# Patient Record
Sex: Female | Born: 1940
Health system: Southern US, Community
[De-identification: ages and names within clinical notes are randomized; demographics above are authoritative.]

## PROBLEM LIST (undated history)

## (undated) ENCOUNTER — Emergency Department (HOSPITAL_COMMUNITY): Disposition: A | Discharge status: Home / Self Care | Payer: Medicare HMO

## (undated) ENCOUNTER — Ambulatory Visit (HOSPITAL_COMMUNITY): Admission: EM | Source: Home / Self Care

## (undated) DIAGNOSIS — N390 Urinary tract infection, site not specified: Secondary | ICD-10-CM

## (undated) DIAGNOSIS — F329 Major depressive disorder, single episode, unspecified: Secondary | ICD-10-CM

## (undated) DIAGNOSIS — C50919 Malignant neoplasm of unspecified site of unspecified female breast: Secondary | ICD-10-CM

## (undated) DIAGNOSIS — E785 Hyperlipidemia, unspecified: Secondary | ICD-10-CM

## (undated) DIAGNOSIS — J189 Pneumonia, unspecified organism: Secondary | ICD-10-CM

## (undated) DIAGNOSIS — F32A Depression, unspecified: Secondary | ICD-10-CM

## (undated) DIAGNOSIS — E538 Deficiency of other specified B group vitamins: Secondary | ICD-10-CM

## (undated) DIAGNOSIS — E559 Vitamin D deficiency, unspecified: Secondary | ICD-10-CM

## (undated) DIAGNOSIS — C801 Malignant (primary) neoplasm, unspecified: Secondary | ICD-10-CM

## (undated) DIAGNOSIS — N343 Urethral syndrome, unspecified: Secondary | ICD-10-CM

## (undated) DIAGNOSIS — F4323 Adjustment disorder with mixed anxiety and depressed mood: Secondary | ICD-10-CM

## (undated) DIAGNOSIS — K8020 Calculus of gallbladder without cholecystitis without obstruction: Secondary | ICD-10-CM

## (undated) DIAGNOSIS — H269 Unspecified cataract: Secondary | ICD-10-CM

## (undated) DIAGNOSIS — E039 Hypothyroidism, unspecified: Secondary | ICD-10-CM

## (undated) DIAGNOSIS — R413 Other amnesia: Secondary | ICD-10-CM

## (undated) DIAGNOSIS — E739 Lactose intolerance, unspecified: Secondary | ICD-10-CM

## (undated) DIAGNOSIS — K219 Gastro-esophageal reflux disease without esophagitis: Secondary | ICD-10-CM

## (undated) DIAGNOSIS — B029 Zoster without complications: Secondary | ICD-10-CM

## (undated) DIAGNOSIS — IMO0001 Reserved for inherently not codable concepts without codable children: Secondary | ICD-10-CM

## (undated) DIAGNOSIS — M7500 Adhesive capsulitis of unspecified shoulder: Secondary | ICD-10-CM

## (undated) DIAGNOSIS — Z923 Personal history of irradiation: Secondary | ICD-10-CM

## (undated) DIAGNOSIS — Z973 Presence of spectacles and contact lenses: Secondary | ICD-10-CM

## (undated) DIAGNOSIS — M8580 Other specified disorders of bone density and structure, unspecified site: Secondary | ICD-10-CM

## (undated) HISTORY — DX: Hyperlipidemia, unspecified: E78.5

## (undated) HISTORY — PX: MULTIPLE TOOTH EXTRACTIONS: SHX2053

## (undated) HISTORY — DX: Major depressive disorder, single episode, unspecified: F32.9

## (undated) HISTORY — DX: Deficiency of other specified B group vitamins: E53.8

## (undated) HISTORY — PX: COLONOSCOPY: SHX174

## (undated) HISTORY — DX: Hypothyroidism, unspecified: E03.9

## (undated) HISTORY — DX: Depression, unspecified: F32.A

## (undated) HISTORY — DX: Lactose intolerance, unspecified: E73.9

## (undated) HISTORY — DX: Adhesive capsulitis of unspecified shoulder: M75.00

## (undated) HISTORY — DX: Adjustment disorder with mixed anxiety and depressed mood: F43.23

## (undated) HISTORY — DX: Vitamin D deficiency, unspecified: E55.9

## (undated) HISTORY — DX: Gastro-esophageal reflux disease without esophagitis: K21.9

## (undated) HISTORY — DX: Malignant (primary) neoplasm, unspecified: C80.1

## (undated) HISTORY — DX: Other specified disorders of bone density and structure, unspecified site: M85.80

## (undated) HISTORY — DX: Zoster without complications: B02.9

## (undated) HISTORY — DX: Urethral syndrome, unspecified: N34.3

## (undated) HISTORY — DX: Other amnesia: R41.3

---

## 1997-09-01 ENCOUNTER — Ambulatory Visit (HOSPITAL_COMMUNITY): Admission: RE | Admit: 1997-09-01 | Discharge: 1997-09-01 | Payer: Self-pay | Admitting: Gynecology

## 1998-03-05 DIAGNOSIS — C801 Malignant (primary) neoplasm, unspecified: Secondary | ICD-10-CM

## 1998-03-05 HISTORY — PX: BREAST LUMPECTOMY: SHX2

## 1998-03-05 HISTORY — DX: Malignant (primary) neoplasm, unspecified: C80.1

## 1998-07-13 ENCOUNTER — Other Ambulatory Visit: Admission: RE | Admit: 1998-07-13 | Discharge: 1998-07-13 | Payer: Self-pay | Admitting: Gynecology

## 1998-09-16 ENCOUNTER — Ambulatory Visit (HOSPITAL_COMMUNITY): Admission: RE | Admit: 1998-09-16 | Discharge: 1998-09-16 | Payer: Self-pay | Admitting: Gynecology

## 1998-09-16 ENCOUNTER — Encounter: Payer: Self-pay | Admitting: Gynecology

## 1998-09-23 ENCOUNTER — Ambulatory Visit (HOSPITAL_COMMUNITY): Admission: RE | Admit: 1998-09-23 | Discharge: 1998-09-23 | Payer: Self-pay | Admitting: Gynecology

## 1998-10-06 ENCOUNTER — Encounter: Payer: Self-pay | Admitting: Surgery

## 1998-10-06 ENCOUNTER — Ambulatory Visit (HOSPITAL_BASED_OUTPATIENT_CLINIC_OR_DEPARTMENT_OTHER): Admission: RE | Admit: 1998-10-06 | Discharge: 1998-10-06 | Payer: Self-pay | Admitting: Surgery

## 1998-10-12 ENCOUNTER — Encounter: Admission: RE | Admit: 1998-10-12 | Discharge: 1999-01-10 | Payer: Self-pay | Admitting: Radiation Oncology

## 1998-10-31 ENCOUNTER — Encounter: Payer: Self-pay | Admitting: Surgery

## 1998-10-31 ENCOUNTER — Ambulatory Visit (HOSPITAL_COMMUNITY): Admission: RE | Admit: 1998-10-31 | Discharge: 1998-10-31 | Payer: Self-pay | Admitting: Surgery

## 1998-12-29 ENCOUNTER — Encounter: Payer: Self-pay | Admitting: *Deleted

## 1998-12-29 ENCOUNTER — Ambulatory Visit (HOSPITAL_COMMUNITY): Admission: RE | Admit: 1998-12-29 | Discharge: 1998-12-29 | Payer: Self-pay | Admitting: *Deleted

## 1999-09-26 ENCOUNTER — Other Ambulatory Visit: Admission: RE | Admit: 1999-09-26 | Discharge: 1999-09-26 | Payer: Self-pay | Admitting: Gynecology

## 1999-09-28 ENCOUNTER — Encounter: Admission: RE | Admit: 1999-09-28 | Discharge: 1999-09-28 | Payer: Self-pay | Admitting: Gynecology

## 1999-09-28 ENCOUNTER — Encounter: Payer: Self-pay | Admitting: Gynecology

## 1999-11-29 ENCOUNTER — Encounter: Admission: RE | Admit: 1999-11-29 | Discharge: 1999-11-29 | Payer: Self-pay | Admitting: Family Medicine

## 1999-11-29 ENCOUNTER — Encounter: Payer: Self-pay | Admitting: Family Medicine

## 2000-10-01 ENCOUNTER — Encounter: Admission: RE | Admit: 2000-10-01 | Discharge: 2000-10-01 | Payer: Self-pay | Admitting: Gynecology

## 2000-10-01 ENCOUNTER — Encounter: Payer: Self-pay | Admitting: Gynecology

## 2000-10-21 ENCOUNTER — Ambulatory Visit (HOSPITAL_COMMUNITY): Admission: RE | Admit: 2000-10-21 | Discharge: 2000-10-21 | Payer: Self-pay | Admitting: Gastroenterology

## 2000-10-21 ENCOUNTER — Encounter (INDEPENDENT_AMBULATORY_CARE_PROVIDER_SITE_OTHER): Payer: Self-pay | Admitting: Specialist

## 2000-11-26 ENCOUNTER — Other Ambulatory Visit: Admission: RE | Admit: 2000-11-26 | Discharge: 2000-11-26 | Payer: Self-pay | Admitting: Gynecology

## 2001-10-09 ENCOUNTER — Encounter: Admission: RE | Admit: 2001-10-09 | Discharge: 2001-10-09 | Payer: Self-pay | Admitting: Gynecology

## 2001-10-09 ENCOUNTER — Encounter: Payer: Self-pay | Admitting: Gynecology

## 2001-12-03 ENCOUNTER — Other Ambulatory Visit: Admission: RE | Admit: 2001-12-03 | Discharge: 2001-12-03 | Payer: Self-pay | Admitting: Gynecology

## 2002-10-13 ENCOUNTER — Encounter: Admission: RE | Admit: 2002-10-13 | Discharge: 2002-10-13 | Payer: Self-pay | Admitting: Gynecology

## 2002-10-13 ENCOUNTER — Encounter: Payer: Self-pay | Admitting: Gynecology

## 2002-12-14 ENCOUNTER — Other Ambulatory Visit: Admission: RE | Admit: 2002-12-14 | Discharge: 2002-12-14 | Payer: Self-pay | Admitting: Gynecology

## 2003-04-28 ENCOUNTER — Encounter: Admission: RE | Admit: 2003-04-28 | Discharge: 2003-04-28 | Payer: Self-pay | Admitting: Family Medicine

## 2003-10-19 ENCOUNTER — Encounter: Admission: RE | Admit: 2003-10-19 | Discharge: 2003-10-19 | Payer: Self-pay | Admitting: Family Medicine

## 2003-10-20 ENCOUNTER — Encounter: Admission: RE | Admit: 2003-10-20 | Discharge: 2003-10-20 | Payer: Self-pay | Admitting: Surgery

## 2004-03-16 ENCOUNTER — Other Ambulatory Visit: Admission: RE | Admit: 2004-03-16 | Discharge: 2004-03-16 | Payer: Self-pay | Admitting: Gynecology

## 2004-07-07 ENCOUNTER — Encounter: Admission: RE | Admit: 2004-07-07 | Discharge: 2004-07-07 | Payer: Self-pay | Admitting: Family Medicine

## 2004-09-19 ENCOUNTER — Encounter: Admission: RE | Admit: 2004-09-19 | Discharge: 2004-09-19 | Payer: Self-pay | Admitting: Family Medicine

## 2004-10-25 ENCOUNTER — Encounter: Admission: RE | Admit: 2004-10-25 | Discharge: 2004-10-25 | Payer: Self-pay | Admitting: Surgery

## 2005-04-17 ENCOUNTER — Other Ambulatory Visit: Admission: RE | Admit: 2005-04-17 | Discharge: 2005-04-17 | Payer: Self-pay | Admitting: Gynecology

## 2005-05-31 ENCOUNTER — Encounter: Admission: RE | Admit: 2005-05-31 | Discharge: 2005-05-31 | Payer: Self-pay | Admitting: Family Medicine

## 2005-07-04 ENCOUNTER — Encounter: Payer: Self-pay | Admitting: Gynecology

## 2005-10-31 ENCOUNTER — Encounter: Admission: RE | Admit: 2005-10-31 | Discharge: 2005-10-31 | Payer: Self-pay | Admitting: Surgery

## 2006-11-27 ENCOUNTER — Encounter: Admission: RE | Admit: 2006-11-27 | Discharge: 2006-11-27 | Payer: Self-pay | Admitting: Surgery

## 2007-07-01 ENCOUNTER — Other Ambulatory Visit: Admission: RE | Admit: 2007-07-01 | Discharge: 2007-07-01 | Payer: Self-pay | Admitting: Internal Medicine

## 2007-07-02 ENCOUNTER — Encounter: Payer: Self-pay | Admitting: Family Medicine

## 2007-12-10 ENCOUNTER — Encounter: Admission: RE | Admit: 2007-12-10 | Discharge: 2007-12-10 | Payer: Self-pay | Admitting: Surgery

## 2007-12-12 ENCOUNTER — Ambulatory Visit: Payer: Self-pay | Admitting: Family Medicine

## 2007-12-12 DIAGNOSIS — K219 Gastro-esophageal reflux disease without esophagitis: Secondary | ICD-10-CM | POA: Insufficient documentation

## 2007-12-12 DIAGNOSIS — F4323 Adjustment disorder with mixed anxiety and depressed mood: Secondary | ICD-10-CM

## 2007-12-12 DIAGNOSIS — E785 Hyperlipidemia, unspecified: Secondary | ICD-10-CM | POA: Insufficient documentation

## 2007-12-12 DIAGNOSIS — E039 Hypothyroidism, unspecified: Secondary | ICD-10-CM | POA: Insufficient documentation

## 2007-12-12 DIAGNOSIS — M7500 Adhesive capsulitis of unspecified shoulder: Secondary | ICD-10-CM | POA: Insufficient documentation

## 2007-12-12 HISTORY — DX: Hypothyroidism, unspecified: E03.9

## 2007-12-12 HISTORY — DX: Adjustment disorder with mixed anxiety and depressed mood: F43.23

## 2007-12-12 HISTORY — DX: Gastro-esophageal reflux disease without esophagitis: K21.9

## 2008-01-12 ENCOUNTER — Ambulatory Visit: Payer: Self-pay | Admitting: Family Medicine

## 2008-01-15 ENCOUNTER — Ambulatory Visit: Payer: Self-pay | Admitting: Family Medicine

## 2008-01-15 DIAGNOSIS — R635 Abnormal weight gain: Secondary | ICD-10-CM | POA: Insufficient documentation

## 2008-01-15 DIAGNOSIS — N390 Urinary tract infection, site not specified: Secondary | ICD-10-CM | POA: Insufficient documentation

## 2008-01-15 LAB — CONVERTED CEMR LAB
AST: 30 units/L (ref 0–37)
Free T4: 0.8 ng/dL (ref 0.6–1.6)
T4, Total: 9.2 ug/dL (ref 5.0–12.5)
TSH: 0.08 microintl units/mL — ABNORMAL LOW (ref 0.35–5.50)

## 2008-03-11 ENCOUNTER — Encounter: Payer: Self-pay | Admitting: Family Medicine

## 2008-06-07 ENCOUNTER — Telehealth: Payer: Self-pay | Admitting: Family Medicine

## 2008-07-01 ENCOUNTER — Ambulatory Visit: Payer: Self-pay | Admitting: Family Medicine

## 2008-07-01 DIAGNOSIS — Z853 Personal history of malignant neoplasm of breast: Secondary | ICD-10-CM | POA: Insufficient documentation

## 2008-07-02 LAB — CONVERTED CEMR LAB
Cholesterol: 180 mg/dL (ref 0–200)
Free T4: 1.2 ng/dL (ref 0.6–1.6)
LDL Cholesterol: 112 mg/dL — ABNORMAL HIGH (ref 0–99)
TSH: 0.04 microintl units/mL — ABNORMAL LOW (ref 0.35–5.50)
Total CHOL/HDL Ratio: 5
VLDL: 29.4 mg/dL (ref 0.0–40.0)

## 2008-10-18 ENCOUNTER — Ambulatory Visit: Payer: Self-pay | Admitting: Family Medicine

## 2008-10-18 LAB — CONVERTED CEMR LAB
Blood in Urine, dipstick: NEGATIVE
Glucose, Urine, Semiquant: NEGATIVE
Ketones, urine, test strip: NEGATIVE
Specific Gravity, Urine: 1.02

## 2008-10-19 ENCOUNTER — Encounter: Payer: Self-pay | Admitting: Family Medicine

## 2008-11-26 ENCOUNTER — Ambulatory Visit: Payer: Self-pay | Admitting: Family Medicine

## 2008-11-26 DIAGNOSIS — M8580 Other specified disorders of bone density and structure, unspecified site: Secondary | ICD-10-CM | POA: Insufficient documentation

## 2008-12-13 ENCOUNTER — Ambulatory Visit (HOSPITAL_COMMUNITY): Admission: RE | Admit: 2008-12-13 | Discharge: 2008-12-13 | Payer: Self-pay | Admitting: Surgery

## 2009-02-11 ENCOUNTER — Ambulatory Visit: Payer: Self-pay | Admitting: Family Medicine

## 2009-02-13 LAB — CONVERTED CEMR LAB
Alkaline Phosphatase: 100 units/L (ref 39–117)
BUN: 16 mg/dL (ref 6–23)
CO2: 30 meq/L (ref 19–32)
Calcium: 10 mg/dL (ref 8.4–10.5)
Chloride: 104 meq/L (ref 96–112)
Free T4: 1.3 ng/dL (ref 0.6–1.6)
HCT: 43.4 % (ref 36.0–46.0)
HDL: 43.8 mg/dL (ref >39.00)
Hemoglobin: 14.7 g/dL (ref 12.0–15.0)
LDL Cholesterol: 106 mg/dL — ABNORMAL HIGH (ref 0–99)
Lymphocytes Relative: 24.3 % (ref 12.0–46.0)
MCV: 95.1 fL (ref 78.0–100.0)
Neutro Abs: 3.8 10*3/uL (ref 1.4–7.7)
Neutrophils Relative %: 64.2 % (ref 43.0–77.0)
Platelets: 207 10*3/uL (ref 150.0–400.0)
Potassium: 4.4 meq/L (ref 3.5–5.1)
RBC: 4.57 M/uL (ref 3.87–5.11)
Sodium: 140 meq/L (ref 135–145)
TSH: 0.04 microintl units/mL — ABNORMAL LOW (ref 0.35–5.50)
Total Bilirubin: 1 mg/dL (ref 0.3–1.2)
Triglycerides: 116 mg/dL (ref 0.0–149.0)
VLDL: 23.2 mg/dL (ref 0.0–40.0)
Vit D, 25-Hydroxy: 60 ng/mL (ref 30–89)

## 2009-02-22 ENCOUNTER — Other Ambulatory Visit: Admission: RE | Admit: 2009-02-22 | Discharge: 2009-02-22 | Payer: Self-pay | Admitting: Family Medicine

## 2009-02-22 ENCOUNTER — Ambulatory Visit: Payer: Self-pay | Admitting: Family Medicine

## 2009-02-24 ENCOUNTER — Encounter: Payer: Self-pay | Admitting: Family Medicine

## 2009-03-01 ENCOUNTER — Encounter: Admission: RE | Admit: 2009-03-01 | Discharge: 2009-03-01 | Payer: Self-pay | Admitting: Family Medicine

## 2009-03-02 ENCOUNTER — Encounter (INDEPENDENT_AMBULATORY_CARE_PROVIDER_SITE_OTHER): Payer: Self-pay | Admitting: *Deleted

## 2009-03-02 LAB — CONVERTED CEMR LAB: Pap Smear: NORMAL

## 2009-04-19 ENCOUNTER — Ambulatory Visit: Payer: Self-pay | Admitting: Family Medicine

## 2009-04-25 LAB — CONVERTED CEMR LAB: Free T4: 1.3 ng/dL (ref 0.6–1.6)

## 2009-04-26 ENCOUNTER — Telehealth: Payer: Self-pay | Admitting: Family Medicine

## 2009-04-26 ENCOUNTER — Encounter: Payer: Self-pay | Admitting: Family Medicine

## 2009-04-26 ENCOUNTER — Observation Stay (HOSPITAL_COMMUNITY): Admission: EM | Admit: 2009-04-26 | Discharge: 2009-04-27 | Payer: Self-pay | Admitting: Emergency Medicine

## 2009-04-27 ENCOUNTER — Encounter: Payer: Self-pay | Admitting: Family Medicine

## 2009-05-02 ENCOUNTER — Ambulatory Visit: Payer: Self-pay | Admitting: Family Medicine

## 2009-05-04 LAB — CONVERTED CEMR LAB: Potassium: 4.4 meq/L (ref 3.5–5.1)

## 2009-06-06 ENCOUNTER — Ambulatory Visit: Payer: Self-pay | Admitting: Family Medicine

## 2009-06-14 LAB — CONVERTED CEMR LAB
Folate: >20 ng/mL
TSH: 2.15 microintl units/mL (ref 0.35–5.50)

## 2009-08-15 ENCOUNTER — Ambulatory Visit: Payer: Self-pay | Admitting: Family Medicine

## 2009-08-15 DIAGNOSIS — R5383 Other fatigue: Secondary | ICD-10-CM | POA: Insufficient documentation

## 2009-08-17 ENCOUNTER — Encounter: Payer: Self-pay | Admitting: Family Medicine

## 2009-08-17 LAB — CONVERTED CEMR LAB
Albumin: 4.2 g/dL (ref 3.5–5.2)
Alkaline Phosphatase: 107 units/L (ref 39–117)
Bilirubin, Direct: 0.1 mg/dL (ref 0.0–0.3)
CO2: 31 meq/L (ref 19–32)
Chloride: 104 meq/L (ref 96–112)
HCT: 42.6 % (ref 36.0–46.0)
Lymphocytes Relative: 25.4 % (ref 12.0–46.0)
Lymphs Abs: 1.8 10*3/uL (ref 0.7–4.0)
MCHC: 35.2 g/dL (ref 30.0–36.0)
MCV: 93.3 fL (ref 78.0–100.0)
Monocytes Relative: 9.1 % (ref 3.0–12.0)
Neutrophils Relative %: 64.3 % (ref 43.0–77.0)
Platelets: 251 10*3/uL (ref 150.0–400.0)
Potassium: 4.2 meq/L (ref 3.5–5.1)
RDW: 13.5 % (ref 11.5–14.6)
Sodium: 145 meq/L (ref 135–145)
Total Protein: 7.1 g/dL (ref 6.0–8.3)
Vitamin B-12: 536 pg/mL (ref 211–911)
WBC: 7.2 10*3/uL (ref 4.5–10.5)

## 2009-08-29 ENCOUNTER — Ambulatory Visit: Payer: Self-pay | Admitting: Family Medicine

## 2009-08-30 ENCOUNTER — Encounter (INDEPENDENT_AMBULATORY_CARE_PROVIDER_SITE_OTHER): Payer: Self-pay | Admitting: *Deleted

## 2009-09-15 ENCOUNTER — Ambulatory Visit: Payer: Self-pay | Admitting: Family Medicine

## 2009-09-16 LAB — CONVERTED CEMR LAB: TSH: 0.44 microintl units/mL (ref 0.35–5.50)

## 2009-11-16 ENCOUNTER — Encounter: Payer: Self-pay | Admitting: Family Medicine

## 2009-12-13 ENCOUNTER — Ambulatory Visit: Payer: Self-pay | Admitting: Family Medicine

## 2009-12-13 DIAGNOSIS — R109 Unspecified abdominal pain: Secondary | ICD-10-CM | POA: Insufficient documentation

## 2009-12-23 ENCOUNTER — Telehealth: Payer: Self-pay | Admitting: Family Medicine

## 2010-01-02 ENCOUNTER — Encounter: Payer: Self-pay | Admitting: Family Medicine

## 2010-01-06 ENCOUNTER — Encounter (INDEPENDENT_AMBULATORY_CARE_PROVIDER_SITE_OTHER): Payer: Self-pay | Admitting: *Deleted

## 2010-03-07 ENCOUNTER — Ambulatory Visit
Admission: RE | Admit: 2010-03-07 | Discharge: 2010-03-07 | Payer: Self-pay | Source: Home / Self Care | Attending: Family Medicine | Admitting: Family Medicine

## 2010-03-07 ENCOUNTER — Encounter: Payer: Self-pay | Admitting: Family Medicine

## 2010-03-07 ENCOUNTER — Other Ambulatory Visit: Payer: Self-pay | Admitting: Family Medicine

## 2010-03-07 LAB — LDL CHOLESTEROL, DIRECT: Direct LDL: 139.9 mg/dL

## 2010-03-07 LAB — RENAL FUNCTION PANEL
Albumin: 3.9 g/dL (ref 3.5–5.2)
BUN: 17 mg/dL (ref 6–23)
CO2: 31 mEq/L (ref 19–32)
Calcium: 9.9 mg/dL (ref 8.4–10.5)
Chloride: 102 mEq/L (ref 96–112)
Creatinine, Ser: 0.8 mg/dL (ref 0.4–1.2)
GFR: 78.89 mL/min (ref >60.00)
Glucose, Bld: 78 mg/dL (ref 70–99)
Phosphorus: 3.7 mg/dL (ref 2.3–4.6)
Potassium: 4.5 mEq/L (ref 3.5–5.1)
Sodium: 140 mEq/L (ref 135–145)

## 2010-03-07 LAB — CBC WITH DIFFERENTIAL/PLATELET
Basophils Absolute: 0 10*3/uL (ref 0.0–0.1)
Basophils Relative: 0.5 % (ref 0.0–3.0)
Eosinophils Absolute: 0.1 10*3/uL (ref 0.0–0.7)
Eosinophils Relative: 1.2 % (ref 0.0–5.0)
HCT: 43.1 % (ref 36.0–46.0)
Hemoglobin: 14.6 g/dL (ref 12.0–15.0)
Lymphocytes Relative: 26.2 % (ref 12.0–46.0)
Lymphs Abs: 2 10*3/uL (ref 0.7–4.0)
MCHC: 34 g/dL (ref 30.0–36.0)
MCV: 93.1 fl (ref 78.0–100.0)
Monocytes Absolute: 0.8 10*3/uL (ref 0.1–1.0)
Monocytes Relative: 10.1 % (ref 3.0–12.0)
Neutro Abs: 4.8 10*3/uL (ref 1.4–7.7)
Neutrophils Relative %: 62 % (ref 43.0–77.0)
Platelets: 264 10*3/uL (ref 150.0–400.0)
RBC: 4.64 Mil/uL (ref 3.87–5.11)
RDW: 13.5 % (ref 11.5–14.6)
WBC: 7.8 10*3/uL (ref 4.5–10.5)

## 2010-03-07 LAB — HEPATIC FUNCTION PANEL
ALT: 22 U/L (ref 0–35)
AST: 31 U/L (ref 0–37)
Albumin: 3.9 g/dL (ref 3.5–5.2)
Alkaline Phosphatase: 101 U/L (ref 39–117)
Bilirubin, Direct: 0.1 mg/dL (ref 0.0–0.3)
Total Bilirubin: 0.8 mg/dL (ref 0.3–1.2)
Total Protein: 7.1 g/dL (ref 6.0–8.3)

## 2010-03-07 LAB — TSH: TSH: 0.21 u[IU]/mL — ABNORMAL LOW (ref 0.35–5.50)

## 2010-03-07 LAB — LIPID PANEL
Cholesterol: 201 mg/dL — ABNORMAL HIGH (ref 0–200)
HDL: 41 mg/dL (ref >39.00)
Total CHOL/HDL Ratio: 5
Triglycerides: 158 mg/dL — ABNORMAL HIGH (ref 0.0–149.0)
VLDL: 31.6 mg/dL (ref 0.0–40.0)

## 2010-03-09 ENCOUNTER — Telehealth: Payer: Self-pay | Admitting: Family Medicine

## 2010-03-13 ENCOUNTER — Encounter
Admission: RE | Admit: 2010-03-13 | Discharge: 2010-03-13 | Payer: Self-pay | Source: Home / Self Care | Attending: Family Medicine | Admitting: Family Medicine

## 2010-03-25 ENCOUNTER — Encounter: Payer: Self-pay | Admitting: Family Medicine

## 2010-04-04 NOTE — Assessment & Plan Note (Signed)
Summary: HOSPITAL FOLLOW UP   Vital Signs:  Patient profile:   70 year old female Height:      63 inches Weight:      153.25 pounds BMI:     27.25 Temp:     98 degrees F oral Pulse rate:   80 / minute Pulse rhythm:   regular BP sitting:   104 / 70  (left arm) Cuff size:   regular  Vitals Entered By: Lewanda Rife LPN (May 02, 2009 3:57 PM)  History of Present Illness: here for hosp f/u was in hosp with acute gastroenteritis and got hypotensive - then confused and memory loss  was hypokalemic and hydrated   CT and MRI of head showed chronic microvasc isch changes nl cbc nl cxr  never had a fever  rev hosp records with pt today  is much better today -- and has been really rough  is still really tired   stomach is still sore in general and with some reflux -- and  is already taking her prilosec  now is starting to eat some bland foods - for example 1/2 sandwhich  had a gravy biscuit  no longer vomiting no more diarrhea since wednesday     Allergies (verified): No Known Drug Allergies  Past History:  Past Medical History: Last updated: 02/22/2009 Cancer, Breast 2000, had lumpectomy.  Radiation x 36, Mammo, no chemo Depression GERD Hyperlipidemia Hypothyroidism Frozen Shoulder Facial frowth at [age] year Osteopenia, BMD 2004, WNL 2008 had shingles - chronic pain - L side of body  frequent dysuria- takes AZO past hx of zoster   Dr. Bernette Redbird, Gastroentrology, s/p Colonoscopy 2006 Dr. Jerrye Noble, Oncologist Dr. Teressa Senter, Ortho  Past Surgical History: Last updated: 07/01/2008 lumpectomy, 2000  Colonoscopy, 2006 ? 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 Eyes:  Denies blurring and eye irritation. ENT:  Denies sore throat. CV:  Denies chest pain or discomfort, palpitations, and shortness of breath with exertion. Resp:  Denies cough, shortness of breath, and wheezing. GI:  Complains of indigestion and loss of appetite; denies abdominal pain, bloody stools, dark tarry stools, nausea, and vomiting. GU:  Denies dysuria and urinary frequency. Derm:  Denies itching, lesion(s), poor wound healing, and rash. Neuro:  Denies headaches, numbness, and tingling. Heme:  Denies abnormal bruising and bleeding.  Physical Exam  General:  Well-developed,well-nourished,in no acute distress; alert,appropriate and cooperative throughout examination Head:  normocephalic, atraumatic, and no abnormalities observed.   Eyes:  vision grossly intact, pupils equal, pupils round, and pupils reactive to light.  no conjunctival pallor, injection or icterus  Mouth:  pharynx pink and moist.   Neck:  supple with full rom and no masses or thyromegally, no JVD or carotid bruit  Lungs:  Normal respiratory effort, chest expands symmetrically. Lungs are clear to auscultation, no crackles or wheezes. Heart:  Normal rate and regular rhythm. S1 and S2 normal without gallop, murmur, click, rub or other extra sounds. Abdomen:  mild epigastric tenderness without rebound or gaurding  nl bs 4 Q  soft, no distention, no masses, no hepatomegaly, and no splenomegaly.  Msk:  no CVA tenderness  Extremities:  No clubbing, cyanosis, edema, or deformity noted with normal full range of motion of all joints.   Neurologic:  gait normal and DTRs symmetrical and normal.  no tremor  Skin:  Intact without suspicious lesions or rashes nl color and turgor  brisk cap refil  no pallor or jaundice  Cervical Nodes:  No lymphadenopathy noted Inguinal Nodes:   No significant adenopathy Psych:  normal affect, talkative and pleasant    Impression & Recommendations:  Problem # 1:  GASTROENTERITIS (ICD-558.9) Assessment New with hosp for dehydration and delerium rev hosp records in detail incl CT and MRI reports  disc plan for slow and gentle return to nl diet - I recommend BRAT currently  also can double up on prilosec for 3 days to calm gastritis  enc to call asap if symptoms worsen or return  K today to make sure hypokalemia is resolved  Orders: Venipuncture (21308) TLB-Potassium (K+) (84132-K)  Complete Medication List: 1)  Simvastatin 40 Mg Tabs (Simvastatin) .... Take one tablet daily 2)  Lexapro 10 Mg Tabs (Escitalopram oxalate) .... Take 1/2  tablet daily 3)  Vitamin C Cr 500 Mg Cr-caps (Ascorbic acid) .... Take one capsule daily 4)  Calcium 1500 Mg Tabs (Calcium carbonate) .... Take one tablet daily 5)  Multivitamins Tabs (Multiple vitamin) .... Take one tablet daily 6)  Cvs Omeprazole 20 Mg Tbec (Omeprazole) .... Take one tablet daily 7)  Fish Oil  .... Take one tablet daily 8)  Levoxyl 50 Mcg Tabs (Levothyroxine sodium) .... Take one tablet by mouth once daily 9)  Vitamin D 2000 Unit Tabs (Cholecalciferol) .... Take one tablet by mouth twice a day  Patient Instructions: 1)  really take it slow with eating and drinking 2)  small sips of clear fluids and bland diet  3)  BRAT diet -- bananas/ rice/ applesauce/ toast -- in small amounts until your stomach feels better  4)  increase your prilosec / omeprazole to two times a day for 3 days  5)  update me if worse or symptoms return 6)  I think that you developed confusion from dehydration and low blood pressure as a result of the GI illness 7)  glad you are feeling better   Current Allergies (reviewed today): No known allergies

## 2010-04-04 NOTE — Letter (Signed)
Summary: Results Follow up Letter  [place] [facility]  [address] [place], [place] [address]   Phone: [phone]  Fax: [phone]    08/30/2009 MRN: [id number]     [facility] [address] [place], [place]  [address]    Dear Ms. Agrusa,  The following are the results of your recent test(s):  Test         Result    Pap Smear:        Normal _____  Not Normal _____ Comments: ______________________________________________________ Cholesterol: LDL(Bad cholesterol):         Your goal is less than:         HDL (Good cholesterol):       Your goal is more than: Comments:  ______________________________________________________ Mammogram:        Normal _____  Not Normal _____ Comments:  ___________________________________________________________________ Hemoccult:        Normal __x___  Not normal _______ Comments:  Repeat in 1 year  _____________________________________________________________________ Other Tests:    We routinely do not discuss normal results over the telephone.  If you desire a copy of the results, or you have any questions about this information we can discuss them at your next office visit.   Sincerely,   Roxy Manns MD

## 2010-04-04 NOTE — Letter (Signed)
Summary: [facility]   Imported By: Lanelle Bal 03/07/2009 13:16:45  _____________________________________________________________________  External Attachment:    Type:   Image     Comment:   External Document

## 2010-04-04 NOTE — Assessment & Plan Note (Signed)
Summary: 3 MONTH FOLLOW UP   Vital Signs:  Patient profile:   70 year old female Height:      63 inches Weight:      156 pounds BMI:     27.73 Temp:     97.9 degrees F oral Pulse rate:   80 / minute Pulse rhythm:   regular BP sitting:   104 / 74  (left arm) Cuff size:   regular  Vitals Entered By: Lewanda Rife LPN (December 13, 2009 10:15 AM) CC: three month f/u   History of Present Illness: here for f/u of depression and hypothyroid   wt is stable  last visit changed lexapro to prozac due to cost  cannot tell a difference  thinks it is handling her depression pretty well   went to Dr Matthias Hughs to talk to him about abd pain-- chronic side pain  has LN in stomach that are swollen --  ? what is the cause -- they are stable  last week there was spot of blood in pants from rectum - with mucous  nl colonoscopy a year ago  he is not watching LN on regular basis     tsh nl in 7/11 after dose change feels just fine with that now   Allergies (verified): No Known Drug Allergies  Past History:  Past Medical History: Last updated: 02/22/2009 Cancer, Breast 2000, had lumpectomy.  Radiation x 36, Mammo, no chemo Depression GERD Hyperlipidemia Hypothyroidism Frozen Shoulder Facial frowth at [age] year Osteopenia, BMD 2004, WNL 2008 had shingles - chronic pain - L side of body  frequent dysuria- takes AZO past hx of zoster   Dr. Bernette Redbird, Gastroentrology, s/p Colonoscopy 2006 Dr. Jerrye Noble, Oncologist Dr. Teressa Senter, Ortho  Past Surgical History: Last updated: 07/01/2008 lumpectomy, 2000  Colonoscopy, 2006 ? 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 Eyes:  Denies blurring and eye irritation. CV:  Denies chest pain or discomfort. Resp:  Denies cough and shortness of breath. GI:  Complains of abdominal pain, bloody stools, and change in bowel habits; denies indigestion, nausea, and vomiting. GU:  Denies hematuria. MS:  Denies joint redness. Derm:  Denies itching, lesion(s), poor wound healing, and rash. Neuro:  Denies headaches, numbness, and tingling. Psych:  mood is ok . Endo:  Denies cold intolerance, excessive thirst, excessive urination, and heat intolerance. Heme:  Denies abnormal bruising and bleeding.  Physical Exam  General:  Well-developed,well-nourished,in no acute distress; alert,appropriate and cooperative throughout examination Head:  normocephalic, atraumatic, and no abnormalities observed.   Eyes:  vision grossly intact, pupils equal, pupils round, and pupils reactive to light.  no conjunctival pallor, injection or icterus  Mouth:  pharynx pink and moist.   Neck:  supple with full rom and no masses or thyromegally, no JVD or carotid bruit  Chest Wall:  No deformities, masses, or tenderness noted. Lungs:  Normal respiratory effort, chest expands symmetrically. Lungs are clear to auscultation, no crackles or wheezes. Heart:  Normal rate and regular rhythm. S1 and S2 normal without gallop, murmur, click, rub or other extra sounds. Abdomen:  Bowel sounds positive,abdomen soft and  non-tender without masses, organomegaly or hernias noted. no rebound or gaurding Msk:  No deformity or scoliosis noted of thoracic or lumbar spine.  no acute joint changes  Pulses:  R and L carotid,radial,femoral,dorsalis pedis and posterior tibial pulses are full and equal bilaterally Extremities:  No clubbing, cyanosis, edema, or deformity noted with normal full range of motion of all joints.   Neurologic:  sensation intact to  light touch, gait normal, and DTRs symmetrical and normal.   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 1 by mouth once daily  Problem # 2:  PELVIC PAIN, LEFT (ICD-789.09) Assessment: New chronic / years of intermittent L pelvic and abd pain  I recommend she f/ u with her GI or new consult for 2nd op- she will let me know what she wants to do  (she has had scant rectal bleed as well times one) per pt hx of enl LN in abd or pelvis? , nl colon, nl [place] in past ?- will send for these also sched f/u for pelvic exam (she needs to check with ins first)  no pain on exam today  Problem # 3:  HYPOTHYROIDISM (ICD-244.9) Assessment: Improved  improved clinically-and with theraputic tsh at last check  no change in dose Her updated medication list for this problem includes:    Levoxyl 75 Mcg Tabs (Levothyroxine sodium) .[name]... Take 1 tablet by mouth once a day  Labs Reviewed: TSH: 0.44 (09/15/2009)   Free T4: 0.7 (06/06/2009)    Chol: 173 (02/11/2009)   HDL: 43.80 (02/11/2009)   LDL: 106 (02/11/2009)   TG: 116.0 (02/11/2009)  Complete Medication List: 1)  Simvastatin 40 Mg Tabs (Simvastatin) .... Take one tablet daily 2)  Vitamin C Cr 500 Mg Cr-caps (Ascorbic acid) .... Take one capsule daily 3)  Calcium 1500 Mg Tabs (Calcium carbonate) .... Take one tablet daily 4)  Multivitamins Tabs (Multiple vitamin)  .... Take one tablet daily 5)  Fish Oil  .... Take one tablet daily 6)  Vitamin D 2000 Unit Tabs (Cholecalciferol) .... Take one tablet by mouth twice a day 7)  Prilosec 20 Mg Cpdr (Omeprazole) .... Take one by mouth daily 8)  Levoxyl 75 Mcg Tabs (Levothyroxine sodium) .... Take 1 tablet by mouth once a day 9)  Prozac 20 Mg Caps (Fluoxetine hcl) .[name].. 1 by mouth once daily  Patient Instructions: 1)  think about whether you want to see a new GI doctor or see Dr Matthias Hughs again and ask new questions  2)  please send for Dr Buccini's last note and Ct scan  3)  continue current medicines  4)  call your insurance company to see how often they cover pap and gyn exam (your last one was 12/29 /2010)  5)  decide whether you want a pap or a gynecologic exam without pap and then call to make an appt (tell the staff member you are having pelvic pain)  Current Allergies (reviewed today): No known allergies

## 2010-04-04 NOTE — Assessment & Plan Note (Signed)
Summary: 1 MTH FU/CLE   Vital Signs:  Patient profile:   70 year old female Height:      63 inches Weight:      156.75 pounds BMI:     27.87 Temp:     97.9 degrees F oral Pulse rate:   80 / minute Pulse rhythm:   regular BP sitting:   100 / 70  (left arm) Cuff size:   regular  Vitals Entered By: Lewanda Rife LPN (September 15, 2009 9:32 AM) CC: one month f/u   History of Present Illness: here for f/u of depression/ fatigue/ hypothyroid / diarrhea   last visit started on lexapro (she declined counseling) thinks that is some better  she knows prozac worked  needs something cheaper - is interested in that  stress level is fair   is getting some exercise -- not as much as she used to 5000 steps now - used to be 10,000 has been really hot    c diff test /other labs ok  that is better now - no more diarrhea or mucous   occ gets a little pain in the side   thyroid needed adj- went up on dose does feel better- a little more energy  is still not succeeding in wt loss, however    wt is stable with good bp  Allergies (verified): No Known Drug Allergies  Past History:  Past Medical History: Last updated: 02/22/2009 Cancer, Breast 2000, had lumpectomy.  Radiation x 36, Mammo, no chemo Depression GERD Hyperlipidemia Hypothyroidism Frozen Shoulder Facial frowth at [age] year Osteopenia, BMD 2004, WNL 2008 had shingles - chronic pain - L side of body  frequent dysuria- takes AZO past hx of zoster   Dr. Bernette Redbird, Gastroentrology, s/p Colonoscopy 2006 Dr. Jerrye Noble, Oncologist Dr. Teressa Senter, Ortho  Past Surgical History: Last updated: 07/01/2008 lumpectomy, 2000  Colonoscopy, 2006 ? 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 Eyes:  Denies blurring and eye irritation. CV:  Denies chest pain or discomfort, palpitations, and swelling of feet. Resp:  Denies cough, shortness of breath, and wheezing. GI:  Denies abdominal pain, change in bowel habits, indigestion, and nausea. MS:  Denies joint pain, joint redness, and joint swelling. Derm:  Denies lesion(s), poor wound healing, and rash. Neuro:  Denies numbness and tingling. Psych:  Complains of anxiety and depression; denies panic attacks, sense of great danger, and suicidal thoughts/plans. Endo:  Denies cold intolerance, excessive thirst, excessive urination, and heat intolerance. Heme:  Denies abnormal bruising and bleeding.  Physical Exam  General:  Well-developed,well-nourished,in no acute distress; alert,appropriate and cooperative throughout examination Head:  normocephalic, atraumatic, and no abnormalities observed.  no sinus or temporal tenderness Mouth:  pharynx pink and moist.   Neck:  supple with full rom and no masses or thyromegally, no JVD or carotid bruit  Chest Wall:  No deformities, masses, or tenderness noted. Lungs:  Normal respiratory effort, chest expands symmetrically. Lungs are clear to auscultation, no crackles or wheezes. Heart:  Normal rate and regular rhythm. S1 and S2 normal without gallop, murmur, click, rub or other extra sounds. Extremities:  No clubbing, cyanosis, edema, or deformity noted with normal full range of motion of all joints.   Neurologic:  sensation intact to light touch, gait normal, and DTRs symmetrical and normal.   Skin:  Intact without suspicious lesions or rashes Cervical Nodes:  No lymphadenopathy noted Psych:  improved affect - much calmer still seems angry about situation with family   Impression &  Recommendations:  Problem # 1:  DEPRESSION (ICD-311) Assessment Improved  this is much imp with lexapro but pt cannot afford it  will change to prozac - which worked for her in past rev poss side eff in detail - red flags to watch for  f/u 3 mo rev stressors- still not interested in counseling The following medications were removed from the medication list:    Lexapro 10 Mg Tabs (Escitalopram oxalate) .[name]... Take 1/2  tablet daily Her updated medication list for this problem includes:    Prozac 20 Mg Caps (Fluoxetine hcl) .[name]... 1 by mouth once daily  Discussed treatment options, including trial of antidpressant medication. Will refer to behavioral health. Follow-up call in in 24-48 hours and recheck in 2 weeks, sooner as needed. Patient agrees to call if any worsening of symptoms or thoughts of doing harm arise. Verified that the patient has no suicidal ideation at this time.   Orders: Prescription Created Electronically [phone])  Problem # 2:  DIARRHEA (ICD-787.91) Assessment: Improved  this is resolved adv to update if symptoms return   Orders: Prescription Created Electronically [phone])  Problem # 3:  HYPOTHYROIDISM (ICD-244.9) Assessment: Improved  on higher dose and feeling better lab and adv  f/u in 3 months  Her updated medication list for this problem includes:    Levoxyl 75 Mcg Tabs (Levothyroxine sodium) .[name]... Take 1 tablet by mouth once a day  Orders: Venipuncture ([id number]) TLB-TSH (Thyroid Stimulating Hormone) [phone]) Prescription Created Electronically [phone])  Labs Reviewed: TSH: 7.41 (08/15/2009)   Free T4: 0.7 (06/06/2009)    Chol: 173 (02/11/2009)   HDL: 43.80 (02/11/2009)   LDL: 106 (02/11/2009)   TG: 116.0 (02/11/2009)  Problem # 4:  WEIGHT GAIN (ICD-783.1) pt still concerned -I think she needs to get back to more exercise and cut portions  will keep me updated   Complete Medication List: 1)  Simvastatin 40 Mg Tabs (Simvastatin) .... Take one  tablet daily 2)  Vitamin C Cr 500 Mg Cr-caps (Ascorbic acid) .... Take one capsule daily 3)  Calcium 1500 Mg Tabs (Calcium carbonate) .... Take one tablet daily 4)  Multivitamins Tabs (Multiple vitamin) .... Take one tablet daily 5)  Fish Oil  .... Take one tablet daily 6)  Vitamin D 2000 Unit Tabs (Cholecalciferol) .... Take one tablet by mouth twice a day 7)  Prilosec 20 Mg Cpdr (Omeprazole) .... Take one by mouth daily 8)  Levoxyl 75 Mcg Tabs (Levothyroxine sodium) .... Take 1 tablet by mouth once a day 9)  Prozac 20 Mg Caps (Fluoxetine hcl) .[name].. 1 by mouth once daily  Patient Instructions: 1)  checking thyroid today 2)  stop lexapro  3)  start prozac 20 mg daily  4)  if any side effects or if you feel more depressed or anxious- let me know  5)  work on 20 minutes of exercise daily at minimum  6)  follow up with me in about 3 months  Prescriptions: PROZAC 20 MG CAPS (FLUOXETINE HCL) 1 by mouth once daily  #30 x 11   Entered and Authorized by:   Colon Flattery  Tower MD   Signed by:   Judith Part MD on 09/15/2009   Method used:   Electronically to        [facility] (retail)       [address]       [place], [place]  [address]       Ph: [phone]       Fax: [phone]   RxID:   [id number]   Current Allergies (reviewed today): No known allergies

## 2010-04-04 NOTE — Progress Notes (Signed)
Summary: does not want 2nd opinion  Phone Note Call from Patient   [name]: Patient Call For: Judith Part MD Summary of Call: Pt called to let you know that she is going to hold off on a second GI opinion. Initial call taken by: Lowella Petties CMA,  December 23, 2009 11:42 AM  Follow-up for Phone Call        thanks for the update if she needs ref to Dr Matthias Hughs for a follow up to ask questions just let me know  Follow-up by: Judith Part MD,  December 23, 2009 12:03 PM  Additional Follow-up for Phone Call Additional follow up Details #1::        Patient notified as instructed by telephone. Lewanda Rife LPN  December 23, 2009 4:43 PM

## 2010-04-04 NOTE — Assessment & Plan Note (Signed)
Summary: levoxyl 75 mcg    Current Allergies: No known allergies Prescriptions: LEVOXYL 75 MCG TABS (LEVOTHYROXINE SODIUM) Take 1 tablet by mouth once a day  #30 x 5   Entered by:   Lewanda Rife LPN   Authorized by:   Judith Part MD   Signed by:   Lewanda Rife LPN on 38/75/6433   Method used:   Electronically to        [facility] (retail)       [address]       [place], [place]  [address]       Ph: [phone]       Fax: [phone]   RxID:   [id number]  Patient notified as instructed by telephone. Pt has f/u apt with Dr Milinda Antis on 09/15/09 at 9:15.Lewanda Rife LPN  August 17, 2009 8:47 AM   Dr Milinda Antis the computer would not let me end update by signing off. Please sign the note. Thank you.Lewanda Rife LPN  August 17, 2009 8:49 AM

## 2010-04-04 NOTE — Letter (Signed)
Summary: Generic Letter  [place] [facility]  [address] [place], [place] [address]   Phone: [phone]  Fax: [phone]    01/06/2010    Bronwen Klimaszewski [address] [place], [place]  [address]     Dear Ms. Krupp,    Your mammogram was normal, please repeat this screening in one year.      Sincerely,   Liane Comber CMA (AAMA)

## 2010-04-04 NOTE — Letter (Signed)
Summary: [facility]: Immunoassay Fecal Occult Blood (iFOB) Order Form  [facility]  [address] [place], [place] [address]   Phone: [phone]  Fax: [phone]      [facility]: Immunoassay Fecal Occult Blood (iFOB) Order Form   August 15, 2009 MRN: [id number]   [facility] [place] 1941/01/17   Physicican Name:____Tower _____________________  Diagnosis Code:______V76.51____________________      Judith Part MD

## 2010-04-04 NOTE — Progress Notes (Signed)
Summary: call an nurse  Phone Note Call from Patient   [name]: Spouse Summary of Call: Triage Record Num: [id number] Operator: Meribeth Mattes Patient Name: Frances Hoffman Call Date & Time: 04/26/2009 6:43:56AM Patient Phone: [phone] PCP: Hannah Beat Patient Gender: Female PCP Fax : [phone] Patient DOB: 1940-10-01 Practice Name: [facility] Reason for Call: Husband calling, wife's V/D onset 2/22, disoriented and memory loss, can not remember what she did Sun or Mon, does not remember her appt for today Tues, walking fine, no weakness, no diff breathing Husband to call EMS 911. Needs to be seen at [facility]. Protocol(s) Used: Neurological Deficits Recommended Outcome per Protocol: Activate EMS 911 Reason for Outcome: New or worsening signs and symptoms that may indicate shock Care Advice:  ~ Do not give the patient anything to eat or drink. Write down provider's name. List or place the following in a bag for transport with the patient: current prescription and/or OTC medications; alternative treatments, therapies and medications; and street drugs.  ~  ~ An adult should stay with the patient, preferably one trained in CPR. Lay the person down and elevate legs at least 12 inches (30 cm) above level of heart. Cover to help maintain body temperature.  ~ 04/26/2009 6:52:04AM Page 1 of 1 CAN_Triage Initial call taken by: Melody Comas,  April 26, 2009 8:54 AM

## 2010-04-04 NOTE — Letter (Signed)
Summary: [facility]   Imported By: Sherian Rein 11/22/2009 14:40:23  _____________________________________________________________________  External Attachment:    Type:   Image     Comment:   External Document

## 2010-04-04 NOTE — Assessment & Plan Note (Signed)
Summary: DEPRESSION/DLO   Vital Signs:  Patient profile:   70 year old female Height:      63 inches Weight:      155.50 pounds BMI:     27.65 Temp:     97.5 degrees F oral Pulse rate:   80 / minute Pulse rhythm:   regular BP sitting:   116 / 72  (left arm) Cuff size:   regular  Vitals Entered By: Lewanda Rife LPN (August 15, 2009 3:05 PM) CC: depression and feels tired   History of Present Illness: feels bad- no energy-- since she was in hospital this winter  bloated throat feels funny - irritated  hot all the time -- this is unusual for her   is worried about cancer in general    L side / abd hurts from time to time  this has gone on for years  [place] was negative , colonosc was neg - but she is still very worried about it   in addition thinks she has been more depressed  gets upset about things that are irrational  this has been going on since she quit taking lexapro  had to stop due to the cost  cannot take the generic - ? celexa - because she had wt gain and side eff  keeps gaining wt and does not eat enough  is exercising every day [id number] steps   took prozac ? one time -- 10-15 years - ? how it worked   had depression for years and years  she does get preoccupied with her health when depressed - very worried   stress - grandson -- worries about him -- he gets mad   is up to date on mam/ pap and also colonosc  does have a lot of mucous in her stool ever since hosp with GI virus          Allergies (verified): No Known Drug Allergies  Past History:  Past Medical History: Last updated: 02/22/2009 Cancer, Breast 2000, had lumpectomy.  Radiation x 36, Mammo, no chemo Depression GERD Hyperlipidemia Hypothyroidism Frozen Shoulder Facial frowth at [age] year Osteopenia, BMD 2004, WNL 2008 had shingles - chronic pain - L side of body  frequent dysuria- takes AZO past hx of zoster   Dr. Bernette Redbird, Gastroentrology, s/p Colonoscopy 2006 Dr.  Jerrye Noble, Oncologist Dr. Teressa Senter, Ortho  Past Surgical History: Last updated: 07/01/2008 lumpectomy, 2000  Colonoscopy, 2006 ? 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 Eyes:  Denies blurring, discharge, and eye irritation. ENT:  Complains of sore throat; denies hoarseness, nasal congestion, and postnasal drainage. CV:  Denies chest pain or discomfort, palpitations, and swelling of feet. Resp:  Denies cough, shortness of breath, and wheezing. GI:  Complains of change in bowel habits; denies abdominal pain, dark tarry stools, nausea, and vomiting. GU:  Denies dysuria and urinary frequency. MS:  Denies joint redness, joint swelling, and cramps. Derm:  Denies itching, lesion(s), poor wound healing, and rash. Neuro:  Denies numbness, tingling, and tremors. Psych:  Complains of anxiety and depression; denies panic attacks, sense of great danger, and suicidal  thoughts/plans. Endo:  Complains of heat intolerance; denies cold intolerance, excessive thirst, and excessive urination. Heme:  Denies abnormal bruising and bleeding.  Physical Exam  General:  Well-developed,well-nourished,in no acute distress; alert,appropriate and cooperative throughout examination Head:  normocephalic, atraumatic, and no abnormalities observed.  no sinus or temporal tenderness Eyes:  vision grossly intact, pupils equal, pupils round, and pupils reactive to light.  no conjunctival pallor, injection or icterus  Ears:  R ear normal and L ear normal.   Nose:  no nasal discharge.     Mouth:  pharynx pink and moist.   Neck:  supple with full rom and no masses or thyromegally, no JVD or carotid bruit  Chest Wall:  No deformities, masses, or tenderness noted. Lungs:  Normal respiratory effort, chest expands symmetrically. Lungs are clear to auscultation, no crackles or wheezes. Heart:  Normal rate and regular rhythm. S1 and S2 normal without gallop, murmur, click, rub or other extra sounds. Abdomen:  Bowel sounds positive,abdomen soft and non-tender without masses, organomegaly or hernias noted. no renal bruits no suprapubic tenderness or fullness felt  Msk:  No deformity or scoliosis noted of thoracic or lumbar spine.  no acute joint changes  Pulses:  R and L carotid,radial,femoral,dorsalis pedis and posterior tibial pulses are full and equal bilaterally Extremities:  no cce  Neurologic:  sensation intact to light touch, gait normal, and DTRs symmetrical and normal.  no tremor  Skin:  Intact without suspicious lesions or rashes Cervical Nodes:  No lymphadenopathy noted Inguinal Nodes:  No significant adenopathy Psych:  pt is quite anxious with rapid speech and questions  frequently interjects questions in the middle of explanations- with some flight of ideas  does not seem to be listening to advice  occ tearful  eye contact is fair  insight seems fair at vest    Impression & Recommendations:  Problem # 1:  FATIGUE (ICD-780.79) Assessment New suspect rel to depression pt is also incredibly worried about cancer or other health problems  check labs  f/u 1 mo  eat more protien  Orders: Venipuncture (30865) TLB-BMP (Basic Metabolic Panel-BMET) (80048-METABOL) TLB-CBC Platelet - w/Differential (85025-CBCD) TLB-Hepatic/Liver Function Pnl (80076-HEPATIC) TLB-TSH (Thyroid Stimulating Hormone) (84443-TSH) TLB-B12 + Folate Pnl (78469_62952-W41/LKG)  Problem # 2:  DIARRHEA (ICD-787.91) Assessment: New mucous in stool  was hosp this winter- will check c diff  test also start yogurt for probiotic effect  also stool card for screening  Orders: Venipuncture (40102) T-Culture, C-Diff Toxin A/B ([id number])  Problem # 3:  WEIGHT GAIN (ICD-783.1) Assessment: Unchanged pt worried about this -- will check labs  suspect not actually eating enough or any protien rev diet today enc exercise   Problem # 4:  HYPOTHYROIDISM (ICD-244.9) Assessment: Unchanged  lab today in light of fatigue no hair loss or skin changes  Her updated medication list for this problem includes:    Levoxyl 50 Mcg Tabs (Levothyroxine sodium) .[name]... Take one tablet by mouth once daily  Labs Reviewed: TSH: 2.15 (06/06/2009)   Free T4: 0.7 (06/06/2009)    Chol: 173 (02/11/2009)   HDL: 43.80 (02/11/2009)   LDL: 106 (02/11/2009)   TG: 116.0 (02/11/2009)  Problem # 5:  DEPRESSION (ICD-311) Assessment: Deteriorated  this is much worse off med and creating excessive worry about health/ cancer and causing physical symptoms I suspect  spent 25 minutes face to face time with pt , over 50% of which was spent on counseling and coordination of care   start back on lexapro declines counseling  f/u 1 mo Her updated medication list for this problem includes:    Lexapro 10 Mg Tabs (Escitalopram oxalate) .[name]... Take 1/2  tablet daily  Orders: Prescription Created Electronically [phone])  Problem # 6:  DRY MOUTH (ICD-527.7) Assessment: New  with irritated throat but well controlled reflux disc poss of candida will tx with nystatin mouthwash and update  Orders: Prescription Created Electronically [phone])  Complete Medication List: 1)  Simvastatin 40 Mg Tabs (Simvastatin) .... Take one tablet daily 2)  Lexapro 10 Mg Tabs (Escitalopram oxalate) .... Take 1/2  tablet daily 3)  Vitamin C Cr 500 Mg Cr-caps (Ascorbic acid) .... Take one capsule daily 4)  Calcium 1500 Mg Tabs (Calcium carbonate) .... Take one tablet daily 5)  Multivitamins Tabs (Multiple vitamin) .... Take one tablet  daily 6)  Fish Oil  .... Take one tablet daily 7)  Levoxyl 50 Mcg Tabs (Levothyroxine sodium) .... Take one tablet by mouth once daily 8)  Vitamin D 2000 Unit Tabs (Cholecalciferol) .... Take one tablet by mouth twice a day 9)  Prilosec 20 Mg Cpdr (Omeprazole) .... Take one by mouth daily 10)  Nystatin 100000 Unit/ml Susp (Nystatin) .[name].. 1 teaspoon swish and swallow three times a day  Patient Instructions: 1)  get protein with every single meal - lean meats/ nuts/ low fat dairy/ soy / tofu  2)  keep up the exercise  3)  try the nystatin mouthwash for your mouth symptoms  4)  labs today for fatigue  5)  stool test for c diff today  6)  eat one serving of yogurt with natural cultures daily -- this helps restore the good bacterial balance in your GI system  7)  I will update you with results 8)  start back on lexapro 1/2 pill daily  9)  let me know if you think you need counseling  10)  follow up with me in 1 month  Prescriptions: NYSTATIN 100000 UNIT/ML SUSP (NYSTATIN) 1 teaspoon swish and swallow three times a day  #120cc x 0   Entered and Authorized by:   Judith Part MD   Signed by:   Judith Part MD on 08/15/2009   Method used:   Electronically to        [facility] (retail)       [address]       [place], [place]  [address]       Ph: [phone]       Fax: [phone]   RxID:   [id number] LEXAPRO 10 MG TABS (ESCITALOPRAM OXALATE) take 1/2  tablet daily  #15 x 11   Entered and Authorized by:   Judith Part MD   Signed by:   Judith Part MD on 08/15/2009   Method used:   Electronically to        [facility] [address] (retail)       [address]       [place], [place]  [address]       Ph: [phone]       Fax: [phone]   RxID:   [id number]   Current Allergies (reviewed today): No known allergies

## 2010-04-04 NOTE — Assessment & Plan Note (Signed)
Summary: f/u per Dr. Fabio Pierce   Vital Signs:  Patient profile:   70 year old female Height:      63 inches Weight:      154.25 pounds BMI:     27.42 Temp:     98 degrees F oral Pulse rate:   80 / minute Pulse rhythm:   regular BP sitting:   108 / 68  (left arm) Cuff size:   regular  Vitals Entered By: Lewanda Rife LPN (June 06, 1608 11:07 AM) CC: follow-up visit   History of Present Illness: here for f/u ofthyroid   in feb - her tsh was low at .04 we decreased her supplement to 50 micrograms from 75 needs labs today has felt more hungry  no other clinical changes no energy changes  is sleeping ok   has cut her lexapro to 1/2 - and doing well  plans to keep weaning   doing some walking -working up to 10,000 steps   use to take synthroid - and was changed to levoxyl -- ? if this is different since she has had to change dose does not feel any different    getting over gastroenteritis  mouth tastes bad and her tougue burns a bit   has recurrent problem with bladder pain -- without any infection  usually azo one day stops it  eating pinapple and acidic juices  no symptoms at all today  no artificial sweetener   is wondering if her prilosec could cause B12 def -- does have a bit of neuropathy symptoms    Allergies (verified): No Known Drug Allergies  Past History:  Past Medical History: Last updated: 02/22/2009 Cancer, Breast 2000, had lumpectomy.  Radiation x 36, Mammo, no chemo Depression GERD Hyperlipidemia Hypothyroidism Frozen Shoulder Facial frowth at [age] year Osteopenia, BMD 2004, WNL 2008 had shingles - chronic pain - L side of body  frequent dysuria- takes AZO past hx of zoster   Dr. Bernette Redbird, Gastroentrology, s/p Colonoscopy 2006 Dr. Jerrye Noble, Oncologist Dr. Teressa Senter, Ortho  Past Surgical History: Last updated: 07/01/2008 lumpectomy, 2000  Colonoscopy, 2006 ? 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 Eyes:  Denies blurring. ENT:  Denies postnasal drainage, sinus pressure, and sore throat. CV:  Denies chest pain or discomfort and palpitations. Resp:  Denies cough and wheezing. GI:  Denies abdominal pain, bloody stools, change in bowel habits, diarrhea, indigestion, loss of appetite, nausea, and vomiting. GU:  Complains of dysuria; denies hematuria and urinary frequency; dysuria is resolved today. Derm:  Denies itching, lesion(s), poor wound healing, and rash. Neuro:  Denies numbness and tingling. Psych:  mood is ok . Endo:  Denies cold intolerance and heat intolerance. Heme:  Denies abnormal bruising and bleeding. Allergy:  Complains of seasonal allergies.  Physical Exam  General:  Well-developed,well-nourished,in no acute distress; alert,appropriate and cooperative throughout examination Head:  normocephalic, atraumatic, and no abnormalities observed.   Eyes:  vision grossly intact, pupils equal, pupils round, and pupils reactive to light.  no conjunctival pallor, injection or icterus  Nose:  no nasal discharge.   Mouth:  pharynx pink and moist.   no mouth lesions or thrush noted  Neck:  supple with full rom and no masses or thyromegally, no JVD or carotid bruit  thyroid is symmetric and nt  Chest Wall:  No deformities, masses, or tenderness noted. Lungs:  Normal respiratory effort, chest expands symmetrically. Lungs are clear to auscultation, no crackles or wheezes. Heart:  Normal rate and regular rhythm. S1 and S2 normal without gallop, murmur,  click, rub or other extra sounds. Abdomen:  no suprapubic tenderness or fullness felt  Msk:  no CVA tenderness  Extremities:  no cce  Neurologic:  strength normal in all extremities, sensation intact to light touch, gait normal, and DTRs symmetrical and normal.  no tremor  Skin:  Intact without suspicious lesions or rashes Cervical Nodes:  No lymphadenopathy noted Psych:  normal affect, talkative and pleasant    Impression & Recommendations:  Problem # 1:  HYPOTHYROIDISM (ICD-244.9) Assessment Deteriorated with steadily inc tsh anddec dose no clinical changes except hunger  lab today and update  if pt pref DAW will let me know  Her updated medication list for this problem includes:    Levoxyl 50 Mcg Tabs (Levothyroxine sodium) .[name]... Take one tablet by mouth once daily  Orders: Venipuncture (16109) TLB-TSH (Thyroid Stimulating Hormone) (84443-TSH) TLB-T4 (Thyrox), Free [id number])  Problem # 2:  ADVERSE DRUG REACTION (ICD-995.20) Assessment: New in light of GERd and ongoing use of PPI- interested in checking B12 level -- this can be aff by lower acidity in stomach some neuropathy sympt intermittently  wlill check B12 and folate today Orders: Venipuncture (19147) TLB-B12 + Folate Pnl ([id number]/QIO)  Problem # 3:  OTHER SYMPTOMS INVOLVING URINARY SYSTEM (ICD-788.99) Assessment: New intermittent (gone today )-may have to do with acidic juices and fruits  adv to avoid these / caff and art sweetner f/u is not imp  Complete Medication List: 1)  Simvastatin 40 Mg Tabs (Simvastatin) .... Take one tablet daily 2)  Lexapro 10 Mg Tabs (Escitalopram oxalate) .... Take 1/2  tablet daily 3)  Vitamin C Cr 500 Mg Cr-caps (Ascorbic acid) .... Take one capsule daily 4)  Calcium 1500 Mg Tabs (Calcium carbonate) .... Take one tablet daily 5)  Multivitamins Tabs (Multiple vitamin) .... Take one tablet daily 6)  Fish Oil  .... Take one tablet daily 7)  Levoxyl 50 Mcg Tabs  (Levothyroxine sodium) .... Take one tablet by mouth once daily 8)  Vitamin D 2000 Unit Tabs (Cholecalciferol) .... Take one tablet by mouth twice a day 9)  Prilosec 20 Mg Cpdr (Omeprazole) .... Take one by mouth daily  Patient Instructions: 1)  if at any point you want to go back to brand name synthroid -- just call and let me know  2)  labs today and will update you 3)  also checking B12 level  4)  avoid acidic juice/ beverages/ caffiene/ art sweetener and update me if bladder prin does not resolve   Current Allergies (reviewed today): No known allergies

## 2010-04-04 NOTE — Letter (Signed)
Summary: [facility]   Imported By: Beau Fanny 05/03/2009 14:27:30  _____________________________________________________________________  External Attachment:    Type:   Image     Comment:   External Document

## 2010-04-06 NOTE — Assessment & Plan Note (Signed)
Summary: CPX   Vital Signs:  Patient profile:   70 year old female Height:      63 inches Weight:      157.50 pounds BMI:     28.00 Temp:     98 degrees F oral Pulse rate:   76 / minute Pulse rhythm:   regular BP sitting:   100 / 68  (left arm) Cuff size:   regular  Vitals Entered By: Lewanda Rife LPN (March 07, 2010 10:46 AM) CC: CPX LMP 20 yrs ago   History of Present Illness: here for f/u of chronic medical problems and also to review health mt list  has been feeling ok in general -- but thinks her depression has become worse  has had depression on lexapro too - but not as bad  does not want to see a psychiatrist or counselor  tremendous stress -- some sick family    wt is up 1 lb with bmi of 28  bp 100/68  pap 12/10 normal - no abn pap  L pelvic pain - not happening just as much  wants pelvic exam today -- without pap   mam 10/11 nl past hx of b ca years ago- no changes on self exams  L breast is sometimes sore  surgeon signed off -- has been 11 years since her cancer   colonosc 1/10 saw Dr Matthias Hughs who is not concerned about stable mesenteric adenop  flu shot -- oct  ptx 2010 had shingles many years ago- not interested yet   lipids due on statin  tsh due - was ok in summer   Allergies (verified): No Known Drug Allergies  Past History:  Past Medical History: Last updated: 02/22/2009 Cancer, Breast 2000, had lumpectomy.  Radiation x 36, Mammo, no chemo Depression GERD Hyperlipidemia Hypothyroidism Frozen Shoulder Facial frowth at ten year Osteopenia, BMD 2004, WNL 2008 had shingles - chronic pain - L side of body  frequent dysuria- takes AZO past hx of zoster   Dr. Bernette Redbird, Gastroentrology, s/p Colonoscopy 2006 Dr. Jerrye Noble, Oncologist Dr. Teressa Senter, Ortho  Past Surgical History: Last updated: 07/01/2008 lumpectomy, 2000  Colonoscopy, 2006 ? 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 Eyes:  Denies blurring and eye irritation. CV:  Denies chest pain or discomfort, lightheadness, palpitations, and shortness of breath with exertion. Resp:  Denies cough and wheezing. GI:  Denies abdominal pain, bloody stools, change in bowel habits, and indigestion. GU:  Denies hematuria. MS:  Denies stiffness. Neuro:  Complains of headaches, numbness, and tingling; sometimes bottom of feet hurt. Psych:  Complains of anxiety, depression, and easily tearful; denies panic attacks, sense of great danger, and suicidal thoughts/plans. Endo:  Denies cold intolerance, excessive thirst, excessive urination, and heat intolerance. Heme:  Denies abnormal bruising and bleeding.  Physical Exam  General:  mildly overwt and well appearing  Head:  normocephalic, atraumatic, and no abnormalities observed.   Eyes:  vision grossly intact, pupils equal, pupils round, and pupils reactive to light.  no conjunctival pallor, injection or icterus  Ears:  R ear normal and L ear normal.  Nose:  no nasal discharge.   Mouth:  pharynx pink and moist.   Neck:  supple with full rom and no masses or thyromegally, no JVD or carotid bruit  Chest Wall:  No deformities, masses, or tenderness noted. Breasts:  No mass, nodules, thickening, tenderness, bulging, retraction, inflamation, nipple discharge or skin changes noted.   surgical changes noted on L breast  Lungs:  Normal respiratory effort, chest expands symmetrically. Lungs are clear to auscultation, no crackles or wheezes. Heart:  Normal rate and regular rhythm. S1  and S2 normal without gallop, murmur, click, rub or other extra sounds. Abdomen:  Bowel sounds positive,abdomen soft and non-tender without masses, organomegaly or hernias noted. no renal bruits  Genitalia:  normal introitus, no external lesions, no vaginal discharge, mucosa pink and moist, no vaginal or cervical lesions, and no friaility or hemorrhage.   some tenderness on L sided bimanual exam without mass felt Msk:  No deformity or scoliosis noted of thoracic or lumbar spine.  no acute joint changes  Pulses:  R and L carotid,radial,femoral,dorsalis pedis and posterior tibial pulses are full and equal bilaterally Extremities:  No clubbing, cyanosis, edema, or deformity noted with normal full range of motion of all joints.   Neurologic:  sensation intact to light touch, gait normal, and DTRs symmetrical and normal.   Skin:  Intact without suspicious lesions or rashes some lentigos Cervical Nodes:  No lymphadenopathy noted Axillary Nodes:  No palpable lymphadenopathy Inguinal Nodes:  No significant adenopathy Psych:  seems down today nl eye contact and comm skills   Impression & Recommendations:  Problem # 1:  ROUTINE GYNECOLOGICAL EXAMINATION (ICD-V72.31) Assessment Comment Only gyn exam done in light of hx of pelvic pain no pap - was nl in 2010 will order pelvic US for tenderness on L sided bimanual exam (no mass felt)  Problem # 2:  OSTEOPENIA (ICD-733.90) Assessment: Unchanged is up to date on dexa on ca and D disc imp of wt bearing exercise check D level today  Her updated medication list for this problem includes:    Calcium 1500 Mg Tabs (Calcium carbonate) .[name]... Take one tablet daily    Vitamin D 2000 Unit Tabs (Cholecalciferol) .[name]... Take one tablet by mouth twice a day  Orders: Venipuncture (16109) TLB-Lipid Panel (80061-LIPID) TLB-Hepatic/Liver Function Pnl (80076-HEPATIC) TLB-TSH (Thyroid Stimulating Hormone) (84443-TSH) TLB-Renal Function Panel  (80069-RENAL) T-Vitamin D (25-Hydroxy) (60454-09811) TLB-CBC Platelet - w/Differential (85025-CBCD) Specimen Handling (91478)  Problem # 3:  NEOPLASM, MALIGNANT, BREAST, HX OF (ICD-V10.3) Assessment: Comment Only nl mam  exam today normal   Problem # 4:  DEPRESSION (ICD-311) Assessment: Deteriorated  will go ahead and change back to lexapro as she did better with that  if not imp- I suggested counseling  ? some old issues to resolve (? resentment or anger) The following medications were removed from the medication list:    Prozac 20 Mg Caps (Fluoxetine hcl) .[name]... 1 by mouth once daily Her updated medication list for this problem includes:    Lexapro 10 Mg Tabs (Escitalopram oxalate) .[name]... 1 by mouth once daily  Orders: Prescription Created Electronically [phone]  Problem # 5:  GERD (ICD-530.81) Assessment: Unchanged well controlled with prilosec- no change  The following medications were removed from the medication list:    Prilosec 20 Mg Cpdr (Omeprazole) .[name]... Take one by mouth daily  Orders: Venipuncture (13086) TLB-Lipid Panel (80061-LIPID) TLB-Hepatic/Liver Function Pnl (80076-HEPATIC) TLB-TSH (Thyroid Stimulating Hormone) (84443-TSH) TLB-Renal Function Panel (80069-RENAL) T-Vitamin D (25-Hydroxy) (57846-96295) TLB-CBC Platelet -  w/Differential (85025-CBCD)  Complete Medication List: 1)  Simvastatin 40 Mg Tabs (Simvastatin) .... Take one tablet daily 2)  Vitamin C Cr 500 Mg Cr-caps (Ascorbic acid) .... Take one capsule daily 3)  Calcium 1500 Mg Tabs (Calcium carbonate) .... Take one tablet daily 4)  Multivitamins Tabs (Multiple vitamin) .... Take one tablet daily 5)  Fish Oil  .... Take one tablet daily 6)  Vitamin D 2000 Unit Tabs (Cholecalciferol) .... Take one tablet by mouth twice a day 7)  Levoxyl 75 Mcg Tabs (Levothyroxine sodium) .... Take 1 tablet by mouth once a day 8)  Lexapro 10 Mg Tabs (Escitalopram oxalate) .[name].. 1 by mouth once daily  Other  Orders: Radiology Referral (Radiology)  Patient Instructions: 1)  change back to lexapro from the prozac  2)  lets consider counseling if depression does not improve  3)  labs today  4)  try to eat a healthy diet and exercise  5)  we will schedule pelvic ultrasound at check out  6)  follow with me in about 6 months  Prescriptions: LEVOXYL 75 MCG TABS (LEVOTHYROXINE SODIUM) Take 1 tablet by mouth once a day  #30 x 11   Entered and Authorized by:   Judith Part MD   Signed by:   Judith Part MD on 03/07/2010   Method used:   Electronically to        [facility] (retail)       [address]       [place], [place]  [address]       Ph: [phone]       Fax: [phone]   RxID:   [id number] SIMVASTATIN 40 MG TABS (SIMVASTATIN) take one tablet daily  #90 x 3   Entered and Authorized by:   Judith Part MD   Signed by:   Judith Part MD on 03/07/2010   Method used:   Electronically to        [facility] (retail)       [address]       [place], [place]  [address]       Ph: [phone]       Fax: [phone]   RxID:   [id number] LEXAPRO 10 MG TABS (ESCITALOPRAM OXALATE) 1 by mouth once daily  #30 x 11   Entered and Authorized by:   Judith Part MD   Signed by:   Judith Part MD on 03/07/2010   Method used:   Electronically to        [facility] (retail)       [address]       [place], [place]  [address]       Ph: [phone]       Fax: [phone]   RxID:   [id number]    Orders Added: 1)  Venipuncture [03500] 2)  TLB-Lipid Panel [80061-LIPID] 3)  TLB-Hepatic/Liver Function Pnl [80076-HEPATIC] 4)  TLB-TSH (Thyroid Stimulating Hormone) [84443-TSH] 5)  TLB-Renal Function Panel [80069-RENAL] 6)  T-Vitamin D (25-Hydroxy) [93818-29937] 7)  TLB-CBC Platelet - w/Differential [85025-CBCD] 8)  Specimen Handling [99000] 9)  Radiology Referral  [Radiology] 10)  Est. Patient Level IV [16967] 11)  Prescription Created Electronically [G8553]   Immunization History:  Influenza Immunization History:    Influenza:  fluvax 3+ (12/06/2009)  Immunization History:  Influenza Immunization History:    Influenza:  Fluvax 3+ (12/06/2009)  Current Allergies (reviewed today): No known allergies

## 2010-04-06 NOTE — Progress Notes (Signed)
Summary: Pt wants to change to Synthroid  Phone Note Call from Patient Call back at [phone]   [name]: Patient Call For: Judith Part MD Summary of Call: Since Levoxyl needed to be decreased again to , pt wonders if Dr Milinda Antis thinks it would be a good idea to go back to Synthroid instead of Levoxyl. Pt would prefer to take Synthroid. Pt uses [facility] as pharmacy if needed.Please advise.  Initial call taken by: Lewanda Rife LPN,  March 09, 2010 10:17 AM  Follow-up for Phone Call        that is fine - not much difference between the 2 I will change on emr  px written on EMR for call in  Follow-up by: Judith Part MD,  March 09, 2010 12:53 PM  Additional Follow-up for Phone Call Additional follow up Details #1::        Patient notified as instructed by telephone. Medication phoned to [facility] as instructed. Lewanda Rife LPN  March 09, 2010 3:03 PM     New/Updated Medications: SYNTHROID 50 MCG TABS (LEVOTHYROXINE SODIUM) 1 by mouth once daily Prescriptions: SYNTHROID 50 MCG TABS (LEVOTHYROXINE SODIUM) 1 by mouth once daily  #30 x 11   Entered and Authorized by:   Judith Part MD   Signed by:   Lewanda Rife LPN on 11/91/4782   Method used:   Telephoned to ...       [facility]       [address]       [place], [place]  [address]       Ph: [phone]       Fax: [phone]   RxID:   [id number]

## 2010-05-05 ENCOUNTER — Ambulatory Visit: Payer: Self-pay

## 2010-05-05 ENCOUNTER — Other Ambulatory Visit: Payer: Self-pay | Admitting: Family Medicine

## 2010-05-05 ENCOUNTER — Other Ambulatory Visit (INDEPENDENT_AMBULATORY_CARE_PROVIDER_SITE_OTHER): Payer: Medicare HMO

## 2010-05-05 ENCOUNTER — Encounter (INDEPENDENT_AMBULATORY_CARE_PROVIDER_SITE_OTHER): Payer: Self-pay | Admitting: *Deleted

## 2010-05-05 DIAGNOSIS — E039 Hypothyroidism, unspecified: Secondary | ICD-10-CM

## 2010-05-05 DIAGNOSIS — E785 Hyperlipidemia, unspecified: Secondary | ICD-10-CM

## 2010-05-05 LAB — LIPID PANEL
Total CHOL/HDL Ratio: 5
Triglycerides: 185 mg/dL — ABNORMAL HIGH (ref 0.0–149.0)

## 2010-05-05 LAB — LDL CHOLESTEROL, DIRECT: Direct LDL: 138.5 mg/dL

## 2010-05-05 LAB — AST: AST: 29 U/L (ref 0–37)

## 2010-05-05 LAB — ALT: ALT: 19 U/L (ref 0–35)

## 2010-05-09 ENCOUNTER — Other Ambulatory Visit: Payer: Self-pay

## 2010-05-25 LAB — COMPREHENSIVE METABOLIC PANEL
AST: 33 U/L (ref 0–37)
Albumin: 4.1 g/dL (ref 3.5–5.2)
BUN: 12 mg/dL (ref 6–23)
Creatinine, Ser: 0.84 mg/dL (ref 0.4–1.2)
GFR calc Af Amer: >60 mL/min (ref >60)
Potassium: 4 mEq/L (ref 3.5–5.1)
Total Protein: 7.4 g/dL (ref 6.0–8.3)

## 2010-05-25 LAB — CBC
HCT: 36.9 % (ref 36.0–46.0)
HCT: 45.6 % (ref 36.0–46.0)
MCV: 91.9 fL (ref 78.0–100.0)
MCV: 92.4 fL (ref 78.0–100.0)
Platelets: 157 10*3/uL (ref 150–400)
Platelets: 187 10*3/uL (ref 150–400)
RDW: 13 % (ref 11.5–15.5)
RDW: 13.1 % (ref 11.5–15.5)
WBC: 4.8 10*3/uL (ref 4.0–10.5)

## 2010-05-25 LAB — URINALYSIS, ROUTINE W REFLEX MICROSCOPIC
Bilirubin Urine: NEGATIVE
Glucose, UA: NEGATIVE mg/dL
Ketones, ur: NEGATIVE mg/dL
pH: 7.5 (ref 5.0–8.0)

## 2010-05-25 LAB — DIFFERENTIAL
Eosinophils Absolute: 0 10*3/uL (ref 0.0–0.7)
Eosinophils Relative: 0 % (ref 0–5)
Eosinophils Relative: 0 % (ref 0–5)
Lymphocytes Relative: 6 % — ABNORMAL LOW (ref 12–46)
Lymphs Abs: 0.6 10*3/uL — ABNORMAL LOW (ref 0.7–4.0)
Lymphs Abs: 0.7 10*3/uL (ref 0.7–4.0)
Monocytes Absolute: 0.5 10*3/uL (ref 0.1–1.0)

## 2010-05-25 LAB — BASIC METABOLIC PANEL
BUN: 14 mg/dL (ref 6–23)
Chloride: 104 mEq/L (ref 96–112)
Glucose, Bld: 103 mg/dL — ABNORMAL HIGH (ref 70–99)
Potassium: 3.4 mEq/L — ABNORMAL LOW (ref 3.5–5.1)

## 2010-07-21 ENCOUNTER — Encounter (HOSPITAL_BASED_OUTPATIENT_CLINIC_OR_DEPARTMENT_OTHER)
Admission: RE | Admit: 2010-07-21 | Discharge: 2010-07-21 | Disposition: A | Discharge status: Home / Self Care | Payer: Medicare HMO | Source: Ambulatory Visit | Attending: Specialist | Admitting: Specialist

## 2010-07-21 LAB — DIFFERENTIAL
Basophils Absolute: 0 10*3/uL (ref 0.0–0.1)
Basophils Relative: 0 % (ref 0–1)
Eosinophils Absolute: 0.1 10*3/uL (ref 0.0–0.7)
Eosinophils Relative: 1 % (ref 0–5)
Lymphocytes Relative: 25 % (ref 12–46)
Lymphs Abs: 1.9 10*3/uL (ref 0.7–4.0)
Monocytes Absolute: 0.7 10*3/uL (ref 0.1–1.0)
Monocytes Relative: 9 % (ref 3–12)
Neutro Abs: 4.7 10*3/uL (ref 1.7–7.7)
Neutrophils Relative %: 65 % (ref 43–77)

## 2010-07-21 LAB — CBC
MCH: 32 pg (ref 26.0–34.0)
MCHC: 35.5 g/dL (ref 30.0–36.0)
MCV: 90.1 fL (ref 78.0–100.0)
Platelets: 252 10*3/uL (ref 150–400)
RDW: 13 % (ref 11.5–15.5)
WBC: 7.4 10*3/uL (ref 4.0–10.5)

## 2010-07-21 LAB — BASIC METABOLIC PANEL
BUN: 15 mg/dL (ref 6–23)
Calcium: 9.9 mg/dL (ref 8.4–10.5)
Creatinine, Ser: 0.75 mg/dL (ref 0.4–1.2)
GFR calc non Af Amer: >60 mL/min (ref >60)

## 2010-07-21 NOTE — Procedures (Signed)
[facility]  Patient:    Frances Hoffman, Frances Hoffman Visit Number: [id number] MRN: [id number]          Service Type: END Location: ENDO Attending Physician:  Rich Brave Proc. Date: 10/21/00 Adm. Date:  [id number]   CC:         Sheppard Plumber. Earlene Plater, M.D.  Carolyne Fiscal, M.D.   Procedure Report  PROCEDURE:  Colonoscopy with biopsy.  INDICATIONS FOR PROCEDURE:  A 70 year old female with intermittent rectal bleeding.  FINDINGS:  Diminutive sessile polyp above the cecum. Probable sigmoid diverticulosis.  DESCRIPTION OF PROCEDURE:  The nature, purpose and risk of the procedure had been reviewed with the patient who provided written consent. Sedation was fentanyl 100 mcg and Versed 10 mg IV without arrhythmias or desaturation. The Olympus adjustable tension pediatric video colonoscope was advanced to the cecum without difficulty and pullback was then performed.  Just above the cecum was a 3 mm bump with seemingly normal overlying mucosa. Several cold biopsies were obtained of this in the event that it is an early sessile polyp. No other polyps were identified anywhere else in the colon and there was no evidence of cancer, colitis or vascular malformations. During insertion of the scope, there appeared to be some diverticular change in the sigmoid region although this really was not appreciated during extraction of the scope.  Retroflexion of the rectum showed hypertrophied anal papillae.  No definite source of intermittent rectal bleeding was identified during this exam but it is presumed to be of hemorrhoidal origin based on the absence of alternative causes.  IMPRESSION: 1. Possible early diminutive sessile polyp in the proximal ascending colon. 2. Probable sigmoid diverticular disease. 3. Intermittent rectal bleeding most likely of hemorrhoidal origin.  PLAN:  Await pathology. Consider flexible sigmoidoscopy or colonoscopy for screening  purposes in five years. DD:  10/21/00 TD:  10/22/00 Job: [id number]

## 2010-07-24 ENCOUNTER — Ambulatory Visit (HOSPITAL_BASED_OUTPATIENT_CLINIC_OR_DEPARTMENT_OTHER)
Admission: RE | Admit: 2010-07-24 | Discharge: 2010-07-24 | Disposition: A | Discharge status: Home / Self Care | Payer: Medicare HMO | Source: Ambulatory Visit | Attending: Specialist | Admitting: Specialist

## 2010-07-24 ENCOUNTER — Other Ambulatory Visit: Payer: Self-pay | Admitting: Specialist

## 2010-07-24 DIAGNOSIS — Z01810 Encounter for preprocedural cardiovascular examination: Secondary | ICD-10-CM | POA: Insufficient documentation

## 2010-07-24 DIAGNOSIS — Z01812 Encounter for preprocedural laboratory examination: Secondary | ICD-10-CM | POA: Insufficient documentation

## 2010-07-24 DIAGNOSIS — H0230 Blepharochalasis unspecified eye, unspecified eyelid: Secondary | ICD-10-CM | POA: Insufficient documentation

## 2010-07-24 DIAGNOSIS — K219 Gastro-esophageal reflux disease without esophagitis: Secondary | ICD-10-CM | POA: Insufficient documentation

## 2010-07-24 DIAGNOSIS — E039 Hypothyroidism, unspecified: Secondary | ICD-10-CM | POA: Insufficient documentation

## 2010-07-24 DIAGNOSIS — H543 Unqualified visual loss, both eyes: Secondary | ICD-10-CM | POA: Insufficient documentation

## 2010-07-25 LAB — POCT HEMOGLOBIN-HEMACUE: Hemoglobin: 14 g/dL (ref 12.0–15.0)

## 2010-07-27 ENCOUNTER — Telehealth: Payer: Self-pay | Admitting: *Deleted

## 2010-07-27 NOTE — Telephone Encounter (Signed)
That would be ok - but I really do prefer weight watchers program or [place] diet plan for long term weight loss -- they tend to be more balanced

## 2010-07-27 NOTE — Telephone Encounter (Signed)
Advised pt

## 2010-07-27 NOTE — Telephone Encounter (Signed)
Pt is trying to lose weight and wants to try the [employer] diet, which requires that she drink one and a half grapefruit at every meal, she is asking if ok for her to do this.

## 2010-08-04 ENCOUNTER — Telehealth: Payer: Self-pay | Admitting: *Deleted

## 2010-08-04 NOTE — Telephone Encounter (Signed)
Pt has appt to see you in July.  She has been feeling very tired and thinks her thyroid medicine might need to be adjusted.  She is asking if she can come in for labs prior to her appt with you to have that checked.

## 2010-08-06 NOTE — Telephone Encounter (Signed)
Go ahead and set up non fasting lab for tsh and free T4 244.9 thanks

## 2010-08-07 NOTE — Telephone Encounter (Signed)
Patient notified as instructed by telephone. Lab appt scheduled as instructed 08/08/10 at 10am. Also pt was not scheduled in July. Pt has appt card so i scheduled for 09/11/10 Mon at 11:30 for 6 month f/u. Pt thought she had appt same day for 6 month f/u at 11:15.

## 2010-08-08 ENCOUNTER — Other Ambulatory Visit (INDEPENDENT_AMBULATORY_CARE_PROVIDER_SITE_OTHER): Payer: Medicare HMO | Admitting: Family Medicine

## 2010-08-08 DIAGNOSIS — E039 Hypothyroidism, unspecified: Secondary | ICD-10-CM

## 2010-08-10 ENCOUNTER — Telehealth: Payer: Self-pay | Admitting: Family Medicine

## 2010-08-10 NOTE — Telephone Encounter (Signed)
Patient notified as instructed by telephone. 

## 2010-08-10 NOTE — Telephone Encounter (Signed)
Labs are ok  No change in thyroid dose  Will disc further at f/u in july

## 2010-09-02 ENCOUNTER — Encounter: Payer: Self-pay | Admitting: Family Medicine

## 2010-09-11 ENCOUNTER — Ambulatory Visit (INDEPENDENT_AMBULATORY_CARE_PROVIDER_SITE_OTHER): Payer: Medicare HMO | Admitting: Family Medicine

## 2010-09-11 ENCOUNTER — Encounter: Payer: Self-pay | Admitting: Family Medicine

## 2010-09-11 ENCOUNTER — Ambulatory Visit: Payer: Self-pay | Admitting: Family Medicine

## 2010-09-11 DIAGNOSIS — E785 Hyperlipidemia, unspecified: Secondary | ICD-10-CM

## 2010-09-11 DIAGNOSIS — F329 Major depressive disorder, single episode, unspecified: Secondary | ICD-10-CM

## 2010-09-11 DIAGNOSIS — E039 Hypothyroidism, unspecified: Secondary | ICD-10-CM

## 2010-09-11 DIAGNOSIS — F3289 Other specified depressive episodes: Secondary | ICD-10-CM

## 2010-09-11 NOTE — Progress Notes (Signed)
Subjective:    Patient ID: Frances Hoffman, female    DOB: [birth year], 70 y.o.   MRN: [id number]  HPI Here for f/u of hypothyroid and hyperlipidemia and mood  Is overall feeling ok   Had terrible bout of diarrhea last night  Could have been slim fast on Saturday  Is sore this am - but diarrhea is better  Last bm was 6 am  No  Nausea or fever   Doing better with diet - has cut pepsi down to 3 per week   Is too hot to exercise very much - knows she needs to    tsh is stable/ theraputic Clinically- feels about the same - feels better overall No hair or skin changes No fatigue     Lipids were controlled fairly in march  Lab Results  Component Value Date   CHOL 211* 05/05/2010   CHOL 201* 03/07/2010   CHOL 173 02/11/2009   Lab Results  Component Value Date   HDL 43.90 05/05/2010   HDL 41.00 03/07/2010   HDL 43.80 02/11/2009   Lab Results  Component Value Date   LDLCALC 106* 02/11/2009   LDLCALC 112* 07/01/2008   Lab Results  Component Value Date   TRIG 185.0* 05/05/2010   TRIG 158.0* 03/07/2010   TRIG 116.0 02/11/2009   Lab Results  Component Value Date   CHOLHDL 5 05/05/2010   CHOLHDL 5 03/07/2010   CHOLHDL 4 02/11/2009   Lab Results  Component Value Date   LDLDIRECT 138.5 05/05/2010   LDLDIRECT 139.9 03/07/2010     Diet- is pretty good about greasy foods and no red meat  Does not eat meat as a rule - ? Enough protein   Mood Is on lexapro -- is taking the generic - it does not work as well - but cannot afford the name brand  No depression spells lately but is prone to them Stress is average  Took prozac in the past - did not work as well   Patient Active Problem List  Diagnoses  . HYPOTHYROIDISM  . HYPERLIPIDEMIA  . DEPRESSION  . DRY MOUTH  . GERD  . UTI'S, RECURRENT  . ADHESIVE CAPSULITIS  . OSTEOPENIA  . FATIGUE  . WEIGHT GAIN  . PELVIC PAIN, LEFT  . NEOPLASM, MALIGNANT, BREAST, HX OF   Past Medical History  Diagnosis Date  . Cancer 2000    breast had  lumpectomy/radiation x36,mammo ,no chemo  . Depression   . GERD (gastroesophageal reflux disease)   . Hyperlipidemia   . Hypothyroid   . Frozen shoulder   . Osteopenia     BMD 2004, WNL 2008  . Shingles     chronic body pain, left side of body  . Dysuria-frequency syndrome     takes AZO  . Zoster     Hx of   Past Surgical History  Procedure Date  . Breast surgery 2000    lumpectomy   History  Substance Use Topics  . Smoking status: Never Smoker   . Smokeless tobacco: Not on file  . Alcohol Use: No   Family History  Problem Relation Age of Onset  . Osteoporosis Mother   . Parkinsonism Father   . Cancer Sister     breast CA  . Cancer Other     breast CA   No Known Allergies Current Outpatient Prescriptions on File Prior to Visit  Medication Sig Dispense Refill  . Calcium 1500 MG tablet Take 1,500 mg by mouth.        [name]  Cholecalciferol (VITAMIN D3) 2000 UNITS capsule Take 2,000 Units by mouth daily.        [name] escitalopram (LEXAPRO) 10 MG tablet Take 10 mg by mouth daily.        [name] levothyroxine (SYNTHROID) 50 MCG tablet Take 50 mcg by mouth daily.        . Multiple Vitamin (MULTIVITAMIN) tablet Take 1 tablet by mouth daily.        . Omega-3 Fatty Acids (FISH OIL PO) Take 1 capsule by mouth daily.        . simvastatin (ZOCOR) 40 MG tablet Take 40 mg by mouth daily.        . vitamin C (ASCORBIC ACID) 500 MG tablet Take 500 mg by mouth daily.            Review of Systems Review of Systems  Constitutional: Negative for fever, appetite change, fatigue and unexpected weight change.  Eyes: Negative for pain and visual disturbance.  Respiratory: Negative for cough and shortness of breath.   Cardiovascular: Negative. For cp or sob or palp   Gastrointestinal: Negative for nausea, diarrhea and constipation.  Genitourinary: Negative for urgency and frequency.  Skin: Negative for pallor.  Neurological: Negative for weakness, light-headedness, numbness and headaches.    Hematological: Negative for adenopathy. Does not bruise/bleed easily.  Psychiatric/Behavioral: mild depression fairly controlled - no anxiety          Objective:   Physical Exam  Constitutional: She appears well-developed and well-nourished. No distress.  HENT:  Head: Normocephalic and atraumatic.  Eyes: Conjunctivae and EOM are normal. Pupils are equal, round, and reactive to light.  Neck: Normal range of motion. Neck supple. No JVD present. Carotid bruit is not present. No thyromegaly present.  Cardiovascular: Normal rate, regular rhythm, normal heart sounds and intact distal pulses.   Pulmonary/Chest: Effort normal and breath sounds normal. No respiratory distress. She has no wheezes.  Abdominal: Soft. Bowel sounds are normal. She exhibits no distension, no abdominal bruit and no mass. There is no tenderness.  Musculoskeletal: She exhibits no edema.  Lymphadenopathy:    She has no cervical adenopathy.  Neurological: She is alert. She has normal reflexes. Coordination normal.       No tremor   Skin: Skin is warm and dry. No rash noted. No erythema. No pallor.  Psychiatric: She has a normal mood and affect.       Good eye contact and comm skills           Assessment & Plan:

## 2010-09-11 NOTE — Assessment & Plan Note (Signed)
Overall generic does not work as well but pt wants to stick with it  No depressive episodes lately Enc regular exercise

## 2010-09-11 NOTE — Patient Instructions (Signed)
Thyroid labs are good  Cholesterol could be a bit better -Avoid red meat/ fried foods/ egg yolks/ fatty breakfast meats/ butter, cheese and high fat dairy/ and shellfish   Make sure you are getting some low fat protien with each meal like egg whites/ fish / low fat meats/ nuts and peanut butter in small portions  No change in medicines  Schedule fasting labs and then follow up in 6 months

## 2010-09-11 NOTE — Assessment & Plan Note (Signed)
Lipids are fair- needs further imp in LDL  Is on zocor Disc low sat fat diet (also needs more lean protien for wt loss) Lab and f/u 6 mo

## 2010-09-11 NOTE — Assessment & Plan Note (Signed)
tsh is stable and no clinical changes Adv to continue current dose Rev labs with pt

## 2010-11-28 ENCOUNTER — Encounter: Payer: Self-pay | Admitting: Family Medicine

## 2010-11-28 ENCOUNTER — Ambulatory Visit (INDEPENDENT_AMBULATORY_CARE_PROVIDER_SITE_OTHER): Payer: Medicare HMO | Admitting: Family Medicine

## 2010-11-28 VITALS — BP 116/76 | HR 72 | Temp 97.9°F | Wt 159.2 lb

## 2010-11-28 DIAGNOSIS — R300 Dysuria: Secondary | ICD-10-CM

## 2010-11-28 DIAGNOSIS — R309 Painful micturition, unspecified: Secondary | ICD-10-CM

## 2010-11-28 DIAGNOSIS — N390 Urinary tract infection, site not specified: Secondary | ICD-10-CM

## 2010-11-28 LAB — POCT URINALYSIS DIPSTICK
Glucose, UA: NEGATIVE
Ketones, UA: NEGATIVE
Spec Grav, UA: 1.005

## 2010-11-28 MED ORDER — CIPROFLOXACIN HCL 500 MG PO TABS: 500.0000 mg | ORAL_TABLET | Freq: Two times a day (BID) | ORAL | Status: DC

## 2010-11-28 NOTE — Progress Notes (Signed)
Subjective:    Patient ID: Frances Hoffman, female    DOB: Mar 20, 1940, 70 y.o.   MRN: [id number]  HPI CC: ?UTI  3d h/o suprapubic pain in afternoons, stays with her, no radiation.  Taking azo.  Shivers with urinating.  Mild dysuria.  No urgency, polyuria.  No back pain, fevers/chills, n/v.  No hematuria.  Since change from lexapro to generic escitalopram, not working as well - not sleeping as well.  Previously on prozac  Review of Systems Per HPI    Objective:   Physical Exam  Nursing note and vitals reviewed. Constitutional: She appears well-developed and well-nourished. No distress.  HENT:  Mouth/Throat: Oropharynx is clear and moist. No oropharyngeal exudate.  Eyes: Conjunctivae and EOM are normal. Pupils are equal, round, and reactive to light.  Cardiovascular: Normal rate, regular rhythm, normal heart sounds and intact distal pulses.   No murmur heard. Pulmonary/Chest: Effort normal and breath sounds normal. No respiratory distress. She has no wheezes. She has no rales.  Abdominal: Soft. Bowel sounds are normal. She exhibits no distension. There is no hepatosplenomegaly. There is no tenderness. There is no rebound, no guarding and no CVA tenderness.  Musculoskeletal: Normal range of motion. She exhibits no edema.  Skin: Skin is warm and dry. No rash noted.  Psychiatric: She has a normal mood and affect.          Assessment & Plan:

## 2010-11-28 NOTE — Patient Instructions (Signed)
Looks like urinary tract infection - treat with cipro twice daily for next 5 days. Update [place] if not improving as expected. Push fluids and plenty of rest.

## 2010-11-28 NOTE — Assessment & Plan Note (Addendum)
UA/micro consistent with this. Treat with cipro twice daily for 3 days. Update if not improving as expected.

## 2010-12-04 ENCOUNTER — Telehealth: Payer: Self-pay | Admitting: *Deleted

## 2010-12-04 MED ORDER — CIPROFLOXACIN HCL 500 MG PO TABS: 500.0000 mg | ORAL_TABLET | Freq: Two times a day (BID) | ORAL | Status: AC

## 2010-12-04 NOTE — Telephone Encounter (Signed)
Spoke with pt.  dsicussed below.

## 2010-12-04 NOTE — Telephone Encounter (Signed)
Pt was seen last week for UTI and given cipro.  She says she is somewhat better but not totally.  Still having some lower abd pain with urination.  No frequency, no urgency, no fever.  Finished cipro on Sunday. Please advise on what to do next.  Uses [facility] in case you want to continue abx for awhile longer.

## 2010-12-04 NOTE — Telephone Encounter (Signed)
Please notify i'd like her to take longer course of antibiotic.  Sent in 7 more days of abx.  If not improved after this, please have her return for re evaluation.

## 2010-12-08 ENCOUNTER — Telehealth: Payer: Self-pay | Admitting: *Deleted

## 2010-12-08 NOTE — Telephone Encounter (Signed)
Since she is on lexapro just 10 mg - can just stop it any time - is already at the lowest dose May feel jittery/ light headed or irritable for a few days- (hopefully not )- but should then be fine - so keep me updated Benadryl 25-50 mg is ok for sleep 1 hour before bedtime  Please remove lexapro from med list

## 2010-12-08 NOTE — Telephone Encounter (Signed)
Pt has been taking generic lexapro for about 2 months but she says this doesn't help her.  She wants to stop it.  She doesn't want to go back to name brand, which she took for years, because it's too expensive. Ok to just stop or should she wean off? States she takes this for depression and to help her sleep.  She said she would take tylenol pm to help her sleep, but, I told her that,  unless she has pain, better to avoid the tylenol and just take benedryl.

## 2010-12-08 NOTE — Telephone Encounter (Signed)
Patient notified as instructed by telephone. Med list updated by removing Lexapro.

## 2011-01-08 NOTE — Op Note (Signed)
  NAMEKATALEAH, BEJAR               ACCOUNT NO.:  [id number]  MEDICAL RECORD NO.:  [id number]          PATIENT TYPE:  LOCATION:                                 FACILITY:  PHYSICIAN:  Trentyn Boisclair L. Shon Hough, M.D.   DATE OF BIRTH:  DATE OF PROCEDURE:  07/24/2010 DATE OF DISCHARGE:                              OPERATIVE REPORT   This is a 69-year lady who has increased blepharochalasis, heaviness in the upper lids bilaterally proven on visual inspection with the skin of pupils as well as vision of field test documenting a deficit of vision in superior fields.  PROCEDURES DONE:  Upper lid blepharoplasties, right and left brow lift for excess skin laterally.  ANESTHESIA:  General.  DESCRIPTION OF PROCEDURE:  The patient was sat up and drawn for the skin excisions of the upper lid skin area using skin calipers.  She then underwent general anesthesia and intubated orally.  Prep was done to the face, neck areas, and scalp with Betadine solution and walled off with sterile towels and drapes so as to make a sterile field.  A 0.5% Xylocaine With Epinephrine was injected locally after the patient was intubated with no difficulty.  We used 10 mL per side and another extra 5 mL in the right and left temporal areas.  Excess skin was excised with #15 blade down the underlying musculature.  Strip of musculature was taken across and revealing the underlying fat pads which were removed surgically.  Medial fat pad was also removed after proper hemostasis. The skin was then reapproximated with a running subcuticular stitches of 5-0 nylon out laterally and then the lateral part was closed with 6-0 silk. Right and left sides were done in the same manner.  Next incision was made in the right temporal hairline area and dissection carried down to underlying fascia.  The dissection was carried down all the way down to the right and left superior orbital areas laterally.  Hemostasis was maintained again  with the Bovie coagulation.  Excess skin was delivered and trimmed posteriorly so as not to lose definition of the hairline areas.  After proper hemostasis, the wounds were closed in layers with 3- 0 Monocryl x2 layers and then multiple skin staples.  The wounds were cleansed.  Ophthalmic solution was applied to all the areas and augment and head wrap for the temporal areas.  She withstood the procedure very well, was taken to recovery in good condition.  Estimated blood loss less than 50 mL.  Complications none.     Yaakov Guthrie. Shon Hough, M.D.     Cathie Hoops  D:  07/24/2010  T:  07/24/2010  Job:  [id number]  Electronically Signed by Louisa Second M.D. on 01/08/2011 07:06:09 PM

## 2011-01-09 ENCOUNTER — Encounter: Payer: Self-pay | Admitting: Family Medicine

## 2011-01-09 ENCOUNTER — Ambulatory Visit (INDEPENDENT_AMBULATORY_CARE_PROVIDER_SITE_OTHER): Payer: Medicare HMO | Admitting: Family Medicine

## 2011-01-09 VITALS — BP 124/76 | HR 80 | Temp 98.0°F | Wt 161.1 lb

## 2011-01-09 DIAGNOSIS — N390 Urinary tract infection, site not specified: Secondary | ICD-10-CM

## 2011-01-09 DIAGNOSIS — R300 Dysuria: Secondary | ICD-10-CM

## 2011-01-09 LAB — POCT URINALYSIS DIPSTICK
Bilirubin, UA: NEGATIVE
Ketones, UA: NEGATIVE
Nitrite, UA: POSITIVE
Protein, UA: NEGATIVE

## 2011-01-09 MED ORDER — CIPROFLOXACIN HCL 500 MG PO TABS: 500.0000 mg | ORAL_TABLET | Freq: Two times a day (BID) | ORAL | Status: DC

## 2011-01-09 NOTE — Assessment & Plan Note (Signed)
UA consistent with this with large LE, + nitrate, mod blood, micro also consistent. Given recent cipro 11/28/2010, sent culture today. Treat with cipro 500mg  bid x 5 days. Update if not improved.

## 2011-01-09 NOTE — Patient Instructions (Signed)
Looks like urinary infection Treat with cipro 500mg  twice daily for 5 days.  Update [place] if not improved after this. Call us with questions. Push fluids and rest.  May use tylenol for discomfort.  Urinary Tract Infection Infections of the urinary tract can start in several places. A bladder infection (cystitis), a kidney infection (pyelonephritis), and a prostate infection (prostatitis) are different types of urinary tract infections (UTIs). They usually get better if treated with medicines (antibiotics) that kill germs. Take all the medicine until it is gone. You or your child may feel better in a few days, but TAKE ALL MEDICINE or the infection may not respond and may become more difficult to treat. HOME CARE INSTRUCTIONS   Drink enough water and fluids to keep the urine clear or pale yellow. Cranberry juice is especially recommended, in addition to large amounts of water.   Avoid caffeine, tea, and carbonated beverages. They tend to irritate the bladder.   Alcohol may irritate the prostate.   Only take over-the-counter or prescription medicines for pain, discomfort, or fever as directed by your caregiver.  To prevent further infections:  Empty the bladder often. Avoid holding urine for long periods of time.   After a bowel movement, women should cleanse from front to back. Use each tissue only once.   Empty the bladder before and after sexual intercourse.  FINDING OUT THE RESULTS OF YOUR TEST Not all test results are available during your visit. If your or your child's test results are not back during the visit, make an appointment with your caregiver to find out the results. Do not assume everything is normal if you have not heard from your caregiver or the medical facility. It is important for you to follow up on all test results. SEEK MEDICAL CARE IF:   There is back pain.   Your baby is older than 3 months with a rectal temperature of 100.5 F (38.1 C) or higher for more than 1  day.   Your or your child's problems (symptoms) are no better in 3 days. Return sooner if you or your child is getting worse.  SEEK IMMEDIATE MEDICAL CARE IF:   There is severe back pain or lower abdominal pain.   You or your child develops chills.   You have a fever.   Your baby is older than 3 months with a rectal temperature of 102 F (38.9 C) or higher.   Your baby is 28 months old or younger with a rectal temperature of 100.4 F (38 C) or higher.   There is nausea or vomiting.   There is continued burning or discomfort with urination.  MAKE SURE YOU:   Understand these instructions.   Will watch your condition.   Will get help right away if you are not doing well or get worse.  Document Released: 11/29/2004 Document Revised: 11/01/2010 Document Reviewed: 07/04/2006 [facility] Patient Information 2012 [place], [place].

## 2011-01-09 NOTE — Progress Notes (Signed)
Subjective:    Patient ID: Frances Hoffman, female    DOB: May 02, 1940, 70 y.o.   MRN: [id number]  HPI CC: abd pain  Seen here 11/28/2010 with dx UTI, treated with 7 d course cipro and improved.    Last week felt ill.  Started 11-12 d ago with suprapubic pain, dysuria, frequency, shivering with voiding.  HA started same day, some diarrhea started on Saturday, some mucous and loose stool.  Diarrhea has since resolved.  No cough, congestion, RN, sinus drainage.  No fevers/chills, back pain, nausea/vomiting.  Took azo, tylenol which eased pain off.  Review of Systems Per HPI    Objective:   Physical Exam  Nursing note and vitals reviewed. Constitutional: She appears well-developed and well-nourished. No distress.  HENT:  Head: Normocephalic and atraumatic.  Mouth/Throat: Oropharynx is clear and moist. No oropharyngeal exudate.  Eyes: Conjunctivae and EOM are normal. Pupils are equal, round, and reactive to light. No scleral icterus.  Abdominal: Soft. Bowel sounds are normal. She exhibits no distension and no mass. There is no hepatosplenomegaly. There is no tenderness. There is no rebound, no guarding and no CVA tenderness.      Assessment & Plan:

## 2011-01-11 ENCOUNTER — Telehealth: Payer: Self-pay | Admitting: *Deleted

## 2011-01-11 LAB — URINE CULTURE

## 2011-01-11 MED ORDER — SULFAMETHOXAZOLE-TRIMETHOPRIM 800-160 MG PO TABS: 1.0000 | ORAL_TABLET | Freq: Two times a day (BID) | ORAL | Status: AC

## 2011-01-11 NOTE — Telephone Encounter (Signed)
Patient notified as instructed by telephone. Pt will stop Cipro and start Sept[facility] DS.[facility]

## 2011-01-11 NOTE — Telephone Encounter (Signed)
I checked her urine culture and her allergy list  We could try sulfa/ septra Px written for call in   Update if no further improvement

## 2011-01-11 NOTE — Telephone Encounter (Signed)
Pt started cipro on Tuesday for a UTI and she thinks this is making her sick.  She has had headache, nausea, feels swimmy headed.  Wonders if she should change to something else. Uses [facility] [address].

## 2011-01-15 ENCOUNTER — Telehealth: Payer: Self-pay | Admitting: *Deleted

## 2011-01-15 MED ORDER — SULFAMETHOXAZOLE-TMP DS 800-160 MG PO TABS: 1.0000 | ORAL_TABLET | Freq: Two times a day (BID) | ORAL | Status: AC

## 2011-01-15 NOTE — Telephone Encounter (Signed)
Pt called to report that she still doesn't feel well with the 2nd antibiotic that she was given for UTI. Still has headache, some nausea and her stomach hurts under her breast bone.  She is still having UTI symptoms.  What to you advise?  [facility]ses [facility] [address].

## 2011-01-15 NOTE — Telephone Encounter (Signed)
Any fevers or vomiting or flank pain? Will prolong course for another week.   If not better, would want her seen again.

## 2011-01-16 ENCOUNTER — Encounter: Payer: Self-pay | Admitting: Family Medicine

## 2011-01-16 ENCOUNTER — Ambulatory Visit (INDEPENDENT_AMBULATORY_CARE_PROVIDER_SITE_OTHER): Payer: Medicare HMO | Admitting: Family Medicine

## 2011-01-16 VITALS — BP 110/70 | HR 88 | Temp 97.2°F | Ht 63.0 in | Wt 160.5 lb

## 2011-01-16 DIAGNOSIS — F329 Major depressive disorder, single episode, unspecified: Secondary | ICD-10-CM

## 2011-01-16 DIAGNOSIS — K219 Gastro-esophageal reflux disease without esophagitis: Secondary | ICD-10-CM

## 2011-01-16 DIAGNOSIS — F3289 Other specified depressive episodes: Secondary | ICD-10-CM

## 2011-01-16 DIAGNOSIS — G4700 Insomnia, unspecified: Secondary | ICD-10-CM

## 2011-01-16 DIAGNOSIS — N390 Urinary tract infection, site not specified: Secondary | ICD-10-CM

## 2011-01-16 LAB — POCT URINALYSIS DIPSTICK
Bilirubin, UA: NEGATIVE
Ketones, UA: NEGATIVE
Spec Grav, UA: 1.01

## 2011-01-16 LAB — POCT UA - MICROSCOPIC ONLY
Crystals, Ur, HPF, POC: 0
RBC, urine, microscopic: 0
WBC, Ur, HPF, POC: 0

## 2011-01-16 NOTE — Assessment & Plan Note (Signed)
Suspect stress related Disc sleep hygiene in detail Is exercising and no caffeine  Will try benadryl 25-50 at bedtime and report back  May consider counseling inst to f/u if further problems

## 2011-01-16 NOTE — Assessment & Plan Note (Signed)
Worse lately with stress- son is getting a divorce  Is not currrently on med- was on lexapro  Problems with sleep- will try benadryl She will call back if she wants to disc further or wants to see Dr Laymond Purser for counseling Explained also that epigastric pain could be related to stress

## 2011-01-16 NOTE — Assessment & Plan Note (Signed)
ecoli currently tx with septra  Will bring urine for analysis back as she could not leave a sample today  Suspect her epigastric pain is actually from abx or stress

## 2011-01-16 NOTE — Assessment & Plan Note (Signed)
This is worse since starting both abx -- and also epigastric pain  Suspect abx or stress rel inst to inc prilosec to bid for 1 week and watch diet  If not imp- inst to report back

## 2011-01-16 NOTE — Telephone Encounter (Signed)
Patient notified. She said she has felt horrible since being on the abx. She said her stomach stills hurts and she has vomited a couple of times. I offered her a recheck today and she scheduled with Dr. Milinda Antis

## 2011-01-16 NOTE — Progress Notes (Signed)
Patient notified as instructed by telephone that pt's urinalysis was borderline and will be sent for culture. Pt will be updated when get urine culture report.

## 2011-01-16 NOTE — Patient Instructions (Signed)
Your stomach pain may be due to either stress/ the recent antibiotic or a virus Drink lots of fluids We will call you about urine test  Increase your prilosec to twice daily for 7 days and then go back to daily  If at that point not improved let me know  If worse at any time let me know  Try benadryl 25-50mg  at bedtime  If sleep problems does not improve or you want to talk about stress - follow up with me and we will discuss counseling/ medication for sleep  Our counselor is Evalina Field

## 2011-01-16 NOTE — Progress Notes (Signed)
Subjective:    Patient ID: Frances Hoffman, female    DOB: January 24, 1941, 70 y.o.   MRN: [id number]  HPI Here for f/u of e coli uti that is pan sensitive -- took cipro with side eff and then took septra  Also upper abd pain  Also some sleep issues with stress  Still having some frequency- and could be from big water intake   Also not feeling well  Upper abd pain -- soreness Did vomit on Wednesday  Also headache and general malaise  Had some diarrhea but better now   Took last septra this am   In upper abdomen- is a dull nagging pain -- ? From the pill  Takes generic of prilosec every day  Does get some acid feeling in chest -just since she started abx  No fever or other symptoms   Going to the gym - is proud of that   Patient Active Problem List  Diagnoses  . HYPOTHYROIDISM  . HYPERLIPIDEMIA  . DEPRESSION  . DRY MOUTH  . GERD  . UTI'S, RECURRENT  . ADHESIVE CAPSULITIS  . OSTEOPENIA  . FATIGUE  . WEIGHT GAIN  . PELVIC PAIN, LEFT  . NEOPLASM, MALIGNANT, BREAST, HX OF  . UTI (lower urinary tract infection)  . Insomnia   Past Medical History  Diagnosis Date  . Cancer 2000    breast had lumpectomy/radiation x36,mammo ,no chemo  . Depression   . GERD (gastroesophageal reflux disease)   . Hyperlipidemia   . Hypothyroid   . Frozen shoulder   . Osteopenia     BMD 2004, WNL 2008  . Shingles     chronic body pain, left side of body  . Dysuria-frequency syndrome     takes AZO  . Zoster     Hx of   Past Surgical History  Procedure Date  . Breast surgery 2000    lumpectomy   History  Substance Use Topics  . Smoking status: Never Smoker   . Smokeless tobacco: Not on file  . Alcohol Use: No   Family History  Problem Relation Age of Onset  . Osteoporosis Mother   . Parkinsonism Father   . Cancer Sister     breast CA  . Cancer Other     breast CA   Allergies  Allergen Reactions  . Ciprofloxacin Hcl     Dizzy/ nauseated feels bad    Current Outpatient  Prescriptions on File Prior to Visit  Medication Sig Dispense Refill  . Calcium 1500 MG tablet Take 1,500 mg by mouth.        . Cholecalciferol (VITAMIN D3) 2000 UNITS capsule Take 2,000 Units by mouth daily.        . Diphenhydramine-APAP, sleep, (TYLENOL PM EXTRA STRENGTH PO) Take by mouth as needed.        [name] levothyroxine (SYNTHROID) 50 MCG tablet Take 50 mcg by mouth daily.        . Multiple Vitamin (MULTIVITAMIN) tablet Take 1 tablet by mouth daily.        . Omega-3 Fatty Acids (FISH OIL PO) Take 1 capsule by mouth daily.        . simvastatin (ZOCOR) 40 MG tablet Take 40 mg by mouth daily.        . vitamin C (ASCORBIC ACID) 500 MG tablet Take 500 mg by mouth daily.        [name] sulfamethoxazole-trimethoprim (BACTRIM DS) 800-160 MG per tablet Take 1 tablet by mouth 2 (two) times daily.  14 tablet  0       Review of Systems Review of Systems  Constitutional: Negative for fever, appetite change, fatigue and unexpected weight change.  Eyes: Negative for pain and visual disturbance.  Respiratory: Negative for cough and shortness of breath.   Cardiovascular: Negative for cp or palpitations    Gastrointestinal: Negative for, diarrhea and constipation. pos for nausea  Genitourinary: pos for urgency and frequency, no dysuria  Skin: Negative for pallor or rash   Neurological: Negative for weakness, light-headedness, numbness and headaches.  Hematological: Negative for adenopathy. Does not bruise/bleed easily.  Psychiatric/Behavioral: Negative for dysphoric mood. The patient is not nervous/anxious.         Objective:   Physical Exam  Constitutional: She appears well-developed and well-nourished. No distress.       overwt and well appearing   HENT:  Head: Normocephalic and atraumatic.  Mouth/Throat: Oropharynx is clear and moist.  Eyes: Conjunctivae and EOM are normal. Pupils are equal, round, and reactive to light. No scleral icterus.  Neck: Normal range of motion. Neck supple. No JVD  present. Carotid bruit is not present. No thyromegaly present.  Cardiovascular: Normal rate, regular rhythm, normal heart sounds and intact distal pulses.  Exam reveals no gallop.   Pulmonary/Chest: Effort normal and breath sounds normal. No respiratory distress. She has no wheezes.  Abdominal: Soft. Bowel sounds are normal. She exhibits no distension, no abdominal bruit and no mass. There is no tenderness. There is no rebound and no guarding.       No suprapubic tenderness    Musculoskeletal: She exhibits no edema and no tenderness.       No cva tenderness   Lymphadenopathy:    She has no cervical adenopathy.  Neurological: She is alert. She has normal reflexes. No cranial nerve deficit. Coordination normal.  Skin: Skin is warm and dry. No rash noted. No erythema. No pallor.  Psychiatric: She has a normal mood and affect.          Assessment & Plan:

## 2011-01-17 ENCOUNTER — Ambulatory Visit: Payer: Medicare HMO | Admitting: Family Medicine

## 2011-01-18 LAB — URINE CULTURE: Colony Count: NO GROWTH

## 2011-01-30 ENCOUNTER — Telehealth: Payer: Self-pay

## 2011-01-30 NOTE — Telephone Encounter (Signed)
Patient notified as instructed by telephone. 

## 2011-01-30 NOTE — Telephone Encounter (Signed)
I still want her to f/u with me --- the dizziness could be from stopping the lexapro but I would expect it to be getting better by now  Keep f/u appt

## 2011-01-30 NOTE — Telephone Encounter (Signed)
Pt said before she saw Dr Milinda Antis 01/16/11 she had stopped Lexapro and each morning she feels dizzy. Later in the morning dizziness subsides but when first stands up on and off has dizziness. Pt wonders if that could be from stopping Lexapro or something else. Also Pt said still feels like coating on tongue is gone, even water irritates her tongue. Pt already has appt to see Dr Milinda Antis on 02/05/11 but wonders if she needs to see specialist instead. Please advise. [facility] uses [facility] [address] if pharmacy is needed and pt said can leave message on answering machine at [phone]. Pt said she does not require call back today.

## 2011-02-05 ENCOUNTER — Ambulatory Visit (INDEPENDENT_AMBULATORY_CARE_PROVIDER_SITE_OTHER): Payer: Medicare HMO | Admitting: Family Medicine

## 2011-02-05 ENCOUNTER — Encounter: Payer: Self-pay | Admitting: Family Medicine

## 2011-02-05 VITALS — BP 116/74 | HR 84 | Temp 97.9°F | Ht 63.0 in | Wt 161.2 lb

## 2011-02-05 DIAGNOSIS — K117 Disturbances of salivary secretion: Secondary | ICD-10-CM

## 2011-02-05 DIAGNOSIS — F329 Major depressive disorder, single episode, unspecified: Secondary | ICD-10-CM

## 2011-02-05 DIAGNOSIS — R42 Dizziness and giddiness: Secondary | ICD-10-CM | POA: Insufficient documentation

## 2011-02-05 MED ORDER — MECLIZINE HCL 25 MG PO TABS: ORAL_TABLET | ORAL | Status: DC

## 2011-02-05 MED ORDER — CITALOPRAM HYDROBROMIDE 20 MG PO TABS: 20.0000 mg | ORAL_TABLET | Freq: Every day | ORAL | Status: DC

## 2011-02-05 NOTE — Assessment & Plan Note (Signed)
Many months of dry mouth and tongue irritation interfering with her ability to eat  failed magic mouthwash  In past  No signs of thrush  Ref to ENT for further eval

## 2011-02-05 NOTE — Assessment & Plan Note (Signed)
Worse after stopping lexapro but cannot afford it  Will try celexa 20 and update  F/u jan as planned  Rev poss side eff/ red flags  No SI Consider counseling in future

## 2011-02-05 NOTE — Assessment & Plan Note (Signed)
Suspect mild vertigo -- also depression could be a factor  Trial of meclizine when needed at bedtime Is going to ENT for mouth problems as well

## 2011-02-05 NOTE — Patient Instructions (Addendum)
Start celexa for depression -and update me if any side effects (like worse depression)  Follow up for check up as planned  Try meclizine at bedtime for dizziness if needed (do not take benadryl with  this )  We will do ENT referral at check out for mouth symptoms and dizziness

## 2011-02-05 NOTE — Progress Notes (Signed)
Subjective:    Patient ID: Frances Hoffman, female    DOB: [birth year], 70 y.o.   MRN: [id number]  HPI Feeling sluggish and dizzy lately  When she first gets up from bed or a chair - gets dizzy and that is brief once she gets going  Also when she lies back down  Did stop her lexapro -- - about a month ago  No ha or nausea   Has not tried celexa yet   Wonders if it is inner ear problem  Is depressed on and off -- cannot sleep/ worry and anxiety (excessive ) , does not cry- just mopes  Some anhedonia  Stress- son is going through a divorce  Has not had any counselor  Does not sleep well  Is still an active volunteer  Also tongue is really bothering her  Feels funny/ irritated - and food does not taste good  Wants a specialist referral  Tried magic mouthwash (? And biotene) otc - no help Did not imp when she stopped the antidepressant  Patient Active Problem List  Diagnoses  . HYPOTHYROIDISM  . HYPERLIPIDEMIA  . DEPRESSION  . DRY MOUTH  . GERD  . UTI'S, RECURRENT  . ADHESIVE CAPSULITIS  . OSTEOPENIA  . FATIGUE  . WEIGHT GAIN  . PELVIC PAIN, LEFT  . NEOPLASM, MALIGNANT, BREAST, HX OF  . UTI (lower urinary tract infection)  . Insomnia  . Dizzy   Past Medical History  Diagnosis Date  . Cancer 2000    breast had lumpectomy/radiation x36,mammo ,no chemo  . Depression   . GERD (gastroesophageal reflux disease)   . Hyperlipidemia   . Hypothyroid   . Frozen shoulder   . Osteopenia     BMD 2004, WNL 2008  . Shingles     chronic body pain, left side of body  . Dysuria-frequency syndrome     takes AZO  . Zoster     Hx of   Past Surgical History  Procedure Date  . Breast surgery 2000    lumpectomy   History  Substance Use Topics  . Smoking status: Never Smoker   . Smokeless tobacco: Not on file  . Alcohol Use: No   Family History  Problem Relation Age of Onset  . Osteoporosis Mother   . Parkinsonism Father   . Cancer Sister     breast CA  . Cancer Other      breast CA   Allergies  Allergen Reactions  . Ciprofloxacin Hcl     Dizzy/ nauseated feels bad    Current Outpatient Prescriptions on File Prior to Visit  Medication Sig Dispense Refill  . Calcium 1500 MG tablet Take 1,500 mg by mouth.        . Cholecalciferol (VITAMIN D3) 2000 UNITS capsule Take 2,000 Units by mouth daily.        [name] levothyroxine (SYNTHROID) 50 MCG tablet Take 50 mcg by mouth daily.        . Multiple Vitamin (MULTIVITAMIN) tablet Take 1 tablet by mouth daily.        . Omega-3 Fatty Acids (FISH OIL PO) Take 1 capsule by mouth daily.        [name] OMEPRAZOLE PO Take 1 tablet by mouth daily.        . simvastatin (ZOCOR) 40 MG tablet Take 40 mg by mouth daily.        . vitamin C (ASCORBIC ACID) 500 MG tablet Take 500 mg by mouth daily.        [name]  Diphenhydramine-APAP, sleep, (TYLENOL PM EXTRA STRENGTH PO) Take by mouth as needed.               Review of Systems Review of Systems  Constitutional: Negative for fever, appetite change,  and unexpected weight change. pos for fatigue ENT neg for st or ear pain or sinus pain  Eyes: Negative for pain and visual disturbance.  Respiratory: Negative for cough and shortness of breath.   Cardiovascular: Negative for cp or palpitations    Gastrointestinal: Negative for nausea, diarrhea and constipation.  Genitourinary: Negative for urgency and frequency.  Skin: Negative for pallor or rash   Neurological: Negative for weakness, light-headedness, numbness and headaches.  Hematological: Negative for adenopathy. Does not bruise/bleed easily.  Psychiatric/Behavioral: pos  for dysphoric mood. The patient is not nervous/anxious.  no SI        Objective:   Physical Exam  Constitutional: She appears well-developed and well-nourished. No distress.       overwt and well appearing   HENT:  Head: Normocephalic and atraumatic.  Right Ear: External ear normal.  Nose: Nose normal.  Mouth/Throat: Oropharynx is clear and moist.       Nl  appearing tongue with some fissuring   Eyes: Conjunctivae and EOM are normal. Pupils are equal, round, and reactive to light. Right eye exhibits no discharge. Left eye exhibits no discharge. No scleral icterus.       No nystagmus   Neck: Normal range of motion. Neck supple. No JVD present. No tracheal deviation present. No thyromegaly present.  Cardiovascular: Normal rate, regular rhythm, normal heart sounds and intact distal pulses.  Exam reveals no gallop.   Pulmonary/Chest: Effort normal and breath sounds normal. No respiratory distress. She has no wheezes.  Abdominal: Soft. Bowel sounds are normal.  Musculoskeletal: She exhibits no edema and no tenderness.  Lymphadenopathy:    She has no cervical adenopathy.  Neurological: She is alert. She has normal reflexes. No cranial nerve deficit or sensory deficit. She exhibits normal muscle tone. She displays a negative Romberg sign. Coordination and gait normal.  Skin: Skin is warm and dry. No rash noted. No erythema. No pallor.  Psychiatric:       Seems generally down- but with good eye contact and communication skills  Pleasant  Not tearful          Assessment & Plan:

## 2011-03-02 ENCOUNTER — Telehealth: Payer: Self-pay | Admitting: Internal Medicine

## 2011-03-02 MED ORDER — FLUOXETINE HCL 20 MG PO TABS: 20.0000 mg | ORAL_TABLET | Freq: Every day | ORAL | Status: DC

## 2011-03-02 NOTE — Telephone Encounter (Signed)
Patient notified as instructed by telephone. Pt will stop Celexa and try Prozac. Medication phoned to [facility] pharmacy as instructed.

## 2011-03-02 NOTE — Telephone Encounter (Signed)
Ok that is fine Px written for call in  Since I do not know what pharmacy  F/u as already planned and let me know if any problem with the prozac

## 2011-03-02 NOTE — Telephone Encounter (Signed)
Since patient has started Celexa she is having weird thoughts like about trouble things that could happen to someone.  She has tried Prozac and it worked well for her and wants to switch and try Prozac again.

## 2011-03-15 ENCOUNTER — Other Ambulatory Visit (INDEPENDENT_AMBULATORY_CARE_PROVIDER_SITE_OTHER): Payer: Medicare HMO

## 2011-03-15 DIAGNOSIS — E039 Hypothyroidism, unspecified: Secondary | ICD-10-CM

## 2011-03-15 DIAGNOSIS — E785 Hyperlipidemia, unspecified: Secondary | ICD-10-CM

## 2011-03-15 LAB — LIPID PANEL
Cholesterol: 231 mg/dL — ABNORMAL HIGH (ref 0–200)
Total CHOL/HDL Ratio: 5
Triglycerides: 127 mg/dL (ref 0.0–149.0)

## 2011-03-15 LAB — TSH: TSH: 12.29 u[IU]/mL — ABNORMAL HIGH (ref 0.35–5.50)

## 2011-03-15 LAB — LDL CHOLESTEROL, DIRECT: Direct LDL: 151.8 mg/dL

## 2011-03-20 ENCOUNTER — Ambulatory Visit (INDEPENDENT_AMBULATORY_CARE_PROVIDER_SITE_OTHER): Payer: Medicare Other | Admitting: Family Medicine

## 2011-03-20 ENCOUNTER — Encounter: Payer: Self-pay | Admitting: Family Medicine

## 2011-03-20 VITALS — BP 108/74 | HR 80 | Temp 97.8°F | Ht 63.0 in | Wt 161.8 lb

## 2011-03-20 DIAGNOSIS — E785 Hyperlipidemia, unspecified: Secondary | ICD-10-CM

## 2011-03-20 DIAGNOSIS — R197 Diarrhea, unspecified: Secondary | ICD-10-CM | POA: Insufficient documentation

## 2011-03-20 DIAGNOSIS — E039 Hypothyroidism, unspecified: Secondary | ICD-10-CM

## 2011-03-20 MED ORDER — LEVOTHYROXINE SODIUM 100 MCG PO TABS: 100.0000 ug | ORAL_TABLET | Freq: Every day | ORAL | Status: DC

## 2011-03-20 MED ORDER — ATORVASTATIN CALCIUM 20 MG PO TABS: 20.0000 mg | ORAL_TABLET | Freq: Every day | ORAL | Status: DC

## 2011-03-20 NOTE — Assessment & Plan Note (Signed)
Not adequate control despite good diet and zocor Change to lipitor 20  Update if side eff Rev low sat fat diet Lab 6 wk and f/u

## 2011-03-20 NOTE — Assessment & Plan Note (Signed)
Ongoing and episodic ? If poss inflam bd vs food intol (? Celiac) vs allergy vs IBS Will try fiber  Food and symptom journal  F/u GI in feb

## 2011-03-20 NOTE — Progress Notes (Signed)
Subjective:    Patient ID: Frances Hoffman, female    DOB: 1940-04-11, 71 y.o.   MRN: [id number]  HPI Here for f/u for hyperlipidemia and also hypothyroidism and diarrhea that is newly mentioned    bp good 108/74 Wt is stable with bmi of 28  LDL is up in 150s from 130s Lab Results  Component Value Date   CHOL 231* 03/15/2011   CHOL 211* 05/05/2010   CHOL 201* 03/07/2010   Lab Results  Component Value Date   HDL 47.90 03/15/2011   HDL 01.60 05/05/2010   HDL 41.00 03/07/2010   Lab Results  Component Value Date   LDLCALC 106* 02/11/2009   LDLCALC 112* 07/01/2008   Lab Results  Component Value Date   TRIG 127.0 03/15/2011   TRIG 185.0* 05/05/2010   TRIG 158.0* 03/07/2010   Lab Results  Component Value Date   CHOLHDL 5 03/15/2011   CHOLHDL 5 05/05/2010   CHOLHDL 5 03/07/2010   Lab Results  Component Value Date   LDLDIRECT 151.8 03/15/2011   LDLDIRECT 138.5 05/05/2010   LDLDIRECT 139.9 03/07/2010   is on zocor 40 and diet  Eats right most of time -- avoids fried foods/ red meat /shellfish / bkfast meats / does eat a bit of ham   Hypothyroid tsh is high Lab Results  Component Value Date   TSH 12.29* 03/15/2011   dosing- synthroid 50 mcg No missed doses  Is more tired  No skin or hair changes  Takes med at 5 am - before she eats  No neck swelling or goiter    Diarrhea on and off for years Possibly 2 years Sees Dr Matthias Hughs Wakes up in middle of night - abd pain -- then starts episode of diarrhea for 2-3 hours -- rather severely  Sometimes pepto helps  No blood in stool in general  Does not take fiber but eats raisin bran occasionally  colonosc 2010-negative  tsh is high  Has GI appt in feb  Stays away from milk- is allergic to it   Patient Active Problem List  Diagnoses  . HYPOTHYROIDISM  . HYPERLIPIDEMIA  . DEPRESSION  . DRY MOUTH  . GERD  . UTI'S, RECURRENT  . ADHESIVE CAPSULITIS  . OSTEOPENIA  . FATIGUE  . WEIGHT GAIN  . PELVIC PAIN, LEFT  . NEOPLASM, MALIGNANT,  BREAST, HX OF  . UTI (lower urinary tract infection)  . Insomnia  . Dizzy  . Diarrhea   Past Medical History  Diagnosis Date  . Cancer 2000    breast had lumpectomy/radiation x36,mammo ,no chemo  . Depression   . GERD (gastroesophageal reflux disease)   . Hyperlipidemia   . Hypothyroid   . Frozen shoulder   . Osteopenia     BMD 2004, WNL 2008  . Shingles     chronic body pain, left side of body  . Dysuria-frequency syndrome     takes AZO  . Zoster     Hx of   Past Surgical History  Procedure Date  . Breast surgery 2000    lumpectomy   History  Substance Use Topics  . Smoking status: Never Smoker   . Smokeless tobacco: Not on file  . Alcohol Use: No   Family History  Problem Relation Age of Onset  . Osteoporosis Mother   . Parkinsonism Father   . Cancer Sister     breast CA  . Cancer Other     breast CA   Allergies  Allergen Reactions  .  Celexa     Intrusive/ weird thoughts   . Ciprofloxacin Hcl     Dizzy/ nauseated feels bad    Current Outpatient Prescriptions on File Prior to Visit  Medication Sig Dispense Refill  . Calcium 1500 MG tablet Take 1,500 mg by mouth.        . Cholecalciferol (VITAMIN D3) 2000 UNITS capsule Take 2,000 Units by mouth daily.        [name] FLUoxetine (PROZAC) 20 MG tablet Take 1 tablet (20 mg total) by mouth daily.  30 tablet  11  . meclizine (ANTIVERT) 25 MG tablet One pill by mouth at bedtime as needed for dizziness  30 tablet  0  . Multiple Vitamin (MULTIVITAMIN) tablet Take 1 tablet by mouth daily.        . Omega-3 Fatty Acids (FISH OIL PO) Take 1 capsule by mouth daily.        [name] OMEPRAZOLE PO Take 1 tablet by mouth daily.        . vitamin C (ASCORBIC ACID) 500 MG tablet Take 500 mg by mouth daily.        . Diphenhydramine-APAP, sleep, (TYLENOL PM EXTRA STRENGTH PO) Take by mouth as needed.            Review of Systems Review of Systems  Constitutional: Negative for fever, appetite change, fatigue and unexpected weight  change.  Eyes: Negative for pain and visual disturbance.  Respiratory: Negative for cough and shortness of breath.   Cardiovascular: Negative for cp or palpitations    Gastrointestinal: Negative for nausea, and constipation. And abd pain or blood in stool Genitourinary: Negative for urgency and frequency.  Skin: Negative for pallor or rash   Neurological: Negative for weakness, light-headedness, numbness and headaches.  Hematological: Negative for adenopathy. Does not bruise/bleed easily.  Psychiatric/Behavioral: Negative for dysphoric mood. The patient is not nervous/anxious.          Objective:   Physical Exam  Constitutional: She appears well-developed and well-nourished. No distress.       overwt and well appearing   HENT:  Head: Normocephalic and atraumatic.  Mouth/Throat: Oropharynx is clear and moist.  Eyes: Conjunctivae and EOM are normal. Pupils are equal, round, and reactive to light. No scleral icterus.  Neck: Normal range of motion. Neck supple. No JVD present. Carotid bruit is not present. No thyromegaly present.  Abdominal: Soft. Normal appearance. She exhibits no distension, no abdominal bruit and no mass. Bowel sounds are increased. There is no hepatosplenomegaly. There is no tenderness. There is no rebound, no guarding and negative Murphy's sign.  Musculoskeletal: Normal range of motion. She exhibits no edema and no tenderness.  Lymphadenopathy:    She has no cervical adenopathy.  Neurological: She is alert. She has normal strength and normal reflexes. She displays no atrophy and no tremor. No cranial nerve deficit or sensory deficit. She exhibits normal muscle tone. Coordination and gait normal.  Skin: Skin is warm and dry. No rash noted. No erythema. No pallor.  Psychiatric: She has a normal mood and affect.          Assessment & Plan:

## 2011-03-20 NOTE — Patient Instructions (Addendum)
Start citrucel fiber supplement every day as directed  Keep a diet and symptom journal for your episodes of diarrhea  See Gi in feb as planned  Increase synthroid up to 100 mcg daily  Also change from simvastatin to generic lipitor Fasting labs in 6 weeks and then follow up in around 8 weeks  If you have side effects or feel hyper/ jittery let me know

## 2011-03-20 NOTE — Assessment & Plan Note (Signed)
tsh up with fatigue Inc dose to 100 mcg Lab 6 wk and f/u  udate if side eff

## 2011-05-01 ENCOUNTER — Other Ambulatory Visit (INDEPENDENT_AMBULATORY_CARE_PROVIDER_SITE_OTHER): Payer: Medicare Other

## 2011-05-01 DIAGNOSIS — E785 Hyperlipidemia, unspecified: Secondary | ICD-10-CM

## 2011-05-01 DIAGNOSIS — E039 Hypothyroidism, unspecified: Secondary | ICD-10-CM

## 2011-05-01 LAB — LIPID PANEL
HDL: 45.6 mg/dL (ref >39.00)
Triglycerides: 80 mg/dL (ref 0.0–149.0)
VLDL: 16 mg/dL (ref 0.0–40.0)

## 2011-05-15 ENCOUNTER — Encounter: Payer: Self-pay | Admitting: Family Medicine

## 2011-05-15 ENCOUNTER — Ambulatory Visit (INDEPENDENT_AMBULATORY_CARE_PROVIDER_SITE_OTHER): Payer: Medicare Other | Admitting: Family Medicine

## 2011-05-15 VITALS — BP 100/64 | HR 64 | Temp 97.9°F | Ht 63.0 in | Wt 161.8 lb

## 2011-05-15 DIAGNOSIS — E785 Hyperlipidemia, unspecified: Secondary | ICD-10-CM

## 2011-05-15 DIAGNOSIS — F329 Major depressive disorder, single episode, unspecified: Secondary | ICD-10-CM

## 2011-05-15 DIAGNOSIS — E039 Hypothyroidism, unspecified: Secondary | ICD-10-CM

## 2011-05-15 DIAGNOSIS — R197 Diarrhea, unspecified: Secondary | ICD-10-CM

## 2011-05-15 MED ORDER — LEVOTHYROXINE SODIUM 88 MCG PO TABS: 88.0000 ug | ORAL_TABLET | Freq: Every day | ORAL | Status: DC

## 2011-05-15 NOTE — Assessment & Plan Note (Signed)
From IBS - much better with daily fiber and align Will fu GI - Dr Matthias Hughs soon and also disc chronic LLQ pain  colonosc 3 y ago

## 2011-05-15 NOTE — Patient Instructions (Signed)
Continue fiber and align  Decrease synthroid to 88 mcg daily  Non fasting lab in 6 weeks Follow up in 6 months for annual exam with labs prior

## 2011-05-15 NOTE — Assessment & Plan Note (Signed)
Improved with change to lipitor 20 Disc goals for lipids and reasons to control them Rev labs with pt Rev low sat fat diet in detail

## 2011-05-15 NOTE — Assessment & Plan Note (Signed)
tsh low after inc in dose Will take down to 88 mcg Lab 6 wk

## 2011-05-15 NOTE — Progress Notes (Signed)
Subjective:    Patient ID: Frances Hoffman, female    DOB: December 04, 1940, 71 y.o.   MRN: [id number]  HPI Here for f/u of multiple issues   depressoin - on celexa Is feeling much better on that and is more affordible Is active with more motivation   IBS- last visit diarrhea and rec fiber Has not had any diarrhea since Also Dr Matthias Hughs gave her align - that helped too  Her L side always bothers her  ? IBS -- is keeping up with colonosc 3 y ago    Lipids better on lipitor 20 Lab Results  Component Value Date   CHOL 173 05/01/2011   CHOL 231* 03/15/2011   CHOL 211* 05/05/2010   Lab Results  Component Value Date   HDL 45.60 05/01/2011   HDL 40.98 03/15/2011   HDL 11.91 05/05/2010   Lab Results  Component Value Date   LDLCALC 111* 05/01/2011   LDLCALC 106* 02/11/2009   LDLCALC 112* 07/01/2008   Lab Results  Component Value Date   TRIG 80.0 05/01/2011   TRIG 127.0 03/15/2011   TRIG 185.0* 05/05/2010   Lab Results  Component Value Date   CHOLHDL 4 05/01/2011   CHOLHDL 5 03/15/2011   CHOLHDL 5 05/05/2010   Lab Results  Component Value Date   LDLDIRECT 151.8 03/15/2011   LDLDIRECT 138.5 05/05/2010   LDLDIRECT 139.9 03/07/2010   improved across the board -- is doing fine with lipitor  No side eff Diet generally good  Does square dancing for exercise   Thyroid - did inc dose for inc tsh Lab Results  Component Value Date   TSH 0.21* 05/01/2011   this is a bit low Clinically-- feels a little edgy Was on 50 and now 100   Patient Active Problem List  Diagnoses  . HYPOTHYROIDISM  . HYPERLIPIDEMIA  . DEPRESSION  . DRY MOUTH  . GERD  . UTI'S, RECURRENT  . ADHESIVE CAPSULITIS  . OSTEOPENIA  . FATIGUE  . WEIGHT GAIN  . PELVIC PAIN, LEFT  . NEOPLASM, MALIGNANT, BREAST, HX OF  . Insomnia  . Dizzy  . Diarrhea   Past Medical History  Diagnosis Date  . Cancer 2000    breast had lumpectomy/radiation x36,mammo ,no chemo  . Depression   . GERD (gastroesophageal reflux disease)   .  Hyperlipidemia   . Hypothyroid   . Frozen shoulder   . Osteopenia     BMD 2004, WNL 2008  . Shingles     chronic body pain, left side of body  . Dysuria-frequency syndrome     takes AZO  . Zoster     Hx of   Past Surgical History  Procedure Date  . Breast surgery 2000    lumpectomy   History  Substance Use Topics  . Smoking status: Never Smoker   . Smokeless tobacco: Not on file  . Alcohol Use: No   Family History  Problem Relation Age of Onset  . Osteoporosis Mother   . Parkinsonism Father   . Cancer Sister     breast CA  . Cancer Other     breast CA   Allergies  Allergen Reactions  . Celexa     Intrusive/ weird thoughts   . Ciprofloxacin Hcl     Dizzy/ nauseated feels bad    Current Outpatient Prescriptions on File Prior to Visit  Medication Sig Dispense Refill  . atorvastatin (LIPITOR) 20 MG tablet Take 1 tablet (20 mg total) by mouth daily.  30 tablet  11  . Calcium 1500 MG tablet Take 1,500 mg by mouth.        . Cholecalciferol (VITAMIN D3) 2000 UNITS capsule Take 2,000 Units by mouth daily.        . Diphenhydramine-APAP, sleep, (TYLENOL PM EXTRA STRENGTH PO) Take by mouth as needed.        [name] FLUoxetine (PROZAC) 20 MG tablet Take 1 tablet (20 mg total) by mouth daily.  30 tablet  11  . meclizine (ANTIVERT) 25 MG tablet One pill by mouth at bedtime as needed for dizziness  30 tablet  0  . Multiple Vitamin (MULTIVITAMIN) tablet Take 1 tablet by mouth daily.        . Omega-3 Fatty Acids (FISH OIL PO) Take 1 capsule by mouth daily.        [name] OMEPRAZOLE PO Take 1 tablet by mouth daily.        . vitamin C (ASCORBIC ACID) 500 MG tablet Take 500 mg by mouth daily.           Review of Systems Review of Systems  Constitutional: Negative for fever, appetite change, fatigue and unexpected weight change.  Eyes: Negative for pain and visual disturbance.  Respiratory: Negative for cough and shortness of breath.   Cardiovascular: Negative for cp or palpitations      Gastrointestinal: Negative for nausea, diarrhea and constipation.  Genitourinary: Negative for urgency and frequency.  Skin: Negative for pallor or rash   Neurological: Negative for weakness, light-headedness, numbness and headaches.  Hematological: Negative for adenopathy. Does not bruise/bleed easily.  Psychiatric/Behavioral: Negative for dysphoric mood. The patient is not nervous/anxious.          Objective:   Physical Exam  Constitutional: She appears well-developed and well-nourished. No distress.       overwt and well appearing   HENT:  Head: Normocephalic and atraumatic.  Mouth/Throat: Oropharynx is clear and moist.  Eyes: Conjunctivae and EOM are normal. Pupils are equal, round, and reactive to light. No scleral icterus.  Neck: Normal range of motion. Neck supple. No JVD present. No thyromegaly present.  Cardiovascular: Normal rate, regular rhythm, normal heart sounds and intact distal pulses.  Exam reveals no gallop.   Pulmonary/Chest: Effort normal and breath sounds normal. No respiratory distress. She has no wheezes. She exhibits no tenderness.  Abdominal: Soft. Bowel sounds are normal. She exhibits no distension and no mass. There is no tenderness.  Musculoskeletal: Normal range of motion. She exhibits no edema.  Lymphadenopathy:    She has no cervical adenopathy.  Neurological: She is alert. She has normal reflexes. No cranial nerve deficit. She exhibits normal muscle tone. Coordination normal.  Skin: Skin is warm and dry. No rash noted. No erythema. No pallor.  Psychiatric: She has a normal mood and affect.          Assessment & Plan:

## 2011-05-15 NOTE — Assessment & Plan Note (Signed)
Doing well on celexa with no problems - and more affordible

## 2011-05-28 ENCOUNTER — Telehealth: Payer: Self-pay | Admitting: Family Medicine

## 2011-05-28 NOTE — Telephone Encounter (Signed)
[facility] Triage Call Report Triage Record Num: [id number] Operator: Tomasita Crumble Patient Name: Frances Hoffman Call Date & Time: 05/28/2011 1:59:30PM Patient Phone: [phone] PCP: Audrie Gallus. Tower Patient Gender: Female PCP Fax : Patient DOB: [birth year] Practice Name: [facility] Reason for Call: [name]: Quorra/Patient; PCP: Roxy Manns A.; CB#: [phone]; Call regarding Left Side Pain in abdomen and flank area. Onset unclear. Pain rated at 4-5 of 10; "not real bad"; intermittent. Emergent sx ruled out. See in 2 weeks per Abdominal Pain protocol. Home care for the interim. [name] states she would like to have Pap Smear done. Transferred to [facility] at office for appointment assistance. Protocol(s) Used: Abdominal Pain Recommended Outcome per Protocol: See Provider within 2 Weeks Reason for Outcome: Repeated episodes of abdominal discomfort AND no previous evaluation by provider Care Advice: Avoid foods that may be responsible for increased gas production (such as dairy products, raw vegetables, nuts, spicy or fried foods, etc.). ~ ~ Eliminate or decrease the intake of alcoholic or caffeinated beverages. 05/28/2011 2:15:09PM Page 1 of 1

## 2011-05-28 NOTE — Telephone Encounter (Signed)
[name]: Markel/Patient; PCP: Roxy Manns A.; CB#: [phone]; Call regarding Left  Side Pain in abdomen and flank area.  Onset unclear.  Pain rated at 4-5 of 10; "not real bad"; intermittent. Emergent sx ruled out.  [name] states she would like to have Pap Smear done.  Transferred to [facility] at office for appointment assistance.

## 2011-05-28 NOTE — Telephone Encounter (Signed)
Patient has an appt to see Dr. Milinda Antis tomorrow morning.

## 2011-05-28 NOTE — Telephone Encounter (Signed)
Will see in am

## 2011-05-29 ENCOUNTER — Ambulatory Visit (INDEPENDENT_AMBULATORY_CARE_PROVIDER_SITE_OTHER): Payer: Medicare Other | Admitting: Family Medicine

## 2011-05-29 ENCOUNTER — Encounter: Payer: Self-pay | Admitting: Family Medicine

## 2011-05-29 VITALS — BP 106/62 | HR 62 | Temp 98.1°F | Ht 63.0 in | Wt 160.2 lb

## 2011-05-29 DIAGNOSIS — R1032 Left lower quadrant pain: Secondary | ICD-10-CM

## 2011-05-29 DIAGNOSIS — R109 Unspecified abdominal pain: Secondary | ICD-10-CM

## 2011-05-29 LAB — POCT URINALYSIS DIPSTICK
Ketones, UA: NEGATIVE
Leukocytes, UA: NEGATIVE
Nitrite, UA: NEGATIVE
Protein, UA: NEGATIVE
pH, UA: 6.5

## 2011-05-29 NOTE — Assessment & Plan Note (Addendum)
Ongoing for years - but worsening Nl pelvic exam today ua today negative  CT in 07 showed reactive mesenteric LN  colonosc 2010 nl  Last cbc ok  Will f/u with GI - Dr Matthias Hughs ? Consider another colonoscopy or study Did enc pt to drink fluids and eat high fiber diet

## 2011-05-29 NOTE — Assessment & Plan Note (Signed)
Reviewed nl pelvic US from 1/12 and also did pelvic with speculum exam today- normal without tenderness or pain  ua today  Suspect GI cause- will f/u with GI Of note- her sister did have ovarian cancer

## 2011-05-29 NOTE — Progress Notes (Signed)
Subjective:    Patient ID: Frances Hoffman, female    DOB: [birth year], 71 y.o.   MRN: [id number]  HPI Is here for side pain -- L side - low abdomen - radiation to the back  Going on 2-3 years  Is really worried about it - and wants peace of mind   Has seen Dr Matthias Hughs-- and has appt for f/u  He does not know what is going on  Normal colonoscopy was 3 years ago  Has had pelvic ultrasound - was ok   Yesterday had a bm -normal  Had a little spot in panties after -  ? If leakage or blood   Nl pelvic US Nl CT in 2007 with one LN in L mesenteric area - thought to be reactive Nl colonosc in 2010  (no diverticulosis noted )   Is eating fiber and drinking fluids  Takes a fiber pill  Has bm every day  No blood that she can see at all   No other symptoms   Pain is on and off/ not constant - not severe , is dull   No urinary symptoms -- does not think she has a uti  Some overactive bladder symptoms  Never had a kidney stone in the past   She is very worried about gyn cancer and wants to have a pelvic exam today  ua is negative today  Patient Active Problem List  Diagnoses  . HYPOTHYROIDISM  . HYPERLIPIDEMIA  . DEPRESSION  . DRY MOUTH  . GERD  . UTI'S, RECURRENT  . ADHESIVE CAPSULITIS  . OSTEOPENIA  . FATIGUE  . WEIGHT GAIN  . PELVIC PAIN, LEFT  . NEOPLASM, MALIGNANT, BREAST, HX OF  . Insomnia  . Dizzy  . Diarrhea  . Left lower quadrant pain   Past Medical History  Diagnosis Date  . Cancer 2000    breast had lumpectomy/radiation x36,mammo ,no chemo  . Depression   . GERD (gastroesophageal reflux disease)   . Hyperlipidemia   . Hypothyroid   . Frozen shoulder   . Osteopenia     BMD 2004, WNL 2008  . Shingles     chronic body pain, left side of body  . Dysuria-frequency syndrome     takes AZO  . Zoster     Hx of   Past Surgical History  Procedure Date  . Breast surgery 2000    lumpectomy   History  Substance Use Topics  . Smoking status: Never Smoker    . Smokeless tobacco: Not on file  . Alcohol Use: No   Family History  Problem Relation Age of Onset  . Osteoporosis Mother   . Parkinsonism Father   . Cancer Sister     breast CA  . Cancer Other     breast CA   Allergies  Allergen Reactions  . Celexa     Intrusive/ weird thoughts   . Ciprofloxacin Hcl     Dizzy/ nauseated feels bad    Current Outpatient Prescriptions on File Prior to Visit  Medication Sig Dispense Refill  . atorvastatin (LIPITOR) 20 MG tablet Take 1 tablet (20 mg total) by mouth daily.  30 tablet  11  . Calcium 1500 MG tablet Take 1,500 mg by mouth.        . Cholecalciferol (VITAMIN D3) 2000 UNITS capsule Take 2,000 Units by mouth daily.        [name] FLUoxetine (PROZAC) 20 MG tablet Take 1 tablet (20 mg total) by mouth daily.  30 tablet  11  . levothyroxine (SYNTHROID, LEVOTHROID) 88 MCG tablet Take 1 tablet (88 mcg total) by mouth daily.  30 tablet  11  . meclizine (ANTIVERT) 25 MG tablet One pill by mouth at bedtime as needed for dizziness  30 tablet  0  . Multiple Vitamin (MULTIVITAMIN) tablet Take 1 tablet by mouth daily.        . Omega-3 Fatty Acids (FISH OIL PO) Take 1 capsule by mouth daily.        [name] OMEPRAZOLE PO Take 1 tablet by mouth daily.            Review of Systems Review of Systems  Constitutional: Negative for fever, appetite change, fatigue and unexpected weight change.  Eyes: Negative for pain and visual disturbance.  Respiratory: Negative for cough and shortness of breath.   Cardiovascular: Negative for cp or palpitations    Gastrointestinal: Negative for nausea, or vomiting, occ pos for constipation or loose stool, neg for blood in stool  Genitourinary: Negative for urgency and frequency.  Skin: Negative for pallor or rash   Neurological: Negative for weakness, light-headedness, numbness and headaches.  Hematological: Negative for adenopathy. Does not bruise/bleed easily.  Psychiatric/Behavioral: Negative for dysphoric mood. The patient  is not nervous/anxious.          Objective:   Physical Exam  Constitutional: She appears well-developed and well-nourished. No distress.  HENT:  Head: Normocephalic and atraumatic.  Mouth/Throat: Oropharynx is clear and moist.  Eyes: Conjunctivae and EOM are normal. Pupils are equal, round, and reactive to light. Right eye exhibits no discharge. Left eye exhibits no discharge. No scleral icterus.  Neck: Normal range of motion. Neck supple. No JVD present. Carotid bruit is not present. No thyromegaly present.  Cardiovascular: Normal rate, regular rhythm, normal heart sounds and intact distal pulses.  Exam reveals no gallop.   Pulmonary/Chest: Effort normal and breath sounds normal. No respiratory distress. She has no wheezes.  Abdominal: Soft. Bowel sounds are normal. She exhibits no distension, no abdominal bruit and no mass. There is tenderness. There is no rebound and no guarding.       Mild LLQ tenderness -into the pelvic area without rebound or gaurding  No CVA tenderness No pain on internal exam  Genitourinary: Rectum normal, vagina normal and uterus normal. There is no rash, tenderness or lesion on the right labia. There is no rash, tenderness or lesion on the left labia. Uterus is not enlarged, not fixed and not tender. Cervix exhibits no motion tenderness, no discharge and no friability. Right adnexum displays no mass, no tenderness and no fullness. Left adnexum displays no mass, no tenderness and no fullness. No bleeding around the vagina. No vaginal discharge found.  Musculoskeletal: Normal range of motion. She exhibits no edema and no tenderness.  Lymphadenopathy:    She has no cervical adenopathy.  Neurological: She is alert. She has normal reflexes. No cranial nerve deficit. She exhibits normal muscle tone. Coordination normal.  Skin: Skin is warm and dry. No rash noted. No erythema. No pallor.  Psychiatric: She has a normal mood and affect.          Assessment & Plan:

## 2011-05-29 NOTE — Patient Instructions (Signed)
Exam is re assuring today Pelvic ultrasound was normal  See Dr Matthias Hughs as planned to discuss symptoms - he may want to consider another colonoscopy or CT scan Stress to him your symptoms are worse  Make sure to review the last CT and colonoscopy with him  If symptoms worsen further let me know  Continue high fiber diet and lots of fluids

## 2011-06-26 ENCOUNTER — Other Ambulatory Visit: Payer: Medicare Other

## 2011-06-28 ENCOUNTER — Telehealth: Payer: Self-pay | Admitting: Family Medicine

## 2011-06-28 ENCOUNTER — Other Ambulatory Visit (INDEPENDENT_AMBULATORY_CARE_PROVIDER_SITE_OTHER): Payer: Medicare Other

## 2011-06-28 DIAGNOSIS — E039 Hypothyroidism, unspecified: Secondary | ICD-10-CM

## 2011-06-28 LAB — TSH: TSH: 0.07 u[IU]/mL — ABNORMAL LOW (ref 0.35–5.50)

## 2011-06-28 MED ORDER — LEVOTHYROXINE SODIUM 75 MCG PO TABS: 75.0000 ug | ORAL_TABLET | Freq: Every day | ORAL | Status: DC

## 2011-06-28 NOTE — Telephone Encounter (Signed)
tsh is still low -- need to decrease dose again from 88 to 75 mcg daily Will send elect to her pharmacy Check tsh/ fT4 in 6 weeks please

## 2011-06-29 NOTE — Telephone Encounter (Signed)
Patient advised as instructed via telephone, she will call back to schedule lab appt.

## 2011-08-09 ENCOUNTER — Other Ambulatory Visit (INDEPENDENT_AMBULATORY_CARE_PROVIDER_SITE_OTHER): Payer: Medicare Other

## 2011-08-09 DIAGNOSIS — E039 Hypothyroidism, unspecified: Secondary | ICD-10-CM

## 2011-08-13 ENCOUNTER — Other Ambulatory Visit: Payer: Self-pay | Admitting: Family Medicine

## 2011-08-13 NOTE — Telephone Encounter (Signed)
Patient stated was in office and had thyroid checked and has questions about medicine refill for Levothyroxin and asked for a return call to [phone]

## 2011-08-14 NOTE — Telephone Encounter (Signed)
Spoke w/patient regarding lab results for Thyroid/SLS

## 2011-08-30 ENCOUNTER — Encounter: Payer: Self-pay | Admitting: Family Medicine

## 2011-08-30 ENCOUNTER — Ambulatory Visit (INDEPENDENT_AMBULATORY_CARE_PROVIDER_SITE_OTHER): Payer: Medicare Other | Admitting: Family Medicine

## 2011-08-30 ENCOUNTER — Telehealth: Payer: Self-pay | Admitting: Family Medicine

## 2011-08-30 VITALS — BP 100/64 | HR 76 | Temp 97.9°F | Wt 158.0 lb

## 2011-08-30 DIAGNOSIS — N390 Urinary tract infection, site not specified: Secondary | ICD-10-CM

## 2011-08-30 LAB — POCT URINALYSIS DIPSTICK
Bilirubin, UA: NEGATIVE
Ketones, UA: NEGATIVE
Spec Grav, UA: <=1.005
pH, UA: 7.5

## 2011-08-30 MED ORDER — CIPROFLOXACIN HCL 500 MG PO TABS: 500.0000 mg | ORAL_TABLET | Freq: Two times a day (BID) | ORAL | Status: DC

## 2011-08-30 MED ORDER — CIPROFLOXACIN HCL 500 MG PO TABS: 500.0000 mg | ORAL_TABLET | Freq: Two times a day (BID) | ORAL | Status: AC

## 2011-08-30 NOTE — Patient Instructions (Addendum)
Nice to meet you. Please take cipro as directed- 1 tablet twice daily for 5 days. We will call you once we receive the results of your urine culture.

## 2011-08-30 NOTE — Telephone Encounter (Signed)
[name]: Frances Hoffman/Patient; PCP: Roxy Manns A.; CB#: [phone]; ; ; Call regarding Urinary Burning, Frequency W/ Chills; onset 6-26.  Afebrile.  Pt took left over Cipro prior to call from previous UTI.  All emergent sxs r/o per urinary symptoms protocol. Appt scheduled on 6-27.  Pt verbalized understanding.

## 2011-08-30 NOTE — Progress Notes (Signed)
SUBJECTIVE: Frances Hoffman is a 71 y.o. female who complains of urinary frequency, urgency and dysuria x 4 days, without flank pain, fever, chills, or abnormal vaginal discharge or bleeding.   Patient Active Problem List  Diagnosis  . HYPOTHYROIDISM  . HYPERLIPIDEMIA  . DEPRESSION  . DRY MOUTH  . GERD  . UTI'S, RECURRENT  . ADHESIVE CAPSULITIS  . OSTEOPENIA  . FATIGUE  . WEIGHT GAIN  . PELVIC PAIN, LEFT  . NEOPLASM, MALIGNANT, BREAST, HX OF  . Insomnia  . Dizzy  . Diarrhea  . Left lower quadrant pain   Past Medical History  Diagnosis Date  . Cancer 2000    breast had lumpectomy/radiation x36,mammo ,no chemo  . Depression   . GERD (gastroesophageal reflux disease)   . Hyperlipidemia   . Hypothyroid   . Frozen shoulder   . Osteopenia     BMD 2004, WNL 2008  . Shingles     chronic body pain, left side of body  . Dysuria-frequency syndrome     takes AZO  . Zoster     Hx of   Past Surgical History  Procedure Date  . Breast surgery 2000    lumpectomy   History  Substance Use Topics  . Smoking status: Never Smoker   . Smokeless tobacco: Not on file  . Alcohol Use: No   Family History  Problem Relation Age of Onset  . Osteoporosis Mother   . Parkinsonism Father   . Cancer Sister     breast CA  . Cancer Other     breast CA   Allergies  Allergen Reactions  . Citalopram Hydrobromide     Intrusive/ weird thoughts    Current Outpatient Prescriptions on File Prior to Visit  Medication Sig Dispense Refill  . atorvastatin (LIPITOR) 20 MG tablet Take 1 tablet (20 mg total) by mouth daily.  30 tablet  11  . Calcium 1500 MG tablet Take 1,500 mg by mouth.       . Cholecalciferol (VITAMIN D3) 2000 UNITS capsule Take 2,000 Units by mouth daily.        [name] levothyroxine (SYNTHROID, LEVOTHROID) 75 MCG tablet Take 1 tablet (75 mcg total) by mouth daily.  30 tablet  3  . meclizine (ANTIVERT) 25 MG tablet One pill by mouth at bedtime as needed for dizziness  30 tablet  0  .  Multiple Vitamin (MULTIVITAMIN) tablet Take 1 tablet by mouth daily.        . Omega-3 Fatty Acids (FISH OIL PO) Take 1 capsule by mouth daily.        [name] OMEPRAZOLE PO Take 1 tablet by mouth daily.        [name] DISCONTD: FLUoxetine (PROZAC) 20 MG tablet Take 1 tablet (20 mg total) by mouth daily.  30 tablet  11  . DISCONTD: Diphenhydramine-APAP, sleep, (TYLENOL PM EXTRA STRENGTH PO) Take by mouth as needed.         The PMH, PSH, Social History, Family History, Medications, and allergies have been reviewed in [facility], and have been updated if relevant.  OBJECTIVE: BP 100/64  Pulse 76  Temp 97.9 F (36.6 C)  Wt 158 lb (71.668 kg)   Appears well, in no apparent distress.  Vital signs are normal. The abdomen is soft without tenderness, guarding, mass, rebound or organomegaly. No CVA tenderness or inguinal adenopathy noted. Urine dipstick shows positive for RBC's and positive for leukocytes.    ASSESSMENT: UTI uncomplicated without evidence of pyelonephritis  PLAN: Treatment  per orders - also push fluids, may use Pyridium OTC prn. Call or return to clinic prn if these symptoms worsen or fail to improve as anticipated.

## 2011-09-01 LAB — URINE CULTURE: Organism ID, Bacteria: NO GROWTH

## 2011-10-30 ENCOUNTER — Telehealth: Payer: Self-pay

## 2011-10-30 NOTE — Telephone Encounter (Signed)
Pt having frequency of urine with pain when urinates and lower abdominal pain; no fever. Pt cannot come today for appt; in [place] now. Pt scheduled 10/31/11 at 8:45 am. If pt condition changes or worsens will got to UC. Pt understands.

## 2011-10-30 NOTE — Telephone Encounter (Signed)
I will see her then  

## 2011-10-31 ENCOUNTER — Encounter: Payer: Self-pay | Admitting: Family Medicine

## 2011-10-31 ENCOUNTER — Ambulatory Visit (INDEPENDENT_AMBULATORY_CARE_PROVIDER_SITE_OTHER): Payer: Medicare Other | Admitting: Family Medicine

## 2011-10-31 VITALS — BP 120/69 | HR 83 | Temp 97.6°F | Ht 64.0 in | Wt 161.2 lb

## 2011-10-31 DIAGNOSIS — N390 Urinary tract infection, site not specified: Secondary | ICD-10-CM

## 2011-10-31 DIAGNOSIS — R350 Frequency of micturition: Secondary | ICD-10-CM

## 2011-10-31 LAB — POCT URINALYSIS DIPSTICK
Bilirubin, UA: NEGATIVE
Glucose, UA: NEGATIVE
Nitrite, UA: POSITIVE
pH, UA: 7.5

## 2011-10-31 LAB — POCT UA - MICROSCOPIC ONLY: Casts, Ur, LPF, POC: 0

## 2011-10-31 MED ORDER — CIPROFLOXACIN HCL 250 MG PO TABS: 250.0000 mg | ORAL_TABLET | Freq: Two times a day (BID) | ORAL | Status: DC

## 2011-10-31 NOTE — Progress Notes (Signed)
Subjective:    Patient ID: Frances Hoffman, female    DOB: 01-Sep-1940, 71 y.o.   MRN: [id number]  HPI  Here for uti Pos ua today   Last one was June 27 But cx was neg cipro still took her symptoms away   Symptoms -- tend to be worse later in the day  Soda occ- mostly drinks water - lots of it  Now having low abd/ bladder pain  Side was bothering her L -- given align by Dr Matthias Hughs and it helped (? GI) Sat - frequency of urination Some mucous in her urine  No blood  No burning to urinate -- just uncomfortable , and tingly feeling  Frequency and urgency  Patient Active Problem List  Diagnosis  . HYPOTHYROIDISM  . HYPERLIPIDEMIA  . DEPRESSION  . DRY MOUTH  . GERD  . UTI'S, RECURRENT  . ADHESIVE CAPSULITIS  . OSTEOPENIA  . FATIGUE  . WEIGHT GAIN  . PELVIC PAIN, LEFT  . NEOPLASM, MALIGNANT, BREAST, HX OF  . Insomnia  . Dizzy  . Diarrhea  . Left lower quadrant pain   Past Medical History  Diagnosis Date  . Cancer 2000    breast had lumpectomy/radiation x36,mammo ,no chemo  . Depression   . GERD (gastroesophageal reflux disease)   . Hyperlipidemia   . Hypothyroid   . Frozen shoulder   . Osteopenia     BMD 2004, WNL 2008  . Shingles     chronic body pain, left side of body  . Dysuria-frequency syndrome     takes AZO  . Zoster     Hx of   Past Surgical History  Procedure Date  . Breast surgery 2000    lumpectomy   History  Substance Use Topics  . Smoking status: Never Smoker   . Smokeless tobacco: Not on file  . Alcohol Use: No   Family History  Problem Relation Age of Onset  . Osteoporosis Mother   . Parkinsonism Father   . Cancer Sister     breast CA  . Cancer Other     breast CA   Allergies  Allergen Reactions  . Citalopram Hydrobromide     Intrusive/ weird thoughts    Current Outpatient Prescriptions on File Prior to Visit  Medication Sig Dispense Refill  . atorvastatin (LIPITOR) 20 MG tablet Take 1 tablet (20 mg total) by mouth daily.   30 tablet  11  . Calcium 1500 MG tablet Take 1,500 mg by mouth.       . Cholecalciferol (VITAMIN D3) 2000 UNITS capsule Take 2,000 Units by mouth daily.        [name] FLUoxetine (PROZAC) 20 MG tablet Take 20 mg by mouth daily.      [name] levothyroxine (SYNTHROID, LEVOTHROID) 75 MCG tablet Take 1 tablet (75 mcg total) by mouth daily.  30 tablet  3  . meclizine (ANTIVERT) 25 MG tablet One pill by mouth at bedtime as needed for dizziness  30 tablet  0  . Multiple Vitamin (MULTIVITAMIN) tablet Take 1 tablet by mouth daily.        . Omega-3 Fatty Acids (FISH OIL PO) Take 1 capsule by mouth daily.        [name] OMEPRAZOLE PO Take 1 tablet by mouth daily.        [name] DISCONTD: Diphenhydramine-APAP, sleep, (TYLENOL PM EXTRA STRENGTH PO) Take by mouth as needed.           Review of Systems Review of Systems  Constitutional: Negative for fever, appetite change, fatigue and unexpected weight change.  Eyes: Negative for pain and visual disturbance.  Respiratory: Negative for cough and shortness of breath.   Cardiovascular: Negative for cp or palpitations    Gastrointestinal: Negative for nausea, diarrhea and constipation.  Genitourinary: pos  for urgency and frequency. neg for blood in urine  Skin: Negative for pallor or rash   Neurological: Negative for weakness, light-headedness, numbness and headaches.  Hematological: Negative for adenopathy. Does not bruise/bleed easily.  Psychiatric/Behavioral: Negative for dysphoric mood. The patient is not nervous/anxious.         Objective:   Physical Exam  Constitutional: She appears well-developed and well-nourished. No distress.  HENT:  Head: Normocephalic and atraumatic.  Mouth/Throat: Oropharynx is clear and moist.  Eyes: Conjunctivae and EOM are normal. Pupils are equal, round, and reactive to light. No scleral icterus.  Neck: Normal range of motion. Neck supple. No JVD present. No thyromegaly present.  Cardiovascular: Normal rate, regular rhythm and normal  heart sounds.   Pulmonary/Chest: Effort normal and breath sounds normal.  Abdominal: Soft. Bowel sounds are normal. She exhibits no distension and no mass. There is no tenderness. There is no rebound and no guarding.  Musculoskeletal: She exhibits no edema.       No cva tenderness   Lymphadenopathy:    She has no cervical adenopathy.  Skin: Skin is warm and dry. No rash noted.  Psychiatric: She has a normal mood and affect.          Assessment & Plan:

## 2011-10-31 NOTE — Assessment & Plan Note (Signed)
Long disc about frequent utis and urinary symptoms  Last cx neg- suprisingly  This ua pos- tx with cipro and cx  Disc ways to prev utis in detail Will drink water only  If continued problems- I do want to go ahead and ref to urology- pt understands

## 2011-10-31 NOTE — Patient Instructions (Signed)
Keep drinking water  Avoid other beverages- esp diet drinks or crystal light  Take cipro as directed  Update if not starting to improve in several  or if worsening   We will send your urine for culture and update you  I am leaning towards a urology consult if things do not improve

## 2011-11-03 LAB — URINE CULTURE: Colony Count: >=100000

## 2011-11-06 ENCOUNTER — Telehealth: Payer: Self-pay

## 2011-11-06 ENCOUNTER — Telehealth: Payer: Self-pay | Admitting: Family Medicine

## 2011-11-06 MED ORDER — CIPROFLOXACIN HCL 250 MG PO TABS: 250.0000 mg | ORAL_TABLET | Freq: Two times a day (BID) | ORAL | Status: DC

## 2011-11-06 MED ORDER — CIPROFLOXACIN HCL 250 MG PO TABS: 250.0000 mg | ORAL_TABLET | Freq: Two times a day (BID) | ORAL | Status: AC

## 2011-11-06 NOTE — Telephone Encounter (Signed)
Let's go ahead and extend the cipro for 5 more days Px written for call in  (or elect if you know what pharmacy she wants) Then f/u with me in 7-10 days for visit and we will re check urine Keep drinking water

## 2011-11-06 NOTE — Telephone Encounter (Signed)
Message copied by [name] on Tue Nov 06, 2011  4:58 PM ------      Message from: Frances Hoffman      Created: Tue Nov 06, 2011 12:37 PM       Patient notified.  She stated she is not hurting as bad but stated "something is still not right".  She has one day left on the antibiotic.  Please advise.

## 2011-11-06 NOTE — Telephone Encounter (Signed)
Patient notified, Rx eprescribed to [facility].  Patient made an appt for next week.

## 2011-11-06 NOTE — Telephone Encounter (Signed)
Pt left v/m requesting call back re: urine culture.

## 2011-11-07 NOTE — Telephone Encounter (Signed)
I spoke with patient last night and she made an appt for a recheck next week.  This voicemail must have been before I spoke with her.

## 2011-11-12 ENCOUNTER — Telehealth: Payer: Self-pay | Admitting: Family Medicine

## 2011-11-12 DIAGNOSIS — E785 Hyperlipidemia, unspecified: Secondary | ICD-10-CM

## 2011-11-12 DIAGNOSIS — K219 Gastro-esophageal reflux disease without esophagitis: Secondary | ICD-10-CM

## 2011-11-12 DIAGNOSIS — E039 Hypothyroidism, unspecified: Secondary | ICD-10-CM

## 2011-11-12 NOTE — Telephone Encounter (Signed)
Message copied by [name] on Mon Nov 12, 2011  5:56 PM ------      Message from: Alvina Chou      Created: Mon Nov 12, 2011  3:34 PM      Regarding: Lab orders for, 9.10.13       Fasting labs, Thanks, T

## 2011-11-14 ENCOUNTER — Encounter: Payer: Self-pay | Admitting: Family Medicine

## 2011-11-14 ENCOUNTER — Ambulatory Visit (INDEPENDENT_AMBULATORY_CARE_PROVIDER_SITE_OTHER): Payer: Medicare Other | Admitting: Family Medicine

## 2011-11-14 ENCOUNTER — Other Ambulatory Visit (INDEPENDENT_AMBULATORY_CARE_PROVIDER_SITE_OTHER): Payer: Medicare Other

## 2011-11-14 VITALS — BP 126/70 | HR 72 | Temp 97.7°F | Ht 64.0 in | Wt 158.0 lb

## 2011-11-14 DIAGNOSIS — E785 Hyperlipidemia, unspecified: Secondary | ICD-10-CM

## 2011-11-14 DIAGNOSIS — N390 Urinary tract infection, site not specified: Secondary | ICD-10-CM

## 2011-11-14 DIAGNOSIS — E039 Hypothyroidism, unspecified: Secondary | ICD-10-CM

## 2011-11-14 DIAGNOSIS — K219 Gastro-esophageal reflux disease without esophagitis: Secondary | ICD-10-CM

## 2011-11-14 LAB — POCT URINALYSIS DIPSTICK
Glucose, UA: NEGATIVE
Nitrite, UA: NEGATIVE
Urobilinogen, UA: 0.2

## 2011-11-14 LAB — CBC WITH DIFFERENTIAL/PLATELET
Basophils Relative: 0.4 % (ref 0.0–3.0)
Eosinophils Absolute: 0.1 10*3/uL (ref 0.0–0.7)
HCT: 41.4 % (ref 36.0–46.0)
Lymphs Abs: 1.7 10*3/uL (ref 0.7–4.0)
MCHC: 33.7 g/dL (ref 30.0–36.0)
MCV: 91.9 fl (ref 78.0–100.0)
Monocytes Absolute: 0.6 10*3/uL (ref 0.1–1.0)
Neutrophils Relative %: 64.2 % (ref 43.0–77.0)
Platelets: 245 10*3/uL (ref 150.0–400.0)
RBC: 4.5 Mil/uL (ref 3.87–5.11)

## 2011-11-14 NOTE — Assessment & Plan Note (Signed)
Improved - no longer has any symptoms from last e coli uti tx with cipro ua clear today Will f/u if symptoms return Disc ways to prevent utis If she has another uti soon- will ref to [facility] for further eval

## 2011-11-14 NOTE — Patient Instructions (Addendum)
Keep up the good work with water intake Urine is clear Update me if symptoms return  Don't forget to get your flu shot

## 2011-11-14 NOTE — Progress Notes (Signed)
Subjective:    Patient ID: Frances Hoffman, female    DOB: 05-16-1940, 71 y.o.   MRN: [id number]  HPI Here for f/u ua  Pt had a uti (e coli) in late aug- tx with cipro (cx showed sensitivity) Had one more day of abx left- still did not feel quite right  ua is totally clear No symptoms at all Drinking lots of water   Is glad she is better  These are recurrent  Patient Active Problem List  Diagnosis  . HYPOTHYROIDISM  . HYPERLIPIDEMIA  . DEPRESSION  . DRY MOUTH  . GERD  . UTI'S, RECURRENT  . ADHESIVE CAPSULITIS  . OSTEOPENIA  . FATIGUE  . WEIGHT GAIN  . PELVIC PAIN, LEFT  . NEOPLASM, MALIGNANT, BREAST, HX OF  . Insomnia  . Dizzy  . Diarrhea  . Left lower quadrant pain   Past Medical History  Diagnosis Date  . Cancer 2000    breast had lumpectomy/radiation x36,mammo ,no chemo  . Depression   . GERD (gastroesophageal reflux disease)   . Hyperlipidemia   . Hypothyroid   . Frozen shoulder   . Osteopenia     BMD 2004, WNL 2008  . Shingles     chronic body pain, left side of body  . Dysuria-frequency syndrome     takes AZO  . Zoster     Hx of   Past Surgical History  Procedure Date  . Breast surgery 2000    lumpectomy   History  Substance Use Topics  . Smoking status: Never Smoker   . Smokeless tobacco: Not on file  . Alcohol Use: No   Family History  Problem Relation Age of Onset  . Osteoporosis Mother   . Parkinsonism Father   . Cancer Sister     breast CA  . Cancer Other     breast CA   Allergies  Allergen Reactions  . Citalopram Hydrobromide     Intrusive/ weird thoughts    Current Outpatient Prescriptions on File Prior to Visit  Medication Sig Dispense Refill  . atorvastatin (LIPITOR) 20 MG tablet Take 1 tablet (20 mg total) by mouth daily.  30 tablet  11  . Calcium 1500 MG tablet Take 1,500 mg by mouth.       . Cholecalciferol (VITAMIN D3) 2000 UNITS capsule Take 2,000 Units by mouth daily.        . ciprofloxacin (CIPRO) 250 MG tablet  Take 1 tablet (250 mg total) by mouth 2 (two) times daily.  10 tablet  0  . FLUoxetine (PROZAC) 20 MG tablet Take 20 mg by mouth daily.      [name] levothyroxine (SYNTHROID, LEVOTHROID) 75 MCG tablet Take 1 tablet (75 mcg total) by mouth daily.  30 tablet  3  . meclizine (ANTIVERT) 25 MG tablet One pill by mouth at bedtime as needed for dizziness  30 tablet  0  . Multiple Vitamin (MULTIVITAMIN) tablet Take 1 tablet by mouth daily.        . Omega-3 Fatty Acids (FISH OIL PO) Take 1 capsule by mouth daily.        [name] OMEPRAZOLE PO Take 1 tablet by mouth daily.        [name] DISCONTD: Diphenhydramine-APAP, sleep, (TYLENOL PM EXTRA STRENGTH PO) Take by mouth as needed.             Review of Systems Review of Systems  Constitutional: Negative for fever, appetite change, fatigue and unexpected weight change.  Eyes: Negative for pain  and visual disturbance.  Respiratory: Negative for cough and shortness of breath.   Cardiovascular: Negative for cp or palpitations    Gastrointestinal: Negative for nausea, diarrhea and constipation.  Genitourinary: Negative for urgency and frequency. neg for bladder pain or blood in urine Skin: Negative for pallor or rash   Neurological: Negative for weakness, light-headedness, numbness and headaches.  Hematological: Negative for adenopathy. Does not bruise/bleed easily.  Psychiatric/Behavioral: Negative for dysphoric mood. The patient is not nervous/anxious.         Objective:   Physical Exam  Constitutional: She appears well-developed and well-nourished. No distress.  HENT:  Head: Normocephalic and atraumatic.  Cardiovascular: Regular rhythm.   Abdominal: Soft. Bowel sounds are normal. She exhibits no distension and no mass. There is no tenderness. There is no rebound and no guarding.       No cva tenderness  No suprapubic tenderness or fullness    Neurological: She is alert.  Skin: Skin is warm and dry. No rash noted.  Psychiatric: She has a normal mood and  affect.          Assessment & Plan:

## 2011-11-15 ENCOUNTER — Other Ambulatory Visit: Payer: Medicare Other

## 2011-11-15 LAB — COMPREHENSIVE METABOLIC PANEL
AST: 28 U/L (ref 0–37)
Albumin: 4 g/dL (ref 3.5–5.2)
Alkaline Phosphatase: 95 U/L (ref 39–117)
Potassium: 4.2 mEq/L (ref 3.5–5.1)
Sodium: 138 mEq/L (ref 135–145)
Total Protein: 7.3 g/dL (ref 6.0–8.3)

## 2011-11-15 LAB — LIPID PANEL
Total CHOL/HDL Ratio: 4
Triglycerides: 144 mg/dL (ref 0.0–149.0)

## 2011-11-23 ENCOUNTER — Ambulatory Visit (INDEPENDENT_AMBULATORY_CARE_PROVIDER_SITE_OTHER): Payer: Medicare Other | Admitting: Family Medicine

## 2011-11-23 ENCOUNTER — Encounter: Payer: Self-pay | Admitting: Family Medicine

## 2011-11-23 VITALS — BP 112/72 | HR 78 | Temp 98.1°F | Ht 63.5 in | Wt 159.8 lb

## 2011-11-23 DIAGNOSIS — E785 Hyperlipidemia, unspecified: Secondary | ICD-10-CM

## 2011-11-23 DIAGNOSIS — M899 Disorder of bone, unspecified: Secondary | ICD-10-CM

## 2011-11-23 DIAGNOSIS — Z1231 Encounter for screening mammogram for malignant neoplasm of breast: Secondary | ICD-10-CM | POA: Insufficient documentation

## 2011-11-23 DIAGNOSIS — E039 Hypothyroidism, unspecified: Secondary | ICD-10-CM

## 2011-11-23 DIAGNOSIS — M949 Disorder of cartilage, unspecified: Secondary | ICD-10-CM

## 2011-11-23 DIAGNOSIS — Z853 Personal history of malignant neoplasm of breast: Secondary | ICD-10-CM

## 2011-11-23 DIAGNOSIS — Z0000 Encounter for general adult medical examination without abnormal findings: Secondary | ICD-10-CM | POA: Insufficient documentation

## 2011-11-23 NOTE — Patient Instructions (Signed)
Avoid red meat/ fried foods/ egg yolks/ fatty breakfast meats/ butter, cheese and high fat dairy/ and shellfish  Schedule fasting lab to re check cholesterol in 2 months please We will schedule mammogram and dexa at check out Try to get 1200-1500 mg of calcium per day with at least 1000 iu of vitamin D - for bone health  If you are interested in a shingles/zoster vaccine - call your insurance to check on coverage,( you should not get it within 1 month of other vaccines) , then call us for a prescription  for it to take to a pharmacy that gives the shot   Make sure to get your flu shot Monday

## 2011-11-23 NOTE — Assessment & Plan Note (Signed)
Disc goals for lipids and reasons to control them Rev labs with pt Rev low sat fat diet in detail Chol up - will work harder on diet-continue lipitor and then re check in 2 months

## 2011-11-23 NOTE — Assessment & Plan Note (Signed)
dexa scheduled Mild Openia in past  On ca and D and does exercise

## 2011-11-23 NOTE — Assessment & Plan Note (Signed)
Over 10 y ago Will continue with screening mammo

## 2011-11-23 NOTE — Progress Notes (Signed)
Subjective:    Patient ID: Frances Hoffman, female    DOB: Jul 10, 1940, 71 y.o.   MRN: [id number]  HPIHere for check up of chronic medical conditions and to review health mt list   Is doing ok - feeling feel Nothing new going on and no questions  Hyperlipidemia Lab Results  Component Value Date   CHOL 200 11/14/2011   CHOL 173 05/01/2011   CHOL 231* 03/15/2011   Lab Results  Component Value Date   HDL 45.80 11/14/2011   HDL 78.29 05/01/2011   HDL 56.21 03/15/2011   Lab Results  Component Value Date   LDLCALC 125* 11/14/2011   LDLCALC 111* 05/01/2011   LDLCALC 106* 02/11/2009   Lab Results  Component Value Date   TRIG 144.0 11/14/2011   TRIG 80.0 05/01/2011   TRIG 127.0 03/15/2011   Lab Results  Component Value Date   CHOLHDL 4 11/14/2011   CHOLHDL 4 05/01/2011   CHOLHDL 5 03/15/2011   Lab Results  Component Value Date   LDLDIRECT 151.8 03/15/2011   LDLDIRECT 138.5 05/05/2010   LDLDIRECT 139.9 03/07/2010   On lipitor- taking it every day Is eating high fat foods/ chips/ ice cream- is hard to control    Wt is up 1 lb with bmi of 27  Hx of breast ca  mammo 10/11- had one last October , [facility] on [address]  Self exam- no lumps or changes  Had radiation- and lumpectomy No chemo   Osteopenia- dexa 2010- due for one  Mother had OP Takes ca and D  Exercises- goes to the gym  Zoster status--has not checked with ins about the vaccine Had shingles  Flu shot- will get one free Monday at hosp  colonosc 1/10 10 year f/u  Gyn - no issues or problems No abn paps   Lab Results  Component Value Date   TSH 1.30 11/14/2011   no clinical changes  No change in wt or energy level or wt change or hair/ skin changes  Patient Active Problem List  Diagnosis  . HYPOTHYROIDISM  . HYPERLIPIDEMIA  . DEPRESSION  . DRY MOUTH  . GERD  . UTI'S, RECURRENT  . ADHESIVE CAPSULITIS  . OSTEOPENIA  . FATIGUE  . WEIGHT GAIN  . NEOPLASM, MALIGNANT, BREAST, HX OF  .  Insomnia  . Routine general medical examination at a health care facility  . Other screening mammogram   Past Medical History  Diagnosis Date  . Cancer 2000    breast had lumpectomy/radiation x36,mammo ,no chemo  . Depression   . GERD (gastroesophageal reflux disease)   . Hyperlipidemia   . Hypothyroid   . Frozen shoulder   . Osteopenia     BMD 2004, WNL 2008  . Shingles     chronic body pain, left side of body  . Dysuria-frequency syndrome     takes AZO  . Zoster     Hx of   Past Surgical History  Procedure Date  . Breast surgery 2000    lumpectomy   History  Substance Use Topics  . Smoking status: Never Smoker   . Smokeless tobacco: Not on file  . Alcohol Use: No   Family History  Problem Relation Age of Onset  . Osteoporosis Mother   . Parkinsonism Father   . Cancer Sister     breast CA  . Cancer Other     breast CA   Allergies  Allergen Reactions  . Citalopram Hydrobromide  Intrusive/ weird thoughts    Current Outpatient Prescriptions on File Prior to Visit  Medication Sig Dispense Refill  . atorvastatin (LIPITOR) 20 MG tablet Take 1 tablet (20 mg total) by mouth daily.  30 tablet  11  . Calcium 1500 MG tablet Take 1,500 mg by mouth.       . Cholecalciferol (VITAMIN D3) 2000 UNITS capsule Take 2,000 Units by mouth daily.        [name] FLUoxetine (PROZAC) 20 MG tablet Take 20 mg by mouth daily.      [name] levothyroxine (SYNTHROID, LEVOTHROID) 75 MCG tablet Take 1 tablet (75 mcg total) by mouth daily.  30 tablet  3  . meclizine (ANTIVERT) 25 MG tablet One pill by mouth at bedtime as needed for dizziness  30 tablet  0  . Multiple Vitamin (MULTIVITAMIN) tablet Take 1 tablet by mouth daily.        [name] OMEPRAZOLE PO Take 1 tablet by mouth daily.        . Omega-3 Fatty Acids (FISH OIL PO) Take 1 capsule by mouth daily.        [name] DISCONTD: Diphenhydramine-APAP, sleep, (TYLENOL PM EXTRA STRENGTH PO) Take by mouth as needed.            Review of Systems Review of  Systems  Constitutional: Negative for fever, appetite change, fatigue and unexpected weight change.  Eyes: Negative for pain and visual disturbance.  Respiratory: Negative for cough and shortness of breath.   Cardiovascular: Negative for cp or palpitations    Gastrointestinal: Negative for nausea, diarrhea and constipation.  Genitourinary: Negative for urgency and frequency.  Skin: Negative for pallor or rash   Neurological: Negative for weakness, light-headedness, numbness and headaches.  Hematological: Negative for adenopathy. Does not bruise/bleed easily.  Psychiatric/Behavioral: Negative for dysphoric mood. The patient is not nervous/anxious.         Objective:   Physical Exam  Constitutional: She appears well-developed and well-nourished. No distress.  HENT:  Head: Normocephalic and atraumatic.  Right Ear: External ear normal.  Left Ear: External ear normal.  Nose: Nose normal.  Mouth/Throat: Oropharynx is clear and moist. No oropharyngeal exudate.  Eyes: Conjunctivae normal and EOM are normal. Pupils are equal, round, and reactive to light. No scleral icterus.  Neck: Normal range of motion. Neck supple. No JVD present. Carotid bruit is not present. No thyromegaly present.  Cardiovascular: Normal rate, regular rhythm, normal heart sounds and intact distal pulses.  Exam reveals no gallop.   Pulmonary/Chest: Effort normal and breath sounds normal. No respiratory distress. She has no wheezes.  Abdominal: Soft. Bowel sounds are normal. She exhibits no distension, no abdominal bruit and no mass. There is no tenderness.  Genitourinary: No breast swelling, tenderness, discharge or bleeding.       Breast exam: No mass, nodules, thickening, tenderness, bulging, retraction, inflamation, nipple discharge or skin changes noted. Baseline lumpectomy scar noted  No axillary or clavicular LA.  Chaperoned exam.    Musculoskeletal: She exhibits no edema and no tenderness.  Lymphadenopathy:    She  has no cervical adenopathy.  Neurological: She is alert. She has normal reflexes. No cranial nerve deficit. She exhibits normal muscle tone. Coordination normal.  Skin: Skin is warm and dry. No rash noted. No erythema. No pallor.  Psychiatric: She has a normal mood and affect.          Assessment & Plan:

## 2011-11-23 NOTE — Assessment & Plan Note (Signed)
Scheduled annual screening mammogram Nl breast exam today  Encouraged monthly self exams  Breast cancer was over 10 y ago

## 2011-11-23 NOTE — Assessment & Plan Note (Signed)
Hypothyroidism  Pt has no clinical changes No change in energy level/ hair or skin/ edema and no tremor Lab Results  Component Value Date   TSH 1.30 11/14/2011

## 2011-11-26 ENCOUNTER — Other Ambulatory Visit: Payer: Self-pay

## 2011-11-26 MED ORDER — LEVOTHYROXINE SODIUM 75 MCG PO TABS: 75.0000 ug | ORAL_TABLET | Freq: Every day | ORAL | Status: DC

## 2011-11-26 NOTE — Telephone Encounter (Signed)
That is the dose she should be on - thyroid test (tsh ) is theraputic Will refill electronically

## 2011-11-26 NOTE — Telephone Encounter (Signed)
Pt seen 11/23/11; pt said thyroid was not mentioned at office visit and pt is out of med today and before requesting refills on levothyroxine 75 mcg wanted to make sure that was the strength pt should be taking. [facility] [place].

## 2011-11-26 NOTE — Telephone Encounter (Signed)
Pt left v/m needed refill levothyroxine 75 mcg and had question about thyroid. Left v/m for pt to call back; spoke with pts husband and he thought question related to strength of thyroid med pt should be taking; pts husband said not to refill med until pt contacts our office.

## 2011-11-27 NOTE — Telephone Encounter (Signed)
Notified pt that she should be on the and Rx was called in to pharm.

## 2011-12-24 ENCOUNTER — Ambulatory Visit
Admission: RE | Admit: 2011-12-24 | Discharge: 2011-12-24 | Disposition: A | Discharge status: Home / Self Care | Payer: Medicare Other | Source: Ambulatory Visit | Attending: Family Medicine | Admitting: Family Medicine

## 2011-12-24 DIAGNOSIS — M899 Disorder of bone, unspecified: Secondary | ICD-10-CM

## 2011-12-24 DIAGNOSIS — M949 Disorder of cartilage, unspecified: Secondary | ICD-10-CM

## 2011-12-24 DIAGNOSIS — Z1231 Encounter for screening mammogram for malignant neoplasm of breast: Secondary | ICD-10-CM

## 2011-12-27 ENCOUNTER — Encounter: Payer: Self-pay | Admitting: *Deleted

## 2011-12-31 ENCOUNTER — Encounter: Payer: Self-pay | Admitting: Family Medicine

## 2012-01-01 ENCOUNTER — Telehealth: Payer: Self-pay | Admitting: Family Medicine

## 2012-01-01 DIAGNOSIS — R9389 Abnormal findings on diagnostic imaging of other specified body structures: Secondary | ICD-10-CM

## 2012-01-01 NOTE — Telephone Encounter (Signed)
I do not see anything in the chart , so please send for records/ studies or whatever I'm supposed to have

## 2012-01-01 NOTE — Telephone Encounter (Signed)
[name]: Dnyla/Patient; Patient Name: Frances Hoffman; PCP: Roxy Manns [facility]); Best Callback Phone Number: [phone].  Patient calling about chipped tooth; when she went to the dentist, they found something on her oral xrays; she then had [place] [facility] consult with Dr. Wendall Mola [phone], and was told had "corrosiion" or "clogged" in veins or arteries in the neck, and probably was not an oral surgery issue.  Was told Dr. Milinda Antis would receive his notes and might make a referral after that.  Has not heard from office regarding this; info to office for staff followup.

## 2012-01-02 NOTE — Telephone Encounter (Signed)
I will review those when they get here

## 2012-01-02 NOTE — Telephone Encounter (Signed)
Spoke to [employer] and they said that the letter he is sending [place] is being dictated and once its done they will fax it to [place]

## 2012-01-04 NOTE — Telephone Encounter (Signed)
[name]: Shaletta/Patient; Patient Name: Frances Hoffman; PCP: [name].; Best Callback Phone Number: [phone]; Was calling to follow up on phone call from 01/01/12 regarding oral surgeon Dr Wendall Mola concern that she had "clogged arteries" in her neck.  Patient's chart reflected that Dr. Milinda Antis is waiting  for his note to be sent for her to review.  Shared information with patient that they were aware and working on concerns as reflected in notes.  Patient demonstrated understanding.

## 2012-01-07 NOTE — Telephone Encounter (Signed)
I looked through my IN box again, and do not see the note/ info-- please call for it, thanks

## 2012-01-07 NOTE — Telephone Encounter (Signed)
Ok- please let pt know I will review them when they arrive

## 2012-01-07 NOTE — Telephone Encounter (Signed)
Called Dr. Melvenia Needles office and letter with images was mailed to you today, they mailed it because they couldn't faxed over the images

## 2012-01-11 DIAGNOSIS — R9389 Abnormal findings on diagnostic imaging of other specified body structures: Secondary | ICD-10-CM | POA: Insufficient documentation

## 2012-01-11 NOTE — Telephone Encounter (Signed)
It looks like they saw a bit of calcification in her carotid artery area on the left - this could represent some plaque in her artery - not particularly worrisome but I would like to get a carotid ultrasound (doppler) to make sure all is ok I am doing the referral and would like her to f/u about 2 weeks after the test to discuss results

## 2012-01-11 NOTE — Telephone Encounter (Addendum)
Pt left v/m wanting Dr Royden Purl opinion after reviewing oral surgeon's office notes.Please advise.pt request call back.

## 2012-01-11 NOTE — Addendum Note (Signed)
Addended by: Roxy Manns A on: 01/11/2012 04:27 PM   Modules accepted: Orders

## 2012-01-11 NOTE — Telephone Encounter (Signed)
Notified pt of results and that Frances Hoffman will call to set up [place], pt notified me that they are going out of town next week but will try to call office, but if they don't call they will be back home on 01/18/12 and we can call then to set up appt then

## 2012-01-16 ENCOUNTER — Other Ambulatory Visit: Payer: Medicare Other

## 2012-01-17 NOTE — Telephone Encounter (Signed)
Carotid US set up at [facility] patient has been notified.

## 2012-01-21 ENCOUNTER — Other Ambulatory Visit (INDEPENDENT_AMBULATORY_CARE_PROVIDER_SITE_OTHER): Payer: Medicare Other

## 2012-01-21 DIAGNOSIS — E785 Hyperlipidemia, unspecified: Secondary | ICD-10-CM

## 2012-01-21 LAB — LIPID PANEL: Total CHOL/HDL Ratio: 4

## 2012-01-24 ENCOUNTER — Encounter (INDEPENDENT_AMBULATORY_CARE_PROVIDER_SITE_OTHER): Payer: Medicare Other

## 2012-01-24 ENCOUNTER — Encounter: Payer: Self-pay | Admitting: *Deleted

## 2012-01-24 DIAGNOSIS — R9389 Abnormal findings on diagnostic imaging of other specified body structures: Secondary | ICD-10-CM

## 2012-01-24 DIAGNOSIS — I6529 Occlusion and stenosis of unspecified carotid artery: Secondary | ICD-10-CM

## 2012-02-11 ENCOUNTER — Telehealth: Payer: Self-pay | Admitting: Family Medicine

## 2012-02-11 NOTE — Telephone Encounter (Signed)
Patient Information:  [name] Name: Catheleen  Phone: [phone]  Patient: Frances Hoffman, Frances Hoffman  Gender: Female  DOB: 1940/05/14  Age: 71 Years  PCP: Roxy Manns [facility])   Symptoms  Reason For Call & Symptoms: Patient is concerned about a scaley white area on top of her left foot. Onset 4-5 months. Size of an eraser. slightly raised.  No itching. no pain. Patient is unsure if she needs to see Dermatology or PCP. Per [employer],  advised that the office does like to evaluate dermatololgy needs in the office prior to referring to Dermatology.  Reviewed Health History In EMR: Yes  Reviewed Medications In EMR: Yes  Reviewed Allergies In EMR: Yes  Reviewed Surgeries / Procedures: No  Date of Onset of Symptoms: 11/04/2011  Treatments Tried: She has tried to scrap it off and won't come off  Treatments Tried Worked: No  Guideline(s) Used:  Skin Lesion - Moles or Growths  Disposition Per Guideline:   See Within 2 Weeks in Office  Reason For Disposition Reached:   Sticks up out of the skin (elevated), and feels rough to the touch  Advice Given:  Call Back If:  Fever or pain occurs  Any change in the mole or growth  You become worse.  Office Follow Up:  Does the office need to follow up with this patient?: Yes  Instructions For The Office: Patient needs appt within the next 2 weeks for evaluation  RN Note:  Attempted to transfer to appt.line to schedule. [name] hung up . please contact to schedule appt.

## 2012-02-11 NOTE — Telephone Encounter (Signed)
Called pt an she has already set up appt with dermatologist so declined to set up appt with Dr. Milinda Antis

## 2012-02-24 ENCOUNTER — Other Ambulatory Visit: Payer: Self-pay | Admitting: Family Medicine

## 2012-02-25 NOTE — Telephone Encounter (Signed)
Ok to refill 

## 2012-02-25 NOTE — Telephone Encounter (Signed)
Can refil for a year-thanks

## 2012-03-10 ENCOUNTER — Other Ambulatory Visit: Payer: Self-pay | Admitting: Family Medicine

## 2012-06-16 ENCOUNTER — Ambulatory Visit (INDEPENDENT_AMBULATORY_CARE_PROVIDER_SITE_OTHER): Payer: Medicare Other | Admitting: Family Medicine

## 2012-06-16 ENCOUNTER — Encounter: Payer: Self-pay | Admitting: Family Medicine

## 2012-06-16 VITALS — BP 132/76 | HR 76 | Temp 97.8°F | Wt 160.5 lb

## 2012-06-16 DIAGNOSIS — R300 Dysuria: Secondary | ICD-10-CM

## 2012-06-16 DIAGNOSIS — N390 Urinary tract infection, site not specified: Secondary | ICD-10-CM

## 2012-06-16 LAB — POCT URINALYSIS DIPSTICK
Bilirubin, UA: NEGATIVE
Glucose, UA: NEGATIVE
Nitrite, UA: NEGATIVE
Urobilinogen, UA: 0.2
pH, UA: 6.5

## 2012-06-16 MED ORDER — CIPROFLOXACIN HCL 250 MG PO TABS: 250.0000 mg | ORAL_TABLET | Freq: Two times a day (BID) | ORAL | Status: DC

## 2012-06-16 NOTE — Assessment & Plan Note (Signed)
Given hx recurrence, sent urine culture Treat with cipro 250mg  bid x 5 days. Pt agrees with plan.

## 2012-06-16 NOTE — Progress Notes (Signed)
Subjective:    Patient ID: Frances Hoffman, female    DOB: 10-Apr-1940, 72 y.o.   MRN: [id number]  HPI CC: UTI?  3d h/o dysuria, lower abd pain, urgency and frequency. No fevers/chills, back pain, hematuria, nausea/vomiting.  H/o recurrent UTI's in past, but last one was 11/2011.  Tried some AZO as well as ibuprofen which helped some.  Past Medical History  Diagnosis Date  . Cancer 2000    breast had lumpectomy/radiation x36,mammo ,no chemo  . Depression   . GERD (gastroesophageal reflux disease)   . Hyperlipidemia   . Hypothyroid   . Frozen shoulder   . Osteopenia     BMD 2004, WNL 2008  . Shingles     chronic body pain, left side of body  . Dysuria-frequency syndrome     takes AZO  . Zoster     Hx of    Review of Systems Per HPI    Objective:   Physical Exam  Nursing note and vitals reviewed. Constitutional: She appears well-developed and well-nourished. No distress.  Abdominal: Soft. Normal appearance and bowel sounds are normal. She exhibits no distension and no mass. There is no tenderness. There is no rigidity, no rebound, no guarding, no CVA tenderness and negative Murphy's sign.  Skin: Skin is warm and dry. No rash noted.  Psychiatric: She has a normal mood and affect.       Assessment & Plan:

## 2012-06-16 NOTE — Patient Instructions (Addendum)
You have another urinary infection. Treat with cipro twice daily for 5 days. Push water and rest.  Also drink cranberry juice to help prevent recurrence. Good to see you today, call us with questions.

## 2012-06-18 LAB — URINE CULTURE

## 2012-08-12 ENCOUNTER — Encounter: Payer: Self-pay | Admitting: Family Medicine

## 2012-08-25 ENCOUNTER — Encounter: Payer: Self-pay | Admitting: Family Medicine

## 2012-08-25 ENCOUNTER — Ambulatory Visit (INDEPENDENT_AMBULATORY_CARE_PROVIDER_SITE_OTHER): Payer: Medicare Other | Admitting: Family Medicine

## 2012-08-25 VITALS — BP 102/76 | HR 86 | Temp 98.4°F | Wt 161.2 lb

## 2012-08-25 DIAGNOSIS — R509 Fever, unspecified: Secondary | ICD-10-CM

## 2012-08-25 NOTE — Patient Instructions (Addendum)
Drink plenty of fluids, take tylenol as needed, and gargle with warm salt water for your throat.  This should gradually improve.  Take care.  Let us know if you have other concerns.    

## 2012-08-25 NOTE — Progress Notes (Signed)
Fever this AM and yesterday AM.  Up to 102.  Took tylenol and fever improved.  She may have had a fever for an antecedent day or so.  She has has diffuse aches over the last few days- eyes, trunk, arms, and her L side.  The aches come and go.  No ear pain.  No tick bites known. No rhinorrhea.  No ST.  No cough.  No sputum.  No diarrhea, no vomiting.  No rash.  No vision changes. No med changes.  If she didn't have the fever and aches, she would be at baseline.    Meds, vitals, and allergies reviewed.   ROS: See HPI.  Otherwise, noncontributory.  GEN: nad, alert and oriented HEENT: mucous membranes moist, tm wnl nasal exam wnl, mild irritation on the soft palate noted, typical for a benign viral infection NECK: supple w/o LA CV: rrr PULM: ctab, no inc wob ABD: soft, +bs EXT: no edema SKIN: no acute rash

## 2012-08-26 DIAGNOSIS — R509 Fever, unspecified: Secondary | ICD-10-CM | POA: Insufficient documentation

## 2012-08-26 NOTE — Assessment & Plan Note (Signed)
With episodic diffuse aches, nontoxic, and exam typical for a viral process.  Ddx d/w pt.  No alarming signs, sx.  Supportive tx and f/u prn.

## 2012-09-08 ENCOUNTER — Other Ambulatory Visit: Payer: Self-pay | Admitting: *Deleted

## 2012-09-08 MED ORDER — ATORVASTATIN CALCIUM 20 MG PO TABS: ORAL_TABLET | ORAL | Status: DC

## 2012-09-09 ENCOUNTER — Encounter: Payer: Self-pay | Admitting: Family Medicine

## 2012-09-09 ENCOUNTER — Ambulatory Visit (INDEPENDENT_AMBULATORY_CARE_PROVIDER_SITE_OTHER): Payer: Medicare Other | Admitting: Family Medicine

## 2012-09-09 VITALS — BP 126/64 | HR 74 | Temp 98.2°F | Ht 63.5 in | Wt 159.8 lb

## 2012-09-09 DIAGNOSIS — E039 Hypothyroidism, unspecified: Secondary | ICD-10-CM

## 2012-09-09 DIAGNOSIS — R1032 Left lower quadrant pain: Secondary | ICD-10-CM

## 2012-09-09 DIAGNOSIS — R509 Fever, unspecified: Secondary | ICD-10-CM

## 2012-09-09 DIAGNOSIS — R635 Abnormal weight gain: Secondary | ICD-10-CM

## 2012-09-09 LAB — POCT URINALYSIS DIPSTICK
Glucose, UA: NEGATIVE
Ketones, UA: NEGATIVE
Spec Grav, UA: 1.015
pH, UA: 6.5

## 2012-09-09 LAB — CBC WITH DIFFERENTIAL/PLATELET
Basophils Absolute: 0 10*3/uL (ref 0.0–0.1)
Eosinophils Relative: 0.7 % (ref 0.0–5.0)
MCV: 92.5 fl (ref 78.0–100.0)
Monocytes Absolute: 1 10*3/uL (ref 0.1–1.0)
Neutrophils Relative %: 61.7 % (ref 43.0–77.0)
Platelets: 418 10*3/uL — ABNORMAL HIGH (ref 150.0–400.0)
RDW: 13.8 % (ref 11.5–14.6)
WBC: 8.2 10*3/uL (ref 4.5–10.5)

## 2012-09-09 LAB — COMPREHENSIVE METABOLIC PANEL
ALT: 29 U/L (ref 0–35)
AST: 25 U/L (ref 0–37)
Albumin: 3.8 g/dL (ref 3.5–5.2)
Alkaline Phosphatase: 149 U/L — ABNORMAL HIGH (ref 39–117)
Glucose, Bld: 86 mg/dL (ref 70–99)
Potassium: 4.7 mEq/L (ref 3.5–5.1)
Sodium: 140 mEq/L (ref 135–145)
Total Protein: 7.1 g/dL (ref 6.0–8.3)

## 2012-09-09 LAB — TSH: TSH: 1.06 u[IU]/mL (ref 0.35–5.50)

## 2012-09-09 NOTE — Assessment & Plan Note (Signed)
tsh today in light of fatigue and malaise

## 2012-09-09 NOTE — Assessment & Plan Note (Signed)
Left upper more than lower quadrant in terms of tenderness- no rebound or gaurding Some stool change and mucous as well (no hx of exp to c diff)  Lab today incl ua when she can give a sample  Diverticulitis/ colitis/ uti all in the differential

## 2012-09-09 NOTE — Progress Notes (Signed)
Subjective:    Patient ID: Frances Hoffman, female    DOB: Mar 11, 1940, 72 y.o.   MRN: [id number]  HPI Here for generalized symptoms  She does not feel well  Saw Dr Para March last week - then developed a fever   She has trouble sleeping  She "sweats like a mule" Tends to have discharge - from rectum (mucous) - and she has L sided abd pain rad to her back  Vomited once with her fever -then better - then diarrhea several days later (no blood in stool) Fevers have not continued (once was 102 then normal)  No resp symptoms No cough No urinary symptoms   Today - pain over abd and back  Sweating - generally easy to sweat -- for years  Does not think she had heat stroke No fever  No energy  Unmotivated and feels depressed at times  ? If thyroid could be out of wack Wt is stable   Last colonoscopy 2010   Patient Active Problem List   Diagnosis Date Noted  . Fever, unspecified 08/26/2012  . Abnormal x-ray of neck 01/11/2012  . Routine general medical examination at a health care facility 11/23/2011  . Other screening mammogram 11/23/2011  . Insomnia 01/16/2011  . DRY MOUTH 08/15/2009  . FATIGUE 08/15/2009  . OSTEOPENIA 11/26/2008  . NEOPLASM, MALIGNANT, BREAST, HX OF 07/01/2008  . UTI'S, RECURRENT 01/15/2008  . WEIGHT GAIN 01/15/2008  . HYPOTHYROIDISM 12/12/2007  . HYPERLIPIDEMIA 12/12/2007  . DEPRESSION 12/12/2007  . GERD 12/12/2007  . ADHESIVE CAPSULITIS 12/12/2007   Past Medical History  Diagnosis Date  . Cancer 2000    breast had lumpectomy/radiation x36,mammo ,no chemo  . Depression   . GERD (gastroesophageal reflux disease)   . Hyperlipidemia   . Hypothyroid   . Frozen shoulder   . Osteopenia     BMD 2004, WNL 2008  . Shingles     chronic body pain, left side of body  . Dysuria-frequency syndrome     takes AZO  . Zoster     Hx of   Past Surgical History  Procedure Laterality Date  . Breast surgery  2000    lumpectomy   History  Substance Use Topics  .  Smoking status: Never Smoker   . Smokeless tobacco: Not on file  . Alcohol Use: No   Family History  Problem Relation Age of Onset  . Osteoporosis Mother   . Parkinsonism Father   . Cancer Sister     breast CA  . Cancer Other     breast CA   Allergies  Allergen Reactions  . Citalopram Hydrobromide     Intrusive/ weird thoughts    Current Outpatient Prescriptions on File Prior to Visit  Medication Sig Dispense Refill  . atorvastatin (LIPITOR) 20 MG tablet TAKE ONE TABLET BY MOUTH EVERY DAY  30 tablet  0  . Calcium 1500 MG tablet Take 1,500 mg by mouth.       . Cholecalciferol (VITAMIN D3) 2000 UNITS capsule Take 2,000 Units by mouth daily.        [name] FLUoxetine (PROZAC) 20 MG capsule TAKE ONE CAPSULE BY MOUTH EVERY DAY  30 capsule  11  . levothyroxine (SYNTHROID, LEVOTHROID) 75 MCG tablet Take 1 tablet (75 mcg total) by mouth daily.  30 tablet  11  . meclizine (ANTIVERT) 25 MG tablet One pill by mouth at bedtime as needed for dizziness  30 tablet  0  . Multiple Vitamin (MULTIVITAMIN) tablet Take 1 tablet  by mouth daily.        [name] OMEPRAZOLE PO Take 1 tablet by mouth daily.        . [DISCONTINUED] Diphenhydramine-APAP, sleep, (TYLENOL PM EXTRA STRENGTH PO) Take by mouth as needed.         No current facility-administered medications on file prior to visit.    Review of Systems Review of Systems  Constitutional: Negative for fever, appetite change, and unexpected weight change. pos for fatigue and malaise Eyes: Negative for pain and visual disturbance.  Respiratory: Negative for cough and shortness of breath.   Cardiovascular: Negative for cp or palpitations    Gastrointestinal: Negative for nausea or vomiting, pos for intermittent loose stool and mucous pos for LLQ discomfort Genitourinary: Negative for urgency and frequency. (baseline 2-3 urinations at night), neg for dysuria or hematuria pos for L flank pain  Skin: Negative for pallor or rash  neg for tick bites  Neurological:  Negative for weakness, light-headedness, numbness and headaches.  Hematological: Negative for adenopathy. Does not bruise/bleed easily.  Psychiatric/Behavioral: pos for dysphoric mood. The patient is not nervous/anxious.         Objective:   Physical Exam  Constitutional: She appears well-developed and well-nourished. No distress.  HENT:  Head: Normocephalic and atraumatic.  Right Ear: External ear normal.  Left Ear: External ear normal.  Nose: Nose normal.  Mouth/Throat: Oropharynx is clear and moist.  Eyes: Conjunctivae and EOM are normal. Pupils are equal, round, and reactive to light.  Neck: Normal range of motion. Neck supple. No JVD present. No thyromegaly present.  Cardiovascular: Normal rate, regular rhythm, normal heart sounds and intact distal pulses.  Exam reveals no gallop.   No murmur heard. Pulmonary/Chest: Effort normal and breath sounds normal. No respiratory distress. She has no wheezes. She has no rales.  Abdominal: Soft. Bowel sounds are normal. She exhibits no distension, no abdominal bruit, no ascites and no mass. There is no hepatosplenomegaly. There is tenderness in the left upper quadrant. There is no rigidity, no rebound, no guarding, no CVA tenderness and negative Murphy's sign.  Musculoskeletal: Normal range of motion. She exhibits no edema and no tenderness.  Lymphadenopathy:    She has no cervical adenopathy.  Neurological: She is alert. She has normal reflexes. No cranial nerve deficit. She exhibits normal muscle tone. Coordination normal.  Skin: Skin is warm and dry. No rash noted. No erythema. No pallor.  Psychiatric: She has a normal mood and affect.          Assessment & Plan:

## 2012-09-09 NOTE — Patient Instructions (Addendum)
Labs today  Bring back urine specimen when you can  We will make a plan based on results  If new symptoms develop let me know

## 2012-09-09 NOTE — Assessment & Plan Note (Signed)
This has fesolved but pt continues to feel poorly  Unable to give urine sample today so whe will bring one back  Labs incl cbc Disc poss of diverticulitis or other GI problem

## 2012-09-23 ENCOUNTER — Ambulatory Visit (INDEPENDENT_AMBULATORY_CARE_PROVIDER_SITE_OTHER): Payer: Medicare Other | Admitting: Family Medicine

## 2012-09-23 ENCOUNTER — Encounter: Payer: Self-pay | Admitting: Family Medicine

## 2012-09-23 VITALS — BP 114/70 | HR 89 | Temp 98.2°F | Ht 63.5 in | Wt 161.5 lb

## 2012-09-23 DIAGNOSIS — N390 Urinary tract infection, site not specified: Secondary | ICD-10-CM

## 2012-09-23 DIAGNOSIS — R300 Dysuria: Secondary | ICD-10-CM

## 2012-09-23 LAB — POCT URINALYSIS DIPSTICK
Bilirubin, UA: NEGATIVE
Glucose, UA: NEGATIVE
Ketones, UA: NEGATIVE
Nitrite, UA: POSITIVE
pH, UA: 6

## 2012-09-23 LAB — POCT UA - MICROSCOPIC ONLY
Casts, Ur, LPF, POC: 0
Crystals, Ur, HPF, POC: 0

## 2012-09-23 MED ORDER — CIPROFLOXACIN HCL 250 MG PO TABS: 250.0000 mg | ORAL_TABLET | Freq: Two times a day (BID) | ORAL | Status: DC

## 2012-09-23 NOTE — Assessment & Plan Note (Signed)
Pt has had more than 4 utis in a year -will proceed with urology referral  No vaginal symptoms - but she does complain of occ rectal d/c-disc imp of wiping front to back for this  Good water intake tx this uti with 5 d of cipro and cx pending  Will plan gyn exam at next visit

## 2012-09-23 NOTE — Patient Instructions (Addendum)
Take the cipro as directed Drink lots of water  We will call you with culture result when it returns Stop up front for a urology appt referral  If you want to do your pelvic exam before your physical in November - go ahead and schedule it after your urology visit

## 2012-09-23 NOTE — Progress Notes (Signed)
Subjective:    Patient ID: Frances Hoffman, female    DOB: [birth year], 72 y.o.   MRN: [id number]  HPI Here for urinary symptoms   Was here early this mo for abd pain - and urine cx neg then She got better fora while  Has hx of recurrent uti  Today- dip is pos   Now has pain in her bladder/ suprapubic area - since Saturday  Also a little pain in her L flank today  Some burning to urinate - took azo and tylenol and aleve and that helped  Also drinking plenty of water  No fever with this - but her baseline temp is usually 97- max is 98.6   No vomiting /but a little nauseated   Has not had a pelvic exam in a while No vag d/c or pain  Does occ have mucous from rectum Not sexually active at all   Patient Active Problem List   Diagnosis Date Noted  . Abdominal pain, left lower quadrant 09/09/2012  . Fever, unspecified 08/26/2012  . Abnormal x-ray of neck 01/11/2012  . Routine general medical examination at a health care facility 11/23/2011  . Other screening mammogram 11/23/2011  . Insomnia 01/16/2011  . DRY MOUTH 08/15/2009  . FATIGUE 08/15/2009  . OSTEOPENIA 11/26/2008  . NEOPLASM, MALIGNANT, BREAST, HX OF 07/01/2008  . UTI'S, RECURRENT 01/15/2008  . WEIGHT GAIN 01/15/2008  . HYPOTHYROIDISM 12/12/2007  . HYPERLIPIDEMIA 12/12/2007  . DEPRESSION 12/12/2007  . GERD 12/12/2007  . ADHESIVE CAPSULITIS 12/12/2007   Past Medical History  Diagnosis Date  . Cancer 2000    breast had lumpectomy/radiation x36,mammo ,no chemo  . Depression   . GERD (gastroesophageal reflux disease)   . Hyperlipidemia   . Hypothyroid   . Frozen shoulder   . Osteopenia     BMD 2004, WNL 2008  . Shingles     chronic body pain, left side of body  . Dysuria-frequency syndrome     takes AZO  . Zoster     Hx of   Past Surgical History  Procedure Laterality Date  . Breast surgery  2000    lumpectomy   History  Substance Use Topics  . Smoking status: Never Smoker   . Smokeless tobacco:  Not on file  . Alcohol Use: No   Family History  Problem Relation Age of Onset  . Osteoporosis Mother   . Parkinsonism Father   . Cancer Sister     breast CA  . Cancer Other     breast CA   Allergies  Allergen Reactions  . Citalopram Hydrobromide     Intrusive/ weird thoughts    Current Outpatient Prescriptions on File Prior to Visit  Medication Sig Dispense Refill  . atorvastatin (LIPITOR) 20 MG tablet TAKE ONE TABLET BY MOUTH EVERY DAY  30 tablet  0  . Calcium 1500 MG tablet Take 1,500 mg by mouth.       . Cholecalciferol (VITAMIN D3) 2000 UNITS capsule Take 2,000 Units by mouth daily.        [name] FLUoxetine (PROZAC) 20 MG capsule TAKE ONE CAPSULE BY MOUTH EVERY DAY  30 capsule  11  . levothyroxine (SYNTHROID, LEVOTHROID) 75 MCG tablet Take 1 tablet (75 mcg total) by mouth daily.  30 tablet  11  . meclizine (ANTIVERT) 25 MG tablet One pill by mouth at bedtime as needed for dizziness  30 tablet  0  . Multiple Vitamin (MULTIVITAMIN) tablet Take 1 tablet by mouth daily.        [name]  OMEPRAZOLE PO Take 1 tablet by mouth daily.        . [DISCONTINUED] Diphenhydramine-APAP, sleep, (TYLENOL PM EXTRA STRENGTH PO) Take by mouth as needed.         No current facility-administered medications on file prior to visit.     Review of Systems Review of Systems  Constitutional: Negative for fever, appetite change, fatigue and unexpected weight change.  Eyes: Negative for pain and visual disturbance.  Respiratory: Negative for cough and shortness of breath.   Cardiovascular: Negative for cp or palpitations    Gastrointestinal: Negative for nausea, diarrhea and constipation.  Genitourinary: pos for urgency and frequency. neg for fever or blood in urine , neg for vaginal discharge  Skin: Negative for pallor or rash   Neurological: Negative for weakness, light-headedness, numbness and headaches.  Hematological: Negative for adenopathy. Does not bruise/bleed easily.  Psychiatric/Behavioral: Negative  for dysphoric mood. The patient is not nervous/anxious.         Objective:   Physical Exam  Constitutional: She appears well-developed and well-nourished. No distress.  HENT:  Head: Normocephalic and atraumatic.  Eyes: Conjunctivae and EOM are normal. Pupils are equal, round, and reactive to light.  Cardiovascular: Normal rate and regular rhythm.   Pulmonary/Chest: Effort normal and breath sounds normal.  Abdominal: Soft. Bowel sounds are normal. She exhibits no distension and no mass. There is no hepatosplenomegaly. There is tenderness in the suprapubic area. There is no rebound, no guarding and no CVA tenderness.  Neurological: She is alert.  Skin: Skin is warm and dry. No rash noted. No erythema. No pallor.  Psychiatric: She has a normal mood and affect.          Assessment & Plan:

## 2012-09-26 ENCOUNTER — Telehealth: Payer: Self-pay | Admitting: Family Medicine

## 2012-09-26 LAB — URINE CULTURE: Colony Count: 60000

## 2012-09-26 MED ORDER — SULFAMETHOXAZOLE-TMP DS 800-160 MG PO TABS: 1.0000 | ORAL_TABLET | Freq: Two times a day (BID) | ORAL | Status: DC

## 2012-09-26 NOTE — Telephone Encounter (Signed)
Pt notified of urine cx results and to stop cipro and start bactrim ds, and update [place] if no improvement, pt verbalized understanding

## 2012-09-26 NOTE — Telephone Encounter (Signed)
Let pt know that urine cx is positive but we need to change the abx because it is resistant to cipro Please call in bactrim ds Let me know if this does not help

## 2012-10-09 ENCOUNTER — Ambulatory Visit: Payer: Self-pay

## 2012-10-09 ENCOUNTER — Other Ambulatory Visit: Payer: Self-pay | Admitting: *Deleted

## 2012-10-09 MED ORDER — ATORVASTATIN CALCIUM 20 MG PO TABS: ORAL_TABLET | ORAL | Status: DC

## 2012-10-14 ENCOUNTER — Other Ambulatory Visit: Payer: Self-pay | Admitting: Urology

## 2012-10-14 DIAGNOSIS — K769 Liver disease, unspecified: Secondary | ICD-10-CM

## 2012-10-29 ENCOUNTER — Ambulatory Visit
Admission: RE | Admit: 2012-10-29 | Discharge: 2012-10-29 | Disposition: A | Discharge status: Home / Self Care | Payer: Medicare Other | Source: Ambulatory Visit | Attending: Urology | Admitting: Urology

## 2012-10-29 DIAGNOSIS — K769 Liver disease, unspecified: Secondary | ICD-10-CM

## 2012-10-29 MED ORDER — IOHEXOL 300 MG/ML  SOLN
100.0000 mL | Freq: Once | INTRAMUSCULAR | Status: AC | PRN
  Administered 2012-10-29: 100 mL via INTRAVENOUS

## 2012-12-02 ENCOUNTER — Ambulatory Visit (INDEPENDENT_AMBULATORY_CARE_PROVIDER_SITE_OTHER): Payer: Medicare Other

## 2012-12-02 ENCOUNTER — Other Ambulatory Visit: Payer: Self-pay | Admitting: *Deleted

## 2012-12-02 ENCOUNTER — Other Ambulatory Visit: Payer: Self-pay | Admitting: Family Medicine

## 2012-12-02 DIAGNOSIS — Z23 Encounter for immunization: Secondary | ICD-10-CM

## 2012-12-02 MED ORDER — LEVOTHYROXINE SODIUM 75 MCG PO TABS: 75.0000 ug | ORAL_TABLET | Freq: Every day | ORAL | Status: DC

## 2012-12-29 ENCOUNTER — Other Ambulatory Visit: Payer: Self-pay

## 2012-12-29 DIAGNOSIS — Z1231 Encounter for screening mammogram for malignant neoplasm of breast: Secondary | ICD-10-CM

## 2013-01-07 ENCOUNTER — Telehealth: Payer: Self-pay | Admitting: Family Medicine

## 2013-01-07 DIAGNOSIS — Z0000 Encounter for general adult medical examination without abnormal findings: Secondary | ICD-10-CM

## 2013-01-07 DIAGNOSIS — E039 Hypothyroidism, unspecified: Secondary | ICD-10-CM

## 2013-01-07 DIAGNOSIS — M899 Disorder of bone, unspecified: Secondary | ICD-10-CM

## 2013-01-07 DIAGNOSIS — E785 Hyperlipidemia, unspecified: Secondary | ICD-10-CM

## 2013-01-07 NOTE — Telephone Encounter (Signed)
Message copied by [name] on Wed Jan 07, 2013  8:53 PM ------      Message from: Alvina Chou      Created: Tue Jan 06, 2013 10:09 AM      Regarding: Lab orders for Thursday, 11.6.14       Patient is scheduled for CPX labs, please order future labs, Thanks , Terri       ------

## 2013-01-09 ENCOUNTER — Other Ambulatory Visit: Payer: Medicare Other

## 2013-01-16 ENCOUNTER — Encounter: Payer: Medicare Other | Admitting: Family Medicine

## 2013-01-23 ENCOUNTER — Encounter: Payer: Self-pay | Admitting: Family Medicine

## 2013-01-28 ENCOUNTER — Ambulatory Visit: Payer: Medicare Other

## 2013-02-02 ENCOUNTER — Other Ambulatory Visit: Payer: Self-pay | Admitting: Family Medicine

## 2013-02-10 ENCOUNTER — Encounter: Payer: Self-pay | Admitting: Family Medicine

## 2013-02-11 ENCOUNTER — Ambulatory Visit
Admission: RE | Admit: 2013-02-11 | Discharge: 2013-02-11 | Disposition: A | Discharge status: Home / Self Care | Payer: Medicare Other | Source: Ambulatory Visit

## 2013-02-11 DIAGNOSIS — Z1231 Encounter for screening mammogram for malignant neoplasm of breast: Secondary | ICD-10-CM

## 2013-02-12 ENCOUNTER — Other Ambulatory Visit (INDEPENDENT_AMBULATORY_CARE_PROVIDER_SITE_OTHER): Payer: Medicare Other

## 2013-02-12 ENCOUNTER — Encounter: Payer: Self-pay | Admitting: *Deleted

## 2013-02-12 DIAGNOSIS — E039 Hypothyroidism, unspecified: Secondary | ICD-10-CM

## 2013-02-12 DIAGNOSIS — E785 Hyperlipidemia, unspecified: Secondary | ICD-10-CM

## 2013-02-12 DIAGNOSIS — Z0000 Encounter for general adult medical examination without abnormal findings: Secondary | ICD-10-CM

## 2013-02-12 DIAGNOSIS — M899 Disorder of bone, unspecified: Secondary | ICD-10-CM

## 2013-02-12 LAB — COMPREHENSIVE METABOLIC PANEL
ALT: 19 U/L (ref 0–35)
Albumin: 3.9 g/dL (ref 3.5–5.2)
CO2: 28 mEq/L (ref 19–32)
Calcium: 9.1 mg/dL (ref 8.4–10.5)
Chloride: 103 mEq/L (ref 96–112)
GFR: 62.92 mL/min (ref >60.00)
Glucose, Bld: 82 mg/dL (ref 70–99)
Sodium: 138 mEq/L (ref 135–145)
Total Protein: 7.2 g/dL (ref 6.0–8.3)

## 2013-02-12 LAB — TSH: TSH: 0.49 u[IU]/mL (ref 0.35–5.50)

## 2013-02-12 LAB — CBC WITH DIFFERENTIAL/PLATELET
Basophils Absolute: 0 10*3/uL (ref 0.0–0.1)
Eosinophils Relative: 1.2 % (ref 0.0–5.0)
HCT: 41.1 % (ref 36.0–46.0)
Hemoglobin: 13.9 g/dL (ref 12.0–15.0)
Lymphocytes Relative: 24.3 % (ref 12.0–46.0)
Lymphs Abs: 1.9 10*3/uL (ref 0.7–4.0)
Monocytes Relative: 9.5 % (ref 3.0–12.0)
Platelets: 275 10*3/uL (ref 150.0–400.0)
RDW: 13.7 % (ref 11.5–14.6)
WBC: 7.7 10*3/uL (ref 4.5–10.5)

## 2013-02-12 LAB — LIPID PANEL: Cholesterol: 187 mg/dL (ref 0–200)

## 2013-02-13 LAB — VITAMIN D 25 HYDROXY (VIT D DEFICIENCY, FRACTURES): Vit D, 25-Hydroxy: 65 ng/mL (ref 30–89)

## 2013-02-18 ENCOUNTER — Encounter: Payer: Self-pay | Admitting: Family Medicine

## 2013-02-18 ENCOUNTER — Ambulatory Visit (INDEPENDENT_AMBULATORY_CARE_PROVIDER_SITE_OTHER): Payer: Medicare Other | Admitting: Family Medicine

## 2013-02-18 VITALS — BP 124/68 | HR 96 | Temp 98.4°F | Ht 63.25 in | Wt 160.8 lb

## 2013-02-18 DIAGNOSIS — Z0000 Encounter for general adult medical examination without abnormal findings: Secondary | ICD-10-CM

## 2013-02-18 DIAGNOSIS — F329 Major depressive disorder, single episode, unspecified: Secondary | ICD-10-CM

## 2013-02-18 DIAGNOSIS — E039 Hypothyroidism, unspecified: Secondary | ICD-10-CM

## 2013-02-18 DIAGNOSIS — E785 Hyperlipidemia, unspecified: Secondary | ICD-10-CM

## 2013-02-18 DIAGNOSIS — F3289 Other specified depressive episodes: Secondary | ICD-10-CM

## 2013-02-18 DIAGNOSIS — M899 Disorder of bone, unspecified: Secondary | ICD-10-CM

## 2013-02-18 MED ORDER — ZOSTER VACCINE LIVE 19400 UNT/0.65ML ~~LOC~~ SOLR: 0.6500 mL | Freq: Once | SUBCUTANEOUS | Status: DC

## 2013-02-18 MED ORDER — LEVOTHYROXINE SODIUM 75 MCG PO TABS: 75.0000 ug | ORAL_TABLET | Freq: Every day | ORAL | Status: DC

## 2013-02-18 MED ORDER — ESCITALOPRAM OXALATE 20 MG PO TABS: 20.0000 mg | ORAL_TABLET | Freq: Every day | ORAL | Status: DC
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
## 2013-02-18 NOTE — Assessment & Plan Note (Signed)
Reviewed health habits including diet and exercise and skin cancer prevention Reviewed appropriate screening tests for age  Also reviewed health mt list, fam hx and immunization status , as well as social and family history    See HPI Labs reviewed  Zoster vaccine px given today -she will get it at a pharmacy

## 2013-02-18 NOTE — Assessment & Plan Note (Signed)
Disc goals for lipids and reasons to control them Rev labs with pt Rev low sat fat diet in detail On lipitor- but if covered she would like to change to zocor - she will check on coverage and let us know

## 2013-02-18 NOTE — Assessment & Plan Note (Signed)
Hypothyroidism  Pt has no clinical changes No change in energy level/ hair or skin/ edema and no tremor Lab Results  Component Value Date   TSH 0.49 02/12/2013

## 2013-02-18 NOTE — Assessment & Plan Note (Signed)
Worse lately with stressors Reviewed stressors/ coping techniques/symptoms/ support sources/ tx options and side effects in detail today  Will change to lexapro from prozac (she has tolerated this well in the past) If worse or SI or no imp will call and f/u  Disc need for exercise Declines counseling at this time

## 2013-02-18 NOTE — Progress Notes (Signed)
Pre-visit discussion using our clinic review tool. No additional management support is needed unless otherwise documented below in the visit note.  

## 2013-02-18 NOTE — Patient Instructions (Signed)
Here is a shingles vaccine px to take to the pharmacy  Call insurance co about coverage of crestor- call us if you want to switch  For depression change to lexapro  If any problems or side effects let me know , stay as active as you can and let me know if you want to see a counselor

## 2013-02-18 NOTE — Assessment & Plan Note (Signed)
dexa was 10/13 and stable  No fx Disc need for  vitamin D/ wt bearing exercise and bone density test every 2 y to monitor Disc safety/ fracture risk in detail   She cannot take ca due to GI upset at this time   She is on PPI long term

## 2013-03-02 ENCOUNTER — Other Ambulatory Visit: Payer: Self-pay | Admitting: Family Medicine

## 2013-08-17 ENCOUNTER — Telehealth: Payer: Self-pay | Admitting: Family Medicine

## 2013-08-17 NOTE — Telephone Encounter (Signed)
Please put her in a same day appt with me tomorrow - if able  If symptoms worsen in the meantime after hours seek care in UC or ER

## 2013-08-17 NOTE — Telephone Encounter (Signed)
Patient Information:  [name] Name: [place]  Phone: [phone]  Patient: Frances Hoffman, Frances Hoffman  Gender: Female  DOB: [birth year]  Age: 73 Years  PCP: Loura Pardon [facility])  Office Follow Up:  Does the office need to follow up with this patient?: Yes  Instructions For The Office: Pt would prefer to see Dr. Glori Bickers tomorrow vs another provider today. Is that ok? Also Dr. Marliss Coots appts tomorrow are "same day" so Triage RN unable to schedule in them today. Please call pt back and if ok for appt tomorrow, please schedule appt for her. Thanks!  RN Note:  Will make appt.  Symptoms  Reason For Call & Symptoms: Dizziness at times when stands quickly or changes position quickly. Doesn't happen every time. Onset 2-3 wks ago. States these episodes are mild and brief but does state that she decided to check BP at pharmacy few days ago and it read: 92/66 followed by re-check of 103/66. Also states she has had some mild SOB recently, only when walking outside. States when she walks indoors on treadmill at same pace, there is no SOB (does confirm that it was very hot when walking outdoors). Upon further questioning, she states she has had a mild intermittent pain in back of L arm, mostly in evenings and she cannot remember how long this pain has been occuring.  No symptoms at this moment, all of these symptoms are brief and intermittent.  Reviewed Health History In EMR: Yes  Reviewed Medications In EMR: Yes  Reviewed Allergies In EMR: Yes  Reviewed Surgeries / Procedures: Yes  Date of Onset of Symptoms: 07/27/2013  Guideline(s) Used:  Dizziness  Disposition Per Guideline:   See Within 2 Weeks in Office  Reason For Disposition Reached:   Dizziness not present now, but is a chronic symptom (recurrent or ongoing AND lasting > 4 weeks)  Advice Given:  Call Back If:  You become worse.  Drink Fluids:  Drink several glasses of fruit juice, other clear fluids, or water. This will improve hydration and  blood glucose. If you have a fever or have had heat exposure, make sure the fluids are cold.  Cool Off:  If the weather is hot, apply a cold compress to the forehead or take a cool shower or bath.  Rest for 1-2 Hours:  Lie down with feet elevated for 1 hour. This will improve blood flow and increase blood flow to the brain.  Stand Up Slowly:  In the mornings, sit up for a few minutes before you stand up. That will help your blood flow make the adjustment.  Patient Will Follow Care Advice:  YES

## 2013-08-17 NOTE — Telephone Encounter (Signed)
appt scheduled for tomorrow and advise to go to [facility] if sxs worsen in the meantime. Pt verbalized understanding

## 2013-08-18 ENCOUNTER — Encounter: Payer: Self-pay | Admitting: Family Medicine

## 2013-08-18 ENCOUNTER — Ambulatory Visit (INDEPENDENT_AMBULATORY_CARE_PROVIDER_SITE_OTHER): Payer: Medicare HMO | Admitting: Family Medicine

## 2013-08-18 VITALS — BP 104/68 | HR 86 | Temp 97.9°F | Ht 63.25 in | Wt 162.5 lb

## 2013-08-18 DIAGNOSIS — M79609 Pain in unspecified limb: Secondary | ICD-10-CM

## 2013-08-18 DIAGNOSIS — R0989 Other specified symptoms and signs involving the circulatory and respiratory systems: Secondary | ICD-10-CM

## 2013-08-18 DIAGNOSIS — H8110 Benign paroxysmal vertigo, unspecified ear: Secondary | ICD-10-CM

## 2013-08-18 DIAGNOSIS — IMO0002 Reserved for concepts with insufficient information to code with codable children: Secondary | ICD-10-CM | POA: Insufficient documentation

## 2013-08-18 DIAGNOSIS — M79602 Pain in left arm: Secondary | ICD-10-CM | POA: Insufficient documentation

## 2013-08-18 DIAGNOSIS — R0609 Other forms of dyspnea: Secondary | ICD-10-CM | POA: Insufficient documentation

## 2013-08-18 DIAGNOSIS — R0600 Dyspnea, unspecified: Secondary | ICD-10-CM | POA: Insufficient documentation

## 2013-08-18 NOTE — Assessment & Plan Note (Signed)
New over the past several mo in pt who ordinarily exercises Has hyperlipidemia on lipitor  No pedal edema and resistance training does not cause it  EKG shows some stable T wave changes from 2012 and low voltage in precordial leads  Reassuring exam - but still no cause found Cardiology ref placed

## 2013-08-18 NOTE — Patient Instructions (Signed)
When you change positions (head or body) - move slowly- I suspect you have some vertigo (has to do with inner ear)- if this worsens please let me know Exam is reassuing - but given symptoms of shortness of breath and arm pain - I want you to see cardiology  Stop at check out for a referral   If symptoms worsen- call or go to the ER

## 2013-08-18 NOTE — Assessment & Plan Note (Signed)
Intermittent -not positional or rel to exertion  Also having DOE  Ref to cardiology Reassuring exam

## 2013-08-18 NOTE — Progress Notes (Signed)
Pre visit review using our clinic review tool, if applicable. No additional management support is needed unless otherwise documented below in the visit note. 

## 2013-08-18 NOTE — Progress Notes (Signed)
Subjective:    Patient ID: Frances Hoffman, female    DOB: Jan 04, 1941, 73 y.o.   MRN: [id number]  HPI Here for dizziness and sob and arm pain   L arm hurts on and off - at least a month , always in the afternoon , and not during exertion Not dependent on position or certain movement  Feels like an ache  It lasts an hour  Nothing makes it better  Does not interfere with activities   She goes to the gym 3 times per week and works out whole body-it does not hurt   SOB- started about a month ago - does notice when walking but not at the gym (not with yoga or strengthening)  Sob starts 10-15 minutes after starting to walk  Lasts through walk- but does not stop her  No cp / no arm pain or jaw pain / no clamminess nausea or sweating  No swelling in her legs No wheezing  Husband has noticed   Dizziness - seems to be positional- happens when she rolls over in bed or in yoga No ear symptom Last 2-3 min at most  No nausea Not severe No falls No ha or congestion   Patient Active Problem List   Diagnosis Date Noted  . Dyspnea on exertion 08/18/2013  . Left arm pain 08/18/2013  . Positional vertigo 08/18/2013  . Abdominal pain, left lower quadrant 09/09/2012  . Abnormal x-ray of neck 01/11/2012  . Encounter for Medicare annual wellness exam 11/23/2011  . Other screening mammogram 11/23/2011  . Insomnia 01/16/2011  . DRY MOUTH 08/15/2009  . OSTEOPENIA 11/26/2008  . NEOPLASM, MALIGNANT, BREAST, HX OF 07/01/2008  . UTI'S, RECURRENT 01/15/2008  . WEIGHT GAIN 01/15/2008  . HYPOTHYROIDISM 12/12/2007  . HYPERLIPIDEMIA 12/12/2007  . DEPRESSION 12/12/2007  . GERD 12/12/2007  . ADHESIVE CAPSULITIS 12/12/2007   Past Medical History  Diagnosis Date  . Cancer 2000    breast had lumpectomy/radiation x36,mammo ,no chemo  . Depression   . GERD (gastroesophageal reflux disease)   . Hyperlipidemia   . Hypothyroid   . Frozen shoulder   . Osteopenia     BMD 2004, WNL 2008  . Shingles      chronic body pain, left side of body  . Dysuria-frequency syndrome     takes AZO  . Zoster     Hx of   Past Surgical History  Procedure Laterality Date  . Breast surgery  2000    lumpectomy   History  Substance Use Topics  . Smoking status: Never Smoker   . Smokeless tobacco: Not on file  . Alcohol Use: No   Family History  Problem Relation Age of Onset  . Osteoporosis Mother   . Parkinsonism Father   . Cancer Sister     breast CA  . Cancer Other     breast CA   Allergies  Allergen Reactions  . Citalopram Hydrobromide     Intrusive/ weird thoughts    Current Outpatient Prescriptions on File Prior to Visit  Medication Sig Dispense Refill  . atorvastatin (LIPITOR) 20 MG tablet TAKE ONE TABLET BY MOUTH ONCE DAILY  30 tablet  5  . Cholecalciferol (VITAMIN D3) 2000 UNITS capsule Take 2,000 Units by mouth daily.        [name] escitalopram (LEXAPRO) 20 MG tablet Take 1 tablet (20 mg total) by mouth daily.  30 tablet  11  . levothyroxine (SYNTHROID, LEVOTHROID) 75 MCG tablet Take 1 tablet (75 mcg total)  by mouth daily before breakfast.  30 tablet  11  . meclizine (ANTIVERT) 25 MG tablet One pill by mouth at bedtime as needed for dizziness  30 tablet  0  . Multiple Vitamin (MULTIVITAMIN) tablet Take 1 tablet by mouth daily.        [name] OMEPRAZOLE PO Take 1 tablet by mouth daily.        [name] zoster vaccine live, PF, (ZOSTAVAX) 08657 UNT/0.65ML injection Inject 19,400 Units into the skin once.  1 vial  0  . [DISCONTINUED] Diphenhydramine-APAP, sleep, (TYLENOL PM EXTRA STRENGTH PO) Take by mouth as needed.         No current facility-administered medications on file prior to visit.    Review of Systems Review of Systems  Constitutional: Negative for fever, appetite change, fatigue and unexpected weight change.  Eyes: Negative for pain and visual disturbance.  Respiratory: Negative for cough and pos for exertional  shortness of breath.  neg for wheezing  Cardiovascular: Negative for cp  or palpitations   pos for L arm pain , neg for PND/ orthopnea/ pedal edema  Gastrointestinal: Negative for nausea, diarrhea and constipation.  Genitourinary: Negative for urgency and frequency.  Skin: Negative for pallor or rash   Neurological: Negative for weakness, numbness and headaches. pos for positional dizziness  Hematological: Negative for adenopathy. Does not bruise/bleed easily.  Psychiatric/Behavioral: Negative for dysphoric mood. The patient is not nervous/anxious.         Objective:   Physical Exam  Constitutional: She appears well-developed and well-nourished. No distress.  obese and well appearing   HENT:  Head: Normocephalic and atraumatic.  Mouth/Throat: Oropharynx is clear and moist.  Eyes: Conjunctivae and EOM are normal. Pupils are equal, round, and reactive to light. Right eye exhibits no discharge. Left eye exhibits no discharge. No scleral icterus.  No nystagmus today  Neck: Normal range of motion. Neck supple. No JVD present. Carotid bruit is not present. No thyromegaly present.  Cardiovascular: Normal rate, regular rhythm, normal heart sounds and intact distal pulses.  Exam reveals no gallop.   No murmur heard. Pulmonary/Chest: Effort normal and breath sounds normal. No respiratory distress. She has no wheezes. She has no rales. She exhibits no tenderness.  No crackles   Abdominal: Soft. Bowel sounds are normal. She exhibits no distension and no mass. There is no tenderness.  Musculoskeletal: She exhibits no edema and no tenderness.  Nl rom L arm  No tenderness  No crepitus    Lymphadenopathy:    She has no cervical adenopathy.  Neurological: She is alert. She has normal reflexes. She displays no atrophy and no tremor. No cranial nerve deficit. She exhibits normal muscle tone. She displays a negative Romberg sign. Coordination and gait normal.  No cerebellar signs   Skin: Skin is warm and dry. No rash noted. No pallor.  Psychiatric: She has a normal mood  and affect.          Assessment & Plan:   Problem List Items Addressed This Visit     Other   Dyspnea on exertion - Primary     New over the past several mo in pt who ordinarily exercises Has hyperlipidemia on lipitor  No pedal edema and resistance training does not cause it  EKG shows some stable T wave changes from 2012 and low voltage in precordial leads  Reassuring exam - but still no cause found Cardiology ref placed      Relevant Orders      EKG  12-Lead (Completed)      Ambulatory referral to Cardiology   Left arm pain     Intermittent -not positional or rel to exertion  Also having DOE  Ref to cardiology Reassuring exam    Relevant Orders      Ambulatory referral to Cardiology   Positional vertigo     This is mild and sporatic  Nl exam  Will update if worse/no imp in 2 weeks or if new symptoms  Hopefully will be self limiting

## 2013-08-18 NOTE — Assessment & Plan Note (Signed)
This is mild and sporatic  Nl exam  Will update if worse/no imp in 2 weeks or if new symptoms  Hopefully will be self limiting

## 2013-08-21 ENCOUNTER — Telehealth: Payer: Self-pay

## 2013-08-21 NOTE — Telephone Encounter (Signed)
Pt left v/m was reviewing med list and one med is not correct. Left v/m for pt to cb.

## 2013-08-27 ENCOUNTER — Encounter: Payer: Self-pay | Admitting: Cardiovascular Disease

## 2013-08-27 ENCOUNTER — Ambulatory Visit (INDEPENDENT_AMBULATORY_CARE_PROVIDER_SITE_OTHER): Payer: Medicare HMO | Admitting: Cardiovascular Disease

## 2013-08-27 ENCOUNTER — Encounter (HOSPITAL_COMMUNITY): Payer: Self-pay | Admitting: *Deleted

## 2013-08-27 VITALS — BP 95/59 | HR 75 | Ht 66.0 in | Wt 162.0 lb

## 2013-08-27 DIAGNOSIS — K219 Gastro-esophageal reflux disease without esophagitis: Secondary | ICD-10-CM

## 2013-08-27 DIAGNOSIS — Z8249 Family history of ischemic heart disease and other diseases of the circulatory system: Secondary | ICD-10-CM | POA: Insufficient documentation

## 2013-08-27 DIAGNOSIS — M79602 Pain in left arm: Secondary | ICD-10-CM

## 2013-08-27 DIAGNOSIS — E785 Hyperlipidemia, unspecified: Secondary | ICD-10-CM

## 2013-08-27 DIAGNOSIS — E039 Hypothyroidism, unspecified: Secondary | ICD-10-CM

## 2013-08-27 DIAGNOSIS — R0600 Dyspnea, unspecified: Secondary | ICD-10-CM

## 2013-08-27 DIAGNOSIS — M79609 Pain in unspecified limb: Secondary | ICD-10-CM

## 2013-08-27 DIAGNOSIS — R0602 Shortness of breath: Secondary | ICD-10-CM

## 2013-08-27 DIAGNOSIS — R0989 Other specified symptoms and signs involving the circulatory and respiratory systems: Secondary | ICD-10-CM

## 2013-08-27 DIAGNOSIS — R0609 Other forms of dyspnea: Secondary | ICD-10-CM

## 2013-08-27 HISTORY — DX: Family history of ischemic heart disease and other diseases of the circulatory system: Z82.49

## 2013-08-27 NOTE — Progress Notes (Signed)
Patient ID: Frances Hoffman, female   DOB: 1940/06/27, 73 y.o.   MRN: [id number]     PATIENT PROFILE: Frances Hoffman is a 73 y.o. female who presents for cardiology evaluation for the courtesy of Dr. Glori Bickers for evaluation of exertional shortness of breath.   HPI:  Frances Hoffman is a 73 y.o. female who has a 15 year history of hyperlipidemia, as well as a history of hypothyroidism.  She has remained fairly active.  However, over the past 6 months, she has noticed a clear deterioration in her ability to walk with development of exertional shortness of breath.  Her exercise tolerance has reduced.  She denies associated chest pressure.  She does have family history for heart disease in that a sister underwent CABG revascularization surgery.  She also has several family members on her mother's side who have heart disease.  In addition, her son suffered a myocardial infarction in his 12's.  She has a history of GERD.  She denies PND or orthopnea.  She is unaware of palpitations.  She does have remote history of breast CA and underwent lumpectomy and radiation therapy approximately 15 years ago.  Past Medical History  Diagnosis Date  . Cancer 2000    breast had lumpectomy/radiation x36,mammo ,no chemo  . Depression   . GERD (gastroesophageal reflux disease)   . Hyperlipidemia   . Hypothyroid   . Frozen shoulder   . Osteopenia     BMD 2004, WNL 2008  . Shingles     chronic body pain, left side of body  . Dysuria-frequency syndrome     takes AZO  . Zoster     Hx of    Past Surgical History  Procedure Laterality Date  . Breast surgery  2000    lumpectomy    Allergies  Allergen Reactions  . Citalopram Hydrobromide     Intrusive/ weird thoughts     Current Outpatient Prescriptions  Medication Sig Dispense Refill  . atorvastatin (LIPITOR) 20 MG tablet TAKE ONE TABLET BY MOUTH ONCE DAILY  30 tablet  5  . Cholecalciferol (VITAMIN D3) 2000 UNITS capsule Take 2,000 Units by mouth daily.         [name] escitalopram (LEXAPRO) 20 MG tablet Take 1 tablet (20 mg total) by mouth daily.  30 tablet  11  . levothyroxine (SYNTHROID, LEVOTHROID) 75 MCG tablet Take 1 tablet (75 mcg total) by mouth daily before breakfast.  30 tablet  11  . meclizine (ANTIVERT) 25 MG tablet One pill by mouth at bedtime as needed for dizziness  30 tablet  0  . Multiple Vitamin (MULTIVITAMIN) tablet Take 1 tablet by mouth daily.        [name] OMEPRAZOLE PO Take 1 tablet by mouth daily.        . [DISCONTINUED] Diphenhydramine-APAP, sleep, (TYLENOL PM EXTRA STRENGTH PO) Take by mouth as needed.         No current facility-administered medications for this visit.    Socially, she is married for 52 years.  She has 2 children, 2 grandchildren.  She is retired from working in a Hotel manager for [employer].  She completed 12 years of education.  She does not drink alcohol.  There is no history of tobacco use.  Family History  Problem Relation Age of Onset  . Osteoporosis Mother   . Parkinsonism Father   . Cancer Sister     breast CA  . Cancer Other     breast CA  .  Patient ID: Frances Hoffman, female   DOB: 1940/06/27, 73 y.o.   MRN: [id number]     PATIENT PROFILE: Frances Hoffman is a 73 y.o. female who presents for cardiology evaluation for the courtesy of Dr. Glori Bickers for evaluation of exertional shortness of breath.   HPI:  Frances Hoffman is a 73 y.o. female who has a 15 year history of hyperlipidemia, as well as a history of hypothyroidism.  She has remained fairly active.  However, over the past 6 months, she has noticed a clear deterioration in her ability to walk with development of exertional shortness of breath.  Her exercise tolerance has reduced.  She denies associated chest pressure.  She does have family history for heart disease in that a sister underwent CABG revascularization surgery.  She also has several family members on her mother's side who have heart disease.  In addition, her son suffered a myocardial infarction in his 12's.  She has a history of GERD.  She denies PND or orthopnea.  She is unaware of palpitations.  She does have remote history of breast CA and underwent lumpectomy and radiation therapy approximately 15 years ago.  Past Medical History  Diagnosis Date  . Cancer 2000    breast had lumpectomy/radiation x36,mammo ,no chemo  . Depression   . GERD (gastroesophageal reflux disease)   . Hyperlipidemia   . Hypothyroid   . Frozen shoulder   . Osteopenia     BMD 2004, WNL 2008  . Shingles     chronic body pain, left side of body  . Dysuria-frequency syndrome     takes AZO  . Zoster     Hx of    Past Surgical History  Procedure Laterality Date  . Breast surgery  2000    lumpectomy    Allergies  Allergen Reactions  . Citalopram Hydrobromide     Intrusive/ weird thoughts     Current Outpatient Prescriptions  Medication Sig Dispense Refill  . atorvastatin (LIPITOR) 20 MG tablet TAKE ONE TABLET BY MOUTH ONCE DAILY  30 tablet  5  . Cholecalciferol (VITAMIN D3) 2000 UNITS capsule Take 2,000 Units by mouth daily.         [name] escitalopram (LEXAPRO) 20 MG tablet Take 1 tablet (20 mg total) by mouth daily.  30 tablet  11  . levothyroxine (SYNTHROID, LEVOTHROID) 75 MCG tablet Take 1 tablet (75 mcg total) by mouth daily before breakfast.  30 tablet  11  . meclizine (ANTIVERT) 25 MG tablet One pill by mouth at bedtime as needed for dizziness  30 tablet  0  . Multiple Vitamin (MULTIVITAMIN) tablet Take 1 tablet by mouth daily.        [name] OMEPRAZOLE PO Take 1 tablet by mouth daily.        . [DISCONTINUED] Diphenhydramine-APAP, sleep, (TYLENOL PM EXTRA STRENGTH PO) Take by mouth as needed.         No current facility-administered medications for this visit.    Socially, she is married for 52 years.  She has 2 children, 2 grandchildren.  She is retired from working in a Hotel manager for [employer].  She completed 12 years of education.  She does not drink alcohol.  There is no history of tobacco use.  Family History  Problem Relation Age of Onset  . Osteoporosis Mother   . Parkinsonism Father   . Cancer Sister     breast CA  . Cancer Other     breast CA  .  Patient ID: Frances Hoffman, female   DOB: 1940/06/27, 73 y.o.   MRN: [id number]     PATIENT PROFILE: Frances Hoffman is a 73 y.o. female who presents for cardiology evaluation for the courtesy of Dr. Glori Bickers for evaluation of exertional shortness of breath.   HPI:  Frances Hoffman is a 73 y.o. female who has a 15 year history of hyperlipidemia, as well as a history of hypothyroidism.  She has remained fairly active.  However, over the past 6 months, she has noticed a clear deterioration in her ability to walk with development of exertional shortness of breath.  Her exercise tolerance has reduced.  She denies associated chest pressure.  She does have family history for heart disease in that a sister underwent CABG revascularization surgery.  She also has several family members on her mother's side who have heart disease.  In addition, her son suffered a myocardial infarction in his 12's.  She has a history of GERD.  She denies PND or orthopnea.  She is unaware of palpitations.  She does have remote history of breast CA and underwent lumpectomy and radiation therapy approximately 15 years ago.  Past Medical History  Diagnosis Date  . Cancer 2000    breast had lumpectomy/radiation x36,mammo ,no chemo  . Depression   . GERD (gastroesophageal reflux disease)   . Hyperlipidemia   . Hypothyroid   . Frozen shoulder   . Osteopenia     BMD 2004, WNL 2008  . Shingles     chronic body pain, left side of body  . Dysuria-frequency syndrome     takes AZO  . Zoster     Hx of    Past Surgical History  Procedure Laterality Date  . Breast surgery  2000    lumpectomy    Allergies  Allergen Reactions  . Citalopram Hydrobromide     Intrusive/ weird thoughts     Current Outpatient Prescriptions  Medication Sig Dispense Refill  . atorvastatin (LIPITOR) 20 MG tablet TAKE ONE TABLET BY MOUTH ONCE DAILY  30 tablet  5  . Cholecalciferol (VITAMIN D3) 2000 UNITS capsule Take 2,000 Units by mouth daily.         [name] escitalopram (LEXAPRO) 20 MG tablet Take 1 tablet (20 mg total) by mouth daily.  30 tablet  11  . levothyroxine (SYNTHROID, LEVOTHROID) 75 MCG tablet Take 1 tablet (75 mcg total) by mouth daily before breakfast.  30 tablet  11  . meclizine (ANTIVERT) 25 MG tablet One pill by mouth at bedtime as needed for dizziness  30 tablet  0  . Multiple Vitamin (MULTIVITAMIN) tablet Take 1 tablet by mouth daily.        [name] OMEPRAZOLE PO Take 1 tablet by mouth daily.        . [DISCONTINUED] Diphenhydramine-APAP, sleep, (TYLENOL PM EXTRA STRENGTH PO) Take by mouth as needed.         No current facility-administered medications for this visit.    Socially, she is married for 52 years.  She has 2 children, 2 grandchildren.  She is retired from working in a Hotel manager for [employer].  She completed 12 years of education.  She does not drink alcohol.  There is no history of tobacco use.  Family History  Problem Relation Age of Onset  . Osteoporosis Mother   . Parkinsonism Father   . Cancer Sister     breast CA  . Cancer Other     breast CA  .

## 2013-08-27 NOTE — Patient Instructions (Signed)
Your physician has requested that you have a lexiscan myoview. For further information please visit [email or web address]. Please follow instruction sheet, as given.  Your physician recommends that you return for lab work fasting.  Your physician has requested that you have an echocardiogram. Echocardiography is a painless test that uses sound waves to create images of your heart. It provides your doctor with information about the size and shape of your heart and how well your heart's chambers and valves are working. This procedure takes approximately one hour. There are no restrictions for this procedure.   Your physician recommends that you schedule a follow-up appointment in: 8 weeks

## 2013-08-31 NOTE — Telephone Encounter (Signed)
Since pt reviewed her med list; pt said there was not an error and nothing further needed.

## 2013-09-01 ENCOUNTER — Telehealth (HOSPITAL_COMMUNITY): Payer: Self-pay

## 2013-09-01 ENCOUNTER — Other Ambulatory Visit: Payer: Self-pay | Admitting: Family Medicine

## 2013-09-02 ENCOUNTER — Telehealth (HOSPITAL_COMMUNITY): Payer: Self-pay

## 2013-09-02 NOTE — Telephone Encounter (Signed)
Attempted to reach pt for second time. Pt voicemail states it is not set up. I will attempt at a later time.

## 2013-09-02 NOTE — Telephone Encounter (Signed)
Encounter complete. 

## 2013-09-03 ENCOUNTER — Ambulatory Visit (HOSPITAL_COMMUNITY)
Admission: RE | Admit: 2013-09-03 | Discharge: 2013-09-03 | Disposition: A | Discharge status: Home / Self Care | Payer: Medicare HMO | Source: Ambulatory Visit | Attending: Internal Medicine | Admitting: Internal Medicine

## 2013-09-03 DIAGNOSIS — R0602 Shortness of breath: Secondary | ICD-10-CM | POA: Insufficient documentation

## 2013-09-03 DIAGNOSIS — R0989 Other specified symptoms and signs involving the circulatory and respiratory systems: Principal | ICD-10-CM | POA: Insufficient documentation

## 2013-09-03 DIAGNOSIS — M79609 Pain in unspecified limb: Secondary | ICD-10-CM | POA: Insufficient documentation

## 2013-09-03 DIAGNOSIS — R0609 Other forms of dyspnea: Secondary | ICD-10-CM | POA: Insufficient documentation

## 2013-09-03 DIAGNOSIS — I519 Heart disease, unspecified: Secondary | ICD-10-CM

## 2013-09-03 DIAGNOSIS — R5383 Other fatigue: Secondary | ICD-10-CM

## 2013-09-03 DIAGNOSIS — R5381 Other malaise: Secondary | ICD-10-CM | POA: Insufficient documentation

## 2013-09-03 DIAGNOSIS — M79602 Pain in left arm: Secondary | ICD-10-CM

## 2013-09-03 DIAGNOSIS — R0600 Dyspnea, unspecified: Secondary | ICD-10-CM

## 2013-09-03 DIAGNOSIS — Z8249 Family history of ischemic heart disease and other diseases of the circulatory system: Secondary | ICD-10-CM | POA: Insufficient documentation

## 2013-09-03 MED ORDER — AMINOPHYLLINE 25 MG/ML IV SOLN
75.0000 mg | Freq: Once | INTRAVENOUS | Status: AC
  Administered 2013-09-03: 75 mg via INTRAVENOUS

## 2013-09-03 MED ORDER — REGADENOSON 0.4 MG/5ML IV SOLN
0.4000 mg | Freq: Once | INTRAVENOUS | Status: AC
  Administered 2013-09-03: 0.4 mg via INTRAVENOUS

## 2013-09-03 MED ORDER — TECHNETIUM TC 99M SESTAMIBI GENERIC - CARDIOLITE
10.4000 | Freq: Once | INTRAVENOUS | Status: AC | PRN
  Administered 2013-09-03: 10 via INTRAVENOUS

## 2013-09-03 MED ORDER — TECHNETIUM TC 99M SESTAMIBI GENERIC - CARDIOLITE
29.9000 | Freq: Once | INTRAVENOUS | Status: AC | PRN
  Administered 2013-09-03: 29.9 via INTRAVENOUS

## 2013-09-03 NOTE — Procedures (Addendum)
[facility] [address] Frances Hoffman [id number] [phone]  Cardiology Nuclear Med Study  Frances Hoffman is a 73 y.o. female     MRN : [id number]     DOB: 1940-08-27  Procedure Date: 09/03/2013  Nuclear Med Background Indication for Stress Test:  Evaluation for Ischemia and Abnormal EKG History:  No prior cardiac or respiratory history reported. No prior NUC MPI for comparison. Cardiac Risk Factors: Family History - CAD and Lipids  Symptoms:  DOE, Fatigue and Reduced exercise tolerance;Left arm pain.   Nuclear Pre-Procedure Caffeine/Decaff Intake:  9:00pm NPO After: 9:00pm   IV Site: R Hand  IV 0.9% NS with Angio Cath:  22g  Chest Size (in):  n/a IV Started by: Rolene Course, RN  Height: 5\' 6"  (1.676 m)  Cup Size: C;Pt is s/p left lumpectomy with lymphectomy same side.  BMI:  Body mass index is 26.16 kg/(m^2). Weight:  162 lb (73.483 kg)   Tech Comments:  No sticks left arm.    Nuclear Med Study 1 or 2 day study: 1 day  Stress Test Type:  [place]  Order Authorizing Provider:  Shelva Majestic, MD   Resting Radionuclide: Technetium 36m Sestamibi  Resting Radionuclide Dose: 10.4 mCi   Stress Radionuclide:  Technetium 106m Sestamibi  Stress Radionuclide Dose: 29.9 mCi           Stress Protocol Rest HR: 66 Stress HR: 103  Rest BP: 124/48 Stress BP:127/80  Exercise Time (min): n/a METS: n/a          Dose of Adenosine (mg):  n/a Dose of Lexiscan: 0.4 mg  Dose of Atropine (mg): n/a Dose of Dobutamine: n/a mcg/kg/min (at max HR)  Stress Test Technologist: Mellody Memos, CCT Nuclear Technologist: Imagene Riches, CNMT   Rest Procedure:  Myocardial perfusion imaging was performed at rest 45 minutes following the intravenous administration of Technetium 66m Sestamibi. Stress Procedure:  The patient received IV Lexiscan 0.4 mg over 15-seconds.  Technetium 35m Sestamibi injected Iv at 30-seconds.  Patient experienced chest  tightness, throat tightness, head pain and was administered 75 mg Aminophylline IV at 5 minutes. There were no significant changes with Lexiscan.  Quantitative spect images were obtained after a 45 minute delay.  Transient Ischemic Dilatation (Normal <1.22):  1.12  QGS EDV:  54 ml QGS ESV:  13 ml LV Ejection Fraction: 76%  Rest ECG: NSR - Normal EKG  Stress ECG: No significant change from baseline ECG  QPS Raw Data Images:  Normal; no motion artifact; normal heart/lung ratio. Stress Images:  Normal homogeneous uptake in all areas of the myocardium. Rest Images:  Normal homogeneous uptake in all areas of the myocardium. Subtraction (SDS):  No evidence of ischemia.  Impression Exercise Capacity:  Lexiscan with no exercise. BP Response:  Normal blood pressure response. Clinical Symptoms:  No significant symptoms noted. ECG Impression:  No significant ST segment change suggestive of ischemia. Comparison with Prior Nuclear Study: No previous nuclear study performed  Overall Impression:  Normal stress nuclear study.  LV Wall Motion:  NL LV Function; NL Wall Motion; EF 76%.  Pixie Casino, MD, [facility] Board Certified in Nuclear Cardiology Attending Cardiologist Coy, MD  09/03/2013 4:37 PM

## 2013-09-03 NOTE — Progress Notes (Signed)
2D Echo Performed 09/03/2013    Marygrace Drought, RCS

## 2013-09-04 LAB — CBC
HEMATOCRIT: 42.3 % (ref 36.0–46.0)
Hemoglobin: 14.3 g/dL (ref 12.0–15.0)
MCH: 30.1 pg (ref 26.0–34.0)
MCHC: 33.8 g/dL (ref 30.0–36.0)
MCV: 89.1 fL (ref 78.0–100.0)
Platelets: 252 10*3/uL (ref 150–400)
RBC: 4.75 MIL/uL (ref 3.87–5.11)
RDW: 13.7 % (ref 11.5–15.5)
WBC: 8 10*3/uL (ref 4.0–10.5)

## 2013-09-04 LAB — COMPREHENSIVE METABOLIC PANEL
ALT: 19 U/L (ref 0–35)
AST: 23 U/L (ref 0–37)
Albumin: 4.1 g/dL (ref 3.5–5.2)
Alkaline Phosphatase: 108 U/L (ref 39–117)
BUN: 12 mg/dL (ref 6–23)
CALCIUM: 9.7 mg/dL (ref 8.4–10.5)
CO2: 27 meq/L (ref 19–32)
CREATININE: 0.82 mg/dL (ref 0.50–1.10)
Chloride: 105 mEq/L (ref 96–112)
GLUCOSE: 86 mg/dL (ref 70–99)
Potassium: 4.5 mEq/L (ref 3.5–5.3)
Sodium: 141 mEq/L (ref 135–145)
Total Bilirubin: 0.6 mg/dL (ref 0.2–1.2)
Total Protein: 6.5 g/dL (ref 6.0–8.3)

## 2013-09-04 LAB — LIPID PANEL
CHOL/HDL RATIO: 4.4 ratio
CHOLESTEROL: 184 mg/dL (ref 0–200)
HDL: 42 mg/dL (ref >39)
LDL Cholesterol: 116 mg/dL — ABNORMAL HIGH (ref 0–99)
TRIGLYCERIDES: 128 mg/dL (ref <150)
VLDL: 26 mg/dL (ref 0–40)

## 2013-09-04 LAB — TSH: TSH: 0.248 u[IU]/mL — AB (ref 0.350–4.500)

## 2013-09-08 ENCOUNTER — Encounter: Payer: Self-pay | Admitting: *Deleted

## 2013-09-11 ENCOUNTER — Telehealth: Payer: Self-pay | Admitting: Cardiovascular Disease

## 2013-09-11 NOTE — Telephone Encounter (Signed)
Returned a call to patient informing her that Dr. Claiborne Billings is on vacation this week and has not reviewed her labs. Informed her that I looked over it and everything looked okay except her LDL and TSH. Once he reviews it and give his recommendations someone will call her. She voiced understanding and will  Wail for Dr. Claiborne Billings.

## 2013-09-11 NOTE — Telephone Encounter (Signed)
Message deferred to [place], [place] and Dr. Claiborne Billings to advise.

## 2013-09-11 NOTE — Telephone Encounter (Signed)
Pt wants her lab results from 09-03-13 please.

## 2013-09-15 NOTE — Telephone Encounter (Signed)
Labs were reviewed in results in-box. TSH is over-suppressed; decrease synthroid from 75 to 50 micrograms daily. LDL is slightly elevated but better; continue with improved diet

## 2013-09-16 ENCOUNTER — Telehealth: Payer: Self-pay | Admitting: *Deleted

## 2013-09-16 MED ORDER — LEVOTHYROXINE SODIUM 50 MCG PO CAPS: 50.0000 ug | ORAL_CAPSULE | Freq: Every day | ORAL | Status: DC

## 2013-09-16 MED ORDER — LEVOTHYROXINE SODIUM 50 MCG PO TABS: 50.0000 ug | ORAL_TABLET | Freq: Every day | ORAL | Status: DC

## 2013-09-16 NOTE — Telephone Encounter (Signed)
New Rx sent to pharmacy for Levothyroxin.

## 2013-09-16 NOTE — Addendum Note (Signed)
Addended by: Fidel Levy on: 09/16/2013 12:15 PM   Modules accepted: Orders

## 2013-09-16 NOTE — Telephone Encounter (Signed)
Message copied by Tressa Busman on Wed Sep 16, 2013 12:41 PM ------      Message from: Shelva Majestic A      Created: Tue Sep 15, 2013  8:45 AM       Labs good x LDL slightly elevated and slightly improved. TSH is over-suppressed; decrease synthroid from 75 to 50 micrograms daily. ------

## 2013-09-16 NOTE — Telephone Encounter (Signed)
Levothyroxine dose reduced and med ordered per Dr. Claiborne Billings. Patient aware.

## 2013-09-16 NOTE — Telephone Encounter (Signed)
Lab results called to patient.  Instructions to decrease Levothyroxin to 73mcg daily. Patient voiced understanding and new Rx sent to pharmacy.

## 2013-09-22 ENCOUNTER — Telehealth: Payer: Self-pay | Admitting: *Deleted

## 2013-09-22 NOTE — Telephone Encounter (Signed)
Prior Auth received for "Tirosint" yet patient has been taking synthroid with no problems. Spoke with patient to make sure no reaction had come up and patient was unaware of the change. Called the pharmacy and when the 50 mg was sent in rather than the 75 mg it was sent in as capsule not tablet. Directed pharmacist to continue filling Synthroid tablets but 50 mg not 75 mg as her dose was lowered per [facility]

## 2013-12-02 ENCOUNTER — Ambulatory Visit: Payer: Medicare HMO | Admitting: Cardiovascular Disease

## 2013-12-11 ENCOUNTER — Ambulatory Visit: Payer: Medicare HMO

## 2013-12-31 ENCOUNTER — Other Ambulatory Visit: Payer: Self-pay | Admitting: Family Medicine

## 2014-01-25 ENCOUNTER — Other Ambulatory Visit: Payer: Self-pay

## 2014-01-25 ENCOUNTER — Other Ambulatory Visit: Payer: Self-pay | Admitting: *Deleted

## 2014-01-25 DIAGNOSIS — Z1231 Encounter for screening mammogram for malignant neoplasm of breast: Secondary | ICD-10-CM

## 2014-01-25 MED ORDER — ESCITALOPRAM OXALATE 20 MG PO TABS: 20.0000 mg | ORAL_TABLET | Freq: Every day | ORAL | Status: DC

## 2014-02-19 ENCOUNTER — Ambulatory Visit: Payer: Medicare HMO

## 2014-02-23 ENCOUNTER — Ambulatory Visit
Admission: RE | Admit: 2014-02-23 | Discharge: 2014-02-23 | Disposition: A | Discharge status: Home / Self Care | Payer: Medicare HMO | Source: Ambulatory Visit

## 2014-02-23 DIAGNOSIS — Z1231 Encounter for screening mammogram for malignant neoplasm of breast: Secondary | ICD-10-CM

## 2014-02-24 ENCOUNTER — Encounter: Payer: Self-pay | Admitting: *Deleted

## 2014-02-25 ENCOUNTER — Encounter (HOSPITAL_COMMUNITY): Payer: Self-pay | Admitting: Emergency Medicine

## 2014-02-25 ENCOUNTER — Telehealth: Payer: Self-pay | Admitting: *Deleted

## 2014-02-25 ENCOUNTER — Emergency Department (HOSPITAL_COMMUNITY): Payer: Medicare HMO

## 2014-02-25 ENCOUNTER — Emergency Department (HOSPITAL_COMMUNITY)
Admission: EM | Admit: 2014-02-25 | Discharge: 2014-02-25 | Discharge status: Against Medical Advice | Payer: Medicare HMO | Attending: Emergency Medicine | Admitting: Emergency Medicine

## 2014-02-25 DIAGNOSIS — F329 Major depressive disorder, single episode, unspecified: Secondary | ICD-10-CM | POA: Diagnosis not present

## 2014-02-25 DIAGNOSIS — E785 Hyperlipidemia, unspecified: Secondary | ICD-10-CM | POA: Insufficient documentation

## 2014-02-25 DIAGNOSIS — Z8619 Personal history of other infectious and parasitic diseases: Secondary | ICD-10-CM | POA: Insufficient documentation

## 2014-02-25 DIAGNOSIS — Z79899 Other long term (current) drug therapy: Secondary | ICD-10-CM | POA: Insufficient documentation

## 2014-02-25 DIAGNOSIS — R110 Nausea: Secondary | ICD-10-CM | POA: Insufficient documentation

## 2014-02-25 DIAGNOSIS — Z853 Personal history of malignant neoplasm of breast: Secondary | ICD-10-CM | POA: Diagnosis not present

## 2014-02-25 DIAGNOSIS — Z923 Personal history of irradiation: Secondary | ICD-10-CM | POA: Insufficient documentation

## 2014-02-25 DIAGNOSIS — E039 Hypothyroidism, unspecified: Secondary | ICD-10-CM | POA: Insufficient documentation

## 2014-02-25 DIAGNOSIS — K219 Gastro-esophageal reflux disease without esophagitis: Secondary | ICD-10-CM | POA: Diagnosis not present

## 2014-02-25 DIAGNOSIS — Z8739 Personal history of other diseases of the musculoskeletal system and connective tissue: Secondary | ICD-10-CM | POA: Diagnosis not present

## 2014-02-25 DIAGNOSIS — R079 Chest pain, unspecified: Secondary | ICD-10-CM | POA: Diagnosis present

## 2014-02-25 DIAGNOSIS — Z87448 Personal history of other diseases of urinary system: Secondary | ICD-10-CM | POA: Insufficient documentation

## 2014-02-25 LAB — BASIC METABOLIC PANEL
ANION GAP: 6 (ref 5–15)
BUN: 16 mg/dL (ref 6–23)
CO2: 28 mmol/L (ref 19–32)
Calcium: 9.6 mg/dL (ref 8.4–10.5)
Chloride: 103 mEq/L (ref 96–112)
Creatinine, Ser: 1.14 mg/dL — ABNORMAL HIGH (ref 0.50–1.10)
GFR, EST AFRICAN AMERICAN: 54 mL/min — AB (ref >90)
GFR, EST NON AFRICAN AMERICAN: 47 mL/min — AB (ref >90)
Glucose, Bld: 105 mg/dL — ABNORMAL HIGH (ref 70–99)
POTASSIUM: 4.6 mmol/L (ref 3.5–5.1)
SODIUM: 137 mmol/L (ref 135–145)

## 2014-02-25 LAB — CBC WITH DIFFERENTIAL/PLATELET
Basophils Absolute: 0 10*3/uL (ref 0.0–0.1)
Basophils Relative: 0 % (ref 0–1)
EOS ABS: 0.2 10*3/uL (ref 0.0–0.7)
Eosinophils Relative: 2 % (ref 0–5)
HCT: 40.2 % (ref 36.0–46.0)
Hemoglobin: 13.6 g/dL (ref 12.0–15.0)
LYMPHS ABS: 2 10*3/uL (ref 0.7–4.0)
Lymphocytes Relative: 28 % (ref 12–46)
MCH: 31.1 pg (ref 26.0–34.0)
MCHC: 33.8 g/dL (ref 30.0–36.0)
MCV: 92 fL (ref 78.0–100.0)
Monocytes Absolute: 1 10*3/uL (ref 0.1–1.0)
Monocytes Relative: 14 % — ABNORMAL HIGH (ref 3–12)
NEUTROS PCT: 56 % (ref 43–77)
Neutro Abs: 4 10*3/uL (ref 1.7–7.7)
PLATELETS: 225 10*3/uL (ref 150–400)
RBC: 4.37 MIL/uL (ref 3.87–5.11)
RDW: 13.2 % (ref 11.5–15.5)
WBC: 7.1 10*3/uL (ref 4.0–10.5)

## 2014-02-25 LAB — I-STAT TROPONIN, ED: TROPONIN I, POC: 0.03 ng/mL (ref 0.00–0.08)

## 2014-02-25 NOTE — ED Notes (Signed)
Pt signed discharge form by mistake, got patient to sign AMA as well.

## 2014-02-25 NOTE — ED Provider Notes (Signed)
CSN: [id number]     Arrival date & time 02/25/14  0000 History  This chart was scribed for Frances Balls, MD by Randa Evens, ED Scribe. This patient was seen in room D34C/D34C and the patient's care was started at 12:18 AM.      Chief Complaint  Patient presents with  . Chest Pain   The history is provided by the patient. No language interpreter was used.   HPI Comments: Frances Hoffman is a 73 y.o. female with PMHx of hyperlipidemia brought in by ambulance, who presents to the Emergency Department complaining of new improving intermittent aching chest pain onset 1 day prior. Pt states that the pain radiates into her back. It lasts a few minutes then resolves. There is no exertional component. Pt states the pain began while riding in the car. It is described as a dull ache. Pt states she felt some slight nausea and SOB. Pt states she took aspirin PTA that provided some relief. Pt denies abdominal pain, diarrhea, vomiting or diaphoresis. Denies Hx of HTN diabetes. Denies Hx of DVT's or CVA. Patient states she had a normal stress test less than one year ago.  Past Medical History  Diagnosis Date  . Cancer 2000    breast had lumpectomy/radiation x36,mammo ,no chemo  . Depression   . GERD (gastroesophageal reflux disease)   . Hyperlipidemia   . Hypothyroid   . Frozen shoulder   . Osteopenia     BMD 2004, WNL 2008  . Shingles     chronic body pain, left side of body  . Dysuria-frequency syndrome     takes AZO  . Zoster     Hx of  . Hypothyroid    Past Surgical History  Procedure Laterality Date  . Breast surgery  2000    lumpectomy   Family History  Problem Relation Age of Onset  . Osteoporosis Mother   . Parkinsonism Father   . Cancer Sister     breast CA  . Cancer Other     breast CA  . Heart disease Maternal Grandmother   . Heart disease Maternal Grandfather   . Coronary artery disease Sister    History  Substance Use Topics  . Smoking status: Never Smoker   .  Smokeless tobacco: Never Used  . Alcohol Use: No   OB History    No data available     Review of Systems  Respiratory: Positive for shortness of breath.   Cardiovascular: Positive for chest pain.  Gastrointestinal: Positive for nausea. Negative for vomiting, abdominal pain and diarrhea.    Allergies  Citalopram hydrobromide  Home Medications   Prior to Admission medications   Medication Sig Start Date End Date Taking? Authorizing Provider  atorvastatin (LIPITOR) 20 MG tablet TAKE ONE TABLET BY MOUTH ONCE DAILY 12/31/13   Abner Greenspan, MD  Cholecalciferol (VITAMIN D3) 2000 UNITS capsule Take 2,000 Units by mouth daily.      Historical Provider, MD  escitalopram (LEXAPRO) 20 MG tablet Take 1 tablet (20 mg total) by mouth daily. 01/25/14   Abner Greenspan, MD  Levothyroxine Sodium 50 MCG CAPS Take 1 capsule (50 mcg total) by mouth daily before breakfast. 09/16/13   Troy Sine, MD  meclizine (ANTIVERT) 25 MG tablet One pill by mouth at bedtime as needed for dizziness 02/05/11   Abner Greenspan, MD  Multiple Vitamin (MULTIVITAMIN) tablet Take 1 tablet by mouth daily.      Historical Provider, MD  OMEPRAZOLE PO  Take 1 tablet by mouth daily.      Historical Provider, MD   There were no vitals taken for this visit.   Physical Exam  Constitutional: She is oriented to person, place, and time. She appears well-developed and well-nourished. No distress.  HENT:  Head: Normocephalic and atraumatic.  Eyes: Conjunctivae and EOM are normal. Pupils are equal, round, and reactive to light. No scleral icterus.  Neck: Normal range of motion. Neck supple. No JVD present. No tracheal deviation present. No thyromegaly present.  Cardiovascular: Normal rate, regular rhythm and normal heart sounds.  Exam reveals no gallop and no friction rub.   No murmur heard. Pulmonary/Chest: Effort normal and breath sounds normal. No respiratory distress. She has no wheezes. She exhibits no tenderness.  Abdominal:  Soft. Bowel sounds are normal. She exhibits no distension and no mass. There is no tenderness. There is no rebound and no guarding.  Musculoskeletal: Normal range of motion. She exhibits no edema or tenderness.  Lymphadenopathy:    She has no cervical adenopathy.  Neurological: She is alert and oriented to person, place, and time.  Skin: Skin is warm and dry. No rash noted. No erythema. No pallor.  Psychiatric: She has a normal mood and affect. Her behavior is normal.  Nursing note and vitals reviewed.   ED Course  Procedures (including critical care time) DIAGNOSTIC STUDIES: Oxygen Saturation is 96% on RA, normal by my interpretation.    COORDINATION OF CARE: 12:42 AM-Discussed treatment plan with pt at bedside and pt agreed to plan.    Labs Review Labs Reviewed  CBC WITH DIFFERENTIAL - Abnormal; Notable for the following:    Monocytes Relative 14 (*)    All other components within normal limits  BASIC METABOLIC PANEL - Abnormal; Notable for the following:    Glucose, Bld 105 (*)    Creatinine, Ser 1.14 (*)    GFR calc non Af Amer 47 (*)    GFR calc Af Amer 54 (*)    All other components within normal limits  I-STAT TROPOININ, ED    Imaging Review Dg Chest 2 View  02/25/2014   CLINICAL DATA:  Left-sided chest pain  EXAM: CHEST  2 VIEW  COMPARISON:  04/25/2009  FINDINGS: The heart size and mediastinal contours are within normal limits. Both lungs are clear. The visualized skeletal structures are unremarkable.  IMPRESSION: No active cardiopulmonary disease.   Electronically Signed   By: Conchita Paris M.D.   On: 02/25/2014 02:03   Mm Screening Breast Tomo Bilateral  02/23/2014   CLINICAL DATA:  Screening.  EXAM: DIGITAL SCREENING BILATERAL MAMMOGRAM WITH 3D TOMO WITH CAD  COMPARISON:  Previous exam(s).  ACR Breast Density Category b: There are scattered areas of fibroglandular density.  FINDINGS: There are no findings suspicious for malignancy. Images were processed with CAD.   IMPRESSION: No mammographic evidence of malignancy. A result letter of this screening mammogram will be mailed directly to the patient.  RECOMMENDATION: Screening mammogram in one year. (Code:SM-B-01Y)  BI-RADS CATEGORY  1: Negative.   Electronically Signed   By: Enrique Sack M.D.   On: 02/23/2014 16:55     EKG Interpretation   Date/Time:  Thursday February 25 2014 00:04:35 EST Ventricular Rate:  79 PR Interval:  179 QRS Duration: 82 QT Interval:  397 QTC Calculation: 455 R Axis:   44 Text Interpretation:  Sinus rhythm Nonspecific T abnormalities, anterior  leads No significant change since last tracing Confirmed by Glynn Octave [id number]) on  02/25/2014 1:08:22 AM      MDM   Final diagnoses:  None      Patient presents emergency department for concern for chest pain. She does have a concerning story for ACS, and her heart score is at least 5. Her initial workup in the emergency department is negative, advised the patient needs to be admitted for continued evaluation. She states she does not one to stay in the hospital due to the holiday season, she is electing to leave [place]. Patient does have decision-making capacity at this time. Return precautions were strictly given and patient is amenable to them. Her vital signs currently remain within her normal limits.   I personally performed the services described in this documentation, which was scribed in my presence. The recorded information has been reviewed and is accurate.     Frances Balls, MD 02/25/14 [phone]

## 2014-02-25 NOTE — ED Notes (Addendum)
Pt arrives via ems. C/o chest pain that began about 2 hours ago while patient was sitting in her car, pain in lower rt chest, described as throbbing, some nausea associated with pain when it started, but none at this time. 324 ASA given via ems, pt denies sob, alert, oriented, NAD.

## 2014-02-25 NOTE — Telephone Encounter (Signed)
Dr. Glori Bickers sent me a message saying "Please check in with her and see how she is feeling - she left hospital against medical advice Schedule f/u" with me, thanks"  Called pt and she said she feels okay and she isn't having anymore chest pain. Hospital fu appt. scheduled for Tuesday 03/02/14

## 2014-02-25 NOTE — Discharge Instructions (Signed)
Chest Pain (Nonspecific) Frances Hoffman, you were seen today for chest pain. Your initial workup in the emergency department was negative however we recommend for you to stay for admission. You are leaving [name], please return immediately to finish her evaluation or follow-up with her primary care physician as soon as possible. If any symptoms worsen come back to the emergency department immediately. Thank you. It is often hard to give a diagnosis for the cause of chest pain. There is always a chance that your pain could be related to something serious, such as a heart attack or a blood clot in the lungs. You need to follow up with your doctor. HOME CARE  If antibiotic medicine was given, take it as directed by your doctor. Finish the medicine even if you start to feel better.  For the next few days, avoid activities that bring on chest pain. Continue physical activities as told by your doctor.  Do not use any tobacco products. This includes cigarettes, chewing tobacco, and e-cigarettes.  Avoid drinking alcohol.  Only take medicine as told by your doctor.  Follow your doctor's suggestions for more testing if your chest pain does not go away.  Keep all doctor visits you made. GET HELP IF:  Your chest pain does not go away, even after treatment.  You have a rash with blisters on your chest.  You have a fever. GET HELP RIGHT AWAY IF:   You have more pain or pain that spreads to your arm, neck, jaw, back, or belly (abdomen).  You have shortness of breath.  You cough more than usual or cough up blood.  You have very bad back or belly pain.  You feel sick to your stomach (nauseous) or throw up (vomit).  You have very bad weakness.  You pass out (faint).  You have chills. This is an emergency. Do not wait to see if the problems will go away. Call your local emergency services (911 in [place] Do not drive yourself to the hospital. MAKE SURE YOU:   Understand these  instructions.  Will watch your condition.  Will get help right away if you are not doing well or get worse. Document Released: 08/08/2007 Document Revised: 02/24/2013 Document Reviewed: 08/08/2007 [facility] Patient Information 2015 [place], [place]. This information is not intended to replace advice given to you by your health care provider. Make sure you discuss any questions you have with your health care provider.

## 2014-03-02 ENCOUNTER — Encounter: Payer: Self-pay | Admitting: Family Medicine

## 2014-03-02 ENCOUNTER — Ambulatory Visit (INDEPENDENT_AMBULATORY_CARE_PROVIDER_SITE_OTHER): Payer: Medicare HMO | Admitting: Family Medicine

## 2014-03-02 VITALS — BP 110/68 | HR 86 | Temp 98.0°F | Ht 63.25 in | Wt 167.5 lb

## 2014-03-02 DIAGNOSIS — F4323 Adjustment disorder with mixed anxiety and depressed mood: Secondary | ICD-10-CM

## 2014-03-02 DIAGNOSIS — K219 Gastro-esophageal reflux disease without esophagitis: Secondary | ICD-10-CM

## 2014-03-02 DIAGNOSIS — R0789 Other chest pain: Secondary | ICD-10-CM | POA: Insufficient documentation

## 2014-03-02 NOTE — Assessment & Plan Note (Signed)
?   If some chest pressure after last meal is gerd related Will continue PPI and eat smaller portions

## 2014-03-02 NOTE — Progress Notes (Signed)
Subjective:    Patient ID: Frances Hoffman, female    DOB: 10-29-1940, 73 y.o.   MRN: [id number]  HPI Here for f/u of ER visit on 12/24 She was brought in by EMS for CP with sob and nausea  No change EKG cxr normal  Nl labs Troponin times 1 -normal at .03 Wanted to keep her   She left AMA - because "she had a lot to do"  She felt better the next day  ? What caused it   Of note - she did have a bout of depression after thanksgiving - family issues/ old memories came back   She saw cardiology in the past - for sob and "told I was fine" - but that was a while ago  Had a stress test   Now - CP is gone , no exertional symptoms   (she does get some heaviness in her chest after last meal of the day) Not in "good shape" Some sob on exertion (mostly gone)  No nausea  No edema or PND or orthopnea   Having some discomfort after she eats  Hot flashes at night - 9 pm  Always hungry (gained wt)   Lab Results  Component Value Date   CHOL 184 09/03/2013   HDL 42 09/03/2013   LDLCALC 116* 09/03/2013   LDLDIRECT 151.8 03/15/2011   TRIG 128 09/03/2013   CHOLHDL 4.4 09/03/2013      Review of Systems Review of Systems  Constitutional: Negative for fever, appetite change, fatigue and unexpected weight change.  Eyes: Negative for pain and visual disturbance.  Respiratory: Negative for cough and wheeze  Cardiovascular: Negative for palpitations   neg for pnd or orthopnea or pedal edema  Gastrointestinal: Negative for nausea, diarrhea and constipation.  Genitourinary: Negative for urgency and frequency.  Skin: Negative for pallor or rash   Neurological: Negative for weakness, light-headedness, numbness and headaches.  Hematological: Negative for adenopathy. Does not bruise/bleed easily.  Psychiatric/Behavioral: pos for depressed and anxious mood         Objective:   Physical Exam  Constitutional: She appears well-developed and well-nourished. No distress.  overwt and well  appearing   HENT:  Head: Normocephalic and atraumatic.  Mouth/Throat: Oropharynx is clear and moist.  Eyes: Conjunctivae and EOM are normal. Pupils are equal, round, and reactive to light. No scleral icterus.  Neck: Normal range of motion. Neck supple. No JVD present. Carotid bruit is not present. No thyromegaly present.  Cardiovascular: Normal rate, regular rhythm, normal heart sounds and intact distal pulses.  Exam reveals no gallop.   No murmur heard. Pulmonary/Chest: Effort normal and breath sounds normal. No respiratory distress. She has no wheezes. She has no rales. She exhibits no tenderness.  Abdominal: Soft. Bowel sounds are normal. She exhibits no distension and no mass. There is no tenderness. There is no rebound and no guarding.  Musculoskeletal: She exhibits no edema or tenderness.  Lymphadenopathy:    She has no cervical adenopathy.  Neurological: She is alert. She has normal reflexes. No cranial nerve deficit. She exhibits normal muscle tone. Coordination normal.  Skin: Skin is warm and dry. No rash noted. No erythema. No pallor.  Psychiatric: Her speech is normal and behavior is normal. Thought content normal. Her mood appears not anxious. Her affect is not blunt, not labile and not inappropriate. Cognition and memory are normal. She exhibits a depressed mood.  Seems somewhat fatigued and depressed  Assessment & Plan:   Problem List Items Addressed This Visit      Digestive   GERD - Primary    ? If some chest pressure after last meal is gerd related Will continue PPI and eat smaller portions      Other   Adjustment disorder with mixed anxiety and depressed mood    Reviewed stressors/ coping techniques/symptoms/ support sources/ tx options and side effects in detail today I feel she would benefit from counseling- declines at this time  Rev healthy habits-would benefit from exercise once she is cleared by cardiol Is on ssri    Chest discomfort    Rev ER  records and tests in detail  She left AMA - but results were reassuring Atypical now -if any  Ref to cardiol Will go to ER if symptoms return Gerd/ depression in diff for etiol also    Relevant Orders      Ambulatory referral to Cardiology

## 2014-03-02 NOTE — Assessment & Plan Note (Signed)
Rev ER records and tests in detail  She left AMA - but results were reassuring Atypical now -if any  Ref to cardiol Will go to ER if symptoms return Gerd/ depression in diff for etiol also

## 2014-03-02 NOTE — Patient Instructions (Signed)
Stop at check out for cardiology referral  If you want to see a counselor for stress/ depression please let me know  If chest pain returns-go to the ER  Eat low fat and low sugar diet  Look weight watchers program   Exercise may help once you are cleared by cardiology

## 2014-03-02 NOTE — Assessment & Plan Note (Signed)
Reviewed stressors/ coping techniques/symptoms/ support sources/ tx options and side effects in detail today I feel she would benefit from counseling- declines at this time  Rev healthy habits-would benefit from exercise once she is cleared by cardiol Is on ssri

## 2014-03-02 NOTE — Progress Notes (Signed)
Pre visit review using our clinic review tool, if applicable. No additional management support is needed unless otherwise documented below in the visit note. 

## 2014-03-15 ENCOUNTER — Telehealth: Payer: Self-pay | Admitting: Family Medicine

## 2014-03-15 NOTE — Telephone Encounter (Signed)
That is up to her - go ahead and cancel it if she wants to

## 2014-03-15 NOTE — Telephone Encounter (Signed)
Cardiology from [facility] called to let you know that the patient declined to schedule this Cardiology consult telling them that she didn't feel like she needed it and wants to cancel this referral. Pls advise if you want [place] to cancel this referral.

## 2014-03-17 NOTE — Telephone Encounter (Signed)
error 

## 2014-03-18 NOTE — Telephone Encounter (Signed)
Referral cancelled. 

## 2014-03-31 ENCOUNTER — Telehealth: Payer: Self-pay

## 2014-03-31 NOTE — Telephone Encounter (Signed)
Pt's ins has changed and will no longer cover escitalopram; pt called ins co and citalopram is covered. Pt request substitute citalopram sent to [place].Please advise.

## 2014-03-31 NOTE — Telephone Encounter (Signed)
Citalopram is listed in her drug allergy section as causing weird/intrusive thoughts  I am hesitant to px it

## 2014-04-01 NOTE — Telephone Encounter (Signed)
Left a voicemail asking patient to callback.

## 2014-04-02 NOTE — Telephone Encounter (Signed)
Spoken to patient. She stated that since she have drug allergy to citalopram. She wants to wait to discuss with Dr. Glori Bickers on her appt on 04/21/14 on medication options.

## 2014-04-03 ENCOUNTER — Encounter (HOSPITAL_COMMUNITY): Payer: Self-pay | Admitting: Cardiology

## 2014-04-03 ENCOUNTER — Inpatient Hospital Stay (HOSPITAL_COMMUNITY)
Admission: EM | Admit: 2014-04-03 | Discharge: 2014-04-06 | DRG: 872 | Disposition: A | Discharge status: Home / Self Care | Payer: Medicare HMO | Attending: Internal Medicine | Admitting: Internal Medicine

## 2014-04-03 ENCOUNTER — Inpatient Hospital Stay (HOSPITAL_COMMUNITY): Payer: Medicare HMO

## 2014-04-03 ENCOUNTER — Emergency Department (HOSPITAL_COMMUNITY): Payer: Medicare HMO

## 2014-04-03 DIAGNOSIS — N390 Urinary tract infection, site not specified: Secondary | ICD-10-CM | POA: Diagnosis present

## 2014-04-03 DIAGNOSIS — R739 Hyperglycemia, unspecified: Secondary | ICD-10-CM | POA: Diagnosis present

## 2014-04-03 DIAGNOSIS — T8351XA Infection and inflammatory reaction due to indwelling urinary catheter, initial encounter: Secondary | ICD-10-CM

## 2014-04-03 DIAGNOSIS — A4151 Sepsis due to Escherichia coli [E. coli]: Secondary | ICD-10-CM | POA: Diagnosis present

## 2014-04-03 DIAGNOSIS — E785 Hyperlipidemia, unspecified: Secondary | ICD-10-CM | POA: Diagnosis present

## 2014-04-03 DIAGNOSIS — R05 Cough: Secondary | ICD-10-CM

## 2014-04-03 DIAGNOSIS — F329 Major depressive disorder, single episode, unspecified: Secondary | ICD-10-CM | POA: Diagnosis present

## 2014-04-03 DIAGNOSIS — M8580 Other specified disorders of bone density and structure, unspecified site: Secondary | ICD-10-CM | POA: Diagnosis present

## 2014-04-03 DIAGNOSIS — Z853 Personal history of malignant neoplasm of breast: Secondary | ICD-10-CM

## 2014-04-03 DIAGNOSIS — N179 Acute kidney failure, unspecified: Secondary | ICD-10-CM | POA: Diagnosis present

## 2014-04-03 DIAGNOSIS — R109 Unspecified abdominal pain: Secondary | ICD-10-CM

## 2014-04-03 DIAGNOSIS — R1012 Left upper quadrant pain: Secondary | ICD-10-CM

## 2014-04-03 DIAGNOSIS — Z888 Allergy status to other drugs, medicaments and biological substances status: Secondary | ICD-10-CM | POA: Diagnosis not present

## 2014-04-03 DIAGNOSIS — R740 Nonspecific elevation of levels of transaminase and lactic acid dehydrogenase [LDH]: Secondary | ICD-10-CM | POA: Diagnosis present

## 2014-04-03 DIAGNOSIS — R300 Dysuria: Secondary | ICD-10-CM | POA: Diagnosis present

## 2014-04-03 DIAGNOSIS — Z79899 Other long term (current) drug therapy: Secondary | ICD-10-CM | POA: Diagnosis not present

## 2014-04-03 DIAGNOSIS — R059 Cough, unspecified: Secondary | ICD-10-CM

## 2014-04-03 DIAGNOSIS — B029 Zoster without complications: Secondary | ICD-10-CM | POA: Diagnosis present

## 2014-04-03 DIAGNOSIS — Z881 Allergy status to other antibiotic agents status: Secondary | ICD-10-CM

## 2014-04-03 DIAGNOSIS — K219 Gastro-esophageal reflux disease without esophagitis: Secondary | ICD-10-CM

## 2014-04-03 DIAGNOSIS — R000 Tachycardia, unspecified: Secondary | ICD-10-CM | POA: Diagnosis present

## 2014-04-03 DIAGNOSIS — R197 Diarrhea, unspecified: Secondary | ICD-10-CM | POA: Diagnosis not present

## 2014-04-03 DIAGNOSIS — Z923 Personal history of irradiation: Secondary | ICD-10-CM

## 2014-04-03 DIAGNOSIS — E039 Hypothyroidism, unspecified: Secondary | ICD-10-CM | POA: Diagnosis present

## 2014-04-03 DIAGNOSIS — E872 Acidosis: Secondary | ICD-10-CM | POA: Diagnosis present

## 2014-04-03 DIAGNOSIS — Z87440 Personal history of urinary (tract) infections: Secondary | ICD-10-CM

## 2014-04-03 DIAGNOSIS — A419 Sepsis, unspecified organism: Secondary | ICD-10-CM | POA: Diagnosis present

## 2014-04-03 LAB — I-STAT CG4 LACTIC ACID, ED
Lactic Acid, Venous: 2.13 mmol/L (ref 0.5–2.0)
Lactic Acid, Venous: 0.92 mmol/L (ref 0.5–2.0)

## 2014-04-03 LAB — URINALYSIS, ROUTINE W REFLEX MICROSCOPIC
BILIRUBIN URINE: NEGATIVE
Glucose, UA: NEGATIVE mg/dL
Ketones, ur: NEGATIVE mg/dL
NITRITE: POSITIVE — AB
PROTEIN: 100 mg/dL — AB
Specific Gravity, Urine: 1.017 (ref 1.005–1.030)
UROBILINOGEN UA: 0.2 mg/dL (ref 0.0–1.0)
pH: 7 (ref 5.0–8.0)

## 2014-04-03 LAB — CBC
HCT: 38.6 % (ref 36.0–46.0)
Hemoglobin: 13.2 g/dL (ref 12.0–15.0)
MCH: 31.1 pg (ref 26.0–34.0)
MCHC: 34.2 g/dL (ref 30.0–36.0)
MCV: 91 fL (ref 78.0–100.0)
PLATELETS: 189 10*3/uL (ref 150–400)
RBC: 4.24 MIL/uL (ref 3.87–5.11)
RDW: 13.4 % (ref 11.5–15.5)
WBC: 6.2 10*3/uL (ref 4.0–10.5)

## 2014-04-03 LAB — COMPREHENSIVE METABOLIC PANEL
ALBUMIN: 3.4 g/dL — AB (ref 3.5–5.2)
ALK PHOS: 128 U/L — AB (ref 39–117)
ALT: 104 U/L — AB (ref 0–35)
AST: 136 U/L — ABNORMAL HIGH (ref 0–37)
Anion gap: 6 (ref 5–15)
BILIRUBIN TOTAL: 1.6 mg/dL — AB (ref 0.3–1.2)
BUN: 14 mg/dL (ref 6–23)
CO2: 27 mmol/L (ref 19–32)
CREATININE: 1.22 mg/dL — AB (ref 0.50–1.10)
Calcium: 9.1 mg/dL (ref 8.4–10.5)
Chloride: 104 mmol/L (ref 96–112)
GFR calc Af Amer: 50 mL/min — ABNORMAL LOW (ref >90)
GFR calc non Af Amer: 43 mL/min — ABNORMAL LOW (ref >90)
Glucose, Bld: 131 mg/dL — ABNORMAL HIGH (ref 70–99)
Potassium: 4 mmol/L (ref 3.5–5.1)
Sodium: 137 mmol/L (ref 135–145)
Total Protein: 6.5 g/dL (ref 6.0–8.3)

## 2014-04-03 LAB — URINE MICROSCOPIC-ADD ON

## 2014-04-03 LAB — MRSA PCR SCREENING: MRSA by PCR: POSITIVE — AB

## 2014-04-03 LAB — PROCALCITONIN: Procalcitonin: 14.23 ng/mL

## 2014-04-03 LAB — BRAIN NATRIURETIC PEPTIDE: B Natriuretic Peptide: 121.2 pg/mL — ABNORMAL HIGH (ref 0.0–100.0)

## 2014-04-03 MED ORDER — ENOXAPARIN SODIUM 40 MG/0.4ML ~~LOC~~ SOLN
40.0000 mg | SUBCUTANEOUS | Status: DC
  Administered 2014-04-03 – 2014-04-05 (×3): 40 mg via SUBCUTANEOUS
  Filled 2014-04-03 (×4): qty 0.4

## 2014-04-03 MED ORDER — ACETAMINOPHEN 325 MG PO TABS
650.0000 mg | ORAL_TABLET | Freq: Four times a day (QID) | ORAL | Status: DC | PRN
  Administered 2014-04-03 – 2014-04-06 (×7): 650 mg via ORAL
  Filled 2014-04-03 (×6): qty 2

## 2014-04-03 MED ORDER — VITAMIN C 500 MG PO TABS
500.0000 mg | ORAL_TABLET | Freq: Every day | ORAL | Status: DC
  Administered 2014-04-04 – 2014-04-06 (×3): 500 mg via ORAL
  Filled 2014-04-03 (×3): qty 1

## 2014-04-03 MED ORDER — ACETAMINOPHEN 650 MG RE SUPP: 650.0000 mg | Freq: Four times a day (QID) | RECTAL | Status: DC | PRN

## 2014-04-03 MED ORDER — ONDANSETRON HCL 4 MG PO TABS: 4.0000 mg | ORAL_TABLET | Freq: Four times a day (QID) | ORAL | Status: DC | PRN

## 2014-04-03 MED ORDER — ONDANSETRON HCL 4 MG/2ML IJ SOLN
4.0000 mg | Freq: Four times a day (QID) | INTRAMUSCULAR | Status: DC | PRN
  Administered 2014-04-03 – 2014-04-05 (×3): 4 mg via INTRAVENOUS
  Filled 2014-04-03 (×3): qty 2

## 2014-04-03 MED ORDER — POLYVINYL ALCOHOL 1.4 % OP SOLN
1.0000 [drp] | OPHTHALMIC | Status: DC | PRN
  Filled 2014-04-03: qty 15

## 2014-04-03 MED ORDER — CARBOXYMETHYLCELLULOSE SODIUM 1 % OP SOLN: 1.0000 [drp] | Freq: Three times a day (TID) | OPHTHALMIC | Status: DC | PRN

## 2014-04-03 MED ORDER — IOHEXOL 300 MG/ML  SOLN
25.0000 mL | INTRAMUSCULAR | Status: DC
Start: 2014-04-03 — End: 2014-04-03

## 2014-04-03 MED ORDER — DEXTROSE 5 % IV SOLN
2.0000 g | INTRAVENOUS | Status: DC
  Administered 2014-04-03 – 2014-04-05 (×3): 2 g via INTRAVENOUS
  Filled 2014-04-03 (×5): qty 2

## 2014-04-03 MED ORDER — PANTOPRAZOLE SODIUM 40 MG PO TBEC
40.0000 mg | DELAYED_RELEASE_TABLET | Freq: Every day | ORAL | Status: DC
  Administered 2014-04-04 – 2014-04-06 (×3): 40 mg via ORAL
  Filled 2014-04-03: qty 1

## 2014-04-03 MED ORDER — DEXTROSE 5 % IV SOLN
500.0000 mg | Freq: Once | INTRAVENOUS | Status: AC
  Administered 2014-04-03: 500 mg via INTRAVENOUS
  Filled 2014-04-03: qty 500

## 2014-04-03 MED ORDER — HYDROMORPHONE HCL 1 MG/ML IJ SOLN: 1.0000 mg | INTRAMUSCULAR | Status: DC | PRN

## 2014-04-03 MED ORDER — TRAMADOL HCL 50 MG PO TABS
50.0000 mg | ORAL_TABLET | Freq: Once | ORAL | Status: AC
  Administered 2014-04-03: 50 mg via ORAL
  Filled 2014-04-03: qty 1

## 2014-04-03 MED ORDER — CEFTRIAXONE SODIUM 1 G IJ SOLR: 1.0000 g | INTRAMUSCULAR | Status: DC

## 2014-04-03 MED ORDER — SODIUM CHLORIDE 0.9 % IV BOLUS (SEPSIS)
2250.0000 mL | Freq: Once | INTRAVENOUS | Status: AC
  Administered 2014-04-03: 2250 mL via INTRAVENOUS

## 2014-04-03 MED ORDER — DEXTROSE 5 % IV SOLN
1.0000 g | Freq: Once | INTRAVENOUS | Status: AC
  Administered 2014-04-03: 1 g via INTRAVENOUS
  Filled 2014-04-03: qty 10

## 2014-04-03 MED ORDER — CHLORHEXIDINE GLUCONATE CLOTH 2 % EX PADS
6.0000 | MEDICATED_PAD | Freq: Every day | CUTANEOUS | Status: DC
  Administered 2014-04-04 – 2014-04-05 (×2): 6 via TOPICAL

## 2014-04-03 MED ORDER — DEXTROSE 5 % IV SOLN
500.0000 mg | INTRAVENOUS | Status: DC
  Filled 2014-04-03 (×2): qty 500

## 2014-04-03 MED ORDER — IOHEXOL 300 MG/ML  SOLN
25.0000 mL | INTRAMUSCULAR | Status: AC
  Administered 2014-04-03 (×2): 25 mL via ORAL

## 2014-04-03 MED ORDER — ADULT MULTIVITAMIN W/MINERALS CH
1.0000 | ORAL_TABLET | Freq: Every day | ORAL | Status: DC
  Administered 2014-04-04 – 2014-04-06 (×3): 1 via ORAL
  Filled 2014-04-03 (×3): qty 1

## 2014-04-03 MED ORDER — LEVOTHYROXINE SODIUM 50 MCG PO TABS
50.0000 ug | ORAL_TABLET | Freq: Every day | ORAL | Status: DC
  Administered 2014-04-04 – 2014-04-05 (×2): 50 ug via ORAL
  Filled 2014-04-03 (×4): qty 1

## 2014-04-03 MED ORDER — ACETAMINOPHEN 500 MG PO TABS: 1000.0000 mg | ORAL_TABLET | Freq: Three times a day (TID) | ORAL | Status: DC | PRN

## 2014-04-03 MED ORDER — IOHEXOL 300 MG/ML  SOLN
80.0000 mL | Freq: Once | INTRAMUSCULAR | Status: AC | PRN
  Administered 2014-04-03: 80 mL via INTRAVENOUS

## 2014-04-03 MED ORDER — HYDROCODONE-ACETAMINOPHEN 5-325 MG PO TABS
1.0000 | ORAL_TABLET | ORAL | Status: DC | PRN
  Administered 2014-04-04: 1 via ORAL
  Filled 2014-04-03: qty 1

## 2014-04-03 MED ORDER — ALUM & MAG HYDROXIDE-SIMETH 200-200-20 MG/5ML PO SUSP: 30.0000 mL | Freq: Four times a day (QID) | ORAL | Status: DC | PRN

## 2014-04-03 MED ORDER — MUPIROCIN 2 % EX OINT
1.0000 "application " | TOPICAL_OINTMENT | Freq: Two times a day (BID) | CUTANEOUS | Status: DC
  Administered 2014-04-04 – 2014-04-06 (×4): 1 via NASAL
  Filled 2014-04-03 (×2): qty 22

## 2014-04-03 MED ORDER — SODIUM CHLORIDE 0.9 % IV SOLN
INTRAVENOUS | Status: DC
Start: 2014-04-03 — End: 2014-04-05
  Administered 2014-04-03 – 2014-04-04 (×3): via INTRAVENOUS

## 2014-04-03 MED ORDER — VITAMIN D3 25 MCG (1000 UNIT) PO TABS
2000.0000 [IU] | ORAL_TABLET | Freq: Every day | ORAL | Status: DC
  Administered 2014-04-04 – 2014-04-06 (×3): 2000 [IU] via ORAL
  Filled 2014-04-03 (×3): qty 2

## 2014-04-03 MED ORDER — OMEPRAZOLE MAGNESIUM 20 MG PO TBEC: 20.0000 mg | DELAYED_RELEASE_TABLET | Freq: Every day | ORAL | Status: DC

## 2014-04-03 NOTE — ED Notes (Signed)
I Stat Lactic Acid results shown to Dr. Rudean Haskell

## 2014-04-03 NOTE — ED Notes (Signed)
Pt reports that she has been feeling bad since last night. Reports a cough, chills and generalized weakness. Pt reports one episode of vomiting prior to arrival.

## 2014-04-03 NOTE — Progress Notes (Addendum)
ANTIBIOTIC CONSULT NOTE - INITIAL  Pharmacy Consult for Azithromycin + Rocephin Indication: rule out pneumonia/sepsis  Allergies  Allergen Reactions  . Citalopram Hydrobromide     Intrusive/ weird thoughts     Patient Measurements:     Vital Signs: Temp: 103.4 F (39.7 C) (01/30 1416) Temp Source: Rectal (01/30 1416) BP: 122/63 mmHg (01/30 1347) Pulse Rate: 128 (01/30 1347) Intake/Output from previous day:   Intake/Output from this shift:    Labs:  Recent Labs  04/03/14 1359  WBC 6.2  HGB 13.2  PLT 189   CrCl cannot be calculated (Unknown ideal weight.). No results for input(s): VANCOTROUGH, VANCOPEAK, VANCORANDOM, GENTTROUGH, GENTPEAK, GENTRANDOM, TOBRATROUGH, TOBRAPEAK, TOBRARND, AMIKACINPEAK, AMIKACINTROU, AMIKACIN in the last 72 hours.   Microbiology: No results found for this or any previous visit (from the past 720 hour(s)).  Medical History: Past Medical History  Diagnosis Date  . Cancer 2000    breast had lumpectomy/radiation x36,mammo ,no chemo  . Depression   . GERD (gastroesophageal reflux disease)   . Hyperlipidemia   . Hypothyroid   . Frozen shoulder   . Osteopenia     BMD 2004, WNL 2008  . Shingles     chronic body pain, left side of body  . Dysuria-frequency syndrome     takes AZO  . Zoster     Hx of  . Hypothyroid     Assessment: 76 YOF who presented on 1/30 with fever/chills/cough/weakness and was noted to have a high fever (103.4), tachycardia - pharmacy was consulted to start Rocephin + Azithromycin for r/o CAP and sepsis.  Both Rocephin and Azithromycin were pulled from the pyxis and handed to the RN to administer with the directions to give Rocephin first followed by Azithromycin.   Goal of Therapy:  Proper antibiotics for infection/cultures adjusted for renal/hepatic function   Plan:  1. Rocephin 1g IV every 24 hours 2. Azithromycin 500 mg IV every 24 hours 3. Pharmacy will sign off as no further dose adjustments are  expected  Alycia Rossetti, PharmD, BCPS Clinical Pharmacist Pager: [phone] 04/03/2014 3:06 PM    ADDN: Pharmacy is consulted to dose cefepime for sepsis. Pt s febrile to 101, WBC 6.2, sCr 1.22 with CrCl ~ 45 mL/min.  Plan: Cefepime 2g IV q24h  Andrey Cota. Diona Foley, PharmD Clinical Pharmacist Pager [phone]

## 2014-04-03 NOTE — ED Notes (Signed)
MD at bedside. 

## 2014-04-03 NOTE — H&P (Signed)
History and Physical       Hospital Admission Note Date: 04/03/2014  Patient name: Frances Hoffman Medical record number: [id number] Date of birth: 31-Jul-1940 Age: 74 y.o. Gender: female  PCP: Roxy Manns, MD    Chief Complaint:  Fevers with chills, generalized weakness since yesterday, mild cough  HPI: Patient is a 74 year old female with hypothyroidism, hyperlipidemia, recurrent UTIs presented to ED with fevers, chills, generalized weakness worsening since yesterday. History was obtained from the patient and her family (husband and son) in the room. Patient reported that she was in her normal baseline state of health up until yesterday evening when she started having shaking chills. She reported a mild nonproductive cough. Her husband gave her warm blankets, after dinner patient continued to complain of chills. She noted her temperature to be 71F at home. She also noticed left flank pain. This morning patient woke up with similar shaking chills and one episode of emesis. In ED, patient was noted to have lactic acidosis 2.1, creatinine 1.2, mild transaminitis. She has noticed left-sided pain in the last 2 weeks, intermittent.  Review of Systems:  Constitutional: +  fever, chills, diaphoresis, poor appetite and fatigue.  HEENT: Denies photophobia, eye pain, redness, hearing loss, ear pain, congestion, sore throat, rhinorrhea, sneezing, mouth sores, trouble swallowing, neck pain, neck stiffness and tinnitus.  + mild cough Respiratory: Denies SOB, DOE,chest tightness,  and wheezing.   Cardiovascular: Denies chest pain, palpitations and leg swelling.  Gastrointestinal:  + nausea, vomiting, abdominal pain, denies diarrhea, constipation, blood in stool and abdominal distention.  Genitourinary: Denies dysuria, urgency,hematuria, flank pain and difficulty urinating. + increased frequency Musculoskeletal: Denies myalgias, back pain, joint  swelling, arthralgias and gait problem.  Skin: Denies pallor, rash and wound.  Neurological: Denies dizziness, seizures, syncope, weakness, light-headedness, numbness and headaches.  Hematological: Denies adenopathy. Easy bruising, personal or family bleeding history  Psychiatric/Behavioral: Denies suicidal ideation, mood changes, confusion, nervousness, sleep disturbance and agitation  Past Medical History: Past Medical History  Diagnosis Date  . Cancer 2000    breast had lumpectomy/radiation x36,mammo ,no chemo  . Depression   . GERD (gastroesophageal reflux disease)   . Hyperlipidemia   . Hypothyroid   . Frozen shoulder   . Osteopenia     BMD 2004, WNL 2008  . Shingles     chronic body pain, left side of body  . Dysuria-frequency syndrome     takes AZO  . Zoster     Hx of  . Hypothyroid    Past Surgical History  Procedure Laterality Date  . Breast surgery  2000    lumpectomy    Medications: Prior to Admission medications   Medication Sig Start Date End Date Taking? Authorizing Provider  acetaminophen (TYLENOL) 500 MG tablet Take 1,000 mg by mouth every 8 (eight) hours as needed for fever (pain).   Yes Historical Provider, MD  atorvastatin (LIPITOR) 20 MG tablet TAKE ONE TABLET BY MOUTH ONCE DAILY 12/31/13  Yes [name], MD  carboxymethylcellulose 1 % ophthalmic solution Apply 1 drop to eye 3 (three) times daily as needed (for dry eyes).   Yes Historical Provider, MD  Cholecalciferol (VITAMIN D3) 2000 UNITS capsule Take 2,000 Units by mouth daily.     Yes Historical Provider, MD  Levothyroxine Sodium 50 MCG CAPS Take 1 capsule (50 mcg total) by mouth daily before breakfast. 09/16/13  Yes Lennette Bihari, MD  Multiple Vitamin (MULTIVITAMIN) tablet Take 1 tablet by mouth daily.     Yes  Historical Provider, MD  Omega-3 Fatty Acids (FISH OIL PO) Take 1 capsule by mouth daily.   Yes Historical Provider, MD  omeprazole (PRILOSEC OTC) 20 MG tablet Take 20 mg by mouth daily.    Yes Historical Provider, MD  vitamin C (ASCORBIC ACID) 500 MG tablet Take 500 mg by mouth daily.   Yes Historical Provider, MD  meclizine (ANTIVERT) 25 MG tablet One pill by mouth at bedtime as needed for dizziness 02/05/11   [name], MD    Allergies:   Allergies  Allergen Reactions  . Ciprofloxacin Other (See Comments)    Unk  . Citalopram Hydrobromide     Intrusive/ weird thoughts     Social History:  reports that she has never smoked. She has never used smokeless tobacco. She reports that she does not drink alcohol or use illicit drugs.  Family History: Family History  Problem Relation Age of Onset  . Osteoporosis Mother   . Parkinsonism Father   . Cancer Sister     breast CA  . Cancer Other     breast CA  . Heart disease Maternal Grandmother   . Heart disease Maternal Grandfather   . Coronary artery disease Sister     Physical Exam: Blood pressure 98/55, pulse 95, temperature 101 F (38.3 C), temperature source Rectal, resp. rate 22, SpO2 97 %. General: Alert, awake, oriented x3, in no acute distress. HEENT: normocephalic, atraumatic, anicteric sclera, pink conjunctiva, pupils equal and reactive to light and accomodation, oropharynx clear, dry mucosal membranes Neck: supple, no masses or lymphadenopathy, no goiter, no bruits  Heart: Tachycardia, Regular rate and rhythm, without murmurs, rubs or gallops. Lungs: Clear to auscultation bilaterally, no wheezing, rales or rhonchi. Abdomen: Soft, nontender, nondistended, positive bowel sounds, no masses. No significant CVA angle tenderness noted Extremities: No clubbing, cyanosis or edema with positive pedal pulses. Neuro: Grossly intact, no focal neurological deficits, strength 5/5 upper and lower extremities bilaterally Psych: alert and oriented x 3, normal mood and affect Skin: no rashes or lesions, warm and dry   LABS on Admission:  Basic Metabolic Panel:  Recent Labs Lab 04/03/14 1359  NA 137  K 4.0  CL  104  CO2 27  GLUCOSE 131*  BUN 14  CREATININE 1.22*  CALCIUM 9.1   Liver Function Tests:  Recent Labs Lab 04/03/14 1359  AST 136*  ALT 104*  ALKPHOS 128*  BILITOT 1.6*  PROT 6.5  ALBUMIN 3.4*   No results for input(s): LIPASE, AMYLASE in the last 168 hours. No results for input(s): AMMONIA in the last 168 hours. CBC:  Recent Labs Lab 04/03/14 1359  WBC 6.2  HGB 13.2  HCT 38.6  MCV 91.0  PLT 189   Cardiac Enzymes: No results for input(s): CKTOTAL, CKMB, CKMBINDEX, TROPONINI in the last 168 hours. BNP: Invalid input(s): POCBNP CBG: No results for input(s): GLUCAP in the last 168 hours.   Radiological Exams on Admission: Dg Chest Port 1 View  04/03/2014   CLINICAL DATA:  Cough and chills  EXAM: PORTABLE CHEST - 1 VIEW  COMPARISON:  February 25, 2014  FINDINGS: There is no edema or consolidation. Heart is upper normal in size with pulmonary vascularity within normal limits. There is atherosclerotic change in aorta. No adenopathy. No bone lesions. Slight elevation the right hemidiaphragm is stable.  IMPRESSION: No edema or consolidation.   Electronically Signed   By: Bretta Bang M.D.   On: 04/03/2014 16:07    Assessment/Plan Principal Problem:   Sepsis/  SIRS likely due to UTI, chest x-ray was negative for pneumonia with lactic acidosis, fevers, tachycardia - Obtain pro-calcitonin, urine cultures, blood cultures, influenza panel placed on IV fluid hydration - will place on cefepime, EDP started patient on a Zithromax, will continue for today. If respiratory status remains stable, will DC Zithromax and continue cephalosporins only. - Continue aggressive IV fluid hydration - Obtain CT abdomen and pelvis, rule out pyelonephritis, intra-abdominal pathology secondary to complaints of left-sided abdominal pain  Active Problems:   Hypothyroidism - check TSH, continue Synthroid     Hyperlipidemia - Hold statin secondary to transaminitis  Transaminitis - Elevated  liver enzymes possibly due to sepsis, hold statins - Obtain hepatitis panel    GERD - Continue PPI  Mild acute renal insufficiency: Likely due to #1, UTI - Continue IV fluid hydration  Hyperglycemia: check hemoglobin A1c  DVT prophylaxis: Lovenox  CODE STATUS: Full code discussed with the patient  Family Communication: Admission, patients condition and plan of care including tests being ordered have been discussed with the patient and family  who indicates understanding and agree with the plan and Code Status   Further plan will depend as patient's clinical course evolves and further radiologic and laboratory data become available.   Time Spent on Admission: 1 hour  RAI,RIPUDEEP M.D. [facility] 04/03/2014, 5:48 PM Pager: [phone]  If 7PM-7AM, please contact night-coverage [email or web address] Password [email or web address]

## 2014-04-03 NOTE — ED Provider Notes (Signed)
CSN: [id number]     Arrival date & time 04/03/14  1340 History   First MD Initiated Contact with Patient 04/03/14 1406     Chief Complaint  Patient presents with  . Cough  . Chills  . Weakness     Patient is a 74 y.o. female presenting with cough and weakness. The history is provided by the patient and the spouse. No language interpreter was used.  Cough Weakness   Ms. Ricchio presents for evaluation of chills and cough. She reports feeling poorly since last night. She's been having shaking chills since 12:30 in the morning. She reports a mild nonproductive cough. She had one episode of emesis prior to ED arrival. [facility] had left lower quadrant pain for the last 2-3 weeks that is burning in nature and feels like her prior shingles pain. She denies any chest pain, diarrhea, dysuria. She denies any sick contacts. Symptoms are moderate, constant, worsening.  Past Medical History  Diagnosis Date  . Cancer 2000    breast had lumpectomy/radiation x36,mammo ,no chemo  . Depression   . GERD (gastroesophageal reflux disease)   . Hyperlipidemia   . Hypothyroid   . Frozen shoulder   . Osteopenia     BMD 2004, WNL 2008  . Shingles     chronic body pain, left side of body  . Dysuria-frequency syndrome     takes AZO  . Zoster     Hx of  . Hypothyroid    Past Surgical History  Procedure Laterality Date  . Breast surgery  2000    lumpectomy   Family History  Problem Relation Age of Onset  . Osteoporosis Mother   . Parkinsonism Father   . Cancer Sister     breast CA  . Cancer Other     breast CA  . Heart disease Maternal Grandmother   . Heart disease Maternal Grandfather   . Coronary artery disease Sister    History  Substance Use Topics  . Smoking status: Never Smoker   . Smokeless tobacco: Never Used  . Alcohol Use: No   OB History    No data available     Review of Systems  Respiratory: Positive for cough.   Neurological: Positive for weakness.  All other systems  reviewed and are negative.     Allergies  Citalopram hydrobromide  Home Medications   Prior to Admission medications   Medication Sig Start Date End Date Taking? Authorizing Provider  atorvastatin (LIPITOR) 20 MG tablet TAKE ONE TABLET BY MOUTH ONCE DAILY 12/31/13   Abner Greenspan, MD  carboxymethylcellulose 1 % ophthalmic solution Apply 1 drop to eye 3 (three) times daily as needed (for dry eyes).    Historical Provider, MD  Cholecalciferol (VITAMIN D3) 2000 UNITS capsule Take 2,000 Units by mouth daily.      Historical Provider, MD  Levothyroxine Sodium 50 MCG CAPS Take 1 capsule (50 mcg total) by mouth daily before breakfast. 09/16/13   Troy Sine, MD  meclizine (ANTIVERT) 25 MG tablet One pill by mouth at bedtime as needed for dizziness 02/05/11   Abner Greenspan, MD  Multiple Vitamin (MULTIVITAMIN) tablet Take 1 tablet by mouth daily.      Historical Provider, MD  Omega-3 Fatty Acids (FISH OIL PO) Take 1 capsule by mouth daily.    Historical Provider, MD  omeprazole (PRILOSEC OTC) 20 MG tablet Take 20 mg by mouth daily.    Historical Provider, MD  vitamin C (ASCORBIC ACID) 500 MG  tablet Take 500 mg by mouth daily.    Historical Provider, MD   BP 122/63 mmHg  Pulse 128  Temp(Src) 103.4 F (39.7 C) (Rectal)  Resp 18  SpO2 95% Physical Exam  Constitutional: She is oriented to person, place, and time. She appears well-developed and well-nourished.  HENT:  Head: Normocephalic and atraumatic.  Cardiovascular: Normal rate and regular rhythm.   tachycardic  Pulmonary/Chest: Effort normal. No respiratory distress.  Rhonchi in right lung base  Abdominal: Soft. There is no tenderness. There is no rebound and no guarding.  Musculoskeletal: She exhibits no edema or tenderness.  Neurological: She is alert and oriented to person, place, and time.  Skin: Skin is warm and dry. No rash noted.  Psychiatric: She has a normal mood and affect. Her behavior is normal.  Nursing note and vitals  reviewed.   ED Course  Procedures (including critical care time) Labs Review Labs Reviewed  BRAIN NATRIURETIC PEPTIDE - Abnormal; Notable for the following:    B Natriuretic Peptide 121.2 (*)    All other components within normal limits  COMPREHENSIVE METABOLIC PANEL - Abnormal; Notable for the following:    Glucose, Bld 131 (*)    Creatinine, Ser 1.22 (*)    Albumin 3.4 (*)    AST 136 (*)    ALT 104 (*)    Alkaline Phosphatase 128 (*)    Total Bilirubin 1.6 (*)    GFR calc non Af Amer 43 (*)    GFR calc Af Amer 50 (*)    All other components within normal limits  URINALYSIS, ROUTINE W REFLEX MICROSCOPIC - Abnormal; Notable for the following:    Color, Urine AMBER (*)    APPearance CLOUDY (*)    Hgb urine dipstick LARGE (*)    Protein, ur 100 (*)    Nitrite POSITIVE (*)    Leukocytes, UA LARGE (*)    All other components within normal limits  URINE MICROSCOPIC-ADD ON - Abnormal; Notable for the following:    Bacteria, UA MANY (*)    All other components within normal limits  I-STAT CG4 LACTIC ACID, ED - Abnormal; Notable for the following:    Lactic Acid, Venous 2.13 (*)    All other components within normal limits  CULTURE, BLOOD (ROUTINE X 2)  CULTURE, BLOOD (ROUTINE X 2)  URINE CULTURE  CBC    Imaging Review Dg Chest Port 1 View  04/03/2014   CLINICAL DATA:  Cough and chills  EXAM: PORTABLE CHEST - 1 VIEW  COMPARISON:  February 25, 2014  FINDINGS: There is no edema or consolidation. Heart is upper normal in size with pulmonary vascularity within normal limits. There is atherosclerotic change in aorta. No adenopathy. No bone lesions. Slight elevation the right hemidiaphragm is stable.  IMPRESSION: No edema or consolidation.   Electronically Signed   By: Lowella Grip M.D.   On: 04/03/2014 16:07     EKG Interpretation None      MDM   Final diagnoses:  Acute UTI  Sepsis, due to unspecified organism    Pt here for chills, cough.  Initial concern for sepsis  with CAP given lung exam and sxs and pt started on abx for CAP.  CXR without evidence of pna, UA c/w UTI - treated with rocephin.  Plan to admit to medicine for further mgmt of UTI with sepsis.      Quintella Reichert, MD 04/03/14 [phone]

## 2014-04-04 ENCOUNTER — Inpatient Hospital Stay (HOSPITAL_COMMUNITY): Payer: Medicare HMO

## 2014-04-04 DIAGNOSIS — E039 Hypothyroidism, unspecified: Secondary | ICD-10-CM

## 2014-04-04 LAB — CBC
HEMATOCRIT: 33.7 % — AB (ref 36.0–46.0)
Hemoglobin: 11.1 g/dL — ABNORMAL LOW (ref 12.0–15.0)
MCH: 30.8 pg (ref 26.0–34.0)
MCHC: 32.9 g/dL (ref 30.0–36.0)
MCV: 93.6 fL (ref 78.0–100.0)
Platelets: 160 10*3/uL (ref 150–400)
RBC: 3.6 MIL/uL — ABNORMAL LOW (ref 3.87–5.11)
RDW: 13.8 % (ref 11.5–15.5)
WBC: 12.7 10*3/uL — ABNORMAL HIGH (ref 4.0–10.5)

## 2014-04-04 LAB — BASIC METABOLIC PANEL
ANION GAP: 4 — AB (ref 5–15)
BUN: 11 mg/dL (ref 6–23)
CO2: 25 mmol/L (ref 19–32)
Calcium: 7.8 mg/dL — ABNORMAL LOW (ref 8.4–10.5)
Chloride: 108 mmol/L (ref 96–112)
Creatinine, Ser: 1.17 mg/dL — ABNORMAL HIGH (ref 0.50–1.10)
GFR calc Af Amer: 52 mL/min — ABNORMAL LOW (ref >90)
GFR calc non Af Amer: 45 mL/min — ABNORMAL LOW (ref >90)
Glucose, Bld: 132 mg/dL — ABNORMAL HIGH (ref 70–99)
Potassium: 3.6 mmol/L (ref 3.5–5.1)
Sodium: 137 mmol/L (ref 135–145)

## 2014-04-04 LAB — INFLUENZA PANEL BY PCR (TYPE A & B)
H1N1FLUPCR: NOT DETECTED
INFLAPCR: NEGATIVE
Influenza B By PCR: NEGATIVE

## 2014-04-04 LAB — CLOSTRIDIUM DIFFICILE BY PCR: Toxigenic C. Difficile by PCR: NEGATIVE

## 2014-04-04 MED ORDER — PNEUMOCOCCAL VAC POLYVALENT 25 MCG/0.5ML IJ INJ
0.5000 mL | INJECTION | INTRAMUSCULAR | Status: DC
  Filled 2014-04-04: qty 0.5

## 2014-04-04 MED ORDER — DIPHENHYDRAMINE HCL 25 MG PO CAPS
25.0000 mg | ORAL_CAPSULE | Freq: Four times a day (QID) | ORAL | Status: DC | PRN
  Administered 2014-04-04: 25 mg via ORAL
  Filled 2014-04-04: qty 1

## 2014-04-04 MED ORDER — TRAMADOL HCL 50 MG PO TABS
50.0000 mg | ORAL_TABLET | Freq: Once | ORAL | Status: AC
  Administered 2014-04-04: 50 mg via ORAL
  Filled 2014-04-04: qty 1

## 2014-04-04 MED ORDER — GUAIFENESIN-DM 100-10 MG/5ML PO SYRP
5.0000 mL | ORAL_SOLUTION | ORAL | Status: DC | PRN
  Administered 2014-04-04: 5 mL via ORAL
  Filled 2014-04-04: qty 5

## 2014-04-04 MED ORDER — ZOLPIDEM TARTRATE 5 MG PO TABS
5.0000 mg | ORAL_TABLET | Freq: Once | ORAL | Status: AC
  Administered 2014-04-04: 5 mg via ORAL
  Filled 2014-04-04: qty 1

## 2014-04-04 MED ORDER — SODIUM CHLORIDE 0.9 % IV BOLUS (SEPSIS)
500.0000 mL | Freq: Once | INTRAVENOUS | Status: AC
  Administered 2014-04-04: 500 mL via INTRAVENOUS

## 2014-04-04 MED ORDER — ALBUTEROL SULFATE (2.5 MG/3ML) 0.083% IN NEBU
2.5000 mg | INHALATION_SOLUTION | Freq: Four times a day (QID) | RESPIRATORY_TRACT | Status: DC | PRN
  Administered 2014-04-04 – 2014-04-05 (×2): 2.5 mg via RESPIRATORY_TRACT
  Filled 2014-04-04 (×2): qty 3

## 2014-04-04 MED ORDER — MENTHOL 3 MG MT LOZG
1.0000 | LOZENGE | OROMUCOSAL | Status: DC | PRN
  Filled 2014-04-04: qty 9

## 2014-04-04 MED ORDER — BENZONATATE 100 MG PO CAPS
100.0000 mg | ORAL_CAPSULE | Freq: Three times a day (TID) | ORAL | Status: DC
  Administered 2014-04-04 – 2014-04-06 (×7): 100 mg via ORAL
  Filled 2014-04-04 (×10): qty 1

## 2014-04-04 NOTE — Progress Notes (Signed)
Utilization review completed.  

## 2014-04-04 NOTE — Progress Notes (Signed)
Lab reported gram neg rods; Dr aware.

## 2014-04-04 NOTE — Progress Notes (Signed)
Patient ID: Frances Hoffman  female  ZOX:[id number]    DOB: 07-Jan-1941    DOA: 04/03/2014  PCP: Roxy Manns, MD  Brief history of present illness Patient is a 74 year old female with hypothyroidism, hyperlipidemia, recurrent UTIs presented to ED with fevers, chills, generalized weakness worsening since yesterday. History was obtained from the patient and her family (husband and son) in the room. Patient reported that she was in her normal baseline state of health up until the day before the admission when she started having shaking chills. She reported a mild nonproductive cough. Her husband gave her warm blankets, after dinner patient continued to complain of chills. She noted her temperature to be 76F at home. She also noticed left flank pain. On the morning of admission, patient woke up with similar shaking chills and one episode of emesis. In ED, patient was noted to have lactic acidosis 2.1, creatinine 1.2, mild transaminitis. She has noticed left-sided pain in the last 2 weeks, intermittent.  Assessment/Plan: Principal Problem: Sepsis/ SIRS likely due to UTI, chest x-ray was negative for pneumonia with lactic acidosis, fevers, tachycardia - pro-calcitonin elevated at 14.2, follow urine cultures, blood cultures. influenza panel negative. C. difficile negative -  placed on IV fluid hydration - will place on cefepime, and Zithromax - Continue aggressive IV fluid hydration - Obtain CT abdomen and pelvis negative for any acute intra-abdominal pathology.  Active Problems:  Hypothyroidism - check TSH, continue Synthroid    Hyperlipidemia - Hold statin secondary to transaminitis  Transaminitis - Elevated liver enzymes possibly due to sepsis, hold statins - Obtain hepatitis panel, check CMET    GERD - Continue PPI  Mild acute renal insufficiency: Likely due to #1, UTI - Continue IV fluid hydration, improving Hyperglycemia: check hemoglobin A1c  DVT Prophylaxis: Lovenox  Code  Status: Full CODE STATUS  Family Communication:  Disposition:  Consultants:  None  Procedures:  CT abdomen and pelvis  Antibiotics:  IV Zithromax  IV cefepime  Subjective: Patient seen and examined, had diarrhea last night however no chest pain, shortness of breath, nausea vomiting this morning, feeling somewhat better  Objective: Weight change:   Intake/Output Summary (Last 24 hours) at 04/04/14 1133 Last data filed at 04/04/14 0600  Gross per 24 hour  Intake   1820 ml  Output    200 ml  Net   1620 ml   Blood pressure 110/54, pulse 99, temperature 97.7 F (36.5 C), temperature source Oral, resp. rate 24, height 5\' 4"  (1.626 m), weight 78.7 kg (173 lb 8 oz), SpO2 87 %.  Physical Exam: General: Alert and awake, oriented x3, not in any acute distress. CVS: S1-S2 clear, no murmur rubs or gallops Chest: clear to auscultation bilaterally, no wheezing, rales or rhonchi Abdomen: soft nontender, nondistended, normal bowel sounds  Extremities: no cyanosis, clubbing or edema noted bilaterally Neuro: Cranial nerves II-XII intact, no focal neurological deficits  Lab Results: Basic Metabolic Panel:  Recent Labs Lab 04/03/14 1359 04/04/14 0215  NA 137 137  K 4.0 3.6  CL 104 108  CO2 27 25  GLUCOSE 131* 132*  BUN 14 11  CREATININE 1.22* 1.17*  CALCIUM 9.1 7.8*   Liver Function Tests:  Recent Labs Lab 04/03/14 1359  AST 136*  ALT 104*  ALKPHOS 128*  BILITOT 1.6*  PROT 6.5  ALBUMIN 3.4*   No results for input(s): LIPASE, AMYLASE in the last 168 hours. No results for input(s): AMMONIA in the last 168 hours. CBC:  Recent Labs Lab 04/03/14  1359 04/04/14 0215  WBC 6.2 12.7*  HGB 13.2 11.1*  HCT 38.6 33.7*  MCV 91.0 93.6  PLT 189 160   Cardiac Enzymes: No results for input(s): CKTOTAL, CKMB, CKMBINDEX, TROPONINI in the last 168 hours. BNP: Invalid input(s): POCBNP CBG: No results for input(s): GLUCAP in the last 168 hours.   Micro  Results: Recent Results (from the past 240 hour(s))  MRSA PCR Screening     Status: Abnormal   Collection Time: 04/03/14  6:25 PM  Result Value Ref Range Status   MRSA by PCR POSITIVE (A) NEGATIVE Final    Comment:        The GeneXpert MRSA Assay (FDA approved for NASAL specimens only), is one component of a comprehensive MRSA colonization surveillance program. It is not intended to diagnose MRSA infection nor to guide or monitor treatment for MRSA infections. RESULT CALLED TO, READ BACK BY AND VERIFIED WITH: D DILLON RN 2227 04/03/14 A BROWNING   Clostridium Difficile by PCR     Status: None   Collection Time: 04/04/14 12:35 AM  Result Value Ref Range Status   C difficile by pcr NEGATIVE NEGATIVE Final    Studies/Results: Dg Chest 2 View  04/04/2014   CLINICAL DATA:  Cough since yesterday with admission for UTI yesterday. FA:OZHYQM Ca  EXAM: CHEST - 2 VIEW  COMPARISON:  the previous day's study  FINDINGS: New patchy airspace opacities in the basal segments of the left lower lobe. There is a smaller focal airspace density also noted in the right lower lung the tract slow. Small pleural effusions have developed, left greater than right. Heart size remains normal. Atheromatous aorta.  Visualized skeletal structures are unremarkable. Residual oral Contrast noted in the visualized upper abdomen.  IMPRESSION: 1. New bibasilar airspace disease left greater than right, with small pleural effusions.   Electronically Signed   By: Oley Balm M.D.   On: 04/04/2014 09:31   Ct Abdomen Pelvis W Contrast  04/03/2014   CLINICAL DATA:  Diarrhea and left-sided abdominal pain for 1 day.  EXAM: CT ABDOMEN AND PELVIS WITH CONTRAST  TECHNIQUE: Multidetector CT imaging of the abdomen and pelvis was performed using the standard protocol following bolus administration of intravenous contrast.  CONTRAST:  80mL OMNIPAQUE IOHEXOL 300 MG/ML  SOLN  COMPARISON:  10/29/2012  FINDINGS: Benign right hepatic lobe  lesions are again evident without significant change from 10/29/2012. The largest measures 19 mm in the right hepatic lobe dome. No new or suspicious liver lesions are evident. There is no bile duct dilatation. Multiple small calculi are present in the gallbladder lumen.  The pancreas, spleen, adrenals and kidneys appear normal.  There are several para-aortic retroperitoneal nodes which are mildly prominent in number although not pathologic by size. There also are a few nodes in the root of the small bowel mesentery. These nodes are not significantly changed from 10/29/2012.  The stomach, small bowel and colon are unremarkable in appearance. There is no bowel obstruction. There is no focal inflammatory change. The appendix is normal.  Uterus and adnexal structures appear unremarkable.  There is no ascites. There is no free intraperitoneal air. There is no hernia. No significant abnormalities are evident in the lower chest.  IMPRESSION: There are a few periaortic and mesenteric root nodes which are prominent in number although not pathologic by size. These are indeterminate but are unchanged from 10/29/2012.  No acute findings are evident in the abdomen or pelvis.  Cholelithiasis   Electronically Signed   By:  Ellery Plunk M.D.   On: 04/03/2014 23:15   Dg Chest Port 1 View  04/03/2014   CLINICAL DATA:  Cough and chills  EXAM: PORTABLE CHEST - 1 VIEW  COMPARISON:  February 25, 2014  FINDINGS: There is no edema or consolidation. Heart is upper normal in size with pulmonary vascularity within normal limits. There is atherosclerotic change in aorta. No adenopathy. No bone lesions. Slight elevation the right hemidiaphragm is stable.  IMPRESSION: No edema or consolidation.   Electronically Signed   By: Bretta Bang M.D.   On: 04/03/2014 16:07    Medications: Scheduled Meds: . azithromycin  500 mg Intravenous Q24H  . benzonatate  100 mg Oral TID  . ceFEPime (MAXIPIME) IV  2 g Intravenous Q24H  .  Chlorhexidine Gluconate Cloth  6 each Topical Q0600  . cholecalciferol  2,000 Units Oral Daily  . enoxaparin (LOVENOX) injection  40 mg Subcutaneous Q24H  . levothyroxine  50 mcg Oral QAC breakfast  . multivitamin with minerals  1 tablet Oral Daily  . mupirocin ointment  1 application Nasal BID  . pantoprazole  40 mg Oral Daily  . vitamin C  500 mg Oral Daily   Time spent 25 minutes   LOS: 1 day   RAI,RIPUDEEP M.D. [facility] 04/04/2014, 11:33 AM Pager: [phone]  If 7PM-7AM, please contact night-coverage [email or web address] Password [email or web address]

## 2014-04-04 NOTE — Evaluation (Signed)
Physical Therapy Evaluation Patient Details Name: Frances Hoffman MRN: [id number] DOB: Jan 26, 1941 Today's Date: 04/04/2014   History of Present Illness  Patient is a 74 year old female with hypothyroidism, hyperlipidemia, recurrent UTIs presented to ED with fevers, chills, generalized weakness worsening since yesterday. History was obtained from the patient and her family (husband and son) in the room. Patient reported that she was in her normal baseline state of health up until yesterday evening when she started having shaking chills. She reported a mild nonproductive cough.  Clinical Impression  Pt independent with mobility, though desat to 85% on RA with activity. Returned to 94% when placed back on 2L O2. Instructed pt to ambulate 3-5x/ day and change positions frequently. No further acute or f/u PT needs at this time.     Follow Up Recommendations No PT follow up    Equipment Recommendations  None recommended by PT    Recommendations for Other Services       Precautions / Restrictions Precautions Precautions: None Restrictions Weight Bearing Restrictions: No      Mobility  Bed Mobility Overal bed mobility: Independent                Transfers Overall transfer level: Independent Equipment used: None                Ambulation/Gait Ambulation/Gait assistance: Independent Ambulation Distance (Feet): 50 Feet Assistive device: None Gait Pattern/deviations: WFL(Within Functional Limits) Gait velocity: WFL Gait velocity interpretation: at or above normal speed for age/gender    Stairs            Wheelchair Mobility    Modified Rankin (Stroke Patients Only)       Balance Overall balance assessment: No apparent balance deficits (not formally assessed)                                           Pertinent Vitals/Pain Pain Assessment: No/denies pain    Home Living Family/patient expects to be discharged to:: Private  residence Living Arrangements: Spouse/significant other Available Help at Discharge: Family;Available 24 hours/day Type of Home: House Home Access: Stairs to enter Entrance Stairs-Rails: None Entrance Stairs-Number of Steps: 4 Home Layout: One level Home Equipment: None      Prior Function Level of Independence: Independent         Comments: pt retired, still drives     Higher education careers adviser        Extremity/Trunk Assessment   Upper Extremity Assessment: Overall WFL for tasks assessed           Lower Extremity Assessment: Overall WFL for tasks assessed      Cervical / Trunk Assessment: Normal  Communication   Communication: No difficulties  Cognition Arousal/Alertness: Awake/alert Behavior During Therapy: WFL for tasks assessed/performed Overall Cognitive Status: Within Functional Limits for tasks assessed                      General Comments General comments (skin integrity, edema, etc.): On RA, pt desat to 85%, asymptomatic, increased to 94% when placed back on 2L O2    Exercises        Assessment/Plan    PT Assessment Patent does not need any further PT services  PT Diagnosis     PT Problem List    PT Treatment Interventions     PT Goals (Current goals can be found in the  Care Plan section) Acute Rehab PT Goals Patient Stated Goal: return home PT Goal Formulation: All assessment and education complete, DC therapy    Frequency     Barriers to discharge        Co-evaluation               End of Session Equipment Utilized During Treatment: Oxygen Activity Tolerance: Patient tolerated treatment well Patient left: in chair;with call bell/phone within reach Nurse Communication: Mobility status         Time: 1152-1212 PT Time Calculation (min) (ACUTE ONLY): 20 min   Charges:   PT Evaluation $Initial PT Evaluation Tier I: 1 Procedure     PT G Codes:      Lyanne Co, PT  Acute Rehab Services  [phone]   Lyanne Co 04/04/2014, 12:19 PM

## 2014-04-05 LAB — COMPREHENSIVE METABOLIC PANEL
ALBUMIN: 2.6 g/dL — AB (ref 3.5–5.2)
ALK PHOS: 111 U/L (ref 39–117)
ALT: 52 U/L — ABNORMAL HIGH (ref 0–35)
AST: 39 U/L — AB (ref 0–37)
Anion gap: 4 — ABNORMAL LOW (ref 5–15)
BUN: 9 mg/dL (ref 6–23)
CO2: 23 mmol/L (ref 19–32)
CREATININE: 0.94 mg/dL (ref 0.50–1.10)
Calcium: 8.4 mg/dL (ref 8.4–10.5)
Chloride: 111 mmol/L (ref 96–112)
GFR calc non Af Amer: 59 mL/min — ABNORMAL LOW (ref >90)
GFR, EST AFRICAN AMERICAN: 68 mL/min — AB (ref >90)
Glucose, Bld: 108 mg/dL — ABNORMAL HIGH (ref 70–99)
Potassium: 3.9 mmol/L (ref 3.5–5.1)
Sodium: 138 mmol/L (ref 135–145)
Total Bilirubin: 0.8 mg/dL (ref 0.3–1.2)
Total Protein: 5.5 g/dL — ABNORMAL LOW (ref 6.0–8.3)

## 2014-04-05 LAB — HEMOGLOBIN A1C
HEMOGLOBIN A1C: 5.6 % (ref 4.8–5.6)
Mean Plasma Glucose: 114 mg/dL

## 2014-04-05 LAB — CBC
HEMATOCRIT: 34.9 % — AB (ref 36.0–46.0)
HEMOGLOBIN: 11.3 g/dL — AB (ref 12.0–15.0)
MCH: 30.8 pg (ref 26.0–34.0)
MCHC: 32.4 g/dL (ref 30.0–36.0)
MCV: 95.1 fL (ref 78.0–100.0)
Platelets: 139 10*3/uL — ABNORMAL LOW (ref 150–400)
RBC: 3.67 MIL/uL — AB (ref 3.87–5.11)
RDW: 14.2 % (ref 11.5–15.5)
WBC: 10.6 10*3/uL — AB (ref 4.0–10.5)

## 2014-04-05 LAB — URINE CULTURE
COLONY COUNT: NO GROWTH
Culture: NO GROWTH

## 2014-04-05 LAB — PROCALCITONIN: PROCALCITONIN: 9.88 ng/mL

## 2014-04-05 NOTE — Progress Notes (Signed)
Pt transfer to 3E11 per MD order. Report called to receiving nurse and all questions answered.

## 2014-04-05 NOTE — Progress Notes (Signed)
PT  Note  Patient Details Name: Frances Hoffman MRN: [id number] DOB: Apr 19, 1940   Cancelled Treatment:    Reason Eval/Treat Not Completed: PT screened, no needs identified, will sign off. Please see PT eval that was completed 04/04/2014. Pt independent with mobility and no skilled PT needs. Per eval pt was instructed to amb 3-5 times/day.   [place] 04/05/2014, 1:32 PM

## 2014-04-05 NOTE — Progress Notes (Signed)
Patient ID: Frances Hoffman  female  UEA:[id number]    DOB: [birth year]    DOA: 04/03/2014  PCP: Frances Manns, MD  Brief history of present illness Patient is a 74 year old female with hypothyroidism, hyperlipidemia, recurrent UTIs presented to ED with fevers, chills, generalized weakness worsening since yesterday. History was obtained from the patient and her family (husband and son) in the room. Patient reported that she was in her normal baseline state of health up until the day before the admission when she started having shaking chills. She reported a mild nonproductive cough. Her husband gave her warm blankets, after dinner patient continued to complain of chills. She noted her temperature to be 32F at home. She also noticed left flank pain. On the morning of admission, patient woke up with similar shaking chills and one episode of emesis. In ED, patient was noted to have lactic acidosis 2.1, creatinine 1.2, mild transaminitis. She has noticed left-sided pain in the last 2 weeks, intermittent.  Assessment/Plan: Principal Problem: Escherichia coli bacteremia / SIRS likely due to Escherichia coli UTI, chest x-ray was negative for pneumonia, lactic acidosis, fevers, tachycardia - pro-calcitonin elevated at 14.2, improving to 9.8 - influenza panel negative. C. difficile negative -  Continue cefepime, follow blood cultures and urine culture results/sensitivities - Obtain CT abdomen and pelvis negative for any acute intra-abdominal pathology.  Active Problems:  Hypothyroidism -  continue Synthroid    Hyperlipidemia - Hold statin secondary to transaminitis  Transaminitis - Elevated liver enzymes possibly due to sepsis, hold statins, improving   GERD - Continue PPI  Acute kidney injury: Likely prerenal secondary to sepsis and UTI  Patient eating well, creatinine function has normalized, DC IV fluids   Hyperglycemia: Likely due to acute illness - Hemoglobin A1c 5.6   DVT prophylaxis  : lovenox  Code Status: Full CODE STATUS  Family Communication: unable to reach patient's husband, discussed in detail with the patient's son, Frances Hoffman   Disposition: transfer to telemetry today, increase ambulation PTOT   Consultants:  None  Procedures:  CT abdomen and pelvis  Antibiotics:  IV Zithromax discontinued   IV cefepime  Subjective:  patient seen and examined, feeling a whole lot better today, denies any nausea, vomiting, abdominal pain, diarrhea   Objective: Weight change:   Intake/Output Summary (Last 24 hours) at 04/05/14 1116 Last data filed at 04/05/14 0700  Gross per 24 hour  Intake 2476.67 ml  Output      0 ml  Net 2476.67 ml   Blood pressure 114/60, pulse 92, temperature 98.1 F (36.7 C), temperature source Oral, resp. rate 18, height 5\' 4"  (1.626 m), weight 78.7 kg (173 lb 8 oz), SpO2 94 %.  Physical Exam: General:  Ax O x3, NAD CVS: S1-S2 clear, no murmur rubs or gallops Chest:  fairly CTA B bdomen: soft nontender, nondistended, normal bowel sounds  Extremities: no cyanosis, clubbing or edema noted bilaterally Neuro: Cranial nerves II-XII intact, no focal neurological deficits  Lab Results: Basic Metabolic Panel:  Recent Labs Lab 04/04/14 0215 04/05/14 0315  NA 137 138  K 3.6 3.9  CL 108 111  CO2 25 23  GLUCOSE 132* 108*  BUN 11 9  CREATININE 1.17* 0.94  CALCIUM 7.8* 8.4   Liver Function Tests:  Recent Labs Lab 04/03/14 1359 04/05/14 0315  AST 136* 39*  ALT 104* 52*  ALKPHOS 128* 111  BILITOT 1.6* 0.8  PROT 6.5 5.5*  ALBUMIN 3.4* 2.6*   No results for input(s): LIPASE, AMYLASE  in the last 168 hours. No results for input(s): AMMONIA in the last 168 hours. CBC:  Recent Labs Lab 04/04/14 0215 04/05/14 0315  WBC 12.7* 10.6*  HGB 11.1* 11.3*  HCT 33.7* 34.9*  MCV 93.6 95.1  PLT 160 139*   Cardiac Enzymes: No results for input(s): CKTOTAL, CKMB, CKMBINDEX, TROPONINI in the last 168 hours. BNP: Invalid  input(s): POCBNP CBG: No results for input(s): GLUCAP in the last 168 hours.   Micro Results: Recent Results (from the past 240 hour(s))  Blood Culture (routine x 2)     Status: None (Preliminary result)   Collection Time: 04/03/14  2:36 PM  Result Value Ref Range Status   Specimen Description BLOOD RIGHT HAND  Final   Special Requests BOTTLES DRAWN AEROBIC AND ANAEROBIC 5CC  Final   Culture   Final           BLOOD CULTURE RECEIVED NO GROWTH TO DATE CULTURE WILL BE HELD FOR 5 DAYS BEFORE ISSUING A FINAL NEGATIVE REPORT Performed at [facility]    Report Status PENDING  Incomplete  Urine culture     Status: None (Preliminary result)   Collection Time: 04/03/14  2:40 PM  Result Value Ref Range Status   Specimen Description URINE, CLEAN CATCH  Final   Special Requests NONE  Final   Colony Count   Final    >=100,000 COLONIES/ML Performed at [facility]    Culture   Final    ESCHERICHIA COLI Performed at [facility]    Report Status PENDING  Incomplete  Blood Culture (routine x 2)     Status: None (Preliminary result)   Collection Time: 04/03/14  5:53 PM  Result Value Ref Range Status   Specimen Description BLOOD LEFT WRIST  Final   Special Requests BOTTLES DRAWN AEROBIC AND ANAEROBIC 5CC  Final   Culture   Final    ESCHERICHIA COLI Note: Gram Stain Report Called to,Read Back By and Verified With: [facility] VAUGHN 04/04/14 @ 12:46PM BY RUSCOE A. Performed at [facility]    Report Status PENDING  Incomplete  MRSA PCR Screening     Status: Abnormal   Collection Time: 04/03/14  6:25 PM  Result Value Ref Range Status   MRSA by PCR POSITIVE (A) NEGATIVE Final    Comment:        The GeneXpert MRSA Assay (FDA approved for NASAL specimens only), is one component of a comprehensive MRSA colonization surveillance program. It is not intended to diagnose MRSA infection nor to guide or monitor treatment for MRSA infections. RESULT CALLED TO, READ  BACK BY AND VERIFIED WITH: D DILLON RN 2227 04/03/14 A BROWNING   Clostridium Difficile by PCR     Status: None   Collection Time: 04/04/14 12:35 AM  Result Value Ref Range Status   C difficile by pcr NEGATIVE NEGATIVE Final    Studies/Results: Dg Chest 2 View  04/04/2014   CLINICAL DATA:  Cough since yesterday with admission for UTI yesterday. GE:XBMWUX Ca  EXAM: CHEST - 2 VIEW  COMPARISON:  the previous day's study  FINDINGS: New patchy airspace opacities in the basal segments of the left lower lobe. There is a smaller focal airspace density also noted in the right lower lung the tract slow. Small pleural effusions have developed, left greater than right. Heart size remains normal. Atheromatous aorta.  Visualized skeletal structures are unremarkable. Residual oral Contrast noted in the visualized upper abdomen.  IMPRESSION: 1. New bibasilar airspace disease  left greater than right, with small pleural effusions.   Electronically Signed   By: Oley Balm M.D.   On: 04/04/2014 09:31   Ct Abdomen Pelvis W Contrast  04/03/2014   CLINICAL DATA:  Diarrhea and left-sided abdominal pain for 1 day.  EXAM: CT ABDOMEN AND PELVIS WITH CONTRAST  TECHNIQUE: Multidetector CT imaging of the abdomen and pelvis was performed using the standard protocol following bolus administration of intravenous contrast.  CONTRAST:  80mL OMNIPAQUE IOHEXOL 300 MG/ML  SOLN  COMPARISON:  10/29/2012  FINDINGS: Benign right hepatic lobe lesions are again evident without significant change from 10/29/2012. The largest measures 19 mm in the right hepatic lobe dome. No new or suspicious liver lesions are evident. There is no bile duct dilatation. Multiple small calculi are present in the gallbladder lumen.  The pancreas, spleen, adrenals and kidneys appear normal.  There are several para-aortic retroperitoneal nodes which are mildly prominent in number although not pathologic by size. There also are a few nodes in the root of the small  bowel mesentery. These nodes are not significantly changed from 10/29/2012.  The stomach, small bowel and colon are unremarkable in appearance. There is no bowel obstruction. There is no focal inflammatory change. The appendix is normal.  Uterus and adnexal structures appear unremarkable.  There is no ascites. There is no free intraperitoneal air. There is no hernia. No significant abnormalities are evident in the lower chest.  IMPRESSION: There are a few periaortic and mesenteric root nodes which are prominent in number although not pathologic by size. These are indeterminate but are unchanged from 10/29/2012.  No acute findings are evident in the abdomen or pelvis.  Cholelithiasis   Electronically Signed   By: Ellery Plunk M.D.   On: 04/03/2014 23:15   Dg Chest Port 1 View  04/03/2014   CLINICAL DATA:  Cough and chills  EXAM: PORTABLE CHEST - 1 VIEW  COMPARISON:  February 25, 2014  FINDINGS: There is no edema or consolidation. Heart is upper normal in size with pulmonary vascularity within normal limits. There is atherosclerotic change in aorta. No adenopathy. No bone lesions. Slight elevation the right hemidiaphragm is stable.  IMPRESSION: No edema or consolidation.   Electronically Signed   By: Bretta Bang M.D.   On: 04/03/2014 16:07    Medications: Scheduled Meds: . benzonatate  100 mg Oral TID  . ceFEPime (MAXIPIME) IV  2 g Intravenous Q24H  . Chlorhexidine Gluconate Cloth  6 each Topical Q0600  . cholecalciferol  2,000 Units Oral Daily  . enoxaparin (LOVENOX) injection  40 mg Subcutaneous Q24H  . levothyroxine  50 mcg Oral QAC breakfast  . multivitamin with minerals  1 tablet Oral Daily  . mupirocin ointment  1 application Nasal BID  . pantoprazole  40 mg Oral Daily  . pneumococcal 23 valent vaccine  0.5 mL Intramuscular Tomorrow-1000  . vitamin C  500 mg Oral Daily   Time spent 25 minutes   LOS: 2 days   RAI,RIPUDEEP M.D. [facility] 04/05/2014, 11:16 AM Pager:  [phone]  If 7PM-7AM, please contact night-coverage [email or web address] Password [email or web address]

## 2014-04-05 NOTE — Clinical Documentation Improvement (Signed)
Please specify diagnosis related to below supporting information, if appropriate.   Possible Clinical Conditions?   Acute Renal Failure/Acute Kidney Injury Acute Tubular Necrosis Acute on Chronic Renal Failure Chronic Renal Failure Other Condition Cannot Clinically Determine   Supporting Information:  PROGRESS 04/04/2014   Active Problems:  Mild acute renal insufficiency: Likely due to #1, UTI  Continue IV fluid hydration, improving Hyperglycemia: check hemoglobin A1c   H&P 04/03/2014   Active Problems:  Mild acute renal insufficiency: Likely due to #1, UTI  Continue IV fluid hydration       Thank You, Serena Colonel ,RN Clinical Documentation Specialist:  [facility]

## 2014-04-06 LAB — COMPREHENSIVE METABOLIC PANEL
ALBUMIN: 2.8 g/dL — AB (ref 3.5–5.2)
ALT: 42 U/L — ABNORMAL HIGH (ref 0–35)
AST: 33 U/L (ref 0–37)
Alkaline Phosphatase: 123 U/L — ABNORMAL HIGH (ref 39–117)
Anion gap: 4 — ABNORMAL LOW (ref 5–15)
BILIRUBIN TOTAL: 0.4 mg/dL (ref 0.3–1.2)
BUN: <5 mg/dL — ABNORMAL LOW (ref 6–23)
CALCIUM: 8.5 mg/dL (ref 8.4–10.5)
CHLORIDE: 106 mmol/L (ref 96–112)
CO2: 29 mmol/L (ref 19–32)
Creatinine, Ser: 0.9 mg/dL (ref 0.50–1.10)
GFR calc Af Amer: 72 mL/min — ABNORMAL LOW (ref >90)
GFR calc non Af Amer: 62 mL/min — ABNORMAL LOW (ref >90)
GLUCOSE: 99 mg/dL (ref 70–99)
Potassium: 4.2 mmol/L (ref 3.5–5.1)
SODIUM: 139 mmol/L (ref 135–145)
Total Protein: 5.6 g/dL — ABNORMAL LOW (ref 6.0–8.3)

## 2014-04-06 LAB — RESPIRATORY VIRUS PANEL
Adenovirus: NEGATIVE
Influenza A: NEGATIVE
Influenza B: NEGATIVE
Metapneumovirus: NEGATIVE
Parainfluenza 1: NEGATIVE
Parainfluenza 2: NEGATIVE
Parainfluenza 3: NEGATIVE
Respiratory Syncytial Virus A: POSITIVE — AB
Respiratory Syncytial Virus B: NEGATIVE
Rhinovirus: NEGATIVE

## 2014-04-06 LAB — CBC
HCT: 34.1 % — ABNORMAL LOW (ref 36.0–46.0)
Hemoglobin: 11.2 g/dL — ABNORMAL LOW (ref 12.0–15.0)
MCH: 31.1 pg (ref 26.0–34.0)
MCHC: 32.8 g/dL (ref 30.0–36.0)
MCV: 94.7 fL (ref 78.0–100.0)
PLATELETS: 173 10*3/uL (ref 150–400)
RBC: 3.6 MIL/uL — AB (ref 3.87–5.11)
RDW: 13.7 % (ref 11.5–15.5)
WBC: 7.8 10*3/uL (ref 4.0–10.5)

## 2014-04-06 LAB — CULTURE, BLOOD (ROUTINE X 2)

## 2014-04-06 LAB — TSH: TSH: 13.582 u[IU]/mL — AB (ref 0.350–4.500)

## 2014-04-06 MED ORDER — ATORVASTATIN CALCIUM 20 MG PO TABS: 20.0000 mg | ORAL_TABLET | Freq: Every day | ORAL | Status: DC

## 2014-04-06 MED ORDER — CEPHALEXIN 500 MG PO CAPS
1000.0000 mg | ORAL_CAPSULE | Freq: Three times a day (TID) | ORAL | Status: DC
  Administered 2014-04-06: 1000 mg via ORAL
  Filled 2014-04-06 (×3): qty 2

## 2014-04-06 MED ORDER — LEVOTHYROXINE SODIUM 88 MCG PO TABS
88.0000 ug | ORAL_TABLET | Freq: Every day | ORAL | Status: DC
  Administered 2014-04-06: 88 ug via ORAL
  Filled 2014-04-06 (×2): qty 1

## 2014-04-06 MED ORDER — CLONAZEPAM 0.5 MG PO TABS: 0.2500 mg | ORAL_TABLET | Freq: Every day | ORAL | Status: DC | PRN

## 2014-04-06 MED ORDER — LEVOTHYROXINE SODIUM 88 MCG PO TABS: 88.0000 ug | ORAL_TABLET | Freq: Every day | ORAL | Status: DC

## 2014-04-06 MED ORDER — CEPHALEXIN 500 MG PO CAPS: 1000.0000 mg | ORAL_CAPSULE | Freq: Three times a day (TID) | ORAL | Status: DC

## 2014-04-06 MED ORDER — CEPHALEXIN 500 MG PO CAPS
500.0000 mg | ORAL_CAPSULE | Freq: Three times a day (TID) | ORAL | Status: DC
  Filled 2014-04-06 (×4): qty 1

## 2014-04-06 MED ORDER — CLONAZEPAM 0.5 MG PO TABS
0.2500 mg | ORAL_TABLET | Freq: Every day | ORAL | Status: DC | PRN
  Administered 2014-04-06: 0.25 mg via ORAL
  Filled 2014-04-06: qty 1

## 2014-04-06 MED ORDER — TRAMADOL HCL 50 MG PO TABS: 50.0000 mg | ORAL_TABLET | Freq: Two times a day (BID) | ORAL | Status: DC | PRN

## 2014-04-06 MED ORDER — CEFUROXIME AXETIL 500 MG PO TABS
500.0000 mg | ORAL_TABLET | Freq: Two times a day (BID) | ORAL | Status: DC
  Filled 2014-04-06 (×3): qty 1

## 2014-04-06 NOTE — Discharge Summary (Signed)
Physician Discharge Summary  Patient ID: Frances Hoffman MRN: [id number] DOB/AGE: [birth year] 74 y.o.  Admit date: 04/03/2014 Discharge date: 04/06/2014  Primary Care Physician:  Roxy Manns, MD  Discharge Diagnoses:    . Escherichia coli Sepsis . Escherichia coli UTI (urinary tract infection) . Hypothyroidism . GERD . Tachycardia . Hyperlipidemia  Consults:  None   Recommendations for Outpatient Follow-up:  Patient was placed on Keflex per sensitivities for another 12 days to complete the full course for Escherichia coli sepsis  TSH is 13.58, hence Synthroid was increased to 88 MCG daily  TESTS THAT NEED FOLLOW-UP TSH in next 4 weeks, please check BMET and LFTs at the time of follow-up.   DIET: Heart healthy diet    Allergies:   Allergies  Allergen Reactions  . Ciprofloxacin Other (See Comments)    Unk  . Citalopram Hydrobromide     Intrusive/ weird thoughts      Discharge Medications:   Medication List    STOP taking these medications        Levothyroxine Sodium 50 MCG Caps  Replaced by:  levothyroxine 88 MCG tablet      TAKE these medications        acetaminophen 500 MG tablet  Commonly known as:  TYLENOL  Take 1,000 mg by mouth every 8 (eight) hours as needed for fever (pain).     atorvastatin 20 MG tablet  Commonly known as:  LIPITOR  Take 1 tablet (20 mg total) by mouth daily.  Start taking on:  04/09/2014     carboxymethylcellulose 1 % ophthalmic solution  Apply 1 drop to eye 3 (three) times daily as needed (for dry eyes).     cephALEXin 500 MG capsule  Commonly known as:  KEFLEX  Take 2 capsules (1,000 mg total) by mouth 3 (three) times daily. X 12 more days     clonazePAM 0.5 MG tablet  Commonly known as:  KLONOPIN  Take 0.5 tablets (0.25 mg total) by mouth daily as needed (anxiety, depression).     FISH OIL PO  Take 1 capsule by mouth daily.     levothyroxine 88 MCG tablet  Commonly known as:  SYNTHROID, LEVOTHROID  Take 1 tablet  (88 mcg total) by mouth daily before breakfast.     meclizine 25 MG tablet  Commonly known as:  ANTIVERT  One pill by mouth at bedtime as needed for dizziness     multivitamin tablet  Take 1 tablet by mouth daily.     omeprazole 20 MG tablet  Commonly known as:  PRILOSEC OTC  Take 20 mg by mouth daily.     traMADol 50 MG tablet  Commonly known as:  ULTRAM  Take 1 tablet (50 mg total) by mouth every 12 (twelve) hours as needed for severe pain.     vitamin C 500 MG tablet  Commonly known as:  ASCORBIC ACID  Take 500 mg by mouth daily.     Vitamin D3 2000 UNITS capsule  Take 2,000 Units by mouth daily.         Brief H and P: For complete details please refer to admission H and P, but in briefPatient is a 74 year old female with hypothyroidism, hyperlipidemia, recurrent UTIs presented to ED with fevers, chills, generalized weakness worsening since yesterday. History was obtained from the patient and her family (husband and son) in the room. Patient reported that she was in her normal baseline state of health up until the day before the admission when she  started having shaking chills. She reported a mild nonproductive cough. Her husband gave her warm blankets, after dinner patient continued to complain of chills. She noted her temperature to be 31F at home. She also noticed left flank pain. On the morning of admission , patient woke up with similar shaking chills and one episode of emesis. In ED, patient was noted to have lactic acidosis 2.1, creatinine 1.2, mild transaminitis. She has noticed left-sided pain in the last 2 weeks, intermittent.  Hospital Course:  Escherichia coli bacteremia / SIRS likely due to Escherichia coli UTI, chest x-ray was negative for pneumonia, patient presented with lactic acidosis, fevers, tachycardia Patient was admitted to stepdown unit. C. difficile was negative influenza panel was negative. Urine culture showed Escherichia coli, blood cultures positive  for Escherichia coli as well likely from the UTI. Patient was placed on IV cefepime during hospitalization, per sensitivities transitioned to oral Keflex for another 12 days to complete the full course. CT abdomen and pelvis was negative for any intra-abdominal pathology/pyelonephritis.  pro-calcitonin was elevated at 14.2, improving to 9.8 on 2/1.   Hypothyroidism - TSH 13.58, Synthroid increased to 88 MCG daily    Hyperlipidemia -Statins were held during the hospitalization due to transaminitis at the time of admission possibly due to shock liver and sepsis. Patient can restart statins in 3 days. AST has normalized ALT is slightly elevated at 42.  Transaminitis - Elevated liver enzymes possibly due to sepsis, improving, patient will continue to hold statins for another 3 days.   GERD - Continue PPI  Acute kidney injury: Likely prerenal secondary to sepsis and UTI , creatinine was 1.2 at the time of admission. Patient is now eating well, tolerating diet, creatinine has normalized to 0.9.   Hyperglycemia: Likely due to acute illness - Hemoglobin A1c 5.6   Patient was evaluated by physical therapy, she is now ambulating without any difficulty, does not need any PT follow-up.  Day of Discharge BP 114/61 mmHg  Pulse 90  Temp(Src) 97.8 F (36.6 C) (Oral)  Resp 17  Ht 5\' 4"  (1.626 m)  Wt 79.017 kg (174 lb 3.2 oz)  BMI 29.89 kg/m2  SpO2 96%  Physical Exam: General: Alert and awake oriented x3 not in any acute distress. CVS: S1-S2 clear no murmur rubs or gallops Chest: clear to auscultation bilaterally, no wheezing rales or rhonchi Abdomen: soft nontender, nondistended, normal bowel sounds Extremities: no cyanosis, clubbing or edema noted bilaterally Neuro: Cranial nerves II-XII intact, no focal neurological deficits   The results of significant diagnostics from this hospitalization (including imaging, microbiology, ancillary and laboratory) are listed below for reference.     LAB RESULTS: Basic Metabolic Panel:  Recent Labs Lab 04/05/14 0315 04/06/14 0339  NA 138 139  K 3.9 4.2  CL 111 106  CO2 23 29  GLUCOSE 108* 99  BUN 9 <5*  CREATININE 0.94 0.90  CALCIUM 8.4 8.5   Liver Function Tests:  Recent Labs Lab 04/05/14 0315 04/06/14 0339  AST 39* 33  ALT 52* 42*  ALKPHOS 111 123*  BILITOT 0.8 0.4  PROT 5.5* 5.6*  ALBUMIN 2.6* 2.8*   No results for input(s): LIPASE, AMYLASE in the last 168 hours. No results for input(s): AMMONIA in the last 168 hours. CBC:  Recent Labs Lab 04/05/14 0315 04/06/14 0339  WBC 10.6* 7.8  HGB 11.3* 11.2*  HCT 34.9* 34.1*  MCV 95.1 94.7  PLT 139* 173   Cardiac Enzymes: No results for input(s): CKTOTAL, CKMB, CKMBINDEX, TROPONINI in  the last 168 hours. BNP: Invalid input(s): POCBNP CBG: No results for input(s): GLUCAP in the last 168 hours.  Significant Diagnostic Studies:  Dg Chest 2 View  04/04/2014   CLINICAL DATA:  Cough since yesterday with admission for UTI yesterday. UY:QIHKVQ Ca  EXAM: CHEST - 2 VIEW  COMPARISON:  the previous day's study  FINDINGS: New patchy airspace opacities in the basal segments of the left lower lobe. There is a smaller focal airspace density also noted in the right lower lung the tract slow. Small pleural effusions have developed, left greater than right. Heart size remains normal. Atheromatous aorta.  Visualized skeletal structures are unremarkable. Residual oral Contrast noted in the visualized upper abdomen.  IMPRESSION: 1. New bibasilar airspace disease left greater than right, with small pleural effusions.   Electronically Signed   By: Oley Balm M.D.   On: 04/04/2014 09:31   Ct Abdomen Pelvis W Contrast  04/03/2014   CLINICAL DATA:  Diarrhea and left-sided abdominal pain for 1 day.  EXAM: CT ABDOMEN AND PELVIS WITH CONTRAST  TECHNIQUE: Multidetector CT imaging of the abdomen and pelvis was performed using the standard protocol following bolus administration of  intravenous contrast.  CONTRAST:  80mL OMNIPAQUE IOHEXOL 300 MG/ML  SOLN  COMPARISON:  10/29/2012  FINDINGS: Benign right hepatic lobe lesions are again evident without significant change from 10/29/2012. The largest measures 19 mm in the right hepatic lobe dome. No new or suspicious liver lesions are evident. There is no bile duct dilatation. Multiple small calculi are present in the gallbladder lumen.  The pancreas, spleen, adrenals and kidneys appear normal.  There are several para-aortic retroperitoneal nodes which are mildly prominent in number although not pathologic by size. There also are a few nodes in the root of the small bowel mesentery. These nodes are not significantly changed from 10/29/2012.  The stomach, small bowel and colon are unremarkable in appearance. There is no bowel obstruction. There is no focal inflammatory change. The appendix is normal.  Uterus and adnexal structures appear unremarkable.  There is no ascites. There is no free intraperitoneal air. There is no hernia. No significant abnormalities are evident in the lower chest.  IMPRESSION: There are a few periaortic and mesenteric root nodes which are prominent in number although not pathologic by size. These are indeterminate but are unchanged from 10/29/2012.  No acute findings are evident in the abdomen or pelvis.  Cholelithiasis   Electronically Signed   By: Ellery Plunk M.D.   On: 04/03/2014 23:15   Dg Chest Port 1 View  04/03/2014   CLINICAL DATA:  Cough and chills  EXAM: PORTABLE CHEST - 1 VIEW  COMPARISON:  February 25, 2014  FINDINGS: There is no edema or consolidation. Heart is upper normal in size with pulmonary vascularity within normal limits. There is atherosclerotic change in aorta. No adenopathy. No bone lesions. Slight elevation the right hemidiaphragm is stable.  IMPRESSION: No edema or consolidation.   Electronically Signed   By: Bretta Bang M.D.   On: 04/03/2014 16:07       Disposition and  Follow-up: Discharge Instructions    Diet - low sodium heart healthy    Complete by:  As directed      Increase activity slowly    Complete by:  As directed             DISPOSITION: Home   DISCHARGE FOLLOW-UP Follow-up Information    Follow up with Roxy Manns, MD. Schedule an appointment as soon  as possible for a visit in 10 days.   Specialties:  Family Medicine, Radiology   Why:  for hospital follow-up   Contact information:   [address] [place] [address] [place] [place] [address] [phone]        Time spent on Discharge: 35 minutes  Signed:   RAI,RIPUDEEP M.D. [facility] 04/06/2014, 9:33 AM Pager: [phone]

## 2014-04-06 NOTE — Care Management Note (Signed)
    Page 1 of 1   04/06/2014     4:54:12 PM CARE MANAGEMENT NOTE 04/06/2014  Patient:  Frances Hoffman, Frances Hoffman   Account Number:  [id number]  Date Initiated:  04/06/2014  Documentation initiated by:  Kelty Szafran  Subjective/Objective Assessment:   Pt adm on 04/03/14 with sepsis.  PTA, pt independent, lives with spouse.     Action/Plan:   Pt for dc home today; no home needs identified.   Anticipated DC Date:  04/06/2014   Anticipated DC Plan:  [place]  CM consult      Choice offered to / List presented to:             Status of service:  Completed, signed off Medicare Important Message given?  YES (If response is "NO", the following Medicare IM given date fields will be blank) Date Medicare IM given:  04/06/2014 Medicare IM given by:  Laurene Melendrez Date Additional Medicare IM given:   Additional Medicare IM given by:    Discharge Disposition:  HOME/SELF CARE  Per UR Regulation:  Reviewed for med. necessity/level of care/duration of stay  If discussed at [place] of Stay Meetings, dates discussed:    Comments:

## 2014-04-08 ENCOUNTER — Encounter: Payer: Self-pay | Admitting: Internal Medicine

## 2014-04-08 ENCOUNTER — Ambulatory Visit: Payer: Self-pay | Admitting: Family Medicine

## 2014-04-08 ENCOUNTER — Telehealth: Payer: Self-pay | Admitting: Family Medicine

## 2014-04-08 ENCOUNTER — Ambulatory Visit (INDEPENDENT_AMBULATORY_CARE_PROVIDER_SITE_OTHER): Payer: Medicare HMO | Admitting: Internal Medicine

## 2014-04-08 VITALS — BP 142/72 | HR 68 | Temp 98.4°F | Ht 64.0 in | Wt 167.1 lb

## 2014-04-08 DIAGNOSIS — F4323 Adjustment disorder with mixed anxiety and depressed mood: Secondary | ICD-10-CM

## 2014-04-08 DIAGNOSIS — R300 Dysuria: Secondary | ICD-10-CM

## 2014-04-08 DIAGNOSIS — A4151 Sepsis due to Escherichia coli [E. coli]: Secondary | ICD-10-CM

## 2014-04-08 LAB — POCT URINALYSIS DIPSTICK
Bilirubin, UA: NEGATIVE
Blood, UA: NEGATIVE
Glucose, UA: NEGATIVE
Ketones, UA: NEGATIVE
LEUKOCYTES UA: NEGATIVE
NITRITE UA: NEGATIVE
Protein, UA: NEGATIVE
Spec Grav, UA: 1.015
UROBILINOGEN UA: 2
pH, UA: 7

## 2014-04-08 MED ORDER — ESCITALOPRAM OXALATE 10 MG PO TABS: 10.0000 mg | ORAL_TABLET | Freq: Every day | ORAL | Status: DC

## 2014-04-08 MED ORDER — CEFTRIAXONE SODIUM 1 G IJ SOLR
1.0000 g | Freq: Once | INTRAMUSCULAR | Status: AC
  Administered 2014-04-08: 1 g via INTRAMUSCULAR

## 2014-04-08 NOTE — Telephone Encounter (Signed)
PLEASE NOTE: All timestamps contained within this report are represented as [place] Standard Time. CONFIDENTIALTY NOTICE: This fax transmission is intended only for the addressee. It contains information that is legally privileged, confidential or otherwise protected from use or disclosure. If you are not the intended recipient, you are strictly prohibited from reviewing, disclosing, copying using or disseminating any of this information or taking any action in reliance on or regarding this information. If you have received this fax in error, please notify us immediately by telephone so that we can arrange for its return to [place]. Phone: [phone], Toll-Free: [phone], Fax: [phone] Page: 1 of 1 Call Id: [id number] [place] Patient Name: Frances Hoffman DOB: 10/08/1940 Initial Comment [name] states wife was just discharged from the hospital Tuesday for a severe UTI, is running a fever Nurse Assessment Nurse: Erlene Quan, RN, Manuela Schwartz Date/Time Eilene Ghazi Time): 04/08/2014 12:58:24 PM Confirm and document reason for call. If symptomatic, describe symptoms. ---[name] states wife was just discharged from the hospital Tuesday for a severe UTI - is running a fever - its 97.7 at home but when it was 97.9 at home it was 103 when they arrived at ER Saturday - she is very hot to touch so husband is not trusting the oral temp - she is on an antibiotic at home and was on them in hospital , she is having a hard time with liquids and not feeling well at all - she is still urinating and she has seen no blood in her urine Has the patient traveled out of the country within the last 30 days? ---Not Applicable Does the patient require triage? ---Yes Related visit to physician within the last 2 weeks? ---Yes Does the PT have any chronic conditions? (i.e. diabetes, asthma, etc.) ---No Guidelines Guideline Title  Affirmed Question Affirmed Notes Urinary Tract Infection on Antibiotic Follow-up Call - Female [1] Taking antibiotic > 24 hours for UTI (urinary tract or bladder infection) AND [2] fever persists Final Disposition User See Physician within [address] Hours [place], Therapist, sports, Manuela Schwartz

## 2014-04-08 NOTE — Assessment & Plan Note (Signed)
E coli from UTI Now starting to have systemic symptoms again Not clear if parenteral therapy may have controlled symptoms and oral isn't? Will give rocephin today See tomorrow and decide about further Rx

## 2014-04-08 NOTE — Addendum Note (Signed)
Addended by: Jacqualin Combes on: 04/08/2014 05:09 PM   Modules accepted: Orders

## 2014-04-08 NOTE — Progress Notes (Signed)
Pre visit review using our clinic review tool, if applicable. No additional management support is needed unless otherwise documented below in the visit note. 

## 2014-04-08 NOTE — Telephone Encounter (Signed)
Pt already has appt with Dr Silvio Pate today at 4 pm.

## 2014-04-08 NOTE — Assessment & Plan Note (Addendum)
Off the escitalopram for about a week--not sure if a withdrawal reaction from this could be part of what is going on They will restart this tonight---in case that is part of what is causing the clinical picture--at least at half dose

## 2014-04-08 NOTE — Progress Notes (Signed)
Subjective:    Patient ID: Frances Hoffman, female    DOB: [birth year], 74 y.o.   MRN: [id number]   HPI Here with husband  "I feel so bad" Feels feverish--even if not showing on thermometer Doesn't feel good No appetite since hospital Nausea Head feels "terrible"  Felt better leaving hospital 2 days ago--and okay yesterday Today is when it really got bad No dysuria or hematuria No chills---had these bad when first going to the hospital  Current Outpatient Prescriptions on File Prior to Visit  Medication Sig Dispense Refill  . acetaminophen (TYLENOL) 500 MG tablet Take 1,000 mg by mouth every 8 (eight) hours as needed for fever (pain).    Melene Muller ON 04/09/2014] atorvastatin (LIPITOR) 20 MG tablet Take 1 tablet (20 mg total) by mouth daily. 30 tablet 3  . carboxymethylcellulose 1 % ophthalmic solution Apply 1 drop to eye 3 (three) times daily as needed (for dry eyes).    . cephALEXin (KEFLEX) 500 MG capsule Take 2 capsules (1,000 mg total) by mouth 3 (three) times daily. X 12 more days 36 capsule 0  . Cholecalciferol (VITAMIN D3) 2000 UNITS capsule Take 2,000 Units by mouth daily.      . clonazePAM (KLONOPIN) 0.5 MG tablet Take 0.5 tablets (0.25 mg total) by mouth daily as needed (anxiety, depression). 30 tablet 0  . levothyroxine (SYNTHROID, LEVOTHROID) 88 MCG tablet Take 1 tablet (88 mcg total) by mouth daily before breakfast. 30 tablet 3  . meclizine (ANTIVERT) 25 MG tablet One pill by mouth at bedtime as needed for dizziness 30 tablet 0  . Multiple Vitamin (MULTIVITAMIN) tablet Take 1 tablet by mouth daily.      . Omega-3 Fatty Acids (FISH OIL PO) Take 1 capsule by mouth daily.    [name] omeprazole (PRILOSEC OTC) 20 MG tablet Take 20 mg by mouth daily.    . traMADol (ULTRAM) 50 MG tablet Take 1 tablet (50 mg total) by mouth every 12 (twelve) hours as needed for severe pain. 30 tablet 0  . vitamin C (ASCORBIC ACID) 500 MG tablet Take 500 mg by mouth daily.    . [DISCONTINUED]  Diphenhydramine-APAP, sleep, (TYLENOL PM EXTRA STRENGTH PO) Take by mouth as needed.       No current facility-administered medications on file prior to visit.    Allergies  Allergen Reactions  . Ciprofloxacin Other (See Comments)    Unk  . Citalopram Hydrobromide     Intrusive/ weird thoughts     Past Medical History  Diagnosis Date  . Cancer 2000    breast had lumpectomy/radiation x36,mammo ,no chemo  . Depression   . GERD (gastroesophageal reflux disease)   . Hyperlipidemia   . Hypothyroid   . Frozen shoulder   . Osteopenia     BMD 2004, WNL 2008  . Shingles     chronic body pain, left side of body  . Dysuria-frequency syndrome     takes AZO  . Zoster     Hx of  . Hypothyroid     Past Surgical History  Procedure Laterality Date  . Breast surgery  2000    lumpectomy    Family History  Problem Relation Age of Onset  . Osteoporosis Mother   . Parkinsonism Father   . Cancer Sister     breast CA  . Cancer Other     breast CA  . Heart disease Maternal Grandmother   . Heart disease Maternal Grandfather   . Coronary artery disease Sister  History   Social History  . Marital Status: Married    Spouse Name: N/A    Number of Children: N/A  . Years of Education: N/A   Occupational History  . Not on file.   Social History Main Topics  . Smoking status: Never Smoker   . Smokeless tobacco: Never Used  . Alcohol Use: No  . Drug Use: No  . Sexual Activity: Not on file   Other Topics Concern  . Not on file   Social History Narrative   Review of Systems Some cough in hospital--slight yesterday at home No SOB No rash Ran out of escitalopram about a week ago--- denied by the insurance    Objective:   Physical Exam  Constitutional:  Looks mildly ill  Neck: Normal range of motion. No thyromegaly present.  Cardiovascular: Normal rate, regular rhythm and normal heart sounds.  Exam reveals no gallop.   No murmur heard. Pulmonary/Chest: No  respiratory distress. She has no wheezes. She has no rales.  Abdominal: Soft. She exhibits no distension. There is no tenderness. There is no rebound and no guarding.  Lymphadenopathy:    She has no cervical adenopathy.  Skin:  Cool and clammy  Psychiatric: She has a normal mood and affect. Her behavior is normal.          Assessment & Plan:

## 2014-04-08 NOTE — Patient Instructions (Signed)
Skip the cephalexin tonight but restart it tomorrow. Restart the escitalopram tonight with just the 10mg  pill.

## 2014-04-09 ENCOUNTER — Encounter: Payer: Self-pay | Admitting: Internal Medicine

## 2014-04-09 ENCOUNTER — Ambulatory Visit (INDEPENDENT_AMBULATORY_CARE_PROVIDER_SITE_OTHER): Payer: Medicare HMO | Admitting: Internal Medicine

## 2014-04-09 VITALS — BP 134/80 | HR 98 | Temp 98.0°F | Wt 168.2 lb

## 2014-04-09 DIAGNOSIS — A4151 Sepsis due to Escherichia coli [E. coli]: Secondary | ICD-10-CM

## 2014-04-09 MED ORDER — FLUCONAZOLE 100 MG PO TABS: 100.0000 mg | ORAL_TABLET | Freq: Every day | ORAL | Status: DC

## 2014-04-09 MED ORDER — CEFTRIAXONE SODIUM 1 G IJ SOLR
1.0000 g | Freq: Once | INTRAMUSCULAR | Status: AC
  Administered 2014-04-09: 1 g via INTRAMUSCULAR

## 2014-04-09 NOTE — Progress Notes (Signed)
Pre visit review using our clinic review tool, if applicable. No additional management support is needed unless otherwise documented below in the visit note. 

## 2014-04-09 NOTE — Progress Notes (Signed)
Subjective:    Patient ID: Frances Hoffman, female    DOB: [birth year], 74 y.o.   MRN: [id number]  HPI Follow up from yesterday  Has had AM headaches since she has been sick Using tylenol or aleve with some help Did restart the escitalopram last night  No fever or chills No N/V  Current Outpatient Prescriptions on File Prior to Visit  Medication Sig Dispense Refill  . acetaminophen (TYLENOL) 500 MG tablet Take 1,000 mg by mouth every 8 (eight) hours as needed for fever (pain).    [name] atorvastatin (LIPITOR) 20 MG tablet Take 1 tablet (20 mg total) by mouth daily. 30 tablet 3  . carboxymethylcellulose 1 % ophthalmic solution Apply 1 drop to eye 3 (three) times daily as needed (for dry eyes).    . cephALEXin (KEFLEX) 500 MG capsule Take 2 capsules (1,000 mg total) by mouth 3 (three) times daily. X 12 more days 36 capsule 0  . Cholecalciferol (VITAMIN D3) 2000 UNITS capsule Take 2,000 Units by mouth daily.      . clonazePAM (KLONOPIN) 0.5 MG tablet Take 0.5 tablets (0.25 mg total) by mouth daily as needed (anxiety, depression). 30 tablet 0  . escitalopram (LEXAPRO) 10 MG tablet Take 1 tablet (10 mg total) by mouth daily. 30 tablet 0  . levothyroxine (SYNTHROID, LEVOTHROID) 88 MCG tablet Take 1 tablet (88 mcg total) by mouth daily before breakfast. 30 tablet 3  . meclizine (ANTIVERT) 25 MG tablet One pill by mouth at bedtime as needed for dizziness 30 tablet 0  . Multiple Vitamin (MULTIVITAMIN) tablet Take 1 tablet by mouth daily.      . Omega-3 Fatty Acids (FISH OIL PO) Take 1 capsule by mouth daily.    [name] omeprazole (PRILOSEC OTC) 20 MG tablet Take 20 mg by mouth daily.    . traMADol (ULTRAM) 50 MG tablet Take 1 tablet (50 mg total) by mouth every 12 (twelve) hours as needed for severe pain. 30 tablet 0  . vitamin C (ASCORBIC ACID) 500 MG tablet Take 500 mg by mouth daily.    . [DISCONTINUED] Diphenhydramine-APAP, sleep, (TYLENOL PM EXTRA STRENGTH PO) Take by mouth as needed.       No  current facility-administered medications on file prior to visit.    Allergies  Allergen Reactions  . Ciprofloxacin Other (See Comments)    Unk  . Citalopram Hydrobromide     Intrusive/ weird thoughts     Past Medical History  Diagnosis Date  . Cancer 2000    breast had lumpectomy/radiation x36,mammo ,no chemo  . Depression   . GERD (gastroesophageal reflux disease)   . Hyperlipidemia   . Hypothyroid   . Frozen shoulder   . Osteopenia     BMD 2004, WNL 2008  . Shingles     chronic body pain, left side of body  . Dysuria-frequency syndrome     takes AZO  . Zoster     Hx of  . Hypothyroid     Past Surgical History  Procedure Laterality Date  . Breast surgery  2000    lumpectomy    Family History  Problem Relation Age of Onset  . Osteoporosis Mother   . Parkinsonism Father   . Cancer Sister     breast CA  . Cancer Other     breast CA  . Heart disease Maternal Grandmother   . Heart disease Maternal Grandfather   . Coronary artery disease Sister     History   Social  History  . Marital Status: Married    Spouse Name: N/A    Number of Children: N/A  . Years of Education: N/A   Occupational History  . Not on file.   Social History Main Topics  . Smoking status: Never Smoker   . Smokeless tobacco: Never Used  . Alcohol Use: No  . Drug Use: No  . Sexual Activity: Not on file   Other Topics Concern  . Not on file   Social History Narrative   Review of Systems No dysuria or hematuria Still no appetite---but some better since yesterday (gets sensation of [place] with eating) No white spots in mouth    Objective:   Physical Exam  Constitutional: She appears well-developed and well-nourished. No distress.  HENT:  Slight thrush on tongue  Neck: Normal range of motion. Neck supple. No thyromegaly present.  Cardiovascular: Normal rate, regular rhythm and normal heart sounds.  Exam reveals no gallop.   No murmur heard. Pulmonary/Chest: Effort  normal and breath sounds normal. No respiratory distress. She has no wheezes. She has no rales.  Abdominal: Soft. There is no tenderness.  Musculoskeletal: She exhibits no edema or tenderness.  Lymphadenopathy:    She has no cervical adenopathy.  Skin: Skin is warm and dry.  Psychiatric: She has a normal mood and affect. Her behavior is normal.          Assessment & Plan:

## 2014-04-09 NOTE — Assessment & Plan Note (Signed)
Looks better today Will give rocephin and she will continue the cephalexin at home Go to [facility] tomorrow if doesn't awaken better by AM Fluconazole for the thrush

## 2014-04-09 NOTE — Addendum Note (Signed)
Addended by: Modena Nunnery on: 04/09/2014 11:25 AM   Modules accepted: Orders

## 2014-04-10 LAB — CULTURE, BLOOD (ROUTINE X 2): CULTURE: NO GROWTH

## 2014-04-12 ENCOUNTER — Ambulatory Visit: Payer: Medicare HMO | Admitting: Family Medicine

## 2014-04-13 ENCOUNTER — Telehealth: Payer: Self-pay | Admitting: Family Medicine

## 2014-04-13 ENCOUNTER — Ambulatory Visit: Payer: Medicare HMO | Admitting: Family Medicine

## 2014-04-13 NOTE — Telephone Encounter (Signed)
For symptoms of a cold - I recommend mucinex (plain or the DM type if she is coughing) Fluids/ nasal saline spray  Rest  Acetaminophen if feverish  Keep me updated or follow up if worse  The diarrhea should subside when done with the antibiotic- but if not please let me know

## 2014-04-13 NOTE — Telephone Encounter (Signed)
Notified patient. Inform her of Dr Marliss Coots comments. Patient verbalized understanding. Will updated if no improvement

## 2014-04-13 NOTE — Telephone Encounter (Signed)
Pt called stating she saw dr Silvio Pate 2/5 and she stated she feels like she is getting a cold and wanted to know what she needed to do for this.  She said she is taking cephalexin 500mg .  She said she thinks this is giving  Her diarrhea.  She had thrush and they gave her fluconazole 100mg  7 tablets  The med is making her mouth sore.  She said she just don't feel good and wanted to know what to do.   Offered to make pt appointment she just wanted a phone call telling her what she can do for her cold

## 2014-04-17 ENCOUNTER — Ambulatory Visit (INDEPENDENT_AMBULATORY_CARE_PROVIDER_SITE_OTHER): Payer: Medicare HMO | Admitting: Family Medicine

## 2014-04-17 ENCOUNTER — Encounter: Payer: Self-pay | Admitting: Family Medicine

## 2014-04-17 ENCOUNTER — Telehealth: Payer: Self-pay | Admitting: Family Medicine

## 2014-04-17 VITALS — BP 118/72 | HR 74 | Temp 98.4°F | Wt 162.0 lb

## 2014-04-17 DIAGNOSIS — K529 Noninfective gastroenteritis and colitis, unspecified: Secondary | ICD-10-CM

## 2014-04-17 NOTE — Telephone Encounter (Signed)
[place] Patient Name: Frances Hoffman DOB: [birth year] Initial Comment [name] states had UTI that got into blood stream and fluid in lungs; took last pill fouconazole 100 mg 7 days on Tues and still has diarrhea; cephalexin 500 for 2tabs 3x per day for 6 days; had thrush; d/c Tues Feb 2, Thurs saw [place] who said sent home too early and gave a shot; Fri had to get another shot in same office; Nurse Assessment Nurse: Erlene Quan, RN, Manuela Schwartz Date/Time ([place] Time): 04/17/2014 9:31:06 AM Confirm and document reason for call. If symptomatic, describe symptoms. ---[name] states had UTI that got into blood stream and fluid in lungs; took last pill fouconazole 100 mg 7 days on Tues and still has diarrhea; cephalexin 500 for 2 tabs 3 x per day for 6 days; had thrush; d/c Tues Feb 2, Thurs saw [place] who said sent home too early and gave a shot; Fri had to get another shot in same office; she still has diarrhea and it is severe - 6-7 episodes already this morning feels very sick Has the patient traveled out of the country within the last 30 days? ---Not Applicable Does the patient require triage? ---Yes Related visit to physician within the last 2 weeks? ---Yes Does the PT have any chronic conditions? (i.e. diabetes, asthma, etc.) ---Yes Guidelines Guideline Title Affirmed Question Affirmed Notes Diarrhea [1] Age > 60 years AND [2] > 6 diarrhea stools in past 24 hours Final Disposition User See Physician within 4 Hours (or PCP triage) Erlene Quan, RN, Manuela Schwartz Comments Pt scheduled appointment [facility] for today 04/17/14 at 11:45 am

## 2014-04-17 NOTE — Progress Notes (Signed)
Subjective:    Patient ID: Frances Hoffman, female    DOB: Jul 12, 1940, 74 y.o.   MRN: [id number]  HPI   74 year old female pt with recent hospitalization for uro sepis of Dr. Milinda Antis presents with severe diarrhea  Admission 1/30 to 2/2: DX: Ecoli sepsis chest x-ray was negative for pneumonia, patient presented with lactic acidosis, fevers, tachycardia Patient was admitted to stepdown unit. C. difficile was negative influenza panel was negative. Urine culture showed Escherichia coli, blood cultures positive for Escherichia coli as well likely from the UTI. Patient was placed on IV cefepime during hospitalization, per sensitivities transitioned to oral Keflex for another 12 days to complete the full course. CT abdomen and pelvis was negative for any intra-abdominal pathology/pyelonephritis. pro-calcitonin was elevated at 14.2, improving to 9.8 on 2/1.  Saw Dr. Alphonsus Sias in follow up on 2/4 for  Possible recurrence of symptoms.Marland KitchenMarland KitchenGave IM rocephin  2/5  Given second rocephin  Given fluconazole for thrush.   Per pt following that she felt better some. No further  Dysuria, better energy. Feeling better overall.  She has had some diarrhea ever since taking  Keflex  (last dose 3 days ago) . Worse in last 2 days. More liquidy/watery.  Using pepto bismol which helps some.  no fever. No abdominal pain. Some blood in stool from hemorrhoids. Trying to eat and drink liquids. No N/V. Review of Systems  Constitutional: Negative for fever and fatigue.  HENT: Negative for ear pain.   Eyes: Negative for pain.  Respiratory: Negative for chest tightness and shortness of breath.   Cardiovascular: Negative for chest pain, palpitations and leg swelling.  Gastrointestinal: Negative for abdominal pain.  Genitourinary: Negative for dysuria.    [id number]     Objective:   Physical Exam  Constitutional: Vital signs are normal. She appears well-developed and well-nourished. She is cooperative.  Non-toxic  appearance. She does not appear ill. No distress.  HENT:  Head: Normocephalic.  Right Ear: Hearing, tympanic membrane, external ear and ear canal normal. Tympanic membrane is not erythematous, not retracted and not bulging.  Left Ear: Hearing, tympanic membrane, external ear and ear canal normal. Tympanic membrane is not erythematous, not retracted and not bulging.  Nose: No mucosal edema or rhinorrhea. Right sinus exhibits no maxillary sinus tenderness and no frontal sinus tenderness. Left sinus exhibits no maxillary sinus tenderness and no frontal sinus tenderness.  Mouth/Throat: Uvula is midline, oropharynx is clear and moist and mucous membranes are normal.  Eyes: Conjunctivae, EOM and lids are normal. Pupils are equal, round, and reactive to light. Lids are everted and swept, no foreign bodies found.  Neck: Trachea normal and normal range of motion. Neck supple. Carotid bruit is not present. No thyroid mass and no thyromegaly present.  Cardiovascular: Normal rate, regular rhythm, S1 normal, S2 normal, normal heart sounds, intact distal pulses and normal pulses.  Exam reveals no gallop and no friction rub.   No murmur heard. Pulmonary/Chest: Effort normal and breath sounds normal. No tachypnea. No respiratory distress. She has no decreased breath sounds. She has no wheezes. She has no rhonchi. She has no rales.  Abdominal: Soft. Normal appearance and bowel sounds are normal. There is no tenderness.  Neurological: She is alert.  Skin: Skin is warm, dry and intact. No rash noted.  Psychiatric: Her speech is normal and behavior is normal. Judgment and thought content normal. Her mood appears not anxious. Cognition and memory are normal. She does not exhibit a depressed mood.  Assessment & Plan:

## 2014-04-17 NOTE — Assessment & Plan Note (Signed)
Pt comfortable and nontoxic in appearance. May be improving some today. MAy be secondary to recent antibiotics and change in bacterial flora. Push fluids. Given recent hospitalization will check C diffilicle. Send for stool culture if able.  Follow up with PCP next week if not improving.

## 2014-04-17 NOTE — Progress Notes (Signed)
Pre visit review using our clinic review tool, if applicable. No additional management support is needed unless otherwise documented below in the visit note. 

## 2014-04-17 NOTE — Patient Instructions (Signed)
Push fluids.  Rest. Go to [facility] for c.difficle test and possibly stool culture.  If fever, abdominal pain go to ER.  If not improving .[name] Follow up next week with PCP.

## 2014-04-19 ENCOUNTER — Telehealth: Payer: Self-pay | Admitting: *Deleted

## 2014-04-19 DIAGNOSIS — E039 Hypothyroidism, unspecified: Secondary | ICD-10-CM

## 2014-04-19 DIAGNOSIS — Z0000 Encounter for general adult medical examination without abnormal findings: Secondary | ICD-10-CM

## 2014-04-19 DIAGNOSIS — M8580 Other specified disorders of bone density and structure, unspecified site: Secondary | ICD-10-CM

## 2014-04-19 DIAGNOSIS — K529 Noninfective gastroenteritis and colitis, unspecified: Secondary | ICD-10-CM

## 2014-04-19 NOTE — Telephone Encounter (Signed)
Patient called back and change the appt to Tues 04/20/14.  Terri added in the comments for the c diff.

## 2014-04-19 NOTE — Telephone Encounter (Signed)
Pt called stating she saw dr Frances Hoffman on Saturday 2/13  Dr Frances Hoffman sent her the er cone.  She was calling to see what her test were and wanted advise  on what to do about her diarrhea    [phone]   [facility]

## 2014-04-19 NOTE — Telephone Encounter (Signed)
It looks like she was sent to [facility] lab to get stool tests ? I do not see an ER note and I do not see any test results yet - please ask pt if this is correct and ask her how she is feeling  If she did go to [facility] lab-please check on the status of those stool tests- thanks

## 2014-04-19 NOTE — Telephone Encounter (Signed)
Patient has a lab appt on Wed 04/21/14 for cpe labs. Terri added in the comments for the c diff.  I have already notified patient of the comments.

## 2014-04-19 NOTE — Telephone Encounter (Signed)
Labs were ordered and she moved up her appt to Tues  Please let me know when you get her c diff sample going so I can send in flagyl-thanks

## 2014-04-19 NOTE — Telephone Encounter (Signed)
Spoken to patient and ask her regarding Dr Marliss Coots comments. Patient stated she went to [facility] but there is know stool tests in the system. Patient stated she just want relief from the diarrhea. She goes 4 to 5 times a day. She wants to know what can she take to help this. No black stool or anything.

## 2014-04-19 NOTE — Telephone Encounter (Signed)
I will go ahead and order future labs incl c diff

## 2014-04-19 NOTE — Telephone Encounter (Signed)
We need to find out what is causing it before advising treatment ((I rev Dr Rometta Emery note) I am suspicious of C difficile diarrhea (you can get this from antibiotics and also from being in the hospital)- she fits both criteria I need to get a stool sample from her to send for c diff- once we get this I will likely get her started on tx for it even before result returns  Please make a lab appt for this - send back to me and I will order the test , once she has given the sample I can send px to the pharmacy Please make sure to drink fluids to prevent dehydration

## 2014-04-20 ENCOUNTER — Other Ambulatory Visit (INDEPENDENT_AMBULATORY_CARE_PROVIDER_SITE_OTHER): Payer: Medicare HMO

## 2014-04-20 DIAGNOSIS — E039 Hypothyroidism, unspecified: Secondary | ICD-10-CM

## 2014-04-20 DIAGNOSIS — M8580 Other specified disorders of bone density and structure, unspecified site: Secondary | ICD-10-CM

## 2014-04-20 DIAGNOSIS — K529 Noninfective gastroenteritis and colitis, unspecified: Secondary | ICD-10-CM

## 2014-04-20 LAB — COMPREHENSIVE METABOLIC PANEL
ALBUMIN: 3.9 g/dL (ref 3.5–5.2)
ALT: 24 U/L (ref 0–35)
AST: 25 U/L (ref 0–37)
Alkaline Phosphatase: 122 U/L — ABNORMAL HIGH (ref 39–117)
BUN: 13 mg/dL (ref 6–23)
CALCIUM: 10.2 mg/dL (ref 8.4–10.5)
CHLORIDE: 101 meq/L (ref 96–112)
CO2: 29 mEq/L (ref 19–32)
Creatinine, Ser: 1.01 mg/dL (ref 0.40–1.20)
GFR: 57.01 mL/min — AB (ref >60.00)
Glucose, Bld: 85 mg/dL (ref 70–99)
POTASSIUM: 4.4 meq/L (ref 3.5–5.1)
SODIUM: 137 meq/L (ref 135–145)
TOTAL PROTEIN: 7.5 g/dL (ref 6.0–8.3)
Total Bilirubin: 0.6 mg/dL (ref 0.2–1.2)

## 2014-04-20 LAB — LDL CHOLESTEROL, DIRECT: LDL DIRECT: 92 mg/dL

## 2014-04-20 LAB — CBC WITH DIFFERENTIAL/PLATELET
BASOS PCT: 0.5 % (ref 0.0–3.0)
Basophils Absolute: 0 10*3/uL (ref 0.0–0.1)
EOS ABS: 0.1 10*3/uL (ref 0.0–0.7)
EOS PCT: 0.7 % (ref 0.0–5.0)
HEMATOCRIT: 41.7 % (ref 36.0–46.0)
Hemoglobin: 14.1 g/dL (ref 12.0–15.0)
Lymphocytes Relative: 25.4 % (ref 12.0–46.0)
Lymphs Abs: 2.1 10*3/uL (ref 0.7–4.0)
MCHC: 33.8 g/dL (ref 30.0–36.0)
MCV: 92.2 fl (ref 78.0–100.0)
Monocytes Absolute: 0.7 10*3/uL (ref 0.1–1.0)
Monocytes Relative: 8.8 % (ref 3.0–12.0)
Neutro Abs: 5.2 10*3/uL (ref 1.4–7.7)
Neutrophils Relative %: 64.6 % (ref 43.0–77.0)
PLATELETS: 401 10*3/uL — AB (ref 150.0–400.0)
RBC: 4.52 Mil/uL (ref 3.87–5.11)
RDW: 14.5 % (ref 11.5–15.5)
WBC: 8.1 10*3/uL (ref 4.0–10.5)

## 2014-04-20 LAB — LIPID PANEL
Cholesterol: 181 mg/dL (ref 0–200)
HDL: 36.5 mg/dL — ABNORMAL LOW (ref >39.00)
NONHDL: 144.5
Total CHOL/HDL Ratio: 5
Triglycerides: 250 mg/dL — ABNORMAL HIGH (ref 0.0–149.0)
VLDL: 50 mg/dL — ABNORMAL HIGH (ref 0.0–40.0)

## 2014-04-20 LAB — TSH: TSH: 2.43 u[IU]/mL (ref 0.35–4.50)

## 2014-04-20 LAB — VITAMIN D 25 HYDROXY (VIT D DEFICIENCY, FRACTURES): VITD: 79.61 ng/mL (ref 30.00–100.00)

## 2014-04-20 MED ORDER — METRONIDAZOLE 500 MG PO TABS: 500.0000 mg | ORAL_TABLET | Freq: Three times a day (TID) | ORAL | Status: DC

## 2014-04-20 NOTE — Addendum Note (Signed)
Addended by: Loura Pardon A on: 04/20/2014 04:19 PM   Modules accepted: Orders

## 2014-04-20 NOTE — Telephone Encounter (Signed)
Patient has dropped off her stool for c diff

## 2014-04-20 NOTE — Telephone Encounter (Signed)
Called in the rx to [place]. Called patient and notified her of Dr Marliss Coots comments.

## 2014-04-20 NOTE — Telephone Encounter (Signed)
Tell her it is 3 times daily for 10 days and avoid alcohol on this  Can take am/ midday and bedtime  Thanks  Will alert her when result returns

## 2014-04-20 NOTE — Telephone Encounter (Signed)
Thanks - please call flagyl into her pharmacy

## 2014-04-21 ENCOUNTER — Other Ambulatory Visit: Payer: Medicare HMO

## 2014-04-21 LAB — C. DIFFICILE GDH AND TOXIN A/B
C. difficile GDH: NOT DETECTED
C. difficile Toxin A/B: NOT DETECTED
Source: 0

## 2014-04-23 ENCOUNTER — Telehealth: Payer: Self-pay | Admitting: Family Medicine

## 2014-04-23 NOTE — Telephone Encounter (Signed)
Ok- I will see her on Wed as planned - please list flagyl on med intolerance list with her reaction /side effects Thanks for letting me know

## 2014-04-23 NOTE — Telephone Encounter (Signed)
Pt called and said dr tower called her in Metronidazole 500mg .  Pt stated she has taken these for the last 2 days.  She stated they make her feel bad and she is not going to take any more of them

## 2014-04-26 NOTE — Telephone Encounter (Signed)
Flagyl has been noted in allergies.

## 2014-04-28 ENCOUNTER — Encounter: Payer: Self-pay | Admitting: Family Medicine

## 2014-04-28 ENCOUNTER — Ambulatory Visit (INDEPENDENT_AMBULATORY_CARE_PROVIDER_SITE_OTHER): Payer: Medicare HMO | Admitting: Family Medicine

## 2014-04-28 ENCOUNTER — Telehealth: Payer: Self-pay | Admitting: Family Medicine

## 2014-04-28 VITALS — BP 120/76 | HR 64 | Temp 97.7°F | Ht 64.0 in | Wt 165.4 lb

## 2014-04-28 DIAGNOSIS — Z23 Encounter for immunization: Secondary | ICD-10-CM

## 2014-04-28 DIAGNOSIS — E039 Hypothyroidism, unspecified: Secondary | ICD-10-CM

## 2014-04-28 DIAGNOSIS — R195 Other fecal abnormalities: Secondary | ICD-10-CM

## 2014-04-28 DIAGNOSIS — E2839 Other primary ovarian failure: Secondary | ICD-10-CM | POA: Insufficient documentation

## 2014-04-28 DIAGNOSIS — E785 Hyperlipidemia, unspecified: Secondary | ICD-10-CM

## 2014-04-28 DIAGNOSIS — Z0000 Encounter for general adult medical examination without abnormal findings: Secondary | ICD-10-CM

## 2014-04-28 DIAGNOSIS — M8580 Other specified disorders of bone density and structure, unspecified site: Secondary | ICD-10-CM

## 2014-04-28 MED ORDER — ESCITALOPRAM OXALATE 20 MG PO TABS: 20.0000 mg | ORAL_TABLET | Freq: Every day | ORAL | Status: DC

## 2014-04-28 MED ORDER — LEVOTHYROXINE SODIUM 88 MCG PO TABS: 88.0000 ug | ORAL_TABLET | Freq: Every day | ORAL | Status: DC

## 2014-04-28 NOTE — Telephone Encounter (Signed)
I did referral -please tell her she will get a call Thanks

## 2014-04-28 NOTE — Patient Instructions (Signed)
For loose stools - get Align over the counter and take for at least 2 weeks - if symmptoms continue let me know  prevnar vaccine today  Go up on lexapro to 20 mg daily   Take care of yourself and make sure to get enough rest

## 2014-04-28 NOTE — Assessment & Plan Note (Signed)
Reviewed health habits including diet and exercise and skin cancer prevention Reviewed appropriate screening tests for age  Also reviewed health mt list, fam hx and immunization status , as well as social and family history   See HPI prevnar vaccine today Will rev adv directive materials in case she wants to modify her existing one  Recovering from recent sepsis

## 2014-04-28 NOTE — Assessment & Plan Note (Signed)
Pt pt -no longer severe diarrhea C diff neg Had prolonged abx ? Bact overgrowth  Will try Align otc for at least 2 wk If no imp - update -consider GI f/u

## 2014-04-28 NOTE — Assessment & Plan Note (Signed)
Hypothyroidism  Pt has no clinical changes No change in energy level/ hair or skin/ edema and no tremor Lab Results  Component Value Date   TSH 2.43 04/20/2014    Dose was inc to 88 mcg in the hospital-doing well

## 2014-04-28 NOTE — Progress Notes (Signed)
Subjective:    Patient ID: Frances Hoffman, Frances    DOB: 05/28/1940, 74 y.o.   MRN: [id number]  HPI Here for annual medicare wellness visit as well as chronic/acute medical problems   Wt is up 3 lb with bmi of28  Recent hosp with e coli sepsis and tx with cephalexin and rocephin   (from uti)- that she did not even know she had  Diflucan for thrush Then developed diarrhea= soft stingy stool (not runny) , not improved at all  No explosive diarrhea or blood in her stool  She is not taking any probiotics  c diff neg - tried flagyl and did not tolerate it (she felt bad, headache and fatigue)   She is still tired/ some headaches and her low abd and side hurts a bit  Overall much better than she was     I have personally reviewed the Medicare Annual Wellness questionnaire and have noted 1. The patient's medical and social history 2. Their use of alcohol, tobacco or illicit drugs 3. Their current medications and supplements 4. The patient's functional ability including ADL's, fall risks, home safety risks and hearing or visual             impairment. 5. Diet and physical activities 6. Evidence for depression or mood disorders  The patients weight, height, BMI have been recorded in the chart and visual acuity is per eye clinic.  I have made referrals, counseling and provided education to the patient based review of the above and I have provided the pt with a written personalized care plan for preventive services.  See scanned forms.  Routine anticipatory guidance given to patient.  See health maintenance. Colon cancer screening 8/10 -10 year recall  Breast cancer screening 12/15 nl  Self breast exam - does not check herself  Flu vaccine -[facility] in [facility] fall at [facility]  Tetanus vaccine 1/08 Pneumovax 9/10 - wants prevnar today  Zoster vaccine 2/15   Advance directive - she has POA and living will - she thinks so / took materials to look at just in case  Cognitive function addressed-  see scanned forms- and if abnormal then additional documentation follows.  occ forgets a word/small things/not concerning   PMH and SH reviewed  Meds, vitals, and allergies reviewed.   ROS: See HPI.  Otherwise negative.    Osteopenia D level 79 good dexa 10/13 stable - no falls  Wants to put off her dexa   Hyperlipidemia Statin and diet Lab Results  Component Value Date   CHOL 181 04/20/2014   CHOL 184 09/03/2013   CHOL 187 02/12/2013   Lab Results  Component Value Date   HDL 36.50* 04/20/2014   HDL 42 09/03/2013   HDL 44.70 02/12/2013   Lab Results  Component Value Date   LDLCALC 116* 09/03/2013   LDLCALC 119* 02/12/2013   LDLCALC 123* 01/21/2012   Lab Results  Component Value Date   TRIG 250.0* 04/20/2014   TRIG 128 09/03/2013   TRIG 115.0 02/12/2013   Lab Results  Component Value Date   CHOLHDL 5 04/20/2014   CHOLHDL 4.4 09/03/2013   CHOLHDL 4 02/12/2013   Lab Results  Component Value Date   LDLDIRECT 92.0 04/20/2014   LDLDIRECT 151.8 03/15/2011   LDLDIRECT 138.5 05/05/2010  was recently very sick/ missed meds and no exercise   On lexapro for mood (she had tried to come off of it)- insurance no longer pays for it  But she pays  for it out of pocket  Thinks she would like to go back to the 20 mg    Results for orders placed or performed in visit on 04/20/14  CBC with Differential/Platelet  Result Value Ref Range   WBC 8.1 4.0 - 10.5 K/uL   RBC 4.52 3.87 - 5.11 Mil/uL   Hemoglobin 14.1 12.0 - 15.0 g/dL   HCT 40.9 81.1 - 91.4 %   MCV 92.2 78.0 - 100.0 fl   MCHC 33.8 30.0 - 36.0 g/dL   RDW 78.2 95.6 - 21.3 %   Platelets 401.0 (H) 150.0 - 400.0 K/uL   Neutrophils Relative % 64.6 43.0 - 77.0 %   Lymphocytes Relative 25.4 12.0 - 46.0 %   Monocytes Relative 8.8 3.0 - 12.0 %   Eosinophils Relative 0.7 0.0 - 5.0 %   Basophils Relative 0.5 0.0 - 3.0 %   Neutro Abs 5.2 1.4 - 7.7 K/uL   Lymphs Abs 2.1 0.7 - 4.0 K/uL   Monocytes Absolute 0.7 0.1 - 1.0  K/uL   Eosinophils Absolute 0.1 0.0 - 0.7 K/uL   Basophils Absolute 0.0 0.0 - 0.1 K/uL  Comprehensive metabolic panel  Result Value Ref Range   Sodium 137 135 - 145 mEq/L   Potassium 4.4 3.5 - 5.1 mEq/L   Chloride 101 96 - 112 mEq/L   CO2 29 19 - 32 mEq/L   Glucose, Bld 85 70 - 99 mg/dL   BUN 13 6 - 23 mg/dL   Creatinine, Ser 0.86 0.40 - 1.20 mg/dL   Total Bilirubin 0.6 0.2 - 1.2 mg/dL   Alkaline Phosphatase 122 (H) 39 - 117 U/L   AST 25 0 - 37 U/L   ALT 24 0 - 35 U/L   Total Protein 7.5 6.0 - 8.3 g/dL   Albumin 3.9 3.5 - 5.2 g/dL   Calcium 57.8 8.4 - 46.9 mg/dL   GFR 62.95 (L) >28.41 mL/min  Lipid panel  Result Value Ref Range   Cholesterol 181 0 - 200 mg/dL   Triglycerides 324.4 (H) 0.0 - 149.0 mg/dL   HDL 01.02 (L) >72.53 mg/dL   VLDL 66.4 (H) 0.0 - 40.3 mg/dL   Total CHOL/HDL Ratio 5    NonHDL 144.50   TSH  Result Value Ref Range   TSH 2.43 0.35 - 4.50 uIU/mL  Vit D  25 hydroxy (rtn osteoporosis monitoring)  Result Value Ref Range   VITD 79.61 30.00 - 100.00 ng/mL  C. difficile GDH and Toxin A/B  Result Value Ref Range   C. difficile GDH Not Detected    C. difficile Toxin A/B Not Detected    Source .   LDL cholesterol, direct  Result Value Ref Range   Direct LDL 92.0 mg/dL    Went up on her thyroid med at hospital - to 71  Doing well with that     Patient Active Problem List   Diagnosis Date Noted  . Loose stools 04/28/2014  . Severe diarrhea 04/17/2014  . Sepsis 04/03/2014  . UTI (urinary tract infection) 04/03/2014  . Tachycardia 04/03/2014  . Chest discomfort 03/02/2014  . Exertional dyspnea 08/27/2013  . Family history of heart disease 08/27/2013  . Dyspnea on exertion 08/18/2013  . Left arm pain 08/18/2013  . Positional vertigo 08/18/2013  . Abdominal pain, left lower quadrant 09/09/2012  . Abnormal x-ray of neck 01/11/2012  . Encounter for Medicare annual wellness exam 11/23/2011  . Other screening mammogram 11/23/2011  . Insomnia 01/16/2011    . DRY  MOUTH 08/15/2009  . Osteopenia 11/26/2008  . NEOPLASM, MALIGNANT, BREAST, HX OF 07/01/2008  . UTI'S, RECURRENT 01/15/2008  . WEIGHT GAIN 01/15/2008  . Hypothyroidism 12/12/2007  . Hyperlipidemia 12/12/2007  . Adjustment disorder with mixed anxiety and depressed mood 12/12/2007  . GERD 12/12/2007  . ADHESIVE CAPSULITIS 12/12/2007   Past Medical History  Diagnosis Date  . Cancer 2000    breast had lumpectomy/radiation x36,mammo ,no chemo  . Depression   . GERD (gastroesophageal reflux disease)   . Hyperlipidemia   . Hypothyroid   . Frozen shoulder   . Osteopenia     BMD 2004, WNL 2008  . Shingles     chronic body pain, left side of body  . Dysuria-frequency syndrome     takes AZO  . Zoster     Hx of  . Hypothyroid    Past Surgical History  Procedure Laterality Date  . Breast surgery  2000    lumpectomy   History  Substance Use Topics  . Smoking status: Never Smoker   . Smokeless tobacco: Never Used  . Alcohol Use: No   Family History  Problem Relation Age of Onset  . Osteoporosis Mother   . Parkinsonism Father   . Cancer Sister     breast CA  . Cancer Other     breast CA  . Heart disease Maternal Grandmother   . Heart disease Maternal Grandfather   . Coronary artery disease Sister    Allergies  Allergen Reactions  . Ciprofloxacin Other (See Comments)    Unk  . Citalopram Hydrobromide     Intrusive/ weird thoughts   . Flagyl [Metronidazole]    Current Outpatient Prescriptions on File Prior to Visit  Medication Sig Dispense Refill  . acetaminophen (TYLENOL) 500 MG tablet Take 1,000 mg by mouth every 8 (eight) hours as needed for fever (pain).    [name] atorvastatin (LIPITOR) 20 MG tablet Take 1 tablet (20 mg total) by mouth daily. 30 tablet 3  . carboxymethylcellulose 1 % ophthalmic solution Apply 1 drop to eye 3 (three) times daily as needed (for dry eyes).    . Cholecalciferol (VITAMIN D3) 2000 UNITS capsule Take 2,000 Units by mouth daily.      .  meclizine (ANTIVERT) 25 MG tablet One pill by mouth at bedtime as needed for dizziness 30 tablet 0  . Multiple Vitamin (MULTIVITAMIN) tablet Take 1 tablet by mouth daily.      . Omega-3 Fatty Acids (FISH OIL PO) Take 1 capsule by mouth daily.    [name] omeprazole (PRILOSEC OTC) 20 MG tablet Take 20 mg by mouth daily.    . traMADol (ULTRAM) 50 MG tablet Take 1 tablet (50 mg total) by mouth every 12 (twelve) hours as needed for severe pain. 30 tablet 0  . vitamin C (ASCORBIC ACID) 500 MG tablet Take 500 mg by mouth daily.    . [DISCONTINUED] Diphenhydramine-APAP, sleep, (TYLENOL PM EXTRA STRENGTH PO) Take by mouth as needed.       No current facility-administered medications on file prior to visit.    Review of Systems Review of Systems  Constitutional: Negative for fever, appetite change,  and unexpected weight change. pos for pm fatigue that is improving  Eyes: Negative for pain and visual disturbance.  Respiratory: Negative for cough and shortness of breath.   Cardiovascular: Negative for cp or palpitations    Gastrointestinal: Negative for nausea,  and constipation. pos for loose stool, neg for blood in stool, pos for vague L  sided abd tingling and soreness in area of prev shingles  Genitourinary: Negative for urgency and frequency. neg for dysuria or blood in urine or flank pain  Skin: Negative for pallor or rash   Neurological: Negative for weakness, light-headedness, numbness and headaches.  Hematological: Negative for adenopathy. Does not bruise/bleed easily.  Psychiatric/Behavioral: Negative for dysphoric mood. The patient is not nervous/anxious.         Objective:   Physical Exam  Constitutional: She appears well-developed and well-nourished. No distress.  overwt and well appearing  HENT:  Head: Normocephalic and atraumatic.  Right Ear: External ear normal.  Left Ear: External ear normal.  Nose: Nose normal.  Mouth/Throat: Oropharynx is clear and moist.  Eyes: Conjunctivae and  EOM are normal. Pupils are equal, round, and reactive to light. Right eye exhibits no discharge. Left eye exhibits no discharge. No scleral icterus.  Neck: Normal range of motion. Neck supple. No JVD present. Carotid bruit is not present. No thyromegaly present.  Cardiovascular: Normal rate, regular rhythm, normal heart sounds and intact distal pulses.  Exam reveals no gallop.   Pulmonary/Chest: Effort normal and breath sounds normal. No respiratory distress. She has no wheezes. She has no rales.  Abdominal: Soft. Bowel sounds are normal. She exhibits no distension and no mass. There is no tenderness.  No abdominal tenderness   Genitourinary:  Breast exam: No mass, nodules, thickening, tenderness, bulging, retraction, inflamation, nipple discharge or skin changes noted.  No axillary or clavicular LA.    Baseline scar on L breast   Musculoskeletal: She exhibits no edema or tenderness.  Lymphadenopathy:    She has no cervical adenopathy.  Neurological: She is alert. She has normal reflexes. No cranial nerve deficit. She exhibits normal muscle tone. Coordination normal.  Skin: Skin is warm and dry. No rash noted. No erythema. No pallor.  Psychiatric: She has a normal mood and affect.          Assessment & Plan:   Problem List Items Addressed This Visit      Endocrine   Hypothyroidism    Hypothyroidism  Pt has no clinical changes No change in energy level/ hair or skin/ edema and no tremor Lab Results  Component Value Date   TSH 2.43 04/20/2014    Dose was inc to 88 mcg in the hospital-doing well       Relevant Medications   levothyroxine (SYNTHROID, LEVOTHROID) tablet     Musculoskeletal and Integument   Osteopenia    dexa 2013- she does not want to schedule another one yet Disc need for calcium/ vitamin D/ wt bearing exercise and bone density test every 2 y to monitor Disc safety/ fracture risk in detail  Vit D level good at 85        Other   Encounter for Medicare  annual wellness exam - Primary    Reviewed health habits including diet and exercise and skin cancer prevention Reviewed appropriate screening tests for age  Also reviewed health mt list, fam hx and immunization status , as well as social and family history   See HPI prevnar vaccine today Will rev adv directive materials in case she wants to modify her existing one  Recovering from recent sepsis       Hyperlipidemia    Disc goals for lipids and reasons to control them Rev labs with pt Rev low sat fat diet in detail  Up a bit s/p illness with lack of exercise-she is getting back on track now  Loose stools    Pt pt -no longer severe diarrhea C diff neg Had prolonged abx ? Bact overgrowth  Will try Align otc for at least 2 wk If no imp - update -consider GI f/u

## 2014-04-28 NOTE — Assessment & Plan Note (Signed)
dexa 2013- she does not want to schedule another one yet Disc need for calcium/ vitamin D/ wt bearing exercise and bone density test every 2 y to monitor Disc safety/ fracture risk in detail  Vit D level good at 79

## 2014-04-28 NOTE — Telephone Encounter (Signed)
Pt called stating she saw dr tower today and dr tower wanted her to have bone density.  Pt stated she didn't want to have it but changed her mind and would like to get this now.   Pt would like am appointment  She can to any day

## 2014-04-28 NOTE — Progress Notes (Signed)
Pre visit review using our clinic review tool, if applicable. No additional management support is needed unless otherwise documented below in the visit note. 

## 2014-04-28 NOTE — Assessment & Plan Note (Signed)
Disc goals for lipids and reasons to control them Rev labs with pt Rev low sat fat diet in detail  Up a bit s/p illness with lack of exercise-she is getting back on track now

## 2014-04-29 NOTE — Telephone Encounter (Signed)
Left detailed message on voicemail.  

## 2014-05-03 ENCOUNTER — Ambulatory Visit (INDEPENDENT_AMBULATORY_CARE_PROVIDER_SITE_OTHER): Payer: Medicare HMO | Admitting: Family Medicine

## 2014-05-03 ENCOUNTER — Encounter: Payer: Self-pay | Admitting: Family Medicine

## 2014-05-03 VITALS — BP 120/72 | HR 83 | Temp 97.8°F | Ht 64.0 in | Wt 163.8 lb

## 2014-05-03 DIAGNOSIS — R300 Dysuria: Secondary | ICD-10-CM

## 2014-05-03 DIAGNOSIS — N390 Urinary tract infection, site not specified: Secondary | ICD-10-CM

## 2014-05-03 LAB — POCT URINALYSIS DIPSTICK
BILIRUBIN UA: NEGATIVE
Blood, UA: NEGATIVE
GLUCOSE UA: NEGATIVE
Ketones, UA: NEGATIVE
NITRITE UA: NEGATIVE
Protein, UA: NEGATIVE
SPEC GRAV UA: 1.015
UROBILINOGEN UA: 0.2
pH, UA: 7

## 2014-05-03 MED ORDER — SULFAMETHOXAZOLE-TRIMETHOPRIM 800-160 MG PO TABS: 1.0000 | ORAL_TABLET | Freq: Two times a day (BID) | ORAL | Status: DC

## 2014-05-03 NOTE — Progress Notes (Signed)
Pre visit review using our clinic review tool, if applicable. No additional management support is needed unless otherwise documented below in the visit note. 

## 2014-05-03 NOTE — Progress Notes (Signed)
Subjective:    Patient ID: Frances Hoffman, female    DOB: 21-Jun-1940, 74 y.o.   MRN: [id number]  HPI Here with uti symptoms   2 days Taking azo  She called urology as well -cannot get in until mid march  Some pain over bladder area and some burning to urinate  Frequency and urgency  No blood in urine   Has been seen by urol in the past - and no cause found particularly per pt      Chemistry      Component Value Date/Time   NA 137 04/20/2014 0930   K 4.4 04/20/2014 0930   CL 101 04/20/2014 0930   CO2 29 04/20/2014 0930   BUN 13 04/20/2014 0930   CREATININE 1.01 04/20/2014 0930   CREATININE 0.82 09/03/2013 0805      Component Value Date/Time   CALCIUM 10.2 04/20/2014 0930   ALKPHOS 122* 04/20/2014 0930   AST 25 04/20/2014 0930   ALT 24 04/20/2014 0930   BILITOT 0.6 04/20/2014 0930      Results for orders placed or performed in visit on 05/03/14  POCT urinalysis dipstick  Result Value Ref Range   Color, UA yellow    Clarity, UA cloudy    Glucose, UA neg    Bilirubin, UA neg    Ketones, UA neg    Spec Grav, UA 1.015    Blood, UA neg    pH, UA 7.0    Protein, UA neg    Urobilinogen, UA 0.2    Nitrite, UA neg    Leukocytes, UA large (3+)     Patient Active Problem List   Diagnosis Date Noted  . Loose stools 04/28/2014  . Estrogen deficiency 04/28/2014  . Severe diarrhea 04/17/2014  . UTI (urinary tract infection) 04/03/2014  . Tachycardia 04/03/2014  . Exertional dyspnea 08/27/2013  . Family history of heart disease 08/27/2013  . Dyspnea on exertion 08/18/2013  . Positional vertigo 08/18/2013  . Abdominal pain, left lower quadrant 09/09/2012  . Abnormal x-ray of neck 01/11/2012  . Encounter for Medicare annual wellness exam 11/23/2011  . Other screening mammogram 11/23/2011  . Insomnia 01/16/2011  . DRY MOUTH 08/15/2009  . Osteopenia 11/26/2008  . NEOPLASM, MALIGNANT, BREAST, HX OF 07/01/2008  . UTI'S, RECURRENT 01/15/2008  . WEIGHT GAIN  01/15/2008  . Hypothyroidism 12/12/2007  . Hyperlipidemia 12/12/2007  . Adjustment disorder with mixed anxiety and depressed mood 12/12/2007  . GERD 12/12/2007  . ADHESIVE CAPSULITIS 12/12/2007   Past Medical History  Diagnosis Date  . Cancer 2000    breast had lumpectomy/radiation x36,mammo ,no chemo  . Depression   . GERD (gastroesophageal reflux disease)   . Hyperlipidemia   . Hypothyroid   . Frozen shoulder   . Osteopenia     BMD 2004, WNL 2008  . Shingles     chronic body pain, left side of body  . Dysuria-frequency syndrome     takes AZO  . Zoster     Hx of  . Hypothyroid    Past Surgical History  Procedure Laterality Date  . Breast surgery  2000    lumpectomy   History  Substance Use Topics  . Smoking status: Never Smoker   . Smokeless tobacco: Never Used  . Alcohol Use: No   Family History  Problem Relation Age of Onset  . Osteoporosis Mother   . Parkinsonism Father   . Cancer Sister     breast CA  . Cancer Other  breast CA  . Heart disease Maternal Grandmother   . Heart disease Maternal Grandfather   . Coronary artery disease Sister    Allergies  Allergen Reactions  . Ciprofloxacin Other (See Comments)    Unk  . Citalopram Hydrobromide     Intrusive/ weird thoughts   . Flagyl [Metronidazole]    Current Outpatient Prescriptions on File Prior to Visit  Medication Sig Dispense Refill  . acetaminophen (TYLENOL) 500 MG tablet Take 1,000 mg by mouth every 8 (eight) hours as needed for fever (pain).    [name] atorvastatin (LIPITOR) 20 MG tablet Take 1 tablet (20 mg total) by mouth daily. 30 tablet 3  . carboxymethylcellulose 1 % ophthalmic solution Apply 1 drop to eye 3 (three) times daily as needed (for dry eyes).    . Cholecalciferol (VITAMIN D3) 2000 UNITS capsule Take 2,000 Units by mouth daily.      [name] escitalopram (LEXAPRO) 20 MG tablet Take 1 tablet (20 mg total) by mouth daily. 30 tablet 11  . levothyroxine (SYNTHROID, LEVOTHROID) 88 MCG tablet  Take 1 tablet (88 mcg total) by mouth daily before breakfast. 30 tablet 11  . meclizine (ANTIVERT) 25 MG tablet One pill by mouth at bedtime as needed for dizziness 30 tablet 0  . Multiple Vitamin (MULTIVITAMIN) tablet Take 1 tablet by mouth daily.      . Omega-3 Fatty Acids (FISH OIL PO) Take 1 capsule by mouth daily.    [name] omeprazole (PRILOSEC OTC) 20 MG tablet Take 20 mg by mouth daily.    . traMADol (ULTRAM) 50 MG tablet Take 1 tablet (50 mg total) by mouth every 12 (twelve) hours as needed for severe pain. 30 tablet 0  . vitamin C (ASCORBIC ACID) 500 MG tablet Take 500 mg by mouth daily.    . [DISCONTINUED] Diphenhydramine-APAP, sleep, (TYLENOL PM EXTRA STRENGTH PO) Take by mouth as needed.       No current facility-administered medications on file prior to visit.    Review of Systems Review of Systems  Constitutional: Negative for fever, appetite change, fatigue and unexpected weight change.  Eyes: Negative for pain and visual disturbance.  Respiratory: Negative for cough and shortness of breath.   Cardiovascular: Negative for cp or palpitations    Gastrointestinal: Negative for nausea, diarrhea and constipation.  Genitourinary: posfor urgency and frequency. neg for hematuria or flank pain  Skin: Negative for pallor or rash   Neurological: Negative for weakness, light-headedness, numbness and headaches.  Hematological: Negative for adenopathy. Does not bruise/bleed easily.  Psychiatric/Behavioral: Negative for dysphoric mood. The patient is not nervous/anxious.         Objective:   Physical Exam  Constitutional: She appears well-developed and well-nourished. No distress.  HENT:  Head: Normocephalic and atraumatic.  Mouth/Throat: Oropharynx is clear and moist.  Eyes: Conjunctivae and EOM are normal. Pupils are equal, round, and reactive to light. No scleral icterus.  Neck: Normal range of motion. Neck supple.  Cardiovascular: Normal rate and regular rhythm.   Pulmonary/Chest:  Effort normal and breath sounds normal.  Abdominal: Soft. Bowel sounds are normal. She exhibits no distension and no mass. There is tenderness. There is no rebound and no guarding.  Mild suprapubic tenderness No cva tenderness   Lymphadenopathy:    She has no cervical adenopathy.  Neurological: She is alert.  Skin: Skin is warm and dry. No rash noted.  Psychiatric: She has a normal mood and affect.          Assessment & Plan:  Problem List Items Addressed This Visit      Genitourinary   Recurrent UTI - Primary    Pos ua in setting of elderly female with hx of recurrent uti- and recent urosepsis Did finish keflex over a week ago  No source found of frequ uti- will return to urology Cover with bactrim Pend urine cx Update if not starting to improve in a week or if worsening   Enc water intake       Relevant Medications   sulfamethoxazole-trimethoprim (BACTRIM DS) tablet 800-160 mg   Other Relevant Orders   Urine culture    Other Visit Diagnoses    Burning with urination        Relevant Orders    POCT urinalysis dipstick (Completed)

## 2014-05-03 NOTE — Patient Instructions (Signed)
Drink lots of water  Take the bactrim as directed If symptoms worsen- let me know Get a follow up appt with your urologist We will get back to you with urine culture result when it returns

## 2014-05-03 NOTE — Assessment & Plan Note (Signed)
Pos ua in setting of elderly female with hx of recurrent uti- and recent urosepsis Did finish keflex over a week ago  No source found of frequ uti- will return to urology Cover with bactrim Pend urine cx Update if not starting to improve in a week or if worsening   Enc water intake

## 2014-05-05 LAB — URINE CULTURE

## 2014-05-06 ENCOUNTER — Telehealth: Payer: Self-pay | Admitting: *Deleted

## 2014-05-06 MED ORDER — ATORVASTATIN CALCIUM 20 MG PO TABS: 20.0000 mg | ORAL_TABLET | Freq: Every day | ORAL | Status: DC

## 2014-05-06 NOTE — Telephone Encounter (Signed)
Received faxed request to transfer medication to [facility] on [address]. rx sent to [facility].

## 2014-05-13 ENCOUNTER — Ambulatory Visit
Admission: RE | Admit: 2014-05-13 | Discharge: 2014-05-13 | Disposition: A | Discharge status: Home / Self Care | Payer: Medicare HMO | Source: Ambulatory Visit | Attending: Family Medicine | Admitting: Family Medicine

## 2014-05-13 DIAGNOSIS — E2839 Other primary ovarian failure: Secondary | ICD-10-CM

## 2014-05-17 ENCOUNTER — Telehealth: Payer: Self-pay

## 2014-05-17 NOTE — Telephone Encounter (Signed)
Patient aware of dexa scan results and recommendations. 

## 2014-05-17 NOTE — Telephone Encounter (Signed)
-----   Message from Abner Greenspan, MD sent at 05/16/2014 12:13 PM EDT ----- Osteopenia of the hip No change from last dexa  Take D (and ca if tolerated) Exercise /stay active  We can re check this in 2 y Please note for health mt

## 2014-07-30 ENCOUNTER — Ambulatory Visit (INDEPENDENT_AMBULATORY_CARE_PROVIDER_SITE_OTHER): Payer: Medicare HMO | Admitting: Family Medicine

## 2014-07-30 ENCOUNTER — Encounter: Payer: Self-pay | Admitting: Family Medicine

## 2014-07-30 VITALS — BP 134/84 | HR 71 | Temp 98.0°F | Ht 64.0 in | Wt 165.5 lb

## 2014-07-30 DIAGNOSIS — R1032 Left lower quadrant pain: Secondary | ICD-10-CM

## 2014-07-30 DIAGNOSIS — R102 Pelvic and perineal pain: Secondary | ICD-10-CM | POA: Diagnosis not present

## 2014-07-30 DIAGNOSIS — R6889 Other general symptoms and signs: Secondary | ICD-10-CM | POA: Diagnosis not present

## 2014-07-30 DIAGNOSIS — N390 Urinary tract infection, site not specified: Secondary | ICD-10-CM | POA: Diagnosis not present

## 2014-07-30 LAB — POCT URINALYSIS DIPSTICK
BILIRUBIN UA: NEGATIVE
Blood, UA: NEGATIVE
GLUCOSE UA: NEGATIVE
Nitrite, UA: NEGATIVE
SPEC GRAV UA: 1.02
UROBILINOGEN UA: 0.2
pH, UA: 6

## 2014-07-30 NOTE — Progress Notes (Signed)
Subjective:    Patient ID: Frances Hoffman, female    DOB: Mar 04, 1941, 74 y.o.   MRN: [id number]  HPI Here for several issues   Has a "place" on her L side (low abd)  At times it hurts  It has been tender to the the touch for years Now it is a little swollen  This is where she had shingles 40 years ago   Colonoscopy 2010  She has regular BMs - does not tend to be constipated  No blood in stool   No gyn symptoms No bleeding or vaginal discharge or itching     Left urine specimen today Results for orders placed or performed in visit on 07/30/14  POCT urinalysis dipstick  Result Value Ref Range   Color, UA Yellow    Clarity, UA Clear    Glucose, UA Neg.    Bilirubin, UA Neg.    Ketones, UA Trace    Spec Grav, UA 1.020    Blood, UA Neg.    pH, UA 6.0    Protein, UA Trace    Urobilinogen, UA 0.2    Nitrite, UA Neg.    Leukocytes, UA small (1+)      Gets tired more easily when working (also sweaty)  ? If age rel  Not sob  ? If her L arm hurts    occ gets a sharp fleeting pain in legs - ? Why    Re assuring CT scan from Jan 2016  EXAM: CT ABDOMEN AND PELVIS WITH CONTRAST  TECHNIQUE: Multidetector CT imaging of the abdomen and pelvis was performed using the standard protocol following bolus administration of intravenous contrast.  CONTRAST: 80mL OMNIPAQUE IOHEXOL 300 MG/ML SOLN  COMPARISON: 10/29/2012  FINDINGS: Benign right hepatic lobe lesions are again evident without significant change from 10/29/2012. The largest measures 19 mm in the right hepatic lobe dome. No new or suspicious liver lesions are evident. There is no bile duct dilatation. Multiple small calculi are present in the gallbladder lumen.  The pancreas, spleen, adrenals and kidneys appear normal.  There are several para-aortic retroperitoneal nodes which are mildly prominent in number although not pathologic by size. There also are a few nodes in the root of the small bowel  mesentery. These nodes are not significantly changed from 10/29/2012.  The stomach, small bowel and colon are unremarkable in appearance. There is no bowel obstruction. There is no focal inflammatory change. The appendix is normal.  Uterus and adnexal structures appear unremarkable.  There is no ascites. There is no free intraperitoneal air. There is no hernia. No significant abnormalities are evident in the lower chest.  IMPRESSION: There are a few periaortic and mesenteric root nodes which are prominent in number although not pathologic by size. These are indeterminate but are unchanged from 10/29/2012.  No acute findings are evident in the abdomen or pelvis.  Cholelithiasis  (this is not the area bothering her)   Wt is up 2 lb - obese/bothersome to her  She does exercise    Patient Active Problem List   Diagnosis Date Noted  . Exercise intolerance 07/30/2014  . Loose stools 04/28/2014  . Estrogen deficiency 04/28/2014  . Severe diarrhea 04/17/2014  . UTI (urinary tract infection) 04/03/2014  . Tachycardia 04/03/2014  . Exertional dyspnea 08/27/2013  . Family history of heart disease 08/27/2013  . Dyspnea on exertion 08/18/2013  . Positional vertigo 08/18/2013  . Abdominal pain, left lower quadrant 09/09/2012  . Abnormal x-ray of neck 01/11/2012  .  Encounter for Medicare annual wellness exam 11/23/2011  . Other screening mammogram 11/23/2011  . Insomnia 01/16/2011  . DRY MOUTH 08/15/2009  . Osteopenia 11/26/2008  . NEOPLASM, MALIGNANT, BREAST, HX OF 07/01/2008  . Recurrent UTI 01/15/2008  . WEIGHT GAIN 01/15/2008  . Hypothyroidism 12/12/2007  . Hyperlipidemia 12/12/2007  . Adjustment disorder with mixed anxiety and depressed mood 12/12/2007  . GERD 12/12/2007  . ADHESIVE CAPSULITIS 12/12/2007   Past Medical History  Diagnosis Date  . Cancer 2000    breast had lumpectomy/radiation x36,mammo ,no chemo  . Depression   . GERD (gastroesophageal reflux  disease)   . Hyperlipidemia   . Hypothyroid   . Frozen shoulder   . Osteopenia     BMD 2004, WNL 2008  . Shingles     chronic body pain, left side of body  . Dysuria-frequency syndrome     takes AZO  . Zoster     Hx of  . Hypothyroid    Past Surgical History  Procedure Laterality Date  . Breast surgery  2000    lumpectomy   History  Substance Use Topics  . Smoking status: Never Smoker   . Smokeless tobacco: Never Used  . Alcohol Use: No   Family History  Problem Relation Age of Onset  . Osteoporosis Mother   . Parkinsonism Father   . Cancer Sister     breast CA  . Cancer Other     breast CA  . Heart disease Maternal Grandmother   . Heart disease Maternal Grandfather   . Coronary artery disease Sister    Allergies  Allergen Reactions  . Ciprofloxacin Other (See Comments)    Unk  . Citalopram Hydrobromide     Intrusive/ weird thoughts   . Flagyl [Metronidazole]    Current Outpatient Prescriptions on File Prior to Visit  Medication Sig Dispense Refill  . acetaminophen (TYLENOL) 500 MG tablet Take 1,000 mg by mouth every 8 (eight) hours as needed for fever (pain).    [name] atorvastatin (LIPITOR) 20 MG tablet Take 1 tablet (20 mg total) by mouth daily. 30 tablet 2  . carboxymethylcellulose 1 % ophthalmic solution Apply 1 drop to eye 3 (three) times daily as needed (for dry eyes).    . Cholecalciferol (VITAMIN D3) 2000 UNITS capsule Take 2,000 Units by mouth daily.      [name] escitalopram (LEXAPRO) 20 MG tablet Take 1 tablet (20 mg total) by mouth daily. 30 tablet 11  . levothyroxine (SYNTHROID, LEVOTHROID) 88 MCG tablet Take 1 tablet (88 mcg total) by mouth daily before breakfast. 30 tablet 11  . meclizine (ANTIVERT) 25 MG tablet One pill by mouth at bedtime as needed for dizziness 30 tablet 0  . Multiple Vitamin (MULTIVITAMIN) tablet Take 1 tablet by mouth daily.      . Omega-3 Fatty Acids (FISH OIL PO) Take 1 capsule by mouth daily.    [name] omeprazole (PRILOSEC OTC) 20 MG  tablet Take 20 mg by mouth daily.    . traMADol (ULTRAM) 50 MG tablet Take 1 tablet (50 mg total) by mouth every 12 (twelve) hours as needed for severe pain. 30 tablet 0  . vitamin C (ASCORBIC ACID) 500 MG tablet Take 500 mg by mouth daily.    . [DISCONTINUED] Diphenhydramine-APAP, sleep, (TYLENOL PM EXTRA STRENGTH PO) Take by mouth as needed.       No current facility-administered medications on file prior to visit.    Review of Systems    Review of Systems  Constitutional:  Negative for fever, appetite change, fatigue and unexpected weight change.  Eyes: Negative for pain and visual disturbance.  Respiratory: Negative for cough and shortness of breath.   Cardiovascular: Negative for cp or palpitations   pos for sweating and fatigue with outdoor work  Gastrointestinal: Negative for nausea, diarrhea and constipation. pos for fullness in L lower abdomen at site of shingles many years ago  Genitourinary: Negative for urgency and frequency. pos for low abd discomfort now and then and hx of frequent utis  Skin: Negative for pallor or rash   Neurological: Negative for weakness, light-headedness, numbness and headaches.  Hematological: Negative for adenopathy. Does not bruise/bleed easily.  Psychiatric/Behavioral: Negative for dysphoric mood. The patient is not nervous/anxious.      Objective:   Physical Exam  Constitutional: She appears well-developed and well-nourished. No distress.  overwt and well appearing   HENT:  Head: Normocephalic and atraumatic.  Mouth/Throat: Oropharynx is clear and moist.  Eyes: Conjunctivae and EOM are normal. Pupils are equal, round, and reactive to light.  Neck: Normal range of motion. Neck supple.  Cardiovascular: Normal rate, regular rhythm and normal heart sounds.   Pulmonary/Chest: Effort normal and breath sounds normal.  Abdominal: Soft. Bowel sounds are normal. She exhibits no distension. There is no hepatosplenomegaly. There is tenderness in the  suprapubic area and left lower quadrant. There is no rigidity, no rebound, no guarding, no CVA tenderness, no tenderness at McBurney's point and negative Murphy's sign. Hernia confirmed negative in the right inguinal area and confirmed negative in the left inguinal area.  No cva tenderness  Mild suprapubic tenderness  Genitourinary: There is no rash, tenderness or lesion on the right labia. There is no rash, tenderness or lesion on the left labia. Right adnexum displays no mass, no tenderness and no fullness. Left adnexum displays no mass, no tenderness and no fullness. No tenderness or bleeding in the vagina.  No M or tenderness on bimanual exam     Musculoskeletal: She exhibits no edema.  Lymphadenopathy:    She has no cervical adenopathy.       Right: No inguinal adenopathy present.       Left: No inguinal adenopathy present.  Neurological: She is alert. She has normal reflexes.  Skin: Skin is warm and dry. No rash noted. No erythema.  Psychiatric: She has a normal mood and affect.          Assessment & Plan:   Problem List Items Addressed This Visit    Abdominal pain, left lower quadrant    Pt has occ tenderness/ discomfort and she thinks this side is more prominent than the other  Nothing remarkable on exam however  Rev her CT of abd pelvis in detail as well as her last colonosc No hernia or diverticular signs seen Pelvic exam WNL Puzzling  ua is bland - but will cx in light of hx of freq utis (sees urology)  Will continue to follow and watch for change       Exercise intolerance    Pt was sent to cardiology in Dec/Jan  - and had nl myoview/echo-very reassuring She states symptoms are not new  Rev studies with her  Oddly- happens with physical work but not when she "works out"  Will continue to follow  She has had some stressors Perhaps heat intolerance  Rev last labs as well       Recurrent UTI   Relevant Orders   Urine culture    Other Visit Diagnoses  Vaginal pain    -  Primary    Relevant Orders    POCT urinalysis dipstick (Completed)

## 2014-07-30 NOTE — Patient Instructions (Signed)
We will send urine for a culture and update you  If the area becomes more prominent or painful or rash develops -let me know We will watch this Last CT scan was reassuring   I am asking the staff to call for your last cardiology note

## 2014-07-30 NOTE — Assessment & Plan Note (Signed)
Pt has occ tenderness/ discomfort and she thinks this side is more prominent than the other  Nothing remarkable on exam however  Rev her CT of abd pelvis in detail as well as her last colonosc No hernia or diverticular signs seen Pelvic exam WNL Puzzling  ua is bland - but will cx in light of hx of freq utis (sees urology)  Will continue to follow and watch for change

## 2014-07-30 NOTE — Assessment & Plan Note (Signed)
Pt was sent to cardiology in Dec/Jan  - and had nl myoview/echo-very reassuring She states symptoms are not new  Rev studies with her  Oddly- happens with physical work but not when she "works out"  Will continue to follow  She has had some stressors Perhaps heat intolerance  Rev last labs as well

## 2014-07-30 NOTE — Progress Notes (Signed)
Pre visit review using our clinic review tool, if applicable. No additional management support is needed unless otherwise documented below in the visit note. 

## 2014-08-01 ENCOUNTER — Other Ambulatory Visit: Payer: Self-pay | Admitting: Family Medicine

## 2014-08-01 LAB — URINE CULTURE

## 2014-09-22 ENCOUNTER — Ambulatory Visit (INDEPENDENT_AMBULATORY_CARE_PROVIDER_SITE_OTHER): Payer: Medicare HMO | Admitting: Family Medicine

## 2014-09-22 ENCOUNTER — Encounter: Payer: Self-pay | Admitting: Family Medicine

## 2014-09-22 VITALS — BP 108/68 | HR 69 | Temp 98.0°F | Ht 64.0 in | Wt 165.5 lb

## 2014-09-22 DIAGNOSIS — R413 Other amnesia: Secondary | ICD-10-CM | POA: Insufficient documentation

## 2014-09-22 DIAGNOSIS — E039 Hypothyroidism, unspecified: Secondary | ICD-10-CM | POA: Diagnosis not present

## 2014-09-22 DIAGNOSIS — E785 Hyperlipidemia, unspecified: Secondary | ICD-10-CM

## 2014-09-22 HISTORY — DX: Other amnesia: R41.3

## 2014-09-22 LAB — COMPREHENSIVE METABOLIC PANEL
ALT: 25 U/L (ref 0–35)
AST: 27 U/L (ref 0–37)
Albumin: 3.9 g/dL (ref 3.5–5.2)
Alkaline Phosphatase: 104 U/L (ref 39–117)
BUN: 16 mg/dL (ref 6–23)
CHLORIDE: 102 meq/L (ref 96–112)
CO2: 31 mEq/L (ref 19–32)
CREATININE: 0.94 mg/dL (ref 0.40–1.20)
Calcium: 9.9 mg/dL (ref 8.4–10.5)
GFR: 61.87 mL/min (ref >60.00)
Glucose, Bld: 92 mg/dL (ref 70–99)
POTASSIUM: 4.2 meq/L (ref 3.5–5.1)
Sodium: 139 mEq/L (ref 135–145)
TOTAL PROTEIN: 6.7 g/dL (ref 6.0–8.3)
Total Bilirubin: 0.6 mg/dL (ref 0.2–1.2)

## 2014-09-22 LAB — VITAMIN B12: VITAMIN B 12: 713 pg/mL (ref 211–911)

## 2014-09-22 LAB — TSH: TSH: 0.08 u[IU]/mL — ABNORMAL LOW (ref 0.35–4.50)

## 2014-09-22 NOTE — Progress Notes (Signed)
Subjective:    Patient ID: Frances Hoffman, female    DOB: Jun 21, 1940, 74 y.o.   MRN: [id number]  HPI Here with c/o mental fuzziness   Wonders if it is from her cholesterol medication (read an article about this)   Going on for about a month  Nothing has changed medicine wise for about a month    Nothing has changed in her stress profile  Her brother is getting worse with parkinson's disease - at 22- that is always a stress but not bad  No other big changes  She tries to eat a healthy diet  Also some junk food  No caffeine  Does not smoke  No marijuana    Not depressed or feeling more depressed or anxious    Lab Results  Component Value Date   TSH 2.43 04/20/2014    Not taking either tramadol or meclizine right now    Her husband does the finances  She reads - occ has trouble getting into a book  Has to re read things  Family is not concerned   Patient Active Problem List   Diagnosis Date Noted  . Exercise intolerance 07/30/2014  . Loose stools 04/28/2014  . Estrogen deficiency 04/28/2014  . Severe diarrhea 04/17/2014  . UTI (urinary tract infection) 04/03/2014  . Tachycardia 04/03/2014  . Exertional dyspnea 08/27/2013  . Family history of heart disease 08/27/2013  . Dyspnea on exertion 08/18/2013  . Positional vertigo 08/18/2013  . Abdominal pain, left lower quadrant 09/09/2012  . Abnormal x-ray of neck 01/11/2012  . Encounter for Medicare annual wellness exam 11/23/2011  . Other screening mammogram 11/23/2011  . Insomnia 01/16/2011  . DRY MOUTH 08/15/2009  . Osteopenia 11/26/2008  . NEOPLASM, MALIGNANT, BREAST, HX OF 07/01/2008  . Recurrent UTI 01/15/2008  . WEIGHT GAIN 01/15/2008  . Hypothyroidism 12/12/2007  . Hyperlipidemia 12/12/2007  . Adjustment disorder with mixed anxiety and depressed mood 12/12/2007  . GERD 12/12/2007  . ADHESIVE CAPSULITIS 12/12/2007   Past Medical History  Diagnosis Date  . Cancer 2000    breast had  lumpectomy/radiation x36,mammo ,no chemo  . Depression   . GERD (gastroesophageal reflux disease)   . Hyperlipidemia   . Hypothyroid   . Frozen shoulder   . Osteopenia     BMD 2004, WNL 2008  . Shingles     chronic body pain, left side of body  . Dysuria-frequency syndrome     takes AZO  . Zoster     Hx of  . Hypothyroid    Past Surgical History  Procedure Laterality Date  . Breast surgery  2000    lumpectomy   History  Substance Use Topics  . Smoking status: Never Smoker   . Smokeless tobacco: Never Used  . Alcohol Use: No   Family History  Problem Relation Age of Onset  . Osteoporosis Mother   . Parkinsonism Father   . Cancer Sister     breast CA  . Cancer Other     breast CA  . Heart disease Maternal Grandmother   . Heart disease Maternal Grandfather   . Coronary artery disease Sister    Allergies  Allergen Reactions  . Ciprofloxacin Other (See Comments)    Unk  . Citalopram Hydrobromide     Intrusive/ weird thoughts   . Flagyl [Metronidazole]    Current Outpatient Prescriptions on File Prior to Visit  Medication Sig Dispense Refill  . acetaminophen (TYLENOL) 500 MG tablet Take 1,000 mg by mouth  every 8 (eight) hours as needed for fever (pain).    [name] atorvastatin (LIPITOR) 20 MG tablet TAKE ONE TABLET BY MOUTH ONCE DAILY 30 tablet 5  . carboxymethylcellulose 1 % ophthalmic solution Apply 1 drop to eye 3 (three) times daily as needed (for dry eyes).    . Cholecalciferol (VITAMIN D3) 2000 UNITS capsule Take 2,000 Units by mouth daily.      [name] escitalopram (LEXAPRO) 20 MG tablet Take 1 tablet (20 mg total) by mouth daily. 30 tablet 11  . levothyroxine (SYNTHROID, LEVOTHROID) 88 MCG tablet Take 1 tablet (88 mcg total) by mouth daily before breakfast. 30 tablet 11  . meclizine (ANTIVERT) 25 MG tablet One pill by mouth at bedtime as needed for dizziness 30 tablet 0  . Multiple Vitamin (MULTIVITAMIN) tablet Take 1 tablet by mouth daily.      . Omega-3 Fatty Acids  (FISH OIL PO) Take 1 capsule by mouth daily.    [name] omeprazole (PRILOSEC OTC) 20 MG tablet Take 20 mg by mouth daily.    . traMADol (ULTRAM) 50 MG tablet Take 1 tablet (50 mg total) by mouth every 12 (twelve) hours as needed for severe pain. 30 tablet 0  . vitamin C (ASCORBIC ACID) 500 MG tablet Take 500 mg by mouth daily.    . [DISCONTINUED] Diphenhydramine-APAP, sleep, (TYLENOL PM EXTRA STRENGTH PO) Take by mouth as needed.       No current facility-administered medications on file prior to visit.    Review of Systems Review of Systems  Constitutional: Negative for fever, appetite change, fatigue and unexpected weight change.  Eyes: Negative for pain and visual disturbance.  Respiratory: Negative for cough and shortness of breath.   Cardiovascular: Negative for cp or palpitations    Gastrointestinal: Negative for nausea, diarrhea and constipation.  Genitourinary: Negative for urgency and frequency.  Skin: Negative for pallor or rash   Neurological: Negative for weakness, light-headedness, numbness and headaches.  Hematological: Negative for adenopathy. Does not bruise/bleed easily.  Psychiatric/Behavioral: Negative for dysphoric mood. The patient is not nervous/anxious.  pos for mental fuzziness        Objective:   Physical Exam  Constitutional: She appears well-developed and well-nourished. No distress.  overwt and well appearing   HENT:  Head: Normocephalic and atraumatic.  Mouth/Throat: Oropharynx is clear and moist.  Eyes: Conjunctivae and EOM are normal. Pupils are equal, round, and reactive to light.  Neck: Normal range of motion. Neck supple. No JVD present. Carotid bruit is not present. No thyromegaly present.  Cardiovascular: Normal rate, regular rhythm, normal heart sounds and intact distal pulses.  Exam reveals no gallop.   Pulmonary/Chest: Effort normal and breath sounds normal. No respiratory distress. She has no wheezes. She has no rales.  No crackles  Abdominal:  Soft. Bowel sounds are normal. She exhibits no distension, no abdominal bruit and no mass. There is no tenderness.  Musculoskeletal: She exhibits no edema.  Lymphadenopathy:    She has no cervical adenopathy.  Neurological: She is alert. She has normal reflexes.  Skin: Skin is warm and dry. No rash noted.  Psychiatric: She has a normal mood and affect. Her behavior is normal. Thought content normal. Cognition and memory are normal.  Nl affect  Not confused            Assessment & Plan:   Problem List Items Addressed This Visit    Hyperlipidemia    Disc goals for lipids and reasons to control them Lab today  Rev  low sat fat diet in detail  On statin which she will hold 1 mo to see if it affects memory       Hypothyroidism - Primary    Pt has exp some memory/concentration issues Check TSH today      Relevant Orders   TSH (Completed)   Memory loss    Pt suffers from mental fuzziness/ slowness  Cognition and memory discussed Nl interview today  Will hold statin a mo to see if this is adding to symptoms  Also labs today  Declines depression or stressors currently      Relevant Orders   Comprehensive metabolic panel (Completed)   Vitamin B12 (Completed)

## 2014-09-22 NOTE — Progress Notes (Signed)
Pre visit review using our clinic review tool, if applicable. No additional management support is needed unless otherwise documented below in the visit note. 

## 2014-09-22 NOTE — Patient Instructions (Signed)
Keep your brain busy  Challenge yourself Read/do puzzles Stay social  Eat a healthy diet  Get exercise  Lab today Hold your atorvastatin for 1 month then let me know if your memory and cognition improves off of it

## 2014-09-23 NOTE — Assessment & Plan Note (Signed)
Pt has exp some memory/concentration issues Check TSH today

## 2014-09-23 NOTE — Assessment & Plan Note (Signed)
Pt suffers from mental fuzziness/ slowness  Cognition and memory discussed Nl interview today  Will hold statin a mo to see if this is adding to symptoms  Also labs today  Declines depression or stressors currently

## 2014-09-23 NOTE — Assessment & Plan Note (Signed)
Disc goals for lipids and reasons to control them Lab today  Rev low sat fat diet in detail  On statin which she will hold 1 mo to see if it affects memory

## 2014-09-24 ENCOUNTER — Telehealth: Payer: Self-pay | Admitting: *Deleted

## 2014-09-24 MED ORDER — LEVOTHYROXINE SODIUM 50 MCG PO TABS: 50.0000 ug | ORAL_TABLET | Freq: Every day | ORAL | Status: DC

## 2014-09-24 NOTE — Telephone Encounter (Signed)
Pt notified of labs and Dr. Marliss Coots comments/instructions. Pt is still taking 75mcg of synthroid so new Rx sent to pharmacy, and lab appt scheduled

## 2014-09-24 NOTE — Telephone Encounter (Signed)
-----   Message from Abner Greenspan, MD sent at 09/23/2014 12:37 PM EDT ----- tsh is low indicating too much levothyroxine Please verify that she is on 88 mcg daily  If so please change to 50, call in levothyroxine 50 mcg 1 po qd # 30 3 ref  Re check tsh in 6 wk  This could account for her memory and concentration problems  Other labs are ok

## 2014-10-07 ENCOUNTER — Telehealth: Payer: Self-pay | Admitting: Family Medicine

## 2014-10-07 NOTE — Telephone Encounter (Signed)
Pt has appt to see Dr Glori Bickers on 10/08/2014 at 10:15 AM.

## 2014-10-07 NOTE — Telephone Encounter (Signed)
Patient Name: [name] DOB: [birth year] Initial Comment [name] states she has thyroid problems. Needs to know why medication is not helping Nurse Assessment Nurse: Marcelline Deist, RN, Kermit Balo Date/Time (Eastern Time): 10/07/2014 9:54:39 AM Confirm and document reason for call. If symptomatic, describe symptoms. ---[name] states she has thyroid problems, thinks underactive, on generic Synthroid. Needs to know why medication is not helping. Was on 88 mcg dosage, then lowered to 50 mcg 2 weeks ago. Feels sluggish, especially in am. Also mentioned neck/shoulder area pain that sticks with her most of the time. Has the patient traveled out of the country within the last 30 days? ---No Does the patient require triage? ---Yes Related visit to physician within the last 2 weeks? ---No Does the PT have any chronic conditions? (i.e. diabetes, asthma, etc.) ---Yes List chronic conditions. ---thyroid issue, cholesterol rx Guidelines Guideline Title Affirmed Question Affirmed Notes Weakness (Generalized) and Fatigue [1] MODERATE weakness (i.e., interferes with work, school, normal activities) AND [2] persists > 3 days Final Disposition User See Physician within [place], Therapist, sports, [employer] states she is not sure if they would want her to be on a more mid range dosage, such as the 75 mcg dose her pharmacist suggested. She is also wanting to know whether she should start back on her cholesterol rx. or not. her Dr. told her to go off of it for a month if she wanted to. She uses [facility] on [address]. NKDA. Referrals REFERRED TO PCP OFFICE Disagree/Comply: Comply

## 2014-10-08 ENCOUNTER — Ambulatory Visit: Payer: Self-pay | Admitting: Family Medicine

## 2014-10-29 ENCOUNTER — Ambulatory Visit (INDEPENDENT_AMBULATORY_CARE_PROVIDER_SITE_OTHER): Payer: Medicare HMO | Admitting: Family Medicine

## 2014-10-29 ENCOUNTER — Encounter: Payer: Self-pay | Admitting: Family Medicine

## 2014-10-29 ENCOUNTER — Ambulatory Visit (INDEPENDENT_AMBULATORY_CARE_PROVIDER_SITE_OTHER)
Admission: RE | Admit: 2014-10-29 | Discharge: 2014-10-29 | Disposition: A | Discharge status: Home / Self Care | Payer: Medicare HMO | Source: Ambulatory Visit | Attending: Family Medicine | Admitting: Family Medicine

## 2014-10-29 VITALS — BP 124/82 | HR 76 | Temp 98.2°F | Ht 64.0 in | Wt 166.2 lb

## 2014-10-29 DIAGNOSIS — R1032 Left lower quadrant pain: Secondary | ICD-10-CM

## 2014-10-29 NOTE — Patient Instructions (Signed)
Xray of abdomen today - I wonder if you have more constipation than you expected  Please take miralax daily for a week - keep your fluids up  We may consider other imaging (ie: CT scan) if not improved  Try to make sure you get enough fiber in diet   If symptoms worsen in the meantime let me know

## 2014-10-29 NOTE — Progress Notes (Signed)
Pre visit review using our clinic review tool, if applicable. No additional management support is needed unless otherwise documented below in the visit note. 

## 2014-10-29 NOTE — Progress Notes (Signed)
Subjective:    Patient ID: Frances Hoffman, female    DOB: Mar 17, 1940, 74 y.o.   MRN: [id number]  HPI Here with pain in her L side  Points to LLQ of abd and flank (mostly abd) No urinary symptoms   She does urinate at night - nl for her    It has been there about 6 months (not new)  Constant pain when she has it (but intermittent)  Not every day or every other day  Lasts on average 2 hours or so  No change with eating/ not eating / particular diet/ position   Note: has chronic constipation - that started in Dec  Tries to drink a lot of water  She takes 1/2 dose of a laxative daily (tends to have hard stools w/o it) Uses miralax  Nl colonoscopy 6 years ago  No blood in stool except for hemorrhoids - occasional   No n/v   Does not feel like she has a uti  Does not tend to get them without symptoms   This is the place where she had shingles in the past- but this feels nothing like it    Has a choleserol question  livalo - statin  She did stop her statin - thinks her short term memory was a bit better  Thinks she will go back on her atorvastatin    Patient Active Problem List   Diagnosis Date Noted  . Memory loss 09/22/2014  . Exercise intolerance 07/30/2014  . Loose stools 04/28/2014  . Estrogen deficiency 04/28/2014  . Severe diarrhea 04/17/2014  . UTI (urinary tract infection) 04/03/2014  . Tachycardia 04/03/2014  . Exertional dyspnea 08/27/2013  . Family history of heart disease 08/27/2013  . Dyspnea on exertion 08/18/2013  . Positional vertigo 08/18/2013  . Abdominal pain, left lower quadrant 09/09/2012  . Abnormal x-ray of neck 01/11/2012  . Encounter for Medicare annual wellness exam 11/23/2011  . Other screening mammogram 11/23/2011  . Insomnia 01/16/2011  . DRY MOUTH 08/15/2009  . Osteopenia 11/26/2008  . NEOPLASM, MALIGNANT, BREAST, HX OF 07/01/2008  . Recurrent UTI 01/15/2008  . WEIGHT GAIN 01/15/2008  . Hypothyroidism 12/12/2007  .  Hyperlipidemia 12/12/2007  . Adjustment disorder with mixed anxiety and depressed mood 12/12/2007  . GERD 12/12/2007  . ADHESIVE CAPSULITIS 12/12/2007   Past Medical History  Diagnosis Date  . Cancer 2000    breast had lumpectomy/radiation x36,mammo ,no chemo  . Depression   . GERD (gastroesophageal reflux disease)   . Hyperlipidemia   . Hypothyroid   . Frozen shoulder   . Osteopenia     BMD 2004, WNL 2008  . Shingles     chronic body pain, left side of body  . Dysuria-frequency syndrome     takes AZO  . Zoster     Hx of  . Hypothyroid    Past Surgical History  Procedure Laterality Date  . Breast surgery  2000    lumpectomy   Social History  Substance Use Topics  . Smoking status: Never Smoker   . Smokeless tobacco: Never Used  . Alcohol Use: No   Family History  Problem Relation Age of Onset  . Osteoporosis Mother   . Parkinsonism Father   . Cancer Sister     breast CA  . Cancer Other     breast CA  . Heart disease Maternal Grandmother   . Heart disease Maternal Grandfather   . Coronary artery disease Sister    Allergies  Allergen  Reactions  . Ciprofloxacin Other (See Comments)    Unk  . Citalopram Hydrobromide     Intrusive/ weird thoughts   . Flagyl [Metronidazole]    Current Outpatient Prescriptions on File Prior to Visit  Medication Sig Dispense Refill  . acetaminophen (TYLENOL) 500 MG tablet Take 1,000 mg by mouth every 8 (eight) hours as needed for fever (pain).    [name] atorvastatin (LIPITOR) 20 MG tablet TAKE ONE TABLET BY MOUTH ONCE DAILY 30 tablet 5  . carboxymethylcellulose 1 % ophthalmic solution Apply 1 drop to eye 3 (three) times daily as needed (for dry eyes).    . Cholecalciferol (VITAMIN D3) 2000 UNITS capsule Take 2,000 Units by mouth daily.      [name] escitalopram (LEXAPRO) 20 MG tablet Take 1 tablet (20 mg total) by mouth daily. 30 tablet 11  . levothyroxine (SYNTHROID, LEVOTHROID) 50 MCG tablet Take 1 tablet (50 mcg total) by mouth daily. 30  tablet 3  . meclizine (ANTIVERT) 25 MG tablet One pill by mouth at bedtime as needed for dizziness 30 tablet 0  . Multiple Vitamin (MULTIVITAMIN) tablet Take 1 tablet by mouth daily.      . Omega-3 Fatty Acids (FISH OIL PO) Take 1 capsule by mouth daily.    [name] omeprazole (PRILOSEC OTC) 20 MG tablet Take 20 mg by mouth daily.    . traMADol (ULTRAM) 50 MG tablet Take 1 tablet (50 mg total) by mouth every 12 (twelve) hours as needed for severe pain. 30 tablet 0  . vitamin C (ASCORBIC ACID) 500 MG tablet Take 500 mg by mouth daily.    . [DISCONTINUED] Diphenhydramine-APAP, sleep, (TYLENOL PM EXTRA STRENGTH PO) Take by mouth as needed.       No current facility-administered medications on file prior to visit.    Review of Systems    Review of Systems  Constitutional: Negative for fever, appetite change, fatigue and unexpected weight change.  Eyes: Negative for pain and visual disturbance.  Respiratory: Negative for cough and shortness of breath.   Cardiovascular: Negative for cp or palpitations    Gastrointestinal: Negative for nausea, diarrhea and pos for constipation. pos for LLQ pain  Genitourinary: Negative for urgency and frequency. neg for dysuria or hematuria or flank pain  Skin: Negative for pallor or rash   Neurological: Negative for weakness, light-headedness, numbness and headaches.  Hematological: Negative for adenopathy. Does not bruise/bleed easily.  Psychiatric/Behavioral: Negative for dysphoric mood. The patient is not nervous/anxious.      Objective:   Physical Exam  Constitutional: She appears well-developed and well-nourished. No distress.  overwt and well app  HENT:  Head: Normocephalic and atraumatic.  Mouth/Throat: Oropharynx is clear and moist.  Eyes: Conjunctivae and EOM are normal. Pupils are equal, round, and reactive to light. No scleral icterus.  Neck: Normal range of motion. Neck supple.  Cardiovascular: Normal rate, regular rhythm and normal heart sounds.     Pulmonary/Chest: Effort normal and breath sounds normal. No respiratory distress. She has no wheezes. She has no rales.  Abdominal: Soft. Bowel sounds are normal. She exhibits no distension, no pulsatile liver, no abdominal bruit and no mass. There is no hepatosplenomegaly. There is tenderness in the left lower quadrant. There is no rigidity, no rebound, no guarding, no CVA tenderness, no tenderness at McBurney's point and negative Murphy's sign.  Mild tenderness in LLQ w/o mass or rebound or guarding   Musculoskeletal: She exhibits no edema.  Lymphadenopathy:    She has no cervical adenopathy.  Neurological: She is alert. She has normal reflexes.  Skin: Skin is warm and dry. No erythema. No pallor.  Psychiatric: She has a normal mood and affect.          Assessment & Plan:   Problem List Items Addressed This Visit    Abdominal pain, left lower quadrant - Primary    Has had harder stools since dec  ? Constipation caused  Will check abd xray  Try a week of miralax full dose daily  Report back  Consider rep CT if not improved       Relevant Orders   DG Abd 2 Views (Completed)

## 2014-10-29 NOTE — Assessment & Plan Note (Signed)
Has had harder stools since dec  ? Constipation caused  Will check abd xray  Try a week of miralax full dose daily  Report back  Consider rep CT if not improved

## 2014-11-04 ENCOUNTER — Other Ambulatory Visit (INDEPENDENT_AMBULATORY_CARE_PROVIDER_SITE_OTHER): Payer: Medicare HMO

## 2014-11-04 DIAGNOSIS — E039 Hypothyroidism, unspecified: Secondary | ICD-10-CM | POA: Diagnosis not present

## 2014-11-04 LAB — TSH: TSH: 2.8 u[IU]/mL (ref 0.35–4.50)

## 2014-11-11 ENCOUNTER — Telehealth: Payer: Self-pay | Admitting: Family Medicine

## 2014-11-11 MED ORDER — ATORVASTATIN CALCIUM 20 MG PO TABS: 20.0000 mg | ORAL_TABLET | Freq: Every day | ORAL | Status: DC

## 2014-11-11 NOTE — Addendum Note (Signed)
Addended by: Tammi Sou on: 11/11/2014 04:09 PM   Modules accepted: Orders

## 2014-11-11 NOTE — Telephone Encounter (Signed)
done

## 2014-11-11 NOTE — Telephone Encounter (Signed)
Please change to 90 day supply and refill for a year

## 2014-11-11 NOTE — Telephone Encounter (Signed)
Please change to 90 d supply and refill for a year

## 2014-11-11 NOTE — Telephone Encounter (Signed)
Received fax request from [facility] to change atorvastatin (LIPITOR) 20 MG tablet to 90 days supply.  Last prescribed on 07/16/14.  Last seen on 10/29/14. Next appointment on 11/12/14.

## 2014-11-12 ENCOUNTER — Ambulatory Visit (INDEPENDENT_AMBULATORY_CARE_PROVIDER_SITE_OTHER): Payer: Medicare HMO | Admitting: Family Medicine

## 2014-11-12 ENCOUNTER — Encounter: Payer: Self-pay | Admitting: Family Medicine

## 2014-11-12 VITALS — BP 122/68 | HR 76 | Temp 97.9°F | Ht 64.0 in | Wt 166.5 lb

## 2014-11-12 DIAGNOSIS — E785 Hyperlipidemia, unspecified: Secondary | ICD-10-CM

## 2014-11-12 DIAGNOSIS — R413 Other amnesia: Secondary | ICD-10-CM | POA: Diagnosis not present

## 2014-11-12 NOTE — Patient Instructions (Addendum)
You scored high on the memory test today  Also it sounds like your mood is good  The atrovastatin (cholesterol med) did not seem to make a difference   No change in other medicines Keep reading and add some math or puzzles  Stay as social as possible -this helps memory!  Also stay physically active and keep going to the gym  If symptoms worsen please follow up with a friend of family member who can give me more history At any time we can refer you to neurology for more in depth testing   Let's continue to watch this !  For heartburn - change dosing of omeprazole (prilosec) to the am about 30 minutes before breakfast  If heartburn does not improve please let me know

## 2014-11-12 NOTE — Progress Notes (Signed)
Pre visit review using our clinic review tool, if applicable. No additional management support is needed unless otherwise documented below in the visit note. 

## 2014-11-12 NOTE — Progress Notes (Signed)
Subjective:    Patient ID: Frances Hoffman, female    DOB: 1940/05/15, 74 y.o.   MRN: [id number]  HPI Here with c/o of cognition /memory changes   She stopped statin for a month and no improvement   She is having some memory loss  For instance forgot where the library was recently (has not been in a long time however)  Is having trouble with comprehension with reading (only one book - she reads other things just fine- was complex)  Does not do any math or financial work   Scientist, product/process development around her have not really noticed a problem  husb occ reminds her that he said something   No accidents  Driving has not been an issue    No longer mentally fuzzy  Is often drowsy -can sleep easily  Currently sleeping fairly at night - has to get up to urinate   No headaches or dizziness    Lab Results  Component Value Date   VITAMINB12 713 09/22/2014      Chemistry      Component Value Date/Time   NA 139 09/22/2014 1302   K 4.2 09/22/2014 1302   CL 102 09/22/2014 1302   CO2 31 09/22/2014 1302   BUN 16 09/22/2014 1302   CREATININE 0.94 09/22/2014 1302   CREATININE 0.82 09/03/2013 0805      Component Value Date/Time   CALCIUM 9.9 09/22/2014 1302   ALKPHOS 104 09/22/2014 1302   AST 27 09/22/2014 1302   ALT 25 09/22/2014 1302   BILITOT 0.6 09/22/2014 1302       Lab Results  Component Value Date   TSH 2.80 11/04/2014    Mood/has hx of mood disorder  She is up and down "easy to get depressed"  Unsure if she has memory change with those episodes  - not in a period of depression now  Some anxiety- lot of worry in general - this is her normal Stress level is stable -no change    Wonders if she could drop off lexapro  Has not needed meclizine  Has not taken tramadol   Still takes prilosec   Lab Results  Component Value Date   VITAMINB12 713 09/22/2014   good   She takes MVI/ B vits /D and ca/ fish oil    MMS exam : score 29 with a normal clock draw test in addition to that    Patient Active Problem List   Diagnosis Date Noted  . Memory loss 09/22/2014  . Exercise intolerance 07/30/2014  . Loose stools 04/28/2014  . Estrogen deficiency 04/28/2014  . Severe diarrhea 04/17/2014  . UTI (urinary tract infection) 04/03/2014  . Tachycardia 04/03/2014  . Exertional dyspnea 08/27/2013  . Family history of heart disease 08/27/2013  . Dyspnea Hoffman exertion 08/18/2013  . Positional vertigo 08/18/2013  . Abdominal pain, left lower quadrant 09/09/2012  . Abnormal x-ray of neck 01/11/2012  . Encounter for Medicare annual wellness exam 11/23/2011  . Other screening mammogram 11/23/2011  . Insomnia 01/16/2011  . DRY MOUTH 08/15/2009  . Osteopenia 11/26/2008  . NEOPLASM, MALIGNANT, BREAST, HX OF 07/01/2008  . Recurrent UTI 01/15/2008  . WEIGHT GAIN 01/15/2008  . Hypothyroidism 12/12/2007  . Hyperlipidemia 12/12/2007  . Adjustment disorder with mixed anxiety and depressed mood 12/12/2007  . GERD 12/12/2007  . ADHESIVE CAPSULITIS 12/12/2007   Past Medical History  Diagnosis Date  . Cancer 2000    breast had lumpectomy/radiation x36,mammo ,no chemo  . Depression   .  GERD (gastroesophageal reflux disease)   . Hyperlipidemia   . Hypothyroid   . Frozen shoulder   . Osteopenia     BMD 2004, WNL 2008  . Shingles     chronic body pain, left side of body  . Dysuria-frequency syndrome     takes AZO  . Zoster     Hx of  . Hypothyroid    Past Surgical History  Procedure Laterality Date  . Breast surgery  2000    lumpectomy   Social History  Substance Use Topics  . Smoking status: Never Smoker   . Smokeless tobacco: Never Used  . Alcohol Use: No   Family History  Problem Relation Age of Onset  . Osteoporosis Mother   . Parkinsonism Father   . Cancer Sister     breast CA  . Cancer Other     breast CA  . Heart disease Maternal Grandmother   . Heart disease Maternal Grandfather   . Coronary artery disease Sister    Allergies  Allergen Reactions  .  Ciprofloxacin Other (See Comments)    Unk  . Citalopram Hydrobromide     Intrusive/ weird thoughts   . Flagyl [Metronidazole]    Current Outpatient Prescriptions Hoffman File Prior to Visit  Medication Sig Dispense Refill  . acetaminophen (TYLENOL) 500 MG tablet Take 1,000 mg by mouth every 8 (eight) hours as needed for fever (pain).    [name] atorvastatin (LIPITOR) 20 MG tablet Take 1 tablet (20 mg total) by mouth daily. 90 tablet 3  . carboxymethylcellulose 1 % ophthalmic solution Apply 1 drop to eye 3 (three) times daily as needed (for dry eyes).    . Cholecalciferol (VITAMIN D3) 2000 UNITS capsule Take 2,000 Units by mouth daily.      [name] escitalopram (LEXAPRO) 20 MG tablet Take 1 tablet (20 mg total) by mouth daily. 30 tablet 11  . levothyroxine (SYNTHROID, LEVOTHROID) 50 MCG tablet Take 1 tablet (50 mcg total) by mouth daily. 30 tablet 3  . Multiple Vitamin (MULTIVITAMIN) tablet Take 1 tablet by mouth daily.      . Omega-3 Fatty Acids (FISH OIL PO) Take 1 capsule by mouth daily.    [name] omeprazole (PRILOSEC OTC) 20 MG tablet Take 20 mg by mouth daily.    . vitamin C (ASCORBIC ACID) 500 MG tablet Take 500 mg by mouth daily.    . [DISCONTINUED] Diphenhydramine-APAP, sleep, (TYLENOL PM EXTRA STRENGTH PO) Take by mouth as needed.       No current facility-administered medications Hoffman file prior to visit.     Review of Systems Review of Systems  Constitutional: Negative for fever, appetite change, and unexpected weight change. pos for occ fatigue  Eyes: Negative for pain and visual disturbance.  Respiratory: Negative for cough and shortness of breath.   Cardiovascular: Negative for cp or palpitations    Gastrointestinal: Negative for nausea, diarrhea and constipation.  Pos for occ heartburn (takes her gerd med at night)  Genitourinary: Negative for urgency and frequency.  Skin: Negative for pallor or rash   Neurological: Negative for weakness, light-headedness, numbness and headaches.    Hematological: Negative for adenopathy. Does not bruise/bleed easily.  Psychiatric/Behavioral: Negative for dysphoric mood. The patient is not nervous/anxious.  (mood is stable Hoffman lexapro at this time) and neg for stressors (more than usual)       Objective:   Physical Exam  Constitutional: She appears well-developed and well-nourished. No distress.  overwt and well appearing   HENT:  Head: Normocephalic and atraumatic.  Eyes: Conjunctivae and EOM are normal. Pupils are equal, round, and reactive to light.  Neurological: She is alert. She has normal reflexes. No cranial nerve deficit. She exhibits normal muscle tone. Coordination normal.  Skin: Skin is warm and dry. No rash noted. No pallor.  Psychiatric: She has a normal mood and affect. Her speech is normal and behavior is normal. Judgment and thought content normal. Her mood appears not anxious. Her affect is not angry, not blunt, not labile and not inappropriate. Cognition and memory are normal. She does not exhibit a depressed mood.  Attentive and mentally sharp today Not confused  Does not repeat herself  Not seemingly depressed or anxious Scored 29/30 for MMS and had a nl clock draw test           Assessment & Plan:   Problem List Items Addressed This Visit      Other   Hyperlipidemia - Primary    Pt is back Hoffman statin- stopping it did not help memory issues/ suspect unrelated       Memory loss    Pt is concerned about some short term memory loss and slowed cognition  Rev hx in detail  Rev latest labs incl nl B12 level MMS exam today score 29/30 -pt was quite mentally sharp  Will watch this closely and also offered neuro ref at any time for more concentrated psychometric testing (she declines right now) Family will watch her closely  Rev red flags for dementia Rev meds as well and disc medications that can affect cognition or cause sedation  Enc activities that help memory incl socialization/reading/ puzzles and  math  >25 minutes spent in face to face time with patient, >50% spent in counselling or coordination of care

## 2014-11-14 NOTE — Assessment & Plan Note (Signed)
Pt is concerned about some short term memory loss and slowed cognition  Rev hx in detail  Rev latest labs incl nl B12 level MMS exam today score 29/30 -pt was quite mentally sharp  Will watch this closely and also offered neuro ref at any time for more concentrated psychometric testing (she declines right now) Family will watch her closely  Rev red flags for dementia Rev meds as well and disc medications that can affect cognition or cause sedation  Enc activities that help memory incl socialization/reading/ puzzles and math  >25 minutes spent in face to face time with patient, >50% spent in counselling or coordination of care

## 2014-11-14 NOTE — Assessment & Plan Note (Signed)
Pt is back on statin- stopping it did not help memory issues/ suspect unrelated

## 2014-11-30 ENCOUNTER — Ambulatory Visit (INDEPENDENT_AMBULATORY_CARE_PROVIDER_SITE_OTHER): Payer: Medicare HMO

## 2014-11-30 DIAGNOSIS — Z23 Encounter for immunization: Secondary | ICD-10-CM | POA: Diagnosis not present

## 2015-01-24 ENCOUNTER — Ambulatory Visit (INDEPENDENT_AMBULATORY_CARE_PROVIDER_SITE_OTHER): Payer: Medicare HMO | Admitting: Family Medicine

## 2015-01-24 ENCOUNTER — Encounter: Payer: Self-pay | Admitting: Family Medicine

## 2015-01-24 VITALS — BP 128/72 | HR 84 | Temp 98.2°F | Ht 64.0 in | Wt 168.5 lb

## 2015-01-24 DIAGNOSIS — R1013 Epigastric pain: Secondary | ICD-10-CM | POA: Diagnosis not present

## 2015-01-24 LAB — COMPREHENSIVE METABOLIC PANEL
ALBUMIN: 3.9 g/dL (ref 3.5–5.2)
ALK PHOS: 128 U/L — AB (ref 39–117)
ALT: 118 U/L — AB (ref 0–35)
AST: 162 U/L — ABNORMAL HIGH (ref 0–37)
BILIRUBIN TOTAL: 0.6 mg/dL (ref 0.2–1.2)
BUN: 16 mg/dL (ref 6–23)
CO2: 28 mEq/L (ref 19–32)
Calcium: 9.7 mg/dL (ref 8.4–10.5)
Chloride: 102 mEq/L (ref 96–112)
Creatinine, Ser: 0.99 mg/dL (ref 0.40–1.20)
GFR: 58.22 mL/min — AB (ref >60.00)
GLUCOSE: 92 mg/dL (ref 70–99)
POTASSIUM: 4 meq/L (ref 3.5–5.1)
SODIUM: 137 meq/L (ref 135–145)
TOTAL PROTEIN: 6.8 g/dL (ref 6.0–8.3)

## 2015-01-24 LAB — LIPASE: Lipase: 45 U/L (ref 11.0–59.0)

## 2015-01-24 MED ORDER — PANTOPRAZOLE SODIUM 40 MG PO TBEC: 40.0000 mg | DELAYED_RELEASE_TABLET | Freq: Two times a day (BID) | ORAL | Status: DC

## 2015-01-24 NOTE — Progress Notes (Signed)
Subjective:  Patient ID: ELA CHAMPION, female    DOB: 1940-11-06  Age: 74 y.o. MRN: [id number]  CC: Abdominal pain  HPI:  74 year old female presents to clinic today for an acute visit with complaints of abdominal pain.  Abdominal pain  Patient reports that she's had epigastric pain for the past 2-3 days.  Pain is constant.  She describes it as achy.  Pain is currently 5/10 in severity.  No associated fevers chills.  She reports some intermittent nausea but denies vomiting.  No hematochezia or melena.  No exacerbating or relieving factors.  Social Hx   Social History   Social History  . Marital Status: Married    Spouse Name: N/A  . Number of Children: N/A  . Years of Education: N/A   Social History Main Topics  . Smoking status: Never Smoker   . Smokeless tobacco: Never Used  . Alcohol Use: No  . Drug Use: No  . Sexual Activity: Not Asked   Other Topics Concern  . None   Social History Narrative   Review of Systems  Constitutional: Negative.   Gastrointestinal: Positive for nausea and abdominal pain. Negative for vomiting, constipation and blood in stool.   Objective:  BP 128/72 mmHg  Pulse 84  Temp(Src) 98.2 F (36.8 C) (Oral)  Ht 5\' 4"  (1.626 m)  Wt 168 lb 8 oz (76.431 kg)  BMI 28.91 kg/m2  SpO2 95%  BP/Weight 01/24/2015 11/12/2014 10/29/2014  Systolic BP 128 122 124  Diastolic BP 72 68 82  Wt. (Lbs) 168.5 166.5 166.25  BMI 28.91 28.57 28.52   Physical Exam  Constitutional: She appears well-developed. No distress.  Cardiovascular: Normal rate and regular rhythm.   Pulmonary/Chest: Effort normal and breath sounds normal.  Abdominal: Soft. She exhibits no distension.  Epigastric tenderness noted. No rebound or guarding.   Neurological: She is alert.  Psychiatric: She has a normal mood and affect.  Vitals reviewed.    Assessment & Plan:   Problem List Items Addressed This Visit    Abdominal pain, epigastric - Primary    Gastritis  vs worsening GERD vs PUD. Treating with Protonix 40 mg BID. Follow up next week with PCP. May need referral for endoscopy and possibly H. Pylori testing.      Relevant Orders   CBC   Comp Met (CMET)   Lipase      Meds ordered this encounter  Medications  . pantoprazole (PROTONIX) 40 MG tablet    Sig: Take 1 tablet (40 mg total) by mouth 2 (two) times daily before a meal.    Dispense:  60 tablet    Refill:  1    Follow-up: Next week  Everlene Other, DO

## 2015-01-24 NOTE — Assessment & Plan Note (Signed)
Gastritis vs worsening GERD vs PUD. Treating with Protonix 40 mg BID. Follow up next week with PCP. May need referral for endoscopy and possibly H. Pylori testing.

## 2015-01-24 NOTE — Progress Notes (Signed)
Pre visit review using our clinic review tool, if applicable. No additional management support is needed unless otherwise documented below in the visit note. 

## 2015-01-24 NOTE — Patient Instructions (Signed)
Stop the Omeprazole.   Start the Protonix twice daily.  Follow up next week. If you worsen, please let me know.  Take care  Dr. Lacinda Axon

## 2015-01-25 ENCOUNTER — Ambulatory Visit: Payer: Medicare HMO | Admitting: Family Medicine

## 2015-01-25 ENCOUNTER — Other Ambulatory Visit: Payer: Self-pay | Admitting: Family Medicine

## 2015-01-25 DIAGNOSIS — R7989 Other specified abnormal findings of blood chemistry: Secondary | ICD-10-CM

## 2015-01-25 DIAGNOSIS — R945 Abnormal results of liver function studies: Secondary | ICD-10-CM

## 2015-01-25 DIAGNOSIS — R748 Abnormal levels of other serum enzymes: Secondary | ICD-10-CM

## 2015-01-25 DIAGNOSIS — R1013 Epigastric pain: Secondary | ICD-10-CM

## 2015-01-25 LAB — CBC
HEMATOCRIT: 41.8 % (ref 36.0–46.0)
HEMOGLOBIN: 13.9 g/dL (ref 12.0–15.0)
MCHC: 33.3 g/dL (ref 30.0–36.0)
MCV: 93.2 fl (ref 78.0–100.0)
Platelets: 257 10*3/uL (ref 150.0–400.0)
RBC: 4.49 Mil/uL (ref 3.87–5.11)
RDW: 13.5 % (ref 11.5–15.5)
WBC: 9 10*3/uL (ref 4.0–10.5)

## 2015-01-26 ENCOUNTER — Ambulatory Visit (HOSPITAL_COMMUNITY)
Admission: RE | Admit: 2015-01-26 | Discharge: 2015-01-26 | Disposition: A | Discharge status: Home / Self Care | Payer: Medicare HMO | Source: Ambulatory Visit | Attending: Family Medicine | Admitting: Family Medicine

## 2015-01-26 ENCOUNTER — Telehealth: Payer: Self-pay | Admitting: Family Medicine

## 2015-01-26 ENCOUNTER — Other Ambulatory Visit: Payer: Self-pay

## 2015-01-26 DIAGNOSIS — R945 Abnormal results of liver function studies: Secondary | ICD-10-CM

## 2015-01-26 DIAGNOSIS — Z853 Personal history of malignant neoplasm of breast: Secondary | ICD-10-CM | POA: Diagnosis not present

## 2015-01-26 DIAGNOSIS — Z1231 Encounter for screening mammogram for malignant neoplasm of breast: Secondary | ICD-10-CM

## 2015-01-26 DIAGNOSIS — K8020 Calculus of gallbladder without cholecystitis without obstruction: Secondary | ICD-10-CM | POA: Insufficient documentation

## 2015-01-26 DIAGNOSIS — R7989 Other specified abnormal findings of blood chemistry: Secondary | ICD-10-CM

## 2015-01-26 DIAGNOSIS — R1013 Epigastric pain: Secondary | ICD-10-CM | POA: Insufficient documentation

## 2015-01-26 DIAGNOSIS — R748 Abnormal levels of other serum enzymes: Secondary | ICD-10-CM

## 2015-01-26 NOTE — Telephone Encounter (Signed)
-----   Message from Tammi Sou, [place] sent at 01/26/2015 12:57 PM EST ----- Pt notified of [place] and lab results and Dr. Marliss Coots comments. Pt agrees with referral to Surgeon, pt said she has seen Dr. Margot Chimes with [facility] in the past and would like to go back if possible, I advise pt one of our [facility] will call to schedule appt., f/u with Dr. Glori Bickers scheduled for next week and pt advise if sxs worsen or vomiting starts to go to ER asap and pt verbalized understanding

## 2015-01-26 NOTE — Telephone Encounter (Signed)
Referral done

## 2015-02-01 ENCOUNTER — Encounter: Payer: Self-pay | Admitting: Family Medicine

## 2015-02-01 ENCOUNTER — Ambulatory Visit (INDEPENDENT_AMBULATORY_CARE_PROVIDER_SITE_OTHER): Payer: Medicare HMO | Admitting: Family Medicine

## 2015-02-01 VITALS — BP 106/58 | HR 75 | Temp 98.0°F | Ht 64.0 in | Wt 168.5 lb

## 2015-02-01 DIAGNOSIS — R300 Dysuria: Secondary | ICD-10-CM | POA: Insufficient documentation

## 2015-02-01 DIAGNOSIS — K8020 Calculus of gallbladder without cholecystitis without obstruction: Secondary | ICD-10-CM | POA: Diagnosis not present

## 2015-02-01 LAB — HEPATIC FUNCTION PANEL
ALBUMIN: 3.8 g/dL (ref 3.5–5.2)
ALT: 61 U/L — ABNORMAL HIGH (ref 0–35)
AST: 29 U/L (ref 0–37)
Alkaline Phosphatase: 132 U/L — ABNORMAL HIGH (ref 39–117)
Bilirubin, Direct: 0.1 mg/dL (ref 0.0–0.3)
Total Bilirubin: 0.5 mg/dL (ref 0.2–1.2)
Total Protein: 6.8 g/dL (ref 6.0–8.3)

## 2015-02-01 LAB — BASIC METABOLIC PANEL
BUN: 15 mg/dL (ref 6–23)
CALCIUM: 9.7 mg/dL (ref 8.4–10.5)
CHLORIDE: 103 meq/L (ref 96–112)
CO2: 31 meq/L (ref 19–32)
Creatinine, Ser: 0.96 mg/dL (ref 0.40–1.20)
GFR: 60.32 mL/min (ref >60.00)
Glucose, Bld: 83 mg/dL (ref 70–99)
Potassium: 4.5 mEq/L (ref 3.5–5.1)
SODIUM: 139 meq/L (ref 135–145)

## 2015-02-01 LAB — POCT URINALYSIS DIPSTICK
Bilirubin, UA: NEGATIVE
Blood, UA: NEGATIVE
Glucose, UA: NEGATIVE
KETONES UA: NEGATIVE
Nitrite, UA: NEGATIVE
PH UA: 6
SPEC GRAV UA: 1.015
Urobilinogen, UA: 0.2

## 2015-02-01 NOTE — Progress Notes (Signed)
Subjective:    Patient ID: Frances Hoffman, female    DOB: 1940-07-28, 74 y.o.   MRN: [id number]  HPI Here for f/u of gallstones  Went to the [facility] office with abd pain  Upper abd soreness and bloating  This is improved - watching fat (does not eat a lot of fat)   On protoinix -works well  occ a little heartburn-not often   US Abdomen Limited Ruq  01/26/2015  CLINICAL DATA:  Right upper quadrant pain for 2 weeks. Additional history of breast cancer. EXAM: US ABDOMEN LIMITED - RIGHT UPPER QUADRANT COMPARISON:  None. FINDINGS: Gallbladder: Gallstone within the gallbladder neck region measures approximately 12 mm greatest dimension, with a possible additional adjacent 4 mm stone. Gallbladder appears otherwise unremarkable. No gallbladder distension. No gallbladder wall thickening, pericholecystic edema or other secondary signs of acute cholecystitis. Common bile duct: Diameter: Normal at 2 mm. Liver: 16 mm hypoechoic lesion within the right liver lobe is compatible with the benign hepatic lesion described on CT of 04/03/2014, presumably a stable benign cyst. No suspicious lesions identified within the liver. Overall echogenicity of the liver is within normal limits. No intrahepatic bile duct dilatation. Main portal vein is shown to be patent with appropriate hepatopetal direction of blood flow. IMPRESSION: 1. Cholelithiasis without evidence of acute cholecystitis. 2. No acute findings. Electronically Signed   By: Bary Richard M.D.   On: 01/26/2015 07:55     No n/v or fever No flank pain  No blood in urine   A little burning on urination today Results for orders placed or performed in visit on 02/01/15  POCT urinalysis dipstick  Result Value Ref Range   Color, UA Yellow    Clarity, UA Clear    Glucose, UA Neg.    Bilirubin, UA Neg.    Ketones, UA Neg.    Spec Grav, UA 1.015    Blood, UA Neg.    pH, UA 6.0    Protein, UA Trace    Urobilinogen, UA 0.2    Nitrite, UA Neg.    Leukocytes, UA small (1+) (A) Negative      Did order [place]  Cholelithiasis without acute cholecystitis  No acute findings  Liver tests elevated  Lab Results  Component Value Date   ALT 118* 01/24/2015   AST 162* 01/24/2015   ALKPHOS 128* 01/24/2015   BILITOT 0.6 01/24/2015    No prior hx of liver problems  No hx of hepatitis  Has been on lipitor for a long time without problems  No acetaminophen No alcohol   Patient Active Problem List   Diagnosis Date Noted  . Cholelithiasis 01/26/2015  . Abdominal pain, epigastric 01/24/2015  . Memory loss 09/22/2014  . Exercise intolerance 07/30/2014  . Estrogen deficiency 04/28/2014  . Tachycardia 04/03/2014  . Family history of heart disease 08/27/2013  . Dyspnea on exertion 08/18/2013  . Positional vertigo 08/18/2013  . Abdominal pain, left lower quadrant 09/09/2012  . Abnormal x-ray of neck 01/11/2012  . Encounter for Medicare annual wellness exam 11/23/2011  . Other screening mammogram 11/23/2011  . Insomnia 01/16/2011  . Osteopenia 11/26/2008  . NEOPLASM, MALIGNANT, BREAST, HX OF 07/01/2008  . Recurrent UTI 01/15/2008  . WEIGHT GAIN 01/15/2008  . Hypothyroidism 12/12/2007  . Hyperlipidemia 12/12/2007  . Adjustment disorder with mixed anxiety and depressed mood 12/12/2007  . GERD 12/12/2007  . ADHESIVE CAPSULITIS 12/12/2007   Past Medical History  Diagnosis Date  . Cancer [facility]) 2000    breast  had lumpectomy/radiation x36,mammo ,no chemo  . Depression   . GERD (gastroesophageal reflux disease)   . Hyperlipidemia   . Hypothyroid   . Frozen shoulder   . Osteopenia     BMD 2004, WNL 2008  . Shingles     chronic body pain, left side of body  . Dysuria-frequency syndrome     takes AZO  . Zoster     Hx of  . Hypothyroid    Past Surgical History  Procedure Laterality Date  . Breast surgery  2000    lumpectomy   Social History  Substance Use Topics  . Smoking status: Never Smoker   . Smokeless tobacco: Never  Used  . Alcohol Use: No   Family History  Problem Relation Age of Onset  . Osteoporosis Mother   . Parkinsonism Father   . Cancer Sister     breast CA  . Cancer Other     breast CA  . Heart disease Maternal Grandmother   . Heart disease Maternal Grandfather   . Coronary artery disease Sister    Allergies  Allergen Reactions  . Ciprofloxacin Other (See Comments)    Unk  . Citalopram Hydrobromide     Intrusive/ weird thoughts   . Flagyl [Metronidazole]    Current Outpatient Prescriptions on File Prior to Visit  Medication Sig Dispense Refill  . atorvastatin (LIPITOR) 20 MG tablet Take 1 tablet (20 mg total) by mouth daily. 90 tablet 3  . carboxymethylcellulose 1 % ophthalmic solution Apply 1 drop to eye 3 (three) times daily as needed (for dry eyes).    . Cholecalciferol (VITAMIN D3) 2000 UNITS capsule Take 2,000 Units by mouth daily.      [name] escitalopram (LEXAPRO) 20 MG tablet Take 1 tablet (20 mg total) by mouth daily. 30 tablet 11  . levothyroxine (SYNTHROID, LEVOTHROID) 50 MCG tablet Take 1 tablet (50 mcg total) by mouth daily. 30 tablet 3  . Multiple Vitamin (MULTIVITAMIN) tablet Take 1 tablet by mouth daily.      . Omega-3 Fatty Acids (FISH OIL PO) Take 1 capsule by mouth daily.    . pantoprazole (PROTONIX) 40 MG tablet Take 1 tablet (40 mg total) by mouth 2 (two) times daily before a meal. 60 tablet 1  . vitamin C (ASCORBIC ACID) 500 MG tablet Take 500 mg by mouth daily.    . [DISCONTINUED] Diphenhydramine-APAP, sleep, (TYLENOL PM EXTRA STRENGTH PO) Take by mouth as needed.       No current facility-administered medications on file prior to visit.     Has appt with Surgeon - on Thursday    Review of Systems Review of Systems  Constitutional: Negative for fever, appetite change, fatigue and unexpected weight change.  Eyes: Negative for pain and visual disturbance.  Respiratory: Negative for cough and shortness of breath.   Cardiovascular: Negative for cp or  palpitations    Gastrointestinal: Negative for nausea, diarrhea and constipation. pos for intermittent upper abd pain  Genitourinary: Negative for urgency and frequency. pos for dysuria Skin: Negative for pallor or rash   Neurological: Negative for weakness, light-headedness, numbness and headaches.  Hematological: Negative for adenopathy. Does not bruise/bleed easily.  Psychiatric/Behavioral: Negative for dysphoric mood. The patient is not nervous/anxious.         Objective:   Physical Exam  Constitutional: She appears well-developed and well-nourished. No distress.  HENT:  Head: Normocephalic and atraumatic.  Eyes: Conjunctivae and EOM are normal. Pupils are equal, round, and reactive to light.  Neck: Normal range of motion. Neck supple.  Cardiovascular: Normal rate, regular rhythm and normal heart sounds.   Pulmonary/Chest: Effort normal and breath sounds normal.  Abdominal: Soft. Bowel sounds are normal. She exhibits no distension. There is tenderness in the epigastric area. There is no rigidity, no rebound, no guarding, no CVA tenderness, no tenderness at McBurney's point and negative Murphy's sign.  No cva tenderness  No suprapubic tenderness or fullness    Musculoskeletal: She exhibits no edema.  Lymphadenopathy:    She has no cervical adenopathy.  Neurological: She is alert.  Skin: No rash noted. No erythema. No pallor.  No jaundice   Psychiatric: She has a normal mood and affect.          Assessment & Plan:   Problem List Items Addressed This Visit      Digestive   Cholelithiasis    Symptoms improved with low fat diet  LFTs prev elevated  No acetaminophen or tylenol  Repeat those today F/u with surgeon as planned this week       Relevant Orders   Hepatic function panel (Completed)   Basic metabolic panel (Completed)     Other   Dysuria - Primary    ua is borderline inst to inc water /avoid other beverages Pending cx -will tx if pos      Relevant  Orders   POCT urinalysis dipstick (Completed)   Urine culture (Completed)

## 2015-02-01 NOTE — Progress Notes (Signed)
Pre visit review using our clinic review tool, if applicable. No additional management support is needed unless otherwise documented below in the visit note. 

## 2015-02-01 NOTE — Patient Instructions (Signed)
I'm glad you are feeling better  If symptoms suddenly worsen - let us know  Eat a lot fat diet Re checking liver tests today Also a urine culture- will contact you when that returns - if infection we will treat it  Drink lots of water    Follow up with surgeon Thursday as planned

## 2015-02-02 ENCOUNTER — Encounter: Payer: Self-pay | Admitting: *Deleted

## 2015-02-03 DIAGNOSIS — K8020 Calculus of gallbladder without cholecystitis without obstruction: Secondary | ICD-10-CM | POA: Diagnosis not present

## 2015-02-03 LAB — URINE CULTURE: Colony Count: 50000

## 2015-02-03 NOTE — Assessment & Plan Note (Signed)
Symptoms improved with low fat diet  LFTs prev elevated  No acetaminophen or tylenol  Repeat those today F/u with surgeon as planned this week

## 2015-02-03 NOTE — Assessment & Plan Note (Signed)
ua is borderline inst to inc water /avoid other beverages Pending cx -will tx if pos

## 2015-02-09 ENCOUNTER — Other Ambulatory Visit: Payer: Self-pay | Admitting: Family Medicine

## 2015-02-17 ENCOUNTER — Encounter (HOSPITAL_COMMUNITY): Payer: Self-pay

## 2015-02-17 ENCOUNTER — Encounter (HOSPITAL_COMMUNITY)
Admission: RE | Admit: 2015-02-17 | Discharge: 2015-02-17 | Disposition: A | Discharge status: Home / Self Care | Payer: Medicare HMO | Source: Ambulatory Visit | Attending: General Surgery | Admitting: General Surgery

## 2015-02-17 ENCOUNTER — Ambulatory Visit: Payer: Self-pay | Admitting: General Surgery

## 2015-02-17 DIAGNOSIS — Z01812 Encounter for preprocedural laboratory examination: Secondary | ICD-10-CM | POA: Insufficient documentation

## 2015-02-17 DIAGNOSIS — K8020 Calculus of gallbladder without cholecystitis without obstruction: Secondary | ICD-10-CM | POA: Diagnosis not present

## 2015-02-17 DIAGNOSIS — Z923 Personal history of irradiation: Secondary | ICD-10-CM | POA: Insufficient documentation

## 2015-02-17 DIAGNOSIS — R69 Illness, unspecified: Secondary | ICD-10-CM | POA: Diagnosis not present

## 2015-02-17 DIAGNOSIS — K219 Gastro-esophageal reflux disease without esophagitis: Secondary | ICD-10-CM | POA: Diagnosis not present

## 2015-02-17 DIAGNOSIS — F329 Major depressive disorder, single episode, unspecified: Secondary | ICD-10-CM | POA: Diagnosis not present

## 2015-02-17 DIAGNOSIS — E039 Hypothyroidism, unspecified: Secondary | ICD-10-CM | POA: Diagnosis not present

## 2015-02-17 DIAGNOSIS — Z01818 Encounter for other preprocedural examination: Secondary | ICD-10-CM | POA: Diagnosis not present

## 2015-02-17 DIAGNOSIS — E785 Hyperlipidemia, unspecified: Secondary | ICD-10-CM | POA: Insufficient documentation

## 2015-02-17 DIAGNOSIS — Z79899 Other long term (current) drug therapy: Secondary | ICD-10-CM | POA: Diagnosis not present

## 2015-02-17 DIAGNOSIS — Z853 Personal history of malignant neoplasm of breast: Secondary | ICD-10-CM | POA: Insufficient documentation

## 2015-02-17 HISTORY — DX: Presence of spectacles and contact lenses: Z97.3

## 2015-02-17 HISTORY — DX: Unspecified cataract: H26.9

## 2015-02-17 HISTORY — DX: Urinary tract infection, site not specified: N39.0

## 2015-02-17 HISTORY — DX: Reserved for inherently not codable concepts without codable children: IMO0001

## 2015-02-17 HISTORY — DX: Calculus of gallbladder without cholecystitis without obstruction: K80.20

## 2015-02-17 HISTORY — DX: Pneumonia, unspecified organism: J18.9

## 2015-02-17 LAB — CBC
HEMATOCRIT: 42.4 % (ref 36.0–46.0)
Hemoglobin: 14 g/dL (ref 12.0–15.0)
MCH: 31.1 pg (ref 26.0–34.0)
MCHC: 33 g/dL (ref 30.0–36.0)
MCV: 94.2 fL (ref 78.0–100.0)
Platelets: 240 10*3/uL (ref 150–400)
RBC: 4.5 MIL/uL (ref 3.87–5.11)
RDW: 13.3 % (ref 11.5–15.5)
WBC: 6.8 10*3/uL (ref 4.0–10.5)

## 2015-02-17 LAB — COMPREHENSIVE METABOLIC PANEL
ALBUMIN: 3.6 g/dL (ref 3.5–5.0)
ALT: 26 U/L (ref 14–54)
ANION GAP: 9 (ref 5–15)
AST: 32 U/L (ref 15–41)
Alkaline Phosphatase: 106 U/L (ref 38–126)
BILIRUBIN TOTAL: 0.6 mg/dL (ref 0.3–1.2)
BUN: 13 mg/dL (ref 6–20)
CO2: 26 mmol/L (ref 22–32)
Calcium: 9.7 mg/dL (ref 8.9–10.3)
Chloride: 106 mmol/L (ref 101–111)
Creatinine, Ser: 1.12 mg/dL — ABNORMAL HIGH (ref 0.44–1.00)
GFR calc non Af Amer: 47 mL/min — ABNORMAL LOW (ref >60)
GFR, EST AFRICAN AMERICAN: 55 mL/min — AB (ref >60)
GLUCOSE: 87 mg/dL (ref 65–99)
POTASSIUM: 4.5 mmol/L (ref 3.5–5.1)
Sodium: 141 mmol/L (ref 135–145)
TOTAL PROTEIN: 6.7 g/dL (ref 6.5–8.1)

## 2015-02-17 NOTE — Progress Notes (Signed)
Spoke with Ebony Hail, [place], anesthesia, regarding pt chest x ray and EKG dated 04/04/14; Ebony Hail, [place],  advised to repeat chest x ray if pt has SOB or any acute symptoms. Pt denies chest pain but stated that she " usually only gets SOB when climbing stairs ."  Pt denies any acute pulmonary issues.  Pt denies having a cardiac cath.

## 2015-02-17 NOTE — Pre-Procedure Instructions (Signed)
NZINGA GREENSPAN  02/17/2015      [facility] - [place] ([place]), [place] [id number] [place] [address] Phone: [phone] Fax: [phone]  [place], [place] [id number] [place] [address] Phone: [phone] Fax: [phone]  [facility] [place], [place] [address] [place] [address] Phone: [phone] Fax: [phone]    Your procedure is scheduled on Monday, February 21, 2015  Report to [facility] Admitting at 7:30 A.M.  Call this number if you have problems the morning of surgery:  [phone]   Remember:  Do not eat food or drink liquids after midnight Sunday, February 20, 2015  Take these medicines the morning of surgery with A SIP OF WATER : escitalopram (LEXAPRO),  pantoprazole (PROTONIX), levothyroxine (SYNTHROID, LEVOTHROID)  Stop taking Aspirin, vitamins, fish oil, and herbal medications. Do not take any NSAIDs ie: Ibuprofen, Advil, Naproxen or any medication containing Aspirin; stop now.   Do not wear jewelry, make-up or nail polish.  Do not wear lotions, powders, or perfumes.  You may not wear deodorant.  Do not shave 48 hours prior to surgery.    Do not bring valuables to the hospital.  [facility] is not responsible for any belongings or valuables.  Contacts, dentures or bridgework may not be worn into surgery.  Leave your suitcase in the car.  After surgery it may be brought to your room.  For patients admitted to the hospital, discharge time will be determined by your treatment team.  Patients discharged the day of surgery will not be allowed to drive home.   Name and phone number of your driver:   Special instructions: [facility] - Preparing for Surgery  Before surgery, you can play an important role.  Because skin is not sterile, your skin needs to be as free  of germs as possible.  You can reduce the number of germs on you skin by washing with CHG (chlorahexidine gluconate) soap before surgery.  CHG is an antiseptic cleaner which kills germs and bonds with the skin to continue killing germs even after washing.  Please DO NOT use if you have an allergy to CHG or antibacterial soaps.  If your skin becomes reddened/irritated stop using the CHG and inform your nurse when you arrive at Short Stay.  Do not shave (including legs and underarms) for at least 48 hours prior to the first CHG shower.  You may shave your face.  Please follow these instructions carefully:   1.  Shower with CHG Soap the night before surgery and the morning of Surgery.  2.  If you choose to wash your hair, wash your hair first as usual with your normal shampoo.  3.  After you shampoo, rinse your hair and body thoroughly to remove the Shampoo.  4.  Use CHG as you would any other liquid soap.  You can apply chg directly  to the skin and wash gently with scrungie or a clean washcloth.  5.  Apply the CHG Soap to your body ONLY FROM THE NECK DOWN.  Do not use on open wounds or open sores.  Avoid contact with your eyes, ears, mouth and genitals (private parts).  Wash genitals (private parts) with your normal soap.  6.  Wash thoroughly, paying special attention to the area where your surgery will be performed.  7.  Thoroughly rinse your body with warm water from the neck down.  8.  DO NOT shower/wash with your normal soap after using and rinsing off the CHG Soap.  9.  Pat yourself dry with a clean towel.            10.  Wear clean pajamas.            11.  Place clean sheets on your bed the night of your first shower and do not sleep with pets.  Day of Surgery  Do not apply any lotions/deodorants the morning of surgery.  Please wear clean clothes to the hospital/surgery center. Please read over the following fact sheets that you were given. Pain Booklet, Coughing and Deep Breathing and  Surgical Site Infection Prevention

## 2015-02-18 DIAGNOSIS — H43811 Vitreous degeneration, right eye: Secondary | ICD-10-CM | POA: Diagnosis not present

## 2015-02-18 DIAGNOSIS — H04123 Dry eye syndrome of bilateral lacrimal glands: Secondary | ICD-10-CM | POA: Diagnosis not present

## 2015-02-18 DIAGNOSIS — H43812 Vitreous degeneration, left eye: Secondary | ICD-10-CM | POA: Diagnosis not present

## 2015-02-18 DIAGNOSIS — H2513 Age-related nuclear cataract, bilateral: Secondary | ICD-10-CM | POA: Diagnosis not present

## 2015-02-18 NOTE — Progress Notes (Signed)
Anesthesia Chart Review: Patient is a 74 year old female scheduled for laparoscopic cholecystectomy on 02/21/15 by Dr. [name].   History includes non-smoker, breast cancer '00 s/p lumpectomy and radiation, HLD, GERD, depression, hypothyroidism, exertional dyspnea. PCP is Dr. Loura Pardon.   Meds include Lipitor, Lexapro, levothyroxine, fish oil, Protonix.   04/04/14 EKG: NSR, possible inferior infarct (age undetermined), ST/T wave abnormality, consider anterior ischemia. Anterior T wave abnormality has been present since at least 04/26/09. She has had intermittent Q wave in III (2011 and 2012), although not seen in 2015 tracings.   09/03/13 Nuclear stress test: Overall Impression: Normal stress nuclear study. LV Wall Motion: NL LV Function; NL Wall Motion; EF 76%. 07/21/10   09/03/13 Echo: Normal LV function, EF 0000000; grade 1 diastolic dysfunction; trivial MR and TR.  01/24/12 Carotid duplex: 0-39% BICA stenosis, antegrade vertebral artery flow.  04/04/14 CXR: IMPRESSION: 1. New bibasilar airspace disease left greater than right, with small pleural effusions. (Patient denied any acute pulmonary issues at PAT so CXR was not repeated. She had recent PCP evaluation on 02/01/15.)  Preopeative labs noted. LFTS now WNL. Cr 1.12. CBC WNL.  Unremarkable cardiac testing within the past 18 months. If not acute changes then I anticipate that she can proceed as planned.  George Hugh [facility] Short Stay Center/Anesthesiology Phone [phone] 02/18/2015 10:55 AM

## 2015-02-20 MED ORDER — CHLORHEXIDINE GLUCONATE 4 % EX LIQD: 1.0000 "application " | Freq: Once | CUTANEOUS | Status: DC

## 2015-02-20 MED ORDER — CEFOTETAN DISODIUM-DEXTROSE 2-2.08 GM-% IV SOLR
2.0000 g | INTRAVENOUS | Status: AC
  Administered 2015-02-21: 2 g via INTRAVENOUS
  Filled 2015-02-20: qty 50

## 2015-02-21 ENCOUNTER — Ambulatory Visit (HOSPITAL_COMMUNITY): Payer: Medicare HMO

## 2015-02-21 ENCOUNTER — Encounter (HOSPITAL_COMMUNITY)
Admission: RE | Disposition: A | Discharge status: Home / Self Care | Payer: Self-pay | Source: Ambulatory Visit | Attending: General Surgery

## 2015-02-21 ENCOUNTER — Encounter (HOSPITAL_COMMUNITY): Payer: Self-pay | Admitting: Certified Registered Nurse Anesthetist

## 2015-02-21 ENCOUNTER — Ambulatory Visit (HOSPITAL_COMMUNITY): Payer: Medicare HMO | Admitting: Certified Registered Nurse Anesthetist

## 2015-02-21 ENCOUNTER — Ambulatory Visit (HOSPITAL_COMMUNITY)
Admission: RE | Admit: 2015-02-21 | Discharge: 2015-02-21 | Disposition: A | Discharge status: Home / Self Care | Payer: Medicare HMO | Source: Ambulatory Visit | Attending: General Surgery | Admitting: General Surgery

## 2015-02-21 ENCOUNTER — Ambulatory Visit (HOSPITAL_COMMUNITY): Payer: Medicare HMO | Admitting: Vascular Surgery

## 2015-02-21 DIAGNOSIS — F329 Major depressive disorder, single episode, unspecified: Secondary | ICD-10-CM | POA: Insufficient documentation

## 2015-02-21 DIAGNOSIS — Z79899 Other long term (current) drug therapy: Secondary | ICD-10-CM | POA: Insufficient documentation

## 2015-02-21 DIAGNOSIS — R69 Illness, unspecified: Secondary | ICD-10-CM | POA: Diagnosis not present

## 2015-02-21 DIAGNOSIS — K219 Gastro-esophageal reflux disease without esophagitis: Secondary | ICD-10-CM | POA: Diagnosis not present

## 2015-02-21 DIAGNOSIS — K8010 Calculus of gallbladder with chronic cholecystitis without obstruction: Secondary | ICD-10-CM | POA: Insufficient documentation

## 2015-02-21 DIAGNOSIS — M1990 Unspecified osteoarthritis, unspecified site: Secondary | ICD-10-CM | POA: Insufficient documentation

## 2015-02-21 DIAGNOSIS — Z419 Encounter for procedure for purposes other than remedying health state, unspecified: Secondary | ICD-10-CM

## 2015-02-21 DIAGNOSIS — K8020 Calculus of gallbladder without cholecystitis without obstruction: Secondary | ICD-10-CM | POA: Diagnosis not present

## 2015-02-21 DIAGNOSIS — E039 Hypothyroidism, unspecified: Secondary | ICD-10-CM | POA: Insufficient documentation

## 2015-02-21 DIAGNOSIS — E7800 Pure hypercholesterolemia, unspecified: Secondary | ICD-10-CM | POA: Diagnosis not present

## 2015-02-21 HISTORY — PX: CHOLECYSTECTOMY: SHX55

## 2015-02-21 SURGERY — LAPAROSCOPIC CHOLECYSTECTOMY WITH INTRAOPERATIVE CHOLANGIOGRAM
Anesthesia: General | Site: Abdomen

## 2015-02-21 MED ORDER — EPHEDRINE SULFATE 50 MG/ML IJ SOLN
INTRAMUSCULAR | Status: AC
  Filled 2015-02-21: qty 1

## 2015-02-21 MED ORDER — KETOROLAC TROMETHAMINE 60 MG/2ML IM SOLN
INTRAMUSCULAR | Status: DC | PRN
  Administered 2015-02-21: 15 mg via INTRAMUSCULAR

## 2015-02-21 MED ORDER — OXYCODONE HCL 5 MG PO TABS: 5.0000 mg | ORAL_TABLET | ORAL | Status: DC | PRN

## 2015-02-21 MED ORDER — SUCCINYLCHOLINE CHLORIDE 20 MG/ML IJ SOLN
INTRAMUSCULAR | Status: AC
  Filled 2015-02-21: qty 1

## 2015-02-21 MED ORDER — IOHEXOL 300 MG/ML  SOLN
INTRAMUSCULAR | Status: DC | PRN
  Administered 2015-02-21: 15 mL

## 2015-02-21 MED ORDER — LIDOCAINE HCL (CARDIAC) 20 MG/ML IV SOLN
INTRAVENOUS | Status: AC
  Filled 2015-02-21: qty 5

## 2015-02-21 MED ORDER — SUGAMMADEX SODIUM 200 MG/2ML IV SOLN
INTRAVENOUS | Status: AC
  Filled 2015-02-21: qty 2

## 2015-02-21 MED ORDER — PHENYLEPHRINE 40 MCG/ML (10ML) SYRINGE FOR IV PUSH (FOR BLOOD PRESSURE SUPPORT)
PREFILLED_SYRINGE | INTRAVENOUS | Status: AC
  Filled 2015-02-21: qty 10

## 2015-02-21 MED ORDER — LACTATED RINGERS IV SOLN
Freq: Once | INTRAVENOUS | Status: AC
  Administered 2015-02-21: 08:00:00 via INTRAVENOUS

## 2015-02-21 MED ORDER — BUPIVACAINE-EPINEPHRINE 0.25% -1:200000 IJ SOLN
INTRAMUSCULAR | Status: DC | PRN
  Administered 2015-02-21: 30 mL

## 2015-02-21 MED ORDER — FENTANYL CITRATE (PF) 100 MCG/2ML IJ SOLN
INTRAMUSCULAR | Status: DC | PRN
  Administered 2015-02-21 (×2): 50 ug via INTRAVENOUS

## 2015-02-21 MED ORDER — 0.9 % SODIUM CHLORIDE (POUR BTL) OPTIME
TOPICAL | Status: DC | PRN
  Administered 2015-02-21: 1000 mL

## 2015-02-21 MED ORDER — BUPIVACAINE-EPINEPHRINE (PF) 0.25% -1:200000 IJ SOLN
INTRAMUSCULAR | Status: AC
  Filled 2015-02-21: qty 30

## 2015-02-21 MED ORDER — ROCURONIUM BROMIDE 100 MG/10ML IV SOLN
INTRAVENOUS | Status: DC | PRN
  Administered 2015-02-21: 40 mg via INTRAVENOUS

## 2015-02-21 MED ORDER — ONDANSETRON HCL 4 MG/2ML IJ SOLN
INTRAMUSCULAR | Status: DC | PRN
  Administered 2015-02-21: 4 mg via INTRAVENOUS

## 2015-02-21 MED ORDER — KETOROLAC TROMETHAMINE 30 MG/ML IJ SOLN
INTRAMUSCULAR | Status: AC
  Filled 2015-02-21: qty 1

## 2015-02-21 MED ORDER — PROPOFOL 10 MG/ML IV BOLUS
INTRAVENOUS | Status: DC | PRN
  Administered 2015-02-21: 80 mg via INTRAVENOUS

## 2015-02-21 MED ORDER — PROPOFOL 10 MG/ML IV BOLUS
INTRAVENOUS | Status: AC
  Filled 2015-02-21: qty 20

## 2015-02-21 MED ORDER — DEXAMETHASONE SODIUM PHOSPHATE 10 MG/ML IJ SOLN
INTRAMUSCULAR | Status: AC
  Filled 2015-02-21: qty 1

## 2015-02-21 MED ORDER — ROCURONIUM BROMIDE 50 MG/5ML IV SOLN
INTRAVENOUS | Status: AC
  Filled 2015-02-21: qty 1

## 2015-02-21 MED ORDER — SODIUM CHLORIDE 0.9 % IR SOLN
Status: DC | PRN
  Administered 2015-02-21: 1000 mL

## 2015-02-21 MED ORDER — ONDANSETRON HCL 4 MG/2ML IJ SOLN
INTRAMUSCULAR | Status: AC
  Filled 2015-02-21: qty 2

## 2015-02-21 MED ORDER — SUGAMMADEX SODIUM 200 MG/2ML IV SOLN
INTRAVENOUS | Status: DC | PRN
Start: 2015-02-21 — End: 2015-02-21
  Administered 2015-02-21: 200 mg via INTRAVENOUS

## 2015-02-21 MED ORDER — FENTANYL CITRATE (PF) 250 MCG/5ML IJ SOLN
INTRAMUSCULAR | Status: AC
  Filled 2015-02-21: qty 5

## 2015-02-21 MED ORDER — LACTATED RINGERS IV SOLN
INTRAVENOUS | Status: DC | PRN
  Administered 2015-02-21 (×2): via INTRAVENOUS

## 2015-02-21 MED ORDER — LIDOCAINE HCL (CARDIAC) 20 MG/ML IV SOLN
INTRAVENOUS | Status: DC | PRN
  Administered 2015-02-21: 50 mg via INTRAVENOUS

## 2015-02-21 MED ORDER — DEXAMETHASONE SODIUM PHOSPHATE 10 MG/ML IJ SOLN
INTRAMUSCULAR | Status: DC | PRN
  Administered 2015-02-21: 10 mg via INTRAVENOUS

## 2015-02-21 MED ORDER — EPHEDRINE SULFATE 50 MG/ML IJ SOLN
INTRAMUSCULAR | Status: DC | PRN
  Administered 2015-02-21: 5 mg via INTRAVENOUS

## 2015-02-21 SURGICAL SUPPLY — 52 items
APL SKNCLS STERI-STRIP NONHPOA (GAUZE/BANDAGES/DRESSINGS) ×1
APPLIER CLIP 5 13 M/L LIGAMAX5 (MISCELLANEOUS) ×2
APR CLP MED LRG 5 ANG JAW (MISCELLANEOUS) ×1
BAG RETRIEVAL 10 (BASKET) ×1
BAG SPEC RTRVL 10 TROC 200 (ENDOMECHANICALS) ×1
BANDAGE ADH SHEER 1  50/CT (GAUZE/BANDAGES/DRESSINGS) ×4 IMPLANT
BENZOIN TINCTURE PRP APPL 2/3 (GAUZE/BANDAGES/DRESSINGS) ×2 IMPLANT
BLADE SURG ROTATE 9660 (MISCELLANEOUS) IMPLANT
CANISTER SUCTION 2500CC (MISCELLANEOUS) ×2 IMPLANT
CHLORAPREP W/TINT 26ML (MISCELLANEOUS) ×2 IMPLANT
CLIP APPLIE 5 13 M/L LIGAMAX5 (MISCELLANEOUS) ×1 IMPLANT
CLSR STERI-STRIP ANTIMIC 1/2X4 (GAUZE/BANDAGES/DRESSINGS) ×1 IMPLANT
COVER MAYO STAND STRL (DRAPES) ×2 IMPLANT
COVER SURGICAL LIGHT HANDLE (MISCELLANEOUS) ×2 IMPLANT
DRAPE C-ARM 42X72 X-RAY (DRAPES) ×2 IMPLANT
DRESSING OPSITE X SMALL 2X3 (GAUZE/BANDAGES/DRESSINGS) ×1 IMPLANT
DRSG TEGADERM 4X4.75 (GAUZE/BANDAGES/DRESSINGS) ×2 IMPLANT
ELECT REM PT RETURN 9FT ADLT (ELECTROSURGICAL) ×2
ELECTRODE REM PT RTRN 9FT ADLT (ELECTROSURGICAL) ×1 IMPLANT
GAUZE SPONGE 2X2 8PLY STRL LF (GAUZE/BANDAGES/DRESSINGS) ×1 IMPLANT
GLOVE BIOGEL M STRL SZ7.5 (GLOVE) ×2 IMPLANT
GLOVE BIOGEL PI IND STRL 7.5 (GLOVE) IMPLANT
GLOVE BIOGEL PI IND STRL 8 (GLOVE) ×1 IMPLANT
GLOVE BIOGEL PI INDICATOR 7.5 (GLOVE) ×1
GLOVE BIOGEL PI INDICATOR 8 (GLOVE) ×1
GOWN STRL REUS W/ TWL LRG LVL3 (GOWN DISPOSABLE) ×3 IMPLANT
GOWN STRL REUS W/ TWL XL LVL3 (GOWN DISPOSABLE) ×1 IMPLANT
GOWN STRL REUS W/TWL LRG LVL3 (GOWN DISPOSABLE) ×6
GOWN STRL REUS W/TWL XL LVL3 (GOWN DISPOSABLE) ×2
KIT BASIN OR (CUSTOM PROCEDURE TRAY) ×2 IMPLANT
KIT ROOM TURNOVER OR (KITS) ×2 IMPLANT
LIQUID BAND (GAUZE/BANDAGES/DRESSINGS) ×1 IMPLANT
NS IRRIG 1000ML POUR BTL (IV SOLUTION) ×2 IMPLANT
PAD ARMBOARD 7.5X6 YLW CONV (MISCELLANEOUS) ×2 IMPLANT
POUCH RETRIEVAL ECOSAC 10 (ENDOMECHANICALS) ×1 IMPLANT
POUCH RETRIEVAL ECOSAC 10MM (ENDOMECHANICALS) ×1
SCISSORS LAP 5X35 DISP (ENDOMECHANICALS) ×2 IMPLANT
SET CHOLANGIOGRAPH 5 50 .035 (SET/KITS/TRAYS/PACK) ×2 IMPLANT
SET IRRIG TUBING LAPAROSCOPIC (IRRIGATION / IRRIGATOR) ×2 IMPLANT
SLEEVE ENDOPATH XCEL 5M (ENDOMECHANICALS) ×4 IMPLANT
SPECIMEN JAR SMALL (MISCELLANEOUS) ×2 IMPLANT
SPONGE GAUZE 2X2 STER 10/PKG (GAUZE/BANDAGES/DRESSINGS) ×1
STRIP CLOSURE SKIN 1/2X4 (GAUZE/BANDAGES/DRESSINGS) ×2 IMPLANT
SUT MNCRL AB 4-0 PS2 18 (SUTURE) ×2 IMPLANT
SYS BAG RETRIEVAL 10MM (BASKET) ×1
SYSTEM BAG RETRIEVAL 10MM (BASKET) IMPLANT
TOWEL OR 17X24 6PK STRL BLUE (TOWEL DISPOSABLE) ×2 IMPLANT
TOWEL OR 17X26 10 PK STRL BLUE (TOWEL DISPOSABLE) ×2 IMPLANT
TRAY LAPAROSCOPIC MC (CUSTOM PROCEDURE TRAY) ×2 IMPLANT
TROCAR XCEL BLUNT TIP 100MML (ENDOMECHANICALS) ×2 IMPLANT
TROCAR XCEL NON-BLD 5MMX100MML (ENDOMECHANICALS) ×2 IMPLANT
TUBING INSUFFLATION (TUBING) ×2 IMPLANT

## 2015-02-21 NOTE — Transfer of Care (Signed)
Immediate Anesthesia Transfer of Care Note  Patient: Frances Hoffman  Procedure(s) Performed: Procedure(s): LAPAROSCOPIC CHOLECYSTECTOMY WITH INTRAOPERATIVE CHOLANGIOGRAM (N/A)  Patient Location: PACU  Anesthesia Type:General  Level of Consciousness: awake  Airway & Oxygen Therapy: Patient Spontanous Breathing and Patient connected to nasal cannula oxygen  Post-op Assessment: Report given to RN, Post -op Vital signs reviewed and stable and Patient moving all extremities X 4  Post vital signs: stable  Last Vitals:  Filed Vitals:   02/21/15 0818  BP: 112/38  Pulse: 73  Temp: 36.9 C  Resp: 20    Complications: No apparent anesthesia complications

## 2015-02-21 NOTE — Anesthesia Procedure Notes (Signed)
Procedure Name: Intubation Date/Time: 02/21/2015 9:53 AM Performed by: Rejeana Brock L Pre-anesthesia Checklist: Patient identified, Timeout performed, Emergency Drugs available, Suction available and Patient being monitored Patient Re-evaluated:Patient Re-evaluated prior to inductionOxygen Delivery Method: Circle system utilized Preoxygenation: Pre-oxygenation with 100% oxygen Intubation Type: IV induction Ventilation: Mask ventilation without difficulty Laryngoscope Size: Mac and 3 Grade View: Grade I Tube type: Oral Tube size: 7.0 mm Number of attempts: 1 Airway Equipment and Method: Stylet Placement Confirmation: ETT inserted through vocal cords under direct vision,  positive ETCO2 and breath sounds checked- equal and bilateral Secured at: 21 cm Tube secured with: Tape Dental Injury: Teeth and Oropharynx as per pre-operative assessment

## 2015-02-21 NOTE — Op Note (Signed)
Frances Hoffman [id number] [birth year] 02/21/2015  Laparoscopic Cholecystectomy with IOC Procedure Note  Indications: This patient presents with symptomatic gallbladder disease and will undergo laparoscopic cholecystectomy. Please see note for additional details.   Pre-operative Diagnosis: symptomatic cholelithiasis  Post-operative Diagnosis: same  Surgeon: Atilano Ina   Assistants: none  Anesthesia: General endotracheal anesthesia  ASA Class: 2  Procedure Details  The patient was seen again in the Holding Room. The risks, benefits, complications, treatment options, and expected outcomes were discussed with the patient. The possibilities of reaction to medication, pulmonary aspiration, perforation of viscus, bleeding, recurrent infection, finding a normal gallbladder, the need for additional procedures, failure to diagnose a condition, the possible need to convert to an open procedure, and creating a complication requiring transfusion or operation were discussed with the patient. The likelihood of improving the patient's symptoms with return to their baseline status is good.  The patient and/or family concurred with the proposed plan, giving informed consent. The site of surgery properly noted. The patient was taken to Operating Room, identified as Frances Hoffman and the procedure verified as Laparoscopic Cholecystectomy with Intraoperative Cholangiogram. A Time Out was held and the above information confirmed. Antibiotic prophylaxis was administered.   Prior to the induction of general anesthesia, antibiotic prophylaxis was administered. General endotracheal anesthesia was then administered and tolerated well. After the induction, the abdomen was prepped with Chloraprep and draped in the sterile fashion. The patient was positioned in the supine position.  Local anesthetic agent was injected into the skin near the umbilicus and an incision made. We dissected down to the abdominal fascia  with blunt dissection.  The fascia was incised vertically and we entered the peritoneal cavity bluntly.  A pursestring suture of 0-Vicryl was placed around the fascial opening.  The Hasson cannula was inserted and secured with the stay suture.  Pneumoperitoneum was then created with CO2 and tolerated well without any adverse changes in the patient's vital signs. An 5-mm port was placed in the subxiphoid position.  Two 5-mm ports were placed in the right upper quadrant. All skin incisions were infiltrated with a local anesthetic agent before making the incision and placing the trocars.   We positioned the patient in reverse Trendelenburg, tilted slightly to the patient's left.  The gallbladder was identified, the fundus grasped and retracted cephalad. Adhesions were lysed bluntly and with the electrocautery where indicated, taking care not to injure any adjacent organs or viscus. The infundibulum was grasped and retracted laterally, exposing the peritoneum overlying the triangle of Calot. This was then divided and exposed in a blunt fashion. A critical view of the cystic duct and cystic artery was obtained.  The cystic duct was clearly identified and bluntly dissected circumferentially. The cystic duct was ligated with a clip distally.   An incision was made in the cystic duct and the [facility] cholangiogram catheter introduced. The catheter was secured using a clip. A cholangiogram was then obtained which showed good visualization of the distal and proximal biliary tree with no sign of filling defects or obstruction.  Contrast flowed easily into the duodenum. The catheter was then removed.   The cystic duct was then ligated with clips and divided. The cystic artery which had been identified and dissected free was ligated with clips and divided as well.   The gallbladder was dissected from the liver bed in retrograde fashion with the electrocautery. The gallbladder was removed and placed in an Ecco sac.  The  gallbladder and Ecco sac were then  removed through the umbilical port site. The liver bed was irrigated and inspected. Hemostasis was achieved with the electrocautery. Copious irrigation was utilized and was repeatedly aspirated until clear.  The pursestring suture was used to close the umbilical fascia.    We again inspected the right upper quadrant for hemostasis.  The umbilical closure was inspected and there was no air leak and nothing trapped within the closure. Pneumoperitoneum was released as we removed the trocars.  4-0 Monocryl was used to close the skin.   Benzoin, steri-strips, and clean dressings were applied. The patient was then extubated and brought to the recovery room in stable condition. Instrument, sponge, and needle counts were correct at closure and at the conclusion of the case.   Findings:  Cholelithiasis; + critical view  Estimated Blood Loss: Minimal         Drains: none         Specimens: Gallbladder           Complications: None; patient tolerated the procedure well.         Disposition: PACU - hemodynamically stable.         Condition: stable  [name]. Andrey Campanile, MD, FACS General, Bariatric, & Minimally Invasive Surgery [facility] Surgery, [place]

## 2015-02-21 NOTE — Anesthesia Postprocedure Evaluation (Signed)
Anesthesia Post Note  Patient: Frances Hoffman  Procedure(s) Performed: Procedure(s) (LRB): LAPAROSCOPIC CHOLECYSTECTOMY WITH INTRAOPERATIVE CHOLANGIOGRAM (N/A)  Patient location during evaluation: PACU Anesthesia Type: General Level of consciousness: awake and alert Pain management: pain level controlled Vital Signs Assessment: post-procedure vital signs reviewed and stable Respiratory status: spontaneous breathing, nonlabored ventilation, respiratory function stable and patient connected to nasal cannula oxygen Cardiovascular status: blood pressure returned to baseline and stable Postop Assessment: no signs of nausea or vomiting Anesthetic complications: no    Last Vitals:  Filed Vitals:   02/21/15 1130 02/21/15 1135  BP:  113/69  Pulse: 97 96  Temp:    Resp: 24 20    Last Pain: There were no vitals filed for this visit.               Jaiah Weigel,W. EDMOND

## 2015-02-21 NOTE — Interval H&P Note (Signed)
History and Physical Interval Note:  02/21/2015 9:18 AM  Frances Hoffman  has presented today for surgery, with the diagnosis of symptomaic cholelithiasis  The various methods of treatment have been discussed with the patient and family. After consideration of risks, benefits and other options for treatment, the patient has consented to  Procedure(s): LAPAROSCOPIC CHOLECYSTECTOMY WITH INTRAOPERATIVE CHOLANGIOGRAM (N/A) as a surgical intervention .  The patient's history has been reviewed, patient examined, no change in status, stable for surgery.  I have reviewed the patient's chart and labs.  Questions were answered to the patient's satisfaction.    [name]. [name], MD, [place], Bariatric, & Minimally Invasive Surgery [facility] Surgery, [place]   [facility]

## 2015-02-21 NOTE — Discharge Instructions (Signed)
[facility] °LAPAROSCOPIC SURGERY: POST OP INSTRUCTIONS °Always review your discharge instruction sheet given to you by the facility where your surgery was performed. °IF YOU HAVE DISABILITY OR FAMILY LEAVE FORMS, YOU MUST BRING THEM TO THE OFFICE FOR PROCESSING.   °DO NOT GIVE THEM TO YOUR DOCTOR. ° °1. A prescription for pain medication may be given to you upon discharge.  Take your pain medication as prescribed, if needed.  If narcotic pain medicine is not needed, then you may take acetaminophen (Tylenol) or ibuprofen (Advil) as needed. °2. Take your usually prescribed medications unless otherwise directed. °3. If you need a refill on your pain medication, please contact your pharmacy.  They will contact our office to request authorization. Prescriptions will not be filled after 5pm or on week-ends. °4. You should follow a light diet the first few days after arrival home, such as soup and crackers, etc.  Be sure to include lots of fluids daily. °5. Most patients will experience some swelling and bruising in the area of the incisions.  Ice packs will help.  Swelling and bruising can take several days to resolve.  °6. It is common to experience some constipation if taking pain medication after surgery.  Increasing fluid intake and taking a stool softener (such as Colace) will usually help or prevent this problem from occurring.  A mild laxative (Milk of Magnesia or Miralax) should be taken according to package instructions if there are no bowel movements after 48 hours. °7. Unless discharge instructions indicate otherwise, you may remove your bandages 24-48 hours after surgery, and you may shower at that time.  You may have steri-strips (small skin tapes) in place directly over the incision.  These strips should be left on the skin for 7-10 days.  If your surgeon used skin glue on the incision, you may shower in 24 hours.  The glue will flake off over the next 2-3 weeks.  Any sutures or  staples will be removed at the office during your follow-up visit. °8. ACTIVITIES:  You may resume regular (light) daily activities beginning the next day--such as daily self-care, walking, climbing stairs--gradually increasing activities as tolerated.  You may have sexual intercourse when it is comfortable.  Refrain from any heavy lifting or straining until approved by your doctor. °a. You may drive when you are no longer taking prescription pain medication, you can comfortably wear a seatbelt, and you can safely maneuver your car and apply brakes. °9. You should see your doctor in the office for a follow-up appointment approximately 2-3 weeks after your surgery.  Make sure that you call for this appointment within a day or two after you arrive home to insure a convenient appointment time. °10. OTHER INSTRUCTIONS:  °WHEN TO CALL YOUR DOCTOR: °1. Fever over 101.0 °2. Inability to urinate °3. Continued bleeding from incision. °4. Increased pain, redness, or drainage from the incision. °5. Increasing abdominal pain ° °The clinic staff is available to answer your questions during regular business hours.  Please don’t hesitate to call and ask to speak to one of the nurses for clinical concerns.  If you have a medical emergency, go to the nearest emergency room or call 911.  A surgeon from [facility] is always on call at the hospital. °[address], [address], [place], [place]  [address] ? [address] [address], [place], [place]   [address] °[phone] ? [phone] ? FAX [phone] °Web site: [email or web address] ° °

## 2015-02-21 NOTE — H&P (Signed)
Frances Hoffman 02/03/2015 2:21 PM Location: [facility] Patient #: [id number] DOB: [birth year] Divorced / Language: [place] / Race: White Female  History of Present Illness Frances Hoffman M. Frances Schoen MD; 02/21/2015 9:12 AM) Patient words: GALLBLADDER.  The patient is a 74 year old female who presents for evaluation of gall stones. 74 yo female referred by Dr Frances Hoffman for evaluation of gallbladder problems. She reports an episode of epigastric pain/soreness that lasted several hours. she also reports some bloating at that time but no fever, chills, nausea or vomiting. An u/s showed gallstone within the neck of the gallbladder but no cholecystitis. she was in the ED. she had some LFT elevation on 11/21 AST, ALT, AP were 162, 118, 128. On 11/29, Ap was 132, ALT 61.   Problem List/Past Medical Frances Hoffman Frances Clan, MD; 02/21/2015 9:12 AM) SYMPTOMATIC CHOLELITHIASIS [phone])  Other Problems Frances Ina, MD; 02/21/2015 9:12 AM) Back Pain Breast Cancer Cholelithiasis Gastroesophageal Reflux Disease Hypercholesterolemia Thyroid Disease  Past Surgical History Fay Records, CMA; 02/03/2015 2:21 PM) Breast Biopsy Left. Breast Mass; Local Excision Left.  Diagnostic Studies History Fay Records, [place]; 02/03/2015 2:21 PM) Colonoscopy 5-10 years ago Mammogram >3 years ago  Allergies Fay Records, CMA; 02/03/2015 2:22 PM) Ciprofloxacin HCl *FLUOROQUINOLONES* Citalopram Hydrobromide *ANTIDEPRESSANTS* Flagyl *ANTI-INFECTIVE AGENTS - MISC.*  Medication History Fay Records, CMA; 02/03/2015 2:23 PM) Atorvastatin Calcium (20MG  Tablet, Oral) Active. Levothyroxine Sodium ( Tablet, Oral) Active. Pantoprazole Sodium (40MG  Tablet DR, Oral) Active. Carboxymethylcellul-Glycerin (0.5-0.9% Solution, Ophthalmic) Active. Lexapro (20MG  Tablet, Oral) Active. Multiple Vitamins (Oral) Active. Omega-3 Fish Oil (500MG  Capsule, Oral) Active. Vitamin C (500MG  Tablet, Oral) Active. Vitamin B-1  (100MG  Tablet, Oral) Active. Medications Reconciled  Social History Fay Records, [place]; 02/03/2015 2:21 PM) Caffeine use Carbonated beverages. No alcohol use No drug use Tobacco use Never smoker.  Family History Fay Records, [place]; 02/03/2015 2:21 PM) Arthritis Mother. Cervical Cancer Sister. Diabetes Mellitus Sister. Heart Disease Sister. Heart disease in female family member before age 106  Pregnancy / Birth History Fay Records, CMA; 02/03/2015 2:21 PM) Age at menarche 13 years. Age of menopause 9-50 Gravida 2 Irregular periods Maternal age 16-25 Para 2     Review of Systems Fay Records CMA; 02/03/2015 2:21 PM) General Not Present- Appetite Loss, Chills, Fatigue, Fever, Night Sweats, Weight Gain and Weight Loss. HEENT Present- Wears glasses/contact lenses. Not Present- Earache, Hearing Loss, Hoarseness, Nose Bleed, Oral Ulcers, Ringing in the Ears, Seasonal Allergies, Sinus Pain, Sore Throat, Visual Disturbances and Yellow Eyes. Respiratory Not Present- Bloody sputum, Chronic Cough, Difficulty Breathing, Snoring and Wheezing. Breast Present- Breast Mass. Not Present- Breast Pain, Nipple Discharge and Skin Changes. Cardiovascular Not Present- Chest Pain, Difficulty Breathing Lying Down, Leg Cramps, Palpitations, Rapid Heart Rate, Shortness of Breath and Swelling of Extremities. Gastrointestinal Present- Abdominal Pain, Bloating and Indigestion. Not Present- Bloody Stool, Change in Bowel Habits, Chronic diarrhea, Constipation, Difficulty Swallowing, Excessive gas, Gets full quickly at meals, Hemorrhoids, Nausea, Rectal Pain and Vomiting. Female Genitourinary Present- Nocturia. Not Present- Frequency, Painful Urination, Pelvic Pain and Urgency. Musculoskeletal Present- Back Pain and Joint Pain. Not Present- Joint Stiffness, Muscle Pain, Muscle Weakness and Swelling of Extremities. Neurological Present- Decreased Memory. Not Present- Fainting, Headaches, Numbness,  Seizures, Tingling, Tremor, Trouble walking and Weakness. Psychiatric Not Present- Anxiety, Bipolar, Change in Sleep Pattern, Depression, Fearful and Frequent crying. Endocrine Not Present- Cold Intolerance, Excessive Hunger, Hair Changes, Heat Intolerance, Hot flashes and New Diabetes. Hematology Not Present- Easy Bruising, Excessive bleeding, Gland problems, HIV and Persistent Infections.  Vitals Morrie Sheldon  Beck CMA; 02/03/2015 2:24 PM) 02/03/2015 2:23 PM Weight: 168 lb Height: 64in Body Surface Area: 1.82 m Body Mass Index: 28.84 kg/m  Temp.: 97.72F(Temporal)  Pulse: 60 (Regular)  BP: 120/60 (Sitting, Left Arm, Standard)      Physical Exam Frances Hoffman M. Drewey Begue MD; 02/21/2015 9:03 AM)  General Mental Status-Alert. General Appearance-Consistent with stated age. Hydration-Well hydrated. Voice-Normal.  Head and Neck Head-normocephalic, atraumatic with no lesions or palpable masses. Trachea-midline. Thyroid Gland Characteristics - normal size and consistency.  Eye Eyeball - Bilateral-Extraocular movements intact. Sclera/Conjunctiva - Bilateral-No scleral icterus.  Chest and Lung Exam Chest and lung exam reveals -quiet, even and easy respiratory effort with no use of accessory muscles and on auscultation, normal breath sounds, no adventitious sounds and normal vocal resonance. Inspection Chest Wall - Normal. Back - normal.  Breast - Did not examine.  Cardiovascular Cardiovascular examination reveals -normal heart sounds, regular rate and rhythm with no murmurs and normal pedal pulses bilaterally.  Abdomen Inspection Inspection of the abdomen reveals - No Hernias. Skin - Scar - no surgical scars. Palpation/Percussion Palpation and Percussion of the abdomen reveal - Soft, Non Tender, No Rebound tenderness, No Rigidity (guarding) and No hepatosplenomegaly. Auscultation Auscultation of the abdomen reveals - Bowel sounds normal.  Peripheral  Vascular Upper Extremity Palpation - Pulses bilaterally normal.  Neurologic Neurologic evaluation reveals -alert and oriented x 3 with no impairment of recent or remote memory. Mental Status-Normal.  Neuropsychiatric The patient's mood and affect are described as -normal. Judgment and Insight-insight is appropriate concerning matters relevant to self.  Musculoskeletal Normal Exam - Left-Upper Extremity Strength Normal and Lower Extremity Strength Normal. Normal Exam - Right-Upper Extremity Strength Normal and Lower Extremity Strength Normal.  Lymphatic Head & Neck  General Head & Neck Lymphatics: Bilateral - Description - Normal. Axillary - Did not examine. Femoral & Inguinal - Did not examine.    Assessment & Plan Frances Hoffman M. Sotero Brinkmeyer MD; 02/21/2015 9:12 AM)  SYMPTOMATIC CHOLELITHIASIS (K80.20) Impression: I believe the patient's symptoms are consistent with gallbladder disease.  We discussed gallbladder disease. The patient was given Agricultural engineer. We discussed non-operative and operative management. We discussed the signs & symptoms of acute cholecystitis  I discussed laparoscopic cholecystectomy with IOC in detail. The patient was given educational material as well as diagrams detailing the procedure. We discussed the risks and benefits of a laparoscopic cholecystectomy including, but not limited to bleeding, infection, injury to surrounding structures such as the intestine or liver, bile leak, retained gallstones, need to convert to an open procedure, prolonged diarrhea, blood clots such as DVT, common bile duct injury, anesthesia risks, and possible need for additional procedures. We discussed the typical post-operative recovery course. I explained that the likelihood of improvement of their symptoms is good.  The patient has elected to proceed with surgery.  Current Plans Pt Education - Pamphlet Given - Laparoscopic Gallbladder Surgery: discussed with patient  and provided information. You are being scheduled for surgery - Our schedulers will call you.  You should hear from our office's scheduling department within 5 working days about the location, date, and time of surgery. We try to make accommodations for patient's preferences in scheduling surgery, but sometimes the OR schedule or the surgeon's schedule prevents [place] from making those accommodations.  If you have not heard from our office [phone]) in 5 working days, call the office and ask for your surgeon's nurse.  If you have other questions about your diagnosis, plan, or surgery, call the office and ask for  your surgeon's nurse. [name]. Andrey Campanile, MD, FACS General, Bariatric, & Minimally Invasive Surgery [facility], [place]

## 2015-02-21 NOTE — Anesthesia Preprocedure Evaluation (Addendum)
Anesthesia Evaluation  Patient identified by MRN, date of birth, ID band Patient awake    Reviewed: Allergy & Precautions, H&P , NPO status , Patient's Chart, lab work & pertinent test results  Airway Mallampati: III  TM Distance: >3 FB Neck ROM: Full    Dental no notable dental hx. (+) Teeth Intact, Dental Advisory Given   Pulmonary neg pulmonary ROS,    Pulmonary exam normal breath sounds clear to auscultation       Cardiovascular negative cardio ROS   Rhythm:Regular Rate:Normal     Neuro/Psych Depression negative neurological ROS     GI/Hepatic Neg liver ROS, GERD  Medicated and Controlled,  Endo/Other  Hypothyroidism   Renal/GU negative Renal ROS  negative genitourinary   Musculoskeletal  (+) Arthritis , Osteoarthritis,    Abdominal   Peds  Hematology negative hematology ROS (+)   Anesthesia Other Findings   Reproductive/Obstetrics negative OB ROS                            Anesthesia Physical Anesthesia Plan  ASA: II  Anesthesia Plan: General   Post-op Pain Management:    Induction: Intravenous  Airway Management Planned: Oral ETT  Additional Equipment:   Intra-op Plan:   Post-operative Plan: Extubation in OR  Informed Consent: I have reviewed the patients History and Physical, chart, labs and discussed the procedure including the risks, benefits and alternatives for the proposed anesthesia with the patient or authorized representative who has indicated his/her understanding and acceptance.   Dental advisory given  Plan Discussed with: CRNA  Anesthesia Plan Comments:         Anesthesia Quick Evaluation

## 2015-02-22 ENCOUNTER — Encounter (HOSPITAL_COMMUNITY): Payer: Self-pay | Admitting: General Surgery

## 2015-03-04 ENCOUNTER — Ambulatory Visit
Admission: RE | Admit: 2015-03-04 | Discharge: 2015-03-04 | Disposition: A | Discharge status: Home / Self Care | Payer: Medicare HMO | Source: Ambulatory Visit

## 2015-03-04 DIAGNOSIS — Z1231 Encounter for screening mammogram for malignant neoplasm of breast: Secondary | ICD-10-CM

## 2015-03-10 ENCOUNTER — Encounter: Payer: Self-pay | Admitting: *Deleted

## 2015-03-15 ENCOUNTER — Encounter: Payer: Self-pay | Admitting: Family Medicine

## 2015-03-15 ENCOUNTER — Ambulatory Visit (INDEPENDENT_AMBULATORY_CARE_PROVIDER_SITE_OTHER): Payer: Medicare HMO | Admitting: Family Medicine

## 2015-03-15 VITALS — BP 110/64 | HR 66 | Temp 97.6°F | Ht 64.0 in | Wt 169.2 lb

## 2015-03-15 DIAGNOSIS — N390 Urinary tract infection, site not specified: Secondary | ICD-10-CM | POA: Diagnosis not present

## 2015-03-15 DIAGNOSIS — R300 Dysuria: Secondary | ICD-10-CM | POA: Diagnosis not present

## 2015-03-15 DIAGNOSIS — E663 Overweight: Secondary | ICD-10-CM | POA: Diagnosis not present

## 2015-03-15 LAB — POC URINALSYSI DIPSTICK (AUTOMATED)
BILIRUBIN UA: NEGATIVE
GLUCOSE UA: NEGATIVE
Ketones, UA: NEGATIVE
NITRITE UA: NEGATIVE
Protein, UA: 15
Spec Grav, UA: 1.015
UROBILINOGEN UA: 0.2
pH, UA: 7.5

## 2015-03-15 MED ORDER — SULFAMETHOXAZOLE-TRIMETHOPRIM 800-160 MG PO TABS: 1.0000 | ORAL_TABLET | Freq: Two times a day (BID) | ORAL | Status: DC

## 2015-03-15 NOTE — Patient Instructions (Addendum)
For weight loss  No more sugar drinks  Look at the [facility] eating plan Also consider an accountability program like weight watchers or the app "myfitnesspal" Exercise 5 days per week for 30 minutes or more   Take bactrim for uti  Drink lots of water  We will culture urine and update you

## 2015-03-15 NOTE — Progress Notes (Signed)
Pre visit review using our clinic review tool, if applicable. No additional management support is needed unless otherwise documented below in the visit note. 

## 2015-03-15 NOTE — Progress Notes (Signed)
Subjective:    Patient ID: Frances Hoffman, female    DOB: 10/04/1940, 75 y.o.   MRN: [id number]  HPI Here for uti symptoms   Bladder pain  Dysuria  No blood in urine -is dark however  Tries to drink lot of water  No nausea or flank pain  No fever  Frequency and urgency   This episode started today/this am    Results for orders placed or performed in visit on 03/15/15  POCT Urinalysis Dipstick (Automated)  Result Value Ref Range   Color, UA Yellow    Clarity, UA Cloudy    Glucose, UA Neg.    Bilirubin, UA Neg.    Ketones, UA Neg.    Spec Grav, UA 1.015    Blood, UA Trace    pH, UA 7.5    Protein, UA 15 mg/dL    Urobilinogen, UA 0.2    Nitrite, UA Neg.    Leukocytes, UA moderate (2+) (A) Negative    Wants to loose weight  Wants suggestions re: how to do that  Goes to the gym 3 d per week - does silver sneakers and it is a good program     Patient Active Problem List   Diagnosis Date Noted  . Overweight 03/15/2015  . Dysuria 02/01/2015  . Cholelithiasis 01/26/2015  . Abdominal pain, epigastric 01/24/2015  . Memory loss 09/22/2014  . Exercise intolerance 07/30/2014  . Estrogen deficiency 04/28/2014  . Tachycardia 04/03/2014  . Family history of heart disease 08/27/2013  . Dyspnea on exertion 08/18/2013  . Positional vertigo 08/18/2013  . Abdominal pain, left lower quadrant 09/09/2012  . Abnormal x-ray of neck 01/11/2012  . Encounter for Medicare annual wellness exam 11/23/2011  . Other screening mammogram 11/23/2011  . Insomnia 01/16/2011  . Osteopenia 11/26/2008  . NEOPLASM, MALIGNANT, BREAST, HX OF 07/01/2008  . Recurrent UTI 01/15/2008  . WEIGHT GAIN 01/15/2008  . Hypothyroidism 12/12/2007  . Hyperlipidemia 12/12/2007  . Adjustment disorder with mixed anxiety and depressed mood 12/12/2007  . GERD 12/12/2007  . ADHESIVE CAPSULITIS 12/12/2007   Past Medical History  Diagnosis Date  . Cancer (HCC) 2000    breast had lumpectomy/radiation x36,mammo  ,no chemo  . Depression   . GERD (gastroesophageal reflux disease)   . Hyperlipidemia   . Hypothyroid   . Frozen shoulder   . Osteopenia     BMD 2004, WNL 2008  . Shingles     chronic body pain, left side of body  . Dysuria-frequency syndrome     takes AZO  . Zoster     Hx of  . Hypothyroid   . Gall stones   . Recurrent UTI   . Wears glasses   . Cataract of both eyes   . Pneumonia   . Shortness of breath dyspnea     when climbing stairs only   Past Surgical History  Procedure Laterality Date  . Breast surgery  2000    lumpectomy  . Colonoscopy    . Multiple tooth extractions    . Cholecystectomy N/A 02/21/2015    Procedure: LAPAROSCOPIC CHOLECYSTECTOMY WITH INTRAOPERATIVE CHOLANGIOGRAM;  Surgeon: Gaynelle Adu, MD;  Location[facility] OR;  Service: General;  Laterality: N/A;   Social History  Substance Use Topics  . Smoking status: Never Smoker   . Smokeless tobacco: Never Used  . Alcohol Use: No   Family History  Problem Relation Age of Onset  . Osteoporosis Mother   . Parkinsonism Father   . Cancer Sister  breast CA  . Cancer Other     breast CA  . Heart disease Maternal Grandmother   . Heart disease Maternal Grandfather   . Coronary artery disease Sister    Allergies  Allergen Reactions  . Ciprofloxacin Other (See Comments)    Unk  . Citalopram Hydrobromide     Intrusive/ weird thoughts   . Flagyl [Metronidazole]   . Oxycodone Other (See Comments)    Headaches   Current Outpatient Prescriptions on File Prior to Visit  Medication Sig Dispense Refill  . atorvastatin (LIPITOR) 20 MG tablet Take 1 tablet (20 mg total) by mouth daily. 90 tablet 3  . b complex vitamins tablet Take 1 tablet by mouth daily.    . calcium carbonate (OS-CAL - DOSED IN MG OF ELEMENTAL CALCIUM) 1250 (500 CA) MG tablet Take 1 tablet by mouth daily.    . Cholecalciferol (VITAMIN D3) 2000 UNITS capsule Take 2,000 Units by mouth daily.      [name] escitalopram (LEXAPRO) 20 MG tablet Take 1  tablet (20 mg total) by mouth daily. 30 tablet 11  . levothyroxine (SYNTHROID, LEVOTHROID) 50 MCG tablet TAKE ONE TABLET BY MOUTH ONCE DAILY 30 tablet 2  . Multiple Vitamin (MULTIVITAMIN) tablet Take 1 tablet by mouth daily.      . Omega-3 Fatty Acids (FISH OIL PO) Take 1 capsule by mouth daily.    . pantoprazole (PROTONIX) 40 MG tablet Take 1 tablet (40 mg total) by mouth 2 (two) times daily before a meal. 60 tablet 1  . vitamin C (ASCORBIC ACID) 500 MG tablet Take 500 mg by mouth daily.    . [DISCONTINUED] Diphenhydramine-APAP, sleep, (TYLENOL PM EXTRA STRENGTH PO) Take by mouth as needed.       No current facility-administered medications on file prior to visit.    Review of Systems Review of Systems  Constitutional: Negative for fever, appetite change, fatigue and unexpected weight change.  Eyes: Negative for pain and visual disturbance.  Respiratory: Negative for cough and shortness of breath.   Cardiovascular: Negative for cp or palpitations    Gastrointestinal: Negative for nausea, diarrhea and constipation.  Genitourinary: pos for urgency and frequency. neg for hematuria or flank pain  Skin: Negative for pallor or rash   Neurological: Negative for weakness, light-headedness, numbness and headaches.  Hematological: Negative for adenopathy. Does not bruise/bleed easily.  Psychiatric/Behavioral: Negative for dysphoric mood. The patient is not nervous/anxious.         Objective:   Physical Exam  Constitutional: She appears well-developed and well-nourished. No distress.  Overweight and well appearing   HENT:  Head: Normocephalic and atraumatic.  Eyes: Conjunctivae and EOM are normal. Pupils are equal, round, and reactive to light.  Neck: Normal range of motion. Neck supple.  Cardiovascular: Normal rate, regular rhythm and normal heart sounds.   Pulmonary/Chest: Effort normal and breath sounds normal.  Abdominal: Soft. Bowel sounds are normal. She exhibits no distension. There  is tenderness. There is no rebound.  No cva tenderness  Mild suprapubic tenderness  Musculoskeletal: She exhibits no edema.  Lymphadenopathy:    She has no cervical adenopathy.  Neurological: She is alert.  Skin: No rash noted.  Psychiatric: She has a normal mood and affect.          Assessment & Plan:   Problem List Items Addressed This Visit      Genitourinary   Recurrent UTI - Primary    Pos ua Last pos cx about a year ago Enc water  intake tx with bactrim  Pending urine cx  Update if not starting to improve in a week or if worsening        Relevant Medications   sulfamethoxazole-trimethoprim (BACTRIM DS,SEPTRA DS) 800-160 MG tablet   Other Relevant Orders   Urine culture     Other   Dysuria   Relevant Orders   POCT Urinalysis Dipstick (Automated) (Completed)   Overweight    For weight loss effort No more sugar drinks  Look at the [facility] eating plan Also consider an accountability program like weight watchers or the app "myfitnesspal" Exercise 5 days per week for 30 minutes or more

## 2015-03-16 NOTE — Assessment & Plan Note (Addendum)
Pos ua Last pos cx about a year ago Enc water intake tx with bactrim  Pending urine cx  Update if not starting to improve in a week or if worsening

## 2015-03-16 NOTE — Assessment & Plan Note (Signed)
For weight loss effort No more sugar drinks  Look at the [facility] eating plan Also consider an accountability program like weight watchers or the app "myfitnesspal" Exercise 5 days per week for 30 minutes or more

## 2015-03-18 LAB — URINE CULTURE

## 2015-03-23 DIAGNOSIS — H43811 Vitreous degeneration, right eye: Secondary | ICD-10-CM | POA: Diagnosis not present

## 2015-03-23 DIAGNOSIS — H43812 Vitreous degeneration, left eye: Secondary | ICD-10-CM | POA: Diagnosis not present

## 2015-04-24 ENCOUNTER — Telehealth: Payer: Self-pay | Admitting: Family Medicine

## 2015-04-24 DIAGNOSIS — Z0000 Encounter for general adult medical examination without abnormal findings: Secondary | ICD-10-CM | POA: Insufficient documentation

## 2015-04-24 DIAGNOSIS — E785 Hyperlipidemia, unspecified: Secondary | ICD-10-CM

## 2015-04-24 DIAGNOSIS — E039 Hypothyroidism, unspecified: Secondary | ICD-10-CM

## 2015-04-24 NOTE — Telephone Encounter (Signed)
-----   Message from Marchia Bond sent at 04/19/2015  1:46 PM EST ----- Regarding: Cpx labs Mon 2/20, need orders. Thanks! :-) Please order  future cpx labs for pt's upcoming lab appt. Thanks Aniceto Boss

## 2015-04-25 ENCOUNTER — Other Ambulatory Visit (INDEPENDENT_AMBULATORY_CARE_PROVIDER_SITE_OTHER): Payer: Medicare HMO

## 2015-04-25 DIAGNOSIS — E039 Hypothyroidism, unspecified: Secondary | ICD-10-CM | POA: Diagnosis not present

## 2015-04-25 DIAGNOSIS — E785 Hyperlipidemia, unspecified: Secondary | ICD-10-CM | POA: Diagnosis not present

## 2015-04-25 DIAGNOSIS — Z0000 Encounter for general adult medical examination without abnormal findings: Secondary | ICD-10-CM

## 2015-04-25 LAB — CBC WITH DIFFERENTIAL/PLATELET
BASOS ABS: 0 10*3/uL (ref 0.0–0.1)
Basophils Relative: 0.5 % (ref 0.0–3.0)
EOS ABS: 0.1 10*3/uL (ref 0.0–0.7)
Eosinophils Relative: 1.1 % (ref 0.0–5.0)
HCT: 41.3 % (ref 36.0–46.0)
Hemoglobin: 13.8 g/dL (ref 12.0–15.0)
LYMPHS ABS: 2.2 10*3/uL (ref 0.7–4.0)
Lymphocytes Relative: 26.6 % (ref 12.0–46.0)
MCHC: 33.5 g/dL (ref 30.0–36.0)
MCV: 93.6 fl (ref 78.0–100.0)
MONO ABS: 0.8 10*3/uL (ref 0.1–1.0)
Monocytes Relative: 9.4 % (ref 3.0–12.0)
NEUTROS ABS: 5.1 10*3/uL (ref 1.4–7.7)
NEUTROS PCT: 62.4 % (ref 43.0–77.0)
PLATELETS: 257 10*3/uL (ref 150.0–400.0)
RBC: 4.41 Mil/uL (ref 3.87–5.11)
RDW: 15.3 % (ref 11.5–15.5)
WBC: 8.2 10*3/uL (ref 4.0–10.5)

## 2015-04-25 LAB — COMPREHENSIVE METABOLIC PANEL
ALT: 16 U/L (ref 0–35)
AST: 20 U/L (ref 0–37)
Albumin: 4 g/dL (ref 3.5–5.2)
Alkaline Phosphatase: 96 U/L (ref 39–117)
BILIRUBIN TOTAL: 0.5 mg/dL (ref 0.2–1.2)
BUN: 23 mg/dL (ref 6–23)
CO2: 31 meq/L (ref 19–32)
Calcium: 10.2 mg/dL (ref 8.4–10.5)
Chloride: 104 mEq/L (ref 96–112)
Creatinine, Ser: 1.14 mg/dL (ref 0.40–1.20)
GFR: 49.44 mL/min — AB (ref >60.00)
GLUCOSE: 92 mg/dL (ref 70–99)
Potassium: 4.4 mEq/L (ref 3.5–5.1)
SODIUM: 141 meq/L (ref 135–145)
Total Protein: 6.9 g/dL (ref 6.0–8.3)

## 2015-04-25 LAB — LIPID PANEL
CHOL/HDL RATIO: 4
Cholesterol: 182 mg/dL (ref 0–200)
HDL: 44.3 mg/dL (ref >39.00)
LDL Cholesterol: 109 mg/dL — ABNORMAL HIGH (ref 0–99)
NONHDL: 137.9
Triglycerides: 146 mg/dL (ref 0.0–149.0)
VLDL: 29.2 mg/dL (ref 0.0–40.0)

## 2015-04-25 LAB — TSH: TSH: 8.36 u[IU]/mL — AB (ref 0.35–4.50)

## 2015-05-02 ENCOUNTER — Encounter: Payer: Self-pay | Admitting: Family Medicine

## 2015-05-02 ENCOUNTER — Ambulatory Visit (INDEPENDENT_AMBULATORY_CARE_PROVIDER_SITE_OTHER): Payer: Medicare HMO | Admitting: Family Medicine

## 2015-05-02 VITALS — BP 108/68 | HR 75 | Temp 97.6°F | Ht 63.0 in | Wt 167.2 lb

## 2015-05-02 DIAGNOSIS — M8580 Other specified disorders of bone density and structure, unspecified site: Secondary | ICD-10-CM | POA: Diagnosis not present

## 2015-05-02 DIAGNOSIS — Z0000 Encounter for general adult medical examination without abnormal findings: Secondary | ICD-10-CM

## 2015-05-02 DIAGNOSIS — E039 Hypothyroidism, unspecified: Secondary | ICD-10-CM | POA: Diagnosis not present

## 2015-05-02 DIAGNOSIS — E785 Hyperlipidemia, unspecified: Secondary | ICD-10-CM

## 2015-05-02 MED ORDER — LEVOTHYROXINE SODIUM 75 MCG PO TABS: 75.0000 ug | ORAL_TABLET | Freq: Every day | ORAL | Status: DC

## 2015-05-02 NOTE — Progress Notes (Signed)
Subjective:    Patient ID: Frances Hoffman, female    DOB: 1941/01/31, 75 y.o.   MRN: [id number]  HPI Here for annual medicare wellness visit as well as chronic/acute medical problems as well as annual preventative visit   I have personally reviewed the Medicare Annual Wellness questionnaire and have noted 1. The patient's medical and social history 2. Their use of alcohol, tobacco or illicit drugs 3. Their current medications and supplements 4. The patient's functional ability including ADL's, fall risks, home safety risks and hearing or visual             impairment. 5. Diet and physical activities 6. Evidence for depression or mood disorders  The patients weight, height, BMI have been recorded in the chart and visual acuity is per eye clinic.  I have made referrals, counseling and provided education to the patient based review of the above and I have provided the pt with a written personalized care plan for preventive services. Reviewed and updated provider list, see scanned forms.  See scanned forms.  Routine anticipatory guidance given to patient.  See health maintenance. Colon cancer screening 1/10 - 10 year recall if that is still recommended Breast cancer screening 12/16 - normal  Self breast exam-no lumps or changes (does not always do self exam)  Flu vaccine 9/16 Tetanus vaccine 1/08  Pneumovax complete on both vaccines  Zoster vaccine 2/15  dexa 3/16 - stable /osteopenia  She takes her ca and D  No broken bones Had one trip and fall - tripped on a stepping stone - (was in a hurry)- is more careful now  Does not fall recurrently  Advance directive -has a living will and poa Cognitive function addressed- see scanned forms- and if abnormal then additional documentation follows. Has disc memory issues (see note for march)- no better and no worse  She reads/does puzzles/ square dance/ and socializes MMS was 29/30  Does not want to go to neuro  PMH and SH  reviewed  Meds, vitals, and allergies reviewed.   ROS: See HPI.  Otherwise negative.    Wt is down 2 lb with bmi of 29 Trying to make good choices/eating healthy  Would like to loose more  Goes to the gym 2-3 days per week  She has cut back on pepsi    Hypothyroidism Lab Results  Component Value Date   TSH 8.36* 04/25/2015   no missed doses  This is up  Feels sluggish and lazy  No skin or hair changes  Take 50 mcg -will inc to 75   Cholesterol Lab Results  Component Value Date   CHOL 182 04/25/2015   CHOL 181 04/20/2014   CHOL 184 09/03/2013   Lab Results  Component Value Date   HDL 44.30 04/25/2015   HDL 36.50* 04/20/2014   HDL 42 09/03/2013   Lab Results  Component Value Date   LDLCALC 109* 04/25/2015   LDLCALC 116* 09/03/2013   LDLCALC 119* 02/12/2013   Lab Results  Component Value Date   TRIG 146.0 04/25/2015   TRIG 250.0* 04/20/2014   TRIG 128 09/03/2013   Lab Results  Component Value Date   CHOLHDL 4 04/25/2015   CHOLHDL 5 04/20/2014   CHOLHDL 4.4 09/03/2013   Lab Results  Component Value Date   LDLDIRECT 92.0 04/20/2014   LDLDIRECT 151.8 03/15/2011   LDLDIRECT 138.5 05/05/2010   Taking lipitor  Watching diet  Making a difference - good overall   Results for orders placed  or performed in visit on 04/25/15  CBC with Differential/Platelet  Result Value Ref Range   WBC 8.2 4.0 - 10.5 K/uL   RBC 4.41 3.87 - 5.11 Mil/uL   Hemoglobin 13.8 12.0 - 15.0 g/dL   HCT 56.4 33.2 - 95.1 %   MCV 93.6 78.0 - 100.0 fl   MCHC 33.5 30.0 - 36.0 g/dL   RDW 88.4 16.6 - 06.3 %   Platelets 257.0 150.0 - 400.0 K/uL   Neutrophils Relative % 62.4 43.0 - 77.0 %   Lymphocytes Relative 26.6 12.0 - 46.0 %   Monocytes Relative 9.4 3.0 - 12.0 %   Eosinophils Relative 1.1 0.0 - 5.0 %   Basophils Relative 0.5 0.0 - 3.0 %   Neutro Abs 5.1 1.4 - 7.7 K/uL   Lymphs Abs 2.2 0.7 - 4.0 K/uL   Monocytes Absolute 0.8 0.1 - 1.0 K/uL   Eosinophils Absolute 0.1 0.0 - 0.7 K/uL    Basophils Absolute 0.0 0.0 - 0.1 K/uL  Comprehensive metabolic panel  Result Value Ref Range   Sodium 141 135 - 145 mEq/L   Potassium 4.4 3.5 - 5.1 mEq/L   Chloride 104 96 - 112 mEq/L   CO2 31 19 - 32 mEq/L   Glucose, Bld 92 70 - 99 mg/dL   BUN 23 6 - 23 mg/dL   Creatinine, Ser 0.16 0.40 - 1.20 mg/dL   Total Bilirubin 0.5 0.2 - 1.2 mg/dL   Alkaline Phosphatase 96 39 - 117 U/L   AST 20 0 - 37 U/L   ALT 16 0 - 35 U/L   Total Protein 6.9 6.0 - 8.3 g/dL   Albumin 4.0 3.5 - 5.2 g/dL   Calcium 01.0 8.4 - 93.2 mg/dL   GFR 35.57 (L) >32.20 mL/min  Lipid panel  Result Value Ref Range   Cholesterol 182 0 - 200 mg/dL   Triglycerides 254.2 0.0 - 149.0 mg/dL   HDL 70.62 >37.62 mg/dL   VLDL 83.1 0.0 - 51.7 mg/dL   LDL Cholesterol 616 (H) 0 - 99 mg/dL   Total CHOL/HDL Ratio 4    NonHDL 137.90   TSH  Result Value Ref Range   TSH 8.36 (H) 0.35 - 4.50 uIU/mL    Patient Active Problem List   Diagnosis Date Noted  . Routine general medical examination at a health care facility 04/24/2015  . Overweight 03/15/2015  . Dysuria 02/01/2015  . Cholelithiasis 01/26/2015  . Abdominal pain, epigastric 01/24/2015  . Memory loss 09/22/2014  . Exercise intolerance 07/30/2014  . Estrogen deficiency 04/28/2014  . Tachycardia 04/03/2014  . Family history of heart disease 08/27/2013  . Dyspnea on exertion 08/18/2013  . Positional vertigo 08/18/2013  . Abdominal pain, left lower quadrant 09/09/2012  . Abnormal x-ray of neck 01/11/2012  . Encounter for Medicare annual wellness exam 11/23/2011  . Other screening mammogram 11/23/2011  . Insomnia 01/16/2011  . Osteopenia 11/26/2008  . NEOPLASM, MALIGNANT, BREAST, HX OF 07/01/2008  . Recurrent UTI 01/15/2008  . WEIGHT GAIN 01/15/2008  . Hypothyroidism 12/12/2007  . Hyperlipidemia 12/12/2007  . Adjustment disorder with mixed anxiety and depressed mood 12/12/2007  . GERD 12/12/2007  . ADHESIVE CAPSULITIS 12/12/2007   Past Medical History   Diagnosis Date  . Cancer (HCC) 2000    breast had lumpectomy/radiation x36,mammo ,no chemo  . Depression   . GERD (gastroesophageal reflux disease)   . Hyperlipidemia   . Hypothyroid   . Frozen shoulder   . Osteopenia     BMD 2004,  WNL 2008  . Shingles     chronic body pain, left side of body  . Dysuria-frequency syndrome     takes AZO  . Zoster     Hx of  . Hypothyroid   . Gall stones   . Recurrent UTI   . Wears glasses   . Cataract of both eyes   . Pneumonia   . Shortness of breath dyspnea     when climbing stairs only   Past Surgical History  Procedure Laterality Date  . Breast surgery  2000    lumpectomy  . Colonoscopy    . Multiple tooth extractions    . Cholecystectomy N/A 02/21/2015    Procedure: LAPAROSCOPIC CHOLECYSTECTOMY WITH INTRAOPERATIVE CHOLANGIOGRAM;  Surgeon: Gaynelle Adu, MD;  Location: [facility] OR;  Service: General;  Laterality: N/A;   Social History  Substance Use Topics  . Smoking status: Never Smoker   . Smokeless tobacco: Never Used  . Alcohol Use: No   Family History  Problem Relation Age of Onset  . Osteoporosis Mother   . Parkinsonism Father   . Cancer Sister     breast CA  . Cancer Other     breast CA  . Heart disease Maternal Grandmother   . Heart disease Maternal Grandfather   . Coronary artery disease Sister    Allergies  Allergen Reactions  . Ciprofloxacin Other (See Comments)    Unk  . Citalopram Hydrobromide     Intrusive/ weird thoughts   . Flagyl [Metronidazole]   . Oxycodone Other (See Comments)    Headaches   Current Outpatient Prescriptions on File Prior to Visit  Medication Sig Dispense Refill  . atorvastatin (LIPITOR) 20 MG tablet Take 1 tablet (20 mg total) by mouth daily. 90 tablet 3  . b complex vitamins tablet Take 1 tablet by mouth daily.    . calcium carbonate (OS-CAL - DOSED IN MG OF ELEMENTAL CALCIUM) 1250 (500 CA) MG tablet Take 1 tablet by mouth daily.    . Cholecalciferol (VITAMIN D3) 2000 UNITS capsule  Take 2,000 Units by mouth daily.      [name] escitalopram (LEXAPRO) 20 MG tablet Take 1 tablet (20 mg total) by mouth daily. 30 tablet 11  . Multiple Vitamin (MULTIVITAMIN) tablet Take 1 tablet by mouth daily.      . Omega-3 Fatty Acids (FISH OIL PO) Take 1 capsule by mouth daily.    . pantoprazole (PROTONIX) 40 MG tablet Take 1 tablet (40 mg total) by mouth 2 (two) times daily before a meal. 60 tablet 1  . vitamin C (ASCORBIC ACID) 500 MG tablet Take 500 mg by mouth daily.    [name] levothyroxine (SYNTHROID, LEVOTHROID) 75 MCG tablet Take 1 tablet (75 mcg total) by mouth daily. 30 tablet 11  . [DISCONTINUED] Diphenhydramine-APAP, sleep, (TYLENOL PM EXTRA STRENGTH PO) Take by mouth as needed.       No current facility-administered medications on file prior to visit.     Review of Systems Review of Systems  Constitutional: Negative for fever, appetite change,  and unexpected weight change. pos for fatigue Eyes: Negative for pain and visual disturbance. ENt - rhinorrhea/allergies   Respiratory: Negative for cough and shortness of breath.   Cardiovascular: Negative for cp or palpitations    Gastrointestinal: Negative for nausea, diarrhea and constipation.  Genitourinary: Negative for urgency and frequency.  Skin: Negative for pallor or rash   Neurological: Negative for weakness, light-headedness, numbness and headaches.  Hematological: Negative for adenopathy. Does not bruise/bleed easily.  Psychiatric/Behavioral: Negative for dysphoric mood. The patient is not nervous/anxious.         Objective:   Physical Exam  Constitutional: She appears well-developed and well-nourished. No distress.  overwt and well appearing   HENT:  Head: Normocephalic and atraumatic.  Right Ear: External ear normal.  Left Ear: External ear normal.  Mouth/Throat: Oropharynx is clear and moist.  Eyes: Conjunctivae and EOM are normal. Pupils are equal, round, and reactive to light. No scleral icterus.  Neck: Normal  range of motion. Neck supple. No JVD present. Carotid bruit is not present. No thyromegaly present.  Cardiovascular: Normal rate, regular rhythm, normal heart sounds and intact distal pulses.  Exam reveals no gallop.   Pulmonary/Chest: Effort normal and breath sounds normal. No respiratory distress. She has no wheezes. She exhibits no tenderness.  Abdominal: Soft. Bowel sounds are normal. She exhibits no distension, no abdominal bruit and no mass. There is no tenderness.  Genitourinary: No breast swelling, tenderness, discharge or bleeding.  Breast exam: No mass, nodules, thickening, tenderness, bulging, retraction, inflamation, nipple discharge or skin changes noted.  No axillary or clavicular LA.      Musculoskeletal: Normal range of motion. She exhibits no edema or tenderness.  Lymphadenopathy:    She has no cervical adenopathy.  Neurological: She is alert. She has normal reflexes. No cranial nerve deficit. She exhibits normal muscle tone. Coordination normal.  Skin: Skin is warm and dry. No rash noted. No erythema. No pallor.  Psychiatric: She has a normal mood and affect.          Assessment & Plan:   Problem List Items Addressed This Visit      Endocrine   Hypothyroidism - Primary    Lab Results  Component Value Date   TSH 8.36* 04/25/2015    Will inc dose to daily - she takes it appropriately and does not miss doses No changes on exam.      Relevant Medications   levothyroxine (SYNTHROID, LEVOTHROID) 75 MCG tablet     Musculoskeletal and Integument   Osteopenia    One fall/no fx Stable dexa in 2016 Disc fall prec   Disc need for calcium/ vitamin D/ wt bearing exercise and bone density test every 2 y to monitor Disc safety/ fracture risk in detail          Other   Encounter for Medicare annual wellness exam    Reviewed health habits including diet and exercise and skin cancer prevention Reviewed appropriate screening tests for age  Also reviewed health mt  list, fam hx and immunization status , as well as social and family history   See HPI Labs reviewed dexa reviewed Schedule non fasting lab in 6 weeks for thyroid  Change thyroid dose from 50 to 75 mcg daily  If any side effects or or problems  Always take your thyroid medicine away from calcium  Take care of yourself Stay active and keep moving  Stay social and keep your brain active also  Continue your calcium and vitamin D       Hyperlipidemia    Improving with time on lipitor and diet   Disc goals for lipids and reasons to control them Rev labs with pt Rev low sat fat diet in detail Enc to keep up great habits       Routine general medical examination at a health care facility    Reviewed health habits including diet and exercise and skin cancer prevention Reviewed appropriate screening tests  for age  Also reviewed health mt list, fam hx and immunization status , as well as social and family history   See HPI Labs reviewed dexa reviewed Schedule non fasting lab in 6 weeks for thyroid  Change thyroid dose from 50 to 75 mcg daily  If any side effects or or problems  Always take your thyroid medicine away from calcium  Take care of yourself Stay active and keep moving  Stay social and keep your brain active also  Continue your calcium and vitamin D

## 2015-05-02 NOTE — Assessment & Plan Note (Addendum)
Lab Results  Component Value Date   TSH 8.36* 04/25/2015    Will inc dose to 61mcg daily - she takes it appropriately and does not miss doses No changes on exam.

## 2015-05-02 NOTE — Assessment & Plan Note (Signed)
Reviewed health habits including diet and exercise and skin cancer prevention Reviewed appropriate screening tests for age  Also reviewed health mt list, fam hx and immunization status , as well as social and family history   See HPI Labs reviewed dexa reviewed Schedule non fasting lab in 6 weeks for thyroid  Change thyroid dose from 50 to 75 mcg daily  If any side effects or or problems  Always take your thyroid medicine away from calcium  Take care of yourself Stay active and keep moving  Stay social and keep your brain active also  Continue your calcium and vitamin D

## 2015-05-02 NOTE — Progress Notes (Signed)
Pre visit review using our clinic review tool, if applicable. No additional management support is needed unless otherwise documented below in the visit note. 

## 2015-05-02 NOTE — Patient Instructions (Addendum)
Schedule non fasting lab in 6 weeks for thyroid  Change thyroid dose from 50 to 75 mcg daily  If any side effects or or problems  Always take your thyroid medicine away from calcium  Take care of yourself Stay active and keep moving  Stay social and keep your brain active also  Continue your calcium and vitamin D

## 2015-05-02 NOTE — Assessment & Plan Note (Addendum)
Improving with time on lipitor and diet   Disc goals for lipids and reasons to control them Rev labs with pt Rev low sat fat diet in detail Enc to keep up great habits

## 2015-05-02 NOTE — Assessment & Plan Note (Addendum)
One fall/no fx Stable dexa in 2016 Disc fall prec   Disc need for calcium/ vitamin D/ wt bearing exercise and bone density test every 2 y to monitor Disc safety/ fracture risk in detail

## 2015-05-06 ENCOUNTER — Telehealth: Payer: Self-pay | Admitting: Family Medicine

## 2015-05-06 NOTE — Telephone Encounter (Signed)
PLEASE NOTE: All timestamps contained within this report are represented as [place] Standard Time. CONFIDENTIALTY NOTICE: This fax transmission is intended only for the addressee. It contains information that is legally privileged, confidential or otherwise protected from use or disclosure. If you are not the intended recipient, you are strictly prohibited from reviewing, disclosing, copying using or disseminating any of this information or taking any action in reliance on or regarding this information. If you have received this fax in error, please notify us immediately by telephone so that we can arrange for its return to [place]. Phone: [phone], Toll-Free: [phone], Fax: [phone] Page: 1 of 2 Call Id: [id number] [facility] Day - Client TELEPHONE ADVICE RECORD [facility] Call [facility] Patient Name: Frances Hoffman Gender: Female DOB: 1940/04/20 Age: 75 Y 7 M 10 D Return Phone Number: [phone] (Primary) Address: [address] City/State/Zip: [place] [address] Client [facility] Day - Client Client Site [facility] - Day Physician [place], Idamae Schuller Contact Type Call Who Is Calling Patient / Member / Family / Caregiver Call Type Triage / Clinical Relationship To Patient Self Return Phone Number [phone] (Primary) Chief Complaint Vaginal Discharge Reason for Call Symptomatic / Request for Health Information Initial Comment caller states she has vaginal discharge and itching Appointment Disposition EMR Appointment Not Necessary Info pasted into Epic Yes PreDisposition Call Doctor Translation No Nurse Assessment Nurse: Deatra James, RN, Corrie Dandy Date/Time Lamount Cohen Time): 05/06/2015 2:21:33 PM Confirm and document reason for call. If symptomatic, describe symptoms. You must click the next button to save text entered. ---Patient states she is having vaginal discharge and itching. States she wants to get something  to use for this and if it is not better by Monday she will call back for appt. Has the patient traveled out of the country within the last 30 days? ---No Does the patient have any new or worsening symptoms? ---Yes Will a triage be completed? ---Yes Related visit to physician within the last 2 weeks? ---No Does the PT have any chronic conditions? (i.e. diabetes, asthma, etc.) ---No Is this a behavioral health or substance abuse call? ---No Nurse: Deatra James, RN, Corrie Dandy Date/Time (Eastern Time): 05/06/2015 2:30:19 PM Please select the assessment type ---Standing order Other current medications? ---Unknown Medication allergies? ---No Pharmacy name and phone number. ---[phone] PLEASE NOTE: All timestamps contained within this report are represented as [place] Standard Time. CONFIDENTIALTY NOTICE: This fax transmission is intended only for the addressee. It contains information that is legally privileged, confidential or otherwise protected from use or disclosure. If you are not the intended recipient, you are strictly prohibited from reviewing, disclosing, copying using or disseminating any of this information or taking any action in reliance on or regarding this information. If you have received this fax in error, please notify us immediately by telephone so that we can arrange for its return to [place]. Phone: [phone], Toll-Free: [phone], Fax: [phone] Page: 2 of 2 Call Id: [id number] Guidelines Guideline Title Affirmed Question Affirmed Notes Nurse Date/Time Lamount Cohen Time) Vaginal Discharge Symptoms of a vaginal yeast infection (i.e., white, thick, cottagecheese- like, itchy, not bad smelling discharge) Carvel Getting 05/06/2015 2:22:43 PM Disp. Time Lamount Cohen Time) Disposition Final User 05/06/2015 2:05:55 PM Send To Clinical Follow Up Queue Alfonse Alpers 05/06/2015 2:57:10 PM Home Care Yes Noe, RN, Jenelle Mages Understands: Yes Disagree/Comply: Comply Care Advice Given Per  Guideline HOME CARE: You should be able to treat this at home. REASSURANCE: Your symptoms are probably caused by  a yeast infection of the vagina. Over the counter (OTC) medications usually can clear up these symptoms. OTC ANTIFUNGAL MEDS FOR VAGINAL YEAST INFECTION: * There are a number of OTC medications for treatment of vaginal yeast infections. * Read and follow the package instructions closely. EXPECTED COURSE: * If no improvement within 3 days, you will need to be examined by a physician. If so, make an appointment with your PCP. * Do not use the yeast medication during the 24 hours prior to your appointment (Reason: interferes with examination). GENITAL HYGIENE: * Keep your genital area clean. Wash daily. * Keep your genital area dry. Wear cotton underwear or underwear with a cotton crotch. * Do not douche * Do not use feminine hygiene products * Avoid prolonged wearing of pantyhose, tight jeans * Avoid bubble baths, deodorant tampons CALL BACK IF: * Discharge becomes yellow or green * Discharge becomes foul smelling or itchy * Fever or abdominal pain occur * You become worse. CARE ADVICE given per Vaginal Discharge (Adult) guideline.

## 2015-05-06 NOTE — Telephone Encounter (Signed)
Patient Name: Frances Hoffman  DOB: 1940/10/26    Initial Comment caller states she has vaginal discharge and itching   Nurse Assessment  Nurse: Verlin Fester, RN, Stanton Kidney Date/Time Eilene Ghazi Time): 05/06/2015 2:21:33 PM  Confirm and document reason for call. If symptomatic, describe symptoms. You must click the next button to save text entered. ---Patient states she is having vaginal discharge and itching. States she wants to get something to use for this and if it is not better by Monday she will call back for appt.  Has the patient traveled out of the country within the last 30 days? ---No  Does the patient have any new or worsening symptoms? ---Yes  Will a triage be completed? ---Yes  Related visit to physician within the last 2 weeks? ---No  Does the PT have any chronic conditions? (i.e. diabetes, asthma, etc.) ---No  Is this a behavioral health or substance abuse call? ---No    Nurse: Verlin Fester, RN, Stanton Kidney Date/Time (Eastern Time): 05/06/2015 2:30:19 PM  Please select the assessment type ---Standing order   Other current medications? ---Unknown   Medication allergies? ---No   Pharmacy name and phone number. ---[phone]      Guidelines    Guideline Title Affirmed Question Affirmed Notes  Vaginal Discharge Symptoms of a vaginal yeast infection (i.e., white, thick, cottage-cheese-like, itchy, not bad smelling discharge)    Final Disposition User   [place]der[place], RN, Stanton Kidney    Disagree/Comply: Comply

## 2015-05-09 ENCOUNTER — Other Ambulatory Visit: Payer: Self-pay | Admitting: Family Medicine

## 2015-05-11 ENCOUNTER — Encounter: Payer: Self-pay | Admitting: Family Medicine

## 2015-05-11 ENCOUNTER — Ambulatory Visit (INDEPENDENT_AMBULATORY_CARE_PROVIDER_SITE_OTHER): Payer: Medicare HMO | Admitting: Family Medicine

## 2015-05-11 VITALS — BP 118/68 | HR 79 | Temp 97.8°F | Ht 63.0 in | Wt 167.5 lb

## 2015-05-11 DIAGNOSIS — B373 Candidiasis of vulva and vagina: Secondary | ICD-10-CM | POA: Diagnosis not present

## 2015-05-11 DIAGNOSIS — B3731 Acute candidiasis of vulva and vagina: Secondary | ICD-10-CM

## 2015-05-11 LAB — POCT WET PREP (WET MOUNT): KOH WET PREP POC: NEGATIVE

## 2015-05-11 MED ORDER — FLUCONAZOLE 150 MG PO TABS: ORAL_TABLET | ORAL | Status: DC

## 2015-05-11 MED ORDER — TERCONAZOLE 0.8 % VA CREA: TOPICAL_CREAM | VAGINAL | Status: DC

## 2015-05-11 NOTE — Progress Notes (Signed)
Subjective:    Patient ID: Frances Hoffman, female    DOB: 1940-07-11, 75 y.o.   MRN: [id number]  HPI Here with vaginal itching   For a week  Mostly external  No rash seen  Some vaginal discharge - looks a little green at time  No odor   Has been scratching   Tried monostat over the counter- it burned (int and externally)  May have helped a bit  She used it on Friday   No abx or steroid lately   Is not sexually active   No cramping or pelvic pain   Patient Active Problem List   Diagnosis Date Noted  . Routine general medical examination at a health care facility 04/24/2015  . Overweight 03/15/2015  . Dysuria 02/01/2015  . Cholelithiasis 01/26/2015  . Abdominal pain, epigastric 01/24/2015  . Memory loss 09/22/2014  . Exercise intolerance 07/30/2014  . Estrogen deficiency 04/28/2014  . Tachycardia 04/03/2014  . Family history of heart disease 08/27/2013  . Dyspnea on exertion 08/18/2013  . Positional vertigo 08/18/2013  . Abdominal pain, left lower quadrant 09/09/2012  . Abnormal x-ray of neck 01/11/2012  . Encounter for Medicare annual wellness exam 11/23/2011  . Other screening mammogram 11/23/2011  . Insomnia 01/16/2011  . Osteopenia 11/26/2008  . NEOPLASM, MALIGNANT, BREAST, HX OF 07/01/2008  . Recurrent UTI 01/15/2008  . WEIGHT GAIN 01/15/2008  . Hypothyroidism 12/12/2007  . Hyperlipidemia 12/12/2007  . Adjustment disorder with mixed anxiety and depressed mood 12/12/2007  . GERD 12/12/2007  . ADHESIVE CAPSULITIS 12/12/2007   Past Medical History  Diagnosis Date  . Cancer (HCC) 2000    breast had lumpectomy/radiation x36,mammo ,no chemo  . Depression   . GERD (gastroesophageal reflux disease)   . Hyperlipidemia   . Hypothyroid   . Frozen shoulder   . Osteopenia     BMD 2004, WNL 2008  . Shingles     chronic body pain, left side of body  . Dysuria-frequency syndrome     takes AZO  . Zoster     Hx of  . Hypothyroid   . Gall stones   .  Recurrent UTI   . Wears glasses   . Cataract of both eyes   . Pneumonia   . Shortness of breath dyspnea     when climbing stairs only   Past Surgical History  Procedure Laterality Date  . Breast surgery  2000    lumpectomy  . Colonoscopy    . Multiple tooth extractions    . Cholecystectomy N/A 02/21/2015    Procedure: LAPAROSCOPIC CHOLECYSTECTOMY WITH INTRAOPERATIVE CHOLANGIOGRAM;  Surgeon: Gaynelle Adu, MD;  Location: [facility] OR;  Service: General;  Laterality: N/A;   Social History  Substance Use Topics  . Smoking status: Never Smoker   . Smokeless tobacco: Never Used  . Alcohol Use: No   Family History  Problem Relation Age of Onset  . Osteoporosis Mother   . Parkinsonism Father   . Cancer Sister     breast CA  . Cancer Other     breast CA  . Heart disease Maternal Grandmother   . Heart disease Maternal Grandfather   . Coronary artery disease Sister    Allergies  Allergen Reactions  . Ciprofloxacin Other (See Comments)    Unk  . Citalopram Hydrobromide     Intrusive/ weird thoughts   . Flagyl [Metronidazole]   . Oxycodone Other (See Comments)    Headaches   Current Outpatient Prescriptions on File Prior to  Visit  Medication Sig Dispense Refill  . atorvastatin (LIPITOR) 20 MG tablet Take 1 tablet (20 mg total) by mouth daily. 90 tablet 3  . b complex vitamins tablet Take 1 tablet by mouth daily.    . calcium carbonate (OS-CAL - DOSED IN MG OF ELEMENTAL CALCIUM) 1250 (500 CA) MG tablet Take 1 tablet by mouth daily.    . Cholecalciferol (VITAMIN D3) 2000 UNITS capsule Take 2,000 Units by mouth daily.      [name] escitalopram (LEXAPRO) 20 MG tablet TAKE 1 TABLET BY MOUTH DAILY 30 tablet 11  . levothyroxine (SYNTHROID, LEVOTHROID) 75 MCG tablet Take 1 tablet (75 mcg total) by mouth daily. 30 tablet 11  . Multiple Vitamin (MULTIVITAMIN) tablet Take 1 tablet by mouth daily.      . Omega-3 Fatty Acids (FISH OIL PO) Take 1 capsule by mouth daily.    . pantoprazole (PROTONIX) 40  MG tablet Take 1 tablet (40 mg total) by mouth 2 (two) times daily before a meal. 60 tablet 1  . vitamin C (ASCORBIC ACID) 500 MG tablet Take 500 mg by mouth daily.    . [DISCONTINUED] Diphenhydramine-APAP, sleep, (TYLENOL PM EXTRA STRENGTH PO) Take by mouth as needed.       No current facility-administered medications on file prior to visit.      Review of Systems Review of Systems  Constitutional: Negative for fever, appetite change, fatigue and unexpected weight change.  Eyes: Negative for pain and visual disturbance.  Respiratory: Negative for cough and shortness of breath.   Cardiovascular: Negative for cp or palpitations    Gastrointestinal: Negative for nausea, diarrhea and constipation.  Genitourinary: Negative for urgency and frequency. pos for vaginal itch w/o lesions, neg for pelvic pain  Skin: Negative for pallor or rash   Neurological: Negative for weakness, light-headedness, numbness and headaches.  Hematological: Negative for adenopathy. Does not bruise/bleed easily.  Psychiatric/Behavioral: Negative for dysphoric mood. The patient is not nervous/anxious.         Objective:   Physical Exam  Constitutional: She appears well-developed and well-nourished. No distress.  obese and well appearing   HENT:  Head: Normocephalic and atraumatic.  Mouth/Throat: Oropharynx is clear and moist.  Eyes: Conjunctivae and EOM are normal. Pupils are equal, round, and reactive to light.  Cardiovascular: Normal rate and regular rhythm.   Abdominal: Soft. Bowel sounds are normal. She exhibits no distension and no mass. There is no tenderness.  No suprapubic tenderness or fullness    Genitourinary:  Mild hyperemia of labia majora slt cloudy vag d/c -no odor No lesions  Wet prep obt  Lymphadenopathy:    She has no cervical adenopathy.  Neurological: She is alert.  Skin: Skin is warm. No rash noted. No pallor.  Psychiatric: She has a normal mood and affect.          Assessment  & Plan:   Problem List Items Addressed This Visit      Genitourinary   Yeast vaginitis - Primary    Self treated otc monistat-which burned and helped somewhat Yeast buds on wet prep tx with diflucan times 2 terazol ext prn  Keep area clean and dry Disc ways to prevent vaginitis  Update if not starting to improve in a week or if worsening        Relevant Medications   fluconazole (DIFLUCAN) 150 MG tablet   Other Relevant Orders   POCT Wet Prep [employer] [place]) (Completed)

## 2015-05-11 NOTE — Progress Notes (Signed)
Pre visit review using our clinic review tool, if applicable. No additional management support is needed unless otherwise documented below in the visit note. 

## 2015-05-11 NOTE — Patient Instructions (Signed)
Take diflucan for a yeast infection one pill orally today and one pill in 3 days  Hold your cholesterol medicine (atorvastatin) on the days you take it  Try the terazol cream externally to the areas that itch daily    Update if not starting to improve in a week or if worsening

## 2015-05-11 NOTE — Assessment & Plan Note (Signed)
Self treated otc monistat-which burned and helped somewhat Yeast buds on wet prep tx with diflucan times 2 terazol ext prn  Keep area clean and dry Disc ways to prevent vaginitis  Update if not starting to improve in a week or if worsening

## 2015-05-17 ENCOUNTER — Telehealth: Payer: Self-pay

## 2015-05-17 NOTE — Telephone Encounter (Signed)
Patient advised.

## 2015-05-17 NOTE — Telephone Encounter (Signed)
Pt was seen 05/11/15; pt picked up the diflucan but not the terazol cream due to cost of cream; itching is better but the vaginal discharge is the same. Pt request what to do. [facility] [address]. Pt request cb.

## 2015-05-17 NOTE — Telephone Encounter (Signed)
It can take a while for the d/c to subside after a yeast infection (most importantly the itching is better)  Give it another week and let me know if no further improvement in the discharge  Also alert me if itching or discomfort returns at any time

## 2015-05-27 ENCOUNTER — Encounter: Payer: Self-pay | Admitting: Family Medicine

## 2015-05-27 ENCOUNTER — Ambulatory Visit (INDEPENDENT_AMBULATORY_CARE_PROVIDER_SITE_OTHER): Payer: Medicare HMO | Admitting: Family Medicine

## 2015-05-27 VITALS — BP 130/82 | HR 93 | Temp 98.5°F | Ht 63.0 in | Wt 169.5 lb

## 2015-05-27 DIAGNOSIS — J029 Acute pharyngitis, unspecified: Secondary | ICD-10-CM | POA: Diagnosis not present

## 2015-05-27 LAB — POCT RAPID STREP A (OFFICE): RAPID STREP A SCREEN: NEGATIVE

## 2015-05-27 MED ORDER — AMOXICILLIN 500 MG PO CAPS: 500.0000 mg | ORAL_CAPSULE | Freq: Two times a day (BID) | ORAL | Status: DC

## 2015-05-27 NOTE — Progress Notes (Signed)
Subjective:  Patient ID: Frances Hoffman, female    DOB: 1941-02-22  Age: 75 y.o. MRN: [id number]  CC: Sore throat  HPI:  75 year old female presents to clinic today with the above complaint.  Sore throat  Started 2 days ago.  Pain is severe per report.   No other associated symptoms.  No exacerbating or relieving factors.  No associated fever.  No other complaints at this time.  Social Hx   Social History   Social History  . Marital Status: Married    Spouse Name: N/A  . Number of Children: N/A  . Years of Education: N/A   Social History Main Topics  . Smoking status: Never Smoker   . Smokeless tobacco: Never Used  . Alcohol Use: No  . Drug Use: No  . Sexual Activity: Not Asked   Other Topics Concern  . None   Social History Narrative   Review of Systems  Constitutional: Negative for fever.  HENT: Positive for sore throat.    Objective:  BP 130/82 mmHg  Pulse 93  Temp(Src) 98.5 F (36.9 C) (Oral)  Ht 5\' 3"  (1.6 m)  Wt 169 lb 8 oz (76.885 kg)  BMI 30.03 kg/m2  SpO2 90%  BP/Weight 05/27/2015 05/11/2015 05/02/2015  Systolic BP 130 118 108  Diastolic BP 82 68 68  Wt. (Lbs) 169.5 167.5 167.25  BMI 30.03 29.68 29.63   Physical Exam  Constitutional: She appears well-developed. No distress.  HENT:  Head: Normocephalic and atraumatic.  Oropharynx with mild-moderate erythema.  Eyes: Conjunctivae are normal.  Cardiovascular: Normal rate and regular rhythm.   Pulmonary/Chest: Effort normal and breath sounds normal.  Psychiatric:  Flat affect.  Vitals reviewed.   Lab Results  Component Value Date   WBC 8.2 04/25/2015   HGB 13.8 04/25/2015   HCT 41.3 04/25/2015   PLT 257.0 04/25/2015   GLUCOSE 92 04/25/2015   CHOL 182 04/25/2015   TRIG 146.0 04/25/2015   HDL 44.30 04/25/2015   LDLDIRECT 92.0 04/20/2014   LDLCALC 109* 04/25/2015   ALT 16 04/25/2015   AST 20 04/25/2015   NA 141 04/25/2015   K 4.4 04/25/2015   CL 104 04/25/2015   CREATININE  1.14 04/25/2015   BUN 23 04/25/2015   CO2 31 04/25/2015   TSH 8.36* 04/25/2015   HGBA1C 5.6 04/03/2014    Assessment & Plan:   Problem List Items Addressed This Visit    Acute pharyngitis - Primary    New problem. Rapid strep negative today. Discussed that this is likely viral and recommended supportive care while awaiting culture results. Patient states that given her pain she would like empiric treatment while awaiting culture. Rx for Amoxicillin sent.      Relevant Orders   POCT rapid strep A (Completed)      Meds ordered this encounter  Medications  . amoxicillin (AMOXIL) 500 MG capsule    Sig: Take 1 capsule (500 mg total) by mouth 2 (two) times daily.    Dispense:  20 capsule    Refill:  0    Follow-up: PRN  Everlene Other DO [facility]

## 2015-05-27 NOTE — Assessment & Plan Note (Signed)
New problem. Rapid strep negative today. Discussed that this is likely viral and recommended supportive care while awaiting culture results. Patient states that given her pain she would like empiric treatment while awaiting culture. Rx for Amoxicillin sent.

## 2015-05-27 NOTE — Progress Notes (Signed)
Pre visit review using our clinic review tool, if applicable. No additional management support is needed unless otherwise documented below in the visit note. 

## 2015-05-27 NOTE — Patient Instructions (Signed)
Take the amoxicillin as prescribed.  Follow up as needed.  Take care  Dr. Lacinda Axon   Pharyngitis Pharyngitis is redness, pain, and swelling (inflammation) of your pharynx.  CAUSES  Pharyngitis is usually caused by infection. Most of the time, these infections are from viruses (viral) and are part of a cold. However, sometimes pharyngitis is caused by bacteria (bacterial). Pharyngitis can also be caused by allergies. Viral pharyngitis may be spread from person to person by coughing, sneezing, and personal items or utensils (cups, forks, spoons, toothbrushes). Bacterial pharyngitis may be spread from person to person by more intimate contact, such as kissing.  SIGNS AND SYMPTOMS  Symptoms of pharyngitis include:   Sore throat.   Tiredness (fatigue).   Low-grade fever.   Headache.  Joint pain and muscle aches.  Skin rashes.  Swollen lymph nodes.  Plaque-like film on throat or tonsils (often seen with bacterial pharyngitis). DIAGNOSIS  Your health care provider will ask you questions about your illness and your symptoms. Your medical history, along with a physical exam, is often all that is needed to diagnose pharyngitis. Sometimes, a rapid strep test is done. Other lab tests may also be done, depending on the suspected cause.  TREATMENT  Viral pharyngitis will usually get better in 3-4 days without the use of medicine. Bacterial pharyngitis is treated with medicines that kill germs (antibiotics).  HOME CARE INSTRUCTIONS   Drink enough water and fluids to keep your urine clear or pale yellow.   Only take over-the-counter or prescription medicines as directed by your health care provider:   If you are prescribed antibiotics, make sure you finish them even if you start to feel better.   Do not take aspirin.   Get lots of rest.   Gargle with 8 oz of salt water ( tsp of salt per 1 qt of water) as often as every 1-2 hours to soothe your throat.   Throat lozenges (if you  are not at risk for choking) or sprays may be used to soothe your throat. SEEK MEDICAL CARE IF:   You have large, tender lumps in your neck.  You have a rash.  You cough up green, yellow-brown, or bloody spit. SEEK IMMEDIATE MEDICAL CARE IF:   Your neck becomes stiff.  You drool or are unable to swallow liquids.  You vomit or are unable to keep medicines or liquids down.  You have severe pain that does not go away with the use of recommended medicines.  You have trouble breathing (not caused by a stuffy nose). MAKE SURE YOU:   Understand these instructions.  Will watch your condition.  Will get help right away if you are not doing well or get worse.   This information is not intended to replace advice given to you by your health care provider. Make sure you discuss any questions you have with your health care provider.   Document Released: 02/19/2005 Document Revised: 12/10/2012 Document Reviewed: 10/27/2012 Elsevier Interactive Patient Education [employer].

## 2015-05-28 NOTE — Addendum Note (Signed)
Addended by: Coral Spikes on: 05/28/2015 12:00 AM   Modules accepted: Level of Service

## 2015-06-06 ENCOUNTER — Ambulatory Visit (INDEPENDENT_AMBULATORY_CARE_PROVIDER_SITE_OTHER): Payer: Medicare HMO | Admitting: Family Medicine

## 2015-06-06 ENCOUNTER — Encounter: Payer: Self-pay | Admitting: Family Medicine

## 2015-06-06 VITALS — BP 112/76 | HR 84 | Temp 97.5°F | Ht 63.0 in | Wt 167.0 lb

## 2015-06-06 DIAGNOSIS — R300 Dysuria: Secondary | ICD-10-CM | POA: Diagnosis not present

## 2015-06-06 DIAGNOSIS — E785 Hyperlipidemia, unspecified: Secondary | ICD-10-CM

## 2015-06-06 DIAGNOSIS — N390 Urinary tract infection, site not specified: Secondary | ICD-10-CM | POA: Insufficient documentation

## 2015-06-06 DIAGNOSIS — N3000 Acute cystitis without hematuria: Secondary | ICD-10-CM

## 2015-06-06 DIAGNOSIS — M79606 Pain in leg, unspecified: Secondary | ICD-10-CM | POA: Insufficient documentation

## 2015-06-06 LAB — POC URINALSYSI DIPSTICK (AUTOMATED)
Glucose, UA: NEGATIVE
Ketones, UA: NEGATIVE
Nitrite, UA: POSITIVE
PROTEIN UA: NEGATIVE
SPEC GRAV UA: 1.01
UROBILINOGEN UA: 4
pH, UA: 6.5

## 2015-06-06 MED ORDER — SULFAMETHOXAZOLE-TRIMETHOPRIM 800-160 MG PO TABS: 1.0000 | ORAL_TABLET | Freq: Two times a day (BID) | ORAL | Status: DC

## 2015-06-06 NOTE — Progress Notes (Signed)
Subjective:    Patient ID: Frances Hoffman, female    DOB: Jun 03, 1940, 75 y.o.   MRN: [id number]  HPI Started uti symptoms yesterday and worse this am   Burning to urinate  Uncomfortable  No fever  Nausea last night-no vomiting  No blood in urine   Results for orders placed or performed in visit on 06/06/15  POCT Urinalysis Dipstick (Automated)  Result Value Ref Range   Color, UA Orange    Clarity, UA Hazy    Glucose, UA Negative    Bilirubin, UA 1+    Ketones, UA Negative    Spec Grav, UA 1.010    Blood, UA Trace    pH, UA 6.5    Protein, UA Negative    Urobilinogen, UA 4.0    Nitrite, UA Positive    Leukocytes, UA large (3+) (A) Negative     Also took AZO otc at 5 am -helps with the pain   Frequency and urgency as well   Last uti was in Jan - bactim treat citrobacter   Hx of sepsis with uti once in the past  Does not have a urologist now   Also having leg pain -over a month  Sore/ache like she has over exercised  Wonders if it is from lipitor   Patient Active Problem List   Diagnosis Date Noted  . Leg pain 06/06/2015  . UTI (urinary tract infection) 06/06/2015  . Yeast vaginitis 05/11/2015  . Routine general medical examination at a health care facility 04/24/2015  . Overweight 03/15/2015  . Dysuria 02/01/2015  . Cholelithiasis 01/26/2015  . Abdominal pain, epigastric 01/24/2015  . Memory loss 09/22/2014  . Exercise intolerance 07/30/2014  . Estrogen deficiency 04/28/2014  . Tachycardia 04/03/2014  . Family history of heart disease 08/27/2013  . Dyspnea on exertion 08/18/2013  . Positional vertigo 08/18/2013  . Abdominal pain, left lower quadrant 09/09/2012  . Abnormal x-ray of neck 01/11/2012  . Encounter for Medicare annual wellness exam 11/23/2011  . Other screening mammogram 11/23/2011  . Insomnia 01/16/2011  . Osteopenia 11/26/2008  . NEOPLASM, MALIGNANT, BREAST, HX OF 07/01/2008  . Recurrent UTI 01/15/2008  . WEIGHT GAIN 01/15/2008  .  Hypothyroidism 12/12/2007  . Hyperlipidemia 12/12/2007  . Adjustment disorder with mixed anxiety and depressed mood 12/12/2007  . ADHESIVE CAPSULITIS 12/12/2007   Past Medical History  Diagnosis Date  . Cancer (HCC) 2000    breast had lumpectomy/radiation x36,mammo ,no chemo  . Depression   . GERD (gastroesophageal reflux disease)   . Hyperlipidemia   . Hypothyroid   . Frozen shoulder   . Osteopenia     BMD 2004, WNL 2008  . Shingles     chronic body pain, left side of body  . Dysuria-frequency syndrome     takes AZO  . Zoster     Hx of  . Hypothyroid   . Gall stones   . Recurrent UTI   . Wears glasses   . Cataract of both eyes   . Pneumonia   . Shortness of breath dyspnea     when climbing stairs only   Past Surgical History  Procedure Laterality Date  . Breast surgery  2000    lumpectomy  . Colonoscopy    . Multiple tooth extractions    . Cholecystectomy N/A 02/21/2015    Procedure: LAPAROSCOPIC CHOLECYSTECTOMY WITH INTRAOPERATIVE CHOLANGIOGRAM;  Surgeon: Gaynelle Adu, MD;  Location: [facility] OR;  Service: General;  Laterality: N/A;   Social History  Substance  Use Topics  . Smoking status: Never Smoker   . Smokeless tobacco: Never Used  . Alcohol Use: No   Family History  Problem Relation Age of Onset  . Osteoporosis Mother   . Parkinsonism Father   . Cancer Sister     breast CA  . Cancer Other     breast CA  . Heart disease Maternal Grandmother   . Heart disease Maternal Grandfather   . Coronary artery disease Sister    Allergies  Allergen Reactions  . Ciprofloxacin Other (See Comments)    Unk  . Citalopram Hydrobromide     Intrusive/ weird thoughts   . Flagyl [Metronidazole]   . Oxycodone Other (See Comments)    Headaches   Current Outpatient Prescriptions on File Prior to Visit  Medication Sig Dispense Refill  . atorvastatin (LIPITOR) 20 MG tablet Take 1 tablet (20 mg total) by mouth daily. 90 tablet 3  . b complex vitamins tablet Take 1 tablet by  mouth daily.    . calcium carbonate (OS-CAL - DOSED IN MG OF ELEMENTAL CALCIUM) 1250 (500 CA) MG tablet Take 1 tablet by mouth daily.    . Cholecalciferol (VITAMIN D3) 2000 UNITS capsule Take 2,000 Units by mouth daily.      [name] escitalopram (LEXAPRO) 20 MG tablet TAKE 1 TABLET BY MOUTH DAILY 30 tablet 11  . levothyroxine (SYNTHROID, LEVOTHROID) 75 MCG tablet Take 1 tablet (75 mcg total) by mouth daily. 30 tablet 11  . Multiple Vitamin (MULTIVITAMIN) tablet Take 1 tablet by mouth daily.      . Omega-3 Fatty Acids (FISH OIL PO) Take 1 capsule by mouth daily.    . pantoprazole (PROTONIX) 40 MG tablet Take 1 tablet (40 mg total) by mouth 2 (two) times daily before a meal. 60 tablet 1  . terconazole (TERAZOL 3) 0.8 % vaginal cream Apply to affected areas once daily 20 g 0  . vitamin C (ASCORBIC ACID) 500 MG tablet Take 500 mg by mouth daily.    . [DISCONTINUED] Diphenhydramine-APAP, sleep, (TYLENOL PM EXTRA STRENGTH PO) Take by mouth as needed.       No current facility-administered medications on file prior to visit.    Review of Systems Review of Systems  Constitutional: Negative for fever, appetite change,  and unexpected weight change.  Eyes: Negative for pain and visual disturbance.  Respiratory: Negative for cough and shortness of breath.   Cardiovascular: Negative for cp or palpitations    Gastrointestinal: Negative for nausea, diarrhea and constipation.  Genitourinary: pos for urgency and frequency. neg for hematuria or flank pain MSK pos for generalized leg aching  Skin: Negative for pallor or rash   Neurological: Negative for weakness, light-headedness, numbness and headaches.  Hematological: Negative for adenopathy. Does not bruise/bleed easily.  Psychiatric/Behavioral: Negative for dysphoric mood. The patient is not nervous/anxious.         Objective:   Physical Exam  Constitutional: She appears well-developed and well-nourished. No distress.  overwt and well app  HENT:    Head: Normocephalic and atraumatic.  Eyes: Conjunctivae and EOM are normal. Pupils are equal, round, and reactive to light.  Neck: Normal range of motion. Neck supple.  Cardiovascular: Normal rate, regular rhythm and normal heart sounds.   Pulmonary/Chest: Effort normal and breath sounds normal.  Abdominal: Soft. Bowel sounds are normal. She exhibits no distension. There is tenderness. There is no rebound.  No cva tenderness  Mild suprapubic tenderness  Musculoskeletal: She exhibits no edema or tenderness.  No  focal tenderness of legs- soft tissue or muscle or joint Nl gait  Lymphadenopathy:    She has no cervical adenopathy.  Neurological: She is alert. She has normal reflexes. Gait normal.  Skin: No rash noted. No pallor.  Psychiatric: She has a normal mood and affect.          Assessment & Plan:   Problem List Items Addressed This Visit      Genitourinary   UTI (urinary tract infection) - Primary    In pt who has had uti with sepsis in the past  Pos ua Cover with bactrim and pending cx Enc inc water intake Update if not starting to improve in several days  or if worsening   AZO ok for first 1-2 d Will update with cx (last uti was citrobacter)      Relevant Medications   sulfamethoxazole-trimethoprim (BACTRIM DS,SEPTRA DS) 800-160 MG tablet   Other Relevant Orders   Urine culture     Other   Dysuria   Relevant Orders   POCT Urinalysis Dipstick (Automated) (Completed)   Hyperlipidemia    Pt will hold lipitor for leg pain- unsure if this is a side effect  Will update in 2 weeks re: symptoms In the past she has been concerned re: memory problems from statins as well      Leg pain    Pt thinks this is due to lipitor  Will hold it 2 weeks and update re: symptoms She is exercising as well

## 2015-06-06 NOTE — Assessment & Plan Note (Signed)
Pt thinks this is due to lipitor  Will hold it 2 weeks and update re: symptoms She is exercising as well

## 2015-06-06 NOTE — Patient Instructions (Signed)
Drink at least 8 servings of water a day to prevent utis  Try to leave a specimen and then we will call in an antibiotic and send it for a culture  Then we will get back to you about culture results  For leg soreness - hold lipitor for 2 weeks and then let me know how symptoms are Avoid red meat/ fried foods/ egg yolks/ fatty breakfast meats/ butter, cheese and high fat dairy/ and shellfish

## 2015-06-06 NOTE — Assessment & Plan Note (Signed)
In pt who has had uti with sepsis in the past  Pos ua Cover with bactrim and pending cx Enc inc water intake Update if not starting to improve in several days  or if worsening   AZO ok for first 1-2 d Will update with cx (last uti was citrobacter)

## 2015-06-06 NOTE — Progress Notes (Signed)
Pre visit review using our clinic review tool, if applicable. No additional management support is needed unless otherwise documented below in the visit note. 

## 2015-06-06 NOTE — Assessment & Plan Note (Signed)
Pt will hold lipitor for leg pain- unsure if this is a side effect  Will update in 2 weeks re: symptoms In the past she has been concerned re: memory problems from statins as well

## 2015-06-08 LAB — URINE CULTURE: Colony Count: 40000

## 2015-06-13 ENCOUNTER — Other Ambulatory Visit (INDEPENDENT_AMBULATORY_CARE_PROVIDER_SITE_OTHER): Payer: Medicare HMO

## 2015-06-13 DIAGNOSIS — E039 Hypothyroidism, unspecified: Secondary | ICD-10-CM

## 2015-06-13 LAB — TSH: TSH: 3.47 u[IU]/mL (ref 0.35–4.50)

## 2015-06-14 ENCOUNTER — Encounter: Payer: Self-pay | Admitting: *Deleted

## 2015-06-24 ENCOUNTER — Telehealth: Payer: Self-pay

## 2015-06-24 NOTE — Telephone Encounter (Signed)
Pt has recurrent UTI;  pt was seen on 06/06/15 with UTI and given Bactrim DS; pt symptoms completely went away. On 06/24/15 pt started with burning upon urination; no frequency and no fever; pt has taken AZO and no burning sensation upon urination now but pt know she has UTI and request abx to [place]. Pt request cb.

## 2015-06-24 NOTE — Telephone Encounter (Signed)
Patient will need to be seen. Unable to treat without checking a UA & culture.

## 2015-06-24 NOTE — Telephone Encounter (Signed)
Pt notified as instructed and pt scheduled appt with Dr Diona Browner at [facility] 06/25/15 at 12 noon.

## 2015-06-25 ENCOUNTER — Encounter: Payer: Self-pay | Admitting: Family Medicine

## 2015-06-25 ENCOUNTER — Ambulatory Visit (INDEPENDENT_AMBULATORY_CARE_PROVIDER_SITE_OTHER): Payer: Medicare HMO | Admitting: Family Medicine

## 2015-06-25 ENCOUNTER — Other Ambulatory Visit (HOSPITAL_COMMUNITY)
Admission: RE | Admit: 2015-06-25 | Discharge: 2015-06-25 | Disposition: A | Discharge status: Home / Self Care | Payer: Medicare HMO | Source: Other Acute Inpatient Hospital | Attending: Family Medicine | Admitting: Family Medicine

## 2015-06-25 VITALS — BP 118/70 | HR 72 | Temp 97.7°F | Ht 63.0 in | Wt 166.0 lb

## 2015-06-25 DIAGNOSIS — R300 Dysuria: Secondary | ICD-10-CM | POA: Diagnosis not present

## 2015-06-25 LAB — POCT URINALYSIS DIPSTICK
BILIRUBIN UA: NEGATIVE
Blood, UA: NEGATIVE
Glucose, UA: NEGATIVE
KETONES UA: NEGATIVE
LEUKOCYTES UA: NEGATIVE
Nitrite, UA: POSITIVE
Protein, UA: NEGATIVE
SPEC GRAV UA: 1.015
Urobilinogen, UA: 1
pH, UA: 6.5

## 2015-06-25 NOTE — Patient Instructions (Addendum)
No clear UTI, burning likely secondary to irritation from diarrhea. Increase water intake.  We will call you with the urine culture results. Follow up PCP if not continuing to improve.

## 2015-06-25 NOTE — Addendum Note (Signed)
Addended by: Aviva Signs M on: 06/25/2015 02:20 PM   Modules accepted: Orders, SmartSet

## 2015-06-25 NOTE — Progress Notes (Signed)
Pre visit review using our clinic review tool, if applicable. No additional management support is needed unless otherwise documented below in the visit note. 

## 2015-06-25 NOTE — Assessment & Plan Note (Signed)
No clear UTI  On UA but nitrate increased. Send urine for culture. Push fluids, use cranberry.

## 2015-06-25 NOTE — Progress Notes (Signed)
Subjective:    Patient ID: Frances Hoffman, female    DOB: 1940-12-01, 75 y.o.   MRN: [id number]  Dysuria  This is a new problem. The current episode started yesterday. The problem has been gradually improving. The quality of the pain is described as burning. The pain is moderate. There has been no fever. She is not sexually active. There is no history of pyelonephritis. Associated symptoms include chills. Pertinent negatives include no discharge, flank pain, frequency, hematuria, hesitancy, nausea, possible pregnancy, urgency or vomiting. Associated symptoms comments: Episode of diarrhea . She has tried nothing (Recent amoxicilin for ST 2 months ago) for the symptoms. The treatment provided no relief. Her past medical history is significant for recurrent UTIs. There is no history of catheterization or a urological procedure.    Social History /Family History/Past Medical History reviewed and updated if needed.   Review of Systems  Constitutional: Positive for chills.  Gastrointestinal: Negative for nausea and vomiting.  Genitourinary: Positive for dysuria. Negative for hesitancy, urgency, frequency, hematuria and flank pain.       Objective:   Physical Exam  Constitutional: Vital signs are normal. She appears well-developed and well-nourished. She is cooperative.  Non-toxic appearance. She does not appear ill. No distress.  HENT:  Head: Normocephalic.  Right Ear: Hearing, tympanic membrane, external ear and ear canal normal. Tympanic membrane is not erythematous, not retracted and not bulging.  Left Ear: Hearing, tympanic membrane, external ear and ear canal normal. Tympanic membrane is not erythematous, not retracted and not bulging.  Nose: No mucosal edema or rhinorrhea. Right sinus exhibits no maxillary sinus tenderness and no frontal sinus tenderness. Left sinus exhibits no maxillary sinus tenderness and no frontal sinus tenderness.  Mouth/Throat: Uvula is midline, oropharynx is clear  and moist and mucous membranes are normal.  Eyes: Conjunctivae, EOM and lids are normal. Pupils are equal, round, and reactive to light. Lids are everted and swept, no foreign bodies found.  Neck: Trachea normal and normal range of motion. Neck supple. Carotid bruit is not present. No thyroid mass and no thyromegaly present.  Cardiovascular: Normal rate, regular rhythm, S1 normal, S2 normal, normal heart sounds, intact distal pulses and normal pulses.  Exam reveals no gallop and no friction rub.   No murmur heard. Pulmonary/Chest: Effort normal and breath sounds normal. No tachypnea. No respiratory distress. She has no decreased breath sounds. She has no wheezes. She has no rhonchi. She has no rales.  Abdominal: Soft. Normal appearance and bowel sounds are normal. There is no tenderness.  Neurological: She is alert.  Skin: Skin is warm, dry and intact. No rash noted.  Psychiatric: Her speech is normal and behavior is normal. Judgment and thought content normal. Her mood appears not anxious. Cognition and memory are normal. She does not exhibit a depressed mood.          Assessment & Plan:

## 2015-06-28 LAB — URINE CULTURE

## 2015-06-29 ENCOUNTER — Telehealth: Payer: Self-pay | Admitting: *Deleted

## 2015-06-29 MED ORDER — NITROFURANTOIN MONOHYD MACRO 100 MG PO CAPS: 100.0000 mg | ORAL_CAPSULE | Freq: Two times a day (BID) | ORAL | Status: DC

## 2015-06-29 NOTE — Telephone Encounter (Signed)
Pt notified of Dr. Marliss Coots comments/ instructions and verbalized understanding, pt will stay off of med and wait until we contact her with Dr. Marliss Coots plan/instructions

## 2015-06-29 NOTE — Telephone Encounter (Signed)
[name] called back to say she would like to go ahead and have Dr Diona Browner send in the antibiotic for the UTI.  Will send in Macrobid 100 mg one twice a day x 7 days as instructed by Dr. Diona Browner (see result note from urine culture 06/25/15). [name] also wanted to update Dr. Glori Bickers on the leg pain she was having.  She states Dr. Glori Bickers told her to stop her cholesterol medication for 2 weeks to see if pain went away.  She states she has not had any pain since stopping the Lipitor. .  She hasn't restarted it and wants to know what Dr. Glori Bickers recommends.

## 2015-06-29 NOTE — Telephone Encounter (Signed)
Please send in the macrobid as directed in note -thanks  Tell her to stay off cholesterol med  Then send this back to me so I can review it and make a further plan

## 2015-06-29 NOTE — Telephone Encounter (Signed)
Please have her call her ins co to see if crestor is covered-if so - I would like to try a very low dose of that

## 2015-06-30 NOTE — Telephone Encounter (Signed)
Left voicemail asking pt to check with ins to see if Crestor is covered and let us know, and advise Dr. Glori Bickers wants to try a low dose of that med if it's covered

## 2015-07-01 NOTE — Telephone Encounter (Signed)
Pt left v/m; cost to pt for Crestor is $400.00 for 1st month and then $100.00 each month following; pt thinks that is too expensive. Pt request cb.

## 2015-07-03 NOTE — Telephone Encounter (Signed)
Nope-that won't work out.  It did go generic I think somewhat recently so perhaps the price will come down for her in the next 6-12 months- have her continue to watch her coverage year to year Try to eat as low fat as possible

## 2015-07-04 NOTE — Telephone Encounter (Signed)
Left detailed voicemail with Dr. Marliss Coots comments/ instructions and to update [place] if med becomes more affordable and advise to watch her diet/low fat diet

## 2015-07-29 ENCOUNTER — Encounter: Payer: Self-pay | Admitting: Family Medicine

## 2015-07-29 ENCOUNTER — Ambulatory Visit (INDEPENDENT_AMBULATORY_CARE_PROVIDER_SITE_OTHER): Payer: Medicare HMO | Admitting: Family Medicine

## 2015-07-29 ENCOUNTER — Telehealth: Payer: Self-pay | Admitting: Family Medicine

## 2015-07-29 VITALS — BP 130/78 | HR 83 | Temp 98.0°F | Ht 63.0 in | Wt 170.5 lb

## 2015-07-29 DIAGNOSIS — R159 Full incontinence of feces: Secondary | ICD-10-CM | POA: Diagnosis not present

## 2015-07-29 DIAGNOSIS — R195 Other fecal abnormalities: Secondary | ICD-10-CM | POA: Diagnosis not present

## 2015-07-29 NOTE — Progress Notes (Signed)
Pre visit review using our clinic review tool, if applicable. No additional management support is needed unless otherwise documented below in the visit note. 

## 2015-07-29 NOTE — Telephone Encounter (Signed)
[place]  Patient Name: Frances Hoffman  DOB: 01-25-1941    Initial Comment [name] states, she has blood in her stool, trying to make an appt.    Nurse Assessment  Nurse: Wayne Sever, RN, Tillie Rung Date/Time (Eastern Time): 07/29/2015 9:07:00 AM  Confirm and document reason for call. If symptomatic, describe symptoms. You must click the next button to save text entered. ---[name] states when she used the restroom a few days ago she wiped and there was some blood. Denies any bleeding since that one incident. Denies any symptoms.  Has the patient traveled out of the country within the last 30 days? ---No  Does the patient have any new or worsening symptoms? ---Yes  Will a triage be completed? ---Yes  Related visit to physician within the last 2 weeks? ---No  Does the PT have any chronic conditions? (i.e. diabetes, asthma, etc.) ---Yes  List chronic conditions. ---Thyroid Issues, Sleep Issues  Is this a behavioral health or substance abuse call? ---No     Guidelines    Guideline Title Affirmed Question Affirmed Notes  Rectal Bleeding MILD rectal bleeding (more than just a few drops or streaks)    Final Disposition User   See PCP When Office is Open (within 3 days) Wayne Sever, RN, Tillie Rung    Comments  Scheduled today, 05/26 at 4pm to see Dr Glori Bickers   Referrals  REFERRED TO PCP OFFICE   Disagree/Comply: Comply

## 2015-07-29 NOTE — Telephone Encounter (Signed)
I will see her then  

## 2015-07-29 NOTE — Telephone Encounter (Signed)
Pt has appt with Dr Glori Bickers on 07/29/15 at 4 PM.

## 2015-07-29 NOTE — Patient Instructions (Signed)
Stop up front on the way out  We will start the referral process to GI  Try fiber (citrucel) over the counter to slow down loose stool (if it does not work don't continue it)

## 2015-07-29 NOTE — Progress Notes (Signed)
Subjective:    Patient ID: Frances Hoffman, female    DOB: 07/22/1940, 75 y.o.   MRN: [id number]  HPI Here for blood in her stool  Tiny streak -one time only  Hemorrhoids are not bleeding right now   Has had fecal incontinence -at night (without cramping or urge to go)  Off and on for a number of years- has returned   Stools are usually pretty loose anyway  Tends to have 2-3 per day    occ she gets sore in LLQ -not now   Colonoscopy 2010 - normal   No fam hx of colon problems or colitis   No food intolerances   No rectal tears or pain   Patient Active Problem List   Diagnosis Date Noted  . Fecal incontinence 07/29/2015  . Heme positive stool 07/29/2015  . Leg pain 06/06/2015  . UTI (urinary tract infection) 06/06/2015  . Yeast vaginitis 05/11/2015  . Routine general medical examination at a health care facility 04/24/2015  . Overweight 03/15/2015  . Dysuria 02/01/2015  . Cholelithiasis 01/26/2015  . Abdominal pain, epigastric 01/24/2015  . Memory loss 09/22/2014  . Exercise intolerance 07/30/2014  . Estrogen deficiency 04/28/2014  . Tachycardia 04/03/2014  . Family history of heart disease 08/27/2013  . Dyspnea on exertion 08/18/2013  . Positional vertigo 08/18/2013  . Abdominal pain, left lower quadrant 09/09/2012  . Abnormal x-ray of neck 01/11/2012  . Encounter for Medicare annual wellness exam 11/23/2011  . Other screening mammogram 11/23/2011  . Insomnia 01/16/2011  . Osteopenia 11/26/2008  . NEOPLASM, MALIGNANT, BREAST, HX OF 07/01/2008  . Recurrent UTI 01/15/2008  . WEIGHT GAIN 01/15/2008  . Hypothyroidism 12/12/2007  . Hyperlipidemia 12/12/2007  . Adjustment disorder with mixed anxiety and depressed mood 12/12/2007  . ADHESIVE CAPSULITIS 12/12/2007   Past Medical History  Diagnosis Date  . Cancer (HCC) 2000    breast had lumpectomy/radiation x36,mammo ,no chemo  . Depression   . GERD (gastroesophageal reflux disease)   . Hyperlipidemia   .  Hypothyroid   . Frozen shoulder   . Osteopenia     BMD 2004, WNL 2008  . Shingles     chronic body pain, left side of body  . Dysuria-frequency syndrome     takes AZO  . Zoster     Hx of  . Hypothyroid   . Gall stones   . Recurrent UTI   . Wears glasses   . Cataract of both eyes   . Pneumonia   . Shortness of breath dyspnea     when climbing stairs only   Past Surgical History  Procedure Laterality Date  . Breast surgery  2000    lumpectomy  . Colonoscopy    . Multiple tooth extractions    . Cholecystectomy N/A 02/21/2015    Procedure: LAPAROSCOPIC CHOLECYSTECTOMY WITH INTRAOPERATIVE CHOLANGIOGRAM;  Surgeon: Gaynelle Adu, MD;  Location: [facility] OR;  Service: General;  Laterality: N/A;   Social History  Substance Use Topics  . Smoking status: Never Smoker   . Smokeless tobacco: Never Used  . Alcohol Use: No   Family History  Problem Relation Age of Onset  . Osteoporosis Mother   . Parkinsonism Father   . Cancer Sister     breast CA  . Cancer Other     breast CA  . Heart disease Maternal Grandmother   . Heart disease Maternal Grandfather   . Coronary artery disease Sister    Allergies  Allergen Reactions  . Ciprofloxacin  Other (See Comments)    Unk  . Citalopram Hydrobromide     Intrusive/ weird thoughts   . Flagyl [Metronidazole]   . Oxycodone Other (See Comments)    Headaches   Current Outpatient Prescriptions on File Prior to Visit  Medication Sig Dispense Refill  . atorvastatin (LIPITOR) 20 MG tablet     . b complex vitamins tablet Take 1 tablet by mouth daily.    . calcium carbonate (OS-CAL - DOSED IN MG OF ELEMENTAL CALCIUM) 1250 (500 CA) MG tablet Take 1 tablet by mouth daily.    . Cholecalciferol (VITAMIN D3) 2000 UNITS capsule Take 2,000 Units by mouth daily.      [name] escitalopram (LEXAPRO) 20 MG tablet TAKE 1 TABLET BY MOUTH DAILY 30 tablet 11  . levothyroxine (SYNTHROID, LEVOTHROID) 75 MCG tablet Take 1 tablet (75 mcg total) by mouth daily. 30 tablet 11   . Multiple Vitamin (MULTIVITAMIN) tablet Take 1 tablet by mouth daily.      . Omega-3 Fatty Acids (FISH OIL PO) Take 1 capsule by mouth daily.    . pantoprazole (PROTONIX) 40 MG tablet Take 1 tablet (40 mg total) by mouth 2 (two) times daily before a meal. 60 tablet 1  . terconazole (TERAZOL 3) 0.8 % vaginal cream Apply to affected areas once daily 20 g 0  . vitamin C (ASCORBIC ACID) 500 MG tablet Take 500 mg by mouth daily.    . [DISCONTINUED] Diphenhydramine-APAP, sleep, (TYLENOL PM EXTRA STRENGTH PO) Take by mouth as needed.       No current facility-administered medications on file prior to visit.     Review of Systems Review of Systems  Constitutional: Negative for fever, appetite change, fatigue and unexpected weight change.  Eyes: Negative for pain and visual disturbance.  Respiratory: Negative for cough and shortness of breath.   Cardiovascular: Negative for cp or palpitations    Gastrointestinal: Negative for nausea, melena, constipation, or rectal pain  Genitourinary: Negative for urgency and frequency. neg for hematuria  Skin: Negative for pallor or rash   Neurological: Negative for weakness, light-headedness, numbness and headaches.  Hematological: Negative for adenopathy. Does not bruise/bleed easily.  Psychiatric/Behavioral: Negative for dysphoric mood. The patient is not nervous/anxious.         Objective:   Physical Exam  Constitutional: She appears well-developed and well-nourished. No distress.  overwt and well app  HENT:  Head: Normocephalic and atraumatic.  Mouth/Throat: Oropharynx is clear and moist.  Eyes: Conjunctivae and EOM are normal. Pupils are equal, round, and reactive to light. No scleral icterus.  Neck: Normal range of motion. Neck supple.  Cardiovascular: Normal rate, regular rhythm and normal heart sounds.   Pulmonary/Chest: Effort normal and breath sounds normal. No respiratory distress. She has no wheezes. She has no rales.  Abdominal: Soft.  Bowel sounds are normal. She exhibits no distension and no mass. There is no tenderness. There is no rebound and no guarding.  Genitourinary: Rectal exam shows no external hemorrhoid, no internal hemorrhoid, no fissure, no mass and no tenderness. Guaiac positive stool.  Rectal tone is not very strong One skin tag noted  Stool is trace heme pos   Lymphadenopathy:    She has no cervical adenopathy.  Neurological: She is alert.  Skin: Skin is warm and dry. No erythema. No pallor.  Psychiatric: She has a normal mood and affect.          Assessment & Plan:   Problem List Items Addressed This Visit  Other   Heme positive stool    Report of blood in stool at home with faint heme pos stool today  Also loose stools and night time fecal incontinence Ref to GI      Relevant Orders   Ambulatory referral to Gastroenterology   Fecal incontinence - Primary    Pt is having this at nighttime- while asleep-which is unusual  Does not rec urge to go until it wakes her up  Also loose stools in daytime when she has some Hx of ? IBS Recommend fiber supplement Rev last colonoscopy 2010  Will ref to GI      Relevant Orders   Ambulatory referral to Gastroenterology

## 2015-07-31 NOTE — Assessment & Plan Note (Signed)
Pt is having this at nighttime- while asleep-which is unusual  Does not rec urge to go until it wakes her up  Also loose stools in daytime when she has some Hx of ? IBS Recommend fiber supplement Rev last colonoscopy 2010  Will ref to GI

## 2015-07-31 NOTE — Assessment & Plan Note (Signed)
Report of blood in stool at home with faint heme pos stool today  Also loose stools and night time fecal incontinence Ref to GI

## 2015-08-24 ENCOUNTER — Encounter: Payer: Self-pay | Admitting: Family Medicine

## 2015-08-24 ENCOUNTER — Ambulatory Visit (INDEPENDENT_AMBULATORY_CARE_PROVIDER_SITE_OTHER): Payer: Medicare HMO | Admitting: Family Medicine

## 2015-08-24 VITALS — BP 92/56 | HR 75 | Ht 63.0 in | Wt 166.8 lb

## 2015-08-24 DIAGNOSIS — E038 Other specified hypothyroidism: Secondary | ICD-10-CM

## 2015-08-24 DIAGNOSIS — E663 Overweight: Secondary | ICD-10-CM | POA: Diagnosis not present

## 2015-08-24 DIAGNOSIS — E785 Hyperlipidemia, unspecified: Secondary | ICD-10-CM

## 2015-08-24 DIAGNOSIS — R195 Other fecal abnormalities: Secondary | ICD-10-CM

## 2015-08-24 DIAGNOSIS — Z853 Personal history of malignant neoplasm of breast: Secondary | ICD-10-CM

## 2015-08-24 DIAGNOSIS — F4323 Adjustment disorder with mixed anxiety and depressed mood: Secondary | ICD-10-CM

## 2015-08-24 NOTE — Patient Instructions (Addendum)
Gym goal should be 3 d /week.   Drink at least 80oz water per day.     Guidelines for a Low Cholesterol, Low Saturated Fat Diet   Fats - Limit total intake of fats and oils. - Avoid butter, stick margarine, shortening, lard, palm and coconut oils. - Limit mayonnaise, salad dressings, gravies and sauces, unless they are homemade with low-fat ingredients. - Limit chocolate. - Choose low-fat and nonfat products, such as low-fat mayonnaise, low-fat or non-hydrogenated peanut butter, low-fat or fat-free salad dressings and nonfat gravy. - Use vegetable oil, such as canola or olive oil. - Look for margarine that does not contain trans fatty acids. - Use nuts in moderate amounts. - Read ingredient labels carefully to determine both amount and type of fat present in foods. Limit saturated and trans fats! - Avoid high-fat processed and convenience foods.  Meats and Meat Alternatives - Choose fish, chicken, [place] and lean meats. - Use dried beans, peas, lentils and tofu. - Limit egg yolks to three to four per week. - If you eat red meat, limit to no more than three servings per week and choose loin or round cuts. - Avoid fatty meats, such as bacon, sausage, franks, luncheon meats and ribs. - Avoid all organ meats, including liver.  Dairy - Choose nonfat or low-fat milk, yogurt and cottage cheese. - Most cheeses are high in fat. Choose cheeses made from non-fat milk, such as mozzarella and ricotta cheese. - Choose light or fat-free cream cheese and sour cream. - Avoid cream and sauces made with cream.  Fruits and Vegetables - Eat a wide variety of fruits and vegetables. - Use lemon juice, vinegar or "mist" olive oil on vegetables. - Avoid adding sauces, fat or oil to vegetables.  Breads, Cereals and Grains - Choose whole-grain breads, cereals, pastas and rice. - Avoid high-fat snack foods, such as granola, cookies, pies, pastries, doughnuts and croissants.  Cooking Tips - Avoid  deep fried foods. - Trim visible fat off meats and remove skin from poultry before cooking. - Bake, broil, boil, poach or roast poultry, fish and lean meats. - Drain and discard fat that drains out of meat as you cook it. - Add little or no fat to foods. - Use vegetable oil sprays to grease pans for cooking or baking. - Steam vegetables. - Use herbs or no-oil marinades to flavor foods.      Guidelines for a Low Sodium Diet   Low Sodium Diet A main source of sodium is table salt. The average American eats five or more teaspoons of salt each day. This is about 20 times as much as the body needs. In fact, your body needs only 1/4 teaspoon of salt every day. Sodium is found naturally in foods, but a lot of it is added during processing and preparation. Many foods that do not taste salty may still be high in sodium. Large amounts of sodium can be hidden in canned, processed and convenience foods. And sodium can be found in many foods that are served at [employer].  Sodium controls fluid balance in our bodies and maintains blood volume and blood pressure. Eating too much sodium may raise blood pressure and cause fluid retention, which could lead to swelling of the legs and feet or other health issues.  When limiting sodium in your diet, a common target is to eat less than 2,000 milligrams of sodium per day.   General Guidelines for Cutting Down on Salt Eliminate salty foods from  your diet and reduce the amount of salt used in cooking. Sea salt is no better than regular salt.  Choose low sodium foods. Many salt-free or reduced salt products are available. When reading food labels, low sodium is defined as 140 mg of sodium per serving.  Salt substitutes are sometimes made from potassium, so read the label. If you are on a low potassium diet, then check with your doctor before using those salt substitutes.  Be creative and season your foods with spices, herbs, lemon, garlic, ginger,  vinegar and pepper. Remove the salt shaker from the table.  Read ingredient labels to identify foods high in sodium. Items with 400 mg or more of sodium are high in sodium. High sodium food additives include salt, brine, or other items that say sodium, such as monosodium glutamate.  Eat more home-cooked meals. Foods cooked from scratch are naturally lower in sodium than most instant and boxed mixes.  Don't use softened water for cooking and drinking since it contains added salt.  Avoid medications which contain sodium such as [employer].  For more information; food composition books are available which tell how much sodium is in food. Online sources such as www.calorieking.com also list amounts.     Meats, Poultry, Fish, Legumes, Eggs and Nuts  High-Sodium Foods: Smoked, cured, salted or canned meat, fish or poultry including bacon, cold cuts, ham, frankfurters, sausage, sardines, caviar and anchovies Frozen breaded meats and dinners, such as burritos and pizza Canned entrees, such as ravioli, spam and chili Salted nuts Beans canned with salt added  Low-Sodium Alternatives: Any fresh or frozen beef, lamb, pork, poultry and fish Eggs and egg substitutes Low-sodium peanut butter Dry peas and beans (not canned) Low-sodium canned fish Drained, water or oil packed canned fish or poultry   Dairy Products  High-Sodium Foods: Buttermilk Regular and processed cheese, cheese spreads and sauces Cottage cheese  Low-Sodium Alternatives: Milk, yogurt, ice cream and ice milk Low-sodium cheeses, cream cheese, ricotta cheese and mozzarella   Breads, Grains and Cereals  High-Sodium Foods: Bread and rolls with salted tops Quick breads, self-rising flour, biscuit, pancake and waffle mixes Pizza, croutons and salted crackers Prepackaged, processed mixes for potatoes, rice, pasta and stuffing  Low-Sodium Alternatives: Breads, bagels and rolls without salted  tops Muffins and most ready-to-eat cereals All rice and pasta, but do not to add salt when cooking Low-sodium corn and flour tortillas and noodles Low-sodium crackers and breadsticks Unsalted popcorn, chips and pretzels      Vegetables and Fruits  High-Sodium Foods: Regular canned vegetables and vegetable juices Olives, pickles, sauerkraut and other pickled vegetables Vegetables made with ham, bacon or salted pork Packaged mixes, such as scalloped or au gratin potatoes, frozen hash browns and Tater Tots Commercially prepared pasta and tomato sauces and salsa  Low-Sodium Alternatives: Fresh and frozen vegetables without sauces Low-sodium canned vegetables, sauces and juices Fresh potatoes, frozen [place] fries and instant mashed potatoes Low-salt tomato or V-8 juice. Most fresh, frozen and canned fruit Dried fruits   Soups  High-Sodium Foods: Regular canned and dehydrated soup, broth and bouillon Cup of noodles and seasoned ramen mixes  Low-Sodium Alternatives: Low-sodium canned and dehydrated soups, broth and bouillon Homemade soups without added salt   Fats, Desserts and Sweets  High-Sodium Foods: Soy sauce, seasoning salt, other sauces and marinades Bottled salad dressings, regular salad dressing with bacon bits Salted butter or margarine Instant pudding and cake Large portions of ketchup, mustard  Low-Sodium Alternatives: Vinegar, unsalted  butter or margarine Vegetable oils and low sodium sauces and salad dressings Mayonnaise All desserts made without salt

## 2015-08-24 NOTE — Progress Notes (Signed)
Frances Hoffman, D.O. Primary care at [facility]  Subjective:    CC: New pt, here to establish care.   HPI: Frances Hoffman is a pleasant 75 y.o. female who presents to [place] at [facility] today  To establish care. She goes by [name].    She does not have any acute issues she needs addressed.    - for past 80yrs- bp always runs low and even 90's/ 40's and never had dizziness etc  Patient does occasionally go to the gym, the [employer], which has a pool and indoor track.  She has been married for 23 years to [place].  She has 2 sons, one age 49 and one age 13.  She is currently retired but worked in [employer] for several years prior.  She has a history of hyperlipidemia.  She stopped Lipitor approximately one month ago via direction of her prior PCP.  She tries to eat a prudent diet and move more now.  History of recurrent urinary tract infections.  She gets about 3 per year.  She has never seen a urologist for this.   She would like to lose some wt  GERD: Sx stable. tol med well.    Stress\depression:  Symptoms are stable on Lexapro many years.  Hypothyroidism: Found to be hypothyroid many yrs AGO, stable on meds.    Past Medical History  Diagnosis Date  . Depression   . GERD (gastroesophageal reflux disease)   . Hyperlipidemia   . Hypothyroid   . Frozen shoulder   . Osteopenia     BMD 2004, WNL 2008  . Shingles     chronic body pain, left side of body  . Dysuria-frequency syndrome     takes AZO  . Zoster     Hx of  . Hypothyroid   . Gall stones   . Recurrent UTI   . Wears glasses   . Cataract of both eyes   . Pneumonia   . Shortness of breath dyspnea     when climbing stairs only  . Cancer [facility]) 2000    breast had lumpectomy/radiation x36,mammo ,no chemo    Past Surgical History  Procedure Laterality Date  . Breast surgery  2000    lumpectomy  . Colonoscopy    . Multiple tooth extractions    . Cholecystectomy N/A 02/21/2015      Procedure: LAPAROSCOPIC CHOLECYSTECTOMY WITH INTRAOPERATIVE CHOLANGIOGRAM;  Surgeon: Greer Pickerel, MD;  Location: [facility] OR;  Service: General;  Laterality: N/A;    Family History  Problem Relation Age of Onset  . Osteoporosis Mother   . Parkinsonism Father   . Cancer Sister     breast CA  . Cancer Other     breast CA  . Heart disease Maternal Grandmother   . Heart disease Maternal Grandfather   . Coronary artery disease Sister     History  Drug Use No  ,  History  Alcohol Use No  ,  History  Smoking status  . Never Smoker   Smokeless tobacco  . Never Used  ,  History  Sexual Activity  . Sexual Activity: No    Patient's Medications  New Prescriptions   No medications on file  Previous Medications   B COMPLEX VITAMINS TABLET    Take 1 tablet by mouth daily.   CALCIUM CARBONATE (OS-CAL - DOSED IN MG OF ELEMENTAL CALCIUM) 1250 (500 CA) MG TABLET    Take  1 tablet by mouth daily.   CHOLECALCIFEROL (VITAMIN D3) 2000 UNITS CAPSULE    Take 2,000 Units by mouth daily.     ESCITALOPRAM (LEXAPRO) 20 MG TABLET    TAKE 1 TABLET BY MOUTH DAILY   LEVOTHYROXINE (SYNTHROID, LEVOTHROID) 75 MCG TABLET    Take 1 tablet (75 mcg total) by mouth daily.   MULTIPLE VITAMIN (MULTIVITAMIN) TABLET    Take 1 tablet by mouth daily.     OMEGA-3 FATTY ACIDS (FISH OIL PO)    Take 1 capsule by mouth daily.   PANTOPRAZOLE (PROTONIX) 40 MG TABLET    Take 1 tablet (40 mg total) by mouth 2 (two) times daily before a meal.   VITAMIN C (ASCORBIC ACID) 500 MG TABLET    Take 500 mg by mouth daily.  Modified Medications   No medications on file  Discontinued Medications   ATORVASTATIN (LIPITOR) 20 MG TABLET       TERCONAZOLE (TERAZOL 3) 0.8 % VAGINAL CREAM    Apply to affected areas once daily    ALLERGIES: Ciprofloxacin; Citalopram hydrobromide; Flagyl; and Oxycodone  Review of Systems:   ( Completed via her Adult Medical History Intake form today ) General:   Denies fever, chills, appetite changes,  unexplained weight loss.  Optho/Auditory:   Denies visual changes, blurred vision/LOV, ringing in ears/ diff hearing Respiratory:   Denies SOB, DOE, cough, wheezing.  Cardiovascular:   Denies chest pain, palpitations, new onset peripheral edema  Gastrointestinal:   Denies nausea, vomiting, diarrhea.  Genitourinary:    Denies dysuria, increased frequency, flank pain.  Endocrine:     Denies hot or cold intolerance, polyuria, polydipsia. Musculoskeletal:  Denies unexplained myalgias, joint swelling, arthralgias, gait problems.  Skin:  Denies rash, suspicious lesions or new/ changes in moles Neurological:    Denies dizziness, syncope, unexplained weakness, lightheadedness, numbness  Psychiatric/Behavioral:   Denies mood changes, suicidal or homicidal ideations, hallucinations    Objective:   Blood pressure 92/56, pulse 75, height 5\' 3"  (1.6 m), weight 166 lb 12.8 oz (75.66 kg). Body mass index is 29.55 kg/(m^2).  General: Well Developed, well nourished, and in no acute distress.  Neuro: Alert and oriented x3, extra-ocular muscles intact, sensation grossly intact.  HEENT: Normocephalic, atraumatic, pupils equal round reactive to light, neck supple, no gross masses, no carotid bruits, no JVD apprec Skin: no gross suspicious lesions or rashes  Cardiac: Regular rate and rhythm, no murmurs rubs or gallops.  Respiratory: Essentially clear to auscultation bilaterally. Not using accessory muscles, speaking in full sentences.  Abdominal: Soft, not grossly distended Musculoskeletal: Ambulates w/o diff, FROM * 4 ext.  Vasc: less 2 sec cap RF, warm and pink  Psych:  No HI/SI, judgement and insight good.    Impression and Recommendations:    The patient was counselled, risk factors were discussed, anticipatory guidance given. Hypothyroidism Patient symptoms are stable.  Recent labs were done in April 2017, which were stable.  Continue current meds.  Hyperlipidemia Patient has been off her  statin for approximately one month.  I recommend she be off them for at least 4 months, preferably 6 months before we recheck her cholesterol levels.  Discussed with patient lifestyle modifications including exercising more and prudent diet.  Handouts provided.  Adjustment disorder with mixed anxiety and depressed mood Continue Lexapro at current dose, symptoms nonexistent and patient is emotionally stable.  NEOPLASM, MALIGNANT, BREAST, HX OF Pt goes for screening Mammograms regularly  Overweight Discussed with patient prudent diet and moving more.  She agrees to go to the [facility] and exercise at least 3 days per week.   She will make a conscientious effort to drink more water daily.  Heme positive stool Recently seen last month by her prior PCP, Dr. Rica Mote for heme positive stools.   She was referred to gastroenterology.  She does not complain of this today.    Gross side effects, risk and benefits, and alternatives of medications discussed with patient.  Patient is aware that all medications have potential side effects and we are unable to predict every sideeffect or drug-drug interaction that may occur.  Expresses verbal understanding and consents to current therapy plan and treatment regiment.  Note: This document was prepared using Dragon voice recognition software and may include unintentional dictation errors.

## 2015-08-28 NOTE — Assessment & Plan Note (Signed)
Continue Lexapro at current dose, symptoms nonexistent and patient is emotionally stable.

## 2015-08-28 NOTE — Assessment & Plan Note (Signed)
Discussed with patient prudent diet and moving more.  She agrees to go to the [facility] and exercise at least 3 days per week.   She will make a conscientious effort to drink more water daily.

## 2015-08-28 NOTE — Assessment & Plan Note (Signed)
Patient symptoms are stable.  Recent labs were done in April 2017, which were stable.  Continue current meds.

## 2015-08-28 NOTE — Assessment & Plan Note (Signed)
Recently seen last month by her prior PCP, Dr. Rica Mote for heme positive stools.   She was referred to gastroenterology.  She does not complain of this today.

## 2015-08-28 NOTE — Assessment & Plan Note (Signed)
Patient has been off her statin for approximately one month.  I recommend she be off them for at least 4 months, preferably 6 months before we recheck her cholesterol levels.  Discussed with patient lifestyle modifications including exercising more and prudent diet.  Handouts provided.

## 2015-08-28 NOTE — Assessment & Plan Note (Deleted)
Discussed with patient prudent diet and moving more.  She agrees to go to the [facility] and exercise at least 3 days per week.   She will make a conscientious effort to drink more water daily.

## 2015-08-28 NOTE — Assessment & Plan Note (Signed)
Pt goes for screening Mammograms regularly

## 2015-08-30 DIAGNOSIS — H25013 Cortical age-related cataract, bilateral: Secondary | ICD-10-CM | POA: Diagnosis not present

## 2015-08-30 DIAGNOSIS — H2513 Age-related nuclear cataract, bilateral: Secondary | ICD-10-CM | POA: Diagnosis not present

## 2015-08-30 DIAGNOSIS — H43813 Vitreous degeneration, bilateral: Secondary | ICD-10-CM | POA: Diagnosis not present

## 2015-08-30 DIAGNOSIS — H04123 Dry eye syndrome of bilateral lacrimal glands: Secondary | ICD-10-CM | POA: Diagnosis not present

## 2015-09-13 DIAGNOSIS — R159 Full incontinence of feces: Secondary | ICD-10-CM | POA: Diagnosis not present

## 2015-09-13 DIAGNOSIS — K219 Gastro-esophageal reflux disease without esophagitis: Secondary | ICD-10-CM | POA: Diagnosis not present

## 2015-09-13 DIAGNOSIS — R69 Illness, unspecified: Secondary | ICD-10-CM | POA: Diagnosis not present

## 2015-09-13 DIAGNOSIS — E039 Hypothyroidism, unspecified: Secondary | ICD-10-CM | POA: Diagnosis not present

## 2015-09-13 DIAGNOSIS — R152 Fecal urgency: Secondary | ICD-10-CM | POA: Diagnosis not present

## 2015-09-13 DIAGNOSIS — E785 Hyperlipidemia, unspecified: Secondary | ICD-10-CM | POA: Diagnosis not present

## 2015-09-13 DIAGNOSIS — K921 Melena: Secondary | ICD-10-CM | POA: Diagnosis not present

## 2015-09-13 DIAGNOSIS — Z0000 Encounter for general adult medical examination without abnormal findings: Secondary | ICD-10-CM | POA: Diagnosis not present

## 2015-09-13 DIAGNOSIS — K591 Functional diarrhea: Secondary | ICD-10-CM | POA: Diagnosis not present

## 2015-09-13 DIAGNOSIS — R195 Other fecal abnormalities: Secondary | ICD-10-CM | POA: Diagnosis not present

## 2015-09-13 DIAGNOSIS — N179 Acute kidney failure, unspecified: Secondary | ICD-10-CM | POA: Diagnosis not present

## 2015-09-15 ENCOUNTER — Telehealth: Payer: Self-pay

## 2015-09-15 NOTE — Telephone Encounter (Signed)
Pt left v/m; pt having problems with her memory and pt wants referral to memory specialist. Pt request cb. Last annual exam 05/02/15. Pt has transferred to Dr Raliegh Scarlet at [facility]. I offered to transfer call for pt but she said she would call Dr Hershal Coria office herself.

## 2015-09-16 NOTE — Telephone Encounter (Signed)
please call Frances Hoffman and explained that I will need to have an office visit with her to discuss what her memory concerns are and have some documentation of that so I can determine who would be best to refer her to.   Pt informed and schedule for an appointment with Dr. Raliegh Scarlet.  [name], CMA

## 2015-09-21 ENCOUNTER — Ambulatory Visit (INDEPENDENT_AMBULATORY_CARE_PROVIDER_SITE_OTHER): Payer: Medicare HMO | Admitting: Family Medicine

## 2015-09-21 ENCOUNTER — Encounter: Payer: Self-pay | Admitting: Family Medicine

## 2015-09-21 VITALS — BP 100/64 | HR 87 | Resp 17 | Wt 167.0 lb

## 2015-09-21 DIAGNOSIS — E785 Hyperlipidemia, unspecified: Secondary | ICD-10-CM

## 2015-09-21 DIAGNOSIS — F4323 Adjustment disorder with mixed anxiety and depressed mood: Secondary | ICD-10-CM

## 2015-09-21 DIAGNOSIS — F329 Major depressive disorder, single episode, unspecified: Secondary | ICD-10-CM

## 2015-09-21 DIAGNOSIS — R5383 Other fatigue: Secondary | ICD-10-CM

## 2015-09-21 DIAGNOSIS — R413 Other amnesia: Secondary | ICD-10-CM | POA: Diagnosis not present

## 2015-09-21 DIAGNOSIS — E663 Overweight: Secondary | ICD-10-CM

## 2015-09-21 DIAGNOSIS — F32A Depression, unspecified: Secondary | ICD-10-CM

## 2015-09-21 DIAGNOSIS — E038 Other specified hypothyroidism: Secondary | ICD-10-CM

## 2015-09-21 DIAGNOSIS — I950 Idiopathic hypotension: Secondary | ICD-10-CM

## 2015-09-21 DIAGNOSIS — Z8249 Family history of ischemic heart disease and other diseases of the circulatory system: Secondary | ICD-10-CM
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
## 2015-09-21 NOTE — Patient Instructions (Addendum)
There is a significant link between depression, anxiety, and increasing stress levels with how well your brain functions and how well your memory is.    Try and achieve 7-8 hours of sleep at night.  Call your pastor to get on his scheduled for weekly or every 2 weeks counseling with him.  Increase your Lexapro from 20 mg a day to 30.   increase exercise to 30 minutes of pretty intense exercise most days of the week, 5 days or more.   Preferably going to the [facility] more for that socialization would help tremendously.  As we discussed take note of your sleep habits and whether or not your husband thinks you are snoring a lot or having periods where you'll hold your breath and then gag or gasp for air.  If this is the case, you will need a sleep study.  [email or web address]

## 2015-09-25 ENCOUNTER — Encounter: Payer: Self-pay | Admitting: Family Medicine

## 2015-09-25 DIAGNOSIS — I951 Orthostatic hypotension: Secondary | ICD-10-CM | POA: Insufficient documentation

## 2015-09-25 DIAGNOSIS — I959 Hypotension, unspecified: Secondary | ICD-10-CM | POA: Insufficient documentation

## 2015-09-27 DIAGNOSIS — R195 Other fecal abnormalities: Secondary | ICD-10-CM | POA: Diagnosis not present

## 2015-10-05 ENCOUNTER — Ambulatory Visit (INDEPENDENT_AMBULATORY_CARE_PROVIDER_SITE_OTHER): Payer: Medicare HMO | Admitting: Family Medicine

## 2015-10-05 ENCOUNTER — Encounter: Payer: Self-pay | Admitting: Family Medicine

## 2015-10-05 VITALS — BP 101/69 | HR 80 | Wt 169.0 lb

## 2015-10-05 DIAGNOSIS — Z853 Personal history of malignant neoplasm of breast: Secondary | ICD-10-CM

## 2015-10-05 DIAGNOSIS — R1032 Left lower quadrant pain: Secondary | ICD-10-CM

## 2015-10-05 NOTE — Progress Notes (Signed)
Impression and Recommendations:    1. Abdominal pain, left lower quadrant   2. NEOPLASM, MALIGNANT, BREAST, HX OF      Patient with acute on chronic left lower quadrant abdominal pain with onset after internal female exam many years ago. We will send to GYN for evaluation of her ovaries, uterus etc, especially seeing that she has a history of breast cancer.  We discussed that transvaginal ultrasound and evaluation with that may be the best next step prior to exposing her to large doses of radiation via abdominal CAT scan etc. Told her the GYN doctor will decide this.     She will follow-up with me for her other chronic issues and as needed for this pain. If her pain changes or worsens, she is to return to my clinic for further evaluation.  Patient's Medications  New Prescriptions   No medications on file  Previous Medications   B COMPLEX VITAMINS TABLET    Take 1 tablet by mouth daily.   CALCIUM CARBONATE (OS-CAL - DOSED IN MG OF ELEMENTAL CALCIUM) 1250 (500 CA) MG TABLET    Take 1 tablet by mouth daily.   CHOLECALCIFEROL (VITAMIN D3) 2000 UNITS CAPSULE    Take 2,000 Units by mouth daily.     DOCUSATE CALCIUM (SURFAK) 240 MG CAPSULE    Take 240 mg by mouth as needed for mild constipation.   ESCITALOPRAM (LEXAPRO) 20 MG TABLET    TAKE 1 TABLET BY MOUTH DAILY   LEVOTHYROXINE (SYNTHROID, LEVOTHROID) 75 MCG TABLET    Take 1 tablet (75 mcg total) by mouth daily.   MULTIPLE VITAMIN (MULTIVITAMIN) TABLET    Take 1 tablet by mouth daily.     OMEGA-3 FATTY ACIDS (FISH OIL PO)    Take 1 capsule by mouth daily.   PANTOPRAZOLE (PROTONIX) 40 MG TABLET    Take 1 tablet (40 mg total) by mouth 2 (two) times daily before a meal.   VITAMIN C (ASCORBIC ACID) 500 MG TABLET    Take 500 mg by mouth daily.  Modified Medications   No medications on file  Discontinued Medications   No medications on file    Return if symptoms worsen or fail to improve.  The patient was counseled, risk factors were  discussed, anticipatory guidance given.  Gross side effects, risk and benefits, and alternatives of medications discussed with patient.  Patient is aware that all medications have potential side effects and we are unable to predict every side effect or drug-drug interaction that may occur.  Expresses verbal understanding and consents to current therapy plan and treatment regimen.  Please see AVS handed out to patient at the end of our visit for further patient instructions/ counseling done pertaining to today's office visit.    Note: This document was prepared using Dragon voice recognition software and may include unintentional dictation errors.   --------------------------------------------------------------------------------------------------------------------------------------------------------------------------------------------------------------------------------------------    Subjective:    CC:  Chief Complaint  Patient presents with  . Abdominal Pain    HPI: Frances Hoffman is a 75 y.o. female who presents to [place] [facility] today for issues as discussed below.  Abd pain started bothering her again about one mo, but it is chronic and started many yrs ago.   L sided lower abd pain. "a Dull, little pain."   Not related to various activities or mvmnts or eating/ or empty stomach etc.   Feels hungry- no problems with eating/ food intake.   Lasts  for ? -> pt doesn';t know how long.   Maybe gets the pain couple times per week.   She's had this same pain for many yrs now.  Never mentioned it to doctors prior.  When pt did an internal exam several years ago at her GYN's office--> that's when pain started, but she never mentioned it.  Has uterus/ ovaries.  Absolutely no urinary sx, no vaginal sx at all. Patient states she has had a urinary tract infection in the past and yeast vaginitis and she has no symptoms like those Currently.   Diarhea is common for her- Sees Dr  Kalman Shan for this, doesn't know why, no constipation. NO f/c . No blood in stool.    Gyn doc- last went to few yrs ago but her doc passed away so she hasn't been to one recetnly. Dr Colin Ina- [place].    Pt has been going to the [facility].     Wt Readings from Last 3 Encounters:  10/05/15 169 lb (76.7 kg)  09/21/15 167 lb (75.8 kg)  08/24/15 166 lb 12.8 oz (75.7 kg)   BP Readings from Last 3 Encounters:  10/05/15 101/69  09/21/15 100/64  08/24/15 (!) 92/56   Pulse Readings from Last 3 Encounters:  10/05/15 80  09/21/15 87  08/24/15 75     Patient Active Problem List   Diagnosis Date Noted  . Depression 09/25/2015  . Asx Hypotension 09/25/2015  . Fecal incontinence 07/29/2015  . Heme positive stool 07/29/2015  . Leg pain 06/06/2015  . UTI (urinary tract infection) 06/06/2015  . Yeast vaginitis 05/11/2015  . Routine general medical examination at a health care facility 04/24/2015  . Overweight 03/15/2015  . Memory loss 09/22/2014  . Exercise intolerance 07/30/2014  . Estrogen deficiency 04/28/2014  . Tachycardia 04/03/2014  . Family history of heart disease 08/27/2013  . Dyspnea on exertion 08/18/2013  . Positional vertigo 08/18/2013  . Encounter for Medicare annual wellness exam 11/23/2011  . Other screening mammogram 11/23/2011  . Insomnia 01/16/2011  . Fatigue 08/15/2009  . Osteopenia 11/26/2008  . NEOPLASM, MALIGNANT, BREAST, HX OF 07/01/2008  . WEIGHT GAIN 01/15/2008  . Hypothyroidism 12/12/2007  . Hyperlipidemia 12/12/2007  . Adjustment disorder with mixed anxiety and depressed mood 12/12/2007  . GERD 12/12/2007  . ADHESIVE CAPSULITIS 12/12/2007    Past Medical history, Surgical history, Family history, Social history, Allergies and Medications have been entered into the medical record, reviewed and changed as needed.   Allergies:  Allergies  Allergen Reactions  . Ciprofloxacin Other (See Comments)    Unk  . Citalopram Hydrobromide      Intrusive/ weird thoughts   . Flagyl [Metronidazole]   . Oxycodone Other (See Comments)    Headaches    Review of Systems: No fever/ chills, night sweats, no unintended weight loss, No chest pain, or increased shortness of breath. No N/V/D.  Pertinent positives and negatives noted in HPI above    Objective:   Blood pressure 101/69, pulse 80, weight 169 lb (76.7 kg). Body mass index is 29.94 kg/m.  General: Well Developed, well nourished, appropriate for stated age.  Neuro: Alert and oriented x3, extra-ocular muscles intact, sensation grossly intact.  HEENT: Normocephalic, atraumatic, neck supple   Skin: Warm and dry, no gross rash. Cardiac: RRR, S1 S2,  no murmurs rubs or gallops.  Respiratory: ECTA B/L, Not using accessory muscles, speaking in full sentences-unlabored. Abd: Soft, bowel sounds 4 quadrants, nontender nondistended, no organomegaly, no guarding rigidity or rebound. GU:  deferred Vascular:  No gross lower ext edema, cap RF less 2 sec. Psych: No SI/HI, Insight and judgement good

## 2015-10-05 NOTE — Patient Instructions (Addendum)
Try to keep a pain journal. Write down when you get the pain and what you feel it may be related to.    if you don't hear from the [facility] office in one week, call my office.

## 2015-10-06 ENCOUNTER — Telehealth: Payer: Self-pay | Admitting: Obstetrics & Gynecology

## 2015-10-06 NOTE — Telephone Encounter (Signed)
Called and left a message for patient to call back to schedule a new patient doctor referral. °

## 2015-10-17 ENCOUNTER — Ambulatory Visit (INDEPENDENT_AMBULATORY_CARE_PROVIDER_SITE_OTHER): Payer: Medicare HMO | Admitting: Obstetrics & Gynecology

## 2015-10-17 ENCOUNTER — Encounter: Payer: Self-pay | Admitting: Obstetrics & Gynecology

## 2015-10-17 VITALS — BP 124/68 | HR 60 | Resp 14 | Ht 62.75 in | Wt 168.0 lb

## 2015-10-17 DIAGNOSIS — R1032 Left lower quadrant pain: Secondary | ICD-10-CM

## 2015-10-17 DIAGNOSIS — N952 Postmenopausal atrophic vaginitis: Secondary | ICD-10-CM

## 2015-10-17 DIAGNOSIS — Z124 Encounter for screening for malignant neoplasm of cervix: Secondary | ICD-10-CM

## 2015-10-17 NOTE — Progress Notes (Signed)
75 y.o. G2P2 MarriedCaucasianF here for new patient appt.  Referred from Dr. Raliegh Scarlet.  Pt has hx of RLQ pain that started after a visit with Dr. Sandi Mariscal several years ago.  Pt reports that she was "sore" after the visit but it just never really stopped.  Pt reports it's not sharp or stabbing, more dull and achy and does sometimes go but comes right back after a few days.  The pain never wakes her up at night.  It is not enough to require taking pain medication.  It's "just bothersome".    Pt reports she does have episodic diarrhea that occurs every 2-3 months.  Typically, this will last for a few hours and then resolve.  This is not associated with the RLQ pain.  She does not have constipation issues.  H/o negative colonoscopy 2010.  Denies vaginal bleeding or vaginal discharge.  Reports she did take premarin for a few years when she went into menopause but has been off of this for years.    She is not SA so cannot say if this makes the pain worse.  Denies urinary symptoms.  Review of records shows she's had a CT of the abdomen and pelvis 03/2014 showing benign hepatic lesions and some mildly prominent lyme noted that are retroperitoneal and in the root of the small bowel mesentery.  "Uterus and adnexal structures appear unremarkable".  Hepatic lesions and nodes are stable from prior CT.  That CT was done 10/29/12 and is also reviewed.  Liver lesion and mildly prominent lymph nodes (largest measuring 8.49mm) noted.  Also, pt had pelvic ultrasound obtained 03/13/10 showing non visualized ovaries (likely due to size) and normal uterus with thin endometrium.  Lastly, pt does have hx of shingles and has some chronic left sided pain.  Unsure if this is related.  No LMP recorded. Patient is postmenopausal.          Sexually active: No.  The current method of family planning is post menopausal status.    Exercising: Yes.    silver sneakers Smoker:  no   Health Maintenance: Pap:  Unsure, pt states years  ago  History of abnormal Pap:  no MMG:  03/04/2015 BIRADS 1 negative  Colonoscopy:  03/11/2008 repeat 10 years  BMD:   05/13/2014 osteopenia repeat 3 years per patient TDaP:  03/05/2006  Pneumonia vaccine(s):  04/28/2014  Zostavax:   04/05/2013  Hep C testing: not indicated Screening Labs: PCP, Hb today: PCP, Urine today: PCP   reports that she has never smoked. She has never used smokeless tobacco. She reports that she does not drink alcohol or use drugs.  PMedHx:  Reviewed and updated in chart  Past Surgical History:  Procedure Laterality Date  . BREAST SURGERY  2000   lumpectomy  . CHOLECYSTECTOMY N/A 02/21/2015   Procedure: LAPAROSCOPIC CHOLECYSTECTOMY WITH INTRAOPERATIVE CHOLANGIOGRAM;  Surgeon: Greer Pickerel, MD;  Location: [facility];  Service: General;  Laterality: N/A;  . COLONOSCOPY    . MULTIPLE TOOTH EXTRACTIONS      Current Outpatient Prescriptions  Medication Sig Dispense Refill  . b complex vitamins tablet Take 1 tablet by mouth daily.    . calcium carbonate (OS-CAL - DOSED IN MG OF ELEMENTAL CALCIUM) 1250 (500 CA) MG tablet Take 1 tablet by mouth daily.    . Cholecalciferol (VITAMIN D3) 2000 UNITS capsule Take 2,000 Units by mouth daily.      [name] docusate calcium (SURFAK) 240 MG capsule Take 240 mg by mouth as needed for  mild constipation.    [name] escitalopram (LEXAPRO) 20 MG tablet TAKE 1 TABLET BY MOUTH DAILY 30 tablet 11  . levothyroxine (SYNTHROID, LEVOTHROID) 75 MCG tablet Take 1 tablet (75 mcg total) by mouth daily. 30 tablet 11  . Multiple Vitamin (MULTIVITAMIN) tablet Take 1 tablet by mouth daily.      . Omega-3 Fatty Acids (FISH OIL PO) Take 1 capsule by mouth daily.    . pantoprazole (PROTONIX) 40 MG tablet Take 1 tablet (40 mg total) by mouth 2 (two) times daily before a meal. 60 tablet 1  . vitamin C (ASCORBIC ACID) 500 MG tablet Take 500 mg by mouth daily.     No current facility-administered medications for this visit.     Family History  Problem Relation Age of  Onset  . Osteoporosis Mother   . Parkinsonism Father   . Cancer Sister     breast CA  . Cancer Other     breast CA  . Coronary artery disease Sister   . Heart disease Maternal Grandmother   . Heart disease Maternal Grandfather     ROS:  Pertinent items are noted in HPI.  Otherwise, a comprehensive ROS was negative.  Exam:   Vitals:   10/17/15 1507  BP: 124/68  Pulse: 60  Resp: 14  Weight: 168 lb (76.2 kg)  Height: 5' 2.75" (1.594 m)    General appearance: alert, cooperative and appears stated age Head: Normocephalic, without obvious abnormality, atraumatic Lungs: clear to auscultation bilaterally Breasts: normal appearance, no masses or tenderness, well healed left breast scar with radiation changes, no LAD Heart: regular rate and rhythm Abdomen: soft, non-tender; bowel sounds normal; no masses,  no organomegaly.  Pt points to location of pain which is mid left abdomen, well above gyn structures Extremities: extremities normal, atraumatic, no cyanosis or edema Skin: Skin color, texture, turgor normal. No rashes or lesions Lymph nodes: Cervical, supraclavicular, and axillary nodes normal. No abnormal inguinal nodes palpated Neurologic: Grossly normal   Pelvic: External genitalia:  no lesions              Urethra:  normal appearing urethra with no masses, tenderness or lesions              Bartholins and Skenes: normal                 Vagina: normal appearing vagina with normal color and discharge, no lesions, atrophic changes              Cervix: no lesions              Pap taken: Yes.   Bimanual Exam:  Uterus:  normal size, contour, position, consistency, mobility, non-tender              Adnexa: normal adnexa and no mass, fullness, tenderness               Rectovaginal: Confirms               Anus:  normal sphincter tone, no lesions  Chaperone was present for exam.  A:  Chronic left, mid abdominal pain, long standing Normal imaging of pelvic with ultrasound 2012,  CT of abd/pelvis 2014 and 2016 showing stable liver lesion and mildly prominent lymphadenopathy of mesenteric root and retroperitoneal. PMP, no HRT use H/O left breast cancer, s/p lumpectomy with radiation therapy, 2000 Vaginal atrophic changes  P:   Given location of pain, mid left abdomen, and pelvic imaging done 2012, 2014, and  2016, as well as normal exam today, do not feel repeat PUS or CT is necessary.  Pt is without bleeding or other gyn complaints as well.  D/W pt, of course, could proceed with PUS for reassurance but she'd forgotten she'd had all the above testing done and didn't realize the CT scans included pelvic anatomy as well.  She is reassured by this. Reviewed Pap guidelines with pt but as she is here today and has not had one in many years, pap was obtained.

## 2015-10-21 ENCOUNTER — Encounter: Payer: Self-pay | Admitting: Obstetrics & Gynecology

## 2015-10-25 LAB — IPS PAP SMEAR ONLY

## 2015-10-25 NOTE — Progress Notes (Deleted)
Reviewed personally.  M. Suzanne Holden Draughon, MD.  

## 2015-10-26 ENCOUNTER — Encounter (HOSPITAL_COMMUNITY): Payer: Self-pay | Admitting: Internal Medicine

## 2015-10-26 ENCOUNTER — Ambulatory Visit (INDEPENDENT_AMBULATORY_CARE_PROVIDER_SITE_OTHER): Payer: Medicare HMO | Admitting: Family Medicine

## 2015-10-26 ENCOUNTER — Observation Stay (HOSPITAL_COMMUNITY)
Admission: EM | Admit: 2015-10-26 | Discharge: 2015-10-27 | Disposition: A | Discharge status: Home / Self Care | Payer: Medicare HMO | Attending: Internal Medicine | Admitting: Internal Medicine

## 2015-10-26 ENCOUNTER — Emergency Department (HOSPITAL_COMMUNITY): Payer: Medicare HMO

## 2015-10-26 ENCOUNTER — Encounter: Payer: Self-pay | Admitting: Family Medicine

## 2015-10-26 VITALS — BP 97/66 | HR 82 | Wt 168.2 lb

## 2015-10-26 DIAGNOSIS — R5383 Other fatigue: Secondary | ICD-10-CM

## 2015-10-26 DIAGNOSIS — R079 Chest pain, unspecified: Secondary | ICD-10-CM

## 2015-10-26 DIAGNOSIS — Z8249 Family history of ischemic heart disease and other diseases of the circulatory system: Secondary | ICD-10-CM | POA: Diagnosis not present

## 2015-10-26 DIAGNOSIS — Z87440 Personal history of urinary (tract) infections: Secondary | ICD-10-CM | POA: Diagnosis not present

## 2015-10-26 DIAGNOSIS — K219 Gastro-esophageal reflux disease without esophagitis: Secondary | ICD-10-CM | POA: Diagnosis present

## 2015-10-26 DIAGNOSIS — R61 Generalized hyperhidrosis: Secondary | ICD-10-CM

## 2015-10-26 DIAGNOSIS — Z7982 Long term (current) use of aspirin: Secondary | ICD-10-CM | POA: Diagnosis not present

## 2015-10-26 DIAGNOSIS — R0600 Dyspnea, unspecified: Secondary | ICD-10-CM | POA: Diagnosis present

## 2015-10-26 DIAGNOSIS — I2510 Atherosclerotic heart disease of native coronary artery without angina pectoris: Secondary | ICD-10-CM | POA: Insufficient documentation

## 2015-10-26 DIAGNOSIS — E785 Hyperlipidemia, unspecified: Secondary | ICD-10-CM | POA: Diagnosis not present

## 2015-10-26 DIAGNOSIS — R110 Nausea: Secondary | ICD-10-CM

## 2015-10-26 DIAGNOSIS — R9431 Abnormal electrocardiogram [ECG] [EKG]: Secondary | ICD-10-CM | POA: Diagnosis not present

## 2015-10-26 DIAGNOSIS — Z853 Personal history of malignant neoplasm of breast: Secondary | ICD-10-CM | POA: Insufficient documentation

## 2015-10-26 DIAGNOSIS — I2584 Coronary atherosclerosis due to calcified coronary lesion: Secondary | ICD-10-CM | POA: Insufficient documentation

## 2015-10-26 DIAGNOSIS — R0789 Other chest pain: Secondary | ICD-10-CM | POA: Diagnosis not present

## 2015-10-26 DIAGNOSIS — I9589 Other hypotension: Secondary | ICD-10-CM

## 2015-10-26 DIAGNOSIS — M8580 Other specified disorders of bone density and structure, unspecified site: Secondary | ICD-10-CM | POA: Diagnosis not present

## 2015-10-26 DIAGNOSIS — E039 Hypothyroidism, unspecified: Secondary | ICD-10-CM | POA: Diagnosis present

## 2015-10-26 DIAGNOSIS — Z87891 Personal history of nicotine dependence: Secondary | ICD-10-CM | POA: Insufficient documentation

## 2015-10-26 DIAGNOSIS — R0602 Shortness of breath: Secondary | ICD-10-CM | POA: Diagnosis not present

## 2015-10-26 LAB — TROPONIN I

## 2015-10-26 LAB — CBC
HCT: 44 % (ref 36.0–46.0)
HCT: 43.3 % (ref 36.0–46.0)
Hemoglobin: 14.4 g/dL (ref 12.0–15.0)
Hemoglobin: 14.4 g/dL (ref 12.0–15.0)
MCH: 31 pg (ref 26.0–34.0)
MCH: 31.4 pg (ref 26.0–34.0)
MCHC: 32.7 g/dL (ref 30.0–36.0)
MCHC: 33.3 g/dL (ref 30.0–36.0)
MCV: 94.8 fL (ref 78.0–100.0)
MCV: 94.5 fL (ref 78.0–100.0)
PLATELETS: 260 10*3/uL (ref 150–400)
PLATELETS: 260 10*3/uL (ref 150–400)
RBC: 4.64 MIL/uL (ref 3.87–5.11)
RBC: 4.58 MIL/uL (ref 3.87–5.11)
RDW: 13.7 % (ref 11.5–15.5)
RDW: 13.6 % (ref 11.5–15.5)
WBC: 7.1 10*3/uL (ref 4.0–10.5)
WBC: 8.3 10*3/uL (ref 4.0–10.5)

## 2015-10-26 LAB — CREATININE, SERUM
Creatinine, Ser: 1.06 mg/dL — ABNORMAL HIGH (ref 0.44–1.00)
GFR, EST AFRICAN AMERICAN: 58 mL/min — AB (ref >60)
GFR, EST NON AFRICAN AMERICAN: 50 mL/min — AB (ref >60)

## 2015-10-26 LAB — BASIC METABOLIC PANEL
Anion gap: 9 (ref 5–15)
BUN: 12 mg/dL (ref 6–20)
CHLORIDE: 104 mmol/L (ref 101–111)
CO2: 28 mmol/L (ref 22–32)
CREATININE: 1 mg/dL (ref 0.44–1.00)
Calcium: 9.7 mg/dL (ref 8.9–10.3)
GFR calc Af Amer: >60 mL/min (ref >60)
GFR calc non Af Amer: 54 mL/min — ABNORMAL LOW (ref >60)
GLUCOSE: 97 mg/dL (ref 65–99)
Potassium: 3.9 mmol/L (ref 3.5–5.1)
Sodium: 141 mmol/L (ref 135–145)

## 2015-10-26 LAB — I-STAT TROPONIN, ED
TROPONIN I, POC: 0 ng/mL (ref 0.00–0.08)
Troponin i, poc: 0 ng/mL (ref 0.00–0.08)

## 2015-10-26 LAB — D-DIMER, QUANTITATIVE: D-Dimer, Quant: 1 ug/mL-FEU — ABNORMAL HIGH (ref 0.00–0.50)

## 2015-10-26 LAB — EKG 12-LEAD

## 2015-10-26 MED ORDER — ACETAMINOPHEN 325 MG PO TABS
650.0000 mg | ORAL_TABLET | Freq: Four times a day (QID) | ORAL | Status: DC | PRN
  Administered 2015-10-26: 650 mg via ORAL
  Filled 2015-10-26: qty 2

## 2015-10-26 MED ORDER — NITROGLYCERIN 2 % TD OINT
1.0000 [in_us] | TOPICAL_OINTMENT | Freq: Once | TRANSDERMAL | Status: AC
  Administered 2015-10-26: 1 [in_us] via TOPICAL
  Filled 2015-10-26: qty 1

## 2015-10-26 MED ORDER — SODIUM CHLORIDE 0.9% FLUSH
3.0000 mL | Freq: Two times a day (BID) | INTRAVENOUS | Status: DC
  Administered 2015-10-26: 3 mL via INTRAVENOUS

## 2015-10-26 MED ORDER — VITAMIN C 500 MG PO TABS: 500.0000 mg | ORAL_TABLET | Freq: Every day | ORAL | Status: DC

## 2015-10-26 MED ORDER — KETOROLAC TROMETHAMINE 15 MG/ML IJ SOLN
15.0000 mg | Freq: Once | INTRAMUSCULAR | Status: AC
  Administered 2015-10-26: 15 mg via INTRAVENOUS
  Filled 2015-10-26: qty 1

## 2015-10-26 MED ORDER — ADULT MULTIVITAMIN W/MINERALS CH
1.0000 | ORAL_TABLET | Freq: Every day | ORAL | Status: DC
Start: 2015-10-27 — End: 2015-10-27
  Filled 2015-10-26: qty 1

## 2015-10-26 MED ORDER — SODIUM CHLORIDE 0.9 % IV BOLUS (SEPSIS)
500.0000 mL | Freq: Once | INTRAVENOUS | Status: AC
  Administered 2015-10-26: 500 mL via INTRAVENOUS

## 2015-10-26 MED ORDER — NITROGLYCERIN 2 % TD OINT
1.0000 [in_us] | TOPICAL_OINTMENT | Freq: Once | TRANSDERMAL | Status: DC
  Filled 2015-10-26: qty 30

## 2015-10-26 MED ORDER — ASPIRIN 325 MG PO TABS
325.0000 mg | ORAL_TABLET | Freq: Once | ORAL | Status: DC
  Administered 2015-10-26: 325 mg via ORAL

## 2015-10-26 MED ORDER — CALCIUM CARBONATE 1250 (500 CA) MG PO TABS
1.0000 | ORAL_TABLET | Freq: Every day | ORAL | Status: DC
  Administered 2015-10-26: 500 mg via ORAL
  Filled 2015-10-26: qty 1

## 2015-10-26 MED ORDER — ENOXAPARIN SODIUM 40 MG/0.4ML ~~LOC~~ SOLN
40.0000 mg | SUBCUTANEOUS | Status: DC
  Administered 2015-10-26: 40 mg via SUBCUTANEOUS
  Filled 2015-10-26: qty 0.4

## 2015-10-26 MED ORDER — IOPAMIDOL (ISOVUE-370) INJECTION 76%
INTRAVENOUS | Status: AC
Start: 2015-10-26 — End: 2015-10-26
  Administered 2015-10-26: 100 mL
  Filled 2015-10-26: qty 100

## 2015-10-26 MED ORDER — LEVOTHYROXINE SODIUM 75 MCG PO TABS
75.0000 ug | ORAL_TABLET | Freq: Every day | ORAL | Status: DC
  Administered 2015-10-27: 75 ug via ORAL
  Filled 2015-10-26: qty 1

## 2015-10-26 MED ORDER — DOCUSATE SODIUM 100 MG PO CAPS
100.0000 mg | ORAL_CAPSULE | Freq: Every day | ORAL | Status: DC
  Administered 2015-10-26: 100 mg via ORAL
  Filled 2015-10-26: qty 1

## 2015-10-26 MED ORDER — NITROGLYCERIN 0.3 MG SL SUBL
0.4000 mg | SUBLINGUAL_TABLET | SUBLINGUAL | Status: DC | PRN
  Administered 2015-10-26: 0.3 mg via SUBLINGUAL

## 2015-10-26 MED ORDER — ONDANSETRON HCL 4 MG/2ML IJ SOLN: 4.0000 mg | Freq: Four times a day (QID) | INTRAMUSCULAR | Status: DC | PRN

## 2015-10-26 MED ORDER — PANTOPRAZOLE SODIUM 40 MG PO TBEC
40.0000 mg | DELAYED_RELEASE_TABLET | Freq: Every day | ORAL | Status: DC
  Administered 2015-10-26: 40 mg via ORAL
  Filled 2015-10-26: qty 1

## 2015-10-26 MED ORDER — ALPRAZOLAM 0.25 MG PO TABS
0.2500 mg | ORAL_TABLET | Freq: Two times a day (BID) | ORAL | Status: DC | PRN
  Administered 2015-10-26: 0.25 mg via ORAL
  Filled 2015-10-26: qty 1

## 2015-10-26 NOTE — ED Provider Notes (Signed)
[name] DEPT Provider Note   CSN: [id number] Arrival date & time: 10/26/15  1509     History   Chief Complaint Chief Complaint  Patient presents with  . Chest Pain    HPI Frances Hoffman is a 75 y.o. female.  HPI   Pt with HLD p/w 5 days of gradually worsening chest tightness and pressure with SOB, significant sweats.  States she was out of town in [place], [place], 6 hours drive from here, walking around and doing a few hikes, when she developed this pain.  The symptoms have gotten progressively worse and she was seen by her doctor today, was given aspirin and nitroglycerin and sent to ED.  States she feels complete relief after the nitroglycerin.  Previously had not noticed any exacerbating or palliative symptoms.  Denies and recent fevers, cough, abdominal pain, leg swelling, orthopnea.  May have had a stress test remotely, she is unsure of year.       Past Medical History:  Diagnosis Date  . Adjustment disorder with mixed anxiety and depressed mood 12/12/2007   Qualifier: Diagnosis of  By: Copland MD, Frederico Hamman    . Cancer ([place]) 2000   breast had lumpectomy/radiation x36,mammo ,no chemo  . Cataract of both eyes   . Depression   . Dysuria-frequency syndrome    takes AZO  . Frozen shoulder   . Gall stones   . GERD 12/12/2007   Qualifier: Diagnosis of  By: Copland MD, Frederico Hamman    . Hyperlipidemia   . Hypothyroidism 12/12/2007   Qualifier: Diagnosis of  By: Copland MD, Frederico Hamman    . Osteopenia    BMD 2004, WNL 2008  . Pneumonia   . Recurrent UTI   . Shingles    chronic body pain, left side of body  . Shortness of breath dyspnea    when climbing stairs only  . Wears glasses     Patient Active Problem List   Diagnosis Date Noted  . Depression 09/25/2015  . Asx Hypotension 09/25/2015  . Fecal incontinence 07/29/2015  . Heme positive stool 07/29/2015  . Leg pain 06/06/2015  . Routine general medical examination at a health care facility 04/24/2015  . Overweight  03/15/2015  . Memory loss 09/22/2014  . Exercise intolerance 07/30/2014  . Estrogen deficiency 04/28/2014  . Tachycardia 04/03/2014  . Family history of heart disease 08/27/2013  . Dyspnea on exertion 08/18/2013  . Positional vertigo 08/18/2013  . Encounter for Medicare annual wellness exam 11/23/2011  . Other screening mammogram 11/23/2011  . Insomnia 01/16/2011  . Fatigue 08/15/2009  . Osteopenia 11/26/2008  . NEOPLASM, MALIGNANT, BREAST, HX OF 07/01/2008  . WEIGHT GAIN 01/15/2008  . Hypothyroidism 12/12/2007  . Hyperlipidemia 12/12/2007  . Adjustment disorder with mixed anxiety and depressed mood 12/12/2007  . GERD 12/12/2007  . ADHESIVE CAPSULITIS 12/12/2007    Past Surgical History:  Procedure Laterality Date  . BREAST SURGERY  2000   lumpectomy  . CHOLECYSTECTOMY N/A 02/21/2015   Procedure: LAPAROSCOPIC CHOLECYSTECTOMY WITH INTRAOPERATIVE CHOLANGIOGRAM;  Surgeon: Greer Pickerel, MD;  Location: [facility];  Service: General;  Laterality: N/A;  . COLONOSCOPY    . MULTIPLE TOOTH EXTRACTIONS      OB History    Gravida Para Term Preterm AB Living   2 2 2          SAB TAB Ectopic Multiple Live Births                   Home Medications  Prior to Admission medications   Medication Sig Start Date End Date Taking? Authorizing Provider  b complex vitamins tablet Take 1 tablet by mouth daily.    Historical Provider, MD  calcium carbonate (OS-CAL - DOSED IN MG OF ELEMENTAL CALCIUM) 1250 (500 CA) MG tablet Take 1 tablet by mouth daily.    Historical Provider, MD  Cholecalciferol (VITAMIN D3) 2000 UNITS capsule Take 2,000 Units by mouth daily.      Historical Provider, MD  docusate calcium (SURFAK) 240 MG capsule Take 240 mg by mouth as needed for mild constipation.    Historical Provider, MD  levothyroxine (SYNTHROID, LEVOTHROID) 75 MCG tablet Take 1 tablet (75 mcg total) by mouth daily. 05/02/15   Abner Greenspan, MD  Multiple Vitamin (MULTIVITAMIN) tablet Take 1 tablet by mouth  daily.      Historical Provider, MD  Omega-3 Fatty Acids (FISH OIL PO) Take 1 capsule by mouth daily.    Historical Provider, MD  omeprazole (PRILOSEC) 20 MG capsule Take 20 mg by mouth daily.    Historical Provider, MD  vitamin C (ASCORBIC ACID) 500 MG tablet Take 500 mg by mouth daily.    Historical Provider, MD    Family History Family History  Problem Relation Age of Onset  . Osteoporosis Mother   . Parkinsonism Father   . Cancer Sister     breast CA  . Cancer Other     breast CA  . Coronary artery disease Sister   . Heart disease Maternal Grandmother   . Heart disease Maternal Grandfather     Social History Social History  Substance Use Topics  . Smoking status: Never Smoker  . Smokeless tobacco: Never Used  . Alcohol use No     Allergies   Ciprofloxacin; Citalopram hydrobromide; Flagyl [metronidazole]; and Oxycodone   Review of Systems Review of Systems  All other systems reviewed and are negative.    Physical Exam Updated Vital Signs BP 106/71 (BP Location: Left Arm)   Pulse 73   Temp 98.3 F (36.8 C) (Oral)   Resp 16   Ht 5\' 4"  (1.626 m)   Wt 76.2 kg   SpO2 97%   BMI 28.84 kg/m   Physical Exam  Constitutional: She appears well-developed and well-nourished. No distress.  HENT:  Head: Normocephalic and atraumatic.  Neck: Neck supple.  Cardiovascular: Normal rate and regular rhythm.   Pulmonary/Chest: Effort normal and breath sounds normal. No respiratory distress. She has no wheezes. She has no rales.  Abdominal: Soft. She exhibits no distension. There is no tenderness. There is no rebound and no guarding.  Musculoskeletal: She exhibits no edema.  Neurological: She is alert.  Skin: She is not diaphoretic.  Nursing note and vitals reviewed.    ED Treatments / Results  Labs (all labs ordered are listed, but only abnormal results are displayed) Labs Reviewed  BASIC METABOLIC PANEL - Abnormal; Notable for the following:       Result Value    GFR calc non Af Amer 54 (*)    All other components within normal limits  D-DIMER, QUANTITATIVE (NOT AT [facility]) - Abnormal; Notable for the following:    D-Dimer, Quant 1.00 (*)    All other components within normal limits  CBC  I-STAT TROPOININ, ED  Randolm Idol, ED    EKG  EKG Interpretation  Date/Time:  Wednesday October 26 2015 15:26:51 EDT Ventricular Rate:  70 PR Interval:    QRS Duration: 75 QT Interval:  407 QTC Calculation:  440 R Axis:   20 Text Interpretation:  Sinus rhythm Low voltage, precordial leads Nonspecific T abnormalities, anterior leads Compared to previous tracing TW changes less evidence in V2-V3 Confirmed by ISAACS MD, CAMERON [phone]) on 10/26/2015 4:06:47 PM       Radiology Dg Chest 2 View  Result Date: 10/26/2015 CLINICAL DATA:  Three days of shortness of breath; history of breast malignancy, hyperlipidemia EXAM: CHEST  2 VIEW COMPARISON:  PA and lateral chest x-ray of April 04, 2014 FINDINGS: The lungs are adequately inflated and clear. The heart and pulmonary vascularity are normal. There is no pleural effusion or pneumothorax. There is calcification in the wall of the thoracic aorta. The bony thorax exhibits no acute abnormality. IMPRESSION: There is no pneumonia, CHF, evidence of metastatic disease, nor other acute cardiopulmonary abnormality. Aortic atherosclerosis. Electronically Signed   By: David  Martinique M.D.   On: 10/26/2015 16:52   Ct Angio Chest Pe W And/or Wo Contrast  Result Date: 10/26/2015 CLINICAL DATA:  Chest pain. EXAM: CT ANGIOGRAPHY CHEST WITH CONTRAST TECHNIQUE: Multidetector CT imaging of the chest was performed using the standard protocol during bolus administration of intravenous contrast. Multiplanar CT image reconstructions and MIPs were obtained to evaluate the vascular anatomy. CONTRAST:  100 mL Isovue 370 IV COMPARISON:  Chest x-ray earlier today. FINDINGS: Cardiovascular: The pulmonary arteries are well opacified. There is no  evidence of pulmonary embolism. The thoracic aorta is normal in caliber without evidence of aneurysmal disease. The heart size is normal. No pericardial fluid identified. Coronary atherosclerosis present with calcified plaque in the distribution of the LAD and also likely the right coronary artery. Mediastinum/Nodes: No evidence of mediastinal masses. No mediastinal or hilar lymphadenopathy identified. Lungs/Pleura: Lungs show scattered scarring and atelectasis without evidence of airspace consolidation, edema, nodule or pneumothorax. No pleural fluid identified. Upper Abdomen: Visualized upper abdomen shows low density structure in the superior liver measuring approximately 1.9 cm in greatest diameter and likely representing a cyst. Internal density is consistent with a low density cyst. The gallbladder has been removed. Musculoskeletal: No bony abnormalities identified. Review of the MIP images confirms the above findings. IMPRESSION: 1. No evidence of pulmonary embolism. 2. Coronary atherosclerosis with calcified plaque in the distribution of the LAD and RCA. 3. Low-density cyst within the dome of the liver which appears benign and measures approximately 1.9 cm in greatest diameter. Electronically Signed   By: Aletta Edouard M.D.   On: 10/26/2015 18:59    Procedures Procedures (including critical care time)  Medications Ordered in ED Medications  nitroGLYCERIN (NITROGLYN) 2 % ointment 1 inch (1 inch Topical Given 10/26/15 1806)  sodium chloride 0.9 % bolus 500 mL (500 mLs Intravenous New Bag/Given 10/26/15 1806)  iopamidol (ISOVUE-370) 76 % injection (100 mLs  Contrast Given 10/26/15 1828)     Initial Impression / Assessment and Plan / ED Course  I have reviewed the triage vital signs and the nursing notes.  Pertinent labs & imaging results that were available during my care of the patient were reviewed by me and considered in my medical decision making (see chart for details).  Clinical Course    Value Comment By Time  D-Dimer, Quant: (!) 1.00 (Reviewed) Clayton Bibles, PA-C 08/23 1709    Afebrile nontoxic patient with gradually increasing chest pressure and SOB x 5 days, relieved with nitro prior to arrival.  Initial troponin negative.  Pt discussed with and also seen by Dr Ellender Hose.  Started having SOB again while in ED, nitro paste  placed.  IVF started to maintain blood pressure.  CT angio chest was negative for PE but demonstrates calcified plaque in the LAD and RCA.  Admitted to [facility], Dr Olevia Bowens accepting.    Final Clinical Impressions(s) / ED Diagnoses   Final diagnoses:  Chest pain, unspecified chest pain type  Dyspnea    New Prescriptions New Prescriptions   No medications on file     Clayton Bibles, PA-C 10/26/15 1936    Duffy Bruce, MD 10/27/15 [phone]

## 2015-10-26 NOTE — Progress Notes (Signed)
Impression and Recommendations:    1. Nonspecific chest pain   2. Other chest pain   3. Abnormal EKG   4. Other fatigue   5. Diaphoresis   6. Nausea without vomiting   7. Other specified hypotension      Immediately after being shown EKG and my appreciation of nonspecific ST changes and T-wave inversion in V2 through along with patient's current history and vitals   -EMS was summoned.   I immediately gave my medical assistant instructions to administer 2 L O2, 325 aspirin and nitroglycerin sublingual.   Within a couple minutes, patient's chest heaviness had gone completely away, she denies headache.  Discussion with patient regarding positives and negatives of going to the hospital for rule out of cardiopulmonary issues was had.   Patient was very resistant to go to the emergency room but we insisted unless she wanted to sign AMA papers, we were going to proceed with our current plan.    Patient was tearful in the office and asked if I would call her husband at home and leave him a message which I did.    I stayed with patient until EMS arrived and transferred care to them.    Patient's Medications  New Prescriptions   No medications on file  Previous Medications   B COMPLEX VITAMINS TABLET    Take 1 tablet by mouth daily.   CALCIUM CARBONATE (OS-CAL - DOSED IN MG OF ELEMENTAL CALCIUM) 1250 (500 CA) MG TABLET    Take 1 tablet by mouth daily.   CHOLECALCIFEROL (VITAMIN D3) 2000 UNITS CAPSULE    Take 2,000 Units by mouth daily.     DOCUSATE CALCIUM (SURFAK) 240 MG CAPSULE    Take 240 mg by mouth as needed for mild constipation.   LEVOTHYROXINE (SYNTHROID, LEVOTHROID) 75 MCG TABLET    Take 1 tablet (75 mcg total) by mouth daily.   MULTIPLE VITAMIN (MULTIVITAMIN) TABLET    Take 1 tablet by mouth daily.     OMEGA-3 FATTY ACIDS (FISH OIL PO)    Take 1 capsule by mouth daily.   OMEPRAZOLE (PRILOSEC) 20 MG CAPSULE    Take 20 mg by mouth daily.   VITAMIN C (ASCORBIC  ACID) 500 MG TABLET    Take 500 mg by mouth daily.  Modified Medications   No medications on file  Discontinued Medications   PANTOPRAZOLE (PROTONIX) 40 MG TABLET    Take 1 tablet (40 mg total) by mouth 2 (two) times daily before a meal.    Return for Total patient to return once she was discharged from the emergency room\ hospital.  The patient was counseled, risk factors were discussed, anticipatory guidance given.  Gross side effects, risk and benefits, and alternatives of medications discussed with patient.  Patient is aware that all medications have potential side effects and we are unable to predict every side effect or drug-drug interaction that may occur.  Expresses verbal understanding and consents to current therapy plan and treatment regimen.  Please see AVS handed out to patient at the end of our visit for further patient instructions/ counseling done pertaining to today's office visit.    Note: This document was prepared using Dragon voice recognition software and may include unintentional dictation errors.   --------------------------------------------------------------------------------------------------------------------------------------------------------------------------------------------------------------------------------------------    Subjective:    CC:  Chief Complaint  Patient presents with  . Chest Pain  . Fatigue    HPI: Frances Hoffman is a  75 y.o. female who presents to [facility] at [facility] today for issues as discussed below.   Patient came in for acute care office visit today.  I was pulled out of a room with another patient and was presented with EKG in the mid 70s which showed T-wave inversion in V2-V4.  And was told patient was having active chest pain.  Upon questioning patient was recently in the [place] and had onset of chest pain with exertion and shortness of breath during her walks up and down  hills of [place].  She said she felt much more tired than usual.   It has been getting progressively worse since then. Today she complains of "heaviness in her chest " which radiates to her left shoulder at times, nausea, diaphoresis, feeling short of breath and much more fatigued than usual with activity.  She denies that he she has "chest pain ", but rather has a heaviness on her chest, and is sweaty in appearance    Wt Readings from Last 3 Encounters:  10/26/15 168 lb (76.2 kg)  10/26/15 168 lb 3.2 oz (76.3 kg)  10/17/15 168 lb (76.2 kg)   BP Readings from Last 3 Encounters:  10/26/15 134/77  10/26/15 97/66  10/17/15 124/68   Pulse Readings from Last 3 Encounters:  10/26/15 77  10/26/15 82  10/17/15 60     Patient Active Problem List   Diagnosis Date Noted  . Chest pain 10/26/2015  . Depression 09/25/2015  . Asx Hypotension 09/25/2015  . Fecal incontinence 07/29/2015  . Heme positive stool 07/29/2015  . Leg pain 06/06/2015  . Routine general medical examination at a health care facility 04/24/2015  . Overweight 03/15/2015  . Memory loss 09/22/2014  . Exercise intolerance 07/30/2014  . Estrogen deficiency 04/28/2014  . Tachycardia 04/03/2014  . Family history of heart disease 08/27/2013  . Dyspnea on exertion 08/18/2013  . Positional vertigo 08/18/2013  . Encounter for Medicare annual wellness exam 11/23/2011  . Other screening mammogram 11/23/2011  . Insomnia 01/16/2011  . Fatigue 08/15/2009  . Osteopenia 11/26/2008  . NEOPLASM, MALIGNANT, BREAST, HX OF 07/01/2008  . WEIGHT GAIN 01/15/2008  . Hypothyroidism 12/12/2007  . Hyperlipidemia 12/12/2007  . Adjustment disorder with mixed anxiety and depressed mood 12/12/2007  . GERD 12/12/2007  . ADHESIVE CAPSULITIS 12/12/2007    Past Medical history, Surgical history, Family history, Social history, Allergies and Medications have been entered into the medical record, reviewed and changed as needed.   Allergies:    Allergies  Allergen Reactions  . Ciprofloxacin Other (See Comments)    Unk  . Citalopram Hydrobromide     Intrusive/ weird thoughts   . Flagyl [Metronidazole]   . Oxycodone Other (See Comments)    Headaches    Review of Systems: No fever/ chills, night sweats, no unintended weight loss, No chest pain, or increased shortness of breath. No N/V/D.  Pertinent positives and negatives noted in HPI above    Objective:   Blood pressure 97/66, pulse 82, weight 168 lb 3.2 oz (76.3 kg). Body mass index is 30.03 kg/m.  General: Well Developed, well nourished, appropriate for stated age.  Neuro: Alert and oriented x3, extra-ocular muscles intact, sensation grossly intact.  HEENT: Normocephalic, atraumatic, neck supple   Skin: Pale, diaphoretic, no gross rash. Cardiac: RRR, S1 S2,  no murmurs rubs or gallops.  Respiratory: ECTA B/L, Not using accessory muscles, speaking in full sentences-unlabored. Vascular:  No gross lower ext edema, cap  RF less 2 sec. Psych: No SI/HI, Insight and judgement good

## 2015-10-26 NOTE — ED Triage Notes (Signed)
Chest pressure that began Saturday.  Pain has been intermittent until today.  Went to PCP where she received 324 asa and 2 ntg.  EMS called and pt transported to [facility] ED.

## 2015-10-26 NOTE — ED Notes (Signed)
Patient transported to CT 

## 2015-10-26 NOTE — H&P (Signed)
History and Physical    Frances Hoffman ZOX:[id number] DOB: 10-26-1940 DOA: 10/26/2015  PCP: [name], DO   Patient coming from: PCPs office.  Chief Complaint: Dyspnea.  HPI: Frances Hoffman is a 75 y.o. female with medical history significant of anxiety, depression, cataracts, dysuria frequency syndrome, breast cancer, GERD, hyperlipidemia, hypothyroidism, osteopenia, recurrent UTIs, herpes zoster, who comes to the emergency department due to increasingly frequent and progressively worse dyspnea for the past 2 weeks.  Per patient, she has been having dyspnea while at rest or while asserting for the past 2 weeks, but denies chest pain. She is states that she noticed that her symptoms were getting worse while she was hiking in [place]. However, she has had pressure-like sensation, which she does not describe as pain, during these episodes associated with diaphoresis. She denies productive cough, dizziness, palpitations, PND, orthopnea or pitting edema of the lower extremities. She has a history of hyperlipidemia, positive family history of CAD, denies cigarette smoking, alcohol or drug use.  She subsequently went to see her doctor today. She was given aspirin, sublingual nitroglycerin and referred to the emergency department. She reports resolution of the symptoms after nitroglycerin. She is currently on nitro paste in the emergency department.  ED Course: Workup shows negative troponin, no acute cardiopulmonary pathology on chest radiograph and CT scan angiogram shows no PE, but significant coronary arteries calcification.   Review of Systems: As per HPI otherwise 10 point review of systems negative.    Past Medical History:  Diagnosis Date  . Adjustment disorder with mixed anxiety and depressed mood 12/12/2007   Qualifier: Diagnosis of  By: Copland MD, Karleen Hampshire    . Cancer (HCC) 2000   breast had lumpectomy/radiation x36,mammo ,no chemo  . Cataract of both eyes   .  Depression   . Dysuria-frequency syndrome    takes AZO  . Frozen shoulder   . Gall stones   . GERD 12/12/2007   Qualifier: Diagnosis of  By: Copland MD, Karleen Hampshire    . Hyperlipidemia   . Hypothyroidism 12/12/2007   Qualifier: Diagnosis of  By: Copland MD, Karleen Hampshire    . Osteopenia    BMD 2004, WNL 2008  . Pneumonia   . Recurrent UTI   . Shingles    chronic body pain, left side of body  . Shortness of breath dyspnea    when climbing stairs only  . Wears glasses     Past Surgical History:  Procedure Laterality Date  . BREAST LUMPECTOMY Left 2000  . CHOLECYSTECTOMY N/A 02/21/2015   Procedure: LAPAROSCOPIC CHOLECYSTECTOMY WITH INTRAOPERATIVE CHOLANGIOGRAM;  Surgeon: Gaynelle Adu, MD;  Location: [facility] OR;  Service: General;  Laterality: N/A;  . COLONOSCOPY    . MULTIPLE TOOTH EXTRACTIONS       reports that she has never smoked. She has never used smokeless tobacco. She reports that she does not drink alcohol or use drugs.  Allergies  Allergen Reactions  . Ciprofloxacin Other (See Comments)    Unknown (vertigo??)  . Citalopram Hydrobromide Other (See Comments)    Intrusive/ odd thoughts   . Flagyl [Metronidazole] Other (See Comments)    Unknown??  . Oxycodone Other (See Comments)    Headaches    Family History  Problem Relation Age of Onset  . Osteoporosis Mother   . Parkinsonism Father   . Cancer Sister     breast CA  . Cancer Other     breast CA  . Coronary artery disease Sister   .  Heart disease Maternal Grandmother   . Heart disease Maternal Grandfather     Prior to Admission medications   Medication Sig Start Date End Date Taking? Authorizing Provider  aspirin-sod bicarb-citric acid (ALKA-SELTZER) 325 MG TBEF tablet Take 325 mg by mouth 2 (two) times daily as needed (for shortness of breath/bloated feeling).    Yes Historical Provider, MD  b complex vitamins tablet Take 1 tablet by mouth daily.   Yes Historical Provider, MD  calcium carbonate (OS-CAL - DOSED IN MG OF  ELEMENTAL CALCIUM) 1250 (500 CA) MG tablet Take 1 tablet by mouth daily.   Yes Historical Provider, MD  carboxymethylcellulose (REFRESH PLUS) 0.5 % SOLN Place 1 drop into both eyes 3 (three) times daily as needed (for dryness).   Yes Historical Provider, MD  Cholecalciferol (VITAMIN D3) 2000 UNITS capsule Take 2,000 Units by mouth daily.     Yes Historical Provider, MD  levothyroxine (SYNTHROID, LEVOTHROID) 75 MCG tablet Take 1 tablet (75 mcg total) by mouth daily. 05/02/15  Yes [name], MD  Multiple Vitamin (MULTIVITAMIN) tablet Take 1 tablet by mouth daily.     Yes Historical Provider, MD  Omega-3 Fatty Acids (FISH OIL PO) Take 1 capsule by mouth daily.   Yes Historical Provider, MD  omeprazole (PRILOSEC OTC) 20 MG tablet Take 20 mg by mouth at bedtime.   Yes Historical Provider, MD  vitamin C (ASCORBIC ACID) 500 MG tablet Take 500 mg by mouth daily.   Yes Historical Provider, MD    Physical Exam: Vitals:   10/26/15 1630 10/26/15 1815 10/26/15 2051 10/26/15 2200  BP: 113/66 134/77 112/68 107/61  Pulse: 75 77  77  Resp: 10 (!) 9 11 18   Temp:    98 F (36.7 C)  TempSrc:    Oral  SpO2: 96% 96%  94%  Weight:    77 kg (169 lb 12.8 oz)  Height:    5\' 4"  (1.626 m)      Constitutional: NAD, calm, comfortable Vitals:   10/26/15 1630 10/26/15 1815 10/26/15 2051 10/26/15 2200  BP: 113/66 134/77 112/68 107/61  Pulse: 75 77  77  Resp: 10 (!) 9 11 18   Temp:    98 F (36.7 C)  TempSrc:    Oral  SpO2: 96% 96%  94%  Weight:    77 kg (169 lb 12.8 oz)  Height:    5\' 4"  (1.626 m)   Eyes: PERRL, lids and conjunctivae normal ENMT: Mucous membranes are moist. Posterior pharynx clear of any exudate or lesions. Neck: normal, supple, no masses, no thyromegaly Respiratory: clear to auscultation bilaterally, no wheezing, no crackles. Normal respiratory effort. No accessory muscle use.  Cardiovascular: Regular rate and rhythm, no murmurs / rubs / gallops. No extremity edema. 2+ pedal pulses. No  carotid bruits.  Abdomen: no tenderness, no masses palpated. No hepatosplenomegaly. Bowel sounds positive.  Musculoskeletal: no clubbing / cyanosis. No joint deformity upper and lower extremities. Good ROM, no contractures. Normal muscle tone.  Skin: no rashes, lesions, ulcers. No induration Neurologic: CN 2-12 grossly intact. Sensation intact, DTR normal. Strength 5/5 in all 4.  Psychiatric: Normal judgment and insight. Alert and oriented x 4. Normal mood.     Labs on Admission: I have personally reviewed following labs and imaging studies  CBC:  Recent Labs Lab 10/26/15 1558 10/26/15 2148  WBC 7.1 8.3  HGB 14.4 14.4  HCT 44.0 43.3  MCV 94.8 94.5  PLT 260 260   Basic Metabolic Panel:  Recent Labs  Lab 10/26/15 1558  NA 141  K 3.9  CL 104  CO2 28  GLUCOSE 97  BUN 12  CREATININE 1.00  CALCIUM 9.7   GFR: Estimated Creatinine Clearance: 48.8 mL/min (by C-G formula based on SCr of 1 mg/dL).  Urine analysis:    Component Value Date/Time   COLORURINE AMBER (A) 04/03/2014 1440   APPEARANCEUR CLOUDY (A) 04/03/2014 1440   LABSPEC 1.017 04/03/2014 1440   PHURINE 7.0 04/03/2014 1440   GLUCOSEU NEGATIVE 04/03/2014 1440   HGBUR LARGE (A) 04/03/2014 1440   HGBUR negative 10/18/2008 1223   BILIRUBINUR negative 06/25/2015 1310   KETONESUR NEGATIVE 04/03/2014 1440   PROTEINUR negative 06/25/2015 1310   PROTEINUR 100 (A) 04/03/2014 1440   UROBILINOGEN 1.0 06/25/2015 1310   UROBILINOGEN 0.2 04/03/2014 1440   NITRITE POSITIVE 06/25/2015 1310   NITRITE POSITIVE (A) 04/03/2014 1440   LEUKOCYTESUR Negative 06/25/2015 1310     Radiological Exams on Admission: Dg Chest 2 View  Result Date: 10/26/2015 CLINICAL DATA:  Three days of shortness of breath; history of breast malignancy, hyperlipidemia EXAM: CHEST  2 VIEW COMPARISON:  PA and lateral chest x-ray of April 04, 2014 FINDINGS: The lungs are adequately inflated and clear. The heart and pulmonary vascularity are normal.  There is no pleural effusion or pneumothorax. There is calcification in the wall of the thoracic aorta. The bony thorax exhibits no acute abnormality. IMPRESSION: There is no pneumonia, CHF, evidence of metastatic disease, nor other acute cardiopulmonary abnormality. Aortic atherosclerosis. Electronically Signed   By: David  [place]az[place]and M.D.   On: 10/26/2015 16:52   Ct Angio Chest Pe W And/or Wo Contrast  Result Date: 10/26/2015 CLINICAL DATA:  Chest pain. EXAM: CT ANGIOGRAPHY CHEST WITH CONTRAST TECHNIQUE: Multidetector CT imaging of the chest was performed using the standard protocol during bolus administration of intravenous contrast. Multiplanar CT image reconstructions and MIPs were obtained to evaluate the vascular anatomy. CONTRAST:  100 mL Isovue 370 IV COMPARISON:  Chest x-ray earlier today. FINDINGS: Cardiovascular: The pulmonary arteries are well opacified. There is no evidence of pulmonary embolism. The thoracic aorta is normal in caliber without evidence of aneurysmal disease. The heart size is normal. No pericardial fluid identified. Coronary atherosclerosis present with calcified plaque in the distribution of the LAD and also likely the right coronary artery. Mediastinum/Nodes: No evidence of mediastinal masses. No mediastinal or hilar lymphadenopathy identified. Lungs/Pleura: Lungs show scattered scarring and atelectasis without evidence of airspace consolidation, edema, nodule or pneumothorax. No pleural fluid identified. Upper Abdomen: Visualized upper abdomen shows low density structure in the superior liver measuring approximately 1.9 cm in greatest diameter and likely representing a cyst. Internal density is consistent with a low density cyst. The gallbladder has been removed. Musculoskeletal: No bony abnormalities identified. Review of the MIP images confirms the above findings. IMPRESSION: 1. No evidence of pulmonary embolism. 2. Coronary atherosclerosis with calcified plaque in the  distribution of the LAD and RCA. 3. Low-density cyst within the dome of the liver which appears benign and measures approximately 1.9 cm in greatest diameter. Electronically Signed   By: Irish Lack M.D.   On: 10/26/2015 18:59    EKG: Independently reviewed. Vent. rate 70 BPM PR interval * ms QRS duration 75 ms QT/QTc 407/440 ms P-R-T axes 42 20 41 Sinus rhythm Low voltage, precordial leads Nonspecific T abnormalities, anterior leads  Assessment/Plan Principal Problem:   Chest pain Admit to telemetry/observation. Continue supplemental oxygen. Continue Nitropaste. Trend troponin levels. Repeat EKG in the morning. Check echocardiogram  in the morning. Reconsult cardiology in the morning.  Active Problems:   Hypothyroidism Continue levothyroxine 75 g by mouth daily. Monitor TSH periodically.    Hyperlipidemia Patient is currently taking omega-3 fatty acids. Check fasting lipids in a.m.    GERD Pantoprazole 40 mg by mouth daily.   DVT prophylaxis: Lovenox SQ. Code Status: Full code. Family Communication: Her spouse present in the room. Disposition Plan: Admitted for troponin trending, echocardiogram. Consults called:  case discussed with cardiology on-call. Admission status: Observation/telemetry.   Bobette Mo MD [facility] Pager [phone].  If 7PM-7AM, please contact night-coverage [email or web address] Password [email or web address]  10/26/2015, 10:12 PM

## 2015-10-26 NOTE — Patient Instructions (Signed)
To ED emergently via EMS

## 2015-10-27 ENCOUNTER — Observation Stay (HOSPITAL_COMMUNITY): Payer: Medicare HMO

## 2015-10-27 ENCOUNTER — Observation Stay (HOSPITAL_BASED_OUTPATIENT_CLINIC_OR_DEPARTMENT_OTHER): Payer: Medicare HMO

## 2015-10-27 ENCOUNTER — Other Ambulatory Visit: Payer: Self-pay | Admitting: Physician Assistant

## 2015-10-27 DIAGNOSIS — E785 Hyperlipidemia, unspecified: Secondary | ICD-10-CM | POA: Diagnosis not present

## 2015-10-27 DIAGNOSIS — R079 Chest pain, unspecified: Secondary | ICD-10-CM | POA: Diagnosis not present

## 2015-10-27 DIAGNOSIS — R0600 Dyspnea, unspecified: Secondary | ICD-10-CM

## 2015-10-27 DIAGNOSIS — Z853 Personal history of malignant neoplasm of breast: Secondary | ICD-10-CM | POA: Diagnosis not present

## 2015-10-27 DIAGNOSIS — E039 Hypothyroidism, unspecified: Secondary | ICD-10-CM

## 2015-10-27 DIAGNOSIS — Z8249 Family history of ischemic heart disease and other diseases of the circulatory system: Secondary | ICD-10-CM | POA: Diagnosis not present

## 2015-10-27 DIAGNOSIS — M8580 Other specified disorders of bone density and structure, unspecified site: Secondary | ICD-10-CM | POA: Diagnosis not present

## 2015-10-27 DIAGNOSIS — Z7982 Long term (current) use of aspirin: Secondary | ICD-10-CM | POA: Diagnosis not present

## 2015-10-27 DIAGNOSIS — I2510 Atherosclerotic heart disease of native coronary artery without angina pectoris: Secondary | ICD-10-CM | POA: Diagnosis not present

## 2015-10-27 DIAGNOSIS — K219 Gastro-esophageal reflux disease without esophagitis: Secondary | ICD-10-CM | POA: Diagnosis not present

## 2015-10-27 DIAGNOSIS — R0789 Other chest pain: Secondary | ICD-10-CM | POA: Diagnosis not present

## 2015-10-27 DIAGNOSIS — R072 Precordial pain: Secondary | ICD-10-CM

## 2015-10-27 DIAGNOSIS — Z87440 Personal history of urinary (tract) infections: Secondary | ICD-10-CM | POA: Diagnosis not present

## 2015-10-27 LAB — NM MYOCAR MULTI W/SPECT W/WALL MOTION / EF
CHL RATE OF PERCEIVED EXERTION: 18
CSEPED: 7 min
CSEPEW: 7 METS
CSEPPHR: 151 {beats}/min
Exercise duration (sec): 30 s
MPHR: 145 {beats}/min
Percent HR: 104 %
Rest HR: 88 {beats}/min

## 2015-10-27 LAB — TROPONIN I: Troponin I: <0.03 ng/mL (ref <0.03)

## 2015-10-27 LAB — MRSA PCR SCREENING: MRSA BY PCR: NEGATIVE

## 2015-10-27 MED ORDER — TECHNETIUM TC 99M TETROFOSMIN IV KIT
30.0000 | PACK | Freq: Once | INTRAVENOUS | Status: AC | PRN
  Administered 2015-10-27: 30 via INTRAVENOUS

## 2015-10-27 MED ORDER — NITROGLYCERIN 0.4 MG SL SUBL: 0.4000 mg | SUBLINGUAL_TABLET | SUBLINGUAL | 0 refills | Status: DC | PRN

## 2015-10-27 MED ORDER — REGADENOSON 0.4 MG/5ML IV SOLN
0.4000 mg | Freq: Once | INTRAVENOUS | Status: DC
  Filled 2015-10-27: qty 5

## 2015-10-27 MED ORDER — TECHNETIUM TC 99M TETROFOSMIN IV KIT
10.0000 | PACK | Freq: Once | INTRAVENOUS | Status: AC | PRN
Start: 2015-10-27 — End: 2015-10-27
  Administered 2015-10-27: 10 via INTRAVENOUS

## 2015-10-27 NOTE — Consult Note (Signed)
Cardiology Consult    Patient ID: Frances Hoffman MRN: [id number], DOB/AGE: 05/23/1940   Admit date: 10/26/2015 Date of Consult: 10/27/2015  Primary Physician: [name], DO Primary Cardiologist: Dr. Rennis Golden (2015) Requesting Provider: Dr.Ortiz Reason for Consultation: Dyspnea  Patient Profile    75 yo with PMH of Anxiety, depression, cataracts, breast CA, hyperlipidemia, hypothyroidism, and osteopenia who presented to the [facility] ED with progressive dyspnea on exertion for the past couple months, with worsening symptoms over the weekend.  Past Medical History   Past Medical History:  Diagnosis Date  . Adjustment disorder with mixed anxiety and depressed mood 12/12/2007   Qualifier: Diagnosis of  By: Copland MD, Karleen Hampshire    . Cancer (HCC) 2000   breast had lumpectomy/radiation x36,mammo ,no chemo  . Cataract of both eyes   . Depression   . Dysuria-frequency syndrome    takes AZO  . Frozen shoulder   . Gall stones   . GERD 12/12/2007   Qualifier: Diagnosis of  By: Copland MD, Karleen Hampshire    . Hyperlipidemia   . Hypothyroidism 12/12/2007   Qualifier: Diagnosis of  By: Copland MD, Karleen Hampshire    . Osteopenia    BMD 2004, WNL 2008  . Pneumonia   . Recurrent UTI   . Shingles    chronic body pain, left side of body  . Shortness of breath dyspnea    when climbing stairs only  . Wears glasses     Past Surgical History:  Procedure Laterality Date  . BREAST LUMPECTOMY Left 2000  . CHOLECYSTECTOMY N/A 02/21/2015   Procedure: LAPAROSCOPIC CHOLECYSTECTOMY WITH INTRAOPERATIVE CHOLANGIOGRAM;  Surgeon: Gaynelle Adu, MD;  Location: [facility] OR;  Service: General;  Laterality: N/A;  . COLONOSCOPY    . MULTIPLE TOOTH EXTRACTIONS      [id number] Allergies  Allergies  Allergen Reactions  . Ciprofloxacin Other (See Comments)    Unknown (vertigo??)  . Citalopram Hydrobromide Other (See Comments)    Intrusive/ odd thoughts   . Flagyl [Metronidazole] Other (See Comments)    Unknown??  .  Oxycodone Other (See Comments)    Headaches    History of Present Illness    Frances Hoffman is a 75 year old female with past medical history of Anxiety, depression, cataracts, breast CA, hyperlipidemia, hypothyroidism, and osteopenia. She was seen back in 08/2013 by Dr. Rennis Golden whenever she experienced progressive dyspnea with exertion. At that time the decision was made to send her for exercise Myoview to further evaluate her exertional dyspnea. Myoview showed normal LV function, no wall motion abnormality, an EF estimated at 76%. He also had a 2-D echocardiogram at that time revealing the same results.  Reports over the past couple months she has began to experience similar symptoms of progressive dyspnea on exertion. States that symptoms mostly come with activity, but she has also experiences dyspnea while at rest, and reports that uncomfortable whenever she is breathing at times. Reports she went to [facility] last week and experienced worsening symptoms while hiking. Denies any anginal symptoms but did experience associated diaphoresis. Denies any nausea, vomiting, dizziness, palpitations, or lower extremity edema. Reports she experienced these symptoms again yesterday and presented to her PCP who in review of her EKG was concerned and gave her ASA, and sublingual nitroglycerin and sent her to the ED for further evaluation. In talking with her she reports family history of maternal grandparents with history of CAD. Reports her grandfather passed of an MI in his late 41s. Also reports her sister receiving five-vessel  CABG in her late 60s. Denies any CAD with her mother father. He reports a history of hyperlipidemia, and being on a statin in the past, but has since stopped taking and reports her last lab values were normal. Does report a brief history of smoking when she was in her teens.  In the ED her labs showed normal electrolytes, stable hemoglobin, d-dimer of 1.0, and troponin cycled and  negative 3. She underwent a CTA which was negative for PE, but showed calcified plaque in the LAD and RCA. Chest x-ray was negative. She was placed on Nitropaste which ultimately relieved her symptoms. She was admitted to internal medicine for further management.  Inpatient Medications    . calcium carbonate  1 tablet Oral Q supper  . docusate sodium  100 mg Oral Daily  . enoxaparin (LOVENOX) injection  40 mg Subcutaneous Q24H  . levothyroxine  75 mcg Oral QAC breakfast  . multivitamin with minerals  1 tablet Oral Daily  . nitroGLYCERIN  1 inch Topical Once  . pantoprazole  40 mg Oral Daily  . sodium chloride flush  3 mL Intravenous Q12H  . vitamin C  500 mg Oral Daily    Family History    Family History  Problem Relation Age of Onset  . Osteoporosis Mother   . Parkinsonism Father   . Cancer Sister     breast CA  . Cancer Other     breast CA  . Coronary artery disease Sister   . Heart disease Maternal Grandmother   . Heart disease Maternal Grandfather     Social History    Social History   Social History  . Marital status: Married    Spouse name: N/A  . Number of children: N/A  . Years of education: N/A   Occupational History  . Not on file.   Social History Main Topics  . Smoking status: Never Smoker  . Smokeless tobacco: Never Used  . Alcohol use No  . Drug use: No  . Sexual activity: No   Other Topics Concern  . Not on file   Social History Narrative  . No narrative on file     Review of Systems    General:  No chills, fever, night sweats or weight changes.  Cardiovascular:  No chest pain, dyspnea on exertion, edema, orthopnea, palpitations, paroxysmal nocturnal dyspnea. Dermatological: No rash, lesions/masses Respiratory: No cough, dyspnea Urologic: No hematuria, dysuria Abdominal:   No nausea, vomiting, diarrhea, bright red blood per rectum, melena, or hematemesis Neurologic:  No visual changes, wkns, changes in mental status. All other systems  reviewed and are otherwise negative except as noted above.  Physical Exam    Blood pressure 117/67, pulse 76, temperature 97.9 F (36.6 C), temperature source Oral, resp. rate 18, height 5\' 4"  (1.626 m), weight 169 lb 12.8 oz (77 kg), SpO2 96 %.  General: Pleasant Well-developed, well-nourished Caucasian female, NAD Psych: Normal affect. Neuro: Alert and oriented X 3. Moves all extremities spontaneously. HEENT: Normal  Neck: Supple without bruits or JVD. Lungs:  Resp regular and unlabored, CTA. Heart: RRR no s3, s4, 1/6 systolic murmur. Abdomen: Soft, non-tender, non-distended, BS + x 4.  Extremities: No clubbing, cyanosis or edema. DP/PT/Radials 2+ and equal bilaterally.  Labs    Troponin [facility] Test)  Recent Labs  10/26/15 1929  TROPIPOC 0.00    Recent Labs  10/26/15 2148 10/27/15 0431  TROPONINI <0.03 <0.03   Lab Results  Component Value Date   WBC  8.3 10/26/2015   HGB 14.4 10/26/2015   HCT 43.3 10/26/2015   MCV 94.5 10/26/2015   PLT 260 10/26/2015    Recent Labs Lab 10/26/15 1558 10/26/15 2148  NA 141  --   K 3.9  --   CL 104  --   CO2 28  --   BUN 12  --   CREATININE 1.00 1.06*  CALCIUM 9.7  --   GLUCOSE 97  --    Lab Results  Component Value Date   CHOL 182 04/25/2015   HDL 44.30 04/25/2015   LDLCALC 109 (H) 04/25/2015   TRIG 146.0 04/25/2015   Lab Results  Component Value Date   DDIMER 1.00 (H) 10/26/2015     Radiology Studies    Dg Chest 2 View  Result Date: 10/26/2015 CLINICAL DATA:  Three days of shortness of breath; history of breast malignancy, hyperlipidemia EXAM: CHEST  2 VIEW COMPARISON:  PA and lateral chest x-ray of April 04, 2014 FINDINGS: The lungs are adequately inflated and clear. The heart and pulmonary vascularity are normal. There is no pleural effusion or pneumothorax. There is calcification in the wall of the thoracic aorta. The bony thorax exhibits no acute abnormality. IMPRESSION: There is no pneumonia, CHF,  evidence of metastatic disease, nor other acute cardiopulmonary abnormality. Aortic atherosclerosis. Electronically Signed   By: David  [place]aziland M.D.   On: 10/26/2015 16:52   Ct Angio Chest Pe W And/or Wo Contrast  Result Date: 10/26/2015 CLINICAL DATA:  Chest pain. EXAM: CT ANGIOGRAPHY CHEST WITH CONTRAST TECHNIQUE: Multidetector CT imaging of the chest was performed using the standard protocol during bolus administration of intravenous contrast. Multiplanar CT image reconstructions and MIPs were obtained to evaluate the vascular anatomy. CONTRAST:  100 mL Isovue 370 IV COMPARISON:  Chest x-ray earlier today. FINDINGS: Cardiovascular: The pulmonary arteries are well opacified. There is no evidence of pulmonary embolism. The thoracic aorta is normal in caliber without evidence of aneurysmal disease. The heart size is normal. No pericardial fluid identified. Coronary atherosclerosis present with calcified plaque in the distribution of the LAD and also likely the right coronary artery. Mediastinum/Nodes: No evidence of mediastinal masses. No mediastinal or hilar lymphadenopathy identified. Lungs/Pleura: Lungs show scattered scarring and atelectasis without evidence of airspace consolidation, edema, nodule or pneumothorax. No pleural fluid identified. Upper Abdomen: Visualized upper abdomen shows low density structure in the superior liver measuring approximately 1.9 cm in greatest diameter and likely representing a cyst. Internal density is consistent with a low density cyst. The gallbladder has been removed. Musculoskeletal: No bony abnormalities identified. Review of the MIP images confirms the above findings. IMPRESSION: 1. No evidence of pulmonary embolism. 2. Coronary atherosclerosis with calcified plaque in the distribution of the LAD and RCA. 3. Low-density cyst within the dome of the liver which appears benign and measures approximately 1.9 cm in greatest diameter. Electronically Signed   By: Irish Lack  M.D.   On: 10/26/2015 18:59    ECG & Cardiac Imaging    EKG: SR with TWI in v1, v2, v3 (noted in previous tracings)   Echo: 09/03/2013  Study Conclusions  - Left ventricle: The cavity size was normal. Systolic function was normal. The estimated ejection fraction was in the range of 55% to 60%. Wall motion was normal; there were no regional wall motion abnormalities. Doppler parameters are consistent with abnormal left ventricular relaxation (grade 1 diastolic dysfunction).  Impressions:  - Normal LV function; grade 1 diastolic dysfunction; trivial MR and TR.  Assessment &  Plan    75 yo female with PMH of anxiety, depression, cataracts, breast CA, hyperlipidemia, hypothyroidism, and osteopenia who presented to the [facility] ED with progressive dyspnea on exertion for the past couple months, with worsening symptoms over the weekend.  1. Dyspnea: Reports a progression of symptoms over the past couple of months, that became worse while with was hiking in [place]. States she did about 1 1/2 miles when she started to experienced symptoms. Also reports mild left arm pain intermittently over the past couple of weeks not typically associated with rest or exertion. In the ED her labs should normal electrolytes, stable Hgb and trop neg x3. EKG shows known TWI in v1, v2, v3, but no acute changes. Reports strong family hx with sister having multivessel CABG in her last 95s, and grandfather passed of MI in his 57s.  -- Underwent normal exercise stress test and 2D echo in 2015. CTA this admission showed calcified plaque in the LAD and RCA. Will plan for exercise myoview today, and follow up on results. Repeat 2D echo pending.    Janice Coffin, NP-C Pager [phone] 10/27/2015, 9:04 AM As above, patient seen and examined. Briefly she is a 75 year old female with past medical history of breast cancer, hyperlipidemia, hypothyroidism for evaluation of dyspnea. Patient had a  previous nuclear study in 2015 that was normal. She is somewhat nondescript in her symptoms. However she describes dyspnea for approximately 6 months. She feels like she "can't take a deep breath" at rest. She also noticed increased dyspnea on exert[place] the [place] recently. She has not had chest pain. She was admitted and enzymes are negative. Electrocardiogram shows sinus rhythm with nonspecific anterior T-wave changes unchanged compared to previous. CT shows no pulmonary embolus.  1 dyspnea-Patient symptoms are somewhat nondescript. Unclear if this is anginal equivalent. She does have family history of coronary disease. Plan to proceed with stress nuclear study for risk stratification. If abnormal she will require cardiac catheterization. Olga Millers, MD

## 2015-10-27 NOTE — Discharge Summary (Signed)
Discharge Summary  Frances Hoffman:[id number] DOB: 25-Sep-1940  PCP: [name], DO  Admit date: 10/26/2015 Discharge date: 10/27/2015  Time spent: <69mins  Recommendations for Outpatient Follow-up:  1. F/u with PMD within a week  for hospital discharge follow up, repeat cbc/bmp at follow up 2. F/u with cardiology as scheduled  Discharge Diagnoses:  Active Hospital Problems   Diagnosis Date Noted  . Chest pain 10/26/2015  . Hypothyroidism 12/12/2007  . Hyperlipidemia 12/12/2007  . GERD 12/12/2007    Resolved Hospital Problems   Diagnosis Date Noted Date Resolved  No resolved problems to display.    Discharge Condition: stable  Diet recommendation: heart healthy  Filed Weights   10/26/15 1531 10/26/15 2200  Weight: 76.2 kg (168 lb) 77 kg (169 lb 12.8 oz)    History of present illness:  Chief Complaint: Dyspnea.  HPI: Frances Hoffman is a 75 y.o. female with medical history significant of anxiety, depression, cataracts, dysuria frequency syndrome, breast cancer, GERD, hyperlipidemia, hypothyroidism, osteopenia, recurrent UTIs, herpes zoster, who comes to the emergency department due to increasingly frequent and progressively worse dyspnea for the past 2 weeks.  Per patient, she has been having dyspnea while at rest or while asserting for the past 2 weeks, but denies chest pain. She is states that she noticed that her symptoms were getting worse while she was hiking in [place]. However, she has had pressure-like sensation, which she does not describe as pain, during these episodes associated with diaphoresis. She denies productive cough, dizziness, palpitations, PND, orthopnea or pitting edema of the lower extremities. She has a history of hyperlipidemia, positive family history of CAD, denies cigarette smoking, alcohol or drug use.  She subsequently went to see her doctor today. She was given aspirin, sublingual nitroglycerin and referred to the  emergency department. She reports resolution of the symptoms after nitroglycerin. She is currently on nitro paste in the emergency department.  ED Course: Workup shows negative troponin, no acute cardiopulmonary pathology on chest radiograph and CT scan angiogram shows no PE, but significant coronary arteries calcification.  Hospital Course:  Principal Problem:   Chest pain Active Problems:   Hypothyroidism   Hyperlipidemia   GERD   Dyspnea with exertion CTA chest no PE, does show calcified plaque in LAD and RCA negative troponin levels. EKG t wave inversion on anterior leads v1/v2/v3 Cardiology consulted, stress test low risk , she is cleared to discharge home by cardiology with outpatient cardiology follow up.   Active Problems:   Hypothyroidism Continue levothyroxine 75 g by mouth daily. Monitor TSH periodically.    Hyperlipidemia Patient is currently taking omega-3 fatty acids. pmd to monitor fasting lipids     GERD Pantoprazole 40 mg by mouth daily.   DVT prophylaxis while in the hospital: Lovenox SQ. Code Status: Full code. Family Communication: Her spouse present in the room. Consults called:   cardiology    Procedures:  Cardiac stress test   Discharge Exam: BP 122/62 (BP Location: Left Arm)   Pulse 91   Temp 97.9 F (36.6 C) (Oral)   Resp 16   Ht 5\' 4"  (1.626 m)   Wt 77 kg (169 lb 12.8 oz)   SpO2 95%   BMI 29.15 kg/m   General: NAD Cardiovascular: RRR Respiratory: CTABL  Discharge Instructions You were cared for by a hospitalist during your hospital stay. If you have any questions about your discharge medications or the care you received while you were in the hospital after you are  discharged, you can call the unit and asked to speak with the hospitalist on call if the hospitalist that took care of you is not available. Once you are discharged, your primary care physician will handle any further medical issues. Please note that NO REFILLS for  any discharge medications will be authorized once you are discharged, as it is imperative that you return to your primary care physician (or establish a relationship with a primary care physician if you do not have one) for your aftercare needs so that they can reassess your need for medications and monitor your lab values.  Discharge Instructions    Diet - low sodium heart healthy    Complete by:  As directed   Increase activity slowly    Complete by:  As directed       Medication List    TAKE these medications   aspirin-sod bicarb-citric acid 325 MG Tbef tablet Commonly known as:  ALKA-SELTZER Take 325 mg by mouth 2 (two) times daily as needed (for shortness of breath/bloated feeling).   b complex vitamins tablet Take 1 tablet by mouth daily.   calcium carbonate 1250 (500 Ca) MG tablet Commonly known as:  OS-CAL - dosed in mg of elemental calcium Take 1 tablet by mouth daily.   carboxymethylcellulose 0.5 % Soln Commonly known as:  REFRESH PLUS Place 1 drop into both eyes 3 (three) times daily as needed (for dryness).   FISH OIL PO Take 1 capsule by mouth daily.   levothyroxine 75 MCG tablet Commonly known as:  SYNTHROID, LEVOTHROID Take 1 tablet (75 mcg total) by mouth daily.   multivitamin tablet Take 1 tablet by mouth daily.   nitroGLYCERIN 0.4 MG SL tablet Commonly known as:  NITROSTAT Place 1 tablet (0.4 mg total) under the tongue every 5 (five) minutes as needed for chest pain.   omeprazole 20 MG tablet Commonly known as:  PRILOSEC OTC Take 20 mg by mouth at bedtime.   vitamin C 500 MG tablet Commonly known as:  ASCORBIC ACID Take 500 mg by mouth daily.   Vitamin D3 2000 units capsule Take 2,000 Units by mouth daily.      Allergies  Allergen Reactions  . Ciprofloxacin Other (See Comments)    Unknown (vertigo??)  . Citalopram Hydrobromide Other (See Comments)    Intrusive/ odd thoughts   . Flagyl [Metronidazole] Other (See Comments)    Unknown??  .  Oxycodone Other (See Comments)    Headaches   Follow-up Information    [name], DO Follow up in 1 week(s).   Specialty:  Family Medicine Why:  hospital discharge follow up Contact information: [address] [place] [address] [phone]        Nicolasa Ducking, NP Follow up on 11/24/2015.   Specialties:  Nurse Practitioner, Cardiology, Radiology Why:  See provider for Dr Tresa Endo at 10:00 am.  Contact information: [address] [place] [address] [phone]            The results of significant diagnostics from this hospitalization (including imaging, microbiology, ancillary and laboratory) are listed below for reference.    Significant Diagnostic Studies: Dg Chest 2 View  Result Date: 10/26/2015 CLINICAL DATA:  Three days of shortness of breath; history of breast malignancy, hyperlipidemia EXAM: CHEST  2 VIEW COMPARISON:  PA and lateral chest x-ray of April 04, 2014 FINDINGS: The lungs are adequately inflated and clear. The heart and pulmonary vascularity are normal. There is no pleural effusion or pneumothorax.  There is calcification in the wall of the thoracic aorta. The bony thorax exhibits no acute abnormality. IMPRESSION: There is no pneumonia, CHF, evidence of metastatic disease, nor other acute cardiopulmonary abnormality. Aortic atherosclerosis. Electronically Signed   By: David  [place] M.D.   On: 10/26/2015 16:52   Ct Angio Chest Pe W And/or Wo Contrast  Result Date: 10/26/2015 CLINICAL DATA:  Chest pain. EXAM: CT ANGIOGRAPHY CHEST WITH CONTRAST TECHNIQUE: Multidetector CT imaging of the chest was performed using the standard protocol during bolus administration of intravenous contrast. Multiplanar CT image reconstructions and MIPs were obtained to evaluate the vascular anatomy. CONTRAST:  100 mL Isovue 370 IV COMPARISON:  Chest x-ray earlier today. FINDINGS: Cardiovascular: The pulmonary arteries are well opacified. There is no  evidence of pulmonary embolism. The thoracic aorta is normal in caliber without evidence of aneurysmal disease. The heart size is normal. No pericardial fluid identified. Coronary atherosclerosis present with calcified plaque in the distribution of the LAD and also likely the right coronary artery. Mediastinum/Nodes: No evidence of mediastinal masses. No mediastinal or hilar lymphadenopathy identified. Lungs/Pleura: Lungs show scattered scarring and atelectasis without evidence of airspace consolidation, edema, nodule or pneumothorax. No pleural fluid identified. Upper Abdomen: Visualized upper abdomen shows low density structure in the superior liver measuring approximately 1.9 cm in greatest diameter and likely representing a cyst. Internal density is consistent with a low density cyst. The gallbladder has been removed. Musculoskeletal: No bony abnormalities identified. Review of the MIP images confirms the above findings. IMPRESSION: 1. No evidence of pulmonary embolism. 2. Coronary atherosclerosis with calcified plaque in the distribution of the LAD and RCA. 3. Low-density cyst within the dome of the liver which appears benign and measures approximately 1.9 cm in greatest diameter. Electronically Signed   By: Irish Lack M.D.   On: 10/26/2015 18:59   Nm Myocar Multi W/spect W/wall Motion / Ef  Result Date: 10/27/2015 CLINICAL DATA:  Chest pain, shortness of breath, gastroesophageal reflux disease. EXAM: MYOCARDIAL IMAGING WITH SPECT (REST AND EXERCISE) GATED LEFT VENTRICULAR WALL MOTION STUDY LEFT VENTRICULAR EJECTION FRACTION TECHNIQUE: Standard myocardial SPECT imaging was performed after resting intravenous injection of 10 mCi Tc-39m tetrofosmin. Subsequently, exercise tolerance test was performed by the patient under the supervision of the Cardiology staff. At peak-stress, 30 mCi Tc-42m tetrofosmin was injected intravenously and standard myocardial SPECT imaging was performed. Quantitative gated  imaging was also performed to evaluate left ventricular wall motion, and estimate left ventricular ejection fraction. COMPARISON:  None. FINDINGS: Perfusion: No decreased activity in the left ventricle on stress imaging to suggest reversible ischemia or infarction. Wall Motion: Normal left ventricular wall motion. No left ventricular dilation. Left Ventricular Ejection Fraction: 78 % End diastolic volume 32 ml End systolic volume 7 ml IMPRESSION: 1. No reversible ischemia or infarction. 2. Normal left ventricular wall motion. 3. Left ventricular ejection fraction 78% 4. Non invasive risk stratification*: Low *2012 Appropriate Use Criteria for Coronary Revascularization Focused Update: J Am Coll Cardiol. 2012;59(9):857-881. http://content.dementiazones.com.aspx?articleid=[email or web address] Electronically Signed   By: Judie Petit.  Shick M.D.   On: 10/27/2015 14:17    Microbiology: Recent Results (from the past 240 hour(s))  MRSA PCR Screening     Status: None   Collection Time: 10/26/15 10:48 PM  Result Value Ref Range Status   MRSA by PCR NEGATIVE NEGATIVE Final    Comment:        The GeneXpert MRSA Assay (FDA approved for NASAL specimens only), is one component of a comprehensive MRSA colonization surveillance program. It  is not intended to diagnose MRSA infection nor to guide or monitor treatment for MRSA infections.      Labs: Basic Metabolic Panel:  Recent Labs Lab 10/26/15 1558 10/26/15 2148  NA 141  --   K 3.9  --   CL 104  --   CO2 28  --   GLUCOSE 97  --   BUN 12  --   CREATININE 1.00 1.06*  CALCIUM 9.7  --    Liver Function Tests: No results for input(s): AST, ALT, ALKPHOS, BILITOT, PROT, ALBUMIN in the last 168 hours. No results for input(s): LIPASE, AMYLASE in the last 168 hours. No results for input(s): AMMONIA in the last 168 hours. CBC:  Recent Labs Lab 10/26/15 1558 10/26/15 2148  WBC 7.1 8.3  HGB 14.4 14.4  HCT 44.0 43.3  MCV 94.8 94.5  PLT 260 260   Cardiac  Enzymes:  Recent Labs Lab 10/26/15 2148 10/27/15 0431  TROPONINI <0.03 <0.03   BNP: BNP (last 3 results) No results for input(s): BNP in the last 8760 hours.  ProBNP (last 3 results) No results for input(s): PROBNP in the last 8760 hours.  CBG: No results for input(s): GLUCAP in the last 168 hours.     SignedAlbertine Grates MD, PhD  [facility] 10/27/2015, 5:18 PM

## 2015-10-27 NOTE — Care Management Obs Status (Signed)
[facility] NOTIFICATION   Patient Details  Name: Frances Hoffman MRN: [id number] Date of Birth: Apr 20, 1940   Medicare Observation Status Notification Given:  Yes    Dawayne Patricia, RN 10/27/2015, 4:39 PM

## 2015-10-27 NOTE — Progress Notes (Signed)
Exercise MV performed. Pt hit HR target in stage 1. MV injected. 1 day study, [place] to read.  Rosaria Ferries, Hershal Coria 10/27/2015 12:16 PM Beeper [phone]

## 2015-10-27 NOTE — Progress Notes (Signed)
Dr Stanford Breed reviewed the MV and pt ok for d/c since it is low risk. OP echo and f/u with cardiology.  Lenoard Aden 10/27/2015 4:25 PM Beeper [phone]

## 2015-11-01 ENCOUNTER — Ambulatory Visit (INDEPENDENT_AMBULATORY_CARE_PROVIDER_SITE_OTHER): Payer: Medicare HMO | Admitting: Family Medicine

## 2015-11-01 ENCOUNTER — Encounter: Payer: Self-pay | Admitting: Family Medicine

## 2015-11-01 VITALS — BP 125/77 | HR 89 | Wt 167.7 lb

## 2015-11-01 DIAGNOSIS — F4323 Adjustment disorder with mixed anxiety and depressed mood: Secondary | ICD-10-CM | POA: Diagnosis not present

## 2015-11-01 DIAGNOSIS — R69 Illness, unspecified: Secondary | ICD-10-CM | POA: Diagnosis not present

## 2015-11-01 DIAGNOSIS — K142 Median rhomboid glossitis: Secondary | ICD-10-CM

## 2015-11-01 DIAGNOSIS — E663 Overweight: Secondary | ICD-10-CM | POA: Diagnosis not present

## 2015-11-01 MED ORDER — MAGIC MOUTHWASH W/LIDOCAINE: 5.0000 mL | Freq: Three times a day (TID) | ORAL | 0 refills | Status: DC

## 2015-11-01 NOTE — Patient Instructions (Addendum)
-    AHA guidelines for exercise of 150 minutes of moderate intensity aerobic activity per week discussed and encouraged.   Discussed how regular exercise will improve brain function and memory, as well as improve mood, boost immune system and help with weight management, among the other, more well-known effects of exercise such as decreasing risk for hypertension, diabetes, hyperlipidemia etc.    -  The AHA strongly endorses consumption of a diet that contains a variety of foods from all the food categories with an emphasis on fruits and vegetables; fat-free and low-fat dairy products; cereal and grain products; legumes and nuts; and fish, poultry, and or lean meats.   Excessive food intake, especially of foods high in saturated and trans fats, sugar, and salt, should be avoided     Do not drink any ear anything carbonated and acidic. Avoid tomatoes and other acidic foods as well as citruses such as oranges, lines, lemons etc.     Thrush, Adult  Frances Hoffman is an infection that can happen on the mouth, throat, tongue, or other areas. It causes white patches to form on the mouth and tongue. HOME CARE  Only take medicine as told by your doctor. You may be given medicine to swallow or to apply right on the area.  Eat plain yogurt that contains live cultures (check the label).  Rinse your mouth many times a day with a warm saltwater rinse. To make the rinse, mix 1 teaspoon (6 g) of salt in 8 ounces (0.2 L) of warm water. To reduce pain:  Drink cold liquids such as water or iced tea.  Eat frozen ice pops or frozen juices.  Eat foods that are easy to swallow, such as gelatin or ice cream.  Drink from a straw if the patches are painful. If you are breastfeeding:  Clean your nipples with an antifungal medicine.  Dry your nipples after breastfeeding.  Use an ointment called lanolin to help relieve nipple soreness. If you wear dentures:  Take out your dentures before going to bed.  Brush  them thoroughly.  Soak them in a denture cleaner. GET HELP IF:   Your problems are getting worse.  Your problems are not improving within 7 days of starting treatment.  Your infection is spreading. This may show as white patches on the skin outside of your mouth.  You are nursing and have redness and pain in the nipples. MAKE SURE YOU:  Understand these instructions.  Will watch your condition.  Will get help right away if you are not doing well or get worse.   This information is not intended to replace advice given to you by your health care provider. Make sure you discuss any questions you have with your health care provider.   Document Released: 05/16/2009 Document Revised: 12/10/2012 Document Reviewed: 09/22/2012 Elsevier Interactive Patient Education [employer].

## 2015-11-01 NOTE — Assessment & Plan Note (Signed)
-   Explained to patient she must avoid acidic and irritating substances such as soda, citrus etc. - If patient doesn't find relief with current treatment Magic mouthwash, we can treat with Diflucan 200 mg 1 then 100 mg daily for 7 days or or Mycelex troches- 10 mg 5 times daily for 14 days.

## 2015-11-01 NOTE — Progress Notes (Signed)
Impression and Recommendations:    1. Glossitis rhomboidea mediana   2. Adjustment disorder with mixed anxiety and depressed mood   3. Overweight      Glossitis rhomboidea mediana - Explained to patient she must avoid acidic and irritating substances such as soda, citrus etc. - If patient doesn't find relief with current treatment Magic mouthwash, we can treat with Diflucan 200 mg 1 then 100 mg daily for 7 days or or Mycelex troches- 10 mg 5 times daily for 14 days.  Adjustment disorder with mixed anxiety and depressed mood Patient did not recall that I told her to start Lexapro at an office visit in the recent past at end of June.  Thus, she has not been taking it. Told patient to start with her pre-existing dose ( apparently she still has whole bottle of 10 mg tabs) which is at 10 mg and take 1 daily for 7-10 days, then increase to 20 mg daily. - Encouraged routine exercise at least 20-30 minutes daily - Advised joining [employer] for socialization and join [employer] - Explained to patient that I believe her " bad memory" is emotionally mediated and I explained that no one can remember and think straight when depressed and acutely anxious.  I highly encouraged her to get out and exercise, and socialize.   Overweight Encouraged weight loss through healthier diet and regular exercise. Handouts provided.   Patient's Medications  New Prescriptions   MAGIC MOUTHWASH W/LIDOCAINE SOLN    Take 5 mLs by mouth 3 (three) times daily.  Previous Medications   ASPIRIN-SOD BICARB-CITRIC ACID (ALKA-SELTZER) 325 MG TBEF TABLET    Take 325 mg by mouth 2 (two) times daily as needed (for shortness of breath/bloated feeling).    B COMPLEX VITAMINS TABLET    Take 1 tablet by mouth daily.   CALCIUM CARBONATE (OS-CAL - DOSED IN MG OF ELEMENTAL CALCIUM) 1250 (500 CA) MG TABLET    Take 1 tablet by mouth daily.   CARBOXYMETHYLCELLULOSE (REFRESH PLUS) 0.5 % SOLN    Place 1 drop into both eyes  3 (three) times daily as needed (for dryness).   CHOLECALCIFEROL (VITAMIN D3) 2000 UNITS CAPSULE    Take 2,000 Units by mouth daily.     ESCITALOPRAM (LEXAPRO) 20 MG TABLET    Take 20 mg by mouth daily. Historical med   LEVOTHYROXINE (SYNTHROID, LEVOTHROID) 75 MCG TABLET    Take 1 tablet (75 mcg total) by mouth daily.   MULTIPLE VITAMIN (MULTIVITAMIN) TABLET    Take 1 tablet by mouth daily.     NITROGLYCERIN (NITROSTAT) 0.4 MG SL TABLET    Place 1 tablet (0.4 mg total) under the tongue every 5 (five) minutes as needed for chest pain.   OMEGA-3 FATTY ACIDS (FISH OIL PO)    Take 1 capsule by mouth daily.   OMEPRAZOLE (PRILOSEC OTC) 20 MG TABLET    Take 20 mg by mouth at bedtime.   VITAMIN C (ASCORBIC ACID) 500 MG TABLET    Take 500 mg by mouth daily.  Modified Medications   No medications on file  Discontinued Medications   No medications on file    Return in about 4 weeks (around 11/29/2015) for Follow-up on her mood since she restarted Lexapro this office visit.  The patient was counseled, risk factors were discussed, anticipatory guidance given.  Gross side effects, risk and benefits, and alternatives of medications discussed with patient.  Patient is aware that all medications have potential  side effects and we are unable to predict every side effect or drug-drug interaction that may occur.  Expresses verbal understanding and consents to current therapy plan and treatment regimen.  Please see AVS handed out to patient at the end of our visit for further patient instructions/ counseling done pertaining to today's office visit.    Note: This document was prepared using Dragon voice recognition software and may include unintentional dictation  errors.   --------------------------------------------------------------------------------------------------------------------------------------------------------------------------------------------------------------------------------------------    Subjective:    CC:  Chief Complaint  Patient presents with  . Other    tongue feels "burnt"    HPI: NIRANJANA HOLZER is a 75 y.o. female who presents to [place] [facility] today for issues as discussed below.   Tongue "feels like it has been burnt" or "skin has come off it".   Has altered taste now.  Symptoms started 2-3 weeks ago.   Did not burn her mouth from hot liquid as far as she knows. Doesn't drink coffee or tea.  No other symptoms     Patient has struggled with her mood and depression as well as sometimes feeling anxious in the past.  She was on Lexapro for a while but said it didn't help much so she went off it on her own about 3 months ago. She tolerated the Lexapro well.  He thinks that it might be helpful again.  She tolerated it well.    Patient has not been exercising per my recommendations in the past.  She has not become involved in the [facility] or other activities which she would be with her peers for socialization    Wt Readings from Last 3 Encounters:  11/01/15 167 lb 11.2 oz (76.1 kg)  10/26/15 169 lb 12.8 oz (77 kg)  10/26/15 168 lb 3.2 oz (76.3 kg)   BP Readings from Last 3 Encounters:  11/01/15 125/77  10/27/15 122/62  10/26/15 97/66   Pulse Readings from Last 3 Encounters:  11/01/15 89  10/27/15 91  10/26/15 82     Patient Active Problem List   Diagnosis Date Noted  . 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 Chest pain 10/26/2015  . Depression 09/25/2015  . Asx Hypotension 09/25/2015  . Fecal incontinence 07/29/2015  . Heme positive stool 07/29/2015  . Leg pain 06/06/2015  . Routine general medical examination at a health care facility 04/24/2015  . Overweight 03/15/2015  .  Memory loss 09/22/2014  . Exercise intolerance 07/30/2014  . Estrogen deficiency 04/28/2014  . Tachycardia 04/03/2014  . Family history of heart disease 08/27/2013  . Dyspnea on exertion 08/18/2013  . Positional vertigo 08/18/2013  . Encounter for Medicare annual wellness exam 11/23/2011  . Other screening mammogram 11/23/2011  . Insomnia- due to stress 01/16/2011  . Fatigue 08/15/2009  . Osteopenia 11/26/2008  . NEOPLASM, MALIGNANT, BREAST, HX OF 07/01/2008  . WEIGHT GAIN 01/15/2008  . Hypothyroidism 12/12/2007  . Hyperlipidemia 12/12/2007  . Adjustment disorder with mixed anxiety and depressed mood 12/12/2007  . GERD 12/12/2007  . ADHESIVE CAPSULITIS 12/12/2007    Past Medical history, Surgical history, Family history, Social history, Allergies and Medications have been entered into the medical record, reviewed and changed as needed.   Allergies:  Allergies  Allergen Reactions  . Ciprofloxacin Other (See Comments)    Unknown (vertigo??)  . Citalopram Hydrobromide Other (See Comments)    Intrusive/ odd thoughts   . Flagyl [Metronidazole] Other (See Comments)    Unknown??  . Oxycodone Other (See  Comments)    Headaches    Review of Systems: No fever/ chills, night sweats, no unintended weight loss, No chest pain, or increased shortness of breath. No N/V/D.  Pertinent positives and negatives noted in HPI above    Objective:   Blood pressure 125/77, pulse 89, weight 167 lb 11.2 oz (76.1 kg). Body mass index is 28.79 kg/m. General: Well Developed, well nourished, appropriate for stated age.  Neuro: Alert and oriented x3, extra-ocular muscles intact, sensation grossly intact.  HEENT: Normocephalic, atraumatic, Tongue- slightly red/ possibly enlarged anteriorly; posteriorly in midline of tongue, small discrete, erythematous patches,. No exudative or white curd-like patches or plaques. neck supple, no lymphadenopathy  Skin: Warm and dry, no gross rash. Cardiac: RRR, S1 S2,  no  murmurs rubs or gallops.  Respiratory: Not using accessory muscles, speaking in full sentences-unlabored. Vascular:  No gross lower ext edema, cap RF less 2 sec. Psych: No HI/SI, judgement and insight good, Euthymic mood. Full Affect.

## 2015-11-01 NOTE — Assessment & Plan Note (Addendum)
Patient did not recall that I told her to start Lexapro at an office visit in the recent past at end of June.  Thus, she has not been taking it. Told patient to start with her pre-existing dose ( apparently she still has whole bottle of 10 mg tabs) which is at 10 mg and take 1 daily for 7-10 days, then increase to 20 mg daily. - Encouraged routine exercise at least 20-30 minutes daily - Advised joining [employer] for socialization and join [employer] - Explained to patient that I believe her " bad memory" is emotionally mediated and I explained that no one can remember and think straight when depressed and acutely anxious.  I highly encouraged her to get out and exercise, and socialize.

## 2015-11-01 NOTE — Assessment & Plan Note (Signed)
Encouraged weight loss through healthier diet and regular exercise. Handouts provided.

## 2015-11-03 DIAGNOSIS — D2239 Melanocytic nevi of other parts of face: Secondary | ICD-10-CM | POA: Diagnosis not present

## 2015-11-03 DIAGNOSIS — L720 Epidermal cyst: Secondary | ICD-10-CM | POA: Diagnosis not present

## 2015-11-03 DIAGNOSIS — L820 Inflamed seborrheic keratosis: Secondary | ICD-10-CM | POA: Diagnosis not present

## 2015-11-03 DIAGNOSIS — B078 Other viral warts: Secondary | ICD-10-CM | POA: Diagnosis not present

## 2015-11-09 ENCOUNTER — Other Ambulatory Visit: Payer: Self-pay

## 2015-11-09 ENCOUNTER — Ambulatory Visit (HOSPITAL_COMMUNITY): Payer: Medicare HMO | Attending: Internal Medicine

## 2015-11-09 DIAGNOSIS — I5189 Other ill-defined heart diseases: Secondary | ICD-10-CM | POA: Insufficient documentation

## 2015-11-09 DIAGNOSIS — I351 Nonrheumatic aortic (valve) insufficiency: Secondary | ICD-10-CM | POA: Diagnosis not present

## 2015-11-09 DIAGNOSIS — E785 Hyperlipidemia, unspecified: Secondary | ICD-10-CM | POA: Diagnosis not present

## 2015-11-09 DIAGNOSIS — I071 Rheumatic tricuspid insufficiency: Secondary | ICD-10-CM | POA: Insufficient documentation

## 2015-11-09 DIAGNOSIS — R0600 Dyspnea, unspecified: Secondary | ICD-10-CM | POA: Diagnosis present

## 2015-11-09 DIAGNOSIS — I358 Other nonrheumatic aortic valve disorders: Secondary | ICD-10-CM | POA: Diagnosis not present

## 2015-11-09 DIAGNOSIS — R072 Precordial pain: Secondary | ICD-10-CM | POA: Insufficient documentation

## 2015-11-09 DIAGNOSIS — I517 Cardiomegaly: Secondary | ICD-10-CM | POA: Insufficient documentation

## 2015-11-14 ENCOUNTER — Ambulatory Visit: Payer: Medicare HMO | Admitting: Family Medicine

## 2015-11-16 ENCOUNTER — Telehealth: Payer: Self-pay | Admitting: Family Medicine

## 2015-11-16 NOTE — Telephone Encounter (Signed)
Please call and let patient know that her recent transthoracic echo performed on 9\6\17 was essentially normal.  It showed mild changes which is likely  due to her age but nothing clinically significant.  This is very reassuring.     IF she continues to have this discomfort, please tell her to call and let us know.  We can put in a referral for her to go see the cardiologist and be evaluated by them in their office.  -------------------------------------------------------------------------------------------------- You do not have to discuss this with her but results below:  Impressions:  - LVEF 60-65, mild LVH, normal wall motion, diastolic dysfunction,   indeterminate LV filling pressure, aortic valve sclerosis with   trivial AI, normal LA size, trivial TR, RVSP 22 mmHg, normal IVC.  ------------------------------------------------------------------- Study data:  Comparison was made to the study of 09/03/2013

## 2015-11-16 NOTE — Telephone Encounter (Signed)
Pt informed of echo results.  Pt states that she already has an appointment with cardiology for 11/22/2015.  Advised pt to keep this appointment for further evaluation.  Pt expressed understanding and is agreeable.  [name], CMA

## 2015-11-21 DIAGNOSIS — K591 Functional diarrhea: Secondary | ICD-10-CM | POA: Diagnosis not present

## 2015-11-21 DIAGNOSIS — R195 Other fecal abnormalities: Secondary | ICD-10-CM | POA: Diagnosis not present

## 2015-11-22 ENCOUNTER — Ambulatory Visit (INDEPENDENT_AMBULATORY_CARE_PROVIDER_SITE_OTHER): Payer: Medicare HMO | Admitting: Physician Assistant

## 2015-11-22 ENCOUNTER — Encounter: Payer: Self-pay | Admitting: Physician Assistant

## 2015-11-22 VITALS — BP 95/64 | HR 73 | Ht 64.0 in | Wt 168.8 lb

## 2015-11-22 DIAGNOSIS — R0609 Other forms of dyspnea: Secondary | ICD-10-CM | POA: Diagnosis not present

## 2015-11-22 DIAGNOSIS — I959 Hypotension, unspecified: Secondary | ICD-10-CM | POA: Diagnosis not present

## 2015-11-22 DIAGNOSIS — R072 Precordial pain: Secondary | ICD-10-CM | POA: Diagnosis not present

## 2015-11-22 DIAGNOSIS — R0600 Dyspnea, unspecified: Secondary | ICD-10-CM

## 2015-11-22 NOTE — Progress Notes (Signed)
Cardiology Office Note   Date:  11/22/2015   ID:  Frances Hoffman, DOB 1940/12/15, MRN [id number]  PCP:  [name], DO  Cardiologist:  Dr Rennis Golden 2015  Theodore Demark, PA-C   Chief Complaint  Patient presents with  . Hospitalization Follow-up    History of Present Illness: Frances Hoffman is a 75 y.o. female with a history of anxiety, depression, breast CA, HTN, HLD, osteopenia.  D/c 08/24 after admit for CP/DOE, seen by Dr Jens Som, EF 55% w/ grade 1 dd by echo, MV w/ no isch/inf, EF 78%  Frances Hoffman presents for Follow-up after the above testing  Since discharge from the hospital, she has felt fairly well. She's had no significant chest pain. She still is dealing with fatigue, and occasionally gets a little lightheaded with ambulation. She has not had presyncope or syncope. She is aware that her blood pressure is a bit low and it has been low in the past. She does not drink a great deal of water during the day.  Prior to her hospitalization, she had been on a trip that involved walking a great deal more than usual for her. It was also very hot outside and she was sweating a lot. She wonders if she were not under more physical stress than usual at that time.     Past Medical History:  Diagnosis Date  . Adjustment disorder with mixed anxiety and depressed mood 12/12/2007   Qualifier: Diagnosis of  By: Copland MD, Karleen Hampshire    . Cancer (HCC) 2000   breast had lumpectomy/radiation x36,mammo ,no chemo  . Cataract of both eyes   . Depression   . Dysuria-frequency syndrome    takes AZO  . Frozen shoulder   . Gall stones   . GERD 12/12/2007   Qualifier: Diagnosis of  By: Copland MD, Karleen Hampshire    . Hyperlipidemia   . Hypothyroidism 12/12/2007   Qualifier: Diagnosis of  By: Copland MD, Karleen Hampshire    . Osteopenia    BMD 2004, WNL 2008  . Pneumonia   . Recurrent UTI   . Shingles    chronic body pain, left side of body  . Shortness of breath dyspnea    when climbing stairs  only  . Wears glasses     Past Surgical History:  Procedure Laterality Date  . BREAST LUMPECTOMY Left 2000  . CHOLECYSTECTOMY N/A 02/21/2015   Procedure: LAPAROSCOPIC CHOLECYSTECTOMY WITH INTRAOPERATIVE CHOLANGIOGRAM;  Surgeon: Gaynelle Adu, MD;  Location: [facility] OR;  Service: General;  Laterality: N/A;  . COLONOSCOPY    . MULTIPLE TOOTH EXTRACTIONS      Current Outpatient Prescriptions  Medication Sig Dispense Refill  . b complex vitamins tablet Take 1 tablet by mouth daily.    . calcium carbonate (OS-CAL - DOSED IN MG OF ELEMENTAL CALCIUM) 1250 (500 CA) MG tablet Take 1 tablet by mouth daily.    . carboxymethylcellulose (REFRESH PLUS) 0.5 % SOLN Place 1 drop into both eyes 3 (three) times daily as needed (for dryness).    . Cholecalciferol (VITAMIN D3) 2000 UNITS capsule Take 2,000 Units by mouth daily.      [name] escitalopram (LEXAPRO) 20 MG tablet Take 20 mg by mouth daily. Historical med    . levothyroxine (SYNTHROID, LEVOTHROID) 75 MCG tablet Take 1 tablet (75 mcg total) by mouth daily. 30 tablet 11  . magic mouthwash w/lidocaine SOLN Take 5 mLs by mouth 3 (three) times daily. 500 mL 0  . Multiple Vitamin (MULTIVITAMIN)  tablet Take 1 tablet by mouth daily.      . nitroGLYCERIN (NITROSTAT) 0.4 MG SL tablet Place 1 tablet (0.4 mg total) under the tongue every 5 (five) minutes as needed for chest pain. 30 tablet 0  . Omega-3 Fatty Acids (FISH OIL PO) Take 1 capsule by mouth daily.    [name] omeprazole (PRILOSEC OTC) 20 MG tablet Take 20 mg by mouth at bedtime.    . vitamin C (ASCORBIC ACID) 500 MG tablet Take 500 mg by mouth daily.     Current Facility-Administered Medications  Medication Dose Route Frequency Provider Last Rate Last Dose  . nitroGLYCERIN (NITROSTAT) SL tablet 0.3 mg  0.3 mg Sublingual Q5 min PRN [name], DO   0.3 mg at 10/26/15 1420    Allergies:   Ciprofloxacin; Citalopram hydrobromide; Flagyl [metronidazole]; and Oxycodone    Social History:  The patient  reports  that she has never smoked. She has never used smokeless tobacco. She reports that she does not drink alcohol or use drugs.   Family History:  The patient's family history includes Cancer in her other and sister; Coronary artery disease in her sister; Heart disease in her maternal grandfather and maternal grandmother; Osteoporosis in her mother; Parkinsonism in her father.    ROS:  Please see the history of present illness. All other systems are reviewed and negative.    PHYSICAL EXAM: VS:  BP 95/64   Pulse 73   Ht 5\' 4"  (1.626 m)   Wt 168 lb 12.8 oz (76.6 kg)   BMI 28.97 kg/m  , BMI Body mass index is 28.97 kg/m. GEN: Well nourished, well developed, female in no acute distress  HEENT: normal for age  Neck: no JVD, no carotid bruit, no masses Cardiac: RRR; no murmur, no rubs, or gallops Respiratory:  clear to auscultation bilaterally, normal work of breathing GI: soft, nontender, nondistended, + BS MS: no deformity or atrophy; no edema; distal pulses are 2+ in all 4 extremities   Skin: warm and dry, no rash Neuro:  Strength and sensation are intact Psych: euthymic mood, full affect   EKG:  EKG is ordered today. The ekg ordered today demonstrates sinus rhythm, rate 73 with nonspecific ST-T wave abnormality, no significant change from 08/24   Recent Labs: 04/25/2015: ALT 16 06/13/2015: TSH 3.47 10/26/2015: BUN 12; Creatinine, Ser 1.06; Hemoglobin 14.4; Platelets 260; Potassium 3.9; Sodium 141    Lipid Panel    Component Value Date/Time   CHOL 182 04/25/2015 0812   TRIG 146.0 04/25/2015 0812   HDL 44.30 04/25/2015 0812   CHOLHDL 4 04/25/2015 0812   VLDL 29.2 04/25/2015 0812   LDLCALC 109 (H) 04/25/2015 0812   LDLDIRECT 92.0 04/20/2014 0930     Wt Readings from Last 3 Encounters:  11/22/15 168 lb 12.8 oz (76.6 kg)  11/01/15 167 lb 11.2 oz (76.1 kg)  10/26/15 169 lb 12.8 oz (77 kg)     Other studies Reviewed: Additional studies/ records that were reviewed today  include: Hospital records and testing.  ASSESSMENT AND PLAN:  1.  Chest pain: Her symptoms have resolved. Her cardiac enzymes were negative for MI, and her Myoview was negative for scar or ischemia. Her EF was normal by my review and by echo. No further cardiac workup is indicated. She can follow-up with [place] yearly if she would like.  2. Hypotension: She is currently on thyroid supplementation. She says her TSH has not been checked recently. I have encouraged her to follow-up with her  primary care physician and get it checked. She is to make sure she drinks plenty of water. She understands that it is okay to use salt on her food as long as she is not getting lower extremity edema. Follow-up with primary care.   Current medicines are reviewed at length with the patient today.  The patient does not have concerns regarding medicines.  The following changes have been made:  no change  Labs/ tests ordered today include:  No orders of the defined types were placed in this encounter.   Disposition:   FU with Dr. Rennis Golden  Signed, Leanna Battles  11/22/2015 11:42 AM    [facility] Phone: [phone]; Fax: [phone]  This note was written with the assistance of speech recognition software. Please excuse any transcriptional errors.

## 2015-11-22 NOTE — Patient Instructions (Signed)
Your physician wants you to follow-up in: 1 year or sooner if needed with Frances Hoffman. You will receive a reminder letter in the mail two months in advance. If you don't receive a letter, please call our office to schedule the follow-up appointment.  If you need a refill on your cardiac medications before your next appointment, please call your pharmacy.  It is okay for you to use salt.

## 2015-11-23 ENCOUNTER — Telehealth: Payer: Self-pay | Admitting: Family Medicine

## 2015-11-23 NOTE — Telephone Encounter (Signed)
[facility], please call the pt and have her schedule a fasting lab OV.   They were ordered in July--> but for whatever reason, she never came in to get them as I discussed with her she needed to do.   Thanks, Dr Jenetta Downer.    Unless there is a problem in scheduling her, you don't have to send this back to me to tell me you did. :)   Thnx!

## 2015-11-24 ENCOUNTER — Ambulatory Visit: Payer: Medicare HMO | Admitting: Nurse Practitioner

## 2015-11-28 ENCOUNTER — Ambulatory Visit: Payer: Medicare HMO | Admitting: Family Medicine

## 2015-11-30 ENCOUNTER — Ambulatory Visit (INDEPENDENT_AMBULATORY_CARE_PROVIDER_SITE_OTHER): Payer: Medicare HMO

## 2015-11-30 ENCOUNTER — Other Ambulatory Visit: Payer: Self-pay

## 2015-11-30 ENCOUNTER — Other Ambulatory Visit: Payer: Medicare HMO

## 2015-11-30 DIAGNOSIS — F32A Depression, unspecified: Secondary | ICD-10-CM

## 2015-11-30 DIAGNOSIS — I350 Nonrheumatic aortic (valve) stenosis: Secondary | ICD-10-CM

## 2015-11-30 DIAGNOSIS — F329 Major depressive disorder, single episode, unspecified: Secondary | ICD-10-CM

## 2015-11-30 DIAGNOSIS — Z23 Encounter for immunization: Secondary | ICD-10-CM | POA: Diagnosis not present

## 2015-11-30 DIAGNOSIS — R413 Other amnesia: Secondary | ICD-10-CM

## 2015-11-30 DIAGNOSIS — E785 Hyperlipidemia, unspecified: Secondary | ICD-10-CM

## 2015-11-30 DIAGNOSIS — E038 Other specified hypothyroidism: Secondary | ICD-10-CM

## 2015-11-30 DIAGNOSIS — I950 Idiopathic hypotension: Secondary | ICD-10-CM

## 2015-12-01 ENCOUNTER — Ambulatory Visit: Payer: Medicare HMO | Admitting: Family Medicine

## 2015-12-07 ENCOUNTER — Other Ambulatory Visit (INDEPENDENT_AMBULATORY_CARE_PROVIDER_SITE_OTHER): Payer: Medicare HMO

## 2015-12-07 DIAGNOSIS — F329 Major depressive disorder, single episode, unspecified: Secondary | ICD-10-CM

## 2015-12-07 DIAGNOSIS — I950 Idiopathic hypotension: Secondary | ICD-10-CM

## 2015-12-07 DIAGNOSIS — E038 Other specified hypothyroidism: Secondary | ICD-10-CM

## 2015-12-07 DIAGNOSIS — F3289 Other specified depressive episodes: Secondary | ICD-10-CM | POA: Diagnosis not present

## 2015-12-07 DIAGNOSIS — Z0000 Encounter for general adult medical examination without abnormal findings: Secondary | ICD-10-CM

## 2015-12-07 DIAGNOSIS — E784 Other hyperlipidemia: Secondary | ICD-10-CM

## 2015-12-07 DIAGNOSIS — R5383 Other fatigue: Secondary | ICD-10-CM

## 2015-12-07 DIAGNOSIS — F32A Depression, unspecified: Secondary | ICD-10-CM

## 2015-12-07 DIAGNOSIS — R413 Other amnesia: Secondary | ICD-10-CM

## 2015-12-07 DIAGNOSIS — E785 Hyperlipidemia, unspecified: Secondary | ICD-10-CM

## 2015-12-07 DIAGNOSIS — R0789 Other chest pain: Secondary | ICD-10-CM

## 2015-12-07 DIAGNOSIS — E7849 Other hyperlipidemia: Secondary | ICD-10-CM

## 2015-12-07 DIAGNOSIS — M8580 Other specified disorders of bone density and structure, unspecified site: Secondary | ICD-10-CM

## 2015-12-08 LAB — CBC WITH DIFFERENTIAL/PLATELET
BASOS PCT: 0 %
Basophils Absolute: 0 cells/uL (ref 0–200)
EOS PCT: 3 %
Eosinophils Absolute: 180 cells/uL (ref 15–500)
HEMATOCRIT: 43 % (ref 35.0–45.0)
HEMOGLOBIN: 14.7 g/dL (ref 11.7–15.5)
LYMPHS ABS: 1980 {cells}/uL (ref 850–3900)
Lymphocytes Relative: 33 %
MCH: 31.6 pg (ref 27.0–33.0)
MCHC: 34.2 g/dL (ref 32.0–36.0)
MCV: 92.5 fL (ref 80.0–100.0)
MONO ABS: 660 {cells}/uL (ref 200–950)
MPV: 10.1 fL (ref 7.5–12.5)
Monocytes Relative: 11 %
Neutro Abs: 3180 cells/uL (ref 1500–7800)
Neutrophils Relative %: 53 %
Platelets: 234 10*3/uL (ref 140–400)
RBC: 4.65 MIL/uL (ref 3.80–5.10)
RDW: 14.2 % (ref 11.0–15.0)
WBC: 6 10*3/uL (ref 3.8–10.8)

## 2015-12-08 LAB — COMPLETE METABOLIC PANEL WITH GFR
ALT: 22 U/L (ref 6–29)
AST: 27 U/L (ref 10–35)
Albumin: 3.9 g/dL (ref 3.6–5.1)
Alkaline Phosphatase: 83 U/L (ref 33–130)
BILIRUBIN TOTAL: 0.7 mg/dL (ref 0.2–1.2)
BUN: 14 mg/dL (ref 7–25)
CHLORIDE: 102 mmol/L (ref 98–110)
CO2: 25 mmol/L (ref 20–31)
Calcium: 9.8 mg/dL (ref 8.6–10.4)
Creat: 0.95 mg/dL — ABNORMAL HIGH (ref 0.60–0.93)
GFR, EST AFRICAN AMERICAN: 68 mL/min (ref >=60)
GFR, EST NON AFRICAN AMERICAN: 59 mL/min — AB (ref >=60)
GLUCOSE: 77 mg/dL (ref 65–99)
Potassium: 4.6 mmol/L (ref 3.5–5.3)
SODIUM: 137 mmol/L (ref 135–146)
TOTAL PROTEIN: 6.7 g/dL (ref 6.1–8.1)

## 2015-12-08 LAB — TSH: TSH: 3.18 m[IU]/L

## 2015-12-08 LAB — FOLATE

## 2015-12-08 LAB — HIV ANTIBODY (ROUTINE TESTING W REFLEX): HIV: NONREACTIVE

## 2015-12-08 LAB — LIPID PANEL
Cholesterol: 321 mg/dL — ABNORMAL HIGH (ref 125–200)
HDL: 37 mg/dL — ABNORMAL LOW (ref >=46)
LDL CALC: 227 mg/dL — AB (ref <130)
TRIGLYCERIDES: 285 mg/dL — AB (ref <150)
Total CHOL/HDL Ratio: 8.7 Ratio — ABNORMAL HIGH (ref <=5.0)
VLDL: 57 mg/dL — ABNORMAL HIGH (ref <30)

## 2015-12-08 LAB — HEMOGLOBIN A1C
Hgb A1c MFr Bld: 5.3 % (ref <5.7)
Mean Plasma Glucose: 105 mg/dL

## 2015-12-08 LAB — RPR

## 2015-12-08 LAB — SEDIMENTATION RATE: Sed Rate: 30 mm/hr (ref 0–30)

## 2015-12-08 LAB — VITAMIN D 25 HYDROXY (VIT D DEFICIENCY, FRACTURES): Vit D, 25-Hydroxy: 53 ng/mL (ref 30–100)

## 2015-12-08 LAB — VITAMIN B12: VITAMIN B 12: 1169 pg/mL — AB (ref 200–1100)

## 2015-12-23 ENCOUNTER — Ambulatory Visit (INDEPENDENT_AMBULATORY_CARE_PROVIDER_SITE_OTHER): Payer: Medicare HMO | Admitting: Family Medicine

## 2015-12-23 ENCOUNTER — Encounter: Payer: Self-pay | Admitting: Family Medicine

## 2015-12-23 VITALS — BP 99/68 | HR 76 | Ht 64.0 in | Wt 167.9 lb

## 2015-12-23 DIAGNOSIS — E663 Overweight: Secondary | ICD-10-CM

## 2015-12-23 DIAGNOSIS — I959 Hypotension, unspecified: Secondary | ICD-10-CM

## 2015-12-23 DIAGNOSIS — F4323 Adjustment disorder with mixed anxiety and depressed mood: Secondary | ICD-10-CM | POA: Diagnosis not present

## 2015-12-23 DIAGNOSIS — E782 Mixed hyperlipidemia: Secondary | ICD-10-CM

## 2015-12-23 DIAGNOSIS — F32A Depression, unspecified: Secondary | ICD-10-CM | POA: Insufficient documentation

## 2015-12-23 DIAGNOSIS — E039 Hypothyroidism, unspecified: Secondary | ICD-10-CM

## 2015-12-23 DIAGNOSIS — F329 Major depressive disorder, single episode, unspecified: Secondary | ICD-10-CM | POA: Insufficient documentation

## 2015-12-23 DIAGNOSIS — F331 Major depressive disorder, recurrent, moderate: Secondary | ICD-10-CM | POA: Insufficient documentation

## 2015-12-23 DIAGNOSIS — R69 Illness, unspecified: Secondary | ICD-10-CM | POA: Diagnosis not present

## 2015-12-23 DIAGNOSIS — K219 Gastro-esophageal reflux disease without esophagitis: Secondary | ICD-10-CM

## 2015-12-23 DIAGNOSIS — F3289 Other specified depressive episodes: Secondary | ICD-10-CM

## 2015-12-23 MED ORDER — ATORVASTATIN CALCIUM 20 MG PO TABS: 20.0000 mg | ORAL_TABLET | Freq: Every day | ORAL | 3 refills | Status: DC

## 2015-12-23 NOTE — Patient Instructions (Addendum)
.  low  Guidelines for a Low Cholesterol, Low Saturated Fat Diet   Fats - Limit total intake of fats and oils. - Avoid butter, stick margarine, shortening, lard, palm and coconut oils. - Limit mayonnaise, salad dressings, gravies and sauces, unless they are homemade with low-fat ingredients. - Limit chocolate. - Choose low-fat and nonfat products, such as low-fat mayonnaise, low-fat or non-hydrogenated peanut butter, low-fat or fat-free salad dressings and nonfat gravy. - Use vegetable oil, such as canola or olive oil. - Look for margarine that does not contain trans fatty acids. - Use nuts in moderate amounts. - Read ingredient labels carefully to determine both amount and type of fat present in foods. Limit saturated and trans fats! - Avoid high-fat processed and convenience foods.  Meats and Meat Alternatives - Choose fish, chicken, [place] and lean meats. - Use dried beans, peas, lentils and tofu. - Limit egg yolks to three to four per week. - If you eat red meat, limit to no more than three servings per week and choose loin or round cuts. - Avoid fatty meats, such as bacon, sausage, franks, luncheon meats and ribs. - Avoid all organ meats, including liver.  Dairy - Choose nonfat or low-fat milk, yogurt and cottage cheese. - Most cheeses are high in fat. Choose cheeses made from non-fat milk, such as mozzarella and ricotta cheese. - Choose light or fat-free cream cheese and sour cream. - Avoid cream and sauces made with cream.  Fruits and Vegetables - Eat a wide variety of fruits and vegetables. - Use lemon juice, vinegar or "mist" olive oil on vegetables. - Avoid adding sauces, fat or oil to vegetables.  Breads, Cereals and Grains - Choose whole-grain breads, cereals, pastas and rice. - Avoid high-fat snack foods, such as granola, cookies, pies, pastries, doughnuts and croissants.  Cooking Tips - Avoid deep fried foods. - Trim visible fat off meats and remove skin from  poultry before cooking. - Bake, broil, boil, poach or roast poultry, fish and lean meats. - Drain and discard fat that drains out of meat as you cook it. - Add little or no fat to foods. - Use vegetable oil sprays to grease pans for cooking or baking. - Steam vegetables. - Use herbs or no-oil marinades to flavor foods.

## 2015-12-23 NOTE — Progress Notes (Signed)
Assessment and plan:  1. Acquired hypothyroidism   2. Mixed hyperlipidemia   3. Adjustment disorder with mixed anxiety and depressed mood   4. Overweight   5. Other depression   6. Chronic GERD   7. Hypotension, unspecified hypotension type     Hyperlipidemia Patient will need to restart her Lipitor, as cholesterol is poorly controlled. See levels on labs from 10\4\17.  Dietary changes such as low saturated & trans fat and low carb/ ketogenic diets discussed with patient.  Encouraged regular exercise and weight loss when appropriate.   Educational handouts provided at patient's desire.  Restart Lipitor- medication.   Also, risks and benefits of medications discussed with patient, including alternative treatments.   Encouraged patient to read drug information handouts to further educate self about the medicine prior to starting it.   Contact us prior with any Q's/ concerns.  Hypothyroidism Patient asymptomatic. TSH levels-stable at 3.18 recently. 12/07/15  Asx Hypotension Despite low blood pressure, serum creatinine slightly elevated and GFR just below 60.  Discussed with patient chronic kidney disease-stage III.   Dietary and lifestyle modifications discussed.  Adjustment disorder with mixed anxiety and depressed mood Doing better with the restart of her lexapro- sx stable.   Restart lipitor--->: reck lft in 6 wks  New Prescriptions   ATORVASTATIN (LIPITOR) 20 MG TABLET    Take 1 tablet (20 mg total) by mouth daily at 6 PM.    Modified Medications   No medications on file    Discontinued Medications   MAGIC MOUTHWASH W/LIDOCAINE SOLN    Take 5 mLs by mouth 3 (three) times daily.     Return in about 6 weeks (around 02/03/2016) for reason recheck since starting Lipitor and recheck LFT.  Anticipatory guidance and routine counseling done re: condition, txmnt options and need for follow up. All  questions of patient's were answered.   Gross side effects, risk and benefits, and alternatives of medications discussed with patient.  Patient is aware that all medications have potential side effects and we are unable to predict every sideeffect or drug-drug interaction that may occur.  Expresses verbal understanding and consents to current therapy plan and treatment regiment.  Please see AVS handed out to patient at the end of our visit for additional patient instructions/ counseling done pertaining to today's office visit.  Note: This document was prepared using Dragon voice recognition software and may include unintentional dictation errors.   ----------------------------------------------------------------------------------------------------------------------  Subjective:   CC: Frances Hoffman is a 75 y.o. female who presents to [facility] today for review and discussion of recent bloodwork that was done.  All recent blood work that we ordered  From 10 /4 /17 was reviewed with patient today.  Patient was counseled on all abnormalities and we discussed dietary and lifestyle changes that could help those values (also medications when appropriate).  Extensive health counseling performed and all patient's concerns/ questions were addressed.    Depression-  Doing well.  Feels less tearful, more hopeful, feels good.  Tol well, no s-e.    CHol meds--- hasn't taken one yyr or so--> script ran out and pt never got it RF.      Wt Readings from Last 3 Encounters:  12/23/15 167 lb 14.4 oz (76.2 kg)  11/22/15 168 lb 12.8 oz (76.6 kg)  11/01/15 167 lb 11.2 oz (76.1 kg)   BP Readings from Last 3 Encounters:  12/23/15 99/68  11/22/15 95/64  11/01/15 125/77   Pulse Readings from Last 3 Encounters:  12/23/15 76  11/22/15 73  11/01/15 89   BMI Readings from Last 3 Encounters:  12/23/15 28.82 kg/m  11/22/15 28.97 kg/m  11/01/15 28.79 kg/m     Patient  Care Team    Relationship Specialty Notifications Start End  [name], DO PCP - General Family Medicine  08/24/15   Darrol Jump, PA-C Physician Assistant Cardiology  01/08/16    Comment: She works with Dr Olga Millers !!!!!!     Full medical history updated and reviewed in the office today  Patient Active Problem List   Diagnosis Date Noted  . Depression 12/23/2015  . 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 Chest pain 10/26/2015  . Asx Hypotension 09/25/2015  . Fecal incontinence 07/29/2015  . Heme positive stool 07/29/2015  . Leg pain 06/06/2015  . Routine general medical examination at a health care facility 04/24/2015  . Overweight 03/15/2015  . Memory loss 09/22/2014  . Exercise intolerance 07/30/2014  . Estrogen deficiency 04/28/2014  . Tachycardia 04/03/2014  . Family history of heart disease 08/27/2013  . Dyspnea on exertion 08/18/2013  . Positional vertigo 08/18/2013  . Encounter for Medicare annual wellness exam 11/23/2011  . Other screening mammogram 11/23/2011  . Insomnia- due to stress 01/16/2011  . Fatigue 08/15/2009  . Osteopenia 11/26/2008  . NEOPLASM, MALIGNANT, BREAST, HX OF 07/01/2008  . WEIGHT GAIN 01/15/2008  . Hypothyroidism 12/12/2007  . Hyperlipidemia 12/12/2007  . Adjustment disorder with mixed anxiety and depressed mood 12/12/2007  . Chronic GERD 12/12/2007  . ADHESIVE CAPSULITIS 12/12/2007    Past Medical History:  Diagnosis Date  . Adjustment disorder with mixed anxiety and depressed mood 12/12/2007   Qualifier: Diagnosis of  By: Copland MD, Karleen Hampshire    . Cancer (HCC) 2000   breast had lumpectomy/radiation x36,mammo ,no chemo  . Cataract of both eyes   . Depression   . Dysuria-frequency syndrome    takes AZO  . Frozen shoulder   . Gall stones   . GERD 12/12/2007   Qualifier: Diagnosis of  By: Copland MD, Karleen Hampshire    . Hyperlipidemia   . Hypothyroidism 12/12/2007   Qualifier: Diagnosis of  By: Copland MD, Karleen Hampshire    .  Osteopenia    BMD 2004, WNL 2008  . Pneumonia   . Recurrent UTI   . Shingles    chronic body pain, left side of body  . Shortness of breath dyspnea    when climbing stairs only  . Wears glasses     Past Surgical History:  Procedure Laterality Date  . BREAST LUMPECTOMY Left 2000  . CHOLECYSTECTOMY N/A 02/21/2015   Procedure: LAPAROSCOPIC CHOLECYSTECTOMY WITH INTRAOPERATIVE CHOLANGIOGRAM;  Surgeon: Gaynelle Adu, MD;  Location: [facility] OR;  Service: General;  Laterality: N/A;  . COLONOSCOPY    . MULTIPLE TOOTH EXTRACTIONS      Social History  Substance Use Topics  . Smoking status: Never Smoker  . Smokeless tobacco: Never Used  . Alcohol use No    Family History  Problem Relation Age of Onset  . Osteoporosis Mother   . Parkinsonism Father   . Cancer Sister     breast CA  . Cancer Other     breast CA  . Coronary artery disease Sister   . Heart disease Maternal Grandmother   . Heart disease Maternal Grandfather      Medications: Current Outpatient Prescriptions  Medication Sig Dispense Refill  . b complex vitamins tablet Take  1 tablet by mouth daily.    . calcium carbonate (OS-CAL - DOSED IN MG OF ELEMENTAL CALCIUM) 1250 (500 CA) MG tablet Take 1 tablet by mouth daily.    . carboxymethylcellulose (REFRESH PLUS) 0.5 % SOLN Place 1 drop into both eyes 3 (three) times daily as needed (for dryness).    . Cholecalciferol (VITAMIN D3) 2000 UNITS capsule Take 2,000 Units by mouth daily.      [name] escitalopram (LEXAPRO) 20 MG tablet Take 20 mg by mouth daily. Historical med    . levothyroxine (SYNTHROID, LEVOTHROID) 75 MCG tablet Take 1 tablet (75 mcg total) by mouth daily. 30 tablet 11  . Multiple Vitamin (MULTIVITAMIN) tablet Take 1 tablet by mouth daily.      . nitroGLYCERIN (NITROSTAT) 0.4 MG SL tablet Place 1 tablet (0.4 mg total) under the tongue every 5 (five) minutes as needed for chest pain. 30 tablet 0  . Omega-3 Fatty Acids (FISH OIL PO) Take 1 capsule by mouth daily.    [name]  omeprazole (PRILOSEC OTC) 20 MG tablet Take 20 mg by mouth at bedtime.    . vitamin C (ASCORBIC ACID) 500 MG tablet Take 500 mg by mouth daily.    [name] atorvastatin (LIPITOR) 20 MG tablet Take 1 tablet (20 mg total) by mouth daily at 6 PM. 90 tablet 3   Current Facility-Administered Medications  Medication Dose Route Frequency Provider Last Rate Last Dose  . nitroGLYCERIN (NITROSTAT) SL tablet 0.3 mg  0.3 mg Sublingual Q5 min PRN [name], DO   0.3 mg at 10/26/15 1420    Allergies:  Allergies  Allergen Reactions  . Ciprofloxacin Other (See Comments)    Unknown (vertigo??)  . Citalopram Hydrobromide Other (See Comments)    Intrusive/ odd thoughts   . Flagyl [Metronidazole] Other (See Comments)    Unknown??  . Oxycodone Other (See Comments)    Headaches     ROS: Review of Systems  Constitutional: Positive for diaphoresis and malaise/fatigue. Negative for chills, fever and weight loss.       Fatigue and diaphresis wiith exercise only  HENT: Negative.  Negative for congestion, sore throat and tinnitus.   Eyes: Negative.  Negative for blurred vision, double vision and photophobia.  Respiratory: Positive for shortness of breath. Negative for cough and wheezing.        With exercise  Cardiovascular: Negative.  Negative for chest pain and palpitations.  Gastrointestinal: Negative.  Negative for blood in stool, diarrhea, nausea and vomiting.  Genitourinary: Negative.  Negative for dysuria, frequency and urgency.  Musculoskeletal: Negative.  Negative for joint pain and myalgias.  Skin: Negative.  Negative for itching and rash.  Neurological: Negative.  Negative for dizziness, focal weakness, weakness and headaches.  Endo/Heme/Allergies: Negative.  Negative for environmental allergies and polydipsia. Does not bruise/bleed easily.  Psychiatric/Behavioral: Negative.  Negative for depression and memory loss. The patient is not nervous/anxious and does not have insomnia.     Objective:    Blood pressure 99/68, pulse 76, height 5\' 4"  (1.626 m), weight 167 lb 14.4 oz (76.2 kg). Body mass index is 28.82 kg/m. Gen:   Well NAD, A and O *3 HEENT:    [place]/AT, EOMI,  MMM, OP- clr Lungs:   Normal work of breathing. CTA B/L, no Wh, rhonchi Heart:   RRR, S1, S2 WNL's, no MRG Abd:   No gross distention Exts:    warm, pink,  Brisk capillary refill, warm and well perfused.  Psych:    No HI/SI, judgement  and insight good, Euthymic mood. Full Affect.   Recent Results (from the past 2160 hour(s))  PAP Smear Only (IPS)     Status: None   Collection Time: 10/17/15  9:27 AM  Result Value Ref Range   COMMENTS: [facility]     Comment: [address], [place], [place] [address] [address] [place], [place] [address] GYN CYTOLOGY REPORT  PATIENT NAME:Chaudhari, Aithana S PATHOLOGY#:[id number]: F DOB: 07-16-1940 (Age: 75) MEDICAL RECORD WGNFAO:[id number] DOCTOR:Suzanne Hyacinth Meeker, M.D. DATE OBTAINED:8/14/2017CLIENT:[facility] DATE RECEIVED:8/21/2017OTHER PHYS: DATE SIGNED:10/25/2015 PAP- ThinLayer Final Cytologic Interpretation:       Cervical, ThinLayer with Automated Imaging and Dual Review, CPT 88175      Negative for Intraepithelial Lesions or Malignancy.       ADEQUACY OF SPECIMEN:           Satisfactory for evaluation. Endocervical cells/transformation zone component identified.              OTHER CYTOLOGIC FINDINGS:            Atrophic cell pattern present.       NOTE: This Pap test has been evaluated with computer assisted technology.       Electronically signed by: [employer], [place](ASCP), [address], [place], [place], (Med. Dir.: Eulogio Ditch, MD) pgi/8/22/2017The Pap test is a scree ning mechanism with excellent but not perfect ability to prevent cervical carcinoma.  It has a low, but  significant, diagnostic error rate. The pap test is suboptimal  for detection of glandular lesions.  It should be noted that a negative result does not definitively  rule out the presence of disease.Ref: DeMay, RM, The Art and Science of Cytopathology,  [employer], [phone]. Last Menstrual Period: 03/05/1990 Menstrual/Pregnancy History: Post-menopausal   Cancer History: Breast Cancer: 2000 Technical processing performed at [facility], [address], [address], [place], [place] [address], CLIA# [id number], unless otherwise indicated.   EKG 12-Lead     Status: Abnormal   Collection Time: 10/26/15 12:00 AM  Result Value Ref Range   EKG 12 lead ST elevation   Basic metabolic panel     Status: Abnormal   Collection Time: 10/26/15  3:58 PM  Result Value Ref Range   Sodium 141 135 - 145 mmol/L   Potassium 3.9 3.5 - 5.1 mmol/L   Chloride 104 101 - 111 mmol/L   CO2 28 22 - 32 mmol/L   Glucose, Bld 97 65 - 99 mg/dL   BUN 12 6 - 20 mg/dL   Creatinine, Ser 2.53 0.44 - 1.00 mg/dL   Calcium 9.7 8.9 - 66.4 mg/dL   GFR calc non Af Amer 54 (L) >60 mL/min   GFR calc Af Amer >60 >60 mL/min    Comment: (NOTE) The eGFR has been calculated using the CKD EPI equation. This calculation has not been validated in all clinical situations. eGFR's persistently <60 mL/min signify possible Chronic Kidney Disease.    Anion gap 9 5 - 15  CBC     Status: None   Collection Time: 10/26/15  3:58 PM  Result Value Ref Range   WBC 7.1 4.0 - 10.5 K/uL   RBC 4.64 3.87 - 5.11 MIL/uL   Hemoglobin 14.4 12.0 - 15.0 g/dL   HCT 40.3 47.4 - 25.9 %   MCV 94.8 78.0 - 100.0 fL   MCH 31.0 26.0 - 34.0 pg   MCHC 32.7 30.0 - 36.0 g/dL   RDW 56.3 87.5 - 64.3 %   Platelets 260  150 - 400 K/uL  D-dimer, quantitative (not at [facility])     Status: Abnormal   Collection Time: 10/26/15  3:58 PM  Result Value Ref Range   D-Dimer, Quant 1.00 (H) 0.00 - 0.50 ug/mL-FEU    Comment: (NOTE) At the manufacturer cut-off of 0.50 ug/mL FEU, this assay has been documented to exclude PE with a sensitivity and negative predictive value of 97 to 99%.  At this time, this assay has not been approved by the FDA to  exclude DVT/VTE. Results should be correlated with clinical presentation.   I-stat troponin, ED     Status: None   Collection Time: 10/26/15  4:16 PM  Result Value Ref Range   Troponin i, poc 0.00 0.00 - 0.08 ng/mL   Comment 3            Comment: Due to the release kinetics of cTnI, a negative result within the first hours of the onset of symptoms does not rule out myocardial infarction with certainty. If myocardial infarction is still suspected, repeat the test at appropriate intervals.   I-Stat Troponin, ED (not at [facility])     Status: None   Collection Time: 10/26/15  7:29 PM  Result Value Ref Range   Troponin i, poc 0.00 0.00 - 0.08 ng/mL   Comment 3            Comment: Due to the release kinetics of cTnI, a negative result within the first hours of the onset of symptoms does not rule out myocardial infarction with certainty. If myocardial infarction is still suspected, repeat the test at appropriate intervals.   Troponin I     Status: None   Collection Time: 10/26/15  9:48 PM  Result Value Ref Range   Troponin I <0.03 <0.03 ng/mL  CBC     Status: None   Collection Time: 10/26/15  9:48 PM  Result Value Ref Range   WBC 8.3 4.0 - 10.5 K/uL   RBC 4.58 3.87 - 5.11 MIL/uL   Hemoglobin 14.4 12.0 - 15.0 g/dL   HCT 40.9 81.1 - 91.4 %   MCV 94.5 78.0 - 100.0 fL   MCH 31.4 26.0 - 34.0 pg   MCHC 33.3 30.0 - 36.0 g/dL   RDW 78.2 95.6 - 21.3 %   Platelets 260 150 - 400 K/uL  Creatinine, serum     Status: Abnormal   Collection Time: 10/26/15  9:48 PM  Result Value Ref Range   Creatinine, Ser 1.06 (H) 0.44 - 1.00 mg/dL   GFR calc non Af Amer 50 (L) >60 mL/min   GFR calc Af Amer 58 (L) >60 mL/min    Comment: (NOTE) The eGFR has been calculated using the CKD EPI equation. This calculation has not been validated in all clinical situations. eGFR's persistently <60 mL/min signify possible Chronic Kidney Disease.   MRSA PCR Screening     Status: None   Collection Time: 10/26/15  10:48 PM  Result Value Ref Range   MRSA by PCR NEGATIVE NEGATIVE    Comment:        The GeneXpert MRSA Assay (FDA approved for NASAL specimens only), is one component of a comprehensive MRSA colonization surveillance program. It is not intended to diagnose MRSA infection nor to guide or monitor treatment for MRSA infections.   Troponin I     Status: None   Collection Time: 10/27/15  4:31 AM  Result Value Ref Range   Troponin I <0.03 <0.03 ng/mL  NM Myocar Multi W/Spect  W/Wall Motion / EF     Status: None   Collection Time: 10/27/15  1:09 PM  Result Value Ref Range   Rest HR 88 bpm   Rest BP 120/93 mmHg   RPE 18    Exercise duration (sec) 30 sec   Percent HR 104 %   Exercise duration (min) 7 min   Estimated workload 7.0 METS   Peak HR 151 bpm   Peak BP 173/59 mmHg   MPHR 145 bpm  RPR     Status: None   Collection Time: 12/07/15  8:32 AM  Result Value Ref Range   RPR Ser Ql NON REAC NON REAC  Sed Rate (ESR)     Status: None   Collection Time: 12/07/15  8:32 AM  Result Value Ref Range   Sed Rate 30 0 - 30 mm/hr  Vitamin D (25 hydroxy)     Status: None   Collection Time: 12/07/15  8:32 AM  Result Value Ref Range   Vit D, 25-Hydroxy 53 30 - 100 ng/mL    Comment: Vitamin D Status           25-OH Vitamin D        Deficiency                <20 ng/mL        Insufficiency         20 - 29 ng/mL        Optimal             > or = 30 ng/mL   For 25-OH Vitamin D testing on patients on D2-supplementation and patients for whom quantitation of D2 and D3 fractions is required, the QuestAssureD 25-OH VIT D, (D2,D3), LC/MS/MS is recommended: order code 40981 (patients > 2 yrs).   B12     Status: Abnormal   Collection Time: 12/07/15  8:32 AM  Result Value Ref Range   Vitamin B-12 1,169 (H) 200 - 1,100 pg/mL  CBC w/Diff     Status: None   Collection Time: 12/07/15  8:32 AM  Result Value Ref Range   WBC 6.0 3.8 - 10.8 K/uL   RBC 4.65 3.80 - 5.10 MIL/uL   Hemoglobin 14.7 11.7 -  15.5 g/dL   HCT 19.1 47.8 - 29.5 %   MCV 92.5 80.0 - 100.0 fL   MCH 31.6 27.0 - 33.0 pg   MCHC 34.2 32.0 - 36.0 g/dL   RDW 62.1 30.8 - 65.7 %   Platelets 234 140 - 400 K/uL   MPV 10.1 7.5 - 12.5 fL   Neutro Abs 3,180 1,500 - 7,800 cells/uL   Lymphs Abs 1,980 850 - 3,900 cells/uL   Monocytes Absolute 660 200 - 950 cells/uL   Eosinophils Absolute 180 15 - 500 cells/uL   Basophils Absolute 0 0 - 200 cells/uL   Neutrophils Relative % 53 %   Lymphocytes Relative 33 %   Monocytes Relative 11 %   Eosinophils Relative 3 %   Basophils Relative 0 %   Smear Review Criteria for review not met   TSH     Status: None   Collection Time: 12/07/15  8:32 AM  Result Value Ref Range   TSH 3.18 mIU/L    Comment:   Reference Range   > or = 20 Years  0.40-4.50   Pregnancy Range First trimester  0.26-2.66 Second trimester 0.55-2.73 Third trimester  0.43-2.91     Folate     Status: None  Collection Time: 12/07/15  8:32 AM  Result Value Ref Range   Folate >24.0 >5.4 ng/mL    Comment: Reference Range >17 years:   Low: <3.4 ng/mL              Borderline: 3.4-5.4 ng/mL              Normal: >5.4 ng/mL     COMPLETE METABOLIC PANEL WITH GFR     Status: Abnormal   Collection Time: 12/07/15  8:32 AM  Result Value Ref Range   Sodium 137 135 - 146 mmol/L   Potassium 4.6 3.5 - 5.3 mmol/L   Chloride 102 98 - 110 mmol/L   CO2 25 20 - 31 mmol/L   Glucose, Bld 77 65 - 99 mg/dL   BUN 14 7 - 25 mg/dL   Creat 4.09 (H) 8.11 - 0.93 mg/dL    Comment:   For patients > or = 75 years of age: The upper reference limit for Creatinine is approximately 13% higher for people identified as African-American.      Total Bilirubin 0.7 0.2 - 1.2 mg/dL   Alkaline Phosphatase 83 33 - 130 U/L   AST 27 10 - 35 U/L   ALT 22 6 - 29 U/L   Total Protein 6.7 6.1 - 8.1 g/dL   Albumin 3.9 3.6 - 5.1 g/dL   Calcium 9.8 8.6 - 91.4 mg/dL   GFR, Est African American 68 >=60 mL/min   GFR, Est Non African American 59 (L)  >=60 mL/min  Lipid Profile     Status: Abnormal   Collection Time: 12/07/15  8:32 AM  Result Value Ref Range   Cholesterol 321 (H) 125 - 200 mg/dL   Triglycerides 782 (H) <150 mg/dL   HDL 37 (L) >=95 mg/dL   Total CHOL/HDL Ratio 8.7 (H) <=5.0 Ratio   VLDL 57 (H) <30 mg/dL   LDL Cholesterol 621 (H) <130 mg/dL    Comment:   Total Cholesterol/HDL Ratio:CHD Risk                        Coronary Heart Disease Risk Table                                        Men       Women          1/2 Average Risk              3.4        3.3              Average Risk              5.0        4.4           2X Average Risk              9.6        7.1           3X Average Risk             23.4       11.0 Use the calculated Patient Ratio above and the CHD Risk table  to determine the patient's CHD Risk.   HgB A1c     Status: None   Collection Time: 12/07/15  8:32 AM  Result Value Ref Range   Hgb A1c MFr  Bld 5.3 <5.7 %    Comment:   For the purpose of screening for the presence of diabetes:   <5.7%       Consistent with the absence of diabetes 5.7-6.4 %   Consistent with increased risk for diabetes (prediabetes) >=6.5 %     Consistent with diabetes   This assay result is consistent with a decreased risk of diabetes.   Currently, no consensus exists regarding use of hemoglobin A1c for diagnosis of diabetes in children.   According to American Diabetes Association (ADA) guidelines, hemoglobin A1c <7.0% represents optimal control in non-pregnant diabetic patients. Different metrics may apply to specific patient populations. Standards of Medical Care in Diabetes (ADA).      Mean Plasma Glucose 105 mg/dL  HIV antibody (with reflex)     Status: None   Collection Time: 12/07/15  8:32 AM  Result Value Ref Range   HIV 1&2 Ab, 4th Generation NONREACTIVE NONREACTIVE    Comment:   HIV-1 antigen and HIV-1/HIV-2 antibodies were not detected.  There is no laboratory evidence of HIV infection.   HIV-1/2  Antibody Diff        Not indicated. HIV-1 RNA, Qual TMA          Not indicated.     PLEASE NOTE: This information has been disclosed to you from records whose confidentiality may be protected by state law. If your state requires such protection, then the state law prohibits you from making any further disclosure of the information without the specific written consent of the person to whom it pertains, or as otherwise permitted by law. A general authorization for the release of medical or other information is NOT sufficient for this purpose.   The performance of this assay has not been clinically validated in patients less than 90 years old.   For additional information please refer to http://education.questdiagnostics.com/faq/FAQ106.  (This link is being provided for informational/educational purposes only.)

## 2016-01-08 NOTE — Assessment & Plan Note (Addendum)
Patient will need to restart her Lipitor, as cholesterol is poorly controlled. See levels on labs from 10\4\17.  Dietary changes such as low saturated & trans fat and low carb/ ketogenic diets discussed with patient.  Encouraged regular exercise and weight loss when appropriate.   Educational handouts provided at patient's desire.  Restart Lipitor- medication.   Also, risks and benefits of medications discussed with patient, including alternative treatments.   Encouraged patient to read drug information handouts to further educate self about the medicine prior to starting it.   Contact us prior with any Q's/ concerns.

## 2016-01-08 NOTE — Assessment & Plan Note (Signed)
Despite low blood pressure, serum creatinine slightly elevated and GFR just below 60.  Discussed with patient chronic kidney disease-stage III.   Dietary and lifestyle modifications discussed.

## 2016-01-08 NOTE — Assessment & Plan Note (Addendum)
Doing better with the restart of her lexapro- sx stable.

## 2016-01-08 NOTE — Assessment & Plan Note (Addendum)
Patient asymptomatic. TSH levels-stable at 3.18 recently. 12/07/15

## 2016-02-10 ENCOUNTER — Ambulatory Visit (INDEPENDENT_AMBULATORY_CARE_PROVIDER_SITE_OTHER): Payer: Medicare HMO | Admitting: Family Medicine

## 2016-02-10 ENCOUNTER — Encounter: Payer: Self-pay | Admitting: Family Medicine

## 2016-02-10 VITALS — BP 98/65 | HR 76 | Ht 64.0 in | Wt 170.0 lb

## 2016-02-10 DIAGNOSIS — E663 Overweight: Secondary | ICD-10-CM

## 2016-02-10 DIAGNOSIS — R69 Illness, unspecified: Secondary | ICD-10-CM | POA: Diagnosis not present

## 2016-02-10 DIAGNOSIS — R252 Cramp and spasm: Secondary | ICD-10-CM | POA: Diagnosis not present

## 2016-02-10 DIAGNOSIS — E782 Mixed hyperlipidemia: Secondary | ICD-10-CM | POA: Diagnosis not present

## 2016-02-10 DIAGNOSIS — G4700 Insomnia, unspecified: Secondary | ICD-10-CM

## 2016-02-10 DIAGNOSIS — F3289 Other specified depressive episodes: Secondary | ICD-10-CM | POA: Diagnosis not present

## 2016-02-10 MED ORDER — ESCITALOPRAM OXALATE 20 MG PO TABS: 30.0000 mg | ORAL_TABLET | Freq: Every day | ORAL | 1 refills | Status: DC

## 2016-02-10 NOTE — Assessment & Plan Note (Addendum)
  HPI:   Legs cramp only seldomly- stabbing pain in calf muscles.  Once every 2-3 wks.   Drinks only a third of her appropriate water intake per day.    A/P :  Patient knows she needs to drink at least one half of her weight in ounces of water per day.  - will consider elavil, neurontin or in future prn once well hydrated.  Would rather avoid additional meds if possible

## 2016-02-10 NOTE — Progress Notes (Signed)
Impression and Recommendations:    1. Other depression   2. Insomnia, unspecified type   3. Mixed hyperlipidemia   4. Leg cramping   5. Overweight     Hyperlipidemia  History of present illness:  Last office visit we started her back on her Lipitor. She's been doing great on it. No side effects. No muscle aches or increase in her existing joint aches.  A/P :  - Obtain LFTs today  - Continue medications - prudent diet/ and exercise    Leg cramping  HPI:   Legs cramp only seldomly- stabbing pain in calf muscles.  Once every 2-3 wks.   Drinks only a third of her appropriate water intake per day.    A/P :  Patient knows she needs to drink at least one half of her weight in ounces of water per day.  - will consider elavil, neurontin or in future prn once well hydrated.  Would rather avoid additional meds if possible    Depression  History of present illness:    Patient has had a little bit of a difficult time with her mood lately and feeling down especially around the holidays. Having difficulty with sleep a little bit as well. Her mind keeps thinking and thinking and she has a hard time shutting off and going to bed   A/P:   Increase her Lexapro from 20-30 so she will take 1-1/2 tablets every night.   Also add melatonin 3 mg to 9 mg nightly as well.   Exercise is extremely important for stress management and I encouraged her to do 5 days a week at at least a 30 minute brisk walk    Insomnia- due to stress  - sleep hygeine  - melatonin 3-9mg  q hs  - handouts given-  See AVS for details     New Prescriptions   No medications on file    Modified Medications   Modified Medication Previous Medication   ESCITALOPRAM (LEXAPRO) 20 MG TABLET escitalopram (LEXAPRO) 20 MG tablet      Take 1.5 tablets (30 mg total) by mouth at bedtime. Historical med    Take 20 mg by mouth daily. Historical med    Discontinued Medications   NITROGLYCERIN (NITROSTAT) 0.4  MG SL TABLET    Place 1 tablet (0.4 mg total) under the tongue every 5 (five) minutes as needed for chest pain.    The patient was counseled, risk factors were discussed, anticipatory guidance given.  Gross side effects, risk and benefits, and alternatives of medications and treatment plan in general discussed with patient.  Patient is aware that all medications have potential side effects and we are unable to predict every side effect or drug-drug interaction that may occur.   Patient will call with any questions prior to using medication if they have concerns.  Expresses verbal understanding and consents to current therapy and treatment regimen.  No barriers to understanding were identified.  Red flag symptoms and signs discussed in detail.  Patient expressed understanding regarding what to do in case of emergency\urgent symptoms  Return in about 8 weeks (around 04/06/2016) for f/up inc lexapro(from 20->30mg )and add melatonin- mood and sleep.  Please see AVS handed out to patient at the end of our visit for further patient instructions/ counseling done pertaining to today's office visit.    Note: This document was prepared using Dragon voice recognition software and may include unintentional dictation errors.   --------------------------------------------------------------------------------------------------------------------------------------------------------------------------------------------------------------------------------------------    Subjective:  CC:  Chief Complaint  Patient presents with  . Hyperlipidemia    HPI: Frances Hoffman is a 75 y.o. female who presents to [place] at [facility] today for issues as discussed in problem list section.  ---> Last Office visit on October 20, we restarted her Lipitor.   She is here for follow-up after starting that medication and for recheck of her ALT.  No complaints.  She is trying to drink more water.  No change in  diet or activity level really  ---> more depressed than usual due to holidays.- see prob list  ---> leg cramping complaints-   see prob list     Wt Readings from Last 3 Encounters:  02/10/16 170 lb (77.1 kg)  12/23/15 167 lb 14.4 oz (76.2 kg)  11/22/15 168 lb 12.8 oz (76.6 kg)   BP Readings from Last 3 Encounters:  02/10/16 98/65  12/23/15 99/68  11/22/15 95/64   Pulse Readings from Last 3 Encounters:  02/10/16 76  12/23/15 76  11/22/15 73   BMI Readings from Last 3 Encounters:  02/10/16 29.18 kg/m  12/23/15 28.82 kg/m  11/22/15 28.97 kg/m     Patient Care Team    Relationship Specialty Notifications Start End  Mellody Dance, DO PCP - General Family Medicine  08/24/15   Lonn Georgia, PA-C Physician Assistant Cardiology  01/08/16    Comment: 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    Patient Active Problem List   Diagnosis Date Noted  . NEOPLASM, MALIGNANT, BREAST, HX OF 07/01/2008    Priority: High  . Hyperlipidemia 12/12/2007    Priority: High  . Adjustment disorder with mixed anxiety and depressed mood 12/12/2007    Priority: High  . Depression 12/23/2015    Priority: Medium  . Overweight 03/15/2015    Priority: Medium  . Insomnia- due to stress 01/16/2011    Priority: Medium  . Hypothyroidism 12/12/2007    Priority: Medium  . Chronic GERD 12/12/2007    Priority: Medium  . Leg cramping 02/10/2016    Priority: Low  . Asx Hypotension 09/25/2015    Priority: Low  . Osteopenia 11/26/2008    Priority: Low  . 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 Chest pain 10/26/2015  . Fecal incontinence 07/29/2015  . Heme positive stool 07/29/2015  . Leg pain 06/06/2015  . Routine general medical examination at a health care facility 04/24/2015  . Memory loss 09/22/2014  . Exercise intolerance 07/30/2014  . Estrogen deficiency 04/28/2014  . Tachycardia 04/03/2014  . Family history of heart disease 08/27/2013  . Dyspnea on exertion 08/18/2013  .  Positional vertigo 08/18/2013  . Encounter for Medicare annual wellness exam 11/23/2011  . Other screening mammogram 11/23/2011  . Fatigue 08/15/2009  . WEIGHT GAIN 01/15/2008  . ADHESIVE CAPSULITIS 12/12/2007    Past Medical history, Surgical history, Family history, Social history, Allergies and Medications have been entered into the medical record, reviewed and changed as needed.   Allergies:  Allergies  Allergen Reactions  . Ciprofloxacin Other (See Comments)    Unknown (vertigo??)  . Citalopram Hydrobromide Other (See Comments)    Intrusive/ odd thoughts   . Flagyl [Metronidazole] Other (See Comments)    Unknown??  . Oxycodone Other (See Comments)    Headaches    Review of Systems  Constitutional: Negative for chills and fever.  Respiratory: Negative for cough.   Cardiovascular: Negative for chest pain and palpitations.  Gastrointestinal: Negative for abdominal pain, diarrhea, nausea and  vomiting.  Genitourinary: Negative for dysuria and frequency.  Neurological: Negative for dizziness, weakness and headaches.     Objective:   Blood pressure 98/65, pulse 76, height 5\' 4"  (1.626 m), weight 170 lb (77.1 kg). Body mass index is 29.18 kg/m. General: Well Developed, well nourished, appropriate for stated age.  Neuro: Alert and oriented x3, extra-ocular muscles intact, sensation grossly intact.  HEENT: Normocephalic, atraumatic, neck supple   Skin: Warm and dry, no gross rash. Cardiac: Distant heart sounds RRR, S1 S2,  no murmurs rubs or gallops.  Respiratory: Distant but ECTA B/L, Not using accessory muscles, speaking in full sentences-unlabored. Vascular:  Mild b/l lower ext edema- non-pit, cap RF less 2 sec, good peripheral pulses. Psych: No HI/SI, judgement and insight good, Euthymic mood. Flat Affect.

## 2016-02-10 NOTE — Assessment & Plan Note (Addendum)
  History of present illness:  Last office visit we started her back on her Lipitor. She's been doing great on it. No side effects. No muscle aches or increase in her existing joint aches.  A/P :  - Obtain LFTs today  - Continue medications - prudent diet/ and exercise

## 2016-02-10 NOTE — Patient Instructions (Addendum)
If you have insomnia or difficulty sleeping, this information is for you:  -  Please take melatonin 3-9 mg nightly about 45 minutes to half hour before you go to bed.  - Avoid caffeinated beverages after lunch,  no alcoholic beverages,  no eating within 2-3 hours of lying down,  avoid exposure to blue light before bed,  avoid daytime naps, and  needs to maintain a regular sleep schedule- go to sleep and wake up around the same time every night.   - Resolve concerns or worries before entering bedroom:  Discussed relaxation techniques with patient and to keep a journal to write down fears\ worries.  I suggested seeing a counselor for CBT.   - Recommend patient meditate or do deep breathing exercises to help relax.   Incorporate the use of white noise machines or listen to "sleep meditation music", or recordings of guided meditations for sleep from YouTube which are free, such as  "guided meditation for detachment from over thinking"  by Mayford Knife.         Please realize, EXERCISE IS MEDICINE!  -  American Heart Association [facility]) guidelines for exercise : If you are in good health, without any medical conditions, you should engage in 150 minutes of moderate intensity aerobic activity per week.  This means you should be huffing and puffing throughout your workout.   Engaging in regular exercise will improve brain function and memory, as well as improve mood, boost immune system and help with weight management.  As well as the other, more well-known effects of exercise such as decreasing blood sugar levels, decreasing blood pressure,  and decreasing bad cholesterol levels/ increasing good cholesterol levels.     -  The AHA strongly endorses consumption of a diet that contains a variety of foods from all the food categories with an emphasis on fruits and vegetables; fat-free and low-fat dairy products; cereal and grain products; legumes and nuts; and fish, poultry, and/or extra lean meats.     Excessive food intake, especially of foods high in saturated and trans fats, sugar, and salt, should be avoided.      Adequate water intake of roughly 1/2 of your weight in pounds, should equal the ounces of water per day you should drink.  So for instance, if you're 200 pounds, that would be 100 ounces of water per day.         Mediterranean Diet  Why follow it? Research shows. . Those who follow the Mediterranean diet have a reduced risk of heart disease  . The diet is associated with a reduced incidence of Parkinson's and Alzheimer's diseases . People following the diet may have longer life expectancies and lower rates of chronic diseases  . The Dietary Guidelines for Americans recommends the Mediterranean diet as an eating plan to promote health and prevent disease  What Is the Mediterranean Diet?  . Healthy eating plan based on typical foods and recipes of Mediterranean-style cooking . The diet is primarily a plant based diet; these foods should make up a majority of meals   Starches - Plant based foods should make up a majority of meals - They are an important sources of vitamins, minerals, energy, antioxidants, and fiber - Choose whole grains, foods high in fiber and minimally processed items  - Typical grain sources include wheat, oats, barley, corn, brown rice, bulgar, farro, millet, polenta, couscous  - Various types of beans include chickpeas, lentils, fava beans, black beans, white beans   Fruits  Veggies - Large quantities of antioxidant rich fruits & veggies; 6 or more servings  - Vegetables can be eaten raw or lightly drizzled with oil and cooked  - Vegetables common to the traditional Mediterranean Diet include: artichokes, arugula, beets, broccoli, brussel sprouts, cabbage, carrots, celery, collard greens, cucumbers, eggplant, kale, leeks, lemons, lettuce, mushrooms, okra, onions, peas, peppers, potatoes, pumpkin, radishes, rutabaga, shallots, spinach, sweet potatoes,  turnips, zucchini - Fruits common to the Mediterranean Diet include: apples, apricots, avocados, cherries, clementines, dates, figs, grapefruits, grapes, melons, nectarines, oranges, peaches, pears, pomegranates, strawberries, tangerines  Fats - Replace butter and margarine with healthy oils, such as olive oil, canola oil, and tahini  - Limit nuts to no more than a handful a day  - Nuts include walnuts, almonds, pecans, pistachios, pine nuts  - Limit or avoid candied, honey roasted or heavily salted nuts - Olives are central to the [employer] - can be eaten whole or used in a variety of dishes   Meats Protein - Limiting red meat: no more than a few times a month - When eating red meat: choose lean cuts and keep the portion to the size of deck of cards - Eggs: approx. 0 to 4 times a week  - Fish and lean poultry: at least 2 a week  - Healthy protein sources include, chicken, [place], lean beef, lamb - Increase intake of seafood such as tuna, salmon, trout, mackerel, shrimp, scallops - Avoid or limit high fat processed meats such as sausage and bacon  Dairy - Include moderate amounts of low fat dairy products  - Focus on healthy dairy such as fat free yogurt, skim milk, low or reduced fat cheese - Limit dairy products higher in fat such as whole or 2% milk, cheese, ice cream  Alcohol - Moderate amounts of red wine is ok  - No more than 5 oz daily for women (all ages) and men older than age 50  - No more than 10 oz of wine daily for men younger than 42  Other - Limit sweets and other desserts  - Use herbs and spices instead of salt to flavor foods  - Herbs and spices common to the traditional Mediterranean Diet include: basil, bay leaves, chives, cloves, cumin, fennel, garlic, lavender, marjoram, mint, oregano, parsley, pepper, rosemary, sage, savory, sumac, tarragon, thyme   It's not just a diet, it's a lifestyle:  . The Mediterranean diet includes lifestyle factors typical of those in  the region  . Foods, drinks and meals are best eaten with others and savored . Daily physical activity is important for overall good health . This could be strenuous exercise like running and aerobics . This could also be more leisurely activities such as walking, housework, yard-work, or taking the stairs . Moderation is the key; a balanced and healthy diet accommodates most foods and drinks . Consider portion sizes and frequency of consumption of certain foods   Meal Ideas & Options:  . Breakfast:  o Whole wheat toast or whole wheat English muffins with peanut butter & hard boiled egg o Steel cut oats topped with apples & cinnamon and skim milk  o Fresh fruit: banana, strawberries, melon, berries, peaches  o Smoothies: strawberries, bananas, greek yogurt, peanut butter o Low fat greek yogurt with blueberries and granola  o Egg white omelet with spinach and mushrooms o Breakfast couscous: whole wheat couscous, apricots, skim milk, cranberries  . Sandwiches:  o Hummus and grilled vegetables (peppers, zucchini, squash) on whole wheat  bread   o Grilled chicken on whole wheat pita with lettuce, tomatoes, cucumbers or tzatziki  o Tuna salad on whole wheat bread: tuna salad made with greek yogurt, olives, red peppers, capers, green onions o Garlic rosemary lamb pita: lamb sauted with garlic, rosemary, salt & pepper; add lettuce, cucumber, greek yogurt to pita - flavor with lemon juice and black pepper  . Seafood:  o Mediterranean grilled salmon, seasoned with garlic, basil, parsley, lemon juice and black pepper o Shrimp, lemon, and spinach whole-grain pasta salad made with low fat greek yogurt  o Seared scallops with lemon orzo  o Seared tuna steaks seasoned salt, pepper, coriander topped with tomato mixture of olives, tomatoes, olive oil, minced garlic, parsley, green onions and cappers  . Meats:  o Herbed greek chicken salad with kalamata olives, cucumber, feta  o Red bell peppers stuffed  with spinach, bulgur, lean ground beef (or lentils) & topped with feta   o Kebabs: skewers of chicken, tomatoes, onions, zucchini, squash  o [place] burgers: made with red onions, mint, dill, lemon juice, feta cheese topped with roasted red peppers . Vegetarian o Cucumber salad: cucumbers, artichoke hearts, celery, red onion, feta cheese, tossed in olive oil & lemon juice  o Hummus and whole grain pita points with a greek salad (lettuce, tomato, feta, olives, cucumbers, red onion) o Lentil soup with celery, carrots made with vegetable broth, garlic, salt and pepper  o Tabouli salad: parsley, bulgur, mint, scallions, cucumbers, tomato, radishes, lemon juice, olive oil, salt and pepper.

## 2016-02-10 NOTE — Assessment & Plan Note (Addendum)
  History of present illness:    Patient has had a little bit of a difficult time with her mood lately and feeling down especially around the holidays. Having difficulty with sleep a little bit as well. Her mind keeps thinking and thinking and she has a hard time shutting off and going to bed   A/P:   Increase her Lexapro from 20-30 so she will take 1-1/2 tablets every night.   Also add melatonin 3 mg to 9 mg nightly as well.   Exercise is extremely important for stress management and I encouraged her to do 5 days a week at at least a 30 minute brisk walk

## 2016-02-11 LAB — COMPREHENSIVE METABOLIC PANEL
ALBUMIN: 4.1 g/dL (ref 3.6–5.1)
ALK PHOS: 89 U/L (ref 33–130)
ALT: 15 U/L (ref 6–29)
AST: 23 U/L (ref 10–35)
BILIRUBIN TOTAL: 0.7 mg/dL (ref 0.2–1.2)
BUN: 12 mg/dL (ref 7–25)
CO2: 25 mmol/L (ref 20–31)
CREATININE: 0.98 mg/dL — AB (ref 0.60–0.93)
Calcium: 9.6 mg/dL (ref 8.6–10.4)
Chloride: 102 mmol/L (ref 98–110)
Glucose, Bld: 75 mg/dL (ref 65–99)
Potassium: 5.1 mmol/L (ref 3.5–5.3)
SODIUM: 139 mmol/L (ref 135–146)
TOTAL PROTEIN: 6.9 g/dL (ref 6.1–8.1)

## 2016-02-24 ENCOUNTER — Other Ambulatory Visit: Payer: Self-pay | Admitting: Family Medicine

## 2016-02-24 DIAGNOSIS — Z1231 Encounter for screening mammogram for malignant neoplasm of breast: Secondary | ICD-10-CM

## 2016-03-11 NOTE — Assessment & Plan Note (Addendum)
-   sleep hygeine  - melatonin 3-9mg  q hs  - handouts given-  See AVS for details

## 2016-03-15 ENCOUNTER — Encounter: Payer: Self-pay | Admitting: Family Medicine

## 2016-03-15 ENCOUNTER — Ambulatory Visit (INDEPENDENT_AMBULATORY_CARE_PROVIDER_SITE_OTHER): Payer: Medicare HMO | Admitting: Family Medicine

## 2016-03-15 VITALS — BP 105/67 | HR 87 | Resp 17 | Wt 176.0 lb

## 2016-03-15 DIAGNOSIS — E663 Overweight: Secondary | ICD-10-CM

## 2016-03-15 DIAGNOSIS — K219 Gastro-esophageal reflux disease without esophagitis: Secondary | ICD-10-CM

## 2016-03-15 DIAGNOSIS — R69 Illness, unspecified: Secondary | ICD-10-CM | POA: Diagnosis not present

## 2016-03-15 DIAGNOSIS — F3289 Other specified depressive episodes: Secondary | ICD-10-CM

## 2016-03-15 DIAGNOSIS — F4323 Adjustment disorder with mixed anxiety and depressed mood: Secondary | ICD-10-CM

## 2016-03-15 DIAGNOSIS — G4700 Insomnia, unspecified: Secondary | ICD-10-CM

## 2016-03-15 DIAGNOSIS — E039 Hypothyroidism, unspecified: Secondary | ICD-10-CM | POA: Diagnosis not present

## 2016-03-15 DIAGNOSIS — E782 Mixed hyperlipidemia: Secondary | ICD-10-CM | POA: Diagnosis not present

## 2016-03-15 DIAGNOSIS — E669 Obesity, unspecified: Secondary | ICD-10-CM | POA: Diagnosis not present

## 2016-03-15 MED ORDER — LEVOTHYROXINE SODIUM 75 MCG PO TABS: 75.0000 ug | ORAL_TABLET | Freq: Every day | ORAL | 1 refills | Status: DC

## 2016-03-15 MED ORDER — OMEPRAZOLE MAGNESIUM 20 MG PO TBEC: 20.0000 mg | DELAYED_RELEASE_TABLET | Freq: Every day | ORAL | 1 refills | Status: DC

## 2016-03-15 MED ORDER — ATORVASTATIN CALCIUM 20 MG PO TABS: 20.0000 mg | ORAL_TABLET | Freq: Every day | ORAL | 3 refills | Status: DC

## 2016-03-15 MED ORDER — ESCITALOPRAM OXALATE 20 MG PO TABS: 20.0000 mg | ORAL_TABLET | Freq: Every day | ORAL | 1 refills | Status: DC

## 2016-03-15 NOTE — Assessment & Plan Note (Addendum)
Continue melatonin 3-5mg  nitely when necessary. This is working well for patient. Sleep hygiene discussed.

## 2016-03-15 NOTE — Assessment & Plan Note (Addendum)
Continue current medications, refills given.   Will recheck levels in 3-4 months

## 2016-03-15 NOTE — Progress Notes (Signed)
Impression and Recommendations:    1. Adjustment disorder with mixed anxiety and depressed mood   2. Mixed hyperlipidemia   3. Chronic GERD   4. Other depression   5. Acquired hypothyroidism   6. Insomnia, unspecified type   7. Obesity, Class I, BMI 30-34.9   8. Overweight    Rf's med given- see below   Insomnia- due to stress Continue melatonin 3-5mg  nitely when necessary. This is working well for patient. Sleep hygiene discussed.    Hypothyroidism Continue current medications, refills given.   Will recheck levels in 3-4 months    Chronic GERD Omeprazole daily.   Prescription given and patient will see if it's cheaper to get a prescription or by over-the-counter.    Adjustment disorder with mixed anxiety and depressed mood Lexapro refill given. She will continue at the same dose she has been using. This is been 20 mg daily.    Hyperlipidemia Continues to tolerate Lipitor well. No side effects.  LFTs obtained 6 or so weeks after restart which were normal.  Continue medications-refills given.   Pertinent diet and exercise encouraged.    Depression Doing well, Sx stable  Cont current med dose  Encouraged to exercise 5 d/wk    Obesity, Class I, BMI 30-34.9 Encouraged patient to get back to the [facility] at least 3 days per week.    Did discuss healthy eating habits and not to use food as a emotional support.     Modified Medications   Modified Medication Previous Medication   ATORVASTATIN (LIPITOR) 20 MG TABLET atorvastatin (LIPITOR) 20 MG tablet      Take 1 tablet (20 mg total) by mouth daily at 6 PM.    Take 1 tablet (20 mg total) by mouth daily at 6 PM.   ESCITALOPRAM (LEXAPRO) 20 MG TABLET escitalopram (LEXAPRO) 20 MG tablet      Take 1 tablet (20 mg total) by mouth daily. Historical med    Take 1.5 tablets (30 mg total) by mouth at bedtime. Historical med   LEVOTHYROXINE (SYNTHROID, LEVOTHROID) 75 MCG TABLET levothyroxine (SYNTHROID,  LEVOTHROID) 75 MCG tablet      Take 1 tablet (75 mcg total) by mouth daily.    Take 1 tablet (75 mcg total) by mouth daily.   OMEPRAZOLE (PRILOSEC OTC) 20 MG TABLET omeprazole (PRILOSEC OTC) 20 MG tablet      Take 1 tablet (20 mg total) by mouth daily.    Take 20 mg by mouth at bedtime.    Discontinued Medications   No medications on file    The patient was counseled, risk factors were discussed, anticipatory guidance given.  Gross side effects, risk and benefits, and alternatives of medications and treatment plan in general discussed with patient.  Patient is aware that all medications have potential side effects and we are unable to predict every side effect or drug-drug interaction that may occur.   Patient will call with any questions prior to using medication if they have concerns.  Expresses verbal understanding and consents to current therapy and treatment regimen.  No barriers to understanding were identified.  Red flag symptoms and signs discussed in detail.  Patient expressed understanding regarding what to do in case of emergency\urgent symptoms  Return in about 3 months (around 06/13/2016), or Also f/up 3-4 mo with [facility]-Dr O, for mid april - FLP, TSH, T4, B12- lab only.  Please see AVS handed out to patient at the end of our visit for further patient  instructions/ counseling done pertaining to today's office visit.    Note: This document was prepared using Dragon voice recognition software and may include unintentional dictation errors.   --------------------------------------------------------------------------------------------------------------------------------------------------------------------------------------------------------------------------------------------    Subjective:    CC:  Chief Complaint  Patient presents with  . Anxiety    GAD-7  . Depression    PHQ-9  . Insomnia    Frances Hoffman started the melatonin 3 mg nightly. She reports she is sleeping better and  is not waking in the middle of the night.     HPI: Frances Hoffman is a 76 y.o. female who presents to [place] at [facility] today for issues as discussed below.   gerd- sx well controlled, pt wondering if she can get script cheaper than OTC.    HLD:  Doing well on meds, trying to eat better. Trying to move more.   Hypothyroidism:  Doing well on current dose, no complaints   Insomnia- sleeping better now on just 3 mg melatonin q hs; no middle of nite awakenings   Mood--> depressed mood/ anxiety- well controlled on the lexapro-- doesn't feel anxious or that she needs an adjustment on med dose.    Going to [employer]- once every so often   Depression screen [facility] 2/9  03/15/2016 08/24/2015  Decreased Interest  1 1  Down, Depressed, Hopeless  1 1  PHQ - 2 Score  2 2  Altered sleeping  1 0  Tired, decreased energy  0 1  Change in appetite  0 0  Feeling bad or failure about yourself   1 2  Trouble concentrating  0 3  Moving slowly or fidgety/restless  0 0  Suicidal thoughts  0 0  PHQ-9 Score  4 8    GAD 7 : Generalized Anxiety Score  03/15/2016  Nervous, Anxious, on Edge  1  Control/stop worrying  1  Worry too much - different things  1  Trouble relaxing  0  Restless  0  Easily annoyed or irritable  1  Afraid - awful might happen  1  Total GAD 7 Score  5  Anxiety Difficulty  Not difficult at all    Wt Readings from Last 3 Encounters:  07/12/16 171 lb 12.8 oz (77.9 kg)  03/15/16 176 lb (79.8 kg)  02/10/16 170 lb (77.1 kg)   BP Readings from Last 3 Encounters:  07/12/16 94/70  06/13/16 108/65  03/15/16 105/67   Pulse Readings from Last 3 Encounters:  07/12/16 76  06/13/16 91  03/15/16 87   BMI Readings from Last 3 Encounters:  07/12/16 29.49 kg/m  03/15/16 30.21 kg/m  02/10/16 29.18 kg/m     Patient Care Team    Relationship Specialty Notifications Start End  Mellody Dance, DO PCP - General Family Medicine  08/24/15   Barrett,  Evelene Croon, PA-C Physician Assistant Cardiology  01/08/16    Comment: 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    Patient Active Problem List   Diagnosis Date Noted  . NEOPLASM, MALIGNANT, BREAST, HX OF 07/01/2008    Priority: High  . Hyperlipidemia 12/12/2007    Priority: High  . Adjustment disorder with mixed anxiety and depressed mood 12/12/2007    Priority: High  . Depression 12/23/2015    Priority: Medium  . Overweight 03/15/2015    Priority: Medium  . Insomnia- due to stress 01/16/2011    Priority: Medium  . Hypothyroidism 12/12/2007    Priority: Medium  . Chronic GERD 12/12/2007  Priority: Medium  . Leg cramping 02/10/2016    Priority: Low  . Asx Hypotension 09/25/2015    Priority: Low  . Fatigue 08/15/2009    Priority: Low  . Osteopenia 11/26/2008    Priority: Low  . Obesity, Class I, BMI 30-34.9 08/19/2016  . 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 Chest pain 10/26/2015  . Fecal incontinence 07/29/2015  . Heme positive stool 07/29/2015  . Leg pain 06/06/2015  . Routine general medical examination at a health care facility 04/24/2015  . Memory loss 09/22/2014  . Exercise intolerance 07/30/2014  . Estrogen deficiency 04/28/2014  . Tachycardia 04/03/2014  . Family history of heart disease 08/27/2013  . Dyspnea on exertion 08/18/2013  . Positional vertigo 08/18/2013  . Encounter for Medicare annual wellness exam 11/23/2011  . Other screening mammogram 11/23/2011  . WEIGHT GAIN 01/15/2008  . ADHESIVE CAPSULITIS 12/12/2007    Past Medical history, Surgical history, Family history, Social history, Allergies and Medications have been entered into the medical record, reviewed and changed as needed.   Allergies:  Allergies  Allergen Reactions  . Ciprofloxacin Other (See Comments)    Unknown (vertigo??)  . Citalopram Hydrobromide Other (See Comments)    Intrusive/ odd thoughts   . Flagyl [Metronidazole] Other (See Comments)    Unknown??  . Oxycodone Other  (See Comments)    Headaches    Review of Systems  Constitutional: Negative for chills and fever.  HENT: Negative for congestion and sinus pain.   Eyes: Negative for blurred vision and double vision.  Respiratory: Negative for shortness of breath and wheezing.   Cardiovascular: Negative for chest pain, palpitations and orthopnea.  Gastrointestinal: Negative for constipation, diarrhea, heartburn, nausea and vomiting.  Genitourinary: Negative for frequency and hematuria.  Musculoskeletal: Positive for joint pain. Negative for falls.       Chronic jt pains  Skin: Negative for rash.  Neurological: Negative for dizziness, sensory change and focal weakness.  Endo/Heme/Allergies: Negative for polydipsia.  Psychiatric/Behavioral: Negative for depression and suicidal ideas. The patient is not nervous/anxious and does not have insomnia.      Objective:   Blood pressure 105/67, pulse 87, resp. rate 17, weight 176 lb (79.8 kg), SpO2 97 %. Body mass index is 30.21 kg/m. General: Well Developed, well nourished, appropriate for stated age.  Neuro: Alert and oriented x3, extra-ocular muscles intact, sensation grossly intact.  HEENT: Normocephalic, atraumatic, neck supple, no carotid bruits appreciated  Skin: no gross rash. Cardiac: RRR, S1 S2 Respiratory: ECTA B/L, Not using accessory muscles, speaking in full sentences-unlabored. Vascular:  Ext warm, dry, pink; cap RF less 2 sec. Psych: No HI/SI, judgement and insight good, Euthymic mood. Full Affect.

## 2016-03-15 NOTE — Assessment & Plan Note (Signed)
Omeprazole daily.   Prescription given and patient will see if it's cheaper to get a prescription or by over-the-counter.

## 2016-03-15 NOTE — Patient Instructions (Addendum)
What causes depression?  You may already know that depression (MDD) is a complex medical condition. The exact cause is unknown, but it's most likely a combination of several factors: genetic, biological, environmental, and psychological.  Depression can run in families, but not everyone with depression has a family history. Scientists are trying to identify genes involved to understand the role that genetics may play in depression.  Environmental factors that may play a role in depression include trauma, loss of a loved one, a difficult relationship, or other stressful situations. Some depressive episodes may occur without an obvious trigger.  If you think you may have depression, there is something you can do about it. The first step is to talk to your healthcare professional about your symptoms.  You can also read about the symptoms of depression below to help you get a better understanding about the types of changes you may be experiencing.    Depression and the brain:  It's thought that certain neurotransmitters such as serotonin, norepinephrine, and dopamine (chemicals that the brain uses to communicate) are out of balance when you're depressed  Serotonin is one of the chemicals believed to be affected by depression - it is thought to be involved in the regulation of mood and other functions    What are the symptoms of depression?  Emotional. Physical. Cognitive.  1. Little interest or pleasure in doing things 2. Feeling down, depressed, or hopeless 3. Trouble falling or staying asleep, or sleeping too much 4. Feeling tired or having little energy 5. Poor appetite, overeating, or considerable weight changes 6. Feeling bad about yourself - that you are a failure or having a lot of guilt 7. Difficulty concentrating on things or making decisions 8. Moving or speaking slowly, so that other people have noticed, or being so restless that you've been moving around a lot 9. Thoughts  that you would be better off dead, or of hurting yourself in some way   Depression is a complex medical condition that may make it hard to feel like yourself. Talk to your healthcare professional about all of your symptoms and their impact.   You may be suffering from depression if you are experiencing five or more of the symptoms above including either depressed mood or decreased interest or pleasure. In addition, depression is also associated with:   Symptoms that are new or noticeably worse compared to what they were prior to the episode  Symptoms that persist for most of the day, nearly every day for at least two consecutive weeks  Episodes that are accompanied by clinically significant distress or impaired functioning    Myth:  Depression is something you can just "snap out of".   Fact:  No one chooses to be depressed, and it is not caused by laziness, weakness, or simply feeling sad. Depression is a complex medical condition. The exact cause is unknown, but it's most likely a combination of several factors: genetic, biological, environmental, and psychological. You cannot control your loved one's recovery, but you can support them along the way.   Myth:  Depression only affects your emotions  Fact:  Depression can be emotional, physical, and cognitive symptoms. It can result in symptoms that include little interest or pleasure in doing things; feeling down, depressed, or hopeless; trouble falling or staying asleep, or sleeping too much; feeling tired or having little energy; poor appetite, overeating, or considerable weight changes; feeling bad about yourself, like you are a failure or having a lot of guilt; trouble   concentrating on things or making decisions; moving or speaking slowly, so that other people have noticed, or being so restless that you've been moving around a lot; and thoughts that you would be better off dead, or of hurting yourself in some way. People with  depression do not all experience the same symptoms, and the severity, frequency, and duration can vary  

## 2016-03-15 NOTE — Assessment & Plan Note (Addendum)
Continues to tolerate Lipitor well. No side effects.  LFTs obtained 6 or so weeks after restart which were normal.  Continue medications-refills given.   Pertinent diet and exercise encouraged.

## 2016-03-15 NOTE — Assessment & Plan Note (Addendum)
Lexapro refill given. She will continue at the same dose she has been using. This is been 20 mg daily.

## 2016-04-04 ENCOUNTER — Ambulatory Visit
Admission: RE | Admit: 2016-04-04 | Discharge: 2016-04-04 | Disposition: A | Discharge status: Home / Self Care | Payer: Medicare HMO | Source: Ambulatory Visit | Attending: Family Medicine | Admitting: Family Medicine

## 2016-04-04 DIAGNOSIS — Z1231 Encounter for screening mammogram for malignant neoplasm of breast: Secondary | ICD-10-CM

## 2016-04-19 DIAGNOSIS — K219 Gastro-esophageal reflux disease without esophagitis: Secondary | ICD-10-CM | POA: Diagnosis not present

## 2016-04-19 DIAGNOSIS — Z6829 Body mass index (BMI) 29.0-29.9, adult: Secondary | ICD-10-CM | POA: Diagnosis not present

## 2016-04-19 DIAGNOSIS — Z0000 Encounter for general adult medical examination without abnormal findings: Secondary | ICD-10-CM | POA: Diagnosis not present

## 2016-04-19 DIAGNOSIS — G3184 Mild cognitive impairment, so stated: Secondary | ICD-10-CM | POA: Diagnosis not present

## 2016-04-19 DIAGNOSIS — G4700 Insomnia, unspecified: Secondary | ICD-10-CM | POA: Diagnosis not present

## 2016-04-19 DIAGNOSIS — E785 Hyperlipidemia, unspecified: Secondary | ICD-10-CM | POA: Diagnosis not present

## 2016-04-19 DIAGNOSIS — R69 Illness, unspecified: Secondary | ICD-10-CM | POA: Diagnosis not present

## 2016-04-19 DIAGNOSIS — E039 Hypothyroidism, unspecified: Secondary | ICD-10-CM | POA: Diagnosis not present

## 2016-04-24 ENCOUNTER — Telehealth: Payer: Self-pay | Admitting: Family Medicine

## 2016-04-24 DIAGNOSIS — E039 Hypothyroidism, unspecified: Secondary | ICD-10-CM

## 2016-04-24 DIAGNOSIS — M8580 Other specified disorders of bone density and structure, unspecified site: Secondary | ICD-10-CM

## 2016-04-24 DIAGNOSIS — E782 Mixed hyperlipidemia: Secondary | ICD-10-CM

## 2016-04-24 NOTE — Telephone Encounter (Signed)
-----   Message from Ellamae Sia sent at 04/17/2016  5:08 PM EST ----- Regarding: Lab orders for Wednesday, 2.21.18 Patient is scheduled for CPX labs, please order future labs, Thanks , Karna Christmas

## 2016-04-25 ENCOUNTER — Other Ambulatory Visit: Payer: Medicare HMO

## 2016-05-02 ENCOUNTER — Encounter: Payer: Medicare HMO | Admitting: Family Medicine

## 2016-06-08 ENCOUNTER — Other Ambulatory Visit: Payer: Medicare HMO

## 2016-06-12 ENCOUNTER — Ambulatory Visit: Payer: Medicare HMO | Admitting: Family Medicine

## 2016-06-13 ENCOUNTER — Ambulatory Visit (INDEPENDENT_AMBULATORY_CARE_PROVIDER_SITE_OTHER): Payer: Medicare HMO | Admitting: Adult Health

## 2016-06-13 ENCOUNTER — Other Ambulatory Visit: Payer: Self-pay | Admitting: Adult Health

## 2016-06-13 VITALS — BP 108/65 | HR 91 | Temp 97.4°F

## 2016-06-13 DIAGNOSIS — R300 Dysuria: Secondary | ICD-10-CM | POA: Diagnosis not present

## 2016-06-13 DIAGNOSIS — R8290 Unspecified abnormal findings in urine: Secondary | ICD-10-CM

## 2016-06-13 LAB — POCT URINALYSIS DIPSTICK
BILIRUBIN UA: NEGATIVE
Ketones, UA: NEGATIVE
Nitrite, UA: POSITIVE
Protein, UA: NEGATIVE
UROBILINOGEN UA: 0.2 U/dL
pH, UA: 5 (ref 5.0–8.0)

## 2016-06-13 MED ORDER — NITROFURANTOIN MONOHYD MACRO 100 MG PO CAPS: 100.0000 mg | ORAL_CAPSULE | Freq: Two times a day (BID) | ORAL | 0 refills | Status: DC

## 2016-06-13 NOTE — Progress Notes (Signed)
Pt c/o dysuria.  Denies flank pain, abdominal pain, hematuria, nausea, vomiting, chills or fever.  UA obtained.  [name], CMA  Nitrofurantoin sent in. -Katy d Arvil Persons, NP-C

## 2016-06-17 ENCOUNTER — Other Ambulatory Visit: Payer: Self-pay | Admitting: Family Medicine

## 2016-06-17 LAB — CULTURE, URINE COMPREHENSIVE

## 2016-06-18 ENCOUNTER — Other Ambulatory Visit: Payer: Self-pay | Admitting: Adult Health

## 2016-06-18 DIAGNOSIS — N3001 Acute cystitis with hematuria: Secondary | ICD-10-CM

## 2016-06-18 MED ORDER — SULFAMETHOXAZOLE-TRIMETHOPRIM 800-160 MG PO TABS: 1.0000 | ORAL_TABLET | Freq: Two times a day (BID) | ORAL | 0 refills | Status: DC

## 2016-06-18 NOTE — Progress Notes (Unsigned)
Urine culture 2 x results indicated nitrofurantoin ineffective against organism.   New ABX started

## 2016-07-04 ENCOUNTER — Other Ambulatory Visit: Payer: Self-pay

## 2016-07-04 DIAGNOSIS — E039 Hypothyroidism, unspecified: Secondary | ICD-10-CM

## 2016-07-04 DIAGNOSIS — K591 Functional diarrhea: Secondary | ICD-10-CM | POA: Diagnosis not present

## 2016-07-04 DIAGNOSIS — R413 Other amnesia: Secondary | ICD-10-CM

## 2016-07-04 DIAGNOSIS — M79606 Pain in leg, unspecified: Secondary | ICD-10-CM

## 2016-07-04 DIAGNOSIS — K625 Hemorrhage of anus and rectum: Secondary | ICD-10-CM | POA: Diagnosis not present

## 2016-07-04 DIAGNOSIS — E7849 Other hyperlipidemia: Secondary | ICD-10-CM

## 2016-07-04 LAB — CBC AND DIFFERENTIAL
HCT: 42 % (ref 36–46)
HEMOGLOBIN: 14.1 g/dL (ref 12.0–16.0)
PLATELETS: 278 10*3/uL (ref 150–399)
WBC: 6.6 10*3/mL

## 2016-07-04 LAB — IRON,TIBC AND FERRITIN PANEL
Ferritin: 87.4
IRON SATURATION: 23
Iron Bind.Cap.(Total): 319
Iron: 74
Transferrin: 228

## 2016-07-06 ENCOUNTER — Other Ambulatory Visit (INDEPENDENT_AMBULATORY_CARE_PROVIDER_SITE_OTHER): Payer: Medicare HMO

## 2016-07-06 DIAGNOSIS — E039 Hypothyroidism, unspecified: Secondary | ICD-10-CM

## 2016-07-06 DIAGNOSIS — M79606 Pain in leg, unspecified: Secondary | ICD-10-CM | POA: Diagnosis not present

## 2016-07-06 DIAGNOSIS — E7849 Other hyperlipidemia: Secondary | ICD-10-CM

## 2016-07-06 DIAGNOSIS — E784 Other hyperlipidemia: Secondary | ICD-10-CM

## 2016-07-06 DIAGNOSIS — R413 Other amnesia: Secondary | ICD-10-CM | POA: Diagnosis not present

## 2016-07-07 LAB — LIPID PANEL
CHOLESTEROL TOTAL: 198 mg/dL (ref 100–199)
Chol/HDL Ratio: 4.6 ratio — ABNORMAL HIGH (ref 0.0–4.4)
HDL: 43 mg/dL (ref >39)
LDL Calculated: 116 mg/dL — ABNORMAL HIGH (ref 0–99)
Triglycerides: 197 mg/dL — ABNORMAL HIGH (ref 0–149)
VLDL Cholesterol Cal: 39 mg/dL (ref 5–40)

## 2016-07-07 LAB — TSH: TSH: 4.61 u[IU]/mL — AB (ref 0.450–4.500)

## 2016-07-07 LAB — T4, FREE: Free T4: 1.17 ng/dL (ref 0.82–1.77)

## 2016-07-07 LAB — VITAMIN B12: VITAMIN B 12: 1000 pg/mL (ref 232–1245)

## 2016-07-12 ENCOUNTER — Ambulatory Visit (INDEPENDENT_AMBULATORY_CARE_PROVIDER_SITE_OTHER): Payer: Medicare HMO | Admitting: Family Medicine

## 2016-07-12 ENCOUNTER — Encounter: Payer: Self-pay | Admitting: Family Medicine

## 2016-07-12 VITALS — BP 94/70 | HR 76 | Ht 64.0 in | Wt 171.8 lb

## 2016-07-12 DIAGNOSIS — I959 Hypotension, unspecified: Secondary | ICD-10-CM | POA: Diagnosis not present

## 2016-07-12 DIAGNOSIS — R1030 Lower abdominal pain, unspecified: Secondary | ICD-10-CM | POA: Diagnosis not present

## 2016-07-12 DIAGNOSIS — R109 Unspecified abdominal pain: Secondary | ICD-10-CM

## 2016-07-12 DIAGNOSIS — F3289 Other specified depressive episodes: Secondary | ICD-10-CM | POA: Diagnosis not present

## 2016-07-12 DIAGNOSIS — F4323 Adjustment disorder with mixed anxiety and depressed mood: Secondary | ICD-10-CM

## 2016-07-12 DIAGNOSIS — E784 Other hyperlipidemia: Secondary | ICD-10-CM

## 2016-07-12 DIAGNOSIS — E663 Overweight: Secondary | ICD-10-CM | POA: Diagnosis not present

## 2016-07-12 DIAGNOSIS — K219 Gastro-esophageal reflux disease without esophagitis: Secondary | ICD-10-CM

## 2016-07-12 DIAGNOSIS — E039 Hypothyroidism, unspecified: Secondary | ICD-10-CM | POA: Diagnosis not present

## 2016-07-12 DIAGNOSIS — R69 Illness, unspecified: Secondary | ICD-10-CM | POA: Diagnosis not present

## 2016-07-12 DIAGNOSIS — G4700 Insomnia, unspecified: Secondary | ICD-10-CM | POA: Diagnosis not present

## 2016-07-12 DIAGNOSIS — E7849 Other hyperlipidemia: Secondary | ICD-10-CM

## 2016-07-12 MED ORDER — OMEPRAZOLE MAGNESIUM 20 MG PO TBEC: 20.0000 mg | DELAYED_RELEASE_TABLET | Freq: Every day | ORAL | 3 refills | Status: DC

## 2016-07-12 MED ORDER — LEVOTHYROXINE SODIUM 75 MCG PO TABS: 75.0000 ug | ORAL_TABLET | Freq: Every day | ORAL | 3 refills | Status: DC

## 2016-07-12 MED ORDER — ATORVASTATIN CALCIUM 20 MG PO TABS: 20.0000 mg | ORAL_TABLET | Freq: Every day | ORAL | 3 refills | Status: DC

## 2016-07-12 MED ORDER — ESCITALOPRAM OXALATE 20 MG PO TABS: 20.0000 mg | ORAL_TABLET | Freq: Every day | ORAL | 3 refills | Status: DC

## 2016-07-12 NOTE — Progress Notes (Signed)
Impression and Recommendations:    1. Left sided abdominal pain of unknown cause (in Sub-Q tissue)   2. Other depression   3. Adjustment disorder with mixed anxiety and depressed mood   4. Hypotension, unspecified hypotension type   5. Hypothyroidism, unspecified type   6. Other hyperlipidemia   7. Overweight   8. Insomnia, unspecified type   9. Gastroesophageal reflux disease, esophagitis presence not specified    Left sided abdominal sub-Q tissue pain of unknown cause/ ( dx as L sided pelvic pain in past) - reassurring exam today- no abn appreciated - Pt says pain not that bad and declines pelvic exam today; she also declines abd [place] or any other imaging as well.  - pt tells me if it gets W/ more bothersome, she will let us know and f/up sooner than planned if needed  Depression Doing well, Sx stable  Cont current med dose  Encouraged to exercise 5 d/wk  Hypothyroidism Cont meds  Adjustment disorder with mixed anxiety and depressed mood Cont meds  Exercise/ get involved at [facility] classes etc  Hyperlipidemia Cont meds  Lifestyle modifications in addition to meds d/c pt  Asx Hypotension Remains to be long-standing issue for pt   pt Asx today Push PO water    The patient was counseled, risk factors were discussed, anticipatory guidance given.  Gross side effects, risk and benefits, and alternatives of medications discussed with patient.  Patient is aware that all medications have potential side effects and we are unable to predict every side effect or drug-drug interaction that may occur.  Expresses verbal understanding and consents to current therapy plan and treatment regimen.  Meds ordered this encounter  Medications  . escitalopram (LEXAPRO) 20 MG tablet    Sig: Take 1 tablet (20 mg total) by mouth daily. Historical med    Dispense:  90 tablet    Refill:  3  . omeprazole (PRILOSEC OTC) 20 MG tablet    Sig: Take 1 tablet (20 mg total) by mouth daily.    Dispense:  90 tablet    Refill:  3  . atorvastatin (LIPITOR) 20 MG tablet    Sig: Take 1 tablet (20 mg total) by mouth daily at 6 PM.    Dispense:  90 tablet    Refill:  3  . levothyroxine (SYNTHROID, LEVOTHROID) 75 MCG tablet    Sig: Take 1 tablet (75 mcg total) by mouth daily.    Dispense:  90 tablet    Refill:  3    Please see AVS handed out to patient at the end of our visit for further patient instructions/ counseling done pertaining to today's office visit.    Return for Follow-up of current medical issues.  Note: This document was prepared using Dragon voice recognition software and may include unintentional dictation errors.   ______________________________________________________________________    Subjective:    HPI: Frances Hoffman is a 76 y.o. female who presents to [place] at [facility] today for    mood- doing well on lexapro. Takes daily [place] [place] and doing square dancing.  Pt feels she doesn't need a dose change  Thyroid: feeling well.  Energy level ok.  Trying to walk more, but not consistent   BP:  bp a little low today- no complaints or sx assoc with this; denies use of benadryl or other OTC meds recently.   Not drinking enough water  GERD: doing  well, no S-E. Needs RF; meds work well  CHOL HPI:  -  She  is currently managed with:  See med list from today Txmnt compliance-  good Patient reports very little compliance with low chol/ saturated and trans fat diet.  No exercise No other s-e Last lipid panel as follows:  Lab Results  Component Value Date   CHOL 198 07/06/2016   HDL 43 07/06/2016   LDLCALC 116 (H) 07/06/2016   LDLDIRECT 92.0 04/20/2014   TRIG 197 (H) 07/06/2016   CHOLHDL 4.6 (H) 07/06/2016   Hepatic Function Latest Ref Rng & Units 02/10/2016 12/07/2015 04/25/2015  Total Protein 6.1 - 8.1 g/dL 6.9 6.7 6.9  Albumin 3.6 - 5.1 g/dL 4.1 3.9 4.0  AST 10 - 35 U/L _0 ALT 6 - 29 U/L _1 Alk Phosphatase 33 - 130 U/L 89 83 96  Total Bilirubin 0.2 - 1.2 mg/dL 0.7 0.7 0.5  Bilirubin, Direct 0.0 - 0.3 mg/dL - - -    CC: pain in side--> "in my fat roll" on the L side.  Comes and goes- dull aching pain. Comes on several times per day for sev minutes.  Not W or B with food or empty stomach.  No GI Sx otherwise.  Going on for months/ yrs now--> no W or B.  Felt it initially during an initial female internal exam.  No assoc symptoms. Doesn't go to [facility] anymore- as exams always N and due to age- told her she didn't have to go anymore.      Wt Readings from Last 3 Encounters:  07/12/16 171 lb 12.8 oz (77.9 kg)  03/15/16 176 lb (79.8 kg)  02/10/16 170 lb (77.1 kg)   BP Readings from Last 3 Encounters:  07/12/16 94/70  06/13/16 108/65  03/15/16 105/67   Pulse Readings from Last 3 Encounters:  07/12/16 76  06/13/16 91  03/15/16 87   BMI Readings from Last 3 Encounters:  07/12/16 29.49 kg/m  03/15/16 30.21 kg/m  02/10/16 29.18 kg/m    Lab Results  Component Value Date   HGBA1C 5.3 12/07/2015   HGBA1C 5.6 04/03/2014     Patient Care Team    Relationship Specialty Notifications Start End  Mellody Dance, DO PCP - General Family Medicine  08/24/15   Barrett, Evelene Croon, PA-C Physician Assistant Cardiology  01/08/16    Comment: 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     Active Clinical Problem List Patient Active Problem List   Diagnosis Date Noted  . Hyperlipidemia 12/12/2007    Priority: High  . Adjustment disorder with mixed anxiety and depressed mood 12/12/2007    Priority: High  . Depression 12/23/2015    Priority: Medium  . Overweight 03/15/2015    Priority: Medium  . Insomnia- due to stress 01/16/2011    Priority: Medium  . Hypothyroidism 12/12/2007    Priority: Medium  . Chronic GERD 12/12/2007    Priority: Medium  . Leg cramping 02/10/2016    Priority: Low  . Asx Hypotension 09/25/2015    Priority: Low  . Fatigue 08/15/2009    Priority:  Low  . Osteopenia 11/26/2008    Priority: Low  . Obesity, Class I, BMI 30-34.9 08/19/2016  . 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 Chest pain 10/26/2015  . Fecal incontinence 07/29/2015  . Heme positive stool 07/29/2015  . Leg pain 06/06/2015  . Routine general medical examination at a health care facility 04/24/2015  . Memory  loss 09/22/2014  . Exercise intolerance 07/30/2014  . Estrogen deficiency 04/28/2014  . Tachycardia 04/03/2014  . Family history of heart disease 08/27/2013  . Dyspnea on exertion 08/18/2013  . Positional vertigo 08/18/2013  . Encounter for Medicare annual wellness exam 11/23/2011  . Other screening mammogram 11/23/2011  . Left sided abdominal sub-Q tissue pain of unknown cause/ ( dx as L sided pelvic pain in past) 12/13/2009  . WEIGHT GAIN 01/15/2008  . ADHESIVE CAPSULITIS 12/12/2007     Past medical history:   Reviewed.  See flowsheet/record as well for more information.   Surgical history:    Reviewed.    See flowsheet/record as well for more information.   Family history:   Reviewed.  See flowsheet/record as well for more information.   Social history:    Reviewed. See flowsheet/record as well for more information.   Outpatient Medications reportedly taken by patient at start of today's OV:  Current Meds  Medication Sig  . atorvastatin (LIPITOR) 20 MG tablet Take 1 tablet (20 mg total) by mouth daily at 6 PM.  . b complex vitamins tablet Take 1 tablet by mouth daily.  . calcium carbonate (OS-CAL - DOSED IN MG OF ELEMENTAL CALCIUM) 1250 (500 CA) MG tablet Take 1 tablet by mouth daily.  . carboxymethylcellulose (REFRESH PLUS) 0.5 % SOLN Place 1 drop into both eyes 3 (three) times daily as needed (for dryness).  . Cholecalciferol (VITAMIN D3) 2000 UNITS capsule Take 2,000 Units by mouth daily.    . Cranberry 125 MG TABS Take 2 tablets by mouth.  . escitalopram (LEXAPRO) 20 MG tablet Take 1 tablet (20 mg total) by mouth daily. Historical med  .  levothyroxine (SYNTHROID, LEVOTHROID) 75 MCG tablet Take 1 tablet (75 mcg total) by mouth daily.  . Multiple Vitamin (MULTIVITAMIN) tablet Take 1 tablet by mouth daily.    . Omega-3 Fatty Acids (FISH OIL PO) Take 1 capsule by mouth daily.  [name] omeprazole (PRILOSEC OTC) 20 MG tablet Take 1 tablet (20 mg total) by mouth daily.  . vitamin C (ASCORBIC ACID) 500 MG tablet Take 500 mg by mouth daily.  . [DISCONTINUED] atorvastatin (LIPITOR) 20 MG tablet Take 1 tablet (20 mg total) by mouth daily at 6 PM.  . [DISCONTINUED] escitalopram (LEXAPRO) 20 MG tablet Take 1 tablet (20 mg total) by mouth daily. Historical med  . [DISCONTINUED] levothyroxine (SYNTHROID, LEVOTHROID) 75 MCG tablet Take 1 tablet (75 mcg total) by mouth daily.  . [DISCONTINUED] omeprazole (PRILOSEC OTC) 20 MG tablet Take 1 tablet (20 mg total) by mouth daily.   Current Facility-Administered Medications for the 07/12/16 encounter (Office Visit) with Mellody Dance, DO  Medication  . nitroGLYCERIN (NITROSTAT) SL tablet 0.3 mg    Allergies  Allergen Reactions  . Ciprofloxacin Other (See Comments)    Unknown (vertigo??)  . Citalopram Hydrobromide Other (See Comments)    Intrusive/ odd thoughts   . Flagyl [Metronidazole] Other (See Comments)    Unknown??  . Oxycodone Other (See Comments)    Headaches    Review of Systems: No headache, visual changes, nausea, vomiting, diarrhea, constipation, dizziness, abdominal pain, skin rash, fevers, chills, night sweats, weight loss, swollen lymph nodes, body aches, joint swelling, muscle aches, chest pain, shortness of breath, mood changes, visual or auditory hallucinations.   Objective:  Blood pressure 94/70, pulse 76, height 5' 4" (1.626 m), weight 171 lb 12.8 oz (77.9 kg). Body mass index is 29.49 kg/m.  General: Well Developed, well nourished, and in no acute  distress.  HEENT: Normocephalic, atraumatic Skin: Warm and dry, cap RF less 2 sec, good turgor Chest:  Normal excursion,  shape, no gross abn Respiratory: speaking in full sentences, no conversational dyspnea Abd: + BS * 4 Q, no G, R, R; No OM or masses,  NO lipoma S-Q appreciated NeuroM-Sk: Ambulates w/o assistance, moves * 4 Psych: A and O *3, insight good, mood-full

## 2016-07-12 NOTE — Patient Instructions (Signed)
Guidelines for a Low Cholesterol, Low Saturated Fat Diet   Fats - Limit total intake of fats and oils. - Avoid butter, stick margarine, shortening, lard, palm and coconut oils. - Limit mayonnaise, salad dressings, gravies and sauces, unless they are homemade with low-fat ingredients. - Limit chocolate. - Choose low-fat and nonfat products, such as low-fat mayonnaise, low-fat or non-hydrogenated peanut butter, low-fat or fat-free salad dressings and nonfat gravy. - Use vegetable oil, such as canola or olive oil. - Look for margarine that does not contain trans fatty acids. - Use nuts in moderate amounts. - Read ingredient labels carefully to determine both amount and type of fat present in foods. Limit saturated and trans fats! - Avoid high-fat processed and convenience foods.  Meats and Meat Alternatives - Choose fish, chicken, turkey and lean meats. - Use dried beans, peas, lentils and tofu. - Limit egg yolks to three to four per week. - If you eat red meat, limit to no more than three servings per week and choose loin or round cuts. - Avoid fatty meats, such as bacon, sausage, franks, luncheon meats and ribs. - Avoid all organ meats, including liver.  Dairy - Choose nonfat or low-fat milk, yogurt and cottage cheese. - Most cheeses are high in fat. Choose cheeses made from non-fat milk, such as mozzarella and ricotta cheese. - Choose light or fat-free cream cheese and sour cream. - Avoid cream and sauces made with cream.  Fruits and Vegetables - Eat a wide variety of fruits and vegetables. - Use lemon juice, vinegar or "mist" olive oil on vegetables. - Avoid adding sauces, fat or oil to vegetables.  Breads, Cereals and Grains - Choose whole-grain breads, cereals, pastas and rice. - Avoid high-fat snack foods, such as granola, cookies, pies, pastries, doughnuts and croissants.  Cooking Tips - Avoid deep fried foods. - Trim visible fat off meats and remove skin from poultry  before cooking. - Bake, broil, boil, poach or roast poultry, fish and lean meats. - Drain and discard fat that drains out of meat as you cook it. - Add little or no fat to foods. - Use vegetable oil sprays to grease pans for cooking or baking. - Steam vegetables. - Use herbs or no-oil marinades to flavor foods.  

## 2016-08-19 DIAGNOSIS — E669 Obesity, unspecified: Secondary | ICD-10-CM | POA: Insufficient documentation

## 2016-08-19 NOTE — Assessment & Plan Note (Signed)
Doing well, Sx stable  Cont current med dose  Encouraged to exercise 5 d/wk

## 2016-08-19 NOTE — Assessment & Plan Note (Signed)
Encouraged patient to get back to the [facility] at least 3 days per week.    Did discuss healthy eating habits and not to use food as a emotional support.

## 2016-08-25 NOTE — Assessment & Plan Note (Signed)
Cont meds   

## 2016-08-25 NOTE — Assessment & Plan Note (Signed)
Cont meds  Lifestyle modifications in addition to meds d/c pt

## 2016-08-25 NOTE — Assessment & Plan Note (Signed)
Remains to be long-standing issue for pt   pt Asx today Push PO water

## 2016-08-25 NOTE — Assessment & Plan Note (Signed)
Doing well, Sx stable  Cont current med dose  Encouraged to exercise 5 d/wk

## 2016-08-25 NOTE — Assessment & Plan Note (Signed)
-   reassurring exam today- no abn appreciated - Pt says pain not that bad and declines pelvic exam today; she also declines abd [place] or any other imaging as well.  - pt tells me if it gets W/ more bothersome, she will let us know and f/up sooner than planned if needed

## 2016-08-25 NOTE — Assessment & Plan Note (Signed)
Cont meds  Exercise/ get involved at [facility] classes etc

## 2016-08-31 DIAGNOSIS — H25013 Cortical age-related cataract, bilateral: Secondary | ICD-10-CM | POA: Diagnosis not present

## 2016-08-31 DIAGNOSIS — H04123 Dry eye syndrome of bilateral lacrimal glands: Secondary | ICD-10-CM | POA: Diagnosis not present

## 2016-08-31 DIAGNOSIS — H43813 Vitreous degeneration, bilateral: Secondary | ICD-10-CM | POA: Diagnosis not present

## 2016-08-31 DIAGNOSIS — H2513 Age-related nuclear cataract, bilateral: Secondary | ICD-10-CM | POA: Diagnosis not present

## 2016-10-11 ENCOUNTER — Ambulatory Visit: Payer: Medicare HMO | Admitting: Family Medicine

## 2016-10-17 ENCOUNTER — Ambulatory Visit (INDEPENDENT_AMBULATORY_CARE_PROVIDER_SITE_OTHER): Payer: Medicare HMO | Admitting: Family Medicine

## 2016-10-17 ENCOUNTER — Encounter: Payer: Self-pay | Admitting: Family Medicine

## 2016-10-17 VITALS — BP 109/67 | HR 76 | Ht 64.0 in | Wt 172.3 lb

## 2016-10-17 DIAGNOSIS — M79602 Pain in left arm: Secondary | ICD-10-CM

## 2016-10-17 DIAGNOSIS — F4323 Adjustment disorder with mixed anxiety and depressed mood: Secondary | ICD-10-CM

## 2016-10-17 DIAGNOSIS — H579 Unspecified disorder of eye and adnexa: Secondary | ICD-10-CM

## 2016-10-17 DIAGNOSIS — G4700 Insomnia, unspecified: Secondary | ICD-10-CM

## 2016-10-17 DIAGNOSIS — F3289 Other specified depressive episodes: Secondary | ICD-10-CM

## 2016-10-17 DIAGNOSIS — E669 Obesity, unspecified: Secondary | ICD-10-CM

## 2016-10-17 NOTE — Patient Instructions (Signed)
-   4 your left arm please note when you do activities to make it worse and when you do activities to make it better.  If this is something that gets worse with time please come back so I can reevaluate it as your exam today was completely normal.  - 4 your right eye, I do not see any abnormalities on today's exam.  If the symptoms continue I recommend you get a dilated eye exam with a advanced ophthalmoscope that you could find that your eye doctor's office.   - 4-your weight, I'm very happy that you went to the [facility] so you can try to go more often, also I really recommend going to weight watcher's. Grab your friend and go with her.    - Please call your pastor and see if you can go in for counseling with him to talk about your past and things that are on your mind.  Also please let me know if you like to go to a counselor I can give you a list of people that I would recommend  - If you have insomnia or difficulty sleeping, this information is for you: - Avoid caffeinated beverages after lunch,  no alcoholic beverages,  no eating within 2-3 hours of lying down,  avoid exposure to blue light before bed,  avoid daytime naps, and  needs to maintain a regular sleep schedule- go to sleep and wake up around the same time every night.  - Resolve concerns or worries before entering bedroom:  Discussed relaxation techniques with patient and to keep a journal to write down fears\ worries.  I suggested seeing a counselor for CBT.  - Recommend patient meditate or do deep breathing exercises to help relax.   Incorporate the use of white noise machines or listen to "sleep meditation music", or recordings of guided meditations for sleep from YouTube which are free, such as  "guided meditation for detachment from over thinking"  by Mayford Knife.

## 2016-10-17 NOTE — Progress Notes (Signed)
Impression and Recommendations:    1. right eye complaint   2. Left arm pain   3. Insomnia, unspecified type   4. Other depression   5. Adjustment disorder with mixed anxiety and depressed mood   6. Obesity, Class I, BMI 30-34.9     - 4 your left arm please note when you do activities to make it worse and when you do activities to make it better.  If this is something that gets worse with time please come back so I can reevaluate it as your exam today was completely normal.  - 4 your right eye, I do not see any abnormalities on today's exam.  If the symptoms continue I recommend you get a dilated eye exam with a advanced ophthalmoscope that you could find that your eye doctor's office.   - 4-your weight, I'm very happy that you went to the [facility] so you can try to go more often, also I really recommend going to weight watcher's. Grab your friend and go with her.    - Please call your pastor and see if you can go in for counseling with him to talk about your past and things that are on your mind.  Also please let me know if you like to go to a counselor I can give you a list of people that I would recommend  - Discussed with patient strategies for insomnia to include redirecting her thoughts to something relaxing like sleep meditation or biting her know the sleep music on YouTube etc.  Also discussed with her she can start things are father over and over or rare over and over which can help redirect her thoughts. ---> Pt was in the office today for 40+ minutes, with over 50% time spent in face to face counseling of patients various medical conditions, treatment plans of those medical conditions including medicine management and lifestyle modification, strategies to improve health and well being; and in coordination of care. SEE ABOVE FOR DETAILS  The patient was counseled, risk factors were discussed, anticipatory guidance given.   New Prescriptions   No medications on file      Discontinued Medications   No medications on file     Modified Medications   No medications on file      No orders of the defined types were placed in this encounter.    Gross side effects, risk and benefits, and alternatives of medications and treatment plan in general discussed with patient.  Patient is aware that all medications have potential side effects and we are unable to predict every side effect or drug-drug interaction that may occur.   Patient will call with any questions prior to using medication if they have concerns.  Expresses verbal understanding and consents to current therapy and treatment regimen.  No barriers to understanding were identified.  Red flag symptoms and signs discussed in detail.  Patient expressed understanding regarding what to do in case of emergency\urgent symptoms  Please see AVS handed out to patient at the end of our visit for further patient instructions/ counseling done pertaining to today's office visit.   Return for Medicare wellness visit & yrly screening near future.     Note: This document was prepared using Dragon voice recognition software and may include unintentional dictation errors.  Mellody Dance 1:59 PM --------------------------------------------------------------------------------------------------------------------------------------------------------------------------------------------------------------------------------------------    Subjective:    CC:  Chief Complaint  Patient presents with  . Follow-up  . Arm Pain  left upper arm pain   . Eye Pain    right eye pain x 1-2 days     HPI: Frances Hoffman is a 76 y.o. female who presents to [place] at [facility] today for issues as discussed below.  1)  Patient is concerned about her weight.  She wants to discuss weight loss options for her.  She is gained 4 pounds in the past 1 year.  Patient purchased [employer] weight loss program and  diet supplements and plans on doing that.  2)  Yesterday felt a pain in her R eye- not sure when or what she was doing.  Sharp stabbing pain- lasted seconds. Then spontaneously resolved. Then today around mid-morning R eye felt sore when she looked up.  Sx resolved now but wants it checked out.  Goes to Dr Hecker-->  [facility] in [place].   Last exam was couple months ago.  Cataracts in eyes-->  She follows with them yrly  3)   L arm hurts from time to time.  At least 3-4 mo now. Triceps area, "in flabby fat part."    Feels like:  "just hurts, not sharp , not achy etc.   Comes and goes.   Worse with- nothing;   Better with:  Nothing in particular.   Doesn't;t hurt to lift wts at the [facility]- she goes and exercises 2-3 d/wk now at [employer].   No shoulder or elbow pain.    4)  patient also states is difficult for her to fall asleep sometime in turn off her brain.  She thinks of the past and events that occurred in her life and sometimes this causes excess worry and causes her to stay awake thinking at night.  No problems updated.   Wt Readings from Last 3 Encounters:  10/17/16 172 lb 4.8 oz (78.2 kg)  07/12/16 171 lb 12.8 oz (77.9 kg)  03/15/16 176 lb (79.8 kg)   BP Readings from Last 3 Encounters:  10/17/16 109/67  07/12/16 94/70  06/13/16 108/65   Pulse Readings from Last 3 Encounters:  10/17/16 76  07/12/16 76  06/13/16 91   BMI Readings from Last 3 Encounters:  10/17/16 29.58 kg/m  07/12/16 29.49 kg/m  03/15/16 30.21 kg/m     Patient Care Team    Relationship Specialty Notifications Start End  Mellody Dance, DO PCP - General Family Medicine  08/24/15   Barrett, Evelene Croon, PA-C Physician Assistant Cardiology  01/08/16    Comment: 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     Patient Active Problem List   Diagnosis Date Noted  . Hyperlipidemia 12/12/2007    Priority: High  . Adjustment disorder with mixed anxiety and depressed mood 12/12/2007    Priority: High   . Depression 12/23/2015    Priority: Medium  . Overweight 03/15/2015    Priority: Medium  . Insomnia- due to stress 01/16/2011    Priority: Medium  . Hypothyroidism 12/12/2007    Priority: Medium  . Chronic GERD 12/12/2007    Priority: Medium  . Leg cramping 02/10/2016    Priority: Low  . Asx Hypotension 09/25/2015    Priority: Low  . Fatigue 08/15/2009    Priority: Low  . Osteopenia 11/26/2008    Priority: Low  . Obesity, Class I, BMI 30-34.9 08/19/2016  . 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 Chest pain 10/26/2015  . Fecal incontinence 07/29/2015  . Heme positive stool 07/29/2015  . Leg pain 06/06/2015  .  Routine general medical examination at a health care facility 04/24/2015  . Memory loss 09/22/2014  . Exercise intolerance 07/30/2014  . Estrogen deficiency 04/28/2014  . Tachycardia 04/03/2014  . Family history of heart disease 08/27/2013  . Dyspnea on exertion 08/18/2013  . Positional vertigo 08/18/2013  . Encounter for Medicare annual wellness exam 11/23/2011  . Other screening mammogram 11/23/2011  . Left sided abdominal sub-Q tissue pain of unknown cause/ ( dx as L sided pelvic pain in past) 12/13/2009  . WEIGHT GAIN 01/15/2008  . ADHESIVE CAPSULITIS 12/12/2007    Past Medical history, Surgical history, Family history, Social history, Allergies and Medications have been entered into the medical record, reviewed and changed as needed.    Current Meds  Medication Sig  . atorvastatin (LIPITOR) 20 MG tablet Take 1 tablet (20 mg total) by mouth daily at 6 PM.  . b complex vitamins tablet Take 1 tablet by mouth daily.  . calcium carbonate (OS-CAL - DOSED IN MG OF ELEMENTAL CALCIUM) 1250 (500 CA) MG tablet Take 1 tablet by mouth daily.  . carboxymethylcellulose (REFRESH PLUS) 0.5 % SOLN Place 1 drop into both eyes 3 (three) times daily as needed (for dryness).  . Cholecalciferol (VITAMIN D3) 2000 UNITS capsule Take 2,000 Units by mouth daily.    . Cranberry  125 MG TABS Take 2 tablets by mouth.  . escitalopram (LEXAPRO) 20 MG tablet Take 1 tablet (20 mg total) by mouth daily. Historical med  . levothyroxine (SYNTHROID, LEVOTHROID) 75 MCG tablet Take 1 tablet (75 mcg total) by mouth daily.  . Multiple Vitamin (MULTIVITAMIN) tablet Take 1 tablet by mouth daily.    . Omega-3 Fatty Acids (FISH OIL PO) Take 1 capsule by mouth daily.  [name] omeprazole (PRILOSEC OTC) 20 MG tablet Take 1 tablet (20 mg total) by mouth daily.  . vitamin C (ASCORBIC ACID) 500 MG tablet Take 500 mg by mouth daily.   Current Facility-Administered Medications for the 10/17/16 encounter (Office Visit) with Mellody Dance, DO  Medication  . nitroGLYCERIN (NITROSTAT) SL tablet 0.3 mg    Allergies:  Allergies  Allergen Reactions  . Ciprofloxacin Other (See Comments)    Unknown (vertigo??)  . Citalopram Hydrobromide Other (See Comments)    Intrusive/ odd thoughts   . Flagyl [Metronidazole] Other (See Comments)    Unknown??  . Oxycodone Other (See Comments)    Headaches     Review of Systems: General:   Denies fever, chills, unexplained weight loss.  Optho/Auditory:   Denies visual changes, blurred vision/LOV Respiratory:   Denies wheeze, DOE more than baseline levels.  Cardiovascular:   Denies chest pain, palpitations, new onset peripheral edema  Gastrointestinal:   Denies nausea, vomiting, diarrhea, abd pain.  Genitourinary: Denies dysuria, freq/ urgency, flank pain or discharge from genitals.  Endocrine:     Denies hot or cold intolerance, polyuria, polydipsia. Musculoskeletal:   Denies unexplained myalgias, joint swelling, unexplained arthralgias, gait problems.  Skin:  Denies new onset rash, suspicious lesions Neurological:     Denies dizziness, unexplained weakness, numbness  Psychiatric/Behavioral:   Denies mood changes, suicidal or homicidal ideations, hallucinations    Objective:   Blood pressure 109/67, pulse 76, height 5\' 4"  (1.626 m), weight 172 lb 4.8  oz (78.2 kg). Body mass index is 29.58 kg/m. General:  Well Developed, well nourished, appropriate for stated age.  Neuro:  Alert and oriented,  extra-ocular muscles intact  HEENT:  Normocephalic, atraumatic, neck supple, no carotid bruits appreciated  Skin:  no gross rash,  warm, pink. Cardiac:  RRR, S1 S2 Respiratory:  ECTA B/L and A/P, Not using accessory muscles, speaking in full sentences- unlabored. Vascular:  Ext warm, no cyanosis apprec.; cap RF less 2 sec. Psych:  No HI/SI, judgement and insight good, Euthymic mood. Full Affect.

## 2016-10-22 ENCOUNTER — Ambulatory Visit: Payer: Medicare HMO | Admitting: Family Medicine

## 2016-10-22 DIAGNOSIS — H04123 Dry eye syndrome of bilateral lacrimal glands: Secondary | ICD-10-CM | POA: Diagnosis not present

## 2016-11-06 ENCOUNTER — Ambulatory Visit (INDEPENDENT_AMBULATORY_CARE_PROVIDER_SITE_OTHER): Payer: Medicare HMO | Admitting: Obstetrics & Gynecology

## 2016-11-06 ENCOUNTER — Encounter: Payer: Self-pay | Admitting: Obstetrics & Gynecology

## 2016-11-06 VITALS — BP 100/60 | HR 92 | Resp 18 | Ht 62.5 in | Wt 174.0 lb

## 2016-11-06 DIAGNOSIS — Z23 Encounter for immunization: Secondary | ICD-10-CM

## 2016-11-06 DIAGNOSIS — Z01419 Encounter for gynecological examination (general) (routine) without abnormal findings: Secondary | ICD-10-CM

## 2016-11-06 NOTE — Progress Notes (Signed)
76 y.o. G2P2000 MarriedCaucasianF here for annual exam.  Reports has continued intermittent abdominal pain.  Has chronic constipation.  At times, will have diarrhea as well.  Does better when takes Fibercon.    No LMP recorded. Patient is postmenopausal.          Sexually active: Yes.    The current method of family planning is post menopausal status.    Exercising: Yes.    gym Smoker:  no  Health Maintenance: Pap:  10/17/15 Neg.   History of abnormal Pap:  no MMG:  04/04/16 BIRADS1:neg  Colonoscopy:  03/11/08 normal  BMD:   05/2014 f/u 3 years TDaP:  2008 Pneumonia vaccine(s):  2010, 2016 Zostavax:   2015 Hep C testing: N/A Screening Labs: PCP   reports that she has never smoked. She has never used smokeless tobacco. She reports that she does not drink alcohol or use drugs.  Past Medical History:  Diagnosis Date  . Adjustment disorder with mixed anxiety and depressed mood 12/12/2007   Qualifier: Diagnosis of  By: Copland MD, Frederico Hamman    . Cancer ([place]) 2000   breast had lumpectomy/radiation x36,mammo ,no chemo  . Cataract of both eyes   . Depression   . Dysuria-frequency syndrome    takes AZO  . Frozen shoulder   . Gall stones   . GERD 12/12/2007   Qualifier: Diagnosis of  By: Copland MD, Frederico Hamman    . Hyperlipidemia   . Hypothyroidism 12/12/2007   Qualifier: Diagnosis of  By: Copland MD, Frederico Hamman    . Osteopenia    BMD 2004, WNL 2008  . Pneumonia   . Recurrent UTI   . Shingles    chronic body pain, left side of body  . Shortness of breath dyspnea    when climbing stairs only  . Wears glasses     Past Surgical History:  Procedure Laterality Date  . BREAST LUMPECTOMY Left 2000  . CHOLECYSTECTOMY N/A 02/21/2015   Procedure: LAPAROSCOPIC CHOLECYSTECTOMY WITH INTRAOPERATIVE CHOLANGIOGRAM;  Surgeon: Greer Pickerel, MD;  Location: [facility];  Service: General;  Laterality: N/A;  . COLONOSCOPY    . MULTIPLE TOOTH EXTRACTIONS      Current Outpatient Prescriptions  Medication Sig  Dispense Refill  . atorvastatin (LIPITOR) 20 MG tablet Take 1 tablet (20 mg total) by mouth daily at 6 PM. 90 tablet 3  . b complex vitamins tablet Take 1 tablet by mouth daily.    . calcium carbonate (OS-CAL - DOSED IN MG OF ELEMENTAL CALCIUM) 1250 (500 CA) MG tablet Take 1 tablet by mouth daily.    . carboxymethylcellulose (REFRESH PLUS) 0.5 % SOLN Place 1 drop into both eyes 3 (three) times daily as needed (for dryness).    . Cholecalciferol (VITAMIN D3) 2000 UNITS capsule Take 2,000 Units by mouth daily.      . Cranberry 125 MG TABS Take 2 tablets by mouth.    . escitalopram (LEXAPRO) 20 MG tablet Take 1 tablet (20 mg total) by mouth daily. Historical med 90 tablet 3  . levothyroxine (SYNTHROID, LEVOTHROID) 75 MCG tablet Take 1 tablet (75 mcg total) by mouth daily. 90 tablet 3  . Omega-3 Fatty Acids (FISH OIL PO) Take 1 capsule by mouth daily.    [name] omeprazole (PRILOSEC OTC) 20 MG tablet Take 1 tablet (20 mg total) by mouth daily. 90 tablet 3  . vitamin C (ASCORBIC ACID) 500 MG tablet Take 500 mg by mouth daily.    . Multiple Vitamin (MULTIVITAMIN) tablet Take 1  tablet by mouth daily.       Current Facility-Administered Medications  Medication Dose Route Frequency Provider Last Rate Last Dose  . nitroGLYCERIN (NITROSTAT) SL tablet 0.3 mg  0.3 mg Sublingual Q5 min PRN Mellody Dance, DO   0.3 mg at 10/26/15 1420    Family History  Problem Relation Age of Onset  . Osteoporosis Mother   . Parkinsonism Father   . Cancer Sister        breast CA  . Cancer Other        breast CA  . Coronary artery disease Sister   . Heart disease Maternal Grandmother   . Heart disease Maternal Grandfather     ROS:  Pertinent items are noted in HPI.  Otherwise, a comprehensive ROS was negative.  Exam:   BP 100/60 (BP Location: Right Arm, Patient Position: Sitting, Cuff Size: Large)   Pulse 92   Resp 18   Ht 5' 2.5" (1.588 m)   Wt 174 lb (78.9 kg)   BMI 31.32 kg/m   Weight change: +6#   Height:  5' 2.5" (158.8 cm)  Ht Readings from Last 3 Encounters:  11/06/16 5' 2.5" (1.588 m)  10/17/16 5\' 4"  (1.626 m)  07/12/16 5\' 4"  (1.626 m)    General appearance: alert, cooperative and appears stated age Head: Normocephalic, without obvious abnormality, atraumatic Neck: no adenopathy, supple, symmetrical, trachea midline and thyroid normal to inspection and palpation Lungs: clear to auscultation bilaterally Breasts: normal appearance, no masses or tenderness Heart: regular rate and rhythm Abdomen: soft, non-tender; bowel sounds normal; no masses,  no organomegaly Extremities: extremities normal, atraumatic, no cyanosis or edema Skin: Skin color, texture, turgor normal. No rashes or lesions Lymph nodes: Cervical, supraclavicular, and axillary nodes normal. No abnormal inguinal nodes palpated Neurologic: Grossly normal   Pelvic: External genitalia:  no lesions              Urethra:  normal appearing urethra with no masses, tenderness or lesions              Bartholins and Skenes: normal                 Vagina: normal appearing vagina with normal color and discharge, no lesions              Cervix: no lesions              Pap taken: No. Bimanual Exam:  Uterus:  normal size, contour, position, consistency, mobility, non-tender              Adnexa: normal adnexa and no mass, fullness, tenderness               Rectovaginal: Confirms               Anus:  normal sphincter tone, no lesions  Chaperone was present for exam.  A:  Well Woman with normal exam PMP, no HRT Chronic left abdominal pain persistent now, intremittently, for "years" Chronic constipation Hemorrhoids H/O left breast cancer, s/p lumpectomy and radiation 2000  P:   Mammogram guidelines reviewed Discussed pros/cons of PUS this year.  CT last done 2016.  Pt declined additional imaging this year.  Would like to return for yearly exams, however. pap smear not indicated. Tdap updated today return annually or prn

## 2016-11-14 ENCOUNTER — Other Ambulatory Visit: Payer: Self-pay | Admitting: Family Medicine

## 2016-11-14 ENCOUNTER — Encounter: Payer: Self-pay | Admitting: Family Medicine

## 2016-11-14 ENCOUNTER — Ambulatory Visit (INDEPENDENT_AMBULATORY_CARE_PROVIDER_SITE_OTHER): Payer: Medicare HMO | Admitting: Family Medicine

## 2016-11-14 VITALS — BP 111/69 | HR 86 | Ht 62.5 in | Wt 173.7 lb

## 2016-11-14 DIAGNOSIS — Z853 Personal history of malignant neoplasm of breast: Secondary | ICD-10-CM | POA: Insufficient documentation

## 2016-11-14 DIAGNOSIS — Z13820 Encounter for screening for osteoporosis: Secondary | ICD-10-CM

## 2016-11-14 DIAGNOSIS — Z0000 Encounter for general adult medical examination without abnormal findings: Secondary | ICD-10-CM

## 2016-11-14 DIAGNOSIS — C50912 Malignant neoplasm of unspecified site of left female breast: Secondary | ICD-10-CM

## 2016-11-14 DIAGNOSIS — C50919 Malignant neoplasm of unspecified site of unspecified female breast: Secondary | ICD-10-CM | POA: Insufficient documentation

## 2016-11-14 DIAGNOSIS — M8580 Other specified disorders of bone density and structure, unspecified site: Secondary | ICD-10-CM

## 2016-11-14 NOTE — Progress Notes (Signed)
Patient ID: Frances Hoffman, female   DOB: [birth year], 76 y.o.   MRN: [id number]  Activities of Daily Living In your present state of health, do you have difficulty performing the following activities?  1- Driving - no 2- Managing money - no 3- Feeding yourself - no 4- Getting from the bed to the chair - no 5- Climbing a flight of stairs - no 6- Preparing food and eating - no 7- Bathing or showering - no 8- Getting dressed - no 9- Getting to the toilet - no 10- Using the toilet - no 11- Moving around from place to place - no  6CIT Screen 11/14/2016  What Year? 0 points  What month? 0 points  What time? 0 points  Count back from 20 0 points  Months in reverse 0 points  Repeat phrase 2 points  Total Score 2       Subjective:   Frances Hoffman is a 76 y.o. female who presents for Medicare Annual (Subsequent) preventive examination.  Last bone density was 10\21\13.  Per patient it was in the osteopenic range.  I do not see a T score on that result.  We will send her for repeat bone density scan.  Review of Systems:Review of Systems: General:   Denies fever, chills, unexplained weight loss.  Optho/Auditory:   Denies visual changes, blurred vision/LOV Respiratory:   Denies SOB, DOE more than baseline levels.  Cardiovascular:   Denies chest pain, palpitations, new onset peripheral edema  Gastrointestinal:   Denies nausea, vomiting, diarrhea.  Genitourinary: Denies dysuria, freq/ urgency, flank pain or discharge from genitals.  Endocrine:     Denies hot or cold intolerance, polyuria, polydipsia. Musculoskeletal:   Denies unexplained myalgias, joint swelling, unexplained arthralgias, gait problems.  Skin:  Denies rash, suspicious lesions Neurological:     Denies dizziness, unexplained weakness, numbness  Psychiatric/Behavioral:   Denies mood changes, suicidal or homicidal ideations, hallucinations          Objective:     Vitals: BP 111/69   Pulse 86   Ht 5' 2.5" (1.588  m)   Wt 173 lb 11.2 oz (78.8 kg)   BMI 31.26 kg/m   Body mass index is 31.26 kg/m.   Tobacco History  Smoking Status  . Never Smoker  Smokeless Tobacco  . Never Used     Counseling given: Not Answered   Past Medical History:  Diagnosis Date  . Adjustment disorder with mixed anxiety and depressed mood 12/12/2007   Qualifier: Diagnosis of  By: Copland MD, Karleen Hampshire    . Cancer (HCC) 2000   breast had lumpectomy/radiation x36,mammo ,no chemo  . Cataract of both eyes   . Depression   . Dysuria-frequency syndrome    takes AZO  . Frozen shoulder   . Gall stones   . GERD 12/12/2007   Qualifier: Diagnosis of  By: Copland MD, Karleen Hampshire    . Hyperlipidemia   . Hypothyroidism 12/12/2007   Qualifier: Diagnosis of  By: Copland MD, Karleen Hampshire    . Osteopenia    BMD 2004, WNL 2008  . Pneumonia   . Recurrent UTI   . Shingles    chronic body pain, left side of body  . Shortness of breath dyspnea    when climbing stairs only  . Wears glasses    Past Surgical History:  Procedure Laterality Date  . BREAST LUMPECTOMY Left 2000  . CHOLECYSTECTOMY N/A 02/21/2015   Procedure: LAPAROSCOPIC CHOLECYSTECTOMY WITH INTRAOPERATIVE CHOLANGIOGRAM;  Surgeon: Gaynelle Adu, MD;  Location: [facility] OR;  Service: General;  Laterality: N/A;  . COLONOSCOPY    . MULTIPLE TOOTH EXTRACTIONS     Family History  Problem Relation Age of Onset  . Osteoporosis Mother   . Parkinsonism Father   . Cancer Sister        breast CA  . Cancer Other        breast CA  . Coronary artery disease Sister   . Heart disease Maternal Grandmother   . Heart disease Maternal Grandfather    History  Sexual Activity  . Sexual activity: No    Outpatient Encounter Prescriptions as of 11/14/2016  Medication Sig  . atorvastatin (LIPITOR) 20 MG tablet Take 1 tablet (20 mg total) by mouth daily at 6 PM.  . b complex vitamins tablet Take 1 tablet by mouth daily.  . calcium carbonate (OS-CAL - DOSED IN MG OF ELEMENTAL CALCIUM) 1250 (500  CA) MG tablet Take 1 tablet by mouth daily.  . carboxymethylcellulose (REFRESH PLUS) 0.5 % SOLN Place 1 drop into both eyes 3 (three) times daily as needed (for dryness).  . Cholecalciferol (VITAMIN D3) 2000 UNITS capsule Take 2,000 Units by mouth daily.    . Cranberry 125 MG TABS Take 2 tablets by mouth.  . escitalopram (LEXAPRO) 20 MG tablet Take 1 tablet (20 mg total) by mouth daily. Historical med  . levothyroxine (SYNTHROID, LEVOTHROID) 75 MCG tablet Take 1 tablet (75 mcg total) by mouth daily.  . Multiple Vitamin (MULTIVITAMIN) tablet Take 1 tablet by mouth daily.    . Omega-3 Fatty Acids (FISH OIL PO) Take 1 capsule by mouth daily.  [name] omeprazole (PRILOSEC OTC) 20 MG tablet Take 1 tablet (20 mg total) by mouth daily.  . vitamin C (ASCORBIC ACID) 500 MG tablet Take 500 mg by mouth daily.   Facility-Administered Encounter Medications as of 11/14/2016  Medication  . nitroGLYCERIN (NITROSTAT) SL tablet 0.3 mg    Activities of Daily Living In your present state of health, do you have any difficulty performing the following activities: 11/14/2016  Hearing? N  Vision? N  Difficulty concentrating or making decisions? N  Walking or climbing stairs? N  Dressing or bathing? N  Doing errands, shopping? N  Some recent data might be hidden    Patient Care Team: [name], DO as PCP - General (Family Medicine) Barrett, Joline Salt, PA-C as Physician Assistant (Cardiology) Jerene Bears, MD as Consulting Physician (Gynecology)    Assessment:    *** Exercise Activities and Dietary recommendations    Goals    None     Fall Risk Fall Risk  10/17/2016 08/24/2015 05/02/2015 04/28/2014 02/18/2013  Falls in the past year? No No Yes No No  Number falls in past yr: - - 1 - -  Injury with Fall? - - No - -   Depression Screen PHQ 2/9 Scores 10/17/2016 07/12/2016 03/15/2016 08/24/2015  PHQ - 2 Score 0 2 2 2   PHQ- 9 Score - 6 4 8      Cognitive Function MMSE - Mini Mental State Exam  09/21/2015  Orientation to time 4  Orientation to Place 5  Registration 3  Attention/ Calculation 5  Recall 2  Language- name 2 objects 2  Language- repeat 1  Language- follow 3 step command 3  Language- read & follow direction 1  Write a sentence 1  Copy design 0  Total score 27     6CIT Screen 11/14/2016  What Year? 0 points  What month? 0 points  What time? 0 points  Count back from 20 0 points  Months in reverse 0 points  Repeat phrase 2 points  Total Score 2    Immunization History  Administered Date(s) Administered  . Influenza Split 01/04/2011  . Influenza Whole 01/03/2009, 12/06/2009  . Influenza,inj,Quad PF,6+ Mos 12/02/2012, 11/30/2014, 11/30/2015  . Influenza-Unspecified 12/04/2013  . Pneumococcal Conjugate-13 04/28/2014  . Pneumococcal Polysaccharide-23 11/26/2008  . Td 03/05/2006  . Tdap 11/06/2016  . Zoster 04/05/2013   Screening Tests Health Maintenance  Topic Date Due  . INFLUENZA VACCINE  10/03/2016  . MAMMOGRAM  04/04/2017  . TETANUS/TDAP  11/07/2026  . DEXA SCAN  Completed  . PNA vac Low Risk Adult  Completed      Plan:   ***   I have personally reviewed and noted the following in the patient's chart:   . Medical and social history . Use of alcohol, tobacco or illicit drugs  . Current medications and supplements . Functional ability and status . Nutritional status . Physical activity . Advanced directives . List of other physicians . Hospitalizations, surgeries, and ER visits in previous 12 months . Vitals . Screenings to include cognitive, depression, and falls . Referrals and appointments  In addition, I have reviewed and discussed with patient certain preventive protocols, quality metrics, and best practice recommendations. A written personalized care plan for preventive services as well as general preventive health recommendations were provided to patient.     [name], DO  11/14/2016      Subjective:   Frances Hoffman is a 76 y.o. female who presents for Medicare Annual (Subsequent) preventive examination.  Review of Systems:  ***       Objective:     Vitals: BP 111/69   Pulse 86   Ht 5' 2.5" (1.588 m)   Wt 173 lb 11.2 oz (78.8 kg)   BMI 31.26 kg/m   Body mass index is 31.26 kg/m.   Tobacco History  Smoking Status  . Never Smoker  Smokeless Tobacco  . Never Used     Counseling given: Not Answered   Past Medical History:  Diagnosis Date  . Adjustment disorder with mixed anxiety and depressed mood 12/12/2007   Qualifier: Diagnosis of  By: Copland MD, Karleen Hampshire    . Cancer (HCC) 2000   breast had lumpectomy/radiation x36,mammo ,no chemo  . Cataract of both eyes   . Depression   . Dysuria-frequency syndrome    takes AZO  . Frozen shoulder   . Gall stones   . GERD 12/12/2007   Qualifier: Diagnosis of  By: Copland MD, Karleen Hampshire    . Hyperlipidemia   . Hypothyroidism 12/12/2007   Qualifier: Diagnosis of  By: Copland MD, Karleen Hampshire    . Osteopenia    BMD 2004, WNL 2008  . Pneumonia   . Recurrent UTI   . Shingles    chronic body pain, left side of body  . Shortness of breath dyspnea    when climbing stairs only  . Wears glasses    Past Surgical History:  Procedure Laterality Date  . BREAST LUMPECTOMY Left 2000  . CHOLECYSTECTOMY N/A 02/21/2015   Procedure: LAPAROSCOPIC CHOLECYSTECTOMY WITH INTRAOPERATIVE CHOLANGIOGRAM;  Surgeon: Gaynelle Adu, MD;  Location: [facility] OR;  Service: General;  Laterality: N/A;  . COLONOSCOPY    . MULTIPLE TOOTH EXTRACTIONS     Family History  Problem Relation Age of Onset  . Osteoporosis Mother   . Parkinsonism Father   . Cancer Sister  breast CA  . Cancer Other        breast CA  . Coronary artery disease Sister   . Heart disease Maternal Grandmother   . Heart disease Maternal Grandfather    History  Sexual Activity  . Sexual activity: No    Outpatient Encounter Prescriptions as of 11/14/2016  Medication Sig  . atorvastatin (LIPITOR)  20 MG tablet Take 1 tablet (20 mg total) by mouth daily at 6 PM.  . b complex vitamins tablet Take 1 tablet by mouth daily.  . calcium carbonate (OS-CAL - DOSED IN MG OF ELEMENTAL CALCIUM) 1250 (500 CA) MG tablet Take 1 tablet by mouth daily.  . carboxymethylcellulose (REFRESH PLUS) 0.5 % SOLN Place 1 drop into both eyes 3 (three) times daily as needed (for dryness).  . Cholecalciferol (VITAMIN D3) 2000 UNITS capsule Take 2,000 Units by mouth daily.    . Cranberry 125 MG TABS Take 2 tablets by mouth.  . escitalopram (LEXAPRO) 20 MG tablet Take 1 tablet (20 mg total) by mouth daily. Historical med  . levothyroxine (SYNTHROID, LEVOTHROID) 75 MCG tablet Take 1 tablet (75 mcg total) by mouth daily.  . Multiple Vitamin (MULTIVITAMIN) tablet Take 1 tablet by mouth daily.    . Omega-3 Fatty Acids (FISH OIL PO) Take 1 capsule by mouth daily.  [name] omeprazole (PRILOSEC OTC) 20 MG tablet Take 1 tablet (20 mg total) by mouth daily.  . vitamin C (ASCORBIC ACID) 500 MG tablet Take 500 mg by mouth daily.   Facility-Administered Encounter Medications as of 11/14/2016  Medication  . nitroGLYCERIN (NITROSTAT) SL tablet 0.3 mg    Activities of Daily Living In your present state of health, do you have any difficulty performing the following activities: 11/14/2016  Hearing? N  Vision? N  Difficulty concentrating or making decisions? N  Walking or climbing stairs? N  Dressing or bathing? N  Doing errands, shopping? N  Some recent data might be hidden    Patient Care Team: [name], DO as PCP - General (Family Medicine) Barrett, Joline Salt, PA-C as Physician Assistant (Cardiology) Jerene Bears, MD as Consulting Physician (Gynecology)    Assessment:    *** Exercise Activities and Dietary recommendations    Goals    None     Fall Risk Fall Risk  10/17/2016 08/24/2015 05/02/2015 04/28/2014 02/18/2013  Falls in the past year? No No Yes No No  Number falls in past yr: - - 1 - -  Injury with Fall?  - - No - -   Depression Screen PHQ 2/9 Scores 10/17/2016 07/12/2016 03/15/2016 08/24/2015  PHQ - 2 Score 0 2 2 2   PHQ- 9 Score - 6 4 8      Cognitive Function MMSE - Mini Mental State Exam 09/21/2015  Orientation to time 4  Orientation to Place 5  Registration 3  Attention/ Calculation 5  Recall 2  Language- name 2 objects 2  Language- repeat 1  Language- follow 3 step command 3  Language- read & follow direction 1  Write a sentence 1  Copy design 0  Total score 27     6CIT Screen 11/14/2016  What Year? 0 points  What month? 0 points  What time? 0 points  Count back from 20 0 points  Months in reverse 0 points  Repeat phrase 2 points  Total Score 2    Immunization History  Administered Date(s) Administered  . Influenza Split 01/04/2011  . Influenza Whole 01/03/2009, 12/06/2009  . Influenza,inj,Quad PF,6+ Mos  12/02/2012, 11/30/2014, 11/30/2015  . Influenza-Unspecified 12/04/2013  . Pneumococcal Conjugate-13 04/28/2014  . Pneumococcal Polysaccharide-23 11/26/2008  . Td 03/05/2006  . Tdap 11/06/2016  . Zoster 04/05/2013   Screening Tests Health Maintenance  Topic Date Due  . INFLUENZA VACCINE  10/03/2016  . MAMMOGRAM  04/04/2017  . TETANUS/TDAP  11/07/2026  . DEXA SCAN  Completed  . PNA vac Low Risk Adult  Completed      Plan:   ***   I have personally reviewed and noted the following in the patient's chart:   . Medical and social history . Use of alcohol, tobacco or illicit drugs  . Current medications and supplements . Functional ability and status . Nutritional status . Physical activity . Advanced directives . List of other physicians . Hospitalizations, surgeries, and ER visits in previous 12 months . Vitals . Screenings to include cognitive, depression, and falls . Referrals and appointments  In addition, I have reviewed and discussed with patient certain preventive protocols, quality metrics, and best practice recommendations. A written  personalized care plan for preventive services as well as general preventive health recommendations were provided to patient.     [name], DO  11/14/2016

## 2016-11-14 NOTE — Patient Instructions (Signed)
We can repeat your fasting blood work around early November.  This will be your yearly blood work and then, follow-up office visit with me 1-2 weeks later to discuss chronic issues and go over blood work  - Referred to [facility]  bone density-- make sure that somebody calls you about this in the next week.  If you have not heard, please call Dorothea Ogle and ask about your referral    Preventive Care for Adults A healthy lifestyle and preventive care can promote health and wellness. Preventive health guidelines for men include the following key practices:  .   A routine yearly physical is a good way to check with your health care provider about your health and preventative screening. It is a chance to share any concerns and updates on your health and to receive a thorough exam.  .  Visit your dentist for a routine exam and preventative care every 6 months. Brush your teeth twice a day and floss once a day. Good oral hygiene prevents tooth decay and gum disease.  .  The frequency of eye exams is based on your age, health, family medical history, use of contact lenses, and other factors.  Follow your health care provider's recommendations for frequency of eye exams.  .  Eat a healthy diet.  Foods such as vegetables, fruits, whole grains, low-fat dairy products, and lean protein foods contain the nutrients you need without too many calories.  Decrease your intake of foods high in solid fats, added sugars, and salt.  Eat the right amount of calories for you.  Get information about a proper diet from your health care provider, if necessary.  .  Regular physical exercise is one of the most important things you can do for your health.  Most adults should get at least 150 minutes of moderate-intensity exercise (any activity that increases your heart rate and causes you to sweat) each week.  In addition, most adults need muscle-strengthening exercises on 2 or more days a week.  .  Maintain a healthy  weight. The body mass index (BMI) is a screening tool to identify possible weight problems. It provides an estimate of body fat based on height and weight. Your health care provider can find your BMI and can help you achieve or maintain a healthy weight. For adults 20 years and older: A BMI below 18.5 is considered underweight. A BMI of 18.5 to 24.9 is normal. A BMI of 25 to 29.9 is considered overweight. A BMI of 30 and above is considered obese.  .  Maintain normal blood lipids and cholesterol levels by exercising and minimizing your intake of saturated fat. Eat a balanced diet with plenty of fruit and vegetables. Blood tests for lipids and cholesterol should begin at age 5 and be repeated every 5 years. If your lipid or cholesterol levels are high, you are over 50, or you are at high risk for heart disease, you may need your cholesterol levels checked more frequently. Ongoing high lipid and cholesterol levels should be treated with medicines if diet and exercise are not working.  .  If you smoke, find out from your health care provider how to quit. If you do not use tobacco, do not start.  . If you choose to drink alcohol, do not have more than 2 drinks per day. One drink is considered to be 12 ounces (355 mL) of beer, 5 ounces (148 mL) of wine, or 1.5 ounces (44 mL) of liquor.  [name] Avoid use  of street drugs. Do not share needles with anyone. Ask for help if you need support or instructions about stopping the use of drugs.  . High blood pressure causes heart disease and increases the risk of stroke. Your blood pressure should be checked at least every 1-2 years. Ongoing high blood pressure should be treated with medicines, if weight loss and exercise are not effective.  . If you are 58-66 years old, ask your health care provider if you should take aspirin to prevent heart disease.  . Diabetes screening involves taking a blood sample to check your fasting blood sugar level.  This should be  done once every 3 years, after age 31, if you are within normal weight and without risk factors for diabetes.  Testing should be considered at a younger age or be carried out more frequently if you are overweight and have at least 1 risk factor for diabetes.  . Colorectal cancer can be detected and often prevented. Most routine colorectal cancer screening begins at the age of 50 and continues through age 8. However, your health care provider may recommend screening at an earlier age if you have risk factors for colon cancer. On a yearly basis, your health care provider may provide home test kits to check for hidden blood in the stool. Use of a small camera at the end of a tube to directly examine the colon (sigmoidoscopy or colonoscopy) can detect the earliest forms of colorectal cancer. Talk to your health care provider about this at age 69, when routine screening begins. Direct exam of the colon should be repeated every 5-10 years through age 58, unless early forms of precancerous polyps or small growths are found.  .  Lung cancer screening is recommended for adults aged 14-80 years who are at high risk for developing lung cancer because of a history of smoking. A yearly low-dose CT scan of the lungs is recommended for people who have at least a 30-pack-year history of smoking and are a current smoker or have quit within the past 15 years. A pack year of smoking is smoking an average of 1 pack of cigarettes a day for 1 year (for example: 1 pack a day for 30 years or 2 packs a day for 15 years). Yearly screening should continue until the smoker has stopped smoking for at least 15 years. Yearly screening should be stopped for people who develop a health problem that would prevent them from having lung cancer treatment.  . Talk with your health care provider about prostate cancer screening.  . Testicular cancer screening is recommended for adult males. Screening includes self-exam and a health care  provider exam. Consult with your health care provider about any symptoms you have or any concerns you have about testicular cancer.  . Use sunscreen. Apply sunscreen liberally and repeatedly throughout the day. You should seek shade when your shadow is shorter than you. Protect yourself by wearing long sleeves, pants, a wide-brimmed hat, and sunglasses year round, whenever you are outdoors.  . Once a month, do a whole-body skin exam, using a mirror to look at the skin on your back. Tell your health care provider about new moles, moles that have irregular borders, moles that are larger than a pencil eraser, or moles that have changed in shape or color.    ++++++++++++++++++++++++++++++++++++++++++++++++++++++++++++++++++  Stay current with required vaccines (immunizations).  ? Influenza vaccine. All adults should be immunized every year.  ? Tetanus, diphtheria, and acellular pertussis (Td, Tdap) vaccine.  An adult who has not previously received Tdap or who does not know his vaccine status should receive 1 dose of Tdap. This initial dose should be followed by tetanus and diphtheria toxoids (Td) booster doses every 10 years. Adults with an unknown or incomplete history of completing a 3-dose immunization series with Td-containing vaccines should begin or complete a primary immunization series including a Tdap dose. Adults should receive a Td booster every 10 years.  ? Varicella vaccine. An adult without evidence of immunity to varicella should receive 2 doses or a second dose if he has previously received 1 dose.  ? Human papillomavirus (HPV) vaccine. Males aged 97-21 years who have not received the vaccine previously should receive the 3-dose series. Males aged 22-26 years may be immunized. Immunization is recommended through the age of 109 years for any female who has sex with males and did not get any or all doses earlier. Immunization is recommended for any person with an immunocompromised  condition through the age of 56 years if he did not get any or all doses earlier. During the 3-dose series, the second dose should be obtained 4-8 weeks after the first dose. The third dose should be obtained 24 weeks after the first dose and 16 weeks after the second dose.  ? Zoster vaccine. One dose is recommended for adults aged 38 years or older unless certain conditions are present.   ? PREVNAR - Pneumococcal 13-valent conjugate (PCV13) vaccine. When indicated, a person who is uncertain of his immunization history and has no record of immunization should receive the PCV13 vaccine. An adult aged 20 years or older who has certain medical conditions and has not been previously immunized should receive 1 dose of PCV13 vaccine. This PCV13 should be followed with a dose of pneumococcal polysaccharide (PPSV23) vaccine. The PPSV23 vaccine dose should be obtained at least 8 weeks after the dose of PCV13 vaccine. An adult aged 60 years or older who has certain medical conditions and previously received 1 or more doses of PPSV23 vaccine should receive 1 dose of PCV13. The PCV13 vaccine dose should be obtained 1 or more years after the last PPSV23 vaccine dose.   ? PNEUMOVAX - Pneumococcal polysaccharide (PPSV23) vaccine. When PCV13 is also indicated, PCV13 should be obtained first. All adults aged 4 years and older should be immunized. An adult younger than age 94 years who has certain medical conditions should be immunized. Any person who resides in a nursing home or long-term care facility should be immunized. An adult smoker should be immunized. People with an immunocompromised condition and certain other conditions should receive both PCV13 and PPSV23 vaccines. People with human immunodeficiency virus (HIV) infection should be immunized as soon as possible after diagnosis. Immunization during chemotherapy or radiation therapy should be avoided. Routine use of PPSV23 vaccine is not recommended for [place]  [place]ians, [place], or people younger than 65 years unless there are medical conditions that require PPSV23 vaccine. When indicated, people who have unknown immunization and have no record of immunization should receive PPSV23 vaccine. One-time revaccination 5 years after the first dose of PPSV23 is recommended for people aged 19-64 years who have chronic kidney failure, nephrotic syndrome, asplenia, or immunocompromised conditions. People who received 1-2 doses of PPSV23 before age 9 years should receive another dose of PPSV23 vaccine at age 46 years or later if at least 5 years have passed since the previous dose. Doses of PPSV23 are not needed for people immunized with PPSV23 at or  after age 93 years.   ? Hepatitis A vaccine. Adults who wish to be protected from this disease, have certain high-risk conditions, work with hepatitis A-infected animals, work in hepatitis A research labs, or travel to or work in countries with a high rate of hepatitis A should be immunized. Adults who were previously unvaccinated and who anticipate close contact with an international adoptee during the first 60 days after arrival in the [place] from a country with a high rate of hepatitis A should be immunized.  ? Hepatitis B vaccine. Adults should be immunized if they wish to be protected from this disease, have certain high-risk conditions, may be exposed to blood or other infectious body fluids, are household contacts or sex partners of hepatitis B positive people, are clients or workers in certain care facilities, or travel to or work in countries with a high rate of hepatitis B.     Preventive Service / Frequency  . Ages 51 to 16  Blood pressure check.  Lipid and cholesterol check  Lung cancer screening. / Every year if you are aged 64-80 years and have a 30-pack-year history of smoking and currently smoke or have quit within the past 15 years. Yearly screening is stopped once you have quit  smoking for at least 15 years or develop a health problem that would prevent you from having lung cancer treatment.  Fecal occult blood test (FOBT) of stool. / Every year beginning at age 22 and continuing until age 72. You may not have to do this test if you get a colonoscopy every 10 years.  Flexible sigmoidoscopy** or colonoscopy.** / Every 5 years for a flexible sigmoidoscopy or every 10 years for a colonoscopy beginning at age 39 and continuing until age 79. Screening for abdominal aortic aneurysm (AAA) by ultrasound is recommended for people who have history of high blood pressure or who are current or former smokers.  ++++++++++++++++++++++++++++++++++++++++++++++++++++++++++++++  Recommend Adult Low Dose Aspirin or coated Aspirin 81 mg daily To reduce risk of Colon Cancer 20 % Skin Cancer 26 %  Melanoma 46% and Pancreatic cancer 60%  +++++++++++++++++++++++++++++++++++++++++++++++++++++++++++++  Vitamin D goal is between 50-100. Please make sure that you are taking your Vitamin D as directed.  It is very important as a natural anti-inflammatory - helping with muscle and joint aches; as well as helping hair, skin, and nails; as well as reducing stroke, heart attack and cancer risk. It helps your bones and helps with mood. It also decreases numerous cancer risks so please take it as directed.  - Low Vit D is associated with a 200-300% higher risk for CANCER and 200-300% higher risk for HEART ATTACK & STROKE.  It is also associated with higher death rate at younger ages, autoimmune diseases like Rheumatoid arthritis, Lupus, Multiple Sclerosis; also many other serious conditions, like depression, Alzheimer's Dementia, infertility, muscle aches, fatigue, fibromyalgia - just to name a few.  +++++++++++++++++++++++++++++++++++++++++++++++++++++++++++  Recommend the book "The END of DIETING" by Dr Excell Seltzer & the book "The END of DIABETES " by Dr Excell Seltzer At [email or web address] - get  book & Audio CD's   --->Being diabetic has a 300% increased risk for heart attack, stroke, cancer, and alzheimer- type vascular dementia. It is very important that you work harder with diet by avoiding all foods that are white. Avoid white rice (brown & wild rice is OK), white potatoes (sweet potatoes in moderation is OK), White bread or wheat bread or anything made out of white flour like  bagels, donuts, rolls, buns, biscuits, cakes, pastries, cookies, pizza crust, and pasta (made from white flour & egg whites) - vegetarian pasta or spinach or wheat pasta is OK. Multigrain breads like Arnold's or [employer], or multigrain sandwich thins or flatbreads. Diet, exercise and weight loss can reverse and cure diabetes in the early stages. Diet, exercise and weight loss is very important in the control and prevention of complications of diabetes which affects every system in your body, ie. Brain - dementia/stroke, eyes - glaucoma/blindness, heart - heart attack/heart failure, kidneys - dialysis, stomach - gastric paralysis, intestines - malabsorption, nerves - severe painful neuritis, circulation - gangrene & loss of a leg(s), and finally cancer and Alzheimers.  I recommend avoid fried & greasy foods, sweets/candy, white rice (brown or wild rice or Quinoa is OK), white potatoes (sweet potatoes are OK) - anything made from white flour - bagels, doughnuts, rolls, buns, biscuits,white and wheat breads, pizza crust and traditional pasta made of white flour & egg white(vegetarian pasta or spinach or wheat pasta is OK). Multi-grain bread is OK - like multi-grain flat bread or sandwich thins. Avoid alcohol in excess.  Exercise is also important. Eat all the vegetables you want - avoid fatty meats, especially red meat and dairy - especially cheese. Cheese is the most concentrated form of trans-fats which is the worst thing to clog up our arteries. Veggie cheese is OK which can be found in the fresh produce section at  [employer] or [employer] or [employer].  ++++++++++++++++++++++ DASH Eating Plan  DASH stands for "Dietary Approaches to Stop Hypertension."  The DASH eating plan is a healthy eating plan that has been shown to reduce high blood pressure (hypertension). Additional health benefits may include reducing the risk of type 2 diabetes mellitus, heart disease, and stroke. The DASH eating plan may also help with weight loss.  WHAT DO I NEED TO KNOW ABOUT THE DASH EATING PLAN? For the DASH eating plan, you will follow these general guidelines: . Choose foods with a percent daily value for sodium of less than 5% (as listed on the food label). . Use salt-free seasonings or herbs instead of table salt or sea salt. . Check with your health care provider or pharmacist before using salt substitutes. . Eat lower-sodium products, often labeled as "lower sodium" or "no salt added." . Eat fresh foods. . Eat more vegetables, fruits, and low-fat dairy products. . Choose whole grains. Look for the word "whole" as the first word in the ingredient list. . Choose fish . Limit sweets, desserts, sugars, and sugary drinks. . Choose heart-healthy fats. . Eat veggie cheese . Eat more home-cooked food and less restaurant, buffet, and fast food. . Limit fried foods. Lacinda Axon foods using methods other than frying. . Limit canned vegetables. If you do use them, rinse them well to decrease the sodium. . When eating at a restaurant, ask that your food be prepared with less salt, or no salt if possible.   WHAT FOODS CAN I EAT? Read Dr Fara Olden Fuhrman's books on The End of Dieting & The End of Diabetes  Grains Whole grain or whole wheat bread. Brown rice. Whole grain or whole wheat pasta. Quinoa, bulgur, and whole grain cereals. Low-sodium cereals. Corn or whole wheat flour tortillas. Whole grain cornbread. Whole grain crackers. Low-sodium crackers.  Vegetables Fresh or frozen vegetables (raw, steamed,  roasted, or grilled). Low-sodium or reduced-sodium tomato and vegetable juices. Low-sodium or reduced-sodium tomato sauce and paste. Low-sodium or reduced-sodium canned  vegetables.  Fruits All fresh, canned (in natural juice), or frozen fruits.  Protein Products All fish and seafood. Dried beans, peas, or lentils. Unsalted nuts and seeds. Unsalted canned beans.  Dairy Low-fat dairy products, such as skim or 1% milk, 2% or reduced-fat cheeses, low-fat ricotta or cottage cheese, or plain low-fat yogurt. Low-sodium or reduced-sodium cheeses.  Fats and Oils Tub margarines without trans fats. Light or reduced-fat mayonnaise and salad dressings (reduced sodium). Avocado. Safflower, olive, or canola oils. Natural peanut or almond butter.  Other Unsalted popcorn and pretzels. The items listed above may not be a complete list of recommended foods or beverages. Contact your dietitian for more options.  ++++++++++++++++++++++++++++++++++++++++++++++++++++++++++++++++  WHAT FOODS ARE NOT RECOMMENDED?  Grains/ White flour or wheat flour White bread. White pasta. White rice. Refined cornbread. Bagels and croissants. Crackers that contain trans fat. Vegetables Creamed or fried vegetables. Vegetables in a . Regular canned vegetables. Regular canned tomato sauce and paste. Regular tomato and vegetable juices. Fruits Dried fruits. Canned fruit in light or heavy syrup. Fruit juice. Meat and Other Protein Products Meat in general - RED meat & White meat. Fatty cuts of meat. Ribs, chicken wings, all processed meats as bacon, sausage, bologna, salami, fatback, hot dogs, bratwurst and packaged luncheon meats. Dairy Whole or 2% milk, cream, half-and-half, and cream cheese. Whole-fat or sweetened yogurt. Full-fat cheeses or blue cheese. Non-dairy creamers and whipped toppings. Processed cheese, cheese spreads, or cheese curds.  Condiments Onion and garlic salt, seasoned salt, table salt, and sea salt.  Canned and packaged gravies. Worcestershire sauce. Tartar sauce. Barbecue sauce. Teriyaki sauce. Soy sauce, including reduced sodium. Steak sauce. Fish sauce. Oyster sauce. Cocktail sauce. Horseradish. Ketchup and mustard. Meat flavorings and tenderizers. Bouillon cubes. Hot sauce. Tabasco sauce. Marinades. Taco seasonings. Relishes. Fats and Oils Butter, stick margarine, lard, shortening and bacon fat. Coconut, palm kernel, or palm oils. Regular salad dressings. Pickles and olives. Salted popcorn and pretzels. The items listed above may not be a complete list of foods and beverages to avoid.

## 2016-12-03 ENCOUNTER — Ambulatory Visit
Admission: RE | Admit: 2016-12-03 | Discharge: 2016-12-03 | Disposition: A | Discharge status: Home / Self Care | Payer: Medicare HMO | Source: Ambulatory Visit | Attending: Family Medicine | Admitting: Family Medicine

## 2016-12-03 DIAGNOSIS — Z780 Asymptomatic menopausal state: Secondary | ICD-10-CM | POA: Diagnosis not present

## 2016-12-03 DIAGNOSIS — M8580 Other specified disorders of bone density and structure, unspecified site: Secondary | ICD-10-CM

## 2016-12-03 DIAGNOSIS — M85851 Other specified disorders of bone density and structure, right thigh: Secondary | ICD-10-CM | POA: Diagnosis not present

## 2016-12-17 ENCOUNTER — Other Ambulatory Visit: Payer: Self-pay

## 2016-12-17 DIAGNOSIS — F3289 Other specified depressive episodes: Secondary | ICD-10-CM

## 2016-12-17 DIAGNOSIS — F4323 Adjustment disorder with mixed anxiety and depressed mood: Secondary | ICD-10-CM

## 2016-12-17 MED ORDER — ESCITALOPRAM OXALATE 20 MG PO TABS: 20.0000 mg | ORAL_TABLET | Freq: Every day | ORAL | 3 refills | Status: DC

## 2016-12-19 ENCOUNTER — Telehealth: Payer: Self-pay

## 2016-12-19 NOTE — Telephone Encounter (Signed)
Received Epic notification that pt has not read MyChart message regarding results.     Dr. Raliegh Scarlet asked that I inform you that your recent bone density scan findings are consistent with osteopenia. There is no need for change in your treatment plan.   Please follow up as discussed at your last office visit.   Wishing you well,  Tonya, CMA    Pt informed verbally of results.  [name], CMA

## 2017-01-03 DIAGNOSIS — L820 Inflamed seborrheic keratosis: Secondary | ICD-10-CM | POA: Diagnosis not present

## 2017-01-09 ENCOUNTER — Ambulatory Visit (INDEPENDENT_AMBULATORY_CARE_PROVIDER_SITE_OTHER): Payer: Medicare HMO | Admitting: Family Medicine

## 2017-01-09 ENCOUNTER — Encounter: Payer: Self-pay | Admitting: Family Medicine

## 2017-01-09 VITALS — BP 108/72 | HR 87 | Ht 62.5 in | Wt 175.6 lb

## 2017-01-09 DIAGNOSIS — M1812 Unilateral primary osteoarthritis of first carpometacarpal joint, left hand: Secondary | ICD-10-CM | POA: Diagnosis not present

## 2017-01-09 DIAGNOSIS — F4323 Adjustment disorder with mixed anxiety and depressed mood: Secondary | ICD-10-CM | POA: Diagnosis not present

## 2017-01-09 DIAGNOSIS — K219 Gastro-esophageal reflux disease without esophagitis: Secondary | ICD-10-CM

## 2017-01-09 DIAGNOSIS — E039 Hypothyroidism, unspecified: Secondary | ICD-10-CM | POA: Diagnosis not present

## 2017-01-09 DIAGNOSIS — E7849 Other hyperlipidemia: Secondary | ICD-10-CM | POA: Diagnosis not present

## 2017-01-09 DIAGNOSIS — E669 Obesity, unspecified: Secondary | ICD-10-CM | POA: Diagnosis not present

## 2017-01-09 DIAGNOSIS — R69 Illness, unspecified: Secondary | ICD-10-CM | POA: Diagnosis not present

## 2017-01-09 NOTE — Progress Notes (Signed)
Impression and Recommendations:    1. Primary osteoarthritis of first carpometacarpal joint of left hand   2. Chronic GERD   3. Adjustment disorder with mixed anxiety and depressed mood   4. Obesity, Class I, BMI 30-34.9   5. Other hyperlipidemia   6. Hypothyroidism, unspecified type     There are no diagnoses linked to this encounter.  There are no diagnoses linked to this encounter.  Chronic GERD Continue omeprazole.  Patient takes 1 tablet a day and symptoms are well controlled.  Cannot tolerate without it.    Education and routine counseling performed. Handouts provided.  No orders of the defined types were placed in this encounter.    Gross side effects, risk and benefits, and alternatives of medications and treatment plan in general discussed with patient.  Patient is aware that all medications have potential side effects and we are unable to predict every side effect or drug-drug interaction that may occur.   Patient will call with any questions prior to using medication if they have concerns.  Expresses verbal understanding and consents to current therapy and treatment regimen.  No barriers to understanding were identified.  Red flag symptoms and signs discussed in detail.  Patient expressed understanding regarding what to do in case of emergency\urgent symptoms  Please see AVS handed out to patient at the end of our visit for further patient instructions/ counseling done pertaining to today's office visit.   Return for 3-4 mo- f/up mood, thyroid, exercise, chol.     Note: This document was prepared using Dragon voice recognition software and may include unintentional dictation errors.  Dera Vanaken 11:03  AM --------------------------------------------------------------------------------------------------------------------------------------------------------------------------------------------------------------------------------------------    Subjective:    CC:  Chief Complaint  Patient presents with  . Follow-up    HPI: Frances Hoffman is a 76 y.o. female who presents to [place] at [facility] today for issues as discussed below.  Ex[facility]:  [facility] 45 minutes 3 days/week.  Mood:     Left thumb arthritic pain.  Wearing her night brace but still bothers her.  She rubs it and sometimes puts muscle cream on it but other than that she does nothing.  Problem  Chronic Gerd   Qualifier: Diagnosis of  By: Copland MD, Eugenio Hoes Readings from Last 3 Encounters:  03/26/17 176 lb 11.2 oz (80.2 kg)  01/09/17 175 lb 9.6 oz (79.7 kg)  11/14/16 173 lb 11.2 oz (78.8 kg)   BP Readings from Last 3 Encounters:  03/26/17 115/69  01/09/17 108/72  11/14/16 111/69   Pulse Readings from Last 3 Encounters:  03/26/17 88  01/09/17 87  11/14/16 86   BMI Readings from Last 3 Encounters:  03/26/17 31.80 kg/m  01/09/17 31.61 kg/m  11/14/16 31.26 kg/m     Patient Care Team    Relationship Specialty Notifications Start End  Mellody Dance, DO PCP - General Family Medicine  08/24/15   Barrett, Evelene Croon, PA-C Physician Assistant Cardiology  01/08/16    Comment: 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  Megan Salon, [place] Physician Gynecology  11/14/16      Patient Active Problem List   Diagnosis Date Noted  . Breast cancer ([place])- L breast 2000 11/14/2016    Priority: High  . Hyperlipidemia 12/12/2007    Priority: High  . Adjustment disorder with mixed anxiety and depressed  mood 12/12/2007    Priority: High  . Depression 12/23/2015    Priority: Medium  . Overweight 03/15/2015     Priority: Medium  . Insomnia- due to stress 01/16/2011    Priority: Medium  . Hypothyroidism 12/12/2007    Priority: Medium  . Chronic GERD 12/12/2007    Priority: Medium  . Leg cramping 02/10/2016    Priority: Low  . Asx Hypotension 09/25/2015    Priority: Low  . Fatigue 08/15/2009    Priority: Low  . Osteopenia 11/26/2008    Priority: Low  . Primary osteoarthritis of first carpometacarpal joint of left hand 01/09/2017  . Obesity, Class I, BMI 30-34.9 08/19/2016  . 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 Chest pain 10/26/2015  . Fecal incontinence 07/29/2015  . Heme positive stool 07/29/2015  . Leg pain 06/06/2015  . Routine general medical examination at a health care facility 04/24/2015  . Memory loss 09/22/2014  . Exercise intolerance 07/30/2014  . Estrogen deficiency 04/28/2014  . Tachycardia 04/03/2014  . Family history of heart disease 08/27/2013  . Dyspnea on exertion 08/18/2013  . Positional vertigo 08/18/2013  . Encounter for Medicare annual wellness exam 11/23/2011  . Other screening mammogram 11/23/2011  . Left sided abdominal sub-Q tissue pain of unknown cause/ ( dx as L sided pelvic pain in past) 12/13/2009  . WEIGHT GAIN 01/15/2008  . ADHESIVE CAPSULITIS 12/12/2007    Past Medical history, Surgical history, Family history, Social history, Allergies and Medications have been entered into the medical record, reviewed and changed as needed.    Current Meds  Medication Sig  . atorvastatin (LIPITOR) 20 MG tablet Take 1 tablet (20 mg total) by mouth daily at 6 PM.  . b complex vitamins tablet Take 1 tablet by mouth daily.  . calcium carbonate (OS-CAL - DOSED IN MG OF ELEMENTAL CALCIUM) 1250 (500 CA) MG tablet Take 1 tablet by mouth daily.  . carboxymethylcellulose (REFRESH PLUS) 0.5 % SOLN Place 1 drop into both eyes 3 (three) times daily as needed (for dryness).  . Cholecalciferol (VITAMIN D3) 2000 UNITS capsule Take 2,000 Units by mouth daily.    .  Cranberry 125 MG TABS Take 2 tablets by mouth.  . escitalopram (LEXAPRO) 20 MG tablet Take 1 tablet (20 mg total) by mouth daily. Historical med  . levothyroxine (SYNTHROID, LEVOTHROID) 75 MCG tablet Take 1 tablet (75 mcg total) by mouth daily.  . Multiple Vitamin (MULTIVITAMIN) tablet Take 1 tablet by mouth daily.    . Omega-3 Fatty Acids (FISH OIL PO) Take 1 capsule by mouth daily.  [name] omeprazole (PRILOSEC OTC) 20 MG tablet Take 1 tablet (20 mg total) by mouth daily.  . vitamin C (ASCORBIC ACID) 500 MG tablet Take 500 mg by mouth daily.   Current Facility-Administered Medications for the 01/09/17 encounter (Office Visit) with Mellody Dance, DO  Medication  . nitroGLYCERIN (NITROSTAT) SL tablet 0.3 mg    Allergies:  Allergies  Allergen Reactions  . Ciprofloxacin Other (See Comments)    Unknown (vertigo??)  . Citalopram Hydrobromide Other (See Comments)    Intrusive/ odd thoughts   . Flagyl [Metronidazole] Other (See Comments)    Unknown??  . Oxycodone Other (See Comments)    Headaches     Review of Systems: General:   Denies fever, chills, unexplained weight loss.  Optho/Auditory:   Denies visual changes, blurred vision/LOV Respiratory:   Denies wheeze, DOE more than baseline levels.  Cardiovascular:   Denies chest pain, palpitations, new onset peripheral edema  Gastrointestinal:   Denies nausea, vomiting, diarrhea, abd pain.  Genitourinary: Denies dysuria, freq/ urgency, flank pain or discharge from genitals.  Endocrine:     Denies hot or cold intolerance, polyuria, polydipsia. Musculoskeletal:   Denies unexplained myalgias, joint swelling, unexplained arthralgias, gait problems.  Skin:  Denies new onset rash, suspicious lesions Neurological:     Denies dizziness, unexplained weakness, numbness  Psychiatric/Behavioral:   Denies mood changes, suicidal or homicidal ideations, hallucinations    Objective:   Blood pressure 108/72, pulse 87, height 5' 2.5" (1.588 m), weight  175 lb 9.6 oz (79.7 kg). Body mass index is 31.61 kg/m. General:  Well Developed, well nourished, appropriate for stated age.  Neuro:  Alert and oriented,  extra-ocular muscles intact  HEENT:  Normocephalic, atraumatic, neck supple, no carotid bruits appreciated  Skin:  no gross rash, warm, pink. Cardiac:  RRR, S1 S2 Respiratory:  ECTA B/L and A/P, Not using accessory muscles, speaking in full sentences- unlabored. Vascular:  Ext warm, no cyanosis apprec.; cap RF less 2 sec. Psych:  No HI/SI, judgement and insight good, Euthymic mood. Full Affect.

## 2017-01-09 NOTE — Assessment & Plan Note (Signed)
Continue omeprazole.  Patient takes 1 tablet a day and symptoms are well controlled.  Cannot tolerate without it.

## 2017-01-09 NOTE — Patient Instructions (Addendum)
For your left thumb arthritis pain please use your brace every night.  Otherwise please use ice on the area joint of arthritis.  Do it for 15 minutes 3-4 times per day.  Also use the cream that I recommended to use which is for arthritis  -Or you can look at different hemp cream or Hemp oils which are supposed to help with inflammation and pain as well     -Also we need to transition your brain into thinking more positively.  These tasks below are some things I want you to do every day 1)  write 3 new things that you are grateful for every day for 21 days  2)  exercise daily- walk for 15 minutes twice a day every day 3)  you are going to journal every day about one positive experience that you had 4)  meditate every day.  You can go on YouTube and look for 15-minute relaxation meditation or what ever.  But we need to make sure that you are in the moment and relaxing and deep breathing every day 5)  Write 1 positive email every day to praise someone in your life    - If you have insomnia or difficulty sleeping, this information is for you:  - Avoid caffeinated beverages after lunch,  no alcoholic beverages,  no eating within 2-3 hours of lying down,  avoid exposure to blue light before bed,  avoid daytime naps, and  needs to maintain a regular sleep schedule- go to sleep and wake up around the same time every night.   - Resolve concerns or worries before entering bedroom:  Discussed relaxation techniques with patient and to keep a journal to write down fears\ worries.  I suggested seeing a counselor for CBT.   - Recommend patient meditate or do deep breathing exercises to help relax.   Incorporate the use of white noise machines or listen to "sleep meditation music", or recordings of guided meditations for sleep from YouTube which are free, such as  "guided meditation for detachment from over thinking"  by Mayford Knife.

## 2017-03-08 ENCOUNTER — Other Ambulatory Visit: Payer: Self-pay | Admitting: Family Medicine

## 2017-03-08 DIAGNOSIS — Z1231 Encounter for screening mammogram for malignant neoplasm of breast: Secondary | ICD-10-CM

## 2017-03-26 ENCOUNTER — Encounter: Payer: Self-pay | Admitting: Family Medicine

## 2017-03-26 ENCOUNTER — Ambulatory Visit (INDEPENDENT_AMBULATORY_CARE_PROVIDER_SITE_OTHER): Payer: Medicare HMO | Admitting: Family Medicine

## 2017-03-26 VITALS — BP 115/69 | HR 88 | Ht 62.5 in | Wt 176.7 lb

## 2017-03-26 DIAGNOSIS — F4323 Adjustment disorder with mixed anxiety and depressed mood: Secondary | ICD-10-CM

## 2017-03-26 DIAGNOSIS — R413 Other amnesia: Secondary | ICD-10-CM | POA: Diagnosis not present

## 2017-03-26 DIAGNOSIS — Z820 Family history of epilepsy and other diseases of the nervous system: Secondary | ICD-10-CM | POA: Diagnosis not present

## 2017-03-26 NOTE — Patient Instructions (Addendum)
[email or web address]  is a great website to go to for further information about age related memory loss versus concerning signs of Alzheimer's or early dementia.  Come in for yearly physical exam in the near future along with fasting blood work

## 2017-03-26 NOTE — Progress Notes (Signed)
Impression and Recommendations:    1. Memory difficulties   2. Adjustment disorder with mixed anxiety and depressed mood   3. Family history of Alzheimer's disease     1. Memory difficulties:  Pt's husband came into office with her today and wants her to see a neurology /specialist follow up to screen for alzheimer's or dementia.    Pt agreed to this.   Pt referred to Neurologist.  Handouts and information provided if desired.  Pt also instructed to continue exercising weekly as this will improve memory function.  Handouts provided to patient on AL Z and symptoms of age-related memory loss versus symptoms concerning for dementia  2. Adjustment disorder with mixed anxiety and depressed mood.  Pt's mood is stable.   Denies need for adjustment in medications at this time.  3. FMHx:  alzheimer's disease-  Handouts and information provided if desired.  - Discussed referral to couple's counseling to improve communication between her and her husband.    - Follow up in the near future to schedule FBW and have a yearly complete physical.   Pt will schedule an appointment one week later from that date to discuss lab results.    Orders Placed This Encounter  Procedures  . Ambulatory referral to Neurology    Gross side effects, risk and benefits, and alternatives of medications and treatment plan in general discussed with patient.  Patient is aware that all medications have potential side effects and we are unable to predict every side effect or drug-drug interaction that may occur.   Patient will call with any questions prior to using medication if they have concerns.  Expresses verbal understanding and consents to current therapy and treatment regimen.  No barriers to understanding were identified.  Red flag symptoms and signs discussed in detail.  Patient expressed understanding regarding what to do in case of emergency\urgent symptoms  Please see AVS handed out to patient at the end of our  visit for further patient instructions/ counseling done pertaining to today's office visit.   Return for for yrly medicare wellness and bldwrk near future.    Note: This note was prepared with assistance of Dragon voice recognition software. Occasional wrong-word or sound-a-like substitutions may have occurred due to the inherent limitations of voice recognition software.     This document serves as a record of services personally performed by Mellody Dance, DO. It was created on her behalf by Mayer Masker, a trained medical scribe. The creation of this record is based on the scribe's personal observations and the provider's statements to them.   I have reviewed the above medical documentation for accuracy and completeness and I concur.  Mellody Dance 03/26/17 2:26 PM   --------------------------------------------------------------------------------------------------------------------------------------------------------------------------------------------------------------------------------------------    Subjective:     HPI: Frances Hoffman is a 77 y.o. female who presents to [place] [facility] today for mood changing issues.   Mood: Pt and her husband are concerned about potential alzheimer's developing. She reports a recent dispute with her sister, as well as other recent various mood-adjustment issues that are out of the ordinary. She states she has difficulties remembering books she has read. She is requesting a possibility of a referral to a neurologist. She reports she argues occasionally with her husband. She is otherwise acting normally while around the house. Pt denies any self-care issues. Pt denies h/o head trauma.  Pt has a FMHx: father: parkinsons and dementia, grandfather (paternal): dementia. Per her husband,  she also has a 70 y.o sister who is "developing dementia problems"   MMSE - Mini Mental State Exam 03/26/2017 09/21/2015  Orientation to  time 5 4  Orientation to Place 5 5  Registration 3 3  Attention/ Calculation 2 5  Recall 1 2  Language- name 2 objects 2 2  Language- repeat 1 1  Language- follow 3 step command 3 3  Language- read & follow direction 1 1  Write a sentence 1 1  Copy design 1 0  Total score 25 27     6CIT Screen 03/26/2017 11/14/2016  What Year? 0 points 0 points  What month? 0 points 0 points  What time? 0 points 0 points  Count back from 20 0 points 0 points  Months in reverse 4 points 0 points  Repeat phrase 6 points 2 points  Total Score 10 2    Depression screen [facility] 2/9 03/26/2017 01/09/2017 10/17/2016 07/12/2016 03/15/2016  Decreased Interest 2 1 0 1 1  Down, Depressed, Hopeless 2 1 0 1 1  PHQ - 2 Score 4 2 0 2 2  Altered sleeping 3 1 - 0 1  Tired, decreased energy 2 1 - 1 0  Change in appetite 0 0 - 0 0  Feeling bad or failure about yourself  1 1 - 1 1  Trouble concentrating 1 0 - 2 0  Moving slowly or fidgety/restless 0 1 - 0 0  Suicidal thoughts 0 0 - 0 0  PHQ-9 Score 11 6 - 6 4  Difficult doing work/chores Not difficult at all Somewhat difficult - - -   GAD 7 : Generalized Anxiety Score 07/12/2016 03/15/2016  Nervous, Anxious, on Edge 1 1  Control/stop worrying 2 1  Worry too much - different things 2 1  Trouble relaxing 0 0  Restless 0 0  Easily annoyed or irritable 2 1  Afraid - awful might happen 1 1  Total GAD 7 Score 8 5  Anxiety Difficulty Not difficult at all Not difficult at all      Wt Readings from Last 3 Encounters:  03/26/17 176 lb 11.2 oz (80.2 kg)  01/09/17 175 lb 9.6 oz (79.7 kg)  11/14/16 173 lb 11.2 oz (78.8 kg)   BP Readings from Last 3 Encounters:  03/26/17 115/69  01/09/17 108/72  11/14/16 111/69   Pulse Readings from Last 3 Encounters:  03/26/17 88  01/09/17 87  11/14/16 86   BMI Readings from Last 3 Encounters:  03/26/17 31.80 kg/m  01/09/17 31.61 kg/m  11/14/16 31.26 kg/m     Patient Care Team    Relationship Specialty Notifications  Start End  Mellody Dance, DO PCP - General Family Medicine  08/24/15   Barrett, Evelene Croon, PA-C Physician Assistant Cardiology  01/08/16    Comment: 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  Megan Salon, MD Consulting Physician Gynecology  11/14/16      Patient Active Problem List   Diagnosis Date Noted  . Breast cancer ([place])- L breast 2000 11/14/2016    Priority: High  . Hyperlipidemia 12/12/2007    Priority: High  . Adjustment disorder with mixed anxiety and depressed mood 12/12/2007    Priority: High  . Depression 12/23/2015    Priority: Medium  . Overweight 03/15/2015    Priority: Medium  . Insomnia- due to stress 01/16/2011    Priority: Medium  . Hypothyroidism 12/12/2007    Priority: Medium  . Chronic GERD 12/12/2007    Priority: Medium  .  Leg cramping 02/10/2016    Priority: Low  . Asx Hypotension 09/25/2015    Priority: Low  . Fatigue 08/15/2009    Priority: Low  . Osteopenia 11/26/2008    Priority: Low  . Primary osteoarthritis of first carpometacarpal joint of left hand 01/09/2017  . Obesity, Class I, BMI 30-34.9 08/19/2016  . 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 Chest pain 10/26/2015  . Fecal incontinence 07/29/2015  . Heme positive stool 07/29/2015  . Leg pain 06/06/2015  . Routine general medical examination at a health care facility 04/24/2015  . Memory loss 09/22/2014  . Exercise intolerance 07/30/2014  . Estrogen deficiency 04/28/2014  . Tachycardia 04/03/2014  . Family history of heart disease 08/27/2013  . Dyspnea on exertion 08/18/2013  . Positional vertigo 08/18/2013  . Encounter for Medicare annual wellness exam 11/23/2011  . Other screening mammogram 11/23/2011  . Left sided abdominal sub-Q tissue pain of unknown cause/ ( dx as L sided pelvic pain in past) 12/13/2009  . WEIGHT GAIN 01/15/2008  . ADHESIVE CAPSULITIS 12/12/2007    Past Medical history, Surgical history, Family history, Social history, Allergies and Medications have  been entered into the medical record, reviewed and changed as needed.    Current Meds  Medication Sig  . atorvastatin (LIPITOR) 20 MG tablet Take 1 tablet (20 mg total) by mouth daily at 6 PM.  . b complex vitamins tablet Take 1 tablet by mouth daily.  . calcium carbonate (OS-CAL - DOSED IN MG OF ELEMENTAL CALCIUM) 1250 (500 CA) MG tablet Take 1 tablet by mouth daily.  . carboxymethylcellulose (REFRESH PLUS) 0.5 % SOLN Place 1 drop into both eyes 3 (three) times daily as needed (for dryness).  . Cholecalciferol (VITAMIN D3) 2000 UNITS capsule Take 2,000 Units by mouth daily.    . Cranberry 125 MG TABS Take 2 tablets by mouth.  . escitalopram (LEXAPRO) 20 MG tablet Take 1 tablet (20 mg total) by mouth daily. Historical med  . levothyroxine (SYNTHROID, LEVOTHROID) 75 MCG tablet Take 1 tablet (75 mcg total) by mouth daily.  . Multiple Vitamin (MULTIVITAMIN) tablet Take 1 tablet by mouth daily.    . Omega-3 Fatty Acids (FISH OIL PO) Take 1 capsule by mouth daily.  [name] omeprazole (PRILOSEC OTC) 20 MG tablet Take 1 tablet (20 mg total) by mouth daily.  . vitamin C (ASCORBIC ACID) 500 MG tablet Take 500 mg by mouth daily.   Current Facility-Administered Medications for the 03/26/17 encounter (Office Visit) with Mellody Dance, DO  Medication  . nitroGLYCERIN (NITROSTAT) SL tablet 0.3 mg    Allergies:  Allergies  Allergen Reactions  . Ciprofloxacin Other (See Comments)    Unknown (vertigo??)  . Citalopram Hydrobromide Other (See Comments)    Intrusive/ odd thoughts   . Flagyl [Metronidazole] Other (See Comments)    Unknown??  . Oxycodone Other (See Comments)    Headaches     Review of Systems:  A fourteen system review of systems was performed and found to be positive as per HPI.   Objective:   Blood pressure 115/69, pulse 88, height 5' 2.5" (1.588 m), weight 176 lb 11.2 oz (80.2 kg), SpO2 98 %. Body mass index is 31.8 kg/m. General:  Well Developed, well nourished, appropriate  for stated age.  Neuro:  Alert and oriented,  extra-ocular muscles intact  HEENT:  Normocephalic, atraumatic, neck supple, no carotid bruits appreciated  Skin:  no gross rash, warm, pink. Cardiac:  RRR, S1 S2 Respiratory:  ECTA B/L and A/P, Not  using accessory muscles, speaking in full sentences- unlabored. Vascular:  Ext warm, no cyanosis apprec.; cap RF less 2 sec. Psych:  No HI/SI, judgement and insight good, Euthymic mood. Full Affect.

## 2017-03-28 ENCOUNTER — Ambulatory Visit: Payer: Medicare HMO | Admitting: Neurology

## 2017-03-28 ENCOUNTER — Encounter: Payer: Self-pay | Admitting: Neurology

## 2017-03-28 VITALS — BP 117/77 | HR 101 | Ht 62.5 in | Wt 175.5 lb

## 2017-03-28 DIAGNOSIS — R413 Other amnesia: Secondary | ICD-10-CM

## 2017-03-28 NOTE — Progress Notes (Signed)
PATIENT: Frances Hoffman DOB: June 14, 1940  Chief Complaint  Patient presents with  . Memory Loss    MMSE 28/30 - 8 animals.  She is here with her husband, Frances Hoffman, to have her memory loss evaluated.  [name] PCP    [name], DO     HISTORICAL  Frances Hoffman is a 77 year old female, accompanied by her husband Frances Hoffman, seen in refer by her primary care doctor [name], for evaluation of memory loss, initial evaluation was on January 24th 2019.  Reviewed and summarized the referring note, she has history of  hyperlipidemia, breast cancer, hypothyroidism, on supplement.  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  She does have strong family history of dementia, her father suffered Parkinson's disease, memory loss, 42 year old sister suffer dementia.  Since summer 2018, she was noted to have mild memory loss, but the most bothersome symptoms is her mood swings, and Christmas time 2018, she invited her sister stay with her, and then she decided to break up the relationship, couple days later, she was able to talk with them for hours on the phone again, this has puzzled her husband.  Husband also reported that she tends to be over criticize him, tends to be emotional.  She came back from a social gathering of her high school classmates recently, thinking her friends have ignored her.  Lab in May 2018, B12, normal. TSH was elevated 4.6, LDL 116, triglyceride 197,   REVIEW OF SYSTEMS: Full 14 system review of systems performed and notable only for depression, disinterested in activities, sleepiness, memory loss, feeling hot, joint pain, achy muscles, frequent infection, snoring  ALLERGIES: Allergies  Allergen Reactions  . Ciprofloxacin Other (See Comments)    Unknown (vertigo??)  . Citalopram Hydrobromide Other (See Comments)    Intrusive/ odd thoughts   . Flagyl  [Metronidazole] Other (See Comments)    Unknown??  . Oxycodone Other (See Comments)    Headaches    HOME MEDICATIONS: Current Outpatient Medications  Medication Sig Dispense Refill  . atorvastatin (LIPITOR) 20 MG tablet Take 1 tablet (20 mg total) by mouth daily at 6 PM. 90 tablet 3  . b complex vitamins tablet Take 1 tablet by mouth daily.    . calcium carbonate (OS-CAL - DOSED IN MG OF ELEMENTAL CALCIUM) 1250 (500 CA) MG tablet Take 1 tablet by mouth daily.    . carboxymethylcellulose (REFRESH PLUS) 0.5 % SOLN Place 1 drop into both eyes 3 (three) times daily as needed (for dryness).    . Cholecalciferol (VITAMIN D3) 2000 UNITS capsule Take 2,000 Units by mouth daily.      . Cranberry 125 MG TABS Take 2 tablets by mouth.    . escitalopram (LEXAPRO) 20 MG tablet Take 1 tablet (20 mg total) by mouth daily. Historical med 90 tablet 3  . levothyroxine (SYNTHROID, LEVOTHROID) 75 MCG tablet Take 1 tablet (75 mcg total) by mouth daily. 90 tablet 3  . Multiple Vitamin (MULTIVITAMIN) tablet Take 1 tablet by mouth daily.      . Omega-3 Fatty Acids (FISH OIL PO) Take 1 capsule by mouth daily.    [name] omeprazole (PRILOSEC OTC) 20 MG tablet Take 1 tablet (20 mg total) by mouth daily. 90 tablet 3  . vitamin C (ASCORBIC ACID) 500 MG tablet Take 500 mg by mouth daily.     Current Facility-Administered Medications  Medication Dose Route Frequency  Provider Last Rate Last Dose  . nitroGLYCERIN (NITROSTAT) SL tablet 0.3 mg  0.3 mg Sublingual Q5 min PRN [name], DO   0.3 mg at 10/26/15 1420    PAST MEDICAL HISTORY: Past Medical History:  Diagnosis Date  . Adjustment disorder with mixed anxiety and depressed mood 12/12/2007   Qualifier: Diagnosis of  By: Copland MD, Karleen Hampshire    . Cancer (HCC) 2000   breast had lumpectomy/radiation x36,mammo ,no chemo  . Cataract of both eyes   . Depression   . Dysuria-frequency syndrome    takes AZO  . Frozen shoulder   . Gall stones   . GERD 12/12/2007    Qualifier: Diagnosis of  By: Copland MD, Karleen Hampshire    . Hyperlipidemia   . Hypothyroidism 12/12/2007   Qualifier: Diagnosis of  By: Copland MD, Karleen Hampshire    . Memory loss   . Osteopenia    BMD 2004, WNL 2008  . Pneumonia   . Recurrent UTI   . Shingles    chronic body pain, left side of body  . Shortness of breath dyspnea    when climbing stairs only  . Wears glasses     PAST SURGICAL HISTORY: Past Surgical History:  Procedure Laterality Date  . BREAST LUMPECTOMY Left 2000  . CHOLECYSTECTOMY N/A 02/21/2015   Procedure: LAPAROSCOPIC CHOLECYSTECTOMY WITH INTRAOPERATIVE CHOLANGIOGRAM;  Surgeon: Gaynelle Adu, MD;  [facility];  Service: General;  Laterality: N/A;  . COLONOSCOPY    . MULTIPLE TOOTH EXTRACTIONS      FAMILY HISTORY: Family History  Problem Relation Age of Onset  . Osteoporosis Mother   . Parkinsonism Father   . Cancer Sister        breast CA  . Cancer Other        breast CA  . Coronary artery disease Sister   . Heart disease Maternal Grandmother   . Heart disease Maternal Grandfather     SOCIAL HISTORY:  Social History   Socioeconomic History  . Marital status: Married    Spouse name: Not on file  . Number of children: 2  . Years of education: 38  . Highest education level: High school graduate  Social Needs  . Financial resource strain: Not on file  . Food insecurity - worry: Not on file  . Food insecurity - inability: Not on file  . Transportation needs - medical: Not on file  . Transportation needs - non-medical: Not on file  Occupational History  . Not on file  Tobacco Use  . Smoking status: Never Smoker  . Smokeless tobacco: Never Used  Substance and Sexual Activity  . Alcohol use: No    Alcohol/week: 0.0 oz  . Drug use: No  . Sexual activity: No    Birth control/protection: Post-menopausal  Other Topics Concern  . Not on file  Social History Narrative   Lives at home with her husband.   Right-handed.   [employer] most days.      PHYSICAL EXAM   Vitals:   03/28/17 0745  BP: 117/77  Pulse: (!) 101  Weight: 175 lb 8 oz (79.6 kg)  Height: 5' 2.5" (1.588 m)    Not recorded      Body mass index is 31.59 kg/m.  PHYSICAL EXAMNIATION:  Gen: NAD, conversant, well nourised, obese, well groomed                     Cardiovascular: Regular rate rhythm, no peripheral edema, warm, nontender. Eyes: Conjunctivae  clear without exudates or hemorrhage Neck: Supple, no carotid bruits. Pulmonary: Clear to auscultation bilaterally   NEUROLOGICAL EXAM:  MENTAL STATUS: MMSE - Mini Mental State Exam 03/28/2017 03/26/2017 09/21/2015  Orientation to time 4 5 4   Orientation to Place 5 5 5   Registration 3 3 3   Attention/ Calculation 5 2 5   Recall 2 1 2   Language- name 2 objects 2 2 2   Language- repeat 1 1 1   Language- follow 3 step command 3 3 3   Language- read & follow direction 1 1 1   Write a sentence 1 1 1   Copy design 1 1 0  Total score 28 25 27   animal naming 8   CRANIAL NERVES: CN II: Visual fields are full to confrontation. Fundoscopic exam is normal with sharp discs and no vascular changes. Pupils are round equal and briskly reactive to light. CN III, IV, VI: extraocular movement are normal. No ptosis. CN V: Facial sensation is intact to pinprick in all 3 divisions bilaterally. Corneal responses are intact.  CN VII: Face is symmetric with normal eye closure and smile. CN VIII: Hearing is normal to rubbing fingers CN IX, X: Palate elevates symmetrically. Phonation is normal. CN XI: Head turning and shoulder shrug are intact CN XII: Tongue is midline with normal movements and no atrophy.  MOTOR: There is no pronator drift of out-stretched arms. Muscle bulk and tone are normal. Muscle strength is normal.  REFLEXES: Reflexes are 2+ and symmetric at the biceps, triceps, knees, and ankles. Plantar responses are flexor.  SENSORY: Intact to light touch, pinprick, positional sensation and vibratory sensation  are intact in fingers and toes.  COORDINATION: Rapid alternating movements and fine finger movements are intact. There is no dysmetria on finger-to-nose and heel-knee-shin.    GAIT/STANCE: Posture is normal. Gait is steady with normal steps, base, arm swing, and turning. Heel and toe walking are normal. Tandem gait is normal.  Romberg is absent.   DIAGNOSTIC DATA (LABS, IMAGING, TESTING) - I reviewed patient records, labs, notes, testing and imaging myself where available.   ASSESSMENT AND PLAN  SAQUANA MARUSKA is a 77 y.o. female   Mild cognitive impairment  Mini-Mental Status Examination 28/30  Family history of dementia  History of depression anxiety  This could indicate an early central nervous system degenerative disorder versus mood disorder  No treatable etiology found on recent laboratory evaluation  Proceed with MRI of brain   Levert Feinstein, M.D. Ph.D.  [facility] [address], [address] [place], [place] [address] Ph: [phone] Fax: [phone]  CC: [name], DO

## 2017-04-08 ENCOUNTER — Ambulatory Visit
Admission: RE | Admit: 2017-04-08 | Discharge: 2017-04-08 | Disposition: A | Discharge status: Home / Self Care | Payer: Medicare HMO | Source: Ambulatory Visit | Attending: Family Medicine | Admitting: Family Medicine

## 2017-04-08 DIAGNOSIS — Z1231 Encounter for screening mammogram for malignant neoplasm of breast: Secondary | ICD-10-CM

## 2017-04-08 HISTORY — DX: Personal history of irradiation: Z92.3

## 2017-04-09 ENCOUNTER — Ambulatory Visit
Admission: RE | Admit: 2017-04-09 | Discharge: 2017-04-09 | Disposition: A | Discharge status: Home / Self Care | Payer: Medicare HMO | Source: Ambulatory Visit | Attending: Neurology | Admitting: Neurology

## 2017-04-09 DIAGNOSIS — R413 Other amnesia: Secondary | ICD-10-CM | POA: Diagnosis not present

## 2017-04-12 ENCOUNTER — Telehealth: Payer: Self-pay | Admitting: Family Medicine

## 2017-04-12 NOTE — Telephone Encounter (Signed)
Forwarding message to medical assistant/ Provider to contact pt w/ results of MRI.  --glh

## 2017-04-15 ENCOUNTER — Telehealth: Payer: Self-pay | Admitting: *Deleted

## 2017-04-15 ENCOUNTER — Encounter: Payer: Self-pay | Admitting: *Deleted

## 2017-04-15 NOTE — Telephone Encounter (Signed)
If I did not order the test, it is the responsibility of the ordering physician to review that with the patient

## 2017-04-15 NOTE — Telephone Encounter (Signed)
Spoke to patient - she is aware of results.  She will add aspirin 81mg  daily to her medications and increase her water intake.  Says she is active and exercises three times weekly.

## 2017-04-15 NOTE — Telephone Encounter (Signed)
Patient advised.

## 2017-04-15 NOTE — Telephone Encounter (Signed)
Patient states Dr Rhea Belton office advised her to call Dr Raliegh Scarlet for results.

## 2017-04-15 NOTE — Telephone Encounter (Signed)
Please call patient, MRI of the brain showed small vessel disease, there is no acute abnormality, mild worsening compared to 2011, I will review MRIs with her at next follow-up visit, she should take baby aspirin daily, increase water intake, moderate exercise,  MRI brain (without) demonstrating: 1. Mild periventricular and subcortical chronic small vessel ischemic disease; mildly progressed since 2011. 2. No acute findings.

## 2017-05-14 ENCOUNTER — Telehealth: Payer: Self-pay | Admitting: Family Medicine

## 2017-05-30 ENCOUNTER — Other Ambulatory Visit: Payer: Medicare HMO

## 2017-05-30 ENCOUNTER — Encounter: Payer: Medicare HMO | Admitting: Family Medicine

## 2017-05-30 DIAGNOSIS — E669 Obesity, unspecified: Secondary | ICD-10-CM

## 2017-05-30 DIAGNOSIS — E785 Hyperlipidemia, unspecified: Secondary | ICD-10-CM

## 2017-05-30 DIAGNOSIS — E039 Hypothyroidism, unspecified: Secondary | ICD-10-CM

## 2017-05-30 DIAGNOSIS — M8580 Other specified disorders of bone density and structure, unspecified site: Secondary | ICD-10-CM

## 2017-05-30 DIAGNOSIS — Z0000 Encounter for general adult medical examination without abnormal findings: Secondary | ICD-10-CM

## 2017-05-30 DIAGNOSIS — M1812 Unilateral primary osteoarthritis of first carpometacarpal joint, left hand: Secondary | ICD-10-CM

## 2017-05-30 DIAGNOSIS — E782 Mixed hyperlipidemia: Secondary | ICD-10-CM

## 2017-05-30 DIAGNOSIS — Z13820 Encounter for screening for osteoporosis: Secondary | ICD-10-CM

## 2017-05-30 DIAGNOSIS — E7849 Other hyperlipidemia: Secondary | ICD-10-CM

## 2017-05-30 DIAGNOSIS — R413 Other amnesia: Secondary | ICD-10-CM

## 2017-05-31 LAB — COMPREHENSIVE METABOLIC PANEL
A/G RATIO: 1.4 (ref 1.2–2.2)
ALT: 28 IU/L (ref 0–32)
AST: 27 IU/L (ref 0–40)
Albumin: 4.1 g/dL (ref 3.5–4.8)
Alkaline Phosphatase: 107 IU/L (ref 39–117)
BILIRUBIN TOTAL: 0.6 mg/dL (ref 0.0–1.2)
BUN/Creatinine Ratio: 10 — ABNORMAL LOW (ref 12–28)
BUN: 10 mg/dL (ref 8–27)
CALCIUM: 10.2 mg/dL (ref 8.7–10.3)
CHLORIDE: 105 mmol/L (ref 96–106)
CO2: 25 mmol/L (ref 20–29)
Creatinine, Ser: 1.04 mg/dL — ABNORMAL HIGH (ref 0.57–1.00)
GFR, EST AFRICAN AMERICAN: 60 mL/min/{1.73_m2} (ref >59)
GFR, EST NON AFRICAN AMERICAN: 52 mL/min/{1.73_m2} — AB (ref >59)
GLOBULIN, TOTAL: 2.9 g/dL (ref 1.5–4.5)
Glucose: 98 mg/dL (ref 65–99)
POTASSIUM: 4.6 mmol/L (ref 3.5–5.2)
SODIUM: 144 mmol/L (ref 134–144)
Total Protein: 7 g/dL (ref 6.0–8.5)

## 2017-05-31 LAB — TSH: TSH: 1.96 u[IU]/mL (ref 0.450–4.500)

## 2017-05-31 LAB — HEMOGLOBIN A1C
ESTIMATED AVERAGE GLUCOSE: 117 mg/dL
HEMOGLOBIN A1C: 5.7 % — AB (ref 4.8–5.6)

## 2017-05-31 LAB — CBC WITH DIFFERENTIAL/PLATELET
BASOS: 0 %
Basophils Absolute: 0 10*3/uL (ref 0.0–0.2)
EOS (ABSOLUTE): 0.1 10*3/uL (ref 0.0–0.4)
EOS: 1 %
HEMATOCRIT: 43.5 % (ref 34.0–46.6)
Hemoglobin: 14.9 g/dL (ref 11.1–15.9)
IMMATURE GRANULOCYTES: 0 %
Immature Grans (Abs): 0 10*3/uL (ref 0.0–0.1)
Lymphocytes Absolute: 1.9 10*3/uL (ref 0.7–3.1)
Lymphs: 29 %
MCH: 31.8 pg (ref 26.6–33.0)
MCHC: 34.3 g/dL (ref 31.5–35.7)
MCV: 93 fL (ref 79–97)
MONOS ABS: 0.7 10*3/uL (ref 0.1–0.9)
Monocytes: 11 %
NEUTROS ABS: 3.9 10*3/uL (ref 1.4–7.0)
Neutrophils: 59 %
PLATELETS: 297 10*3/uL (ref 150–379)
RBC: 4.68 x10E6/uL (ref 3.77–5.28)
RDW: 13.9 % (ref 12.3–15.4)
WBC: 6.6 10*3/uL (ref 3.4–10.8)

## 2017-05-31 LAB — LIPID PANEL
CHOLESTEROL TOTAL: 192 mg/dL (ref 100–199)
Chol/HDL Ratio: 4.8 ratio — ABNORMAL HIGH (ref 0.0–4.4)
HDL: 40 mg/dL (ref >39)
LDL CALC: 105 mg/dL — AB (ref 0–99)
Triglycerides: 236 mg/dL — ABNORMAL HIGH (ref 0–149)
VLDL CHOLESTEROL CAL: 47 mg/dL — AB (ref 5–40)

## 2017-05-31 LAB — VITAMIN D 25 HYDROXY (VIT D DEFICIENCY, FRACTURES): Vit D, 25-Hydroxy: 47.4 ng/mL (ref 30.0–100.0)

## 2017-05-31 LAB — VITAMIN B12: VITAMIN B 12: 1146 pg/mL (ref 232–1245)

## 2017-06-10 ENCOUNTER — Ambulatory Visit: Payer: Medicare HMO | Admitting: Family Medicine

## 2017-07-02 ENCOUNTER — Encounter: Payer: Self-pay | Admitting: Neurology

## 2017-07-02 ENCOUNTER — Ambulatory Visit: Payer: Medicare HMO | Admitting: Neurology

## 2017-07-02 VITALS — BP 105/64 | HR 75 | Ht 62.5 in | Wt 174.0 lb

## 2017-07-02 DIAGNOSIS — F5104 Psychophysiologic insomnia: Secondary | ICD-10-CM

## 2017-07-02 DIAGNOSIS — R4189 Other symptoms and signs involving cognitive functions and awareness: Secondary | ICD-10-CM | POA: Insufficient documentation

## 2017-07-02 DIAGNOSIS — G3184 Mild cognitive impairment, so stated: Secondary | ICD-10-CM | POA: Diagnosis not present

## 2017-07-02 DIAGNOSIS — I679 Cerebrovascular disease, unspecified: Secondary | ICD-10-CM | POA: Diagnosis not present

## 2017-07-02 MED ORDER — DONEPEZIL HCL 10 MG PO TABS: 10.0000 mg | ORAL_TABLET | Freq: Every day | ORAL | 11 refills | Status: DC

## 2017-07-02 MED ORDER — CLONAZEPAM 0.5 MG PO TABS: 0.5000 mg | ORAL_TABLET | Freq: Every day | ORAL | 5 refills | Status: DC

## 2017-07-02 MED ORDER — MEMANTINE HCL 10 MG PO TABS: 10.0000 mg | ORAL_TABLET | Freq: Two times a day (BID) | ORAL | 11 refills | Status: DC

## 2017-07-02 NOTE — Progress Notes (Signed)
PATIENT: Frances Hoffman DOB: 1941/01/18  Chief Complaint  Patient presents with  . Memory Loss    She is here with her husband, Frances Hoffman, to further review her MRI results. She has not started the aspirin 81mg  daily yet.  No changes in memory.  Recent MMSE on 03/28/17 was 28/30.  [name] Insomnia    She is having difficulty falling asleep.  She has tried melantonin 5mg , Benadryl and Tylenol PM without benefit.     HISTORICAL  UNKNOWN KUSCHEL is a 77 year old female, accompanied by her husband Frances Hoffman, seen in refer by her primary care doctor [name], for evaluation of memory loss, initial evaluation was on January 24th 2019.  I have reviewed and summarized the referring note, she has history of  hyperlipidemia, breast cancer, hypothyroidism, on supplement.  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  She does have strong family history of dementia, her father suffered Parkinson's disease, memory loss, 101 year old sister suffer dementia.  Since summer 2018, she was noted to have mild memory loss, but the most bothersome symptoms is her mood swings, and Christmas time 2018, she invited her sister stay with her, and then she decided to break up the relationship, couple days later, she was able to talk with them for hours on the phone again, this has puzzled her husband.  Husband also reported that she tends to be over criticize him, tends to be emotional.  She came back from a social gathering of her high school classmates recently, thinking her friends have ignored her.  Lab in May 2018, B12, normal. TSH was elevated 4.6, LDL 116, triglyceride 197.  UPDATE July 02 2017: She continue have mood swings occasionally, slow worsening memory loss,   We reviewed MRI in Feb 2019, in comparison to MRI in 2011, progression of generalized atrophy especially bilateral perisylvian fissure  area, increased supratentorium small vessel disease, she had episode of sudden onset amnesia lasting for few hours in 2011, sounds like transient global amnesia,   REVIEW OF SYSTEMS: Full 14 system review of systems performed and notable only for depression, cramps, headaches, swelling legs, fatigue  ALLERGIES: Allergies  Allergen Reactions  . Ciprofloxacin Other (See Comments)    Unknown (vertigo??)  . Citalopram Hydrobromide Other (See Comments)    Intrusive/ odd thoughts   . Flagyl [Metronidazole] Other (See Comments)    Unknown??  . Oxycodone Other (See Comments)    Headaches    HOME MEDICATIONS: Current Outpatient Medications  Medication Sig Dispense Refill  . aspirin 81 MG tablet Take 81 mg by mouth daily.    [name] atorvastatin (LIPITOR) 20 MG tablet Take 1 tablet (20 mg total) by mouth daily at 6 PM. 90 tablet 3  . b complex vitamins tablet Take 1 tablet by mouth daily.    . calcium carbonate (OS-CAL - DOSED IN MG OF ELEMENTAL CALCIUM) 1250 (500 CA) MG tablet Take 1 tablet by mouth daily.    . carboxymethylcellulose (REFRESH PLUS) 0.5 % SOLN Place 1 drop into both eyes 3 (three) times daily as needed (for dryness).    . Cholecalciferol (VITAMIN D3) 2000 UNITS capsule Take 2,000 Units by mouth daily.      . Cranberry 125 MG TABS Take 2 tablets by mouth.    . escitalopram (LEXAPRO) 20 MG tablet Take 1 tablet (20 mg total) by mouth daily. Historical med 90 tablet 3  .  levothyroxine (SYNTHROID, LEVOTHROID) 75 MCG tablet Take 1 tablet (75 mcg total) by mouth daily. 90 tablet 3  . Multiple Vitamin (MULTIVITAMIN) tablet Take 1 tablet by mouth daily.      . Omega-3 Fatty Acids (FISH OIL PO) Take 1 capsule by mouth daily.    [name] omeprazole (PRILOSEC OTC) 20 MG tablet Take 1 tablet (20 mg total) by mouth daily. 90 tablet 3  . vitamin C (ASCORBIC ACID) 500 MG tablet Take 500 mg by mouth daily.     Current Facility-Administered Medications  Medication Dose Route Frequency Provider Last Rate  Last Dose  . nitroGLYCERIN (NITROSTAT) SL tablet 0.3 mg  0.3 mg Sublingual Q5 min PRN Opalski, Deborah, DO   0.3 mg at 10/26/15 1420    PAST MEDICAL HISTORY: Past Medical History:  Diagnosis Date  . Adjustment disorder with mixed anxiety and depressed mood 12/12/2007   Qualifier: Diagnosis of  By: Copland MD, Karleen Hampshire    . Cancer (HCC) 2000   breast had lumpectomy/radiation x36,mammo ,no chemo  . Cataract of both eyes   . Depression   . Dysuria-frequency syndrome    takes AZO  . Frozen shoulder   . Gall stones   . GERD 12/12/2007   Qualifier: Diagnosis of  By: Copland MD, Karleen Hampshire    . Hyperlipidemia   . Hypothyroidism 12/12/2007   Qualifier: Diagnosis of  By: Copland MD, Karleen Hampshire    . Memory loss   . Osteopenia    BMD 2004, WNL 2008  . Personal history of radiation therapy   . Pneumonia   . Recurrent UTI   . Shingles    chronic body pain, left side of body  . Shortness of breath dyspnea    when climbing stairs only  . Wears glasses     PAST SURGICAL HISTORY: Past Surgical History:  Procedure Laterality Date  . BREAST LUMPECTOMY Left 2000  . CHOLECYSTECTOMY N/A 02/21/2015   Procedure: LAPAROSCOPIC CHOLECYSTECTOMY WITH INTRAOPERATIVE CHOLANGIOGRAM;  Surgeon: Gaynelle Adu, MD;  Location[facility];  Service: General;  Laterality: N/A;  . COLONOSCOPY    . MULTIPLE TOOTH EXTRACTIONS      FAMILY HISTORY: Family History  Problem Relation Age of Onset  . Osteoporosis Mother   . Parkinsonism Father   . Cancer Sister        breast CA  . Breast cancer Sister 64  . Cancer Other        breast CA  . Coronary artery disease Sister   . Breast cancer Sister   . Heart disease Maternal Grandmother   . Heart disease Maternal Grandfather     SOCIAL HISTORY:  Social History   Socioeconomic History  . Marital status: Married    Spouse name: Not on file  . Number of children: 2  . Years of education: 58  . Highest education level: High school graduate  Occupational History  .  Not on file  Social Needs  . Financial resource strain: Not on file  . Food insecurity:    Worry: Not on file    Inability: Not on file  . Transportation needs:    Medical: Not on file    Non-medical: Not on file  Tobacco Use  . Smoking status: Never Smoker  . Smokeless tobacco: Never Used  Substance and Sexual Activity  . Alcohol use: No    Alcohol/week: 0.0 oz  . Drug use: No  . Sexual activity: Never    Birth control/protection: Post-menopausal  Lifestyle  . Physical activity:  Days per week: Not on file    Minutes per session: Not on file  . Stress: Not on file  Relationships  . Social connections:    Talks on phone: Not on file    Gets together: Not on file    Attends religious service: Not on file    Active member of club or organization: Not on file    Attends meetings of clubs or organizations: Not on file    Relationship status: Not on file  . Intimate partner violence:    Fear of current or ex partner: Not on file    Emotionally abused: Not on file    Physically abused: Not on file    Forced sexual activity: Not on file  Other Topics Concern  . Not on file  Social History Narrative   Lives at home with her husband.   Right-handed.   [employer] most days.     PHYSICAL EXAM   Vitals:   07/02/17 1024  BP: 105/64  Pulse: 75  Weight: 174 lb (78.9 kg)  Height: 5' 2.5" (1.588 m)    Not recorded      Body mass index is 31.32 kg/m.  PHYSICAL EXAMNIATION:  Gen: NAD, conversant, well nourised, obese, well groomed                     Cardiovascular: Regular rate rhythm, no peripheral edema, warm, nontender. Eyes: Conjunctivae clear without exudates or hemorrhage Neck: Supple, no carotid bruits. Pulmonary: Clear to auscultation bilaterally   NEUROLOGICAL EXAM:  MENTAL STATUS: MMSE - Mini Mental State Exam 03/28/2017 03/26/2017 09/21/2015  Orientation to time 4 5 4   Orientation to Place 5 5 5   Registration 3 3 3   Attention/ Calculation 5 2 5     Recall 2 1 2   Language- name 2 objects 2 2 2   Language- repeat 1 1 1   Language- follow 3 step command 3 3 3   Language- read & follow direction 1 1 1   Write a sentence 1 1 1   Copy design 1 1 0  Total score 28 25 27   animal naming 8   CRANIAL NERVES: CN II: Visual fields are full to confrontation. Fundoscopic exam is normal with sharp discs and no vascular changes. Pupils are round equal and briskly reactive to light. CN III, IV, VI: extraocular movement are normal. No ptosis. CN V: Facial sensation is intact to pinprick in all 3 divisions bilaterally. Corneal responses are intact.  CN VII: Face is symmetric with normal eye closure and smile. CN VIII: Hearing is normal to rubbing fingers CN IX, X: Palate elevates symmetrically. Phonation is normal. CN XI: Head turning and shoulder shrug are intact CN XII: Tongue is midline with normal movements and no atrophy.  MOTOR: There is no pronator drift of out-stretched arms. Muscle bulk and tone are normal. Muscle strength is normal.  REFLEXES: Reflexes are 2+ and symmetric at the biceps, triceps, knees, and ankles. Plantar responses are flexor.  SENSORY: Intact to light touch, pinprick, positional sensation and vibratory sensation are intact in fingers and toes.  COORDINATION: Rapid alternating movements and fine finger movements are intact. There is no dysmetria on finger-to-nose and heel-knee-shin.    GAIT/STANCE: Posture is normal. Gait is steady with normal steps, base, arm swing, and turning. Heel and toe walking are normal. Tandem gait is normal.  Romberg is absent.   DIAGNOSTIC DATA (LABS, IMAGING, TESTING) - I reviewed patient records, labs, notes, testing and imaging myself where available.  ASSESSMENT AND PLAN  AXELLE DEELY is a 77 y.o. female   Mild cognitive impairment  Mini-Mental Status Examination 28/30  Family history of dementia  Likely central nervous system degenerative disorder  No treatable etiology  found on recent laboratory evaluation  Proceed with memantine 10 mg twice a day, Aricept 10 mg daily  Cerebrovascular disease  Vascular risk factor of aging, hypertension, hypothyroidism  Start baby aspirin  Echocardiogram  Ultrasound of carotid artery  Levert Feinstein, M.D. Ph.D.  [facility] [address] [place], [place] [address] Ph: [phone] Fax: [phone]  CC: [name], DO

## 2017-07-05 ENCOUNTER — Telehealth: Payer: Self-pay | Admitting: Neurology

## 2017-07-05 NOTE — Telephone Encounter (Signed)
Pt called stating she has taken 2 days of donepezil (ARICEPT) 10 MG tablet and memantine (NAMENDA) 10 MG tablet. Stating that since taken she has had a headache, a minor fever, overall not feeling well. Pt requesting a call to discuss

## 2017-07-05 NOTE — Telephone Encounter (Signed)
Per vo by Dr. Krista Blue, discontinue donepezil and continue memantine to see if symptoms improve.  She can restart the donepezil in 3-4 weeks to see if she can tolerate both medications.  Additionally, a fever is not a side effect of these medications.  If it persist, she needs to be evaluated by her PCP to rule out infection or other cause.  Spoke to her husband who verbalized understanding of this plan.

## 2017-07-08 NOTE — Telephone Encounter (Signed)
Pt called stating she had stopped donepezil last week but has been experiencing the same s/e such as headaches, nausea,   and just overall not well. Pt states she is no longer having any fever. Please call to advise

## 2017-07-08 NOTE — Telephone Encounter (Signed)
Left message requesting a return call.

## 2017-07-08 NOTE — Telephone Encounter (Addendum)
Spoke to patient's husband (on [place]) - says she is still not feeling well, in general (headache and nausea).  She has not seen her PCP yet to rule out any other causes for her symptoms.  She is still taking memantine 10mg . BID.  If no other reason for symptoms are found, she can stop the memantine to see if she feels better.  He will encourage her to see PCP.  Dr. Krista Blue is aware of this concern and in agreement with this plan.

## 2017-07-09 ENCOUNTER — Ambulatory Visit: Payer: Medicare HMO | Admitting: Neurology

## 2017-07-09 ENCOUNTER — Ambulatory Visit (HOSPITAL_COMMUNITY)
Admission: RE | Admit: 2017-07-09 | Discharge: 2017-07-09 | Disposition: A | Discharge status: Home / Self Care | Payer: Medicare HMO | Source: Ambulatory Visit | Attending: Neurology | Admitting: Neurology

## 2017-07-09 DIAGNOSIS — I69919 Unspecified symptoms and signs involving cognitive functions following unspecified cerebrovascular disease: Secondary | ICD-10-CM | POA: Insufficient documentation

## 2017-07-09 DIAGNOSIS — G3184 Mild cognitive impairment, so stated: Secondary | ICD-10-CM

## 2017-07-09 DIAGNOSIS — I6523 Occlusion and stenosis of bilateral carotid arteries: Secondary | ICD-10-CM | POA: Insufficient documentation

## 2017-07-09 DIAGNOSIS — I679 Cerebrovascular disease, unspecified: Secondary | ICD-10-CM

## 2017-07-09 NOTE — Progress Notes (Signed)
*  PRELIMINARY RESULTS* Vascular Ultrasound Carotid Duplex (Doppler) has been completed.   Findings suggest 1-39% internal carotid artery stenosis bilaterally. Vertebral arteries are patent with antegrade flow.  07/09/2017 2:36 PM Maudry Mayhew, BS, RVT, RDCS, RDMS

## 2017-07-16 ENCOUNTER — Ambulatory Visit (HOSPITAL_COMMUNITY): Payer: Medicare HMO | Attending: Cardiology

## 2017-07-16 ENCOUNTER — Other Ambulatory Visit: Payer: Self-pay

## 2017-07-16 DIAGNOSIS — G3184 Mild cognitive impairment, so stated: Secondary | ICD-10-CM

## 2017-07-16 DIAGNOSIS — I679 Cerebrovascular disease, unspecified: Secondary | ICD-10-CM | POA: Diagnosis not present

## 2017-07-16 DIAGNOSIS — E785 Hyperlipidemia, unspecified: Secondary | ICD-10-CM | POA: Insufficient documentation

## 2017-07-25 ENCOUNTER — Other Ambulatory Visit: Payer: Self-pay | Admitting: Family Medicine

## 2017-07-25 DIAGNOSIS — E7849 Other hyperlipidemia: Secondary | ICD-10-CM

## 2017-07-26 ENCOUNTER — Other Ambulatory Visit: Payer: Self-pay | Admitting: Family Medicine

## 2017-07-26 DIAGNOSIS — E039 Hypothyroidism, unspecified: Secondary | ICD-10-CM

## 2017-08-12 ENCOUNTER — Telehealth: Payer: Self-pay

## 2017-08-12 NOTE — Telephone Encounter (Signed)
Received Epic notification that pt has not read MyChart message regarding results.    From Fonnie Mu, CMA To [name] Sent 07/26/2017 10:52 AM  Dear Ms. Frances Hoffman,   Our office received a refill request for your thyroid medication. We have refilled this medication for 60 days only. You will need to have a complete physical with Dr. Raliegh Scarlet, as directed at your January visit, before any further refills will be provided.  Please call our office at your earliest convenience to schedule this appointment.    Thank you and I hope you have a wonderful Memorial Day weekend!   Kenney Houseman, CMA   [address]   MyChart User Last Read On  Frances Hoffman Not Read      Please call pt and have her schedule a complete physical with Dr. Raliegh Scarlet.  No further refills until pt is seen by Dr. Raliegh Scarlet. Thanks! [name], CMA

## 2017-09-24 ENCOUNTER — Other Ambulatory Visit: Payer: Self-pay | Admitting: Family Medicine

## 2017-09-24 DIAGNOSIS — E039 Hypothyroidism, unspecified: Secondary | ICD-10-CM

## 2017-09-25 ENCOUNTER — Other Ambulatory Visit: Payer: Self-pay | Admitting: Family Medicine

## 2017-09-25 DIAGNOSIS — E039 Hypothyroidism, unspecified: Secondary | ICD-10-CM

## 2017-09-27 ENCOUNTER — Ambulatory Visit (INDEPENDENT_AMBULATORY_CARE_PROVIDER_SITE_OTHER): Payer: Medicare HMO | Admitting: Family Medicine

## 2017-09-27 ENCOUNTER — Encounter: Payer: Self-pay | Admitting: Family Medicine

## 2017-09-27 VITALS — BP 100/68 | HR 78 | Ht 63.0 in | Wt 174.3 lb

## 2017-09-27 DIAGNOSIS — R413 Other amnesia: Secondary | ICD-10-CM | POA: Insufficient documentation

## 2017-09-27 DIAGNOSIS — F3289 Other specified depressive episodes: Secondary | ICD-10-CM

## 2017-09-27 DIAGNOSIS — E7849 Other hyperlipidemia: Secondary | ICD-10-CM

## 2017-09-27 DIAGNOSIS — F0150 Vascular dementia without behavioral disturbance: Secondary | ICD-10-CM | POA: Diagnosis not present

## 2017-09-27 DIAGNOSIS — F5104 Psychophysiologic insomnia: Secondary | ICD-10-CM

## 2017-09-27 DIAGNOSIS — F4323 Adjustment disorder with mixed anxiety and depressed mood: Secondary | ICD-10-CM

## 2017-09-27 DIAGNOSIS — R7303 Prediabetes: Secondary | ICD-10-CM

## 2017-09-27 DIAGNOSIS — E039 Hypothyroidism, unspecified: Secondary | ICD-10-CM

## 2017-09-27 HISTORY — DX: Other amnesia: R41.3

## 2017-09-27 MED ORDER — ATORVASTATIN CALCIUM 20 MG PO TABS: 20.0000 mg | ORAL_TABLET | Freq: Every day | ORAL | 3 refills | Status: DC

## 2017-09-27 MED ORDER — TRAZODONE HCL 50 MG PO TABS: ORAL_TABLET | ORAL | 0 refills | Status: DC

## 2017-09-27 MED ORDER — ESCITALOPRAM OXALATE 20 MG PO TABS: ORAL_TABLET | ORAL | 3 refills | Status: DC

## 2017-09-27 MED ORDER — TRAZODONE HCL 50 MG PO TABS: 50.0000 mg | ORAL_TABLET | Freq: Every evening | ORAL | 0 refills | Status: DC | PRN

## 2017-09-27 MED ORDER — SERTRALINE HCL 50 MG PO TABS: ORAL_TABLET | ORAL | 0 refills | Status: DC

## 2017-09-27 MED ORDER — LEVOTHYROXINE SODIUM 75 MCG PO TABS: 75.0000 ug | ORAL_TABLET | Freq: Every day | ORAL | 1 refills | Status: DC

## 2017-09-27 NOTE — Patient Instructions (Addendum)
For sleep or anxiety/panic please do not take your clonazepam.  Please throw it away.  We are changing you to trazodone.  Also we are transitioning you off the Escitalopram and putting you on sertraline which is Zoloft.  Of your omega-3 fatty acids which should be around 1200 mg/pill, I want you to take 2 of those in the morning after breakfast and 2 in the evenings after dinner.      Vascular Dementia Dementia is a condition in which a person has problems with thinking, memory, and behavior that are severe enough to interfere with daily life. Vascular dementia is a type of dementia. It results from brain damage that is caused by the brain not getting enough blood. Vascular dementia usually begins between 56 and 43 years of age. What are the causes? Vascular dementia is caused by conditions that lessen blood flow to the brain. Common causes include:  Multiple small strokes. These may happen without symptoms (silent stroke).  Major stroke.  Damage to small blood vessels in the brain (cerebral small vessel disease).  What increases the risk?  Advancing age.  Having had a stroke.  Having high blood pressure (hypertension) or high cholesterol.  Having a disease that affects the heart or blood vessels.  Smoking.  Having diabetes.  Being female.  Being obese.  Not being active.  Having depression. What are the signs or symptoms? Symptoms can vary a lot from one person to another. Symptoms may be mild or severe depending on the amount of damage and which parts of the brain have been affected. Symptoms may begin suddenly or may develop gradually. Symptoms may remain stable, or they may get worse over time. Symptoms of vascular dementia may be similar to those of Alzheimer disease. The two conditions can occur together (mixed dementia). Symptoms of vascular dementia may include: Mental  Confusion.  Memory problems.  Poor attention and concentration.  Trouble understanding  speech.  Depression.  Personality changes.  Trouble recognizing familiar people.  Agitation or aggression.  Paranoia.  Delusions or hallucinations. Physical  Weakness.  Poor balance.  Loss of bladder or bowel control (incontinence).  Unsteady walking (gait).  Speaking problems. Behavioral  Getting lost in familiar places.  Problems with planning and judgment.  Trouble following instructions.  Social problems.  Emotional outbursts.  Trouble with daily activities and self-care.  Problems handling money. How is this diagnosed? There is not a specific test to diagnose vascular dementia. The health care provider will consider the person's medical history and symptoms or changes that are reported by friends and family. The health care provider will do a physical exam and may order lab tests or other tests that check brain and nervous system function. Tests that may be done include:  Blood tests.  Brain imaging tests.  Tests of movement, speech, and other daily activities (neurological exam).  Tests of memory, thinking, and problem-solving (neuropsychological or neurocognitive testing).  Diagnosis may involve several specialists. These may include a health care provider who specializes in the brain and nervous system (neurologist), a provider who specializes in disorders of the mind (psychiatrist), and a provider who focuses on speech and language changes (Electrical engineer). How is this treated? There is no cure for vascular dementia. Brain damage that has already occurred cannot be reversed. Treatment depends on:  How severe the condition is.  Which parts of the brain have been affected.  The person's overall health.  Treatment measures aim to:  Treat the underlying cause of vascular dementia  and manage risk factors. This may include: ? Controlling blood pressure. ? Lowering cholesterol. ? Treating diabetes. ? Quitting smoking. ? Losing weight.  Manage  symptoms.  Prevent further brain damage.  Improve the person's health and quality of life.  Treatment for dementia may involve a team of health care providers, including:  A neurologist.  A psychiatrist.  An occupational therapist.  A speech pathologist.  A cardiologist.  An exercise physiologist or physical therapist.  Follow these instructions at home: Home care for a person with vascular dementia depends on what caused the condition and how severe the symptoms are. General guidelines for care at home include:  Following the health care provider's instructions for treating the condition that caused the dementia.  Using medicines only as told by the person's health care provider.  Creating a safe living space.  Learning ways to help the person remember people, appointments, and daily activities.  Finding a support group to help caregivers and family to cope with the effects of dementia.  Helping family and friends learn about ways to communicate with a person who has dementia.  Making sure the person keeps all follow-up visits and goes to all rehabilitation appointments as told by the health care team. This is important.  Contact a health care provider if:  A fever develops.  New behavioral problems develop.  Problems with swallowing develop.  Confusion gets worse.  Sleepiness gets worse. Get help right away if:  Loss of consciousness occurs.  There is a sudden loss of speech, balance, or thinking ability.  New numbness or paralysis occurs.  Sudden, severe headache occurs.  Vision is lost or suddenly gets worse in one or both eyes. This information is not intended to replace advice given to you by your health care provider. Make sure you discuss any questions you have with your health care provider. Document Released: 02/09/2002 Document Revised: 07/28/2015 Document Reviewed: 06/02/2014 Elsevier Interactive Patient Education  2018 [employer]     Cerebral Arteriosclerosis Cerebral arteriosclerosis is a condition that affects the arteries in your brain. When you have cerebral arteriosclerosis, these arteries become thicker, harder, and narrower than normal. Once this damage starts, a sticky substance (plaque) can build up inside your arteries and block blood flow. This reduces the normal blood flow to your brain. Blood carries oxygen to your brain. Since blood flow to your brain is reduced, your brain may not get the oxygen it needs. This process may start early in life and build up over time. Blood clots or plaques that rupture and break off may completely block blood flow and cause sudden and serious damage. Cerebral arteriosclerosis can cause serious problems such as stroke or dementia. What increases the risk? Risk factors for developing cerebral arteriosclerosis include:  Being female.  Older age. The likelihood of this disorder increases with age.  Being Asian, African American, or Hispanic.  Having a family history of certain conditions, such as heart disease or stroke.  Having heart disease or a history of stroke.  Lifestyle factors that can increase your risk of developing cerebral arteriosclerosis include:  Smoking or being exposed to secondhand smoke.  Having diabetes.  Being overweight.  Not getting enough exercise.  Having high blood pressure (hypertension).  Having high cholesterol.  Having a diet high in fat.  What are the signs or symptoms? The most common symptoms of cerebral arteriosclerosis include:  Numbness or weakness in your arms or legs.  Speech problems.  Problems swallowing.  Vision problems.  Confusion.  Irritability.  Personality changes, such as loss of interest, irritability, or confusion (vascular dementia).  Headache or facial pain.  How is this diagnosed? Your health care provider may diagnose cerebral arteriosclerosis based on your symptoms and a physical exam.  You may also have imaging studiesof your brain to confirm the diagnosis. These may include:  An imaging test using sound waves (ultrasound).  An imaging test after getting an injection of dye (angiogram).  How is this treated? Treatment may include:  Medicine to control conditions that put you at risk of developing cerebral arteriosclerosis. These conditions may include: ? High blood pressure. ? High cholesterol. ? Diabetes.  Taking medicine to thin your blood and prevent stroke.  Surgery. This may be: ? Intracranial stent placement. This is a procedure to locate a blocked artery in the brain, widen the artery, and keep the artery open. ? Surgery to bypass a blocked artery with a healthy artery taken from another part of the body.  Follow these instructions at home:  Take medicine only as directed by your health care provider.  Do not use any tobacco products, including cigarettes, chewing tobacco, or electronic cigarettes. If you need help quitting, ask your health care provider.  Eat a low-fat, low-cholesterol, and low-sodium diet as directed by your health care provider.  Follow an exercise program approved by your health care provider.  Maintain a healthy weight. Lose weight if necessary.  Work with your health care provider to manage other health conditions, such as hypertension or diabetes.  Keep all follow-up visits as directed by your health care provider. This is important. Contact a health care provider if:  You have frequent headaches.  You have dizziness.  You or someone else notices changes in your personality.  You have facial pain.  You have periods of confusion. Get help right away if:  You have a very bad headache.  You have weakness or numbness in your arms or legs.  You have droopiness of your face.  You have difficulty speaking.  You have difficulty swallowing.  You have trouble understanding what other people are saying.  You have  sudden confusion.  You have sudden vision problems.  You have a seizure or loss of consciousness. Any of these symptoms may represent a serious problem that is an emergency. Do not wait to see if the symptoms will go away. Get medical help right away. Call your local emergency services (911 in [place] Do not drive yourself to the hospital. This information is not intended to replace advice given to you by your health care provider. Make sure you discuss any questions you have with your health care provider. Document Released: 02/09/2002 Document Revised: 07/28/2015 Document Reviewed: 04/09/2013 [employer] Interactive Patient Education  2018 [employer].
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
## 2017-10-10 ENCOUNTER — Ambulatory Visit (INDEPENDENT_AMBULATORY_CARE_PROVIDER_SITE_OTHER): Payer: Medicare HMO | Admitting: Family Medicine

## 2017-10-10 ENCOUNTER — Encounter: Payer: Self-pay | Admitting: Family Medicine

## 2017-10-10 VITALS — BP 107/73 | HR 81 | Ht 63.0 in | Wt 174.2 lb

## 2017-10-10 DIAGNOSIS — R519 Headache, unspecified: Secondary | ICD-10-CM

## 2017-10-10 DIAGNOSIS — R51 Headache: Secondary | ICD-10-CM | POA: Diagnosis not present

## 2017-10-10 DIAGNOSIS — K219 Gastro-esophageal reflux disease without esophagitis: Secondary | ICD-10-CM

## 2017-10-10 DIAGNOSIS — H6123 Impacted cerumen, bilateral: Secondary | ICD-10-CM | POA: Insufficient documentation

## 2017-10-10 DIAGNOSIS — R6889 Other general symptoms and signs: Secondary | ICD-10-CM | POA: Diagnosis not present

## 2017-10-10 DIAGNOSIS — R438 Other disturbances of smell and taste: Secondary | ICD-10-CM | POA: Diagnosis not present

## 2017-10-10 LAB — POCT URINALYSIS DIPSTICK
Bilirubin, UA: NEGATIVE
Blood, UA: NEGATIVE
Glucose, UA: NEGATIVE
Ketones, UA: NEGATIVE
Leukocytes, UA: NEGATIVE
NITRITE UA: NEGATIVE
PH UA: 7 (ref 5.0–8.0)
PROTEIN UA: NEGATIVE
Spec Grav, UA: 1.02 (ref 1.010–1.025)
UROBILINOGEN UA: 0.2 U/dL

## 2017-10-10 NOTE — Patient Instructions (Addendum)
Your exam is reassuring today, your urine is clear without evidence of blood or white blood cells\bacteria etc. - we cleaned out both your ears today and they look good. -Please let us know if any new symptoms develop or if you continue to have these "sick "type feelings, we can obtain blood work as a next step

## 2017-10-10 NOTE — Progress Notes (Signed)
Acute Care Office visit  Assessment and plan:  1. Feels sick   2. Bad oral taste   3. Nonintractable headache, unspecified chronicity pattern, unspecified headache type   4. Hearing loss and HA due to cerumen impaction, bilateral   5. Gastroesophageal reflux disease, esophagitis presence not specified     - Patient with nonspecific symptoms, reassuring physical exam, as well as UA.  - Possible Viral vs Allergic vs Bacterial causes for pt's symptoms reveiwed.    - Supportive care and various OTC medications discussed in addition to any prescribed.  Follow-Up - Urinalysis drawn today to rule out urinary tract infection.    - Reviewed with patient need to culture urine sample today to assess for further presence of infection.  - Reviewed prudent care practices and how prescribing antibiotics is not advised at this time.  Will consider ABX if sx continue past 10 days and worsening if not already given.    - Call or RTC if new symptoms, or if no improvement or worse over next several days.  If symptoms do not improve, discussed need to obtain lab work on the patient for further rule out.    No orders of the defined types were placed in this encounter.   There are no discontinued medications.   Orders Placed This Encounter  Procedures  . POCT urinalysis dipstick    Gross side effects, risk and benefits, and alternatives of medications discussed with patient.  Patient is aware that all medications have potential side effects and we are unable to predict every sideeffect or drug-drug interaction that may occur.  Expresses verbal understanding and consents to current therapy plan and treatment regiment.   Education and routine counseling performed. Handouts provided.  Anticipatory guidance and routine counseling done re: condition, txmnt options and need for follow up. All questions of patient's were answered.  Return if symptoms worsen or fail to improve, for Please keep  your chronic follow-up office visits with me..  Please see AVS handed out to patient at the end of our visit for additional patient instructions/ counseling done pertaining to today's office visit.  Note:  This document was partially repared using Dragon voice recognition software and may include unintentional dictation errors.  This document serves as a record of services personally performed by Mellody Dance, DO. It was created on her behalf by Toni Amend, a trained medical scribe. The creation of this record is based on the scribe's personal observations and the provider's statements to them.   I have reviewed the above medical documentation for accuracy and completeness and I concur.  Mellody Dance 11/20/17 10:40 AM    Subjective:    Chief Complaint  Patient presents with  . Oral Pain   HPI:  Pt presents with Sx for two weeks.  Patient states she gets reflux if she doesn't take her reflux pill.  Denies having more heartburn than usual lately.   C/o: Cough initially, now gone; malaise.  Notes headache, pressure in her head, and bad taste in mouth.  States "my tongue feels like it's been burnt" and she can't taste much.  Comments "I just feel bad."  Feels that her throat is dry and "feels terrible."  States she experiences post-nasal drip from time to time; but not much.  Denies: Fever, chills, nausea, vomiting, diarrhea, urinary symptoms.  Currently denies cough or SOB.  Denies burning while urination, frequency, urgency, or increased urge to urinate.    For symptoms patient has tried:  Some OTC treatment.  Overall getting: "I can't get rid of it."   Patient Care Team    Relationship Specialty Notifications Start End  Mellody Dance, DO PCP - General Family Medicine  08/24/15   Barrett, Evelene Croon, PA-C Physician Assistant Cardiology  01/08/16    Comment: 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  Megan Salon, MD Consulting Physician Gynecology  11/14/16      Past medical history, Surgical history, Family history reviewed and noted below, Social history, Allergies, and Medications have been entered into the medical record, reviewed and changed as needed.   Allergies  Allergen Reactions  . Ciprofloxacin Other (See Comments)    Unknown (vertigo??)  . Citalopram Hydrobromide Other (See Comments)    Intrusive/ odd thoughts   . Flagyl [Metronidazole] Other (See Comments)    Unknown??  . Oxycodone Other (See Comments)    Headaches    Review of Systems: - see above HPI for pertinent positives General:   No F/C, wt loss Pulm:   No DIB, pleuritic chest pain Card:  No CP, palpitations Abd:  No n/v/d or pain Ext:  No inc edema from baseline   Objective:   Blood pressure 107/73, pulse 81, height 5\' 3"  (1.6 m), weight 174 lb 3.2 oz (79 kg), SpO2 96 %. Body mass index is 30.86 kg/m. General: Well Developed, well nourished, appropriate for stated age.  Neuro: Alert and oriented x3, extra-ocular muscles intact, sensation grossly intact.  HEENT: Normocephalic, atraumatic, pupils equal round reactive to light, neck supple, no masses, no painful lymphadenopathy. Cerumen impaction bilaterally; post-irrigation by CMA, TM's intact B/L and WNL, no acute findings. Nares- patent, clear d/c, OP- clear, mild erythema, No TTP sinuses Skin: Warm and dry, no gross rash. Cardiac: RRR, S1 S2,  no murmurs rubs or gallops.  Respiratory: ECTA B/L and A/P, Not using accessory muscles, speaking in full sentences- unlabored. Vascular:  No gross lower ext edema, cap RF less 2 sec. Psych: No HI/SI, judgement and insight good, Euthymic mood. Full Affect.

## 2017-11-06 ENCOUNTER — Telehealth: Payer: Self-pay

## 2017-11-06 NOTE — Telephone Encounter (Signed)
Patient called and states that she had a cold abt 1 month ago and took over the counter cold meds and symptoms improved.  However, since that time patient states that she has lost all taste.  Patient is not complaining of anything and patient states that she has had no injuries or head trauma.  Advised patient per Mina Marble, NP to push water and for patient to taste test - to try to eat spicy, salty, sour, etc to see if she can test anything and to follow up with Dr. Raliegh Scarlet if symptoms continue x 1-2 wks longer.  Patient expressed understanding and will follow up as needed. MPulliam, CMA/RT(R)

## 2017-11-18 ENCOUNTER — Ambulatory Visit: Payer: Medicare HMO | Admitting: Neurology

## 2017-11-20 ENCOUNTER — Ambulatory Visit (INDEPENDENT_AMBULATORY_CARE_PROVIDER_SITE_OTHER): Payer: Medicare HMO | Admitting: Family Medicine

## 2017-11-20 ENCOUNTER — Other Ambulatory Visit: Payer: Self-pay

## 2017-11-20 ENCOUNTER — Ambulatory Visit: Payer: Medicare HMO

## 2017-11-20 ENCOUNTER — Encounter: Payer: Self-pay | Admitting: Family Medicine

## 2017-11-20 VITALS — BP 132/75 | HR 90 | Temp 98.7°F | Ht 63.0 in | Wt 174.4 lb

## 2017-11-20 DIAGNOSIS — J069 Acute upper respiratory infection, unspecified: Secondary | ICD-10-CM

## 2017-11-20 DIAGNOSIS — R05 Cough: Secondary | ICD-10-CM | POA: Diagnosis not present

## 2017-11-20 DIAGNOSIS — R0602 Shortness of breath: Secondary | ICD-10-CM | POA: Diagnosis not present

## 2017-11-20 DIAGNOSIS — R059 Cough, unspecified: Secondary | ICD-10-CM

## 2017-11-20 MED ORDER — PREDNISONE 10 MG PO TABS: ORAL_TABLET | ORAL | 0 refills | Status: DC

## 2017-11-20 MED ORDER — AMOXICILLIN-POT CLAVULANATE 875-125 MG PO TABS: 1.0000 | ORAL_TABLET | Freq: Two times a day (BID) | ORAL | 0 refills | Status: AC

## 2017-11-20 NOTE — Progress Notes (Signed)
Acute Care Office visit  Assessment and plan:  1. Cough   2. Shortness of breath   3. Upper respiratory tract infection, unspecified type     -Chest x-ray in office today- independently read and interpreted by me. Plan of care made with input from read of CXR.   It is within normal limits mild linear effusion between right lobes of lung otherwise no infiltrate or evidence of pneumonia, severe bronchitis etc.  This was independently reviewed by myself.  - meds per below. -Also discussed with patient she can continue her Delsym 12-hour over-the-counter cough medicine as well as Mucinex DM. -She will let us know if she is feeling worse and not better and she understands if she does not improve she will need to be reevaluated because this is not something we would anticipate would happen. - Viral vs Allergic vs Bacterial causes for pt's symptoms reveiwed.    - Supportive care and various OTC medications discussed in addition to any prescribed. - Call or RTC if new symptoms, or if no improvement or worse over next several days.      Meds ordered this encounter  Medications  . amoxicillin-clavulanate (AUGMENTIN) 875-125 MG tablet    Sig: Take 1 tablet by mouth 2 (two) times daily for 10 days.    Dispense:  20 tablet    Refill:  0  . predniSONE (DELTASONE) 10 MG tablet    Sig: Take 3 pills a day for 2 days, 2 pills a day for 2 days, 1 pill a day for 2 days then one half pill a day for 2 days then off    Dispense:  18 tablet    Refill:  0    Orders Placed This Encounter  Procedures  . DG Chest 2 View    Gross side effects, risk and benefits, and alternatives of medications discussed with patient.  Patient is aware that all medications have potential side effects and we are unable to predict every sideeffect or drug-drug interaction that may occur.  Expresses verbal understanding and consents to current therapy plan and treatment regiment.   Education and routine counseling  performed. Handouts provided.  Anticipatory guidance and routine counseling done re: condition, txmnt options and need for follow up. All questions of patient's were answered.  Return if symptoms worsen or fail to improve, for Please keep your chronic follow-up office visits in the near future.  Please see AVS handed out to patient at the end of our visit for additional patient instructions/ counseling done pertaining to today's office visit.  Note:  This document was partially repared using Dragon voice recognition software and may include unintentional dictation errors.  Mellody Dance 11/21/17 3:10 PM     Subjective:    Chief Complaint  Patient presents with  . Cough    HPI:  Pt presents with Sx for 30+  days   C/o: Sx going on a month now.   Started as head cold and now moved in to chest as well as head sx still- congestion, RN, no ST, no ear pain or face pains.   Some SOB at times- when pt thinks about her breathing, no wheezing.   No Fevers or chills.    Tried nyquil, alka seltzer plus.   - In future [pt has a f/up for:  Return for 6-8 wks OVw/FBW 3 d prior (d/c klonopin, added trazadone, [employer] Fishoil, dec lexapro,add zoloft).  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  Megan Salon, MD Consulting Physician Gynecology  11/14/16     Past medical history, Surgical history, Family history reviewed and noted below, Social history, Allergies, and Medications have been entered into the medical record, reviewed and changed as needed.   Allergies  Allergen Reactions  . Ciprofloxacin Other (See Comments)    Unknown (vertigo??)  . Citalopram Hydrobromide Other (See Comments)    Intrusive/ odd thoughts   . Flagyl [Metronidazole]  Other (See Comments)    Unknown??  . Oxycodone Other (See Comments)    Headaches    Review of Systems: - see above HPI for pertinent positives General:   No F/C, wt loss Pulm:   No DIB, pleuritic chest pain Card:  No CP, palpitations Abd:  No n/v/d or pain Ext:  No inc edema from baseline   Objective:   Blood pressure 132/75, pulse 90, temperature 98.7 F (37.1 C), height 5\' 3"  (1.6 m), weight 174 lb 6.4 oz (79.1 kg), SpO2 96 %. Body mass index is 30.89 kg/m. General: Well Developed, well nourished, appropriate for stated age.  Neuro: Alert and oriented x3, extra-ocular muscles intact, sensation grossly intact.  HEENT: Normocephalic, atraumatic, pupils equal round reactive to light, neck supple, no masses, no painful lymphadenopathy, TM's intact B/L, no acute findings. Nares- patent, clear d/c, OP- clear, mild erythema, No TTP sinuses Skin: Warm and dry, no gross rash. Cardiac: RRR, S1 S2,  no murmurs rubs or gallops.  Respiratory: ECTA B/L and A/P, Not using accessory muscles, speaking in full sentences- unlabored. Vascular:  No gross lower ext edema, cap RF less 2 sec. Psych: No HI/SI, judgement and insight good, Euthymic mood. Full Affect.

## 2017-11-20 NOTE — Patient Instructions (Signed)
Upper Respiratory Infection, Adult Most upper respiratory infections (URIs) are a viral infection of the air passages leading to the lungs. A URI affects the nose, throat, and upper air passages. The most common type of URI is nasopharyngitis and is typically referred to as "the common cold." URIs run their course and usually go away on their own. Most of the time, a URI does not require medical attention, but sometimes a bacterial infection in the upper airways can follow a viral infection. This is called a secondary infection. Sinus and middle ear infections are common types of secondary upper respiratory infections. Bacterial pneumonia can also complicate a URI. A URI can worsen asthma and chronic obstructive pulmonary disease (COPD). Sometimes, these complications can require emergency medical care and may be life threatening. What are the causes? Almost all URIs are caused by viruses. A virus is a type of germ and can spread from one person to another. What increases the risk? You may be at risk for a URI if:  You smoke.  You have chronic heart or lung disease.  You have a weakened defense (immune) system.  You are very young or very old.  You have nasal allergies or asthma.  You work in crowded or poorly ventilated areas.  You work in health care facilities or schools.  What are the signs or symptoms? Symptoms typically develop 2-3 days after you come in contact with a cold virus. Most viral URIs last 7-10 days. However, viral URIs from the influenza virus (flu virus) can last 14-18 days and are typically more severe. Symptoms may include:  Runny or stuffy (congested) nose.  Sneezing.  Cough.  Sore throat.  Headache.  Fatigue.  Fever.  Loss of appetite.  Pain in your forehead, behind your eyes, and over your cheekbones (sinus pain).  Muscle aches.  How is this diagnosed? Your health care provider may diagnose a URI by:  Physical exam.  Tests to check that your  symptoms are not due to another condition such as: ? Strep throat. ? Sinusitis. ? Pneumonia. ? Asthma.  How is this treated? A URI goes away on its own with time. It cannot be cured with medicines, but medicines may be prescribed or recommended to relieve symptoms. Medicines may help:  Reduce your fever.  Reduce your cough.  Relieve nasal congestion.  Follow these instructions at home:  Take medicines only as directed by your health care provider.  Gargle warm saltwater or take cough drops to comfort your throat as directed by your health care provider.  Use a warm mist humidifier or inhale steam from a shower to increase air moisture. This may make it easier to breathe.  Drink enough fluid to keep your urine clear or pale yellow.  Eat soups and other clear broths and maintain good nutrition.  Rest as needed.  Return to work when your temperature has returned to normal or as your health care provider advises. You may need to stay home longer to avoid infecting others. You can also use a face mask and careful hand washing to prevent spread of the virus.  Increase the usage of your inhaler if you have asthma.  Do not use any tobacco products, including cigarettes, chewing tobacco, or electronic cigarettes. If you need help quitting, ask your health care provider. How is this prevented? The best way to protect yourself from getting a cold is to practice good hygiene.  Avoid oral or hand contact with people with cold symptoms.  Wash your   hands often if contact occurs.  There is no clear evidence that vitamin C, vitamin E, echinacea, or exercise reduces the chance of developing a cold. However, it is always recommended to get plenty of rest, exercise, and practice good nutrition. Contact a health care provider if:  You are getting worse rather than better.  Your symptoms are not controlled by medicine.  You have chills.  You have worsening shortness of breath.  You have  brown or red mucus.  You have yellow or brown nasal discharge.  You have pain in your face, especially when you bend forward.  You have a fever.  You have swollen neck glands.  You have pain while swallowing.  You have white areas in the back of your throat. Get help right away if:  You have severe or persistent: ? Headache. ? Ear pain. ? Sinus pain. ? Chest pain.  You have chronic lung disease and any of the following: ? Wheezing. ? Prolonged cough. ? Coughing up blood. ? A change in your usual mucus.  You have a stiff neck.  You have changes in your: ? Vision. ? Hearing. ? Thinking. ? Mood. This information is not intended to replace advice given to you by your health care provider. Make sure you discuss any questions you have with your health care provider. Document Released: 08/15/2000 Document Revised: 10/23/2015 Document Reviewed: 05/27/2013 Elsevier Interactive Patient Education  2018 [employer]  

## 2017-11-26 ENCOUNTER — Other Ambulatory Visit (INDEPENDENT_AMBULATORY_CARE_PROVIDER_SITE_OTHER): Payer: Medicare HMO

## 2017-11-26 DIAGNOSIS — F5104 Psychophysiologic insomnia: Secondary | ICD-10-CM

## 2017-11-26 DIAGNOSIS — R7303 Prediabetes: Secondary | ICD-10-CM

## 2017-11-26 DIAGNOSIS — E7849 Other hyperlipidemia: Secondary | ICD-10-CM

## 2017-11-26 DIAGNOSIS — R413 Other amnesia: Secondary | ICD-10-CM

## 2017-11-26 DIAGNOSIS — F4323 Adjustment disorder with mixed anxiety and depressed mood: Secondary | ICD-10-CM

## 2017-11-26 DIAGNOSIS — F0150 Vascular dementia without behavioral disturbance: Secondary | ICD-10-CM

## 2017-11-26 DIAGNOSIS — F3289 Other specified depressive episodes: Secondary | ICD-10-CM

## 2017-11-27 LAB — COMPREHENSIVE METABOLIC PANEL
ALK PHOS: 112 IU/L (ref 39–117)
ALT: 20 IU/L (ref 0–32)
AST: 17 IU/L (ref 0–40)
Albumin/Globulin Ratio: 1.7 (ref 1.2–2.2)
Albumin: 4.3 g/dL (ref 3.5–4.8)
BUN / CREAT RATIO: 16 (ref 12–28)
BUN: 18 mg/dL (ref 8–27)
Bilirubin Total: 0.5 mg/dL (ref 0.0–1.2)
CHLORIDE: 98 mmol/L (ref 96–106)
CO2: 29 mmol/L (ref 20–29)
CREATININE: 1.12 mg/dL — AB (ref 0.57–1.00)
Calcium: 9.7 mg/dL (ref 8.7–10.3)
GFR calc Af Amer: 55 mL/min/{1.73_m2} — ABNORMAL LOW (ref >59)
GFR calc non Af Amer: 47 mL/min/{1.73_m2} — ABNORMAL LOW (ref >59)
Globulin, Total: 2.6 g/dL (ref 1.5–4.5)
Glucose: 78 mg/dL (ref 65–99)
Potassium: 4.9 mmol/L (ref 3.5–5.2)
Sodium: 143 mmol/L (ref 134–144)
Total Protein: 6.9 g/dL (ref 6.0–8.5)

## 2017-11-27 LAB — CBC
HEMATOCRIT: 45.1 % (ref 34.0–46.6)
HEMOGLOBIN: 15.2 g/dL (ref 11.1–15.9)
MCH: 30.9 pg (ref 26.6–33.0)
MCHC: 33.7 g/dL (ref 31.5–35.7)
MCV: 92 fL (ref 79–97)
Platelets: 316 10*3/uL (ref 150–450)
RBC: 4.92 x10E6/uL (ref 3.77–5.28)
RDW: 13.9 % (ref 12.3–15.4)
WBC: 11.1 10*3/uL — ABNORMAL HIGH (ref 3.4–10.8)

## 2017-11-27 LAB — SEDIMENTATION RATE: Sed Rate: 32 mm/hr (ref 0–40)

## 2017-11-27 LAB — LIPID PANEL
Chol/HDL Ratio: 3.8 ratio (ref 0.0–4.4)
Cholesterol, Total: 175 mg/dL (ref 100–199)
HDL: 46 mg/dL (ref >39)
LDL CALC: 92 mg/dL (ref 0–99)
Triglycerides: 185 mg/dL — ABNORMAL HIGH (ref 0–149)
VLDL CHOLESTEROL CAL: 37 mg/dL (ref 5–40)

## 2017-11-27 LAB — TSH: TSH: 10.14 u[IU]/mL — AB (ref 0.450–4.500)

## 2017-11-27 LAB — RPR: RPR: NONREACTIVE

## 2017-11-27 LAB — HEMOGLOBIN A1C
Est. average glucose Bld gHb Est-mCnc: 120 mg/dL
Hgb A1c MFr Bld: 5.8 % — ABNORMAL HIGH (ref 4.8–5.6)

## 2017-11-27 LAB — VITAMIN D 25 HYDROXY (VIT D DEFICIENCY, FRACTURES): VIT D 25 HYDROXY: 56.7 ng/mL (ref 30.0–100.0)

## 2017-11-27 LAB — T4, FREE: Free T4: 1.12 ng/dL (ref 0.82–1.77)

## 2017-11-27 LAB — VITAMIN B12: VITAMIN B 12: 946 pg/mL (ref 232–1245)

## 2017-11-27 LAB — FOLATE: Folate: >20 ng/mL (ref >3.0)

## 2017-11-28 ENCOUNTER — Encounter: Payer: Self-pay | Admitting: Family Medicine

## 2017-11-28 ENCOUNTER — Ambulatory Visit (INDEPENDENT_AMBULATORY_CARE_PROVIDER_SITE_OTHER): Payer: Medicare HMO | Admitting: Family Medicine

## 2017-11-28 VITALS — BP 107/72 | HR 91 | Ht 63.0 in | Wt 171.4 lb

## 2017-11-28 DIAGNOSIS — R05 Cough: Secondary | ICD-10-CM

## 2017-11-28 DIAGNOSIS — J069 Acute upper respiratory infection, unspecified: Secondary | ICD-10-CM

## 2017-11-28 DIAGNOSIS — J3089 Other allergic rhinitis: Secondary | ICD-10-CM | POA: Insufficient documentation

## 2017-11-28 DIAGNOSIS — N183 Chronic kidney disease, stage 3 unspecified: Secondary | ICD-10-CM | POA: Insufficient documentation

## 2017-11-28 DIAGNOSIS — R0982 Postnasal drip: Secondary | ICD-10-CM

## 2017-11-28 DIAGNOSIS — F4323 Adjustment disorder with mixed anxiety and depressed mood: Secondary | ICD-10-CM

## 2017-11-28 DIAGNOSIS — E7849 Other hyperlipidemia: Secondary | ICD-10-CM | POA: Diagnosis not present

## 2017-11-28 DIAGNOSIS — F5105 Insomnia due to other mental disorder: Secondary | ICD-10-CM

## 2017-11-28 DIAGNOSIS — E039 Hypothyroidism, unspecified: Secondary | ICD-10-CM | POA: Diagnosis not present

## 2017-11-28 DIAGNOSIS — R7303 Prediabetes: Secondary | ICD-10-CM

## 2017-11-28 DIAGNOSIS — R059 Cough, unspecified: Secondary | ICD-10-CM | POA: Insufficient documentation

## 2017-11-28 DIAGNOSIS — N1831 Chronic kidney disease, stage 3a: Secondary | ICD-10-CM

## 2017-11-28 DIAGNOSIS — E860 Dehydration: Secondary | ICD-10-CM | POA: Insufficient documentation

## 2017-11-28 DIAGNOSIS — E669 Obesity, unspecified: Secondary | ICD-10-CM

## 2017-11-28 MED ORDER — LEVOTHYROXINE SODIUM 75 MCG PO TABS: 75.0000 ug | ORAL_TABLET | Freq: Every day | ORAL | 1 refills | Status: DC

## 2017-11-28 NOTE — Patient Instructions (Addendum)
We will add on a T3 or another thyroid labs to your recent labs and you will hear from [place] if something needs to be changed or done.  As well, if you develop any symptoms of hypothyroidism, please let us know as well.  You will see below I gave you a handout on symptoms of hypothyroidism.   -If your cough worsens or isn't resolved after a month, contact us for next steps including a CT scan chest, if symptoms worsen or new symptoms, please follow-up in the office for evaluation  For the postnasal drip and feeling like something is in your throat please start doing the Neomed sinus rinse\AYR sinus rinses twice daily.  You can get this over-the-counter.   Also taking just the plain Allegra daily which you can get over-the-counter as well will help with the symptoms in the fall associated with environmental\ seasonal allergies.   Again, this can definitely be contributing to your cough as well.  For your sleep please try the trazodone at one half tab nightly.  Please stop Tylenol PM or anything with diphenhydramine\Benadryl as this can affect memory and older folks.  Please try to drink more water and less Pepsi.      -In 4 months we will recheck your BMP with GFR.  That we will recheck your kidney function for [place] so we can keep an eye on that.  As well we should check an A1c to see how the prediabetes is doing.     Please realize, EXERCISE IS MEDICINE!   -  American Heart Association [facility]) guidelines for exercise : If you are in good health, without any medical conditions, you should engage in 150 minutes of moderate intensity aerobic activity per week.  This means you should be huffing and puffing throughout your workout.   Engaging in regular exercise will improve brain function and memory, as well as improve mood, boost immune system and help with weight management.  As well as the other, more well-known effects of exercise such as decreasing blood sugar levels, decreasing blood pressure,  and  decreasing bad cholesterol levels/ increasing good cholesterol levels.     -  The AHA strongly endorses consumption of a diet that contains a variety of foods from all the food categories with an emphasis on fruits and vegetables; fat-free and low-fat dairy products; cereal and grain products; legumes and nuts; and fish, poultry, and/or extra lean meats.    Excessive food intake, especially of foods high in saturated and trans fats, sugar, and salt, should be avoided.    Adequate water intake of roughly 1/2 of your weight in pounds, should equal the ounces of water per day you should drink.  So for instance, if you're 200 pounds, that would be 100 ounces of water per day.         Mediterranean Diet  Why follow it? Research shows  Those who follow the Mediterranean diet have a reduced risk of heart disease   The diet is associated with a reduced incidence of Parkinson's and Alzheimer's diseases  People following the diet may have longer life expectancies and lower rates of chronic diseases   The Dietary Guidelines for Americans recommends the Mediterranean diet as an eating plan to promote health and prevent disease  What Is the Mediterranean Diet?   Healthy eating plan based on typical foods and recipes of Mediterranean-style cooking  The diet is primarily a plant based diet; these foods should make up a majority of meals  Starches - Plant based foods should make up a majority of meals - They are an important sources of vitamins, minerals, energy, antioxidants, and fiber - Choose whole grains, foods high in fiber and minimally processed items  - Typical grain sources include wheat, oats, barley, corn, brown rice, bulgar, farro, millet, polenta, couscous  - Various types of beans include chickpeas, lentils, fava beans, black beans, white beans   Fruits  Veggies - Large quantities of antioxidant rich fruits & veggies; 6 or more servings  - Vegetables can be eaten raw or lightly  drizzled with oil and cooked  - Vegetables common to the traditional Mediterranean Diet include: artichokes, arugula, beets, broccoli, brussel sprouts, cabbage, carrots, celery, collard greens, cucumbers, eggplant, kale, leeks, lemons, lettuce, mushrooms, okra, onions, peas, peppers, potatoes, pumpkin, radishes, rutabaga, shallots, spinach, sweet potatoes, turnips, zucchini - Fruits common to the Mediterranean Diet include: apples, apricots, avocados, cherries, clementines, dates, figs, grapefruits, grapes, melons, nectarines, oranges, peaches, pears, pomegranates, strawberries, tangerines  Fats - Replace butter and margarine with healthy oils, such as olive oil, canola oil, and tahini  - Limit nuts to no more than a handful a day  - Nuts include walnuts, almonds, pecans, pistachios, pine nuts  - Limit or avoid candied, honey roasted or heavily salted nuts - Olives are central to the [employer] - can be eaten whole or used in a variety of dishes   Meats Protein - Limiting red meat: no more than a few times a month - When eating red meat: choose lean cuts and keep the portion to the size of deck of cards - Eggs: approx. 0 to 4 times a week  - Fish and lean poultry: at least 2 a week  - Healthy protein sources include, chicken, [place], lean beef, lamb - Increase intake of seafood such as tuna, salmon, trout, mackerel, shrimp, scallops - Avoid or limit high fat processed meats such as sausage and bacon  Dairy - Include moderate amounts of low fat dairy products  - Focus on healthy dairy such as fat free yogurt, skim milk, low or reduced fat cheese - Limit dairy products higher in fat such as whole or 2% milk, cheese, ice cream  Alcohol - Moderate amounts of red wine is ok  - No more than 5 oz daily for women (all ages) and men older than age 77  - No more than 10 oz of wine daily for men younger than 44  Other - Limit sweets and other desserts  - Use herbs and spices instead of salt to  flavor foods  - Herbs and spices common to the traditional Mediterranean Diet include: basil, bay leaves, chives, cloves, cumin, fennel, garlic, lavender, marjoram, mint, oregano, parsley, pepper, rosemary, sage, savory, sumac, tarragon, thyme   Its not just a diet, its a lifestyle:   The Mediterranean diet includes lifestyle factors typical of those in the region   Foods, drinks and meals are best eaten with others and savored  Daily physical activity is important for overall good health  This could be strenuous exercise like running and aerobics  This could also be more leisurely activities such as walking, housework, yard-work, or taking the stairs  Moderation is the key; a balanced and healthy diet accommodates most foods and drinks  Consider portion sizes and frequency of consumption of certain foods   Meal Ideas & Options:   Breakfast:  o Whole wheat toast or whole wheat English muffins with peanut butter & hard boiled egg  o Steel cut oats topped with apples & cinnamon and skim milk  o Fresh fruit: banana, strawberries, melon, berries, peaches  o Smoothies: strawberries, bananas, greek yogurt, peanut butter o Low fat greek yogurt with blueberries and granola  o Egg white omelet with spinach and mushrooms o Breakfast couscous: whole wheat couscous, apricots, skim milk, cranberries   Sandwiches:  o Hummus and grilled vegetables (peppers, zucchini, squash) on whole wheat bread   o Grilled chicken on whole wheat pita with lettuce, tomatoes, cucumbers or tzatziki  o Tuna salad on whole wheat bread: tuna salad made with greek yogurt, olives, red peppers, capers, green onions o Garlic rosemary lamb pita: lamb sauted with garlic, rosemary, salt & pepper; add lettuce, cucumber, greek yogurt to pita - flavor with lemon juice and black pepper   Seafood:  o Mediterranean grilled salmon, seasoned with garlic, basil, parsley, lemon juice and black pepper o Shrimp, lemon, and spinach  whole-grain pasta salad made with low fat greek yogurt  o Seared scallops with lemon orzo  o Seared tuna steaks seasoned salt, pepper, coriander topped with tomato mixture of olives, tomatoes, olive oil, minced garlic, parsley, green onions and cappers   Meats:  o Herbed greek chicken salad with kalamata olives, cucumber, feta  o Red bell peppers stuffed with spinach, bulgur, lean ground beef (or lentils) & topped with feta   o Kebabs: skewers of chicken, tomatoes, onions, zucchini, squash  o [place] burgers: made with red onions, mint, dill, lemon juice, feta cheese topped with roasted red peppers  Vegetarian o Cucumber salad: cucumbers, artichoke hearts, celery, red onion, feta cheese, tossed in olive oil & lemon juice  o Hummus and whole grain pita points with a greek salad (lettuce, tomato, feta, olives, cucumbers, red onion) o Lentil soup with celery, carrots made with vegetable broth, garlic, salt and pepper  Tabouli salad: parsley, bulgur, mint, scallions, cucumbers, tomato, radishes, lemon juice, olive oil, salt and pepper.     Hypothyroidism Hypothyroidism is a disorder of the thyroid. The thyroid is a large gland that is located in the lower front of the neck. The thyroid releases hormones that control how the body works. With hypothyroidism, the thyroid does not make enough of these hormones. What are the causes? Causes of hypothyroidism may include:  Viral infections.  Pregnancy.  Your own defense system (immune system) attacking your thyroid.  Certain medicines.  Birth defects.  Past radiation treatments to your head or neck.  Past treatment with radioactive iodine.  Past surgical removal of part or all of your thyroid.  Problems with the gland that is located in the center of your brain (pituitary).  What are the signs or symptoms? Signs and symptoms of hypothyroidism may include:  Feeling as though you have no energy (lethargy).  Inability to tolerate  cold.  Weight gain that is not explained by a change in diet or exercise habits.  Dry skin.  Coarse hair.  Menstrual irregularity.  Slowing of thought processes.  Constipation.  Sadness or depression.  How is this diagnosed? Your health care provider may diagnose hypothyroidism with blood tests and ultrasound tests. How is this treated? Hypothyroidism is treated with medicine that replaces the hormones that your body does not make. After you begin treatment, it may take several weeks for symptoms to go away. Follow these instructions at home:  Take medicines only as directed by your health care provider.  If you start taking any new medicines, tell your health care provider.  Keep  all follow-up visits as directed by your health care provider. This is important. As your condition improves, your dosage needs may change. You will need to have blood tests regularly so that your health care provider can watch your condition. Contact a health care provider if:  Your symptoms do not get better with treatment.  You are taking thyroid replacement medicine and: ? You sweat excessively. ? You have tremors. ? You feel anxious. ? You lose weight rapidly. ? You cannot tolerate heat. ? You have emotional swings. ? You have diarrhea. ? You feel weak. Get help right away if:  You develop chest pain.  You develop an irregular heartbeat.  You develop a rapid heartbeat. This information is not intended to replace advice given to you by your health care provider. Make sure you discuss any questions you have with your health care provider. Document Released: 02/19/2005 Document Revised: 07/28/2015 Document Reviewed: 07/07/2013 Elsevier Interactive Patient Education  2018 [employer].

## 2017-11-28 NOTE — Progress Notes (Signed)
Assessment and plan:  1. Chronic kidney disease (CKD) stage G3a/A1, moderately decreased glomerular filtration rate (GFR) between 45-59 mL/min/1.73 square meter and albuminuria creatinine ratio less than 30 mg/g (HCC)   2. Mild dehydration   3. Hypothyroidism, unspecified type   4. Other hyperlipidemia   5. Adjustment disorder with mixed anxiety and depressed mood   6. Obesity, Class I, BMI 30-34.9   7. recent Upper respiratory tract infection, unspecified type   8. Cough -  2-3 mo    9. Environmental and seasonal allergies   10. PND (post-nasal drip)   11. Insomnia due to mental condition   12. Prediabetes    CKD -Discussed increasing Ser/Creatinine levels  -Discussed GFR levels -Discussed negative impact of advil, aleve and a lot of soda on kidney health and function -Discussed the positive impact of hydration and exercise on kidney function  -Discussed requirements of CKD diagnosis  Hypothyroidism -Ordered T3 testing -Discussed role of TSH in signaling thyroid function -Discussed levels of T3 and T4 in response to TSH and thyroid function -Discussed symptoms of hypothyroidism including losing hair and extra fatigue -Educated pt about possible additional medications to manage hypothyroidism -Educated pt about role of T3 in determining need for additional medication -Discussed the role of stress on thyroid hormones  HLD -Discussed LDL levels and negative impact on cardiovascular health -Discussed importance of HDL and ways to improve levels -Discussed triglyceride levels ways to improve -Discussed impact of cholesterol medications on liver function -educated pt on impact of diet and exercise in controlling cholesterol levels  Mood -Sx stable; continue meds -Discussed negative impact of poor sleep on mood -Discussed possible positive impact of trazodone on improving mood  Sleep -Discussed negative impact  of tylenol PM on memory with long term use -Discussed negative impact of tylenol PM on liver -Educated pt on benefits of trazodone for sleep and some benefits for mood  Recent URI cough -Educated pt that it would take roughly a month for cough to reside -Discussed impact of post nasal drip on throat irritation, cough and tasting -Instructed pt to contact us if the cough worsens or continues beyond the next month -Discussed next steps in treatment if cough doesn't improve or go away  Seasonal Allergies -Encouraged pt to consider using sinus rinses to help diminish post-nasal drip -Discussed impact of allergies on post-nasal drip and throat irritation  Obesity -Encouraged pt to increase activity levels -Discussed positive impacts of exercise on organ function and mobility -Discussed positive impact of exercise on mood and memory -Discussed positive impact of weight loss on prediabetes and  HLD  Lab results -Discussed liver enzymes and impact of medications on ALT and AST -Discussed Vitamin D levels  -Discussed cholesterol levels -Discussed A1c levels and indication for prediabetes -Discussed Sedimentation results  -Discussed CBC results -Discussed RPR results   Follow up -Recheck Ser/Creatinine, GFR in 4 months  Pt was in the office today for 32.5+ minutes, with over 50% time spent in face to face counseling of patients various medical conditions, treatment plans of those medical conditions including medicine management and lifestyle modification, strategies to improve health and well being; and in coordination of care. SEE ABOVE TREATMENT PLAN FOR DETAILS    Education and routine counseling performed. Handouts provided.  Orders Placed This Encounter  Procedures  . Basic metabolic panel  . Hemoglobin A1c  . TSH+T4F+T3Free    Meds ordered this encounter  Medications  . levothyroxine (SYNTHROID, LEVOTHROID) 75 MCG tablet  Sig: Take 1 tablet (75 mcg total) by mouth daily.     Dispense:  90 tablet    Refill:  1    Please consider 90 day supplies to promote better adherence    Medications Discontinued During This Encounter  Medication Reason  . traZODone (DESYREL) 50 MG tablet Patient Preference  . levothyroxine (SYNTHROID, LEVOTHROID) 75 MCG tablet Reorder      Return for 4 months, lab work 3-4-day prior then OV with me for follow-up CKD, Hypothyr, prediabetes, mood.   Anticipatory guidance and routine counseling done re: condition, txmnt options and need for follow up. All questions of patient's were answered.   Gross side effects, risk and benefits, and alternatives of medications discussed with patient.  Patient is aware that all medications have potential side effects and we are unable to predict every sideeffect or drug-drug interaction that may occur.  Expresses verbal understanding and consents to current therapy plan and treatment regiment.  Please see AVS handed out to patient at the end of our visit for additional patient instructions/ counseling done pertaining to today's office visit.  Note:  This document was prepared using Dragon voice recognition software and may include unintentional dictation errors.    This document serves as a record of services personally performed by Mellody Dance, MD. It was created on her behalf by Georga Bora, a trained medical scribe. The creation of this record is based on the scribe's personal observations and the provider's statements to them.   I have reviewed the above medical documentation for accuracy and completeness and I concur.  Mellody Dance 12/18/17 1:18 PM   ----------------------------------------------------------------------------------------------------------------------  Subjective:   CC:   Frances Hoffman is a 77 y.o. female who presents to [place] at [facility] today for review and discussion of recent bloodwork that was done in addition to f/up on chronic  conditions we are managing for pt.  All recent blood work that we ordered was reviewed with patient today.  Patient was counseled on all abnormalities and we discussed dietary and lifestyle changes that could help those values (also medications when appropriate).  Extensive health counseling performed and all patient's concerns/ questions were addressed.  See labs below and also plan for more details of these abnormalities  Cough -Pt states her cough is still present and she feels like "there's something in my throat I cant get up" -States her tongue feels "like its been scraped" and like her taste is decreased  allergies -States she does feel like her nose is dripping and has some congestion -Has noticed a recent increase in symptoms and believes its due to the time of year  Sleep -Pt decided to stop taking trazodone because "she read about some side effects" -Pt states she went back to taking Tylenol PM every night instead -Says she's been taking more tylenol than usual  Mood -States she has had good mood control lately -believes her medications are working  GAD 7 : Generalized Anxiety Score 11/28/2017 07/12/2016 03/15/2016  Nervous, Anxious, on Edge 0 1 1  Control/stop worrying _0 Worry too much - different things _1 Trouble relaxing 0 0 0  Restless 0 0 0  Easily annoyed or irritable _2 Afraid - awful might happen 0 1 1  Total GAD 7 Score _3 Anxiety Difficulty Not difficult at all Not difficult at all Not difficult at all    Depression screen [facility] 2/9 11/28/2017  10/10/2017 09/27/2017 03/26/2017 01/09/2017  Decreased Interest 1 0 _0 Down, Depressed, Hopeless _1 PHQ - 2 Score _2 Altered sleeping _3 Tired, decreased energy 1 0 _4 Change in appetite 2 0 0 0 0  Feeling bad or failure about yourself  0 _5 Trouble concentrating _6 0  Moving slowly or fidgety/restless 0 0 0 0 1  Suicidal thoughts 0 0 0 0 0  PHQ-9 Score _7 Difficult doing work/chores Not difficult at all - - Not difficult at all Somewhat difficult  Some recent data might be hidden     Lab Results 11/26/2017 Kidney Ser/Creatinine 1.12 GFR 47  Vitamin D  -level 56.7   A1C 5.8  Vitamin B12  - level 946  Cholesterol Lab Results  Component Value Date   CHOL 175 11/26/2017   HDL 46 11/26/2017   LDLCALC 92 11/26/2017   LDLDIRECT 92.0 04/20/2014   TRIG 185 (H) 11/26/2017   CHOLHDL 3.8 11/26/2017    Hepatic Function Latest Ref Rng & Units 11/26/2017 05/30/2017 02/10/2016  Total Protein 6.0 - 8.5 g/dL 6.9 7.0 6.9  Albumin 3.5 - 4.8 g/dL 4.3 4.1 4.1  AST 0 - 40 IU/L _8 ALT 0 - 32 IU/L _9 Alk Phosphatase 39 - 117 IU/L 112 107 89  Total Bilirubin 0.0 - 1.2 mg/dL 0.5 0.6 0.7  Bilirubin, Direct 0.0 - 0.3 mg/dL - - -         Wt Readings from Last 3 Encounters:  11/28/17 171 lb 6.4 oz (77.7 kg)  11/20/17 174 lb 6.4 oz (79.1 kg)  10/10/17 174 lb 3.2 oz (79 kg)   BP Readings from Last 3 Encounters:  11/28/17 107/72  11/20/17 132/75  10/10/17 107/73   Pulse Readings from Last 3 Encounters:  11/28/17 91  11/20/17 90  10/10/17 81   BMI Readings from Last 3 Encounters:  11/28/17 30.36 kg/m  11/20/17 30.89 kg/m  10/10/17 30.86 kg/m     Patient Care Team    Relationship Specialty Notifications Start End  Mellody Dance, DO PCP - General Family Medicine  08/24/15   Barrett, Evelene Croon, PA-C Physician Assistant Cardiology  01/08/16    Comment: 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  Megan Salon, MD Consulting Physician Gynecology  11/14/16     Full medical history updated and reviewed in the office today  Patient Active Problem List   Diagnosis Date Noted  . Breast cancer ([place])- L breast 2000 11/14/2016    Priority: High  . Hyperlipidemia 12/12/2007    Priority: High  . Adjustment disorder with mixed anxiety and depressed mood 12/12/2007    Priority: High  . Depression 12/23/2015    Priority:  Medium  . Overweight 03/15/2015    Priority: Medium  . Insomnia- due to stress 01/16/2011    Priority: Medium  . Hypothyroidism 12/12/2007    Priority: Medium  . Chronic GERD 12/12/2007    Priority: Medium  . Leg cramping 02/10/2016    Priority: Low  . Asx Hypotension 09/25/2015    Priority: Low  . Fatigue 08/15/2009    Priority: Low  . Osteopenia 11/26/2008    Priority: Low  . Chronic kidney disease (CKD) stage G3a/A1, moderately decreased glomerular filtration rate (GFR) between 45-59 mL/min/1.73 square meter and albuminuria creatinine ratio  less than 30 mg/g ([place]) 11/28/2017  . Mild dehydration 11/28/2017  . Cough -  2-3 mo  11/28/2017  . Environmental and seasonal allergies 11/28/2017  . Hearing loss and HA due to cerumen impaction, bilateral 10/10/2017  . Nonintractable headache 10/10/2017  . Vascular dementia without behavioral disturbance ([place]) 09/27/2017  . Memory difficulties 09/27/2017  . Prediabetes 09/27/2017  . Mild cognitive impairment 07/02/2017  . Cerebral vascular disease 07/02/2017  . Chronic insomnia 07/02/2017  . Primary osteoarthritis of first carpometacarpal joint of left hand 01/09/2017  . Obesity, Class I, BMI 30-34.9 08/19/2016  . 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 Chest pain 10/26/2015  . Fecal incontinence 07/29/2015  . Heme positive stool 07/29/2015  . Leg pain 06/06/2015  . Routine general medical examination at a health care facility 04/24/2015  . Memory loss 09/22/2014  . Exercise intolerance 07/30/2014  . Estrogen deficiency 04/28/2014  . Tachycardia 04/03/2014  . Family history of heart disease 08/27/2013  . Dyspnea on exertion 08/18/2013  . Positional vertigo 08/18/2013  . Encounter for Medicare annual wellness exam 11/23/2011  . Other screening mammogram 11/23/2011  . Left sided abdominal sub-Q tissue pain of unknown cause/ ( dx as L sided pelvic pain in past) 12/13/2009  . WEIGHT GAIN 01/15/2008  . ADHESIVE CAPSULITIS  12/12/2007    Past Medical History:  Diagnosis Date  . Adjustment disorder with mixed anxiety and depressed mood 12/12/2007   Qualifier: Diagnosis of  By: Copland MD, Frederico Hamman    . Cancer ([place]) 2000   breast had lumpectomy/radiation x36,mammo ,no chemo  . Cataract of both eyes   . Depression   . Dysuria-frequency syndrome    takes AZO  . Frozen shoulder   . Gall stones   . GERD 12/12/2007   Qualifier: Diagnosis of  By: Copland MD, Frederico Hamman    . Hyperlipidemia   . Hypothyroidism 12/12/2007   Qualifier: Diagnosis of  By: Copland MD, Frederico Hamman    . Memory loss   . Osteopenia    BMD 2004, WNL 2008  . Personal history of radiation therapy   . Pneumonia   . Recurrent UTI   . Shingles    chronic body pain, left side of body  . Shortness of breath dyspnea    when climbing stairs only  . Wears glasses     Past Surgical History:  Procedure Laterality Date  . BREAST LUMPECTOMY Left 2000  . CHOLECYSTECTOMY N/A 02/21/2015   Procedure: LAPAROSCOPIC CHOLECYSTECTOMY WITH INTRAOPERATIVE CHOLANGIOGRAM;  Surgeon: Greer Pickerel, MD;  Location: [facility];  Service: General;  Laterality: N/A;  . COLONOSCOPY    . MULTIPLE TOOTH EXTRACTIONS      Social History   Tobacco Use  . Smoking status: Never Smoker  . Smokeless tobacco: Never Used  Substance Use Topics  . Alcohol use: No    Alcohol/week: 0.0 standard drinks    Family Hx: Family History  Problem Relation Age of Onset  . Osteoporosis Mother   . Parkinsonism Father   . Cancer Sister        breast CA  . Breast cancer Sister 55  . Cancer Other        breast CA  . Coronary artery disease Sister   . Breast cancer Sister   . Heart disease Maternal Grandmother   . Heart disease Maternal Grandfather      Medications: Current Outpatient Medications  Medication Sig Dispense Refill  . aspirin 81 MG tablet Take 81 mg by mouth daily.    [name]  atorvastatin (LIPITOR) 20 MG tablet Take 1 tablet (20 mg total) by mouth at bedtime. 90 tablet 3  .  b complex vitamins tablet Take 1 tablet by mouth daily.    . calcium carbonate (OS-CAL - DOSED IN MG OF ELEMENTAL CALCIUM) 1250 (500 CA) MG tablet Take 1 tablet by mouth daily.    . carboxymethylcellulose (REFRESH PLUS) 0.5 % SOLN Place 1 drop into both eyes 3 (three) times daily as needed (for dryness).    . Cholecalciferol (VITAMIN D3) 2000 UNITS capsule Take 2,000 Units by mouth daily.      . Cranberry 125 MG TABS Take 2 tablets by mouth.    . diphenhydrAMINE-APAP, sleep, (TYLENOL PM EXTRA STRENGTH PO) Take by mouth as needed.    . donepezil (ARICEPT) 10 MG tablet Take 1 tablet (10 mg total) by mouth at bedtime. 30 tablet 11  . levothyroxine (SYNTHROID, LEVOTHROID) 75 MCG tablet Take 1 tablet (75 mcg total) by mouth daily. 90 tablet 1  . memantine (NAMENDA) 10 MG tablet Take 1 tablet (10 mg total) by mouth 2 (two) times daily. 60 tablet 11  . Multiple Vitamin (MULTIVITAMIN) tablet Take 1 tablet by mouth daily.      . Omega-3 Fatty Acids (FISH OIL PO) Take 2 capsules by mouth 2 (two) times daily.    [name] omeprazole (PRILOSEC OTC) 20 MG tablet Take 1 tablet (20 mg total) by mouth daily. 90 tablet 3  . predniSONE (DELTASONE) 10 MG tablet Take 3 pills a day for 2 days, 2 pills a day for 2 days, 1 pill a day for 2 days then one half pill a day for 2 days then off 18 tablet 0  . sertraline (ZOLOFT) 50 MG tablet 1/2 tablet daily for 1 week then 1 tablet daily thereafter 90 tablet 0  . vitamin C (ASCORBIC ACID) 500 MG tablet Take 500 mg by mouth daily.    [name] escitalopram (LEXAPRO) 20 MG tablet TAKE 1 TABLET BY MOUTH DAILY 90 tablet 1  . liothyronine (CYTOMEL) 5 MCG tablet Take 1 tablet (5 mcg total) by mouth daily. 90 tablet 0   No current facility-administered medications for this visit.     Allergies:  Allergies  Allergen Reactions  . Ciprofloxacin Other (See Comments)    Unknown (vertigo??)  . Citalopram Hydrobromide Other (See Comments)    Intrusive/ odd thoughts   . Flagyl [Metronidazole]  Other (See Comments)    Unknown??  . Oxycodone Other (See Comments)    Headaches     Review of Systems: General:   No F/C, wt loss Pulm:   No DIB, SOB, pleuritic chest pain Card:  No CP, palpitations Abd:  No n/v/d or pain Ext:  No inc edema from baseline  Objective:  Blood pressure 107/72, pulse 91, height _0  (1.6 m), weight 171 lb 6.4 oz (77.7 kg), SpO2 97 %. Body mass index is 30.36 kg/m. Gen:   Well NAD, A and O *3 HEENT:    [place]/AT, EOMI,  MMM Lungs:   Normal work of breathing. CTA B/L, no Wh, rhonchi Heart:   RRR, S1, S2 WNL's, no MRG Abd:   No gross distention Exts:    warm, pink,  Brisk capillary refill, warm and well perfused.  Psych:    No HI/SI, judgement and insight good, Euthymic mood. Full Affect.   Recent Results (from the past 2160 hour(s))  POCT urinalysis dipstick     Status: Normal   Collection Time: 10/10/17  3:08 PM  Result  Value Ref Range   Color, UA yellow    Clarity, UA clear    Glucose, UA Negative Negative   Bilirubin, UA negative    Ketones, UA negative    Spec Grav, UA 1.020 1.010 - 1.025   Blood, UA negative    pH, UA 7.0 5.0 - 8.0   Protein, UA Negative Negative   Urobilinogen, UA 0.2 0.2 or 1.0 E.U./dL   Nitrite, UA negative    Leukocytes, UA Negative Negative   Appearance     Odor    VITAMIN D 25 Hydroxy (Vit-D Deficiency, Fractures)     Status: None   Collection Time: 11/26/17  9:11 AM  Result Value Ref Range   Vit D, 25-Hydroxy 56.7 30.0 - 100.0 ng/mL    Comment: Vitamin D deficiency has been defined by the Institute of Medicine and an Endocrine Society practice guideline as a level of serum 25-OH vitamin D less than 20 ng/mL (1,2). The Endocrine Society went on to further define vitamin D insufficiency as a level between 21 and 29 ng/mL (2). 1. IOM (Institute of Medicine). 2010. Dietary reference    intakes for calcium and D. [place]: The    [employer]. 2. Holick MF, Binkley [place], Bischoff-Ferrari HA, et  al.    Evaluation, treatment, and prevention of vitamin D    deficiency: an Endocrine Society clinical practice    guideline. JCEM. 2011 Jul; 96(7):1911-30.   Lipid panel     Status: Abnormal   Collection Time: 11/26/17  9:11 AM  Result Value Ref Range   Cholesterol, Total 175 100 - 199 mg/dL   Triglycerides 185 (H) 0 - 149 mg/dL   HDL 46 >39 mg/dL   VLDL Cholesterol Cal 37 5 - 40 mg/dL   LDL Calculated 92 0 - 99 mg/dL   Chol/HDL Ratio 3.8 0.0 - 4.4 ratio    Comment:                                   T. Chol/HDL Ratio                                             Men  Women                               1/2 Avg.Risk  3.4    3.3                                   Avg.Risk  5.0    4.4                                2X Avg.Risk  9.6    7.1                                3X Avg.Risk 23.4   11.0   Hemoglobin A1c     Status: Abnormal   Collection Time: 11/26/17  9:11 AM  Result Value Ref Range   Hgb A1c MFr Bld 5.8 (H) 4.8 - 5.6 %  Comment:          Prediabetes: 5.7 - 6.4          Diabetes: >6.4          Glycemic control for adults with diabetes: <7.0    Est. average glucose Bld gHb Est-mCnc 120 mg/dL  T4, free     Status: None   Collection Time: 11/26/17  9:11 AM  Result Value Ref Range   Free T4 1.12 0.82 - 1.77 ng/dL  Folate     Status: None   Collection Time: 11/26/17  9:11 AM  Result Value Ref Range   Folate >20.0 >3.0 ng/mL    Comment: A serum folate concentration of less than 3.1 ng/mL is considered to represent clinical deficiency.   TSH     Status: Abnormal   Collection Time: 11/26/17  9:11 AM  Result Value Ref Range   TSH 10.140 (H) 0.450 - 4.500 uIU/mL  CBC     Status: Abnormal   Collection Time: 11/26/17  9:11 AM  Result Value Ref Range   WBC 11.1 (H) 3.4 - 10.8 x10E3/uL   RBC 4.92 3.77 - 5.28 x10E6/uL   Hemoglobin 15.2 11.1 - 15.9 g/dL   Hematocrit 45.1 34.0 - 46.6 %   MCV 92 79 - 97 fL   MCH 30.9 26.6 - 33.0 pg   MCHC 33.7 31.5 - 35.7 g/dL   RDW 13.9 12.3  - 15.4 %   Platelets 316 150 - 450 x10E3/uL  Sedimentation rate     Status: None   Collection Time: 11/26/17  9:11 AM  Result Value Ref Range   Sed Rate 32 0 - 40 mm/hr  Vitamin B12     Status: None   Collection Time: 11/26/17  9:11 AM  Result Value Ref Range   Vitamin B-12 946 232 - 1,245 pg/mL  RPR     Status: None   Collection Time: 11/26/17  9:11 AM  Result Value Ref Range   RPR Ser Ql Non Reactive Non Reactive  Comprehensive metabolic panel     Status: Abnormal   Collection Time: 11/26/17  9:11 AM  Result Value Ref Range   Glucose 78 65 - 99 mg/dL   BUN 18 8 - 27 mg/dL   Creatinine, Ser 1.12 (H) 0.57 - 1.00 mg/dL   GFR calc non Af Amer 47 (L) >59 mL/min/1.73   GFR calc Af Amer 55 (L) >59 mL/min/1.73   BUN/Creatinine Ratio 16 12 - 28   Sodium 143 134 - 144 mmol/L   Potassium 4.9 3.5 - 5.2 mmol/L   Chloride 98 96 - 106 mmol/L   CO2 29 20 - 29 mmol/L   Calcium 9.7 8.7 - 10.3 mg/dL   Total Protein 6.9 6.0 - 8.5 g/dL   Albumin 4.3 3.5 - 4.8 g/dL   Globulin, Total 2.6 1.5 - 4.5 g/dL   Albumin/Globulin Ratio 1.7 1.2 - 2.2   Bilirubin Total 0.5 0.0 - 1.2 mg/dL   Alkaline Phosphatase 112 39 - 117 IU/L   AST 17 0 - 40 IU/L   ALT 20 0 - 32 IU/L  T3, free     Status: Abnormal   Collection Time: 11/26/17  9:11 AM  Result Value Ref Range   T3, Free 1.6 (L) 2.0 - 4.4 pg/mL  Specimen status report     Status: None   Collection Time: 11/26/17  9:11 AM  Result Value Ref Range   specimen status report Comment     Comment: Written  Authorization Written Authorization Written Authorization Received. Authorization received from [place] 11-29-2017 Logged by Trudi Ida

## 2017-11-29 LAB — SPECIMEN STATUS REPORT

## 2017-11-29 LAB — T3, FREE: T3, Free: 1.6 pg/mL — ABNORMAL LOW (ref 2.0–4.4)

## 2017-12-02 ENCOUNTER — Other Ambulatory Visit: Payer: Self-pay | Admitting: Family Medicine

## 2017-12-02 DIAGNOSIS — R7989 Other specified abnormal findings of blood chemistry: Secondary | ICD-10-CM

## 2017-12-02 MED ORDER — LIOTHYRONINE SODIUM 5 MCG PO TABS: 5.0000 ug | ORAL_TABLET | Freq: Every day | ORAL | 0 refills | Status: DC

## 2017-12-02 NOTE — Progress Notes (Signed)
Call and tell pt that her t3 level low.  We will add cytomel to her regimen.  SHE will need repeat labs in 6-8wks- TSH, Free T3 and Free T4.  Please place these labs for future. thnx  If pt has any additional concerns / Q's even after we recently discussed this, let me know.

## 2017-12-04 ENCOUNTER — Other Ambulatory Visit: Payer: Self-pay

## 2017-12-04 DIAGNOSIS — E039 Hypothyroidism, unspecified: Secondary | ICD-10-CM

## 2017-12-04 DIAGNOSIS — R7989 Other specified abnormal findings of blood chemistry: Secondary | ICD-10-CM

## 2017-12-04 NOTE — Progress Notes (Signed)
Error. MPulliam, CMA/RT(R)  

## 2017-12-15 ENCOUNTER — Other Ambulatory Visit: Payer: Self-pay | Admitting: Family Medicine

## 2017-12-15 DIAGNOSIS — F3289 Other specified depressive episodes: Secondary | ICD-10-CM

## 2017-12-15 DIAGNOSIS — F4323 Adjustment disorder with mixed anxiety and depressed mood: Secondary | ICD-10-CM

## 2017-12-18 ENCOUNTER — Telehealth: Payer: Self-pay | Admitting: Family Medicine

## 2017-12-18 NOTE — Telephone Encounter (Signed)
Patient called states was here on 9/26 for cold symptoms w/ cough, cold is getting better but still has aggravating cough-- Patient wishes provider to Advise on what to take  OTC or if an Rx can be called in.  -Forwarding message to medical assistant for review w/ provider.  --glh

## 2017-12-18 NOTE — Telephone Encounter (Signed)
Spoke to the patient, she states that she is still having a dry cough since 11/18/2017 all other symptoms have improved. Frances Hoffman  LOV 11/28/2017  Please review and advise. MPulliam, CMA/RT(R)

## 2017-12-19 NOTE — Telephone Encounter (Signed)
Patient was actually last seen for her cold/URI symptoms on 9\18 not 9\26 or 9\16.    -If patient is still having symptoms which are now it lasted more than a month and a half per the notes, I recommend she come in and be seen so I can listen to her chest etc.  She was not wheezing nor did I appreciate any abnormalities on exam last time I listened however she should be reevaluated if she still having symptoms.  Please reassure her we did get a chest x-ray which was negative as well.

## 2017-12-19 NOTE — Telephone Encounter (Signed)
Appt made for patient 12/20/2017. MPulliam, CMA/RT(R)

## 2017-12-20 ENCOUNTER — Encounter: Payer: Self-pay | Admitting: Family Medicine

## 2017-12-20 ENCOUNTER — Ambulatory Visit (INDEPENDENT_AMBULATORY_CARE_PROVIDER_SITE_OTHER): Payer: Medicare HMO | Admitting: Family Medicine

## 2017-12-20 VITALS — BP 103/70 | HR 87 | Ht 63.0 in | Wt 172.3 lb

## 2017-12-20 DIAGNOSIS — K219 Gastro-esophageal reflux disease without esophagitis: Secondary | ICD-10-CM

## 2017-12-20 DIAGNOSIS — J329 Chronic sinusitis, unspecified: Secondary | ICD-10-CM | POA: Diagnosis not present

## 2017-12-20 DIAGNOSIS — R0982 Postnasal drip: Secondary | ICD-10-CM

## 2017-12-20 DIAGNOSIS — R053 Chronic cough: Secondary | ICD-10-CM | POA: Insufficient documentation

## 2017-12-20 DIAGNOSIS — R05 Cough: Secondary | ICD-10-CM

## 2017-12-20 DIAGNOSIS — Z5181 Encounter for therapeutic drug level monitoring: Secondary | ICD-10-CM

## 2017-12-20 MED ORDER — FLUTICASONE PROPIONATE 50 MCG/ACT NA SUSP: NASAL | 6 refills | Status: DC

## 2017-12-20 MED ORDER — SERTRALINE HCL 50 MG PO TABS: ORAL_TABLET | ORAL | 1 refills | Status: DC

## 2017-12-20 MED ORDER — RANITIDINE HCL 300 MG PO TABS: 150.0000 mg | ORAL_TABLET | Freq: Two times a day (BID) | ORAL | 3 refills | Status: DC

## 2017-12-20 NOTE — Patient Instructions (Addendum)
Please take your ranitidine\Zantac every day twice daily.  Also, please avoid any citrus foods, spicy foods or fried foods as this will make your reflux worse and possibly the chronic dry cough.  Also on a daily basis please take the Neomed sinus rinse or a Y are sinus rinses and do flushing of each nostril in the morning and night followed by 1 spray Flonase each nostril twice daily after the flushes.  Additionally, please take an over-the-counter Allegra every day.  If your chronic dry cough persists beyond a month even though you have taken the above treatment methods and lifestyle modifications, then we will send you to ear nose and throat or GI for further evaluation of a chronic dry cough.    The chronic cough Everyone coughs, and nobody worries about an occasional cough. Many acute illnesses - ranging from hay fever and the common cold to bronchitis and pneumonia - produce recurrent coughs. But the cough that accompanies acute illnesses resolves in a matter of a few days to a few weeks. In contrast, a chronic cough is variously defined as one that lingers for more than three to eight weeks, sometimes lasting for months or even years.  Chronic coughing is common, so frequent that it rates as one of the most common reasons for seeing a doctor. Although both patients and doctors rightly focus their attention on finding the cough's cause, the cough itself is responsible for significant problems.  In addition to worry about the diagnosis, patients experience frustration and anxiety, especially if diagnosis and treatment stretches out over weeks, which is often the case.  Coughing interrupts sleep, producing fatigue and impairing concentration and work performance.  In this age of scary new viruses, social interactions are likely to suffer.  And coughing can also have important physical consequences, ranging from urinary incontinence to fainting and broken ribs.  Between medical tests, lost productivity  at work, remedies that don't help, and treatments that do, coughing is also expensive.   What causes chronic coughing? Smoking is the leading cause. Sooner or later, most cigarette smokers develop a chronic "smoker's cough." Chemical irritation is responsible - but the same noxious chemicals that cause the simple smoker's cough can lead to far more serious conditions, such as bronchitis, emphysema, pneumonia, and lung cancer. The chronic cough is always a cause of concern for smokers.  A lingering cough is also a worry for nonsmokers. Fortunately, benign problems are responsible for most chronic coughs in nonsmokers. Benign or not, persistent coughing can cause worry, embarrassment, exhaustion, and more. That's why chronic coughs should be diagnosed and treated before they linger too long.  Dozens of conditions can cause a recurrent, lingering cough, but the lion's share are caused by just five: postnasal drip, asthma, gastroesophageal reflux disease (GERD), chronic bronchitis, and treatment with ACE inhibitors, used for high blood pressure. Many people have several of these conditions, but in nonsmokers, the first three, singly or in combination, account for nearly all chronic coughs. The major causes of long-term coughing are listed below.   Persistent cough: Major causes   (Common causes of a nagging cough)  Postnasal drip  Asthma and other airborne environmental irritants  Gastroesophageal reflux disease  Chronic bronchitis; bronchiectasis  Treatment with ACE inhibitors    (Less common causes of a nagging cough)  Cough is common in smokers- Tobacco smoke itself    Aspiration during swallowing  Heart failure  Lung infections  Pertussis (whooping cough)  Lung cancer  Other lung diseases  Psychological disorders  Lung cancer  Lung infections    Doctors can perform a variety of tests to diagnose patients with chronic coughs. The tests are accurate and successful,  but aside from a simple chest x-ray, testing is usually not necessary. That's because people can often figure things out for themselves. But before you attempt to diagnose and treat yourself, review the red flags that call for prompt medical attention instead of a do-it-yourself approach (below).  If you're like most people with a lingering cough, consider these major causes:  1. Postnasal drip (also called the upper airway cough syndrome).  The human nose is more than the organ of smell. It is also the gateway to the lower respiratory tract. As such, its job is to condition the air passing through en route to the lungs. The nose warms air that is cool, adds moisture to air that is dry, and removes particles from air that is dirty. The nasal membranes accomplish all three tasks by producing mucus that is warm, moist, and sticky.  Although the nose is a guardian of the more delicate lungs, it is subject to problems of its own. Viruses, allergies, sinusitis, dust particles, and airborne chemicals can all irritate the nasal membranes. The membranes respond to injury by producing more mucus - and unlike normal mucus, it's thin, watery, and runny.  All that mucus has to go somewhere. When it drips out the nose, it's a nuisance. But when it drips down the throat, it tickles the nerves of the nasopharynx, triggering a cough. In some cases, the nose itself is to blame (rhinitis), but in others, a prolonged postnasal drip lingers after a viral upper respiratory infection; some call this variety a post-infectious cough.  In typical cases, patients with postnasal drip cough more at night, and they are often aware of a tickling feeling at the back of their throats. But they can cough during the day, and their throats may be irritated and sore or perfectly fine.  The best way to find out if a chronic cough is the result of postnasal drip is to try treatment. Nonprescription decongestant or antihistamine tablets are  the first step. Most contain a decongestant such as pseudoephedrine or phenylephrine, an antihistamine such as chlorpheniramine or diphenhydramine, or a combination of the two. In one form or another, these medications are generally effective and safe, but some people complain of a racing heart and souped-up feeling (due to the decongestant), while others feel sleepy (due to the antihistamine). Men with benign prostatic hyperplasia (BPH) may have difficulty passing urine while they're taking decongestants, and antihistamines can occasionally trigger acute glaucoma. As with all medications, read the directions carefully.  Home remedies can help as well. Inhaling steam from a hot shower or kettle is the simplest. Nasal irrigations/ sinus rinses may also help by cleaning out irritating secretions. You can purchase saline nose sprays at your drugstore or you can do it yourself.  First, soak a clean washcloth in a basin containing ? teaspoon of table salt for each cup of water.  Next, hold the dripping wet cloth up to your nostrils and sniff in the saline solution.  If saline irrigations seem to help, repeat them one to three times per day.  If decongestants and nasal irrigations don't stop your cough, you may not have a postnasal drip.  But don't give up just yet: nasal sprays are worth a try. Avoid over-the-counter decongestant sprays such as those containing phenylephrine or oxymetazoline; they can help  for a day or two, but they're too irritating for long-term use. Instead, ask your doctor for about a nasal steroid spray such as beclomethasone (Beconase AQ) or triamcinolone (Nasacort AQ). Steroid sprays are safe and effective, but if they don't do the job, your doctor may prescribe a different type of spray such as ipratropium (Atrovent).  Although antibiotics are sometimes prescribed for the lingering cough due to postnasal drip, they are not helpful.  And while most cases of sinusitis respond to decongestants  and humidification, some do need antibiotics - but it should be easy to spot the postnasal drip of sinusitis, which consists of thick, gluey fluid that is often foul-tasting. In addition, many people with bacterial sinusitis have pain behind their eyes or over their forehead or cheeks, and have a fever.  Postnasal drip is the leading cause of the lingering cough. But it's far from the only cause.   2. Asthma.  Wheezing and breathlessness are the usual symptoms of asthma. But not all patients with asthma wheeze. Indeed, some just cough.  Asthma results from bronchospasm, the temporary, reversible narrowing of the medium-sized tubes that carry air into the lungs. In most cases, that air makes a whistling or wheezing sound as it moves through narrowed passages. Excessive mucus production, shortness of breath, and cough are the other classic symptoms of asthma. But in cough-variant asthma, coughing is the only symptom. It sounds exotic, but it's far from rare. In fact, asthma accounts for about one-quarter of all chronic coughs.  In most cases, cough-variant asthma produces a persistent, dry cough that occurs around the clock but may begin at night. Exposure to allergens, dust, or cold air often triggers coughing, as does exercise.  If doctors suspect that asthma is responsible for a chronic cough, they can order pulmonary function tests to confirm the diagnosis; if these tests are inconclusive, patients may be asked to inhale small doses of methacholine, a drug that often triggers wheezing in asthmatics.  Another approach to the diagnosis of cough-variant asthma is to see if the cough responds to anti-asthmatic treatment. Doctors often suggest a bronchodilator spray such as albuterol (Proventil, Ventoline). It's short acting. So, in addition your doctor might prescribe an inhaled cortico steroid, such as fluticason (Flovent), triamcinolone (Azmacort) or budesonide (Pulmicort).  If you have a chronic  cough that may be due to asthma, ask your doctor to consider testing or treating. But if asthma is not the answer, ask him to think about the third leading cause of the cough that lingers.   3. Gastroesophageal reflux disease.  Most people are surprised to learn that asthma can cause coughing without wheezing; most are shocked to learn that gastroesophageal reflux disease (GERD) can cause coughing without heartburn.  GERD occurs when stomach contents travel upstream, making their way up into the esophagus instead of down into the intestines. Heartburn is the usual symptom; belching, a sour taste in the mouth, and bad breath are common too. But acid also irritates nerves in the lower esophagus, and these nerves can trigger the cough reflex even without the distress signal of pain. In fact, about one-third of all patients with GERD are pain-free, complaining instead of cough, recurrent laryngitis, or unexplained sore throats.  GERD can be tricky to diagnose when there's no pain. Barium swallow x-rays and esophagoscopy can help, but the gold standard is esophageal pH monitoring, in which the patient swallows a probe that remains in the lower esophagus for 24 hours to detect the presence of  acid.  It's not as uncomfortable as it sounds, but it is expensive and inconvenient.  As with the other causes of chronic cough, a simpler approach to diagnosis is to try treatment.  You can begin on your own.  Avoid alcohol and foods that often trigger GERD, including those that contain chocolate, peppermint, caffeine, garlic, onions, citrus fruits, tomato sauce, or lots of fat.  Eat small meals, and never lie down until two hours after you've eaten.  Take liquid antacids, particularly at bedtime, and consider elevating the head of your bed or sleeping on a wedge-shaped pillow to keep your stomach's contents flowing down at night.  If you're constantly coughing after a week or so, you can add an over-the-counter acid  suppressor. Today there are many to choose from, including ranitidine (Zantac), cimetidine (Tagamet), famotidine (Pepcid), omeprazole (Prilosec) and lansoprazole (Prevacid). Stronger versions are available by prescription.   When coughing for any reason, you can start taking nexium 30-60 min before first meal of the day and pepcid 20 mg at bedtime.   It may take three or four weeks of gradually escalating therapy to control GERD. But if your program doesn't work, you are probably coughing for some other reason.   4. Chronic bronchitis  Is persistent inflammation of the bronchial tubes, usually resulting from tobacco abuse or long-term exposure to high levels of industrial air pollutants. Bronchiectasis is a chronic infection that also damages the walls of the bronchial tubes; it has become much less common since the advent of antibiotics. In either variant, the chronic inflammation irritates the airways and produces excess mucus, causing a chronic cough. The most effective treatment is to quit smoking and avoid air pollutants. In addition, your doctor can prescribe a corticosteroid inhaler, usually with a long-acting bronchodilator. People with chronic bronchitis are prone to flare-ups. Doctors call them COPD exacerbations. It's easy to spot because the cough gets a lot worse and the mucus becomes thicker and darker. Also COPD exacerbations causes shortness of breath and sometimes fever. The treatment includes antibiotics and an oral corticosteroid, usually prednisone.   5. Therapy with angiotensin-converting-enzyme (ACE) inhibitors.  Your doctor is also the key to this increasingly common cause of chronic cough, since the drug that he prescribed is to blame. Introduced in the 1980s, ACE inhibitors such as enalapril (Vasotec, generic), lisinopril (Prinivil, Zestril, generic), as well as many others, have assumed a prominent role in the treatment of high blood pressure. In the 1990s these medications also  became important tools in the treatment of heart failure and heart attacks.  ACE inhibitors are favored by many doctors because they produce good results and have few side effects, with one exception -- a persistent cough. It occurs in up to 20% of people taking an ACE inhibitor. The first symptom is often just a throat tickle, followed by a dry cough that can begin as soon as three weeks or as late as a year after the medication is started. Once the cough starts, it lingers and lingers.  If the cough is mild, patients may choose to continue their medication, or they may cough less if they change to a different ACE inhibitor.  But the only way to eliminate a severe cough induced by an ACE inhibitor is to switch to another type of antihypertensive medication.  Fortunately, many are available, including angiotensin-receptor blockers (ARBs) like losartan (Cozaar) and valsartan (Diovan) - drugs that act like ACE inhibitors without causing a cough.  When to worry about a cough  Although a chronic cough is usually not serious, warning symptoms call for prompt medical care. The symptoms include:  Fever, especially if it's high or prolonged  Copious sputum production  Coughing up blood  Shortness of breath  Weight loss  New Onset of unexplained weakness, fatigue, loss of appetite  Chest pain that's not caused by the cough itself  Night sweats  Wheezing   Less common causes In nonsmokers, the Big Five account for more than nine of every 10 chronic coughs. But other problems can - and do - cause lingering coughs.  - Lung infections make people cough. But most cases of pneumonia are acute infections requiring rapid diagnosis and treatment. Lung infections caused by mycoplasma, chlamydia, and tuberculosis, however, can be more indolent and can cause a persistent cough. Fever is the important clue to infectious causes of persistent coughing; patients with bacterial infections also raise thick,  dark-colored sputum that is sometimes tinged with blood.  - Pertussis (whooping cough) is a respiratory tract infection that can cause serious problems in children who have not been immunized properly with diphtheria-pertussis-tetanus (DPT) vaccine. Pertussis began to resurface in adolescents and adults because the original tetanus-diphtheria booster shots did not cover pertussis and the vaccine's effectiveness wears off over time.  - Heart disease can masquerade as lung disease if coughing and breathlessness are its main symptoms. It's a common occurrence in patients with heart failure (HF). Patients with HF usually have a history of heart disease. Their cough is most pronounced when they're lying flat, so they often resort to sleeping propped up on three or four pillows. The cough of HF may be dry, or it may produce thin, frothy white sputum. Leg swelling, fatigue, and exercise intolerance are other common symptoms of HF.  - Abnormal swallowing can lead to persistent coughing if food triggers the cough reflex by heading down the "windpipe" instead of the "food pipe." Called aspiration, the problem occurs mainly in people with strokes or other neurologic disorders that hamper normal swallowing.  - Environmental irritants can trigger the cough reflex, not just once but with nearly every breath of air laden with chemicals or particles ranging from sulfur dioxide to nitric oxide to dust and molds. Even clean air can trigger coughing if it is too dry or too cold.  - Lung cancer certainly belongs on the list of disorders that cause persistent coughing. Fortunately, though, it's not high on the list, at least in nonsmokers. Still, even in nonsmokers, bloody sputum (hemoptysis) or chest pain should raise concern about a lung tumor.  - Stress. Mental factors can produce many physical symptoms, including cough. Psychogenic coughing increases at times of stress and disappears during sleep. The cough itself is  innocent, but it can sometimes signal serious emotional problems.   Cough medicine If you don't think that coughing is a common complaint, just head to the nearest drugstore. You'll find a bewildering array of syrups, sprays, tablets, and lozenges designed to control coughing. You'll also see a steady stream of customers coughing up lots of money to purchase products that may be ineffective. In all, Americans spend about $3.5 billion a year on over-the-counter and prescription cough remedies.  Many cough remedies contain expectorants, compounds intended to loosen sputum, making it easier to clear. Guaifenesin is the most popular expectorant. Unfortunately, there is little scientific evidence that expectorants are effective. You'll probably do just as well by using a humidifier and drinking lots of water.  Cough suppressants are also very popular. Nonprescription  agents such as dextromethorphan can partially suppress the cough reflex, promoting patient comfort. Prescription cough syrups with codeine tend to be more effective. When used appropriately, cough suppressants can reduce discomfort; remember, though, that because coughing can serve a useful function, it should not always be suppressed.  Many over-the-counter cough remedies contain additional ingredients. Antihistamines can be helpful if an allergy or postnasal drip is responsible for the cough, but they are often used for other coughs. Antihistamines dry out secretions, making them harder to expel and worsening sinusitis. They often cause sleepiness, an advantage only at night. Decongestants are present in many cough remedies, but they make sense only for patients who are coughing because of postnasal drip or sinusitis. Topical anesthetics such as benzocaine are sometimes part of the mix; although they are intended to quiet the nerves that trigger the cough reflex, they are of dubious value. Alcohol is present in some cough syrups but has no direct  benefit.  Medicated lozenges and cough drops are among the most widely sold cough remedies. These products contain various combinations of menthol, camphor, eucalyptus oil, honey, and other ingredients. Like with liquid cough medicines, some also contain topical anesthetics. Despite their popularity, there is no evidence that medicated cough drops are more effective than simple hard candies.  Finding causes and cures Don't ignore a chronic cough - but don't panic just because your cough lingers for more than three or four weeks.  Most often, the puzzle can be solved without elaborate tests, and the problem can be corrected with simple treatments.  In fact, you may be able to diagnose and treat yourself, especially if postnasal drip or gastroesophageal reflux is the culprit.  Even so, you should turn to your doctor for help and guidance.  In most cases, it won't take much more than a stethoscope and a treatment trial or two.    But if your cough is accompanied by sputum production, bloody sputum, fever, weight loss, night sweats, breathlessness, undue fatigue, or chest pain, you should consult your doctor without delay.  You can expect tests ranging from sputum exams and chest x-rays to pulmonary function tests, CT scans, and bronchoscopies.  In most cases, you'll get good news - and you can expect to get a treatment program that will quiet your nagging cough.

## 2017-12-20 NOTE — Progress Notes (Signed)
Acute Care Office visit  Assessment and plan:  1. Persistent dry cough   2. Post-nasal drainage   3. Gastroesophageal reflux disease, esophagitis presence not specified   4. Chronic sinusitis, unspecified location   5. Medication monitoring encounter     Sinusitis -Instructed pt to being AYR sinus rinses each morning and night -Instructed pt to do flonase sprays immediately following rinse -Instructed pt to take an allergy medication each day as well such as allegra or zyrtec -Explained that pt's ears are still full and she believes that post-nasal drip is due to previous sinusitis -explained that sinusitis can also be impacting her cough  -Educated pt that she must take allergy medications every day in order to see positive outcomes  GERD -Began Zantac in addition to omeprazole -Educated pt that reflux causes acid to go into the esophagus and mouth, which can cause her tongue to taste and feel differently  -educated pt that reflux can cause a dry, non-productive cough first thing in the morning that goes away as time goes on -Explained that she believes the tongue and cough are due to acid reflux  -Discussed foods to avoid or decrease in order to decrease reflux including sodas, citrus juices, and eating late at night  Health Management -Instructed pt to take her medication list home and identify what medications she is and is not taking -Instructed pt to contact us when she finishes going through her medication list and crossing off what she is taking -Encouraged pt to make reminders about her medication in order to help her remember what she is and is not taking  - Viral vs Allergic vs Bacterial causes for pt's symptoms reveiwed.    - Supportive care and various OTC medications discussed in addition to any prescribed. - Call or RTC if new symptoms, or if no improvement or worse over next several days.   - Will consider ABX if sx continue past 10 days and worsening if not  already given.    Meds ordered this encounter  Medications  . sertraline (ZOLOFT) 50 MG tablet    Sig: 1 tablet daily    Dispense:  90 tablet    Refill:  1  . fluticasone (FLONASE) 50 MCG/ACT nasal spray    Sig: 1 spray 2 times daily after sinus rinses- Milta Deiters med or AYR    Dispense:  16 g    Refill:  6  . ranitidine (ZANTAC) 300 MG tablet    Sig: Take 0.5 tablets (150 mg total) by mouth 2 (two) times daily.    Dispense:  180 tablet    Refill:  3    Medications Discontinued During This Encounter  Medication Reason  . aspirin 81 MG tablet Patient Preference  . diphenhydrAMINE-APAP, sleep, (TYLENOL PM EXTRA STRENGTH PO) Discontinued by provider  . escitalopram (LEXAPRO) 20 MG tablet Discontinued by provider  . memantine (NAMENDA) 10 MG tablet   . omeprazole (PRILOSEC OTC) 20 MG tablet   . predniSONE (DELTASONE) 10 MG tablet Completed Course  . sertraline (ZOLOFT) 50 MG tablet      No orders of the defined types were placed in this encounter.   Gross side effects, risk and benefits, and alternatives of medications discussed with patient.  Patient is aware that all medications have potential side effects and we are unable to predict every sideeffect or drug-drug interaction that may occur.  Expresses verbal understanding and consents to current therapy plan and treatment regiment.   Education and routine  counseling performed. Handouts provided.  Anticipatory guidance and routine counseling done re: condition, txmnt options and need for follow up. All questions of patient's were answered.  Return for Please follow-up for chronic care as previously discussed, and as needed.  Please see AVS handed out to patient at the end of our visit for additional patient instructions/ counseling done pertaining to today's office visit.  Note:  This document was partially repared using Dragon voice recognition software and may include unintentional dictation errors. This document serves as a  record of services personally performed by Mellody Dance, MD. It was created on her behalf by Georga Bora, a trained medical scribe. The creation of this record is based on the scribe's personal observations and the provider's statements to them.   I have reviewed the above medical documentation for accuracy and completeness and I concur.  Mellody Dance, D.O.     Subjective:    Chief Complaint  Patient presents with  . Follow-up    cough    HPI:  Pt presents with Sx for over 30 days CMA has discussed some medications with pt husband, who states she doesn't remember her medications very well   C/o:  -Pt states she is still having the dry cough, non-productive -Says she coughs the most in the morning, but not very much throughout the day -Is feeling some pressure in her sinuses but her nose isn't running  -Says the nasal drip just started today -Says she now "feels like her nose is dripping and she's getting back sick"  -Says her tongue is bothering her  -States it feels scratchy and raw  Denies: SOB, wheezing, pain with breathing, productive cough    For symptoms patient has tried:   -Pt is taking robutussin and cough suppressant OTC  Medications -Pt thinks she is taking omeprazole but isn't sure -Can't remember what her [place] is for or if she is taking it -Believes she is taking Airicept -Didn't remember why CMA cut off specific medications from her list    Patient Care Team    Relationship Specialty Notifications Start End  Mellody Dance, DO PCP - General Family Medicine  08/24/15   Barrett, Evelene Croon, PA-C Physician Assistant Cardiology  01/08/16    Comment: 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  Megan Salon, MD Consulting Physician Gynecology  11/14/16     Past medical history, Surgical history, Family history reviewed and noted below, Social history, Allergies, and Medications have been entered into the medical record, reviewed and changed as  needed.   Allergies  Allergen Reactions  . Ciprofloxacin Other (See Comments)    Unknown (vertigo??)  . Citalopram Hydrobromide Other (See Comments)    Intrusive/ odd thoughts   . Flagyl [Metronidazole] Other (See Comments)    Unknown??  . Oxycodone Other (See Comments)    Headaches    Review of Systems: - see above HPI for pertinent positives General:   No F/C, wt loss Pulm:   No DIB, pleuritic chest pain Card:  No CP, palpitations Abd:  No n/v/d or pain Ext:  No inc edema from baseline   Objective:   Blood pressure 103/70, pulse 87, height 5\' 3"  (1.6 m), weight 172 lb 4.8 oz (78.2 kg), SpO2 98 %. Body mass index is 30.52 kg/m. General: Well Developed, well nourished, appropriate for stated age.  Neuro: Alert and oriented x3, extra-ocular muscles intact, sensation grossly intact.  HEENT: Normocephalic, atraumatic, pupils equal round reactive to light, neck supple, no masses, no  painful lymphadenopathy, TM's intact B/L. Nares- patent, clear d/c, OP- clear, mild erythema, No TTP sinuses  Bilaterally TMs are cloudy and slightly bulged Skin: Warm and dry, no gross rash. Cardiac: RRR, S1 S2,  no murmurs rubs or gallops.  Respiratory: ECTA B/L and A/P, Not using accessory muscles, speaking in full sentences- unlabored. Vascular:  No gross lower ext edema, cap RF less 2 sec. Psych: No HI/SI, judgement and insight good, Euthymic mood. Full Affect.

## 2017-12-24 ENCOUNTER — Telehealth: Payer: Self-pay | Admitting: Family Medicine

## 2017-12-24 NOTE — Telephone Encounter (Signed)
Patient called states she had sick OV last week & provider suggested a mouth rinse for irritated mouth- per patient symptoms have worsen and she now needs.  --forwarding request to medical assistant to review w/provider & send Rx to :  [facility] [address] [place] [phone] (Phone) [phone] (Fax)   --glh

## 2017-12-26 ENCOUNTER — Other Ambulatory Visit: Payer: Self-pay

## 2017-12-26 MED ORDER — MAGIC MOUTHWASH W/LIDOCAINE: 5.0000 mL | Freq: Three times a day (TID) | ORAL | 0 refills | Status: DC | PRN

## 2017-12-26 NOTE — Telephone Encounter (Signed)
Magic Mouthwash sent to [facility].  Patient notified. MPulliam, CMA/RT(R)

## 2017-12-27 ENCOUNTER — Other Ambulatory Visit: Payer: Self-pay

## 2017-12-27 MED ORDER — MAGIC MOUTHWASH W/LIDOCAINE: 15.0000 mL | Freq: Four times a day (QID) | ORAL | 0 refills | Status: DC | PRN

## 2017-12-30 ENCOUNTER — Telehealth: Payer: Self-pay | Admitting: Family Medicine

## 2017-12-30 NOTE — Telephone Encounter (Signed)
Patient scheduled.

## 2017-12-30 NOTE — Telephone Encounter (Signed)
°  Forwarding message to medical assistant to call pt to listen to her symptoms & advised her on what to do for comfort.  Patient called requested OV for continued symptoms-- Advised no available appts for Dr. Jenetta Downer & offered Valetta Fuller), but see pt had Acute care OV on 10/18 with provider/ Dr. Jenetta Downer for cold symptoms & irritated mouth (was offered mouth wash at that visit but declined 2 days later call back for Rx mouth wash) --Patient using mouthwash since Friday , now says isn't feeling well -- Per office decision pt needs to says w/ provider who treated her on Acute OV.  --Spoke w/ medical assistant to talk w/patient about ongoing health concern but was advised to just bring Pt in for OV -- No OV  for 10/28 o to use 10/29. ---Miscommunication regarding 10/28 @ 11:30 appt availability.  Forwarding message to medical assistant.  --Dion Body

## 2017-12-31 ENCOUNTER — Other Ambulatory Visit: Payer: Self-pay

## 2017-12-31 ENCOUNTER — Ambulatory Visit (INDEPENDENT_AMBULATORY_CARE_PROVIDER_SITE_OTHER): Payer: Medicare HMO | Admitting: Family Medicine

## 2017-12-31 ENCOUNTER — Encounter: Payer: Self-pay | Admitting: Family Medicine

## 2017-12-31 VITALS — BP 131/75 | HR 60 | Temp 97.8°F | Resp 18 | Wt 174.0 lb

## 2017-12-31 DIAGNOSIS — R0902 Hypoxemia: Secondary | ICD-10-CM | POA: Diagnosis not present

## 2017-12-31 DIAGNOSIS — R0602 Shortness of breath: Secondary | ICD-10-CM

## 2017-12-31 DIAGNOSIS — R05 Cough: Secondary | ICD-10-CM | POA: Diagnosis not present

## 2017-12-31 DIAGNOSIS — R6889 Other general symptoms and signs: Secondary | ICD-10-CM

## 2017-12-31 DIAGNOSIS — R531 Weakness: Secondary | ICD-10-CM

## 2017-12-31 DIAGNOSIS — J849 Interstitial pulmonary disease, unspecified: Secondary | ICD-10-CM

## 2017-12-31 DIAGNOSIS — R059 Cough, unspecified: Secondary | ICD-10-CM

## 2017-12-31 LAB — POCT URINALYSIS DIPSTICK
BILIRUBIN UA: NEGATIVE
GLUCOSE UA: NEGATIVE
Ketones, UA: NEGATIVE
LEUKOCYTES UA: NEGATIVE
Nitrite, UA: NEGATIVE
Protein, UA: POSITIVE — AB
RBC UA: NEGATIVE
Spec Grav, UA: 1.015 (ref 1.010–1.025)
Urobilinogen, UA: 0.2 E.U./dL
pH, UA: 8.5 — AB (ref 5.0–8.0)

## 2017-12-31 LAB — POCT INFLUENZA A/B
INFLUENZA B, POC: NEGATIVE
Influenza A, POC: NEGATIVE

## 2017-12-31 NOTE — Patient Instructions (Addendum)
CT scan ordered   -Also honey we ordered a bunch of labs on you today.  Your flu test was negative, we will await the results of all your other tests which will probably take a few days.  We will let you know what the results are as they come in.  Please let us know if you have any change in your symptoms.    Weakness Weakness is a lack of strength. You may feel weak all over your body (generalized), or you may feel weak in one specific part of your body (focal). Common causes of weakness include:  Infection and immune system disorders.  Physical exhaustion.  Internal bleeding or other blood loss that results in a lack of red blood cells (anemia).  Dehydration.  An imbalance in mineral (electrolyte) levels, such as potassium.  Heart disease, circulation problems, or stroke.  Other causes include:  Some medicines or cancer treatment.  Stress, anxiety, or depression.  Nervous system disorders.  Thyroid disorders.  Loss of muscle strength because of age or inactivity.  Poor sleep quality or sleep disorders.  The cause of your weakness may not be known. Some causes of weakness can be serious, so it is important to see your health care provider. Follow these instructions at home:  Rest as needed.  Try to get enough sleep. Talk to your health care provider about how much sleep you need each night.  Take over-the-counter and prescription medicines only as told by your health care provider.  Eat a healthy, well-balanced diet. This includes: ? Proteins to build muscles, such as lean meats and fish. ? Fresh fruits and vegetables. ? Carbohydrates to boost energy, such as whole grains.  Drink enough fluid to keep your urine clear or pale yellow.  Do strength exercises, such as arm curls and leg raises, for 30 minutes at least 2 days a week or as told by your health care provider.  Consider working with a physical therapist or trainer who can develop an exercise plan to help  you gain muscle strength.  Keep all follow-up visits as told by your health care provider. This is important. Contact a health care provider if:  Your weakness does not improve or gets worse.  Your weakness affects your ability to think clearly.  Your weakness affects your ability to do your normal daily activities. Get help right away if:  You develop sudden weakness.  You have trouble breathing or shortness of breath.  You have problems with your vision.  You have trouble talking or swallowing.  You have trouble standing or walking.  You have chest pain.  You are light-headed or lose consciousness. This information is not intended to replace advice given to you by your health care provider. Make sure you discuss any questions you have with your health care provider. Document Released: 02/19/2005 Document Revised: 03/17/2015 Document Reviewed: 12/10/2014 Elsevier Interactive Patient Education  [employer].

## 2017-12-31 NOTE — Progress Notes (Signed)
Pt here for an acute care OV today   Impression and Recommendations:    1. Weakness   2. Ill feeling   3. Cough -  2-3 mo    4. Hypoxia   5. Interstitial pulmonary disease ([place])   6. Shortness of breath     1. Weakness - Pulse Ox of 92-94% - Discussed need to look for reason for low Pulse Ox - we will order blood work as well as CT chest. - urinalysis drawn. - Will check for BV/yeast with self-swab. -Extensive additional labs as ordered below.  - Encouraged patient to drink adequate amounts of water.  - Otherwise, encouraged patient to call clinic and come in sooner if anything changes.  - Last DG Chest View - 11/21/2017  CLINICAL DATA:  Cough and shortness of breath.  EXAM: CHEST - 2 VIEW  COMPARISON:  10/26/2015  FINDINGS: The heart size and mediastinal contours are within normal limits. Aortic atherosclerosis. Both lungs are clear. No effusions. The visualized skeletal structures are unremarkable.  IMPRESSION: No active cardiopulmonary disease.  Aortic Atherosclerosis (ICD10-I70.0).  Electronically Signed   By: Lorriane Shire M.D.   On: 11/20/2017 11:21  2. Follow-Up - Prescriptions refilled today. - Re-check fasting lab work as recommended. - Recommended that patient follow up with dentist as recommended-could be an overt infection from oral abscess etc. - Otherwise, continue to return for CPE and chronic follow-up as scheduled.   - Patient knows to call in sooner if desired to address acute concerns.    Orders Placed This Encounter  Procedures  . WET PREP FOR [place], YEAST, CLUE  . Urine Culture  . CT Chest Wo Contrast  . CBC with Differential/Platelet  . Comp Met (CMET)  . Epstein-Barr virus VCA antibody panel  . T4, free  . TSH  . Hepatitis C Antibody  . CBC with Differential/Platelet  . TSH  . POCT Influenza A/B  . PPD  . POCT Urinalysis Dipstick     Education and routine counseling performed. Handouts provided  Gross side  effects, risk and benefits, and alternatives of medications and treatment plan in general discussed with patient.  Patient is aware that all medications have potential side effects and we are unable to predict every side effect or drug-drug interaction that may occur.   Patient will call with any questions prior to using medication if they have concerns.    Expresses verbal understanding and consents to current therapy and treatment regimen.  No barriers to understanding were identified.  Red flag symptoms and signs discussed in detail.  Patient expressed understanding regarding what to do in case of emergency\urgent symptoms   Please see AVS handed out to patient at the end of our visit for further patient instructions/ counseling done pertaining to today's office visit.   Return if symptoms worsen or fail to improve, for Follow-up chronic care as previously discussed as well.     Note:  This document was prepared occasionally using Dragon voice recognition software and may include unintentional dictation errors in addition to a scribe.  This document serves as a record of services personally performed by Mellody Dance, DO. It was created on her behalf by Toni Amend, a trained medical scribe. The creation of this record is based on the scribe's personal observations and the provider's statements to them.   I have reviewed the above medical documentation for accuracy and completeness and I concur.  Mellody Dance 01/06/18 6:29 AM    -----------------------------------------------------------------------------------------------------------------------------  Subjective:    CC:  Chief Complaint  Patient presents with  . Fatigue    HPI: Frances Hoffman is a 77 y.o. female who presents to [place] at [facility] today for issues as discussed below.  Oxygen gets as high as 94%, but on intake 91%.  92%-94% Pulse Ox on room air.  Onset: 2-3 months ago; has  been feeling unwell for a long time.  States "I just don't feel good.  I don't have any energy and I don't feel good."  Has had a chronic cough.  States "the cough is better, though this morning it seems to be back some."  Confirms that she has had post-nasal drip, allergies.  Symptoms:  No energy, tired.  Cough, fatigue.  Overall feeling bad.  "I just sit around and sleep or whatever," "I don't do anything."  Hasn't been feeling well for about 2-3 months.  Continues experiencing symptoms in her mouth, for which she is using Magic Mouthwash.  Notes food doesn't taste very good to her, and that she hasn't been drinking as much water as she should.  Denies: Denies dizziness, lightheadedness, urinary problems, denies burning or frequency or blood while urination.  States she thinks she may have a yeast infection after taking a recent antibiotic.  Denies shortness of breath.  Denies chest pain.  Denies wheezing or difficulty breathing.  Notes she is going to the bathroom okay, with both stools and urine.  Had diarrhea yesterday, "big time," but this is not new.  Patient notes that she is due for next colonoscopy at age 8.   No problems updated.   Wt Readings from Last 3 Encounters:  12/31/17 174 lb (78.9 kg)  12/20/17 172 lb 4.8 oz (78.2 kg)  11/28/17 171 lb 6.4 oz (77.7 kg)   BP Readings from Last 3 Encounters:  12/31/17 131/75  12/20/17 103/70  11/28/17 107/72   BMI Readings from Last 3 Encounters:  12/31/17 30.82 kg/m  12/20/17 30.52 kg/m  11/28/17 30.36 kg/m     Patient Care Team    Relationship Specialty Notifications Start End  Mellody Dance, DO PCP - General Family Medicine  08/24/15   Barrett, Evelene Croon, PA-C Physician Assistant Cardiology  01/08/16    Comment: 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  Megan Salon, [place] Physician Gynecology  11/14/16      Patient Active Problem List   Diagnosis Date Noted  . Breast cancer ([place])- L breast 2000  11/14/2016    Priority: High  . Hyperlipidemia 12/12/2007    Priority: High  . Adjustment disorder with mixed anxiety and depressed mood 12/12/2007    Priority: High  . Depression 12/23/2015    Priority: Medium  . Overweight 03/15/2015    Priority: Medium  . Insomnia- due to stress 01/16/2011    Priority: Medium  . Hypothyroidism 12/12/2007    Priority: Medium  . Chronic GERD 12/12/2007    Priority: Medium  . Leg cramping 02/10/2016    Priority: Low  . Asx Hypotension 09/25/2015    Priority: Low  . Fatigue 08/15/2009    Priority: Low  . Osteopenia 11/26/2008    Priority: Low  . Weakness 12/31/2017  . Hypoxia 12/31/2017  . Ill feeling 12/31/2017  . Persistent dry cough 12/20/2017  . Post-nasal drainage 12/20/2017  . Medication monitoring encounter 12/20/2017  . Chronic sinusitis 12/20/2017  . Chronic kidney disease (CKD) stage G3a/A1, moderately decreased glomerular filtration rate (GFR) between 45-59 mL/min/1.73 square  meter and albuminuria creatinine ratio less than 30 mg/g (HCC) 11/28/2017  . Mild dehydration 11/28/2017  . Cough -  2-3 mo  11/28/2017  . Environmental and seasonal allergies 11/28/2017  . Hearing loss and HA due to cerumen impaction, bilateral 10/10/2017  . Nonintractable headache 10/10/2017  . Vascular dementia without behavioral disturbance ([place]) 09/27/2017  . Memory difficulties 09/27/2017  . Prediabetes 09/27/2017  . Mild cognitive impairment 07/02/2017  . Cerebral vascular disease 07/02/2017  . Chronic insomnia 07/02/2017  . Primary osteoarthritis of first carpometacarpal joint of left hand 01/09/2017  . Obesity, Class I, BMI 30-34.9 08/19/2016  . 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 Chest pain 10/26/2015  . Fecal incontinence 07/29/2015  . Heme positive stool 07/29/2015  . Leg pain 06/06/2015  . Routine general medical examination at a health care facility 04/24/2015  . Memory loss 09/22/2014  . Exercise intolerance 07/30/2014  .  Estrogen deficiency 04/28/2014  . Tachycardia 04/03/2014  . Family history of heart disease 08/27/2013  . Dyspnea on exertion 08/18/2013  . Positional vertigo 08/18/2013  . Encounter for Medicare annual wellness exam 11/23/2011  . Other screening mammogram 11/23/2011  . Left sided abdominal sub-Q tissue pain of unknown cause/ ( dx as L sided pelvic pain in past) 12/13/2009  . WEIGHT GAIN 01/15/2008  . ADHESIVE CAPSULITIS 12/12/2007      Past Medical History:  Diagnosis Date  . Adjustment disorder with mixed anxiety and depressed mood 12/12/2007   Qualifier: Diagnosis of  By: Copland MD, Frederico Hamman    . Cancer ([place]) 2000   breast had lumpectomy/radiation x36,mammo ,no chemo  . Cataract of both eyes   . Depression   . Dysuria-frequency syndrome    takes AZO  . Frozen shoulder   . Gall stones   . GERD 12/12/2007   Qualifier: Diagnosis of  By: Copland MD, Frederico Hamman    . Hyperlipidemia   . Hypothyroidism 12/12/2007   Qualifier: Diagnosis of  By: Copland MD, Frederico Hamman    . Memory loss   . Osteopenia    BMD 2004, WNL 2008  . Personal history of radiation therapy   . Pneumonia   . Recurrent UTI   . Shingles    chronic body pain, left side of body  . Shortness of breath dyspnea    when climbing stairs only  . Wears glasses      Past Surgical History:  Procedure Laterality Date  . BREAST LUMPECTOMY Left 2000  . CHOLECYSTECTOMY N/A 02/21/2015   Procedure: LAPAROSCOPIC CHOLECYSTECTOMY WITH INTRAOPERATIVE CHOLANGIOGRAM;  Surgeon: Greer Pickerel, MD;  Location: [facility];  Service: General;  Laterality: N/A;  . COLONOSCOPY    . MULTIPLE TOOTH EXTRACTIONS       Family History  Problem Relation Age of Onset  . Osteoporosis Mother   . Parkinsonism Father   . Cancer Sister        breast CA  . Breast cancer Sister 37  . Cancer Other        breast CA  . Coronary artery disease Sister   . Breast cancer Sister   . Heart disease Maternal Grandmother   . Heart disease Maternal Grandfather       Social History   Socioeconomic History  . Marital status: Married    Spouse name: Not on file  . Number of children: 2  . Years of education: 81  . Highest education level: High school graduate  Occupational History  . Not on file  Social Needs  . Financial  resource strain: Not on file  . Food insecurity:    Worry: Not on file    Inability: Not on file  . Transportation needs:    Medical: Not on file    Non-medical: Not on file  Tobacco Use  . Smoking status: Never Smoker  . Smokeless tobacco: Never Used  Substance and Sexual Activity  . Alcohol use: No    Alcohol/week: 0.0 standard drinks  . Drug use: No  . Sexual activity: Never    Birth control/protection: Post-menopausal  Lifestyle  . Physical activity:    Days per week: Not on file    Minutes per session: Not on file  . Stress: Not on file  Relationships  . Social connections:    Talks on phone: Not on file    Gets together: Not on file    Attends religious service: Not on file    Active member of club or organization: Not on file    Attends meetings of clubs or organizations: Not on file    Relationship status: Not on file  . Intimate partner violence:    Fear of current or ex partner: Not on file    Emotionally abused: Not on file    Physically abused: Not on file    Forced sexual activity: Not on file  Other Topics Concern  . Not on file  Social History Narrative   Lives at home with her husband.   Right-handed.   [employer] most days.     Current Meds  Medication Sig  . atorvastatin (LIPITOR) 20 MG tablet Take 1 tablet (20 mg total) by mouth at bedtime.  [name] b complex vitamins tablet Take 1 tablet by mouth daily.  . calcium carbonate (OS-CAL - DOSED IN MG OF ELEMENTAL CALCIUM) 1250 (500 CA) MG tablet Take 1 tablet by mouth daily.  . carboxymethylcellulose (REFRESH PLUS) 0.5 % SOLN Place 1 drop into both eyes 3 (three) times daily as needed (for dryness).  . Cholecalciferol (VITAMIN D3) 2000 UNITS  capsule Take 2,000 Units by mouth daily.    . Cranberry 125 MG TABS Take 2 tablets by mouth.  . donepezil (ARICEPT) 10 MG tablet Take 1 tablet (10 mg total) by mouth at bedtime.  . fluticasone (FLONASE) 50 MCG/ACT nasal spray 1 spray 2 times daily after sinus rinses- Milta Deiters med or AYR  . levothyroxine (SYNTHROID, LEVOTHROID) 75 MCG tablet Take 1 tablet (75 mcg total) by mouth daily.  [name] liothyronine (CYTOMEL) 5 MCG tablet Take 1 tablet (5 mcg total) by mouth daily.  . magic mouthwash w/lidocaine SOLN Take 15 mLs by mouth 4 (four) times daily as needed for mouth pain. Swish and spit  . Multiple Vitamin (MULTIVITAMIN) tablet Take 1 tablet by mouth daily.    . Omega-3 Fatty Acids (FISH OIL PO) Take 2 capsules by mouth 2 (two) times daily.  . ranitidine (ZANTAC) 300 MG tablet Take 0.5 tablets (150 mg total) by mouth 2 (two) times daily.  . sertraline (ZOLOFT) 50 MG tablet 1 tablet daily  . vitamin C (ASCORBIC ACID) 500 MG tablet Take 500 mg by mouth daily.    Allergies:  Allergies  Allergen Reactions  . Ciprofloxacin Other (See Comments)    Unknown (vertigo??)  . Citalopram Hydrobromide Other (See Comments)    Intrusive/ odd thoughts   . Flagyl [Metronidazole] Other (See Comments)    Unknown??  . Oxycodone Other (See Comments)    Headaches     Review of Systems: General:   Denies  fever, chills, unexplained weight loss.  Optho/Auditory:   Denies visual changes, blurred vision/LOV Respiratory:   Denies wheeze, DOE more than baseline levels.   Cardiovascular:   Denies chest pain, palpitations, new onset peripheral edema  Gastrointestinal:   Denies nausea, vomiting, diarrhea, abd pain.  Genitourinary: Denies dysuria, freq/ urgency, flank pain or discharge from genitals.  Endocrine:     Denies hot or cold intolerance, polyuria, polydipsia. Musculoskeletal:   Denies unexplained myalgias, joint swelling, unexplained arthralgias, gait problems.  Skin:  Denies new onset rash, suspicious  lesions Neurological:     Denies dizziness, unexplained weakness, numbness  Psychiatric/Behavioral:   Denies mood changes, suicidal or homicidal ideations, hallucinations    Objective:   Blood pressure 131/75, pulse 60, temperature 97.8 F (36.6 C), temperature source Oral, resp. rate 18, weight 174 lb (78.9 kg), SpO2 94 %. Body mass index is 30.82 kg/m. General:  Well Developed, well nourished, appropriate for stated age.  Neuro:  Alert and oriented,  extra-ocular muscles intact  HEENT:  Normocephalic, atraumatic, neck supple Skin:  no gross rash, warm, pink. Cardiac:  RRR, S1 S2 Respiratory:  ECTA B/L and A/P, Not using accessory muscles, speaking in full sentences- unlabored. Vascular:  Ext warm, no cyanosis apprec.; cap RF less 2 sec. Psych:  No HI/SI, judgement and insight good, Euthymic mood. Full Affect.

## 2018-01-01 ENCOUNTER — Ambulatory Visit: Payer: Medicare HMO | Admitting: Nurse Practitioner

## 2018-01-01 ENCOUNTER — Ambulatory Visit: Payer: Medicare HMO | Admitting: Family Medicine

## 2018-01-02 ENCOUNTER — Ambulatory Visit (INDEPENDENT_AMBULATORY_CARE_PROVIDER_SITE_OTHER): Payer: Medicare HMO

## 2018-01-02 DIAGNOSIS — Z111 Encounter for screening for respiratory tuberculosis: Secondary | ICD-10-CM | POA: Diagnosis not present

## 2018-01-02 LAB — EPSTEIN-BARR VIRUS VCA ANTIBODY PANEL
EBV VCA IgG: 129 U/mL — ABNORMAL HIGH (ref 0.0–17.9)
EBV VCA IgM: <36 U/mL (ref 0.0–35.9)

## 2018-01-02 LAB — COMPREHENSIVE METABOLIC PANEL
A/G RATIO: 1.5 (ref 1.2–2.2)
ALT: 25 IU/L (ref 0–32)
AST: 32 IU/L (ref 0–40)
Albumin: 4.2 g/dL (ref 3.5–4.8)
Alkaline Phosphatase: 115 IU/L (ref 39–117)
BUN/Creatinine Ratio: 14 (ref 12–28)
BUN: 13 mg/dL (ref 8–27)
Bilirubin Total: 0.5 mg/dL (ref 0.0–1.2)
CALCIUM: 9.6 mg/dL (ref 8.7–10.3)
CO2: 28 mmol/L (ref 20–29)
CREATININE: 0.95 mg/dL (ref 0.57–1.00)
Chloride: 101 mmol/L (ref 96–106)
GFR, EST AFRICAN AMERICAN: 67 mL/min/{1.73_m2} (ref >59)
GFR, EST NON AFRICAN AMERICAN: 58 mL/min/{1.73_m2} — AB (ref >59)
Globulin, Total: 2.8 g/dL (ref 1.5–4.5)
Glucose: 101 mg/dL — ABNORMAL HIGH (ref 65–99)
POTASSIUM: 4.1 mmol/L (ref 3.5–5.2)
Sodium: 144 mmol/L (ref 134–144)
TOTAL PROTEIN: 7 g/dL (ref 6.0–8.5)

## 2018-01-02 LAB — URINE CULTURE

## 2018-01-02 LAB — CBC WITH DIFFERENTIAL/PLATELET
BASOS: 0 %
Basophils Absolute: 0 10*3/uL (ref 0.0–0.2)
EOS (ABSOLUTE): 0.2 10*3/uL (ref 0.0–0.4)
EOS: 2 %
HEMATOCRIT: 41.5 % (ref 34.0–46.6)
Hemoglobin: 14 g/dL (ref 11.1–15.9)
IMMATURE GRANULOCYTES: 0 %
Immature Grans (Abs): 0 10*3/uL (ref 0.0–0.1)
LYMPHS: 15 %
Lymphocytes Absolute: 1.7 10*3/uL (ref 0.7–3.1)
MCH: 30.9 pg (ref 26.6–33.0)
MCHC: 33.7 g/dL (ref 31.5–35.7)
MCV: 92 fL (ref 79–97)
MONOCYTES: 10 %
Monocytes Absolute: 1.1 10*3/uL — ABNORMAL HIGH (ref 0.1–0.9)
NEUTROS PCT: 73 %
Neutrophils Absolute: 8.4 10*3/uL — ABNORMAL HIGH (ref 1.4–7.0)
Platelets: 296 10*3/uL (ref 150–450)
RBC: 4.53 x10E6/uL (ref 3.77–5.28)
RDW: 13.5 % (ref 12.3–15.4)
WBC: 11.5 10*3/uL — ABNORMAL HIGH (ref 3.4–10.8)

## 2018-01-02 LAB — TSH: TSH: 3.85 u[IU]/mL (ref 0.450–4.500)

## 2018-01-02 LAB — HEPATITIS C ANTIBODY: Hep C Virus Ab: <0.1 s/co ratio (ref 0.0–0.9)

## 2018-01-02 LAB — T4, FREE: FREE T4: 1 ng/dL (ref 0.82–1.77)

## 2018-01-02 NOTE — Progress Notes (Signed)
HPI:  Patient is here for PPD read.  A&P:  Results were negative, 0 mm induration.  [name], CMA

## 2018-01-03 ENCOUNTER — Other Ambulatory Visit: Payer: Self-pay | Admitting: Family Medicine

## 2018-01-06 ENCOUNTER — Other Ambulatory Visit: Payer: Medicare HMO

## 2018-01-06 ENCOUNTER — Ambulatory Visit
Admission: RE | Admit: 2018-01-06 | Discharge: 2018-01-06 | Disposition: A | Discharge status: Home / Self Care | Payer: Medicare HMO | Source: Ambulatory Visit | Attending: Family Medicine | Admitting: Family Medicine

## 2018-01-06 DIAGNOSIS — R059 Cough, unspecified: Secondary | ICD-10-CM

## 2018-01-06 DIAGNOSIS — R531 Weakness: Secondary | ICD-10-CM

## 2018-01-06 DIAGNOSIS — R05 Cough: Secondary | ICD-10-CM

## 2018-01-06 DIAGNOSIS — R6889 Other general symptoms and signs: Secondary | ICD-10-CM

## 2018-01-06 DIAGNOSIS — R0602 Shortness of breath: Secondary | ICD-10-CM

## 2018-01-06 DIAGNOSIS — R0902 Hypoxemia: Secondary | ICD-10-CM

## 2018-01-06 DIAGNOSIS — J849 Interstitial pulmonary disease, unspecified: Secondary | ICD-10-CM

## 2018-01-08 LAB — WET PREP FOR TRICH, YEAST, CLUE

## 2018-01-09 ENCOUNTER — Telehealth: Payer: Self-pay | Admitting: Family Medicine

## 2018-01-09 NOTE — Telephone Encounter (Signed)
Patient called for results of CT Scan-- forwarding message to medical assistant to review with provider for release.  --glh

## 2018-01-16 ENCOUNTER — Encounter: Payer: Self-pay | Admitting: Family Medicine

## 2018-01-16 ENCOUNTER — Ambulatory Visit (INDEPENDENT_AMBULATORY_CARE_PROVIDER_SITE_OTHER): Payer: Medicare HMO | Admitting: Family Medicine

## 2018-01-16 VITALS — BP 118/74 | HR 82 | Temp 98.4°F | Ht 63.0 in | Wt 172.0 lb

## 2018-01-16 DIAGNOSIS — K146 Glossodynia: Secondary | ICD-10-CM | POA: Diagnosis not present

## 2018-01-16 DIAGNOSIS — J329 Chronic sinusitis, unspecified: Secondary | ICD-10-CM

## 2018-01-16 DIAGNOSIS — K219 Gastro-esophageal reflux disease without esophagitis: Secondary | ICD-10-CM | POA: Diagnosis not present

## 2018-01-16 DIAGNOSIS — J3089 Other allergic rhinitis: Secondary | ICD-10-CM

## 2018-01-16 NOTE — Progress Notes (Signed)
Acute Care Office visit   Assessment and plan:  1. Chronic sinusitis, unspecified location   2. Tongue burning sensation   3. Gastroesophageal reflux disease, esophagitis presence not specified   4. Environmental and seasonal allergies     - Referral placed to Ear, Nose & Throat for burning tongue and decreased taste on tongue, as well as sinus congestion, sinus pressure, headache, and generalized malaise associated with ongoing symptoms.   - Viral vs Allergic vs Bacterial causes for pt's symptoms reveiwed.  Patient has been seen for these symptoms several times in clinic already.  Patient has tried antibiotics, steroid injection, oral steroids, cough medicine, magic mouthwash, etc., to alleviate symptoms without resolution.  - Advised the patient to begin using AYR or Neilmed sinus rinses BID morning and night, followed by flonase BID (one spray to each nostril). Advised that the patient may also incorporate allegra or claritin PRN.   - Continue on reflux medicines daily.  - Encouraged patient to consume adequate amounts of water.  - Supportive care and various OTC medications discussed in addition to other recommendations.  Call or RTC if new symptoms, or if no improvement or worse over next several days.      Medications Discontinued During This Encounter  Medication Reason  . magic mouthwash w/lidocaine SOLN Completed Course     Orders Placed This Encounter  Procedures  . Ambulatory referral to ENT    Gross side effects, risk and benefits, and alternatives of medications discussed with patient.  Patient is aware that all medications have potential side effects and we are unable to predict every sideeffect or drug-drug interaction that may occur.  Expresses verbal understanding and consents to current therapy plan and treatment regiment.   Education and routine counseling performed. Handouts provided.  Anticipatory guidance and routine counseling done re: condition,  txmnt options and need for follow up. All questions of patient's were answered.  Return if symptoms worsen or fail to improve, for Follow-up for chronic care as previously discussed.  Please see AVS handed out to patient at the end of our visit for additional patient instructions/ counseling done pertaining to today's office visit.  Note:  This document was partially repared using Dragon voice recognition software and may include unintentional dictation errors.  This document serves as a record of services personally performed by Mellody Dance, DO. It was created on her behalf by Toni Amend, a trained medical scribe. The creation of this record is based on the scribe's personal observations and the provider's statements to them.   I have reviewed the above medical documentation for accuracy and completeness and I concur.  Mellody Dance, DO, D.O. 01/17/2018 11:11 AM       Subjective:    Chief Complaint  Patient presents with  . Sinus Problem   Patient cannot tolerate Aricept due to GI symptoms.   HPI:  Pt presents with Sx that have now become chronic.   C/o: General upper respiratory symptoms, malaise, headaches. Feeling run down, has sinus pressure, sinus congestion. Notes "I just don't feel good."  Continues experiencing symptoms on her tongue as well. Experiencing tingling, burning, and loss of taste on her tongue. Went to the dentist because of a tooth she thought was decaying.  Denies: Fever.    For symptoms patient has tried:  Steroid injection, oral steroids, antibiotics, cough medicine. She has been seen for these symptoms several times.  Has been using salt-water for her tongue.  Overall getting: Still feeling poorly.  Patient Care Team    Relationship Specialty Notifications Start End  Mellody Dance, DO PCP - General Family Medicine  08/24/15   Barrett, Evelene Croon, PA-C Physician Assistant Cardiology  01/08/16    Comment: She works with Dr  Kirk Ruths !!!!!!  Megan Salon, MD Consulting Physician Gynecology  11/14/16     Past medical history, Surgical history, Family history reviewed and noted below, Social history, Allergies, and Medications have been entered into the medical record, reviewed and changed as needed.   Allergies  Allergen Reactions  . Ciprofloxacin Other (See Comments)    Unknown (vertigo??)  . Citalopram Hydrobromide Other (See Comments)    Intrusive/ odd thoughts   . Flagyl [Metronidazole] Other (See Comments)    Unknown??  . Oxycodone Other (See Comments)    Headaches    Review of Systems: - see above HPI for pertinent positives General:   No F/C, wt loss Pulm:   No DIB, pleuritic chest pain Card:  No CP, palpitations Abd:  No n/v/d or pain Ext:  No inc edema from baseline   Objective:   Blood pressure 118/74, pulse 82, temperature 98.4 F (36.9 C), height 5\' 3"  (1.6 m), weight 172 lb (78 kg), SpO2 98 %. Body mass index is 30.47 kg/m. General: Well Developed, well nourished, appropriate for stated age.  Neuro: Alert and oriented x3, extra-ocular muscles intact, sensation grossly intact.  HEENT: Normocephalic, atraumatic, pupils equal round reactive to light, neck supple, no masses, no painful lymphadenopathy, TM's intact B/L, no acute findings. Nares- patent, clear d/c, OP- clear, mild erythema, No TTP sinuses Skin: Warm and dry, no gross rash. Cardiac: RRR, S1 S2,  no murmurs rubs or gallops.  Respiratory: ECTA B/L and A/P, Not using accessory muscles, speaking in full sentences- unlabored. Vascular:  No gross lower ext edema, cap RF less 2 sec. Psych: No HI/SI, judgement and insight good, Euthymic mood. Full Affect.

## 2018-01-16 NOTE — Patient Instructions (Addendum)
Please talk to neurology about your inability to tolerate Aricept due to GI symptoms.

## 2018-01-20 ENCOUNTER — Other Ambulatory Visit: Payer: Medicare HMO

## 2018-01-23 DIAGNOSIS — R432 Parageusia: Secondary | ICD-10-CM | POA: Insufficient documentation

## 2018-01-23 DIAGNOSIS — K117 Disturbances of salivary secretion: Secondary | ICD-10-CM | POA: Insufficient documentation

## 2018-01-23 DIAGNOSIS — R682 Dry mouth, unspecified: Secondary | ICD-10-CM

## 2018-01-28 ENCOUNTER — Other Ambulatory Visit: Payer: Medicare HMO

## 2018-01-28 ENCOUNTER — Other Ambulatory Visit: Payer: Self-pay

## 2018-01-28 DIAGNOSIS — K146 Glossodynia: Secondary | ICD-10-CM

## 2018-01-28 NOTE — Progress Notes (Signed)
b12

## 2018-01-29 ENCOUNTER — Telehealth: Payer: Self-pay

## 2018-01-29 LAB — VITAMIN B12: VITAMIN B 12: 783 pg/mL (ref 232–1245)

## 2018-01-29 NOTE — Telephone Encounter (Signed)
Called and left message for patient to call the office. MPulliam, CMA/RT(R)  

## 2018-01-29 NOTE — Telephone Encounter (Signed)
Patient is wanting to know if OTC B12 will do as much for her as monthly injections. Patient is not currently taking B12 and her level drawn yesterday 01/28/2018 was 783. Please review and advised.

## 2018-01-29 NOTE — Telephone Encounter (Signed)
Notified the patient.  She wants to know if B12 can cause the issues with her mouth and excessive sweating. MPulliam, CMA/RT(R)

## 2018-01-29 NOTE — Telephone Encounter (Signed)
No over-the-counter will not do as much as her monthly injections

## 2018-01-29 NOTE — Telephone Encounter (Signed)
Would be unlikely

## 2018-02-03 ENCOUNTER — Telehealth: Payer: Self-pay | Admitting: Family Medicine

## 2018-02-03 NOTE — Telephone Encounter (Signed)
Called patient left message to call the office. MPulliam, CMA/RT(R)  

## 2018-02-03 NOTE — Telephone Encounter (Signed)
Called patient left message to call the office. Marland Kitchenmp

## 2018-02-03 NOTE — Telephone Encounter (Signed)
Patient called states notices has bad headaches when she takes this Rx: --- ( Patient request a different type of Sleep Rx)  traZODone (DESYREL) 50 MG tablet [[id number]] DISCONTINUED   Order Details  Dose: 50 mg Route: Oral Frequency: At bedtime PRN for sleep  Indications of Use: Insomnia  Dispense Quantity: 90 tablet Refills: 0 Fills remaining: --        Sig: Take 1 tablet (50 mg total) by mouth at bedtime as needed for sleep.       Discontinue Date:  09/27/2017 11:17 Discontinue User:  Mellody Dance, DO Discontinue Reason:  --     --Forwarding message to medical assistant to contact her regarding request.--glh

## 2018-02-04 ENCOUNTER — Other Ambulatory Visit: Payer: Self-pay | Admitting: Family Medicine

## 2018-02-04 ENCOUNTER — Ambulatory Visit (INDEPENDENT_AMBULATORY_CARE_PROVIDER_SITE_OTHER): Payer: Medicare HMO | Admitting: Family Medicine

## 2018-02-04 ENCOUNTER — Encounter: Payer: Self-pay | Admitting: Family Medicine

## 2018-02-04 VITALS — BP 105/73 | HR 101 | Temp 98.3°F | Ht 63.0 in | Wt 168.5 lb

## 2018-02-04 DIAGNOSIS — E538 Deficiency of other specified B group vitamins: Secondary | ICD-10-CM | POA: Diagnosis not present

## 2018-02-04 DIAGNOSIS — J329 Chronic sinusitis, unspecified: Secondary | ICD-10-CM

## 2018-02-04 DIAGNOSIS — K219 Gastro-esophageal reflux disease without esophagitis: Secondary | ICD-10-CM

## 2018-02-04 DIAGNOSIS — F5105 Insomnia due to other mental disorder: Secondary | ICD-10-CM

## 2018-02-04 DIAGNOSIS — R531 Weakness: Secondary | ICD-10-CM

## 2018-02-04 DIAGNOSIS — F39 Unspecified mood [affective] disorder: Secondary | ICD-10-CM | POA: Diagnosis not present

## 2018-02-04 DIAGNOSIS — K146 Glossodynia: Secondary | ICD-10-CM

## 2018-02-04 MED ORDER — ESCITALOPRAM OXALATE 20 MG PO TABS: ORAL_TABLET | ORAL | 1 refills | Status: DC

## 2018-02-04 MED ORDER — RAMELTEON 8 MG PO TABS: 8.0000 mg | ORAL_TABLET | Freq: Every day | ORAL | 1 refills | Status: DC

## 2018-02-04 MED ORDER — CYANOCOBALAMIN 2000 MCG PO TABS: 2000.0000 ug | ORAL_TABLET | Freq: Every day | ORAL | Status: DC

## 2018-02-04 NOTE — Progress Notes (Signed)
Impression and Recommendations:    1. Mood disorder ([place])   2. Insomnia due to mental condition   3. B12 deficiency   4. Tongue burning sensation   5. Chronic sinusitis, unspecified location   6. Gastroesophageal reflux disease, esophagitis presence not specified   7. Weakness      1. B12 Supplementation - Advised patient to continue B12 supplementation as prescribed.  - Take 1000 to 2000 mcg B12 daily.  Re-check as recommended in 3-4 months.  - Extensively educated patient about B12 deficiency in office today.  - Discussed that antacid use can cause B12 deficiency, and the patient does use antacids.  2. Burning of the Tongue - Patient's symptoms continue.  She has followed up as recommended. - Advised patient to follow up with indicated specialist as recommended.  - Continue following treatment plan and recommendations as prescribed.  - Reviewed that there are other deficiencies to check for.  Patient agrees to try this "if it doesn't get better."  3. Chronic Sweating - Onset Spring of Last Year - Symptoms continue unabated.  - Reviewed and educated patient extensively regarding potential causes of her sweating.    - Patient will look up what medication she began prior to onset of her chronic sweating.    - Discussed that medications can cause various side effects in people.  Reviewed that every medicine involves risks and benefits.  If patient desires to stop a medication, she knows to discuss it with [place] prior.  - Will continue to monitor.  4. GI Concerns - Follows up with Dr. Cristina Gong - Advised patient to continue following recommendations of GI for her stool/diarrhea concerns.  - To thicken the stool, continue to use fiber supplements as recommended per specialty.  5. Insomnia - Sx not optimally managed at this time due to noncompliance. - Patient discontinued her trazodone 2-3 weeks ago.  - Discussed other options to control insomnia with patient  today.  6. Dementia - Followed by Neurology - Patient discontinued her Aricept. - Instructed patient to resume use of Aricept.  - Continue to follow up with specialist as prescribed and recommended.  7. GERD - Begin using famotidine/Pepcid or cimetidine to replace ranitidine.  - Extensively counseled patient in office today.  Discussed the family of drugs within the same group as ranitidine as options to control her GERD.  8. Mood - Patient desires to discontinue zoloft and resume lexapro, which she was managed on in the past.  Per patient, she believes her zoloft is causing her chronic sweating.  - Per patient, she was on lexapro for many months.  Patient agrees to taking lexapro and states that she tolerated it well in the past.  9. Lifestyle & Preventative Health Maintenance - Advised patient to continue working toward exercising to improve overall mental, physical, and emotional health.    - Reviewed the "spokes of the wheel" of mood and health management.  Stressed the importance of ongoing prudent habits, including regular exercise, appropriate sleep hygiene, healthful dietary habits, and prayer/meditation to relax.  - Encouraged patient to engage in daily physical activity, especially a formal exercise routine.  Recommended that the patient eventually strive for at least 150 minutes of moderate cardiovascular activity per week according to guidelines established by the [facility].   - Healthy dietary habits encouraged, including low-carb, and high amounts of lean protein in diet.   - Patient should also consume adequate amounts of water.  Health counseling performed.  All questions  answered.  32.5+ minutes were spent in patient care, with over 50% time spent in face to face counseling of patients various medical conditions, treatment plans of those medical conditions including medicine management and lifestyle modification, strategies to improve health and well being; and in  coordination of care. SEE ABOVE TREATMENT PLAN FOR DETAILS   Meds ordered this encounter  Medications  . ramelteon (ROZEREM) 8 MG tablet    Sig: Take 1 tablet (8 mg total) by mouth at bedtime.    Dispense:  30 tablet    Refill:  1  . cyanocobalamin ([facility] VITAMIN B12) 2000 MCG tablet    Sig: Take 1 tablet (2,000 mcg total) by mouth daily.  [name] DISCONTD: escitalopram (LEXAPRO) 20 MG tablet    Sig: One half tab daily for 4 wks then one tab by mouth daily    Dispense:  90 tablet    Refill:  1    Medications Discontinued During This Encounter  Medication Reason  . ranitidine (ZANTAC) 300 MG tablet   . sertraline (ZOLOFT) 50 MG tablet      Gross side effects, risk and benefits, and alternatives of medications and treatment plan in general discussed with patient.  Patient is aware that all medications have potential side effects and we are unable to predict every side effect or drug-drug interaction that may occur.   Patient will call with any questions prior to using medication if they have concerns.    Expresses verbal understanding and consents to current therapy and treatment regimen.  No barriers to understanding were identified.  Red flag symptoms and signs discussed in detail.  Patient expressed understanding regarding what to do in case of emergency\urgent symptoms  Please see AVS handed out to patient at the end of our visit for further patient instructions/ counseling done pertaining to today's office visit.   Return for 2) f/up 6wks- new sleep med rozerem, exercise habits.     Note:  This note was prepared with assistance of Dragon voice recognition software. Occasional wrong-word or sound-a-like substitutions may have occurred due to the inherent limitations of voice recognition software.   This document serves as a record of services personally performed by Mellody Dance, DO. It was created on her behalf by Toni Amend, a trained medical scribe. The creation of this  record is based on the scribe's personal observations and the provider's statements to them.   I have reviewed the above medical documentation for accuracy and completeness and I concur.  Mellody Dance, DO       ----------------------------------------------------------------------------------------------------------------------------------    Subjective:     HPI: Frances Hoffman is a 77 y.o. female who presents to [place] [facility] today for issues as discussed below.  She is not exercising at the moment.  States "I don't feel like it."  When she exercises, she goes to the [facility].  Chronic Sweating Had a sinus infection and a fever over Thanksgiving.  States she feels run down, "sweating like a [place]."  Has had chronic sweating starting last spring, April, May of last year, "sometime in Spring."  Notes "I get in bed and [the sweat] just rolls, when I'm doing nothing."  GI - Diarrhea She has never had gastric bypass.  Notes she has issues with diarrhea, and loves her GI doctor, Dr. Cristina Gong.  She visits him again in January.  She was told by GI that she needs another colonoscopy by age 55.  Burning on Tongue - B12 Supplementation  Continues having burning sensation in her tongue.  Went to follow up with a specialist about it.  She was told by the specialist that he thought she needed B12.  She received a shot for this and has been taking supplementation.  She has had one monthly B12 injection, about 3 weeks ago.  Her insurance pays for 80% of the cost.  Patient hopes that her tongue burning will resolve with the B12 supplementation.    Notes she hasn't had a Pepsi in weeks because "they just don't taste good."  Says "nothing tastes good."    Dementia, followed by Neurology - Pt Discontinued Aricept Patient discontinued use of her Aricept.  She was started on this by neurology.  Notes it's been a few weeks since she stopped taking it.  Insomnia - Pt  Discontinued Trazodone She stopped taking her trazodone 2-3 weeks ago.  She has been taking Tylenol PM as a sleep aid, for at least a week.  She notes she is sleeping well on the Tylenol PM.  For sleep, patient "would rather take something else," but didn't know what to take.  Zoloft Use - Mood Management Continues on sertraline as prescribed.   Wt Readings from Last 3 Encounters:  02/04/18 168 lb 8 oz (76.4 kg)  01/16/18 172 lb (78 kg)  12/31/17 174 lb (78.9 kg)   BP Readings from Last 3 Encounters:  02/04/18 105/73  01/16/18 118/74  12/31/17 131/75   Pulse Readings from Last 3 Encounters:  02/04/18 (!) 101  01/16/18 82  12/31/17 60   BMI Readings from Last 3 Encounters:  02/04/18 29.85 kg/m  01/16/18 30.47 kg/m  12/31/17 30.82 kg/m     Patient Care Team    Relationship Specialty Notifications Start End  Mellody Dance, DO PCP - General Family Medicine  08/24/15   Barrett, Evelene Croon, PA-C Physician Assistant Cardiology  01/08/16    Comment: 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  Megan Salon, [place] Physician Gynecology  11/14/16      Patient Active Problem List   Diagnosis Date Noted  . Breast cancer ([place])- L breast 2000 11/14/2016    Priority: High  . Hyperlipidemia 12/12/2007    Priority: High  . Adjustment disorder with mixed anxiety and depressed mood 12/12/2007    Priority: High  . Depression 12/23/2015    Priority: Medium  . Overweight 03/15/2015    Priority: Medium  . Insomnia- due to stress 01/16/2011    Priority: Medium  . Hypothyroidism 12/12/2007    Priority: Medium  . Chronic GERD 12/12/2007    Priority: Medium  . Leg cramping 02/10/2016    Priority: Low  . Asx Hypotension 09/25/2015    Priority: Low  . Fatigue 08/15/2009    Priority: Low  . Osteopenia 11/26/2008    Priority: Low  . B12 deficiency 02/04/2018  . Mood disorder ([place]) 02/04/2018  . Xerostomia 01/23/2018  . Dysgeusia 01/23/2018  . Tongue burning sensation  01/16/2018  . Weakness 12/31/2017  . Hypoxia 12/31/2017  . Ill feeling 12/31/2017  . Persistent dry cough 12/20/2017  . Post-nasal drainage 12/20/2017  . Medication monitoring encounter 12/20/2017  . Chronic sinusitis 12/20/2017  . Chronic kidney disease (CKD) stage G3a/A1, moderately decreased glomerular filtration rate (GFR) between 45-59 mL/min/1.73 square meter and albuminuria creatinine ratio less than 30 mg/g (HCC) 11/28/2017  . Mild dehydration 11/28/2017  . Cough -  2-3 mo  11/28/2017  . Environmental and seasonal allergies 11/28/2017  . Hearing loss and  HA due to cerumen impaction, bilateral 10/10/2017  . Nonintractable headache 10/10/2017  . Vascular dementia without behavioral disturbance ([place]) 09/27/2017  . Memory difficulties 09/27/2017  . Prediabetes 09/27/2017  . Mild cognitive impairment 07/02/2017  . Cerebral vascular disease 07/02/2017  . Chronic insomnia 07/02/2017  . Primary osteoarthritis of first carpometacarpal joint of left hand 01/09/2017  . Obesity, Class I, BMI 30-34.9 08/19/2016  . 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 Chest pain 10/26/2015  . Fecal incontinence 07/29/2015  . Heme positive stool 07/29/2015  . Leg pain 06/06/2015  . Routine general medical examination at a health care facility 04/24/2015  . Memory loss 09/22/2014  . Exercise intolerance 07/30/2014  . Estrogen deficiency 04/28/2014  . Tachycardia 04/03/2014  . Family history of heart disease 08/27/2013  . Dyspnea on exertion 08/18/2013  . Positional vertigo 08/18/2013  . Encounter for Medicare annual wellness exam 11/23/2011  . Other screening mammogram 11/23/2011  . Left sided abdominal sub-Q tissue pain of unknown cause/ ( dx as L sided pelvic pain in past) 12/13/2009  . WEIGHT GAIN 01/15/2008  . ADHESIVE CAPSULITIS 12/12/2007    Past Medical history, Surgical history, Family history, Social history, Allergies and Medications have been entered into the medical record,  reviewed and changed as needed.    Current Meds  Medication Sig  . amoxicillin-clavulanate (AUGMENTIN) 875-125 MG tablet Take 1 tablet by mouth 2 (two) times daily.  [name] atorvastatin (LIPITOR) 20 MG tablet Take 1 tablet (20 mg total) by mouth at bedtime.  [name] b complex vitamins tablet Take 1 tablet by mouth daily.  . calcium carbonate (OS-CAL - DOSED IN MG OF ELEMENTAL CALCIUM) 1250 (500 CA) MG tablet Take 1 tablet by mouth daily.  . carboxymethylcellulose (REFRESH PLUS) 0.5 % SOLN Place 1 drop into both eyes 3 (three) times daily as needed (for dryness).  . Cholecalciferol (VITAMIN D3) 2000 UNITS capsule Take 2,000 Units by mouth daily.    . Cranberry 125 MG TABS Take 2 tablets by mouth.  . fluticasone (FLONASE) 50 MCG/ACT nasal spray 1 spray 2 times daily after sinus rinses- Milta Deiters med or AYR  . levothyroxine (SYNTHROID, LEVOTHROID) 75 MCG tablet Take 1 tablet (75 mcg total) by mouth daily.  [name] liothyronine (CYTOMEL) 5 MCG tablet Take 1 tablet (5 mcg total) by mouth daily.  . Multiple Vitamin (MULTIVITAMIN) tablet Take 1 tablet by mouth daily.    . Omega-3 Fatty Acids (FISH OIL PO) Take 2 capsules by mouth 2 (two) times daily.  . vitamin C (ASCORBIC ACID) 500 MG tablet Take 500 mg by mouth daily.  . [DISCONTINUED] ranitidine (ZANTAC) 300 MG tablet Take 0.5 tablets (150 mg total) by mouth 2 (two) times daily.  . [DISCONTINUED] sertraline (ZOLOFT) 50 MG tablet 1 tablet daily    Allergies:  Allergies  Allergen Reactions  . Ciprofloxacin Other (See Comments)    Unknown (vertigo??)  . Citalopram Hydrobromide Other (See Comments)    Intrusive/ odd thoughts   . Flagyl [Metronidazole] Other (See Comments)    Unknown??  . Oxycodone Other (See Comments)    Headaches     Review of Systems:  A fourteen system review of systems was performed and found to be positive as per HPI.   Objective:   Blood pressure 105/73, pulse (!) 101, temperature 98.3 F (36.8 C), height 5\' 3"  (1.6 m), weight 168  lb 8 oz (76.4 kg), SpO2 98 %. Body mass index is 29.85 kg/m. General:  Well Developed, well nourished, appropriate for stated age.  Neuro:  Alert and oriented,  extra-ocular muscles intact  HEENT:  Normocephalic, atraumatic, neck supple, no carotid bruits appreciated  Skin:  no gross rash, warm, pink. Cardiac:  RRR, S1 S2 Respiratory:  ECTA B/L and A/P, Not using accessory muscles, speaking in full sentences- unlabored. Vascular:  Ext warm, no cyanosis apprec.; cap RF less 2 sec. Psych:  No HI/SI, judgement and insight good, Euthymic mood. Full Affect.

## 2018-02-04 NOTE — Telephone Encounter (Signed)
Patient has appointment today. MPulliam, CMA/RT(R)

## 2018-02-04 NOTE — Patient Instructions (Addendum)
Instead of your Zantac, please take over-the-counter Pepcid or Tagamet.  Please try to take this as far away from when you take your B12 as these medicines can affect the uptake of B12.  -Also please go to 1/2 tablet of your Zoloft for 4 weeks then go off.  At the same time you are going to go on half a tablet of your Lexapro for 1 month or 4 weeks then go to 1 full tablet after 4 weeks.   -I will see you in 6 weeks to see how you are doing     If you have insomnia or difficulty sleeping, this information is for you:  - Avoid caffeinated beverages after lunch,  no alcoholic beverages,  no eating within 2-3 hours of lying down,  avoid exposure to blue light before bed,  avoid daytime naps, and  needs to maintain a regular sleep schedule- go to sleep and wake up around the same time every night.   - Resolve concerns or worries before entering bedroom:  Discussed relaxation techniques with patient and to keep a journal to write down fears\ worries.  I suggested seeing a counselor for CBT.   - Recommend patient meditate or do deep breathing exercises to help relax.   Incorporate the use of white noise machines or listen to "sleep meditation music", or recordings of guided meditations for sleep from YouTube which are free, such as  "guided meditation for detachment from over thinking"  by Mayford Knife.         Please realize, EXERCISE IS MEDICINE!  -  American Heart Association [facility]) guidelines for exercise : If you are in good health, without any medical conditions, you should engage in 150-300 minutes of moderate intensity aerobic activity per week.  This means you should be huffing and puffing throughout your workout.   Engaging in regular exercise will improve brain function and memory, as well as improve mood, boost immune system and help with weight management.  As well as the other, more well-known effects of exercise such as decreasing blood sugar levels, decreasing blood pressure,   and decreasing bad cholesterol levels/ increasing good cholesterol levels.     -  The AHA strongly endorses consumption of a diet that contains a variety of foods from all the food categories with an emphasis on fruits and vegetables; fat-free and low-fat dairy products; cereal and grain products; legumes and nuts; and fish, poultry, and/or extra lean meats.    Excessive food intake, especially of foods high in saturated and trans fats, sugar, and salt, should be avoided.    Adequate water intake of roughly 1/2 of your weight in pounds, should equal the ounces of water per day you should drink.  So for instance, if you're 200 pounds, that would be 100 ounces of water per day.         Mediterranean Diet  Why follow it? Research shows. . Those who follow the Mediterranean diet have a reduced risk of heart disease  . The diet is associated with a reduced incidence of Parkinson's and Alzheimer's diseases . People following the diet may have longer life expectancies and lower rates of chronic diseases  . The Dietary Guidelines for Americans recommends the Mediterranean diet as an eating plan to promote health and prevent disease  What Is the Mediterranean Diet?  . Healthy eating plan based on typical foods and recipes of Mediterranean-style cooking . The diet is primarily a plant based diet; these foods should make  up a majority of meals   Starches - Plant based foods should make up a majority of meals - They are an important sources of vitamins, minerals, energy, antioxidants, and fiber - Choose whole grains, foods high in fiber and minimally processed items  - Typical grain sources include wheat, oats, barley, corn, brown rice, bulgar, farro, millet, polenta, couscous  - Various types of beans include chickpeas, lentils, fava beans, black beans, white beans   Fruits  Veggies - Large quantities of antioxidant rich fruits & veggies; 6 or more servings  - Vegetables can be eaten raw or lightly  drizzled with oil and cooked  - Vegetables common to the traditional Mediterranean Diet include: artichokes, arugula, beets, broccoli, brussel sprouts, cabbage, carrots, celery, collard greens, cucumbers, eggplant, kale, leeks, lemons, lettuce, mushrooms, okra, onions, peas, peppers, potatoes, pumpkin, radishes, rutabaga, shallots, spinach, sweet potatoes, turnips, zucchini - Fruits common to the Mediterranean Diet include: apples, apricots, avocados, cherries, clementines, dates, figs, grapefruits, grapes, melons, nectarines, oranges, peaches, pears, pomegranates, strawberries, tangerines  Fats - Replace butter and margarine with healthy oils, such as olive oil, canola oil, and tahini  - Limit nuts to no more than a handful a day  - Nuts include walnuts, almonds, pecans, pistachios, pine nuts  - Limit or avoid candied, honey roasted or heavily salted nuts - Olives are central to the [employer] - can be eaten whole or used in a variety of dishes   Meats Protein - Limiting red meat: no more than a few times a month - When eating red meat: choose lean cuts and keep the portion to the size of deck of cards - Eggs: approx. 0 to 4 times a week  - Fish and lean poultry: at least 2 a week  - Healthy protein sources include, chicken, [place], lean beef, lamb - Increase intake of seafood such as tuna, salmon, trout, mackerel, shrimp, scallops - Avoid or limit high fat processed meats such as sausage and bacon  Dairy - Include moderate amounts of low fat dairy products  - Focus on healthy dairy such as fat free yogurt, skim milk, low or reduced fat cheese - Limit dairy products higher in fat such as whole or 2% milk, cheese, ice cream  Alcohol - Moderate amounts of red wine is ok  - No more than 5 oz daily for women (all ages) and men older than age 40  - No more than 10 oz of wine daily for men younger than 20  Other - Limit sweets and other desserts  - Use herbs and spices instead of salt to  flavor foods  - Herbs and spices common to the traditional Mediterranean Diet include: basil, bay leaves, chives, cloves, cumin, fennel, garlic, lavender, marjoram, mint, oregano, parsley, pepper, rosemary, sage, savory, sumac, tarragon, thyme   It's not just a diet, it's a lifestyle:  . The Mediterranean diet includes lifestyle factors typical of those in the region  . Foods, drinks and meals are best eaten with others and savored . Daily physical activity is important for overall good health . This could be strenuous exercise like running and aerobics . This could also be more leisurely activities such as walking, housework, yard-work, or taking the stairs . Moderation is the key; a balanced and healthy diet accommodates most foods and drinks . Consider portion sizes and frequency of consumption of certain foods   Meal Ideas & Options:  . Breakfast:  o Whole wheat toast or whole wheat English muffins  with peanut butter & hard boiled egg o Steel cut oats topped with apples & cinnamon and skim milk  o Fresh fruit: banana, strawberries, melon, berries, peaches  o Smoothies: strawberries, bananas, greek yogurt, peanut butter o Low fat greek yogurt with blueberries and granola  o Egg white omelet with spinach and mushrooms o Breakfast couscous: whole wheat couscous, apricots, skim milk, cranberries  . Sandwiches:  o Hummus and grilled vegetables (peppers, zucchini, squash) on whole wheat bread   o Grilled chicken on whole wheat pita with lettuce, tomatoes, cucumbers or tzatziki  o Tuna salad on whole wheat bread: tuna salad made with greek yogurt, olives, red peppers, capers, green onions o Garlic rosemary lamb pita: lamb sauted with garlic, rosemary, salt & pepper; add lettuce, cucumber, greek yogurt to pita - flavor with lemon juice and black pepper  . Seafood:  o Mediterranean grilled salmon, seasoned with garlic, basil, parsley, lemon juice and black pepper o Shrimp, lemon, and spinach  whole-grain pasta salad made with low fat greek yogurt  o Seared scallops with lemon orzo  o Seared tuna steaks seasoned salt, pepper, coriander topped with tomato mixture of olives, tomatoes, olive oil, minced garlic, parsley, green onions and cappers  . Meats:  o Herbed greek chicken salad with kalamata olives, cucumber, feta  o Red bell peppers stuffed with spinach, bulgur, lean ground beef (or lentils) & topped with feta   o Kebabs: skewers of chicken, tomatoes, onions, zucchini, squash  o [place] burgers: made with red onions, mint, dill, lemon juice, feta cheese topped with roasted red peppers . Vegetarian o Cucumber salad: cucumbers, artichoke hearts, celery, red onion, feta cheese, tossed in olive oil & lemon juice  o Hummus and whole grain pita points with a greek salad (lettuce, tomato, feta, olives, cucumbers, red onion) o Lentil soup with celery, carrots made with vegetable broth, garlic, salt and pepper  o Tabouli salad: parsley, bulgur, mint, scallions, cucumbers, tomato, radishes, lemon juice, olive oil, salt and pepper.

## 2018-02-05 ENCOUNTER — Telehealth: Payer: Self-pay | Admitting: Family Medicine

## 2018-02-05 ENCOUNTER — Other Ambulatory Visit: Payer: Self-pay

## 2018-02-05 DIAGNOSIS — F39 Unspecified mood [affective] disorder: Secondary | ICD-10-CM

## 2018-02-05 MED ORDER — ESCITALOPRAM OXALATE 20 MG PO TABS: ORAL_TABLET | ORAL | 1 refills | Status: DC

## 2018-02-05 MED ORDER — MIRTAZAPINE 15 MG PO TABS: 15.0000 mg | ORAL_TABLET | Freq: Every day | ORAL | 1 refills | Status: DC

## 2018-02-05 NOTE — Telephone Encounter (Signed)
Let us try mirtazapine 15 mg 1 p.o. Nightly Dispense 30, 1 refill -Please call patient let her know I need to see her before I will get further refills so I can make sure she is doing well on this medication.  Please let her know that this will not only help with her insomnia but also is an antidepressant type medicine

## 2018-02-05 NOTE — Addendum Note (Signed)
Addended by: Lanier Prude D on: 02/05/2018 05:42 PM   Modules accepted: Orders

## 2018-02-05 NOTE — Telephone Encounter (Signed)
Resent to [place] and patient notified. MPulliam, CMA/RT(R)

## 2018-02-05 NOTE — Telephone Encounter (Signed)
Sent in medication and notified the patient. MPulliam, CMA/RT(R)

## 2018-02-05 NOTE — Telephone Encounter (Signed)
Patient had a prescription for Lexapro sent to [facility] but the price was going to be over $100 with insurance, she contacted [facility] and they will give her a better rate for this med. She is requesting that this med be resubmitted to [facility] if possible.

## 2018-02-06 ENCOUNTER — Telehealth: Payer: Self-pay | Admitting: *Deleted

## 2018-02-06 NOTE — Telephone Encounter (Signed)
Patient called today with reports of recent onset of frequent headaches and feels it is related to her Aricept.  According to her chart, Aricept was started in April 2019.  She tells me she had stopped the medication and her PCP just instructed her to restart it.  She wants to stay on the medication and has decided to give it more time.  I told her we are happy to see her to discuss medication and her headaches.  She declined for now.  States she may call her PCP.

## 2018-02-11 ENCOUNTER — Other Ambulatory Visit: Payer: Self-pay

## 2018-02-11 ENCOUNTER — Telehealth: Payer: Self-pay | Admitting: Family Medicine

## 2018-02-11 DIAGNOSIS — F5104 Psychophysiologic insomnia: Secondary | ICD-10-CM

## 2018-02-11 MED ORDER — MIRTAZAPINE 15 MG PO TABS: 15.0000 mg | ORAL_TABLET | Freq: Every day | ORAL | 1 refills | Status: DC

## 2018-02-11 NOTE — Telephone Encounter (Signed)
Patient had been prescribed trazodone in the past by Dr. Jenetta Downer for sleep and she currently is almost out with no refills available. Patient is requesting a new order to be sent to [facility] is approved.

## 2018-02-11 NOTE — Telephone Encounter (Signed)
Reviewed chart patient was seen on 02/04/18 and Trazodone was d/c due to headaches, rx was written for Ramelteon but the medication was too expensive. Medication was changed to Remeron.  Patient has not started medication.  Patient advised to start new medication and follow up. MPulliam, CMA/RT(R)

## 2018-02-13 ENCOUNTER — Telehealth: Payer: Self-pay

## 2018-02-13 ENCOUNTER — Telehealth: Payer: Self-pay | Admitting: Family Medicine

## 2018-02-13 DIAGNOSIS — F5104 Psychophysiologic insomnia: Secondary | ICD-10-CM

## 2018-02-13 NOTE — Telephone Encounter (Signed)
Patient called stats she had OV with Dr.Wolicki who thinks tongue issue maybe  ( Vit B deficiency) -- advised pt Dr. Jenetta Downer did testing for that on 01/28/18.  -----  Forward msg to medical assistant to review & or contact pt to discuss lab results.  --glh

## 2018-02-13 NOTE — Telephone Encounter (Signed)
Patient called and is concerned with drug interactions with the Mirtazapine that was written for on 02/11/2018 patient is requesting to go back on the trazodone.  LOV 02/04/2018.  Please review and advise. MPulliam, CMA/RT(R)

## 2018-02-14 NOTE — Telephone Encounter (Signed)
Called and spoke to patient and she states that she is currently taking B-Complex and has added an additional B12 and has been taking both for abt 2-3 weeks and has not seen any change.  Please review and advise. MPulliam, CMA/RT(R)

## 2018-02-17 MED ORDER — TRAZODONE HCL 50 MG PO TABS: 50.0000 mg | ORAL_TABLET | Freq: Every evening | ORAL | 1 refills | Status: DC | PRN

## 2018-02-17 NOTE — Telephone Encounter (Signed)
Sent in medication per. Dr. Hershal Coria note.  Patient notified and expressed understanding. MPulliam, CMA/RT(R)

## 2018-02-17 NOTE — Telephone Encounter (Signed)
Please document how much pt is taking of her B12.      Also, let pt know that we checked her B12 recently and it was WNL's.    If Dr Viona Gilmore thinks other issues could be causing her sx, please encourage pt to f/up with her so she may get additional appropriate lab tests etc.

## 2018-02-17 NOTE — Telephone Encounter (Signed)
Spoke to patient she states that she is taking 2500 mcg of the B12 daily.  I notified the patient to follow up with Dr. Erik Obey and I have sent lab results for b12 over to their office.  Patient is aware. MPulliam, CMA/RT(R)

## 2018-02-17 NOTE — Telephone Encounter (Signed)
Ok to D/c Mirtazapine and add back on trazadone at 50mg  1 po qhs, disp 90, w 1 rf.

## 2018-02-23 ENCOUNTER — Other Ambulatory Visit: Payer: Self-pay

## 2018-02-23 ENCOUNTER — Emergency Department (HOSPITAL_COMMUNITY)
Admission: EM | Admit: 2018-02-23 | Discharge: 2018-02-23 | Disposition: A | Discharge status: Home / Self Care | Payer: Medicare HMO | Attending: Emergency Medicine | Admitting: Emergency Medicine

## 2018-02-23 ENCOUNTER — Encounter (HOSPITAL_COMMUNITY): Payer: Self-pay

## 2018-02-23 DIAGNOSIS — Z79899 Other long term (current) drug therapy: Secondary | ICD-10-CM | POA: Insufficient documentation

## 2018-02-23 DIAGNOSIS — R51 Headache: Secondary | ICD-10-CM | POA: Insufficient documentation

## 2018-02-23 DIAGNOSIS — E039 Hypothyroidism, unspecified: Secondary | ICD-10-CM | POA: Diagnosis not present

## 2018-02-23 DIAGNOSIS — N183 Chronic kidney disease, stage 3 (moderate): Secondary | ICD-10-CM | POA: Diagnosis not present

## 2018-02-23 DIAGNOSIS — R112 Nausea with vomiting, unspecified: Secondary | ICD-10-CM | POA: Diagnosis present

## 2018-02-23 DIAGNOSIS — N3000 Acute cystitis without hematuria: Secondary | ICD-10-CM | POA: Diagnosis not present

## 2018-02-23 LAB — URINALYSIS, ROUTINE W REFLEX MICROSCOPIC
Bilirubin Urine: NEGATIVE
Glucose, UA: NEGATIVE mg/dL
Hgb urine dipstick: NEGATIVE
Ketones, ur: NEGATIVE mg/dL
Nitrite: POSITIVE — AB
Protein, ur: NEGATIVE mg/dL
Specific Gravity, Urine: 1.018 (ref 1.005–1.030)
WBC, UA: >50 WBC/hpf — ABNORMAL HIGH (ref 0–5)
pH: 6 (ref 5.0–8.0)

## 2018-02-23 MED ORDER — CEPHALEXIN 500 MG PO CAPS: 500.0000 mg | ORAL_CAPSULE | Freq: Two times a day (BID) | ORAL | 0 refills | Status: DC

## 2018-02-23 MED ORDER — FLUCONAZOLE 150 MG PO TABS: 150.0000 mg | ORAL_TABLET | Freq: Every day | ORAL | 1 refills | Status: DC

## 2018-02-23 MED ORDER — ONDANSETRON 8 MG PO TBDP: 8.0000 mg | ORAL_TABLET | Freq: Three times a day (TID) | ORAL | 0 refills | Status: DC | PRN

## 2018-02-23 MED ORDER — ONDANSETRON 8 MG PO TBDP
8.0000 mg | ORAL_TABLET | Freq: Once | ORAL | Status: AC
  Administered 2018-02-23: 8 mg via ORAL
  Filled 2018-02-23: qty 1

## 2018-02-23 MED ORDER — ACETAMINOPHEN 325 MG PO TABS
650.0000 mg | ORAL_TABLET | Freq: Once | ORAL | Status: AC
  Administered 2018-02-23: 650 mg via ORAL
  Filled 2018-02-23: qty 2

## 2018-02-23 NOTE — ED Provider Notes (Signed)
[facility] DEPT Provider Note   CSN: [id number] Arrival date & time: 02/23/18  1307     History   Chief Complaint Chief Complaint  Patient presents with  . flu like symptoms    HPI Frances Hoffman is a 77 y.o. female.  HPI Frances Hoffman is a 77 y.o. female presents to emergency department with complaint of nausea, vomiting, diarrhea, sinus headache.  Acute symptoms started 2 days ago.  Patient states she has had sinus infection for the last month.  She states she went to urgent care about 3 weeks ago and was treated with antibiotics and sinus washes.  She still doing a sinus wash.  She reports pain between her eyes.  She reports some nasal drainage but denies any colored productive nasal discharge.  She denies any sore throat or cough.  She denies any fever or chills.  She states she went to church today and was not feeling well there and decided to come here, on the way here she had several episodes of emesis.  She states this morning she had episode of diarrhea that was watery.  She had one episode of emesis last night.  She states she was able to keep down boost and fluids this morning.  She denies any abdominal pain.  No chest pain.  No shortness of breath.  She took Alka-Seltzer cold and flu today early this morning.  States nothing is making her symptoms better or worse.  Past Medical History:  Diagnosis Date  . Adjustment disorder with mixed anxiety and depressed mood 12/12/2007   Qualifier: Diagnosis of  By: Copland MD, Frederico Hamman    . Cancer ([place]) 2000   breast had lumpectomy/radiation x36,mammo ,no chemo  . Cataract of both eyes   . Depression   . Dysuria-frequency syndrome    takes AZO  . Frozen shoulder   . Gall stones   . GERD 12/12/2007   Qualifier: Diagnosis of  By: Copland MD, Frederico Hamman    . Hyperlipidemia   . Hypothyroidism 12/12/2007   Qualifier: Diagnosis of  By: Copland MD, Frederico Hamman    . Memory loss   . Osteopenia    BMD 2004, WNL  2008  . Personal history of radiation therapy   . Pneumonia   . Recurrent UTI   . Shingles    chronic body pain, left side of body  . Shortness of breath dyspnea    when climbing stairs only  . Wears glasses     Patient Active Problem List   Diagnosis Date Noted  . B12 deficiency 02/04/2018  . Mood disorder ([place]) 02/04/2018  . Xerostomia 01/23/2018  . Dysgeusia 01/23/2018  . Tongue burning sensation 01/16/2018  . Weakness 12/31/2017  . Hypoxia 12/31/2017  . Ill feeling 12/31/2017  . Persistent dry cough 12/20/2017  . Post-nasal drainage 12/20/2017  . Medication monitoring encounter 12/20/2017  . Chronic sinusitis 12/20/2017  . Chronic kidney disease (CKD) stage G3a/A1, moderately decreased glomerular filtration rate (GFR) between 45-59 mL/min/1.73 square meter and albuminuria creatinine ratio less than 30 mg/g (HCC) 11/28/2017  . Mild dehydration 11/28/2017  . Cough -  2-3 mo  11/28/2017  . Environmental and seasonal allergies 11/28/2017  . Hearing loss and HA due to cerumen impaction, bilateral 10/10/2017  . Nonintractable headache 10/10/2017  . Vascular dementia without behavioral disturbance ([place]) 09/27/2017  . Memory difficulties 09/27/2017  . Prediabetes 09/27/2017  . Mild cognitive impairment 07/02/2017  . Cerebral vascular disease 07/02/2017  . Chronic insomnia  07/02/2017  . Primary osteoarthritis of first carpometacarpal joint of left hand 01/09/2017  . Breast cancer ([place])- L breast 2000 11/14/2016  . Obesity, Class I, BMI 30-34.9 08/19/2016  . Leg cramping 02/10/2016  . Depression 12/23/2015  . 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 Chest pain 10/26/2015  . Asx Hypotension 09/25/2015  . Fecal incontinence 07/29/2015  . Heme positive stool 07/29/2015  . Leg pain 06/06/2015  . Routine general medical examination at a health care facility 04/24/2015  . Overweight 03/15/2015  . Memory loss 09/22/2014  . Exercise intolerance 07/30/2014  . Estrogen  deficiency 04/28/2014  . Tachycardia 04/03/2014  . Family history of heart disease 08/27/2013  . Dyspnea on exertion 08/18/2013  . Positional vertigo 08/18/2013  . Encounter for Medicare annual wellness exam 11/23/2011  . Other screening mammogram 11/23/2011  . Insomnia- due to stress 01/16/2011  . Left sided abdominal sub-Q tissue pain of unknown cause/ ( dx as L sided pelvic pain in past) 12/13/2009  . Fatigue 08/15/2009  . Osteopenia 11/26/2008  . WEIGHT GAIN 01/15/2008  . Hypothyroidism 12/12/2007  . Hyperlipidemia 12/12/2007  . Adjustment disorder with mixed anxiety and depressed mood 12/12/2007  . Chronic GERD 12/12/2007  . ADHESIVE CAPSULITIS 12/12/2007    Past Surgical History:  Procedure Laterality Date  . BREAST LUMPECTOMY Left 2000  . CHOLECYSTECTOMY N/A 02/21/2015   Procedure: LAPAROSCOPIC CHOLECYSTECTOMY WITH INTRAOPERATIVE CHOLANGIOGRAM;  Surgeon: Greer Pickerel, MD;  Location: [facility];  Service: General;  Laterality: N/A;  . COLONOSCOPY    . MULTIPLE TOOTH EXTRACTIONS       OB History    Gravida  2   Para  2   Term  2   Preterm      AB      Living        SAB      TAB      Ectopic      Multiple      Live Births               Home Medications    Prior to Admission medications   Medication Sig Start Date End Date Taking? Authorizing Provider  amoxicillin-clavulanate (AUGMENTIN) 875-125 MG tablet Take 1 tablet by mouth 2 (two) times daily. 02/01/18   [provider]  atorvastatin (LIPITOR) 20 MG tablet Take 1 tablet (20 mg total) by mouth at bedtime. 09/27/17   Mellody Dance, DO  b complex vitamins tablet Take 1 tablet by mouth daily.    [provider]  calcium carbonate (OS-CAL - DOSED IN MG OF ELEMENTAL CALCIUM) 1250 (500 CA) MG tablet Take 1 tablet by mouth daily.    [provider]  carboxymethylcellulose (REFRESH PLUS) 0.5 % SOLN Place 1 drop into both eyes 3 (three) times daily as needed (for dryness).     [provider]  Cholecalciferol (VITAMIN D3) 2000 UNITS capsule Take 2,000 Units by mouth daily.      [provider]  Cranberry 125 MG TABS Take 2 tablets by mouth.    [provider]  cyanocobalamin ([employer] VITAMIN B12) 2000 MCG tablet Take 1 tablet (2,000 mcg total) by mouth daily. 02/04/18   Opalski, Deborah, DO  donepezil (ARICEPT) 10 MG tablet Take 1 tablet (10 mg total) by mouth at bedtime. Patient not taking: Reported on 02/04/2018 07/02/17   Marcial Pacas, MD  escitalopram (LEXAPRO) 20 MG tablet One half tab daily for 4 wks then one tab by mouth daily 02/05/18   Mellody Dance, DO  fluticasone (FLONASE) 50 MCG/ACT nasal spray 1 spray 2 times daily after sinus rinses- Milta Deiters med or AYR 12/20/17   Mellody Dance, DO  levothyroxine (SYNTHROID, LEVOTHROID) 75 MCG tablet Take 1 tablet (75 mcg total) by mouth daily. 11/28/17   Opalski, Neoma Laming, DO  liothyronine (CYTOMEL) 5 MCG tablet Take 1 tablet (5 mcg total) by mouth daily. 12/02/17   Mellody Dance, DO  Multiple Vitamin (MULTIVITAMIN) tablet Take 1 tablet by mouth daily.      [provider]  Omega-3 Fatty Acids (FISH OIL PO) Take 2 capsules by mouth 2 (two) times daily.    [provider]  ramelteon (ROZEREM) 8 MG tablet Take 1 tablet (8 mg total) by mouth at bedtime. 02/04/18   Mellody Dance, DO  traZODone (DESYREL) 50 MG tablet Take 1 tablet (50 mg total) by mouth at bedtime as needed for sleep. 02/17/18   Opalski, Neoma Laming, DO  vitamin C (ASCORBIC ACID) 500 MG tablet Take 500 mg by mouth daily.    [provider]    Family History Family History  Problem Relation Age of Onset  . Osteoporosis Mother   . Parkinsonism Father   . Cancer Sister        breast CA  . Breast cancer Sister 89  . Cancer Other        breast CA  . Coronary artery disease Sister   . Breast cancer Sister   . Heart disease Maternal Grandmother   . Heart disease Maternal Grandfather     Social History Social  History   Tobacco Use  . Smoking status: Never Smoker  . Smokeless tobacco: Never Used  Substance Use Topics  . Alcohol use: No    Alcohol/week: 0.0 standard drinks  . Drug use: No     Allergies   Ciprofloxacin; Citalopram hydrobromide; Flagyl [metronidazole]; and Oxycodone   Review of Systems Review of Systems  Constitutional: Negative for chills and fever.  HENT: Positive for congestion, sinus pressure and sinus pain. Negative for ear pain, facial swelling and postnasal drip.   Respiratory: Negative for cough, chest tightness and shortness of breath.   Cardiovascular: Negative for chest pain, palpitations and leg swelling.  Gastrointestinal: Positive for abdominal pain, diarrhea, nausea and vomiting.  Genitourinary: Negative for dysuria, flank pain, pelvic pain, vaginal bleeding, vaginal discharge and vaginal pain.  Musculoskeletal: Negative for arthralgias, myalgias, neck pain and neck stiffness.  Skin: Negative for rash.  Neurological: Positive for headaches. Negative for dizziness and weakness.  All other systems reviewed and are negative.    Physical Exam Updated Vital Signs BP 130/87 (BP Location: Right Arm)   Pulse 93   Temp (!) 97.5 F (36.4 C)   Resp 18   SpO2 97%   Physical Exam Vitals signs and nursing note reviewed.  Constitutional:      General: She is not in acute distress.    Appearance: She is well-developed.  HENT:     Head: Normocephalic.     Comments: Sinuses nontender    Right Ear: Tympanic membrane, ear canal and external ear normal.     Left Ear: Tympanic membrane, ear canal and external ear normal.     Nose: No congestion or rhinorrhea.     Mouth/Throat:     Mouth: Mucous membranes are moist.     Pharynx: No oropharyngeal exudate or posterior oropharyngeal erythema.  Eyes:     Conjunctiva/sclera: Conjunctivae normal.  Neck:     Musculoskeletal: Neck supple.  Cardiovascular:  Rate and Rhythm: Normal rate and regular rhythm.      Heart sounds: Normal heart sounds.  Pulmonary:     Effort: Pulmonary effort is normal. No respiratory distress.     Breath sounds: Normal breath sounds. No wheezing or rales.  Abdominal:     General: Bowel sounds are normal. There is no distension.     Palpations: Abdomen is soft.     Tenderness: There is no abdominal tenderness. There is no rebound.  Skin:    General: Skin is warm and dry.  Neurological:     Mental Status: She is alert.  Psychiatric:        Behavior: Behavior normal.      ED Treatments / Results  Labs (all labs ordered are listed, but only abnormal results are displayed) Labs Reviewed  URINALYSIS, ROUTINE W REFLEX MICROSCOPIC - Abnormal; Notable for the following components:      Result Value   Color, Urine AMBER (*)    APPearance CLOUDY (*)    Nitrite POSITIVE (*)    Leukocytes, UA LARGE (*)    WBC, UA >50 (*)    Bacteria, UA RARE (*)    All other components within normal limits  URINE CULTURE    EKG None  Radiology No results found.  Procedures Procedures (including critical care time)  Medications Ordered in ED Medications  ondansetron (ZOFRAN-ODT) disintegrating tablet 8 mg (has no administration in time range)  acetaminophen (TYLENOL) tablet 650 mg (has no administration in time range)     Initial Impression / Assessment and Plan / ED Course  I have reviewed the triage vital signs and the nursing notes.  Pertinent labs & imaging results that were available during my care of the patient were reviewed by me and considered in my medical decision making (see chart for details).     1:42 PM Patient in emergency department with nausea, vomiting, diarrhea, headache, sinus pressure.  On exam, patient is nontoxic-appearing.  She is afebrile, normal vital signs.  No URI symptoms other than chronic sinus infection.  She is complaining of nausea and had several episodes of vomiting on the way here.  I will give her Zofran and Tylenol for her  headache.  Will check urine analysis.  Very unlikely influenza in setting of no fever, however still possible, she is out of the window for Tamiflu at this point.  3:15 PM Urine analysis shows greater than 50,000 white blood cells, large leukocytes and positive nitrites, consistent with urinary tract infection.  Patient is able to tolerate water in the department as well as graham crackers.  Her vital signs are normal.  She does not appear to be septic, does not meet Sirs criteria.  I will start her on antibiotic for her UTI.  I will give prescription for Zofran for nausea and vomiting.  Advised to drink plenty of fluids, rest.  Patient states she is actually hungry would like to go home and eat.  We discussed close outpatient follow-up with her doctor in 3 days or come back to the hospital if her symptoms are worsening for further work-up with blood work and possible admission.  Patient agreed.   Vitals:   02/23/18 1323 02/23/18 1325  BP:  130/87  Pulse:  93  Resp:  18  Temp: (!) 97.5 F (36.4 C)   SpO2:  97%     Final Clinical Impressions(s) / ED Diagnoses   Final diagnoses:  Acute cystitis without hematuria  Non-intractable vomiting with nausea,  unspecified vomiting type    ED Discharge Orders         Ordered    cephALEXin (KEFLEX) 500 MG capsule  2 times daily     02/23/18 1515    ondansetron (ZOFRAN ODT) 8 MG disintegrating tablet  Every 8 hours PRN     02/23/18 1515    fluconazole (DIFLUCAN) 150 MG tablet  Daily     02/23/18 1517           Jeannett Senior, PA-C 02/23/18 1518    Duffy Bruce, MD 02/24/18 [phone]

## 2018-02-23 NOTE — Discharge Instructions (Addendum)
Start taking Keflex, take as prescribed until all gone.  Take ibuprofen or Tylenol for pain.  Take Zofran as prescribed as needed for nausea and vomiting.  Rest.  Make sure to drink plenty of fluids even if you do not feel like eating.  Follow-up with your family doctor for recheck in 3 days or come back if symptoms are worsening.

## 2018-02-23 NOTE — ED Triage Notes (Signed)
Pt is alert and oriented x 4 and verbally responsive.Pt states she has nasal congestion, with nose pain, vomiting, nausea, and diarrhea. Pt states that she had a sinus infection last month and has quite gotten better.

## 2018-02-24 ENCOUNTER — Encounter: Payer: Self-pay | Admitting: Family Medicine

## 2018-02-24 ENCOUNTER — Telehealth: Payer: Self-pay | Admitting: Family Medicine

## 2018-02-24 ENCOUNTER — Ambulatory Visit (INDEPENDENT_AMBULATORY_CARE_PROVIDER_SITE_OTHER): Payer: Medicare HMO | Admitting: Family Medicine

## 2018-02-24 VITALS — BP 127/86 | HR 96 | Temp 98.4°F | Ht 63.0 in | Wt 166.0 lb

## 2018-02-24 DIAGNOSIS — Z09 Encounter for follow-up examination after completed treatment for conditions other than malignant neoplasm: Secondary | ICD-10-CM

## 2018-02-24 DIAGNOSIS — R110 Nausea: Secondary | ICD-10-CM

## 2018-02-24 DIAGNOSIS — J069 Acute upper respiratory infection, unspecified: Secondary | ICD-10-CM | POA: Diagnosis not present

## 2018-02-24 DIAGNOSIS — R519 Headache, unspecified: Secondary | ICD-10-CM

## 2018-02-24 DIAGNOSIS — R51 Headache: Secondary | ICD-10-CM

## 2018-02-24 DIAGNOSIS — N3090 Cystitis, unspecified without hematuria: Secondary | ICD-10-CM | POA: Diagnosis not present

## 2018-02-24 MED ORDER — IBUPROFEN 600 MG PO TABS: 600.0000 mg | ORAL_TABLET | Freq: Three times a day (TID) | ORAL | 0 refills | Status: DC | PRN

## 2018-02-24 MED ORDER — RIZATRIPTAN BENZOATE 10 MG PO TABS: 10.0000 mg | ORAL_TABLET | ORAL | 0 refills | Status: DC | PRN

## 2018-02-24 NOTE — Telephone Encounter (Signed)
Patient seen in the office today 02/24/2018. MPulliam, CMA/RT(R)

## 2018-02-24 NOTE — Patient Instructions (Signed)
  Please avoid all fried foods, fatty foods, spicy foods and especially all dairy until your stomach feels back to normal for at least 2 to 3 days, then gradually return these foods back into your diet     Migraine Headache  A migraine headache is a very strong throbbing pain on one side or both sides of your head. Migraines can also cause other symptoms. Talk with your doctor about what things may bring on (trigger) your migraine headaches. Follow these instructions at home: Medicines  Take over-the-counter and prescription medicines only as told by your doctor.  Do not drive or use heavy machinery while taking prescription pain medicine.  To prevent or treat constipation while you are taking prescription pain medicine, your doctor may recommend that you: ? Drink enough fluid to keep your pee (urine) clear or pale yellow. ? Take over-the-counter or prescription medicines. ? Eat foods that are high in fiber. These include fresh fruits and vegetables, whole grains, and beans. ? Limit foods that are high in fat and processed sugars. These include fried and sweet foods. Lifestyle  Avoid alcohol.  Do not use any products that contain nicotine or tobacco, such as cigarettes and e-cigarettes. If you need help quitting, ask your doctor.  Get at least 8 hours of sleep every night.  Limit your stress. General instructions   Keep a journal to find out what may bring on your migraines. For example, write down: ? What you eat and drink. ? How much sleep you get. ? Any change in what you eat or drink. ? Any change in your medicines.  If you have a migraine: ? Avoid things that make your symptoms worse, such as bright lights. ? It may help to lie down in a dark, quiet room. ? Do not drive or use heavy machinery. ? Ask your doctor what activities are safe for you.  Keep all follow-up visits as told by your doctor. This is important. Contact a doctor if:  You get a migraine that is  different or worse than your usual migraines. Get help right away if:  Your migraine gets very bad.  You have a fever.  You have a stiff neck.  You have trouble seeing.  Your muscles feel weak or like you cannot control them.  You start to lose your balance a lot.  You start to have trouble walking.  You pass out (faint). This information is not intended to replace advice given to you by your health care provider. Make sure you discuss any questions you have with your health care provider. Document Released: 11/29/2007 Document Revised: 11/13/2017 Document Reviewed: 08/08/2015 Elsevier Interactive Patient Education  2019 [employer].

## 2018-02-24 NOTE — Telephone Encounter (Signed)
Patient called states has 2 issues :  1st) has had a headache for 3 dys & request a call from nurse/ medical staff on suggestion on how to get rid of.  2nd)-- Pt says over weekend developed a bladder infection & went to an Urgent Care for treatment she was gvn Rx & advised to see provider in 3dys but that's 12/25 Christmas, advised pt  Dr.O's ,Thurs schedule open is full @ this time, placed her on [place] scheduled for Thurs 12/26.  --Pt still wants someone to call her w/ advice regarding Headache.  --glh

## 2018-02-24 NOTE — Progress Notes (Signed)
Pt here for an acute care OV today  Impression and Recommendations:    1. Encounter for follow-up after hospital ER visit.   2. Acute URI   3. Cystitis/  UTI just dx yesterday   4. Headache above the eye region- no neuro sx   5. Nausea     -Regarding pt's recent hospitalization and/or ED visit:  reviewed in great detail recent hospitalization notes, clinical lab tests, tests in the radiology section of CPT, tests in the medicine testing of CPT, and obtained history from family members- pt and husband.  Explained all findings to pt and family.  Reviewed everything with pt/fam, all tests, txmnts rendered etc. - All Q's answered today in office   1. Acute Headache - Currently On Treatment for Acute UTI  - Patient was prescribed Keflex and ondansetron/Zofran yesterday in the ER.   - Continue treatment as prescribed for URI and UTI (Keflex, Zofran).  Push fluids and monitor symptoms.  - Discussed use of Zofran with patient today.  Reviewed that anti-nausea medication may cause her to feel sleepy.  - Per patient, has also been taking a half-tablet of trazodone, which she reported in the past had a side-effect of headache.  Advised patient to discontinue use of trazodone if she suspects this could be contributing to her headache.  - Advised that patient begin use of rozerem/ramelteon as a sleep aid in place of trazodone PRN.  - Headache medication provided today.  Patient feels her headache is rated at least an 8 out of 10, but NOT worst of her life  - Patient had ibuprofen 4 hours prior to appt so we are not able to give her Ketoralac in office today.  This is the only acute pain type med we have available to pt today  - Advised the patient to continue using AYR or Neilmed sinus rinses BID followed by flonase BID (one spray to each nostril).   Advised that the patient may also incorporate allegra or claritin PRN.  She can f/up with her ENT doc for chronic rhinosinusitis sx  - Given  patient's ongoing nausea, advised to use the zofran given to her by ER ( which pt has NOT used yet) ;   - R/w pt dietary restrictions; no milk, no dairy, no fried foods, no spicy foods, no fatty foods- ie.no peanut butter.    - Advised patient to eat plain bread, plain crackers, plain mashed potatoes, plain dried cereal, plain rice etc- BRAT diet d/c pt and husband. - reassurred no red flag sx/ signs today and due to her age and other MMP- esp what I know of pt, esp lately- this is all likely related to her acute UTI.  - HA w Nausea:   Patient knows to keep a close eye on herself.  If new neurological symptoms emerge- TO ED urgently. Aware that if  despite the patient laying down, resting, eating plain foods, and truly practicing supportive care for 2-3 days, if NI or W->  patient knows she needs to go to the emergency room and obtain a CT scan of the head.  - Extensively reviewed red flag symptoms with patient today.  Discussed that if headache worsens, feelings of unsteadiness worsen, or other red flag symptoms worsen, dial 911 or ED.  - Otherwise, advised patient to follow up with [facility] as recommended.   Meds ordered this encounter  Medications  . rizatriptan (MAXALT) 10 MG tablet    Sig: Take  1 tablet (10 mg total) by mouth as needed for migraine. May repeat in 2 hours if needed    Dispense:  10 tablet    Refill:  0  . ibuprofen (ADVIL,MOTRIN) 600 MG tablet    Sig: Take 1 tablet (600 mg total) by mouth every 8 (eight) hours as needed (severer pain/ headache).    Dispense:  10 tablet    Refill:  0    Education and routine counseling performed. Handouts provided  Gross side effects, risk and benefits, and alternatives of medications and treatment plan in general discussed with patient.  Patient is aware that all medications have potential side effects and we are unable to predict every side effect or drug-drug interaction that may occur.   Patient will call with any questions  prior to using medication if they have concerns.    Expresses verbal understanding and consents to current therapy and treatment regimen.  No barriers to understanding were identified.  Red flag symptoms and signs discussed in detail.  Patient expressed understanding regarding what to do in case of emergency\urgent symptoms   Please see AVS handed out to patient at the end of our visit for further patient instructions/ counseling done pertaining to today's office visit.   Return if symptoms worsen or fail to improve.     Note:  This document was prepared occasionally using Dragon voice recognition software and may include unintentional dictation errors in addition to a scribe.  This document serves as a record of services personally performed by Mellody Dance, DO. It was created on her behalf by Toni Amend, a trained medical scribe. The creation of this record is based on the scribe's personal observations and the provider's statements to them.   I have reviewed the above medical documentation for accuracy and completeness and I concur.  Mellody Dance, DO 03/02/2018 5:47 PM       ----------------------------------------------------------------------------------------------------------------------------------------   Subjective:    CC:  Chief Complaint  Patient presents with  . Headache    HPI: Frances Hoffman is a 77 y.o. female who presents to [place] at [facility] today for issues as discussed below.  States "I don't feel good."  Had diarrhea, nausea, and vomiting, yesterday.  Seen in ER for all her sx - dx w UTI.    Pt not sure what she was dx with and what studies showed.  She has A LOT of Q's today.   On Treatment for Acute Cystitis Patient was diagnosed with a urinary tract infection yesterday in ER, and started on Keflex and ondansetron for nausea yesterday.  After her visit to the ER, she took one dose of antibiotics yesterday, and  another this morning.  Husband notes that patient had an infection in her blood in the past from a UTI, and had to receive IV antibiotics.  He and pt have many concerns today about this possibility and a lot Q's about ER tests.   Acute Headache Complains of: Headache, starting three days ago, on Saturday.  Location of Headache:  Bitemporal and bifrontal.  Husband notes that her headache began "across her bridge" and "spread to her temples."  She has had sinus HA's similar in past, but this is "a bad one."  Sx: Patient notes "h/o in past-  had a migraine."   This is NOT the W HA of her life.  Says "I feel very unstable.  If I have to do anything, move much, I get dizzy."  States she  has no appetite.  "I feel like if I eat anything, I could throw up."  Cause of Onset: Her headache began out of the blue, not after she did any activities.  But states she has had URI sx as well lately.     Says "I think I've had maybe one migraine in my whole life."  Husband says "she's never complained of a headache before like this."  She isn't sure if it's the worst headache of her life, but thinks this is "one" of the worst, because she apparently can't get rid of it.  Confirms that the unsteadiness came on with the headache.  She also experienced nausea and vomiting yesterday, with her bladder infection.  She ate a little bit of cereal this morning, "about three or four bites"- kept it down.   Had graham crackers and milk yesterday- no problems.  Often has issues with her sinuses/ sinus HA's.  Has ENT doc.  Has tried:  Has tried tylenol and ibuprofen with no improvement- only 1 tylenol and 2 OTC ibuprofen.  Patient has been taking trazodone even though she claims is gives her HA's- she is wondering if this can be related.  She states "because I didn't think I could take the other."     Notes she tried to pick something up, but it "contained iron," and "if I take iron, it gives me diarrhea."  Patient doesn't  remember the name "rozerem" on her med list. Doesn't think she has taken it at all as alternative to trazdone  -  Patient's husband states that Tylenol PM helped the patient to sleep and alleviate her headache.  Denials:  Has been feeling weak and tired, dizzy with the UTI she been confirmed in having yest in ED.   Patient's husband denies times where she couldn't speak properly, denies inability to move one hand or one half of the body, denies facial droop etc.  Both patient and husband deny signs of stroke.    Wt Readings from Last 3 Encounters:  02/24/18 166 lb (75.3 kg)  02/04/18 168 lb 8 oz (76.4 kg)  01/16/18 172 lb (78 kg)   BP Readings from Last 3 Encounters:  02/24/18 127/86  02/23/18 130/87  02/04/18 105/73   BMI Readings from Last 3 Encounters:  02/24/18 29.41 kg/m  02/04/18 29.85 kg/m  01/16/18 30.47 kg/m     Patient Care Team    Relationship Specialty Notifications Start End  Mellody Dance, DO PCP - General Family Medicine  08/24/15   Barrett, Evelene Croon, PA-C Physician Assistant Cardiology  01/08/16    Comment: 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  Megan Salon, [place] Physician Gynecology  11/14/16      Patient Active Problem List   Diagnosis Date Noted  . Breast cancer ([place])- L breast 2000 11/14/2016    Priority: High  . Hyperlipidemia 12/12/2007    Priority: High  . Adjustment disorder with mixed anxiety and depressed mood 12/12/2007    Priority: High  . Depression 12/23/2015    Priority: Medium  . Overweight 03/15/2015    Priority: Medium  . Insomnia- due to stress 01/16/2011    Priority: Medium  . Hypothyroidism 12/12/2007    Priority: Medium  . Chronic GERD 12/12/2007    Priority: Medium  . Leg cramping 02/10/2016    Priority: Low  . Asx Hypotension 09/25/2015    Priority: Low  . Fatigue 08/15/2009    Priority: Low  . Osteopenia 11/26/2008  Priority: Low  . B12 deficiency 02/04/2018  . Mood disorder ([place]) 02/04/2018  .  Xerostomia 01/23/2018  . Dysgeusia 01/23/2018  . Tongue burning sensation 01/16/2018  . Weakness 12/31/2017  . Hypoxia 12/31/2017  . Ill feeling 12/31/2017  . Persistent dry cough 12/20/2017  . Post-nasal drainage 12/20/2017  . Medication monitoring encounter 12/20/2017  . Chronic sinusitis 12/20/2017  . Chronic kidney disease (CKD) stage G3a/A1, moderately decreased glomerular filtration rate (GFR) between 45-59 mL/min/1.73 square meter and albuminuria creatinine ratio less than 30 mg/g (HCC) 11/28/2017  . Mild dehydration 11/28/2017  . Cough -  2-3 mo  11/28/2017  . Environmental and seasonal allergies 11/28/2017  . Hearing loss and HA due to cerumen impaction, bilateral 10/10/2017  . Nonintractable headache 10/10/2017  . Vascular dementia without behavioral disturbance ([place]) 09/27/2017  . Memory difficulties 09/27/2017  . Prediabetes 09/27/2017  . Mild cognitive impairment 07/02/2017  . Cerebral vascular disease 07/02/2017  . Chronic insomnia 07/02/2017  . Primary osteoarthritis of first carpometacarpal joint of left hand 01/09/2017  . Obesity, Class I, BMI 30-34.9 08/19/2016  . 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 Chest pain 10/26/2015  . Fecal incontinence 07/29/2015  . Heme positive stool 07/29/2015  . Leg pain 06/06/2015  . Routine general medical examination at a health care facility 04/24/2015  . Memory loss 09/22/2014  . Exercise intolerance 07/30/2014  . Estrogen deficiency 04/28/2014  . Tachycardia 04/03/2014  . Family history of heart disease 08/27/2013  . Dyspnea on exertion 08/18/2013  . Positional vertigo 08/18/2013  . Encounter for Medicare annual wellness exam 11/23/2011  . Other screening mammogram 11/23/2011  . Left sided abdominal sub-Q tissue pain of unknown cause/ ( dx as L sided pelvic pain in past) 12/13/2009  . WEIGHT GAIN 01/15/2008  . ADHESIVE CAPSULITIS 12/12/2007      Past Medical History:  Diagnosis Date  . Adjustment disorder with  mixed anxiety and depressed mood 12/12/2007   Qualifier: Diagnosis of  By: Copland MD, Frederico Hamman    . Cancer ([place]) 2000   breast had lumpectomy/radiation x36,mammo ,no chemo  . Cataract of both eyes   . Depression   . Dysuria-frequency syndrome    takes AZO  . Frozen shoulder   . Gall stones   . GERD 12/12/2007   Qualifier: Diagnosis of  By: Copland MD, Frederico Hamman    . Hyperlipidemia   . Hypothyroidism 12/12/2007   Qualifier: Diagnosis of  By: Copland MD, Frederico Hamman    . Memory loss   . Osteopenia    BMD 2004, WNL 2008  . Personal history of radiation therapy   . Pneumonia   . Recurrent UTI   . Shingles    chronic body pain, left side of body  . Shortness of breath dyspnea    when climbing stairs only  . Wears glasses      Past Surgical History:  Procedure Laterality Date  . BREAST LUMPECTOMY Left 2000  . CHOLECYSTECTOMY N/A 02/21/2015   Procedure: LAPAROSCOPIC CHOLECYSTECTOMY WITH INTRAOPERATIVE CHOLANGIOGRAM;  Surgeon: Greer Pickerel, MD;  Location: [facility];  Service: General;  Laterality: N/A;  . COLONOSCOPY    . MULTIPLE TOOTH EXTRACTIONS       Family History  Problem Relation Age of Onset  . Osteoporosis Mother   . Parkinsonism Father   . Cancer Sister        breast CA  . Breast cancer Sister 91  . Cancer Other        breast CA  . Coronary artery  disease Sister   . Breast cancer Sister   . Heart disease Maternal Grandmother   . Heart disease Maternal Grandfather      Social History   Socioeconomic History  . Marital status: Married    Spouse name: Not on file  . Number of children: 2  . Years of education: 56  . Highest education level: High school graduate  Occupational History  . Not on file  Social Needs  . Financial resource strain: Not on file  . Food insecurity:    Worry: Not on file    Inability: Not on file  . Transportation needs:    Medical: Not on file    Non-medical: Not on file  Tobacco Use  . Smoking status: Never Smoker  . Smokeless tobacco:  Never Used  Substance and Sexual Activity  . Alcohol use: No    Alcohol/week: 0.0 standard drinks  . Drug use: No  . Sexual activity: Never    Birth control/protection: Post-menopausal  Lifestyle  . Physical activity:    Days per week: Not on file    Minutes per session: Not on file  . Stress: Not on file  Relationships  . Social connections:    Talks on phone: Not on file    Gets together: Not on file    Attends religious service: Not on file    Active member of club or organization: Not on file    Attends meetings of clubs or organizations: Not on file    Relationship status: Not on file  . Intimate partner violence:    Fear of current or ex partner: Not on file    Emotionally abused: Not on file    Physically abused: Not on file    Forced sexual activity: Not on file  Other Topics Concern  . Not on file  Social History Narrative   Lives at home with her husband.   Right-handed.   [employer] most days.     Current Meds  Medication Sig  . amoxicillin-clavulanate (AUGMENTIN) 875-125 MG tablet Take 1 tablet by mouth 2 (two) times daily.  [name] atorvastatin (LIPITOR) 20 MG tablet Take 1 tablet (20 mg total) by mouth at bedtime.  [name] b complex vitamins tablet Take 1 tablet by mouth daily.  . calcium carbonate (OS-CAL - DOSED IN MG OF ELEMENTAL CALCIUM) 1250 (500 CA) MG tablet Take 1 tablet by mouth daily.  . carboxymethylcellulose (REFRESH PLUS) 0.5 % SOLN Place 1 drop into both eyes 3 (three) times daily as needed (for dryness).  . cephALEXin (KEFLEX) 500 MG capsule Take 1 capsule (500 mg total) by mouth 2 (two) times daily.  . Cholecalciferol (VITAMIN D3) 2000 UNITS capsule Take 2,000 Units by mouth daily.    . Cranberry 125 MG TABS Take 2 tablets by mouth.  . cyanocobalamin ([employer] VITAMIN B12) 2000 MCG tablet Take 1 tablet (2,000 mcg total) by mouth daily.  [name] donepezil (ARICEPT) 10 MG tablet Take 1 tablet (10 mg total) by mouth at bedtime.  [name] escitalopram (LEXAPRO) 20 MG tablet  One half tab daily for 4 wks then one tab by mouth daily  . fluconazole (DIFLUCAN) 150 MG tablet Take 1 tablet (150 mg total) by mouth daily.  . fluticasone (FLONASE) 50 MCG/ACT nasal spray 1 spray 2 times daily after sinus rinses- Milta Deiters med or AYR  . levothyroxine (SYNTHROID, LEVOTHROID) 75 MCG tablet Take 1 tablet (75 mcg total) by mouth daily.  [name] liothyronine (CYTOMEL) 5 MCG tablet Take 1 tablet (  5 mcg total) by mouth daily.  . Multiple Vitamin (MULTIVITAMIN) tablet Take 1 tablet by mouth daily.    . Omega-3 Fatty Acids (FISH OIL PO) Take 2 capsules by mouth 2 (two) times daily.  . ondansetron (ZOFRAN ODT) 8 MG disintegrating tablet Take 1 tablet (8 mg total) by mouth every 8 (eight) hours as needed for nausea or vomiting.  . ramelteon (ROZEREM) 8 MG tablet Take 1 tablet (8 mg total) by mouth at bedtime.  . traZODone (DESYREL) 50 MG tablet Take 1 tablet (50 mg total) by mouth at bedtime as needed for sleep.  . vitamin C (ASCORBIC ACID) 500 MG tablet Take 500 mg by mouth daily.    Allergies:  Allergies  Allergen Reactions  . Ciprofloxacin Other (See Comments)    Unknown (vertigo??)  . Citalopram Hydrobromide Other (See Comments)    Intrusive/ odd thoughts   . Flagyl [Metronidazole] Other (See Comments)    Unknown??  . Oxycodone Other (See Comments)    Headaches     Review of Systems: General:   Denies fever, chills, unexplained weight loss.  Optho/Auditory:   Denies visual changes, blurred vision/LOV Respiratory:   Denies wheeze, DOE more than baseline levels.   Cardiovascular:   Denies chest pain, palpitations, new onset peripheral edema  Gastrointestinal:   Denies nausea, vomiting, diarrhea, abd pain.  Genitourinary: Denies dysuria, freq/ urgency, flank pain or discharge from genitals.  Endocrine:     Denies hot or cold intolerance, polyuria, polydipsia. Musculoskeletal:   Denies unexplained myalgias, joint swelling, unexplained arthralgias, gait problems.  Skin:  Denies new  onset rash, suspicious lesions Neurological:     Denies dizziness, unexplained weakness, numbness  Psychiatric/Behavioral:   Denies mood changes, suicidal or homicidal ideations, hallucinations    Objective:   Blood pressure 127/86, pulse 96, temperature 98.4 F (36.9 C), height 5\' 3"  (1.6 m), weight 166 lb (75.3 kg), SpO2 98 %. Body mass index is 29.41 kg/m. General:  Well Developed, well nourished, appropriate for stated age.  Neuro:  Alert and oriented,  extra-ocular muscles intact  CN II-XII GI. No cerebellar signs, no pronator drift.  HEENT:  Normocephalic, atraumatic, neck supple, TM's- WNL's, No LAD, OP- clear, mild erythema,  + TTP frontal, ethmoid sinuses, no nuchal rigidity, ROM c-spine N Skin:  no gross rash, warm, pink. Cardiac:  RRR, S1 S2 Respiratory:  ECTA B/L and A/P, Not using accessory muscles, speaking in full sentences- unlabored. Vascular:  Ext warm, no cyanosis apprec.; cap RF less 2 sec. Psych:  No HI/SI, judgement and insight good, Euthymic mood. Full Affect.

## 2018-02-26 LAB — URINE CULTURE: Culture: >=100000 — AB

## 2018-02-27 ENCOUNTER — Ambulatory Visit: Payer: Medicare HMO | Admitting: Adult Health

## 2018-02-27 ENCOUNTER — Telehealth: Payer: Self-pay | Admitting: Emergency Medicine

## 2018-02-27 ENCOUNTER — Encounter: Payer: Self-pay | Admitting: Family Medicine

## 2018-02-27 ENCOUNTER — Telehealth: Payer: Self-pay | Admitting: Family Medicine

## 2018-02-27 NOTE — Telephone Encounter (Signed)
Please review and advise.  LOV 02/24/2018. MPulliam, CMA/RT(R)

## 2018-02-27 NOTE — Telephone Encounter (Signed)
Patient called stating that she got a prescription for fluconazole when she was in the hospital, but on the back of the package states it can cause headaches. Since she has been dealing with headaches/migranes should she take this med?  Please advise

## 2018-02-27 NOTE — Progress Notes (Signed)
ED Antimicrobial Stewardship Positive Culture Follow Up   BRUCE MAYERS is an 77 y.o. female who presented to [facility] on 02/23/2018 with a chief complaint of Nausea, vomiting, diarrhea, HA and chronic sinus infection Chief Complaint  Patient presents with  . flu like symptoms    Recent Results (from the past 720 hour(s))  Urine culture     Status: Abnormal   Collection Time: 02/23/18  2:02 PM  Result Value Ref Range Status   Specimen Description   Final    URINE, CLEAN CATCH Performed at [facility] [address], [place], [place] [address]    Special Requests   Final    NONE Performed at [facility], [place] [address] [place], [place] [address]    Culture >=100,000 COLONIES/mL ESCHERICHIA COLI (A)  Final   Report Status 02/26/2018 FINAL  Final   Organism ID, Bacteria ESCHERICHIA COLI (A)  Final      Susceptibility   Escherichia coli - MIC*    AMPICILLIN >=32 RESISTANT Resistant     CEFAZOLIN >=64 RESISTANT Resistant     CEFTRIAXONE <=1 SENSITIVE Sensitive     CIPROFLOXACIN <=0.25 SENSITIVE Sensitive     GENTAMICIN <=1 SENSITIVE Sensitive     IMIPENEM <=0.25 SENSITIVE Sensitive     NITROFURANTOIN <=16 SENSITIVE Sensitive     TRIMETH/SULFA <=20 SENSITIVE Sensitive     AMPICILLIN/SULBACTAM 16 INTERMEDIATE Intermediate     PIP/TAZO >=128 RESISTANT Resistant     Extended ESBL NEGATIVE Sensitive     * >=100,000 COLONIES/mL ESCHERICHIA COLI    [x]  Treated with Keflex, organism resistant to prescribed antimicrobial  New antibiotic prescription: Macrobid 100mg  BID x 5d  ED Provider: Janeece Fitting, Reece Leader 02/27/2018, 1:38 PM Clinical Pharmacist Monday - Friday phone -  [phone] Saturday - Sunday phone - [phone]

## 2018-02-27 NOTE — Telephone Encounter (Signed)
The prescription was for 1 tablet x 1.  This was just to prevent yeast infection.  Thus, if patient is having no symptoms, I do not recommend she take the second tablet.  However please let patient know that it is not common for that to cause headaches, but of course like any medicine, any side effect is theoretically possible

## 2018-02-27 NOTE — Telephone Encounter (Signed)
Post ED Visit - Positive Culture Follow-up: Successful Patient Follow-Up  Culture assessed and recommendations reviewed by:  []  Elenor Quinones, Pharm.D. []  Heide Guile, Pharm.D., BCPS AQ-ID []  Parks Neptune, Pharm.D., BCPS []  Alycia Rossetti, Pharm.D., BCPS []  [place], [place].D., BCPS, AAHIVP []  Legrand Como, Pharm.D., BCPS, AAHIVP []  Salome Arnt, PharmD, BCPS []  Johnnette Gourd, PharmD, BCPS []  Hughes Better, PharmD, BCPS [x]  Bertis Ruddy, PharmD  Positive Urine culture  []  Patient discharged without antimicrobial prescription and treatment is now indicated [x]  Organism is resistant to prescribed ED discharge antimicrobial []  Patient with positive blood cultures  Changes discussed with ED provider: Janeece Fitting PA New antibiotic prescription: Macrobid 100 mg BID x five days Called to [place] patient, date 02/27/2018, time [place] 02/27/2018, 11:46 AM

## 2018-02-27 NOTE — Telephone Encounter (Signed)
Patient notified. MPulliam, CMA/RT(R)  

## 2018-03-11 ENCOUNTER — Other Ambulatory Visit: Payer: Self-pay | Admitting: Family Medicine

## 2018-03-11 DIAGNOSIS — Z1231 Encounter for screening mammogram for malignant neoplasm of breast: Secondary | ICD-10-CM

## 2018-03-19 ENCOUNTER — Ambulatory Visit: Payer: Medicare HMO | Admitting: Family Medicine

## 2018-03-26 ENCOUNTER — Encounter: Payer: Self-pay | Admitting: Family Medicine

## 2018-03-26 LAB — HM COLONOSCOPY

## 2018-04-01 ENCOUNTER — Other Ambulatory Visit: Payer: Medicare HMO

## 2018-04-04 ENCOUNTER — Ambulatory Visit: Payer: Medicare HMO | Admitting: Family Medicine

## 2018-04-10 ENCOUNTER — Encounter: Payer: Self-pay | Admitting: Family Medicine

## 2018-04-10 ENCOUNTER — Ambulatory Visit (INDEPENDENT_AMBULATORY_CARE_PROVIDER_SITE_OTHER): Payer: Medicare HMO | Admitting: Family Medicine

## 2018-04-10 VITALS — BP 84/52 | HR 75 | Temp 98.2°F | Ht 63.0 in | Wt 160.8 lb

## 2018-04-10 DIAGNOSIS — N898 Other specified noninflammatory disorders of vagina: Secondary | ICD-10-CM

## 2018-04-10 DIAGNOSIS — N390 Urinary tract infection, site not specified: Secondary | ICD-10-CM

## 2018-04-10 DIAGNOSIS — B372 Candidiasis of skin and nail: Secondary | ICD-10-CM | POA: Diagnosis not present

## 2018-04-10 DIAGNOSIS — R432 Parageusia: Secondary | ICD-10-CM

## 2018-04-10 LAB — POCT URINALYSIS DIPSTICK
BILIRUBIN UA: NEGATIVE
Blood, UA: POSITIVE
Glucose, UA: NEGATIVE
Ketones, UA: NEGATIVE
Protein, UA: POSITIVE — AB
Spec Grav, UA: 1.015 (ref 1.010–1.025)
Urobilinogen, UA: 0.2 E.U./dL
pH, UA: 6 (ref 5.0–8.0)

## 2018-04-10 MED ORDER — NYSTATIN 100000 UNIT/GM EX OINT: 1.0000 "application " | TOPICAL_OINTMENT | Freq: Two times a day (BID) | CUTANEOUS | 0 refills | Status: DC

## 2018-04-10 NOTE — Progress Notes (Signed)
Subjective:     Frances Hoffman is a 78 y.o. female presenting for Establish Care (previous PCP was Mellody Dance.); Tongue problem (tongue is irritated, taste buds have decreased. Lost some weight due to food not tasting like it should. Patient was evaluated by Dr. Polly Cobia and Thomas Hoff it is due to her b12 level and to take this supplement. This has not helped.); and Vaginal Itching (noticed in December. She was also treated for UTI in December.)     HPI  #Tongue irritated - previously seen 01/23/2018 by ENT - b12 was normal - started on Evoxac which was too expensive - $200 so she did not start this medication - also stopped brushing her tongue - tongue still feeling irritated - taste is different - weight is down - Has tried allergy medication for rhinorrhea w/o impact on taste  #Vaginal itching - UTI in December - treated with Keflex -    Review of Systems  Constitutional: Negative for chills, diaphoresis, fatigue and fever.  HENT: Positive for rhinorrhea.   Eyes: Negative.   Respiratory: Negative for choking, chest tightness and shortness of breath.   Cardiovascular: Negative for chest pain and palpitations.  Gastrointestinal: Negative for abdominal pain, constipation, diarrhea, nausea and vomiting.  Endocrine: Negative for cold intolerance and heat intolerance.  Genitourinary: Negative for difficulty urinating, dysuria, frequency, vaginal bleeding, vaginal discharge and vaginal pain.       Vaginal itching  Musculoskeletal: Negative for arthralgias and myalgias.  Skin: Negative for rash.  Allergic/Immunologic: Negative for immunocompromised state.  Neurological: Negative for dizziness, weakness, numbness and headaches.  Hematological: Does not bruise/bleed easily.  Psychiatric/Behavioral: Negative for dysphoric mood. The patient is not nervous/anxious.     01/23/2018: ENT - dysgeusia - ?dry mouth or B12 deficiency. Told to stop brushing tongue and prescribed Evoxac  to help with salivation  B12 by PCP was normal    Social History   Tobacco Use  Smoking Status Never Smoker  Smokeless Tobacco Never Used        Objective:    BP Readings from Last 3 Encounters:  04/10/18 (!) 84/52  02/24/18 127/86  02/23/18 130/87   Wt Readings from Last 3 Encounters:  04/10/18 160 lb 12 oz (72.9 kg)  02/24/18 166 lb (75.3 kg)  02/04/18 168 lb 8 oz (76.4 kg)    BP (!) 84/52   Pulse 75   Temp 98.2 F (36.8 C)   Ht 5\' 3"  (1.6 m)   Wt 160 lb 12 oz (72.9 kg)   SpO2 95%   BMI 28.48 kg/m   Repeat BP: 95/68  Physical Exam Exam conducted with a chaperone present.  Constitutional:      General: She is not in acute distress.    Appearance: She is well-developed. She is not diaphoretic.  HENT:     Right Ear: External ear normal.     Left Ear: External ear normal.     Nose: Nose normal.     Mouth/Throat:     Lips: Pink.     Mouth: Mucous membranes are dry.     Tongue: Tongue does not protrude in midline.     Comments: Tongue with large central fissure Eyes:     Conjunctiva/sclera: Conjunctivae normal.  Neck:     Musculoskeletal: Neck supple.  Cardiovascular:     Rate and Rhythm: Normal rate.  Pulmonary:     Effort: Pulmonary effort is normal.  Genitourinary:    Pubic Area: Rash (erythematous rash in the leg  creases) present.     Labia:        Right: No rash.        Left: No rash.      Comments: External opening to the vaginal with rash similar to the inner thigh rash - white skin with erythematous rash.  Skin:    General: Skin is warm and dry.     Capillary Refill: Capillary refill takes less than 2 seconds.  Neurological:     Mental Status: She is alert. Mental status is at baseline.  Psychiatric:        Mood and Affect: Mood normal.        Behavior: Behavior normal.      UA: +nitrites, +blood     Assessment & Plan:   Problem List Items Addressed This Visit      Other   Dysgeusia - Primary    Seen by ENT but unable to  start treatment due to cost. Weight loss 2/2 to decreased eating. Recommended returning to ENT and dietician referral if still no improvement in symptoms       Other Visit Diagnoses    Skin yeast infection       Relevant Medications   nystatin ointment (MYCOSTATIN)   Vaginal itching       Relevant Orders   POCT urinalysis dipstick   WET PREP BY MOLECULAR PROBE   Recurrent UTI       Relevant Medications   nystatin ointment (MYCOSTATIN)   Other Relevant Orders   Urine Culture     Skin seems consistent with yeast infection given location - will try nystatin. Vaginal itching likely the same but sent wet prep prior to prescribing.   Recurrent UTI - most recent December. Abnormal urine but no symptoms currently. Will send for culture to evaluate for infection.   BP improved on repeat - recommended hydration today. No dizziness.     Return if symptoms worsen or fail to improve.  [name], MD

## 2018-04-10 NOTE — Patient Instructions (Addendum)
#  Tongue concern - I think you should go back to see the ENT you saw previously -- they recommended a 3 month follow-up - Tell them that the B12 was normal  #Vaginal itching - Test to look for infectious causes - Your skin near your thighs was irritated which may be the cause - For your legs I am going to prescribe an ointment to put on the skin - The other test should be back in 1-2 days. If it shows an infection then I will prescribe medication  -- If your itching does not improve in 1-2 weeks, return to clinic

## 2018-04-10 NOTE — Assessment & Plan Note (Signed)
Seen by ENT but unable to start treatment due to cost. Weight loss 2/2 to decreased eating. Recommended returning to ENT and dietician referral if still no improvement in symptoms

## 2018-04-11 ENCOUNTER — Other Ambulatory Visit: Payer: Self-pay | Admitting: Family Medicine

## 2018-04-11 ENCOUNTER — Ambulatory Visit
Admission: RE | Admit: 2018-04-11 | Discharge: 2018-04-11 | Disposition: A | Discharge status: Home / Self Care | Payer: Medicare HMO | Source: Ambulatory Visit | Attending: Family Medicine | Admitting: Family Medicine

## 2018-04-11 DIAGNOSIS — Z1231 Encounter for screening mammogram for malignant neoplasm of breast: Secondary | ICD-10-CM

## 2018-04-11 HISTORY — DX: Malignant neoplasm of unspecified site of unspecified female breast: C50.919

## 2018-04-11 LAB — WET PREP BY MOLECULAR PROBE
Candida species: NOT DETECTED
Gardnerella vaginalis: NOT DETECTED
MICRO NUMBER:: 161208
SPECIMEN QUALITY:: ADEQUATE
Trichomonas vaginosis: NOT DETECTED

## 2018-04-12 LAB — URINE CULTURE
MICRO NUMBER:: 161194
SPECIMEN QUALITY:: ADEQUATE

## 2018-04-14 ENCOUNTER — Other Ambulatory Visit: Payer: Self-pay | Admitting: Family Medicine

## 2018-04-14 ENCOUNTER — Encounter: Payer: Self-pay | Admitting: Family Medicine

## 2018-04-14 DIAGNOSIS — N3000 Acute cystitis without hematuria: Secondary | ICD-10-CM

## 2018-04-14 MED ORDER — SULFAMETHOXAZOLE-TRIMETHOPRIM 800-160 MG PO TABS: 1.0000 | ORAL_TABLET | Freq: Two times a day (BID) | ORAL | 0 refills | Status: AC

## 2018-04-14 NOTE — Progress Notes (Signed)
Urine culture with sign of infection.   Will send in antibiotic

## 2018-04-23 ENCOUNTER — Other Ambulatory Visit: Payer: Self-pay | Admitting: Gastroenterology

## 2018-04-23 DIAGNOSIS — R634 Abnormal weight loss: Secondary | ICD-10-CM

## 2018-05-02 ENCOUNTER — Ambulatory Visit
Admission: RE | Admit: 2018-05-02 | Discharge: 2018-05-02 | Disposition: A | Discharge status: Home / Self Care | Payer: Medicare HMO | Source: Ambulatory Visit | Attending: Gastroenterology | Admitting: Gastroenterology

## 2018-05-02 DIAGNOSIS — R634 Abnormal weight loss: Secondary | ICD-10-CM

## 2018-05-02 MED ORDER — IOPAMIDOL (ISOVUE-300) INJECTION 61%
100.0000 mL | Freq: Once | INTRAVENOUS | Status: AC | PRN
  Administered 2018-05-02: 100 mL via INTRAVENOUS

## 2018-05-12 ENCOUNTER — Ambulatory Visit: Payer: Medicare HMO | Admitting: Family Medicine

## 2018-05-13 ENCOUNTER — Ambulatory Visit: Payer: Medicare HMO | Admitting: Family Medicine

## 2018-05-19 ENCOUNTER — Other Ambulatory Visit: Payer: Self-pay

## 2018-05-19 ENCOUNTER — Encounter: Payer: Self-pay | Admitting: Family Medicine

## 2018-05-19 ENCOUNTER — Ambulatory Visit (INDEPENDENT_AMBULATORY_CARE_PROVIDER_SITE_OTHER): Payer: Medicare HMO | Admitting: Family Medicine

## 2018-05-19 VITALS — BP 90/68 | HR 72 | Temp 98.0°F | Ht 63.0 in | Wt 159.2 lb

## 2018-05-19 DIAGNOSIS — N898 Other specified noninflammatory disorders of vagina: Secondary | ICD-10-CM | POA: Diagnosis not present

## 2018-05-19 DIAGNOSIS — R432 Parageusia: Secondary | ICD-10-CM | POA: Diagnosis not present

## 2018-05-19 DIAGNOSIS — R110 Nausea: Secondary | ICD-10-CM

## 2018-05-19 DIAGNOSIS — N3000 Acute cystitis without hematuria: Secondary | ICD-10-CM | POA: Diagnosis not present

## 2018-05-19 DIAGNOSIS — I959 Hypotension, unspecified: Secondary | ICD-10-CM

## 2018-05-19 LAB — POCT URINALYSIS DIPSTICK
Bilirubin, UA: NEGATIVE
Blood, UA: NEGATIVE
Glucose, UA: NEGATIVE
Ketones, UA: POSITIVE
Nitrite, UA: POSITIVE
Protein, UA: POSITIVE — AB
Spec Grav, UA: 1.02 (ref 1.010–1.025)
Urobilinogen, UA: 0.2 E.U./dL
pH, UA: 6 (ref 5.0–8.0)

## 2018-05-19 MED ORDER — SULFAMETHOXAZOLE-TRIMETHOPRIM 800-160 MG PO TABS: 1.0000 | ORAL_TABLET | Freq: Two times a day (BID) | ORAL | 0 refills | Status: AC

## 2018-05-19 NOTE — Assessment & Plan Note (Signed)
Per note from 2017 has been an issue for years. Still asymptomatic, no clear medication cause. Continue to monitor

## 2018-05-19 NOTE — Assessment & Plan Note (Signed)
Prior work up for vaginal infection negative and no improvement with diflucan. Does have UTI today which could be contributing, however, do wonder about possibility of asymptomatic bacteremia. Either way as symptoms x months will refer to look for other causes.

## 2018-05-19 NOTE — Patient Instructions (Addendum)
#  Vaginal itching - go see OB/GYN - urine today - urine with signs of infection - can try antibiotics  - still think you should plan to see the specialist   #Upset Stomach - Not sure what is causing this - I would recommend the following 1) Make sure you eat a healthy breakfast every morning -- try to eat something with whole grain carbohydrates, dairy, and protein > something like fruit, eggs, and whole wheat toast or greek yogurt with toast with peanut butter and a piece of fruit 2) Consider having light snacks between meals 3) Try to drink 8 glasses of water per day  #Taste - I do not have much to add to the specialists - continue to monitor - try to find things your like to eat or drink

## 2018-05-19 NOTE — Assessment & Plan Note (Signed)
CT Abd was normal. ENT saw patient and tried treatment for dry mouth but no improvement. Recommended continued monitoring and ENT if needed

## 2018-05-19 NOTE — Progress Notes (Signed)
Subjective:     Frances Hoffman is a 78 y.o. female presenting for No taste (has had decreased taste buds, nausea, diarrhea. Issue present for 6 to 7 months. ) and Vaginal Itching (x months. Feels like she has yeast infection. But symptoms present for a few months. No unusual odor or discharge.)     HPI   #Taste  - saw ENT on 04/24/2018  - unlikely neurologic process, prescription for Salagen to see if that helps - tried the salagen - she she tried but just made it feel like there was too much liquid in her month - was told it might be dry month  Today: part of an apple fritter and 8 oz of water and 8 oz of milk,   Most days eats 3 meals a day (though occasionally small) Skips a meal 1 day a week No snacking Typical day: drinks 4-5 glasses of water a day  #upset stomach - off and on - x 1 week - no known triggers - no vomiting - endorses nausea (though may be hunger)   - had a normal CT and colonscopy recently w/o cause - GI told her to take citracel  - has also tried fiber tablets   #vaginal itching - has been going on for months - no odor or discharge  #low bp - if she moves will feel dizzy - if she turns her head - denies lightheaded or presyncope when standing - has to make herself drink water  Review of Systems  Gastrointestinal: Positive for diarrhea and nausea. Negative for vomiting.     Social History   Tobacco Use  Smoking Status Never Smoker  Smokeless Tobacco Never Used        Objective:    BP Readings from Last 3 Encounters:  05/19/18 90/68  04/10/18 (!) 84/52  02/24/18 127/86   Wt Readings from Last 3 Encounters:  05/19/18 159 lb 4 oz (72.2 kg)  04/10/18 160 lb 12 oz (72.9 kg)  02/24/18 166 lb (75.3 kg)    BP 90/68 (BP Location: Right Arm)   Pulse 72   Temp 98 F (36.7 C)   Ht 5\' 3"  (1.6 m)   Wt 159 lb 4 oz (72.2 kg)   SpO2 95%   BMI 28.21 kg/m    Physical Exam Constitutional:      General: She is not in acute distress.    Appearance: She is well-developed. She is not diaphoretic.  HENT:     Head: Normocephalic and atraumatic.     Right Ear: Tympanic membrane and external ear normal.     Left Ear: Tympanic membrane and external ear normal.     Nose: No congestion or rhinorrhea.     Mouth/Throat:     Pharynx: No posterior oropharyngeal erythema.  Eyes:     Conjunctiva/sclera: Conjunctivae normal.  Neck:     Musculoskeletal: Neck supple.  Cardiovascular:     Rate and Rhythm: Normal rate and regular rhythm.     Heart sounds: Normal heart sounds.  Pulmonary:     Effort: Pulmonary effort is normal.  Abdominal:     General: Bowel sounds are normal. There is no distension.     Palpations: Abdomen is soft.     Tenderness: There is no abdominal tenderness. There is no guarding.  Skin:    General: Skin is warm and dry.  Neurological:     Mental Status: She is alert.    UA: +LE, + nitrites  Assessment & Plan:   Problem List Items Addressed This Visit      Cardiovascular and Mediastinum   Asx Hypotension (Chronic)    Per note from 2017 has been an issue for years. Still asymptomatic, no clear medication cause. Continue to monitor        Musculoskeletal and Integument   Vaginal itching - Primary    Prior work up for vaginal infection negative and no improvement with diflucan. Does have UTI today which could be contributing, however, do wonder about possibility of asymptomatic bacteremia. Either way as symptoms x months will refer to look for other causes.       Relevant Orders   Ambulatory referral to Obstetrics / Gynecology   POCT urinalysis dipstick     Other   Dysgeusia    CT Abd was normal. ENT saw patient and tried treatment for dry mouth but no improvement. Recommended continued monitoring and ENT if needed       Other Visit Diagnoses    Nausea       Acute cystitis without hematuria       Relevant Medications   sulfamethoxazole-trimethoprim (BACTRIM DS,SEPTRA DS) 800-160 MG  tablet   Other Relevant Orders   Urine Culture     Nausea/upset stomach occasional symptoms: recommended diet changes and increased hydration and frequency of meals     Return if symptoms worsen or fail to improve.  [name], MD

## 2018-05-21 ENCOUNTER — Encounter: Payer: Self-pay | Admitting: Family Medicine

## 2018-05-21 LAB — URINE CULTURE
MICRO NUMBER:: 323446
SPECIMEN QUALITY: ADEQUATE

## 2018-05-26 ENCOUNTER — Telehealth: Payer: Self-pay | Admitting: Family Medicine

## 2018-05-26 DIAGNOSIS — E039 Hypothyroidism, unspecified: Secondary | ICD-10-CM

## 2018-05-26 MED ORDER — LEVOTHYROXINE SODIUM 75 MCG PO TABS: 75.0000 ug | ORAL_TABLET | Freq: Every day | ORAL | 1 refills | Status: DC

## 2018-05-26 NOTE — Telephone Encounter (Signed)
Pt need refill for levothyroxine 75 mg    Sent to [facility]

## 2018-05-26 NOTE — Addendum Note (Signed)
Addended by: Kris Mouton on: 05/26/2018 10:30 AM   Modules accepted: Orders

## 2018-05-26 NOTE — Telephone Encounter (Signed)
RX sent in

## 2018-06-05 NOTE — Telephone Encounter (Signed)
Spoke with patient and relayed that information from Dr .Einar Pheasant. Patient is doing better with her symptoms, resolved. Patient states she is not much on the computers and that is why she does not see mychart messages.

## 2018-07-21 ENCOUNTER — Telehealth: Payer: Self-pay | Admitting: Family Medicine

## 2018-07-21 DIAGNOSIS — R432 Parageusia: Secondary | ICD-10-CM

## 2018-07-21 NOTE — Telephone Encounter (Signed)
Patient stated that in her last visit it was mentioned about her needing to see a specialist for her tongue.   She stated she is needing a referral sent to [facility] for her to see someone and would like to know if you are able to do this?       Best PHONE- [phone]

## 2018-07-21 NOTE — Telephone Encounter (Signed)
To her knowledge there is a doctor at [facility] who specializes in taste related issues.   Will place referral for [facility]

## 2018-07-25 NOTE — Telephone Encounter (Signed)
Called patient and she will call [employer] to ask if Frances Hoffman is in her insurance network before Referral is done. Called patient and had to leave a voicemail.

## 2018-09-09 ENCOUNTER — Telehealth: Payer: Self-pay | Admitting: Family Medicine

## 2018-09-09 DIAGNOSIS — F39 Unspecified mood [affective] disorder: Secondary | ICD-10-CM

## 2018-09-09 MED ORDER — ESCITALOPRAM OXALATE 20 MG PO TABS: ORAL_TABLET | ORAL | 3 refills | Status: DC

## 2018-09-09 NOTE — Telephone Encounter (Signed)
Refill sent to pharmacy.   

## 2018-09-09 NOTE — Telephone Encounter (Signed)
Patient called today to see if you could start prescribing this medication for her  ESCITALOPRAM 20mg   She stated since she has change providers she does not believe her previous PCP will refill this now   [facility] C/B # [phone]  If not available patient stated a detailed message could be left on V/M

## 2018-09-09 NOTE — Telephone Encounter (Signed)
Patient advised.

## 2018-10-08 ENCOUNTER — Other Ambulatory Visit: Payer: Self-pay | Admitting: Family Medicine

## 2018-10-08 DIAGNOSIS — E7849 Other hyperlipidemia: Secondary | ICD-10-CM

## 2018-10-09 ENCOUNTER — Other Ambulatory Visit: Payer: Self-pay

## 2018-10-09 DIAGNOSIS — E7849 Other hyperlipidemia: Secondary | ICD-10-CM

## 2018-10-09 MED ORDER — ATORVASTATIN CALCIUM 20 MG PO TABS: 20.0000 mg | ORAL_TABLET | Freq: Every day | ORAL | 1 refills | Status: DC

## 2018-12-08 ENCOUNTER — Telehealth: Payer: Self-pay | Admitting: *Deleted

## 2018-12-08 NOTE — Telephone Encounter (Signed)
Patient advised and verbalized understanding 

## 2018-12-08 NOTE — Telephone Encounter (Signed)
Patient called stating that her blood pressure is still running low and it is 89/69 today. Patient stated that she is not having any symptoms from her blood pressure being low, but a little concerned. Patient stated that she saw an advertisement on a new thyroid medication [employer] and wants to know if she should switch to that since she has been on the other one for years? Dr. Einar Pheasant is out of the office.

## 2018-12-08 NOTE — Telephone Encounter (Signed)
That BP is ok as long as she is not symptomatic I would not switch thyroid medicines - she is on the preferred therapy

## 2019-01-08 ENCOUNTER — Telehealth: Payer: Self-pay | Admitting: Family Medicine

## 2019-01-08 ENCOUNTER — Ambulatory Visit (INDEPENDENT_AMBULATORY_CARE_PROVIDER_SITE_OTHER): Payer: Medicare HMO

## 2019-01-08 VITALS — Wt 163.0 lb

## 2019-01-08 DIAGNOSIS — Z0000 Encounter for general adult medical examination without abnormal findings: Secondary | ICD-10-CM

## 2019-01-08 NOTE — Telephone Encounter (Signed)
Per record patient has not had Shingrix vaccine series just the Zostavax in 2015. [place] for patient to get this? If so I will let patient know how that works with insurance

## 2019-01-08 NOTE — Telephone Encounter (Signed)
Patient's husband Orpah Greek called, he would like to know if the patient is due for her shingrix vaccine.   Can you look into this for me?   If so, patient has appointment coming up on the 16th, can she get this then?

## 2019-01-08 NOTE — Telephone Encounter (Signed)
Patient advised and verbalized understanding 

## 2019-01-08 NOTE — Progress Notes (Signed)
Subjective:   ARLYNE EHLER is a 78 y.o. female who presents for Medicare Annual (Subsequent) preventive examination.  Review of Systems: N/A   This visit is being conducted through telemedicine via telephone at the nurse health advisor's home address due to the COVID-19 pandemic. This patient has given me verbal consent via doximity to conduct this visit, patient states they are participating from their home address. Patient and myself are on the telephone call. There is no referral for this visit. Some vital signs may be absent or patient reported.    Patient identification: identified by name, DOB, and current address   Cardiac Risk Factors include: advanced age (>20men, >85 women)     Objective:     Vitals: Wt 163 lb (73.9 kg)   BMI 28.87 kg/m   Body mass index is 28.87 kg/m.  Advanced Directives 01/08/2019 02/23/2018 10/26/2015 08/24/2015 02/17/2015 04/03/2014 02/25/2014  Does Patient Have a Medical Advance Directive? Yes No Yes Yes Yes No Yes  Type of Paramedic of [place];Living will - Living will [place];Living will Living will - Living will  Does patient want to make changes to medical advance directive? - - No - Patient declined - No - Patient declined - -  Copy of [place] in Chart? No - copy requested - No - copy requested - No - copy requested - No - copy requested  Would patient like information on creating a medical advance directive? - No - Patient declined - - - No - patient declined information -    Tobacco Social History   Tobacco Use  Smoking Status Never Smoker  Smokeless Tobacco Never Used     Counseling given: Not Answered   Clinical Intake:  Pre-visit preparation completed: Yes  Pain : No/denies pain     Nutritional Risks: Nausea/ vomitting/ diarrhea(diarrhea sometimes) Diabetes: No  How often do you need to have someone help you when you read instructions, pamphlets, or other  written materials from your doctor or pharmacy?: 1 - Never What is the last grade level you completed in school?: 12th  Interpreter Needed?: No  Information entered by :: CJohnson, LPN  Past Medical History:  Diagnosis Date  . Adjustment disorder with mixed anxiety and depressed mood 12/12/2007   Qualifier: Diagnosis of  By: Copland MD, Frederico Hamman    . Breast cancer ([place])   . Cancer ([place]) 2000   breast had lumpectomy/radiation x36,mammo ,no chemo  . Cataract of both eyes   . Depression   . Dysuria-frequency syndrome    takes AZO  . Frozen shoulder   . Gall stones   . GERD 12/12/2007   Qualifier: Diagnosis of  By: Copland MD, Frederico Hamman    . Hyperlipidemia   . Hypothyroidism 12/12/2007   Qualifier: Diagnosis of  By: Copland MD, Frederico Hamman    . Memory loss   . Osteopenia    BMD 2004, WNL 2008  . Personal history of radiation therapy   . Pneumonia   . Recurrent UTI   . Shingles    chronic body pain, left side of body  . Shortness of breath dyspnea    when climbing stairs only  . Wears glasses    Past Surgical History:  Procedure Laterality Date  . BREAST LUMPECTOMY Left 2000  . CHOLECYSTECTOMY N/A 02/21/2015   Procedure: LAPAROSCOPIC CHOLECYSTECTOMY WITH INTRAOPERATIVE CHOLANGIOGRAM;  Surgeon: Greer Pickerel, MD;  Location: [facility];  Service: General;  Laterality: N/A;  . COLONOSCOPY    .  MULTIPLE TOOTH EXTRACTIONS     Family History  Problem Relation Age of Onset  . Osteoporosis Mother   . Parkinsonism Father   . Breast cancer Sister 5  . Breast cancer Other   . Coronary artery disease Sister   . Ovarian cancer Sister   . Heart disease Maternal Grandmother   . Heart disease Maternal Grandfather   . Heart attack Maternal Grandfather 50  . Heart disease Son   . Heart attack Son 101   Social History   Socioeconomic History  . Marital status: Married    Spouse name: Orpah Greek   . Number of children: 2  . Years of education: High school  . Highest education level: High school  graduate  Occupational History  . Not on file  Social Needs  . Financial resource strain: Not hard at all  . Food insecurity    Worry: Never true    Inability: Never true  . Transportation needs    Medical: No    Non-medical: No  Tobacco Use  . Smoking status: Never Smoker  . Smokeless tobacco: Never Used  Substance and Sexual Activity  . Alcohol use: No    Alcohol/week: 0.0 standard drinks  . Drug use: No  . Sexual activity: Not Currently    Birth control/protection: Post-menopausal  Lifestyle  . Physical activity    Days per week: 0 days    Minutes per session: 0 min  . Stress: Not at all  Relationships  . Social Herbalist on phone: Not on file    Gets together: Not on file    Attends religious service: Not on file    Active member of club or organization: Not on file    Attends meetings of clubs or organizations: Not on file    Relationship status: Not on file  Other Topics Concern  . Not on file  Social History Narrative   Lives at home with her husband. Orpah Greek   2 adult sons  - [place] and [place] Hiss   2 Grandchildren - both other   Exercise: goes to silver sneakers - at least 2 times a week   Diet: anything that tastes good, less pepsi's with the tongue   Enjoys: square dance, eating out with friends, going to church - Sunday and Wednesday    Outpatient Encounter Medications as of 01/08/2019  Medication Sig  . atorvastatin (LIPITOR) 20 MG tablet Take 1 tablet (20 mg total) by mouth at bedtime.  [name] b complex vitamins tablet Take 1 tablet by mouth daily.  . calcium carbonate (OS-CAL - DOSED IN MG OF ELEMENTAL CALCIUM) 1250 (500 CA) MG tablet Take 1 tablet by mouth daily.  . carboxymethylcellulose (REFRESH PLUS) 0.5 % SOLN Place 1 drop into both eyes 3 (three) times daily as needed (for dryness).  . Cholecalciferol (VITAMIN D3) 2000 UNITS capsule Take 2,000 Units by mouth daily.    . Cranberry 125 MG TABS Take 2 tablets by mouth.  . cyanocobalamin ([employer]  VITAMIN B12) 2000 MCG tablet Take 1 tablet (2,000 mcg total) by mouth daily.  [name] donepezil (ARICEPT) 10 MG tablet Take 1 tablet (10 mg total) by mouth at bedtime.  [name] escitalopram (LEXAPRO) 20 MG tablet One half tab daily for 4 wks then one tab by mouth daily  . fluticasone (FLONASE) 50 MCG/ACT nasal spray 1 spray 2 times daily after sinus rinses- Milta Deiters med or AYR  . ibuprofen (ADVIL,MOTRIN) 600 MG tablet Take 1 tablet (600 mg total)  by mouth every 8 (eight) hours as needed (severer pain/ headache).  [name] levothyroxine (SYNTHROID, LEVOTHROID) 75 MCG tablet Take 1 tablet (75 mcg total) by mouth daily.  . Multiple Vitamin (MULTIVITAMIN) tablet Take 1 tablet by mouth daily.    [name] nystatin ointment (MYCOSTATIN) Apply 1 application topically 2 (two) times daily.  . Omega-3 Fatty Acids (FISH OIL PO) Take 2 capsules by mouth 2 (two) times daily.  . vitamin C (ASCORBIC ACID) 500 MG tablet Take 500 mg by mouth daily.   No facility-administered encounter medications on file as of 01/08/2019.     Activities of Daily Living In your present state of health, do you have any difficulty performing the following activities: 01/08/2019  Hearing? N  Vision? N  Difficulty concentrating or making decisions? Y  Comment patient forgets sometimes  Walking or climbing stairs? Y  Comment gets fatigue and short of breath  Dressing or bathing? N  Doing errands, shopping? N  Preparing Food and eating ? N  Using the Toilet? N  In the past six months, have you accidently leaked urine? N  Do you have problems with loss of bowel control? Y  Comment has diarrhea sometimes  Managing your Medications? N  Managing your Finances? N  Housekeeping or managing your Housekeeping? N  Some recent data might be hidden    Patient Care Team: [name], MD as PCP - General (Family Medicine) Barrett, Evelene Croon, PA-C as Physician Assistant (Cardiology) Megan Salon, MD as Consulting Physician (Gynecology)    Assessment:   This  is a routine wellness examination for Florrie.  Exercise Activities and Dietary recommendations Current Exercise Habits: The patient does not participate in regular exercise at present, Exercise limited by: None identified  Goals    . Patient Stated     01/08/2019, I will maintain and continue medications as prescribed.        Fall Risk Fall Risk  01/08/2019 04/10/2018 03/26/2017 01/09/2017 10/17/2016  Falls in the past year? 0 0 No No No  Number falls in past yr: 0 0 - - -  Injury with Fall? 0 0 - - -  Risk for fall due to : Medication side effect - - - -  Follow up Falls evaluation completed;Falls prevention discussed - - - -   Is the patient's home free of loose throw rugs in walkways, pet beds, electrical cords, etc?   yes      Grab bars in the bathroom? yes      Handrails on the stairs?   yes      Adequate lighting?   yes  Timed Get Up and Go performed: N/A  Depression Screen PHQ 2/9 Scores 01/08/2019 02/24/2018 12/31/2017 12/20/2017  PHQ - 2 Score 0 4 1 1   PHQ- 9 Score 0 17 6 9      Cognitive Function MMSE - Mini Mental State Exam 01/08/2019 03/28/2017 03/26/2017 09/21/2015  Orientation to time 5 4 5 4   Orientation to Place 5 5 5 5   Registration 3 3 3 3   Attention/ Calculation 5 5 2 5   Recall 3 2 1 2   Language- name 2 objects - 2 2 2   Language- repeat 1 1 1 1   Language- follow 3 step command - 3 3 3   Language- read & follow direction - 1 1 1   Write a sentence - 1 1 1   Copy design - 1 1 0  Total score - 28 25 27   Mini Cog  Mini-Cog screen was completed.  Maximum score is 22. A value of 0 denotes this part of the MMSE was not completed or the patient failed this part of the Mini-Cog screening.    6CIT Screen 03/26/2017 11/14/2016  What Year? 0 points 0 points  What month? 0 points 0 points  What time? 0 points 0 points  Count back from 20 0 points 0 points  Months in reverse 4 points 0 points  Repeat phrase 6 points 2 points  Total Score 10 2    Immunization History   Administered Date(s) Administered  . Influenza Split 01/04/2011  . Influenza Whole 01/03/2009, 12/06/2009  . Influenza, High Dose Seasonal PF 12/11/2017, 11/27/2018  . Influenza,inj,Quad PF,6+ Mos 12/02/2012, 11/30/2014, 11/30/2015  . Influenza-Unspecified 12/04/2013  . PPD Test 12/31/2017  . Pneumococcal Conjugate-13 04/28/2014  . Pneumococcal Polysaccharide-23 11/26/2008  . Td 03/05/2006  . Tdap 11/06/2016  . Zoster 04/05/2013    Qualifies for Shingles Vaccine? Yes   Screening Tests Health Maintenance  Topic Date Due  . MAMMOGRAM  04/12/2019  . TETANUS/TDAP  11/07/2026  . INFLUENZA VACCINE  Completed  . DEXA SCAN  Completed  . PNA vac Low Risk Adult  Completed    Cancer Screenings: Lung: Low Dose CT Chest recommended if Age 59-80 years, 30 pack-year currently smoking OR have quit w/in 15years. Patient does not qualify. Breast:  Up to date on Mammogram? Yes, completed 04/11/2018   Up to date of Bone Density/Dexa? Yes, completed 12/03/2016 Colorectal: completed 03/26/2018  Additional Screenings:  Hepatitis C Screening: N/A     Plan:    Patient wants to maintain and continue medications.    I have personally reviewed and noted the following in the patient's chart:   . Medical and social history . Use of alcohol, tobacco or illicit drugs  . Current medications and supplements . Functional ability and status . Nutritional status . Physical activity . Advanced directives . List of other physicians . Hospitalizations, surgeries, and ER visits in previous 12 months . Vitals . Screenings to include cognitive, depression, and falls . Referrals and appointments  In addition, I have reviewed and discussed with patient certain preventive protocols, quality metrics, and best practice recommendations. A written personalized care plan for preventive services as well as general preventive health recommendations were provided to patient.     Andrez Grime, LPN  [id number]

## 2019-01-08 NOTE — Patient Instructions (Addendum)
Ms. Frances Hoffman , Thank you for taking time to come for your Medicare Wellness Visit. I appreciate your ongoing commitment to your health goals. Please review the following plan we discussed and let me know if I can assist you in the future.   Screening recommendations/referrals: Colonoscopy: up to date, completed 03/26/2018 Mammogram: up to date, completed 04/11/2018 Bone Density: up to date, completed 12/03/2016 Recommended yearly ophthalmology/optometry visit for glaucoma screening and checkup Recommended yearly dental visit for hygiene and checkup  Vaccinations: Influenza vaccine: up to date, completed 11/27/2018 Pneumococcal vaccine: Completed series Tdap vaccine: up to date, completed 11/06/2016 Shingles vaccine: will check with insurance     Advanced directives: Please bring a copy of your POA (Power of [place]) and/or Living Will to your next appointment.   Conditions/risks identified: hypotension  Next appointment: 01/19/2019 @ 9:40 am    Preventive Care 65 Years and Older, Female Preventive care refers to lifestyle choices and visits with your health care provider that can promote health and wellness. What does preventive care include?  A yearly physical exam. This is also called an annual well check.  Dental exams once or twice a year.  Routine eye exams. Ask your health care provider how often you should have your eyes checked.  Personal lifestyle choices, including:  Daily care of your teeth and gums.  Regular physical activity.  Eating a healthy diet.  Avoiding tobacco and drug use.  Limiting alcohol use.  Practicing safe sex.  Taking low-dose aspirin every day.  Taking vitamin and mineral supplements as recommended by your health care provider. What happens during an annual well check? The services and screenings done by your health care provider during your annual well check will depend on your age, overall health, lifestyle risk factors, and family history of  disease. Counseling  Your health care provider may ask you questions about your:  Alcohol use.  Tobacco use.  Drug use.  Emotional well-being.  Home and relationship well-being.  Sexual activity.  Eating habits.  History of falls.  Memory and ability to understand (cognition).  Work and work Statistician.  Reproductive health. Screening  You may have the following tests or measurements:  Height, weight, and BMI.  Blood pressure.  Lipid and cholesterol levels. These may be checked every 5 years, or more frequently if you are over 25 years old.  Skin check.  Lung cancer screening. You may have this screening every year starting at age 24 if you have a 30-pack-year history of smoking and currently smoke or have quit within the past 15 years.  Fecal occult blood test (FOBT) of the stool. You may have this test every year starting at age 13.  Flexible sigmoidoscopy or colonoscopy. You may have a sigmoidoscopy every 5 years or a colonoscopy every 10 years starting at age 57.  Hepatitis C blood test.  Hepatitis B blood test.  Sexually transmitted disease (STD) testing.  Diabetes screening. This is done by checking your blood sugar (glucose) after you have not eaten for a while (fasting). You may have this done every 1-3 years.  Bone density scan. This is done to screen for osteoporosis. You may have this done starting at age 62.  Mammogram. This may be done every 1-2 years. Talk to your health care provider about how often you should have regular mammograms. Talk with your health care provider about your test results, treatment options, and if necessary, the need for more tests. Vaccines  Your health care provider may recommend certain  vaccines, such as:  Influenza vaccine. This is recommended every year.  Tetanus, diphtheria, and acellular pertussis (Tdap, Td) vaccine. You may need a Td booster every 10 years.  Zoster vaccine. You may need this after age 73.   Pneumococcal 13-valent conjugate (PCV13) vaccine. One dose is recommended after age 9.  Pneumococcal polysaccharide (PPSV23) vaccine. One dose is recommended after age 75. Talk to your health care provider about which screenings and vaccines you need and how often you need them. This information is not intended to replace advice given to you by your health care provider. Make sure you discuss any questions you have with your health care provider. Document Released: 03/18/2015 Document Revised: 11/09/2015 Document Reviewed: 12/21/2014 Elsevier Interactive Patient Education  2017 [employer] Prevention in the Home Falls can cause injuries. They can happen to people of all ages. There are many things you can do to make your home safe and to help prevent falls. What can I do on the outside of my home?  Regularly fix the edges of walkways and driveways and fix any cracks.  Remove anything that might make you trip as you walk through a door, such as a raised step or threshold.  Trim any bushes or trees on the path to your home.  Use bright outdoor lighting.  Clear any walking paths of anything that might make someone trip, such as rocks or tools.  Regularly check to see if handrails are loose or broken. Make sure that both sides of any steps have handrails.  Any raised decks and porches should have guardrails on the edges.  Have any leaves, snow, or ice cleared regularly.  Use sand or salt on walking paths during winter.  Clean up any spills in your garage right away. This includes oil or grease spills. What can I do in the bathroom?  Use night lights.  Install grab bars by the toilet and in the tub and shower. Do not use towel bars as grab bars.  Use non-skid mats or decals in the tub or shower.  If you need to sit down in the shower, use a plastic, non-slip stool.  Keep the floor dry. Clean up any water that spills on the floor as soon as it happens.  Remove soap  buildup in the tub or shower regularly.  Attach bath mats securely with double-sided non-slip rug tape.  Do not have throw rugs and other things on the floor that can make you trip. What can I do in the bedroom?  Use night lights.  Make sure that you have a light by your bed that is easy to reach.  Do not use any sheets or blankets that are too big for your bed. They should not hang down onto the floor.  Have a firm chair that has side arms. You can use this for support while you get dressed.  Do not have throw rugs and other things on the floor that can make you trip. What can I do in the kitchen?  Clean up any spills right away.  Avoid walking on wet floors.  Keep items that you use a lot in easy-to-reach places.  If you need to reach something above you, use a strong step stool that has a grab bar.  Keep electrical cords out of the way.  Do not use floor polish or wax that makes floors slippery. If you must use wax, use non-skid floor wax.  Do not have throw rugs and other things  on the floor that can make you trip. What can I do with my stairs?  Do not leave any items on the stairs.  Make sure that there are handrails on both sides of the stairs and use them. Fix handrails that are broken or loose. Make sure that handrails are as long as the stairways.  Check any carpeting to make sure that it is firmly attached to the stairs. Fix any carpet that is loose or worn.  Avoid having throw rugs at the top or bottom of the stairs. If you do have throw rugs, attach them to the floor with carpet tape.  Make sure that you have a light switch at the top of the stairs and the bottom of the stairs. If you do not have them, ask someone to add them for you. What else can I do to help prevent falls?  Wear shoes that:  Do not have high heels.  Have rubber bottoms.  Are comfortable and fit you well.  Are closed at the toe. Do not wear sandals.  If you use a stepladder:  Make  sure that it is fully opened. Do not climb a closed stepladder.  Make sure that both sides of the stepladder are locked into place.  Ask someone to hold it for you, if possible.  Clearly mark and make sure that you can see:  Any grab bars or handrails.  First and last steps.  Where the edge of each step is.  Use tools that help you move around (mobility aids) if they are needed. These include:  Canes.  Walkers.  Scooters.  Crutches.  Turn on the lights when you go into a dark area. Replace any light bulbs as soon as they burn out.  Set up your furniture so you have a clear path. Avoid moving your furniture around.  If any of your floors are uneven, fix them.  If there are any pets around you, be aware of where they are.  Review your medicines with your doctor. Some medicines can make you feel dizzy. This can increase your chance of falling. Ask your doctor what other things that you can do to help prevent falls. This information is not intended to replace advice given to you by your health care provider. Make sure you discuss any questions you have with your health care provider. Document Released: 12/16/2008 Document Revised: 07/28/2015 Document Reviewed: 03/26/2014 Elsevier Interactive Patient Education  2017 [employer].

## 2019-01-08 NOTE — Progress Notes (Signed)
PCP notes:  Health Maintenance: Will check with insurance to see if Shingrix is covered   Abnormal Screenings: none   Patient concerns: none   Nurse concerns: none   Next PCP appt.: 01/19/2019 @ 9:40 am

## 2019-01-08 NOTE — Telephone Encounter (Signed)
Yes, even if the patient had zostavax it is still recommended to get Shingrix. The new vaccine provides better protection.   [name]

## 2019-01-19 ENCOUNTER — Encounter: Payer: Self-pay | Admitting: Family Medicine

## 2019-01-19 ENCOUNTER — Ambulatory Visit (INDEPENDENT_AMBULATORY_CARE_PROVIDER_SITE_OTHER): Payer: Medicare HMO | Admitting: Family Medicine

## 2019-01-19 ENCOUNTER — Encounter: Payer: Medicare HMO | Admitting: Family Medicine

## 2019-01-19 ENCOUNTER — Other Ambulatory Visit: Payer: Self-pay

## 2019-01-19 VITALS — BP 112/70 | HR 67 | Temp 97.5°F | Resp 16 | Ht 63.25 in | Wt 167.5 lb

## 2019-01-19 DIAGNOSIS — E7849 Other hyperlipidemia: Secondary | ICD-10-CM

## 2019-01-19 DIAGNOSIS — Z0000 Encounter for general adult medical examination without abnormal findings: Secondary | ICD-10-CM

## 2019-01-19 DIAGNOSIS — E039 Hypothyroidism, unspecified: Secondary | ICD-10-CM | POA: Diagnosis not present

## 2019-01-19 DIAGNOSIS — R7303 Prediabetes: Secondary | ICD-10-CM | POA: Diagnosis not present

## 2019-01-19 LAB — COMPREHENSIVE METABOLIC PANEL
ALT: 14 U/L (ref 0–35)
AST: 19 U/L (ref 0–37)
Albumin: 4 g/dL (ref 3.5–5.2)
Alkaline Phosphatase: 88 U/L (ref 39–117)
BUN: 14 mg/dL (ref 6–23)
CO2: 28 mEq/L (ref 19–32)
Calcium: 9.6 mg/dL (ref 8.4–10.5)
Chloride: 103 mEq/L (ref 96–112)
Creatinine, Ser: 0.93 mg/dL (ref 0.40–1.20)
GFR: 58.26 mL/min — ABNORMAL LOW (ref >60.00)
Glucose, Bld: 81 mg/dL (ref 70–99)
Potassium: 4.3 mEq/L (ref 3.5–5.1)
Sodium: 139 mEq/L (ref 135–145)
Total Bilirubin: 0.4 mg/dL (ref 0.2–1.2)
Total Protein: 7.1 g/dL (ref 6.0–8.3)

## 2019-01-19 LAB — TSH: TSH: 1.92 u[IU]/mL (ref 0.35–4.50)

## 2019-01-19 LAB — LIPID PANEL
Cholesterol: 181 mg/dL (ref 0–200)
HDL: 42.2 mg/dL (ref >39.00)
LDL Cholesterol: 108 mg/dL — ABNORMAL HIGH (ref 0–99)
NonHDL: 139.17
Total CHOL/HDL Ratio: 4
Triglycerides: 157 mg/dL — ABNORMAL HIGH (ref 0.0–149.0)
VLDL: 31.4 mg/dL (ref 0.0–40.0)

## 2019-01-19 LAB — HEMOGLOBIN A1C: Hgb A1c MFr Bld: 5.7 % (ref 4.6–6.5)

## 2019-01-19 NOTE — Progress Notes (Signed)
Annual Exam   Chief Complaint:  Chief Complaint  Patient presents with  . Annual Exam    part 2    History of Present Illness:  Ms. JEWELIANNA BRAUM is a 78 y.o. G2P2000 who LMP was No LMP recorded. Patient is postmenopausal., presents today for her annual examination.     #Leg concern - occasionally feels like someone puts a pin in her leg - only one spot - different spots - only lasts a second  #Left leg skin - dry and rough - painful skin - symptoms x 6 month - puts lotion on it occasionally    Nutrition She does get adequate calcium and Vitamin D in her diet. Diet: salad, greens, chicken, eating a lot of cheese Exercise: not going to gym since Covid - will go for a walk occasionally   Safety The patient wears seatbelts: yes.     The patient feels safe at home and in their relationships: yes.    Weight Wt Readings from Last 3 Encounters:  01/19/19 167 lb 8 oz (76 kg)  01/08/19 163 lb (73.9 kg)  05/19/18 159 lb 4 oz (72.2 kg)   Patient has high BMI  BMI Readings from Last 1 Encounters:  01/19/19 29.44 kg/m     Chronic disease screening Blood pressure monitoring:  BP Readings from Last 3 Encounters:  01/19/19 112/70  05/19/18 90/68  04/10/18 (!) 84/52    Lipid Monitoring: Indication for screening: age >23, obesity, diabetes, family hx, CV risk factors.  Lipid screening: Yes  Lab Results  Component Value Date   CHOL 175 11/26/2017   HDL 46 11/26/2017   LDLCALC 92 11/26/2017   LDLDIRECT 92.0 04/20/2014   TRIG 185 (H) 11/26/2017   CHOLHDL 3.8 11/26/2017     Diabetes Screening: age >16, overweight, family hx, PCOS, hx of gestational diabetes, at risk ethnicity Diabetes Screening screening: Yes  Lab Results  Component Value Date   HGBA1C 5.8 (H) 11/26/2017     Past Medical History:  Diagnosis Date  . Adjustment disorder with mixed anxiety and depressed mood 12/12/2007   Qualifier: Diagnosis of  By: Copland MD, Frederico Hamman    . Breast cancer  ([place])   . Cancer ([place]) 2000   breast had lumpectomy/radiation x36,mammo ,no chemo  . Cataract of both eyes   . Depression   . Dysuria-frequency syndrome    takes AZO  . Frozen shoulder   . Gall stones   . GERD 12/12/2007   Qualifier: Diagnosis of  By: Copland MD, Frederico Hamman    . Hyperlipidemia   . Hypothyroidism 12/12/2007   Qualifier: Diagnosis of  By: Copland MD, Frederico Hamman    . Memory loss   . Osteopenia    BMD 2004, WNL 2008  . Personal history of radiation therapy   . Pneumonia   . Recurrent UTI   . Shingles    chronic body pain, left side of body  . Shortness of breath dyspnea    when climbing stairs only  . Wears glasses     Past Surgical History:  Procedure Laterality Date  . BREAST LUMPECTOMY Left 2000  . CHOLECYSTECTOMY N/A 02/21/2015   Procedure: LAPAROSCOPIC CHOLECYSTECTOMY WITH INTRAOPERATIVE CHOLANGIOGRAM;  Surgeon: Greer Pickerel, MD;  Location: [facility];  Service: General;  Laterality: N/A;  . COLONOSCOPY    . MULTIPLE TOOTH EXTRACTIONS      Prior to Admission medications   Medication Sig Start Date End Date Taking? Authorizing Provider  atorvastatin (LIPITOR) 20 MG tablet Take  1 tablet (20 mg total) by mouth at bedtime. 10/09/18  Yes [name], MD  b complex vitamins tablet Take 1 tablet by mouth daily.   Yes [provider]  calcium carbonate (OS-CAL - DOSED IN MG OF ELEMENTAL CALCIUM) 1250 (500 CA) MG tablet Take 1 tablet by mouth daily.   Yes [provider]  carboxymethylcellulose (REFRESH PLUS) 0.5 % SOLN Place 1 drop into both eyes 3 (three) times daily as needed (for dryness).   Yes [provider]  Cholecalciferol (VITAMIN D3) 2000 UNITS capsule Take 2,000 Units by mouth daily.     Yes [provider]  Cranberry 125 MG TABS Take 2 tablets by mouth.   Yes [provider]  cyanocobalamin ([facility] VITAMIN B12) 2000 MCG tablet Take 1 tablet (2,000 mcg total) by mouth daily. 02/04/18  Yes Opalski, Deborah, DO  donepezil  (ARICEPT) 10 MG tablet Take 1 tablet (10 mg total) by mouth at bedtime. 07/02/17  Yes Marcial Pacas, MD  escitalopram (LEXAPRO) 20 MG tablet One half tab daily for 4 wks then one tab by mouth daily 09/09/18  Yes [name], MD  ibuprofen (ADVIL,MOTRIN) 600 MG tablet Take 1 tablet (600 mg total) by mouth every 8 (eight) hours as needed (severer pain/ headache). 02/24/18  Yes Opalski, Neoma Laming, DO  levothyroxine (SYNTHROID, LEVOTHROID) 75 MCG tablet Take 1 tablet (75 mcg total) by mouth daily. 05/26/18  Yes [name], MD  Multiple Vitamin (MULTIVITAMIN) tablet Take 1 tablet by mouth daily.     Yes [provider]  nystatin ointment (MYCOSTATIN) Apply 1 application topically 2 (two) times daily. 04/10/18  Yes [name], MD  Omega-3 Fatty Acids (FISH OIL PO) Take 2 capsules by mouth 2 (two) times daily.   Yes [provider]  vitamin C (ASCORBIC ACID) 500 MG tablet Take 500 mg by mouth daily.   Yes [provider]  fluticasone (FLONASE) 50 MCG/ACT nasal spray 1 spray 2 times daily after sinus rinses- Milta Deiters med or AYR Patient not taking: Reported on 01/19/2019 12/20/17   Mellody Dance, DO    Allergies  Allergen Reactions  . Ciprofloxacin Other (See Comments)    Unknown (vertigo??)  . Citalopram Hydrobromide Other (See Comments)    Intrusive/ odd thoughts   . Flagyl [Metronidazole] Other (See Comments)    Unknown??  . Oxycodone Other (See Comments)    Headaches    Gynecologic History: No LMP recorded. Patient is postmenopausal.  Obstetric History: G2P2000  Social History   Socioeconomic History  . Marital status: Married    Spouse name: Orpah Greek   . Number of children: 2  . Years of education: High school  . Highest education level: High school graduate  Occupational History  . Not on file  Social Needs  . Financial resource strain: Not hard at all  . Food insecurity    Worry: Never true    Inability: Never true  . Transportation needs     Medical: No    Non-medical: No  Tobacco Use  . Smoking status: Never Smoker  . Smokeless tobacco: Never Used  Substance and Sexual Activity  . Alcohol use: No    Alcohol/week: 0.0 standard drinks  . Drug use: No  . Sexual activity: Not Currently    Birth control/protection: Post-menopausal  Lifestyle  . Physical activity    Days per week: 0 days    Minutes per session: 0 min  . Stress: Not at all  Relationships  .  Social Herbalist on phone: Not on file    Gets together: Not on file    Attends religious service: Not on file    Active member of club or organization: Not on file    Attends meetings of clubs or organizations: Not on file    Relationship status: Not on file  . Intimate partner violence    Fear of current or ex partner: No    Emotionally abused: No    Physically abused: No    Forced sexual activity: No  Other Topics Concern  . Not on file  Social History Narrative   Lives at home with her husband. Orpah Greek   2 adult sons  - [place] and Randall Hiss   2 Grandchildren - both other   Exercise: goes to [employer] - at least 2 times a week   Diet: anything that tastes good, less pepsi's with the tongue   Enjoys: square dance, eating out with friends, going to church - Sunday and Wednesday    Family History  Problem Relation Age of Onset  . Osteoporosis Mother   . Parkinsonism Father   . Breast cancer Sister 59  . Breast cancer Other   . Coronary artery disease Sister   . Ovarian cancer Sister   . Heart disease Maternal Grandmother   . Heart disease Maternal Grandfather   . Heart attack Maternal Grandfather 50  . Heart disease Son   . Heart attack Son 48    Review of Systems  Constitutional: Negative for chills and fever.  HENT: Negative for hearing loss.   Eyes: Positive for blurred vision (seeing dr for cataracts). Negative for double vision.  Respiratory: Negative for cough.   Cardiovascular: Negative for chest pain.  Gastrointestinal:  Positive for diarrhea. Negative for blood in stool and heartburn.  Genitourinary: Negative.   Musculoskeletal: Positive for joint pain and myalgias.  Skin: Positive for rash. Negative for itching.  Neurological: Positive for tingling. Negative for headaches.  Endo/Heme/Allergies: Does not bruise/bleed easily.  Psychiatric/Behavioral: Positive for depression.     Physical Exam BP 112/70   Pulse 67   Temp (!) 97.5 F (36.4 C)   Resp 16   Ht 5' 3.25" (1.607 m)   Wt 167 lb 8 oz (76 kg)   BMI 29.44 kg/m    BP Readings from Last 3 Encounters:  01/19/19 112/70  05/19/18 90/68  04/10/18 (!) 84/52      Physical Exam Constitutional:      General: She is not in acute distress.    Appearance: She is well-developed. She is not diaphoretic.  HENT:     Head: Normocephalic and atraumatic.     Right Ear: Tympanic membrane and external ear normal.     Left Ear: Tympanic membrane and external ear normal.     Nose:     Comments: Wearing mask Eyes:     General: No scleral icterus.    Conjunctiva/sclera: Conjunctivae normal.  Neck:     Musculoskeletal: Neck supple.  Cardiovascular:     Rate and Rhythm: Normal rate and regular rhythm.     Heart sounds: No murmur.  Pulmonary:     Effort: Pulmonary effort is normal. No respiratory distress.     Breath sounds: Normal breath sounds. No wheezing.  Musculoskeletal: Normal range of motion.  Lymphadenopathy:     Cervical: No cervical adenopathy.  Skin:    General: Skin is warm and dry.     Capillary Refill: Capillary refill  takes less than 2 seconds.     Comments: Bilateral thighs w/o sign of rash. Skin is rough to the touch without scale.   Neurological:     Mental Status: She is alert and oriented to person, place, and time. Mental status is at baseline.     Deep Tendon Reflexes: Reflexes normal.  Psychiatric:        Mood and Affect: Mood normal.        Behavior: Behavior normal.     Results:  PHQ-9:    Clinical Support from  01/08/2019 in [place] at [facility]  PHQ-9 Total Score  0        Assessment: 78 y.o. G27P2000 female here for routine annual physical examination.  Plan: Problem List Items Addressed This Visit      Endocrine   Hypothyroidism (Chronic)   Relevant Orders   TSH     Other   Hyperlipidemia (Chronic)   Relevant Orders   Lipid Profile   Comprehensive metabolic panel   Prediabetes - Primary   Relevant Orders   Hemoglobin A1c   Comprehensive metabolic panel    Other Visit Diagnoses    Annual physical exam       Relevant Orders   Comprehensive metabolic panel      Screening: -- Blood pressure screen normal -- cholesterol screening: taking medication, repeat today. Cont med -- Weight screening: overweight: continue to monitor -- Diabetes Screening: will obtain - prediabetes last year, working on diet -- Nutrition: normal    The 10-year ASCVD risk score Mikey Bussing DC Jr., et al., 2013) is: 16.9%   Values used to calculate the score:     Age: 21 years     Sex: Female     Is Non-Hispanic African American: No     Diabetic: No     Tobacco smoker: No     Systolic Blood Pressure: XX123456 mmHg     Is BP treated: No     HDL Cholesterol: 46 mg/dL     Total Cholesterol: 175 mg/dL  -- Statin therapy for Age 38-75 with CVD risk >7.5%  Psych -- Depression screening (PHQ-9):    Clinical Support from 01/08/2019 in [place] at [facility]  PHQ-9 Total Score  0       Safety -- tobacco screening: not using -- alcohol screening:  low-risk usage. -- no evidence of domestic violence or intimate partner violence.   Immunizations -- flu vaccine up to date -- TDAP q10 years up to date -- PPSV-23 (19-64 with chronic disease or smoking) up to date --- Pt has already started Shingles series and advised to continue  Encouraged regular exercise - doing videos at home during pandemic Encouraged healthy diet  Dry skin - use lotion  [name], MD

## 2019-01-19 NOTE — Patient Instructions (Signed)
#  Skin concern - dry skin - use regular lotion - after every shower and ideally twice daily  #Start going back to the gym to get regular exercise added back to maintain a healthy weight

## 2019-01-20 ENCOUNTER — Encounter: Payer: Self-pay | Admitting: Family Medicine

## 2019-02-17 ENCOUNTER — Other Ambulatory Visit: Payer: Self-pay

## 2019-02-17 DIAGNOSIS — E039 Hypothyroidism, unspecified: Secondary | ICD-10-CM

## 2019-02-17 MED ORDER — LEVOTHYROXINE SODIUM 75 MCG PO TABS: 75.0000 ug | ORAL_TABLET | Freq: Every day | ORAL | 2 refills | Status: DC

## 2019-02-18 ENCOUNTER — Telehealth: Payer: Self-pay | Admitting: *Deleted

## 2019-02-18 NOTE — Telephone Encounter (Signed)
Patient's husband left a voicemail stating that the pharmacy needs a call to let them know if it is okay to change patient's levothyroxine to a different manufacturer? [facility] pharmacist at [place] left a voicemail stating that they faxed over a request about changing patient's levothyroxine to a different manufacturer.Frances Hoffman requested a call back regarding this. [phone] .

## 2019-02-18 NOTE — Telephone Encounter (Signed)
As long as the patient is Ok with the switch that should be fine.

## 2019-02-18 NOTE — Telephone Encounter (Signed)
Patient's husband has left another message asking for someone to call him back about this medication.

## 2019-02-18 NOTE — Telephone Encounter (Signed)
Spoke with Rosanne Sack at [facility] and advised of Dr. Verda Cumins comments. I called and spoke with patient and advised of Dr Verda Cumins comments.  Manufacturer is been switched to [place].

## 2019-03-17 ENCOUNTER — Encounter: Payer: Self-pay | Admitting: Family Medicine

## 2019-03-17 DIAGNOSIS — K580 Irritable bowel syndrome with diarrhea: Secondary | ICD-10-CM | POA: Insufficient documentation

## 2019-03-23 ENCOUNTER — Ambulatory Visit: Payer: Medicare HMO

## 2019-03-24 ENCOUNTER — Ambulatory Visit: Payer: Medicare Other | Attending: Internal Medicine

## 2019-03-24 DIAGNOSIS — Z23 Encounter for immunization: Secondary | ICD-10-CM | POA: Insufficient documentation

## 2019-03-24 NOTE — Progress Notes (Signed)
Covid-19 Vaccination Clinic  Name:  ADILA GONZAGA    MRN: [id number] DOB: 04-11-1940  03/24/2019  Ms. Riskin was observed post Covid-19 immunization for 15 minutes without incidence. She was provided with Vaccine Information Sheet and instruction to access the V-Safe system.   Ms. Roland was instructed to call 911 with any severe reactions post vaccine: [name] Difficulty breathing  . Swelling of your face and throat  . A fast heartbeat  . A bad rash all over your body  . Dizziness and weakness    Immunizations Administered    Name Date Dose VIS Date Route   Pfizer COVID-19 Vaccine 03/24/2019 12:48 PM 0.3 mL 02/13/2019 Intramuscular   Manufacturer: [employer], [employer]   Lot: [id number]   NDC: [address]-5

## 2019-03-26 ENCOUNTER — Other Ambulatory Visit: Payer: Self-pay | Admitting: Family Medicine

## 2019-03-26 DIAGNOSIS — Z1231 Encounter for screening mammogram for malignant neoplasm of breast: Secondary | ICD-10-CM

## 2019-04-01 ENCOUNTER — Encounter: Payer: Self-pay | Admitting: Family Medicine

## 2019-04-01 ENCOUNTER — Ambulatory Visit (INDEPENDENT_AMBULATORY_CARE_PROVIDER_SITE_OTHER): Payer: Medicare HMO | Admitting: Family Medicine

## 2019-04-01 ENCOUNTER — Other Ambulatory Visit: Payer: Self-pay

## 2019-04-01 ENCOUNTER — Telehealth: Payer: Self-pay

## 2019-04-01 VITALS — BP 82/60 | HR 84 | Temp 98.0°F | Resp 14 | Ht 63.25 in | Wt 169.0 lb

## 2019-04-01 DIAGNOSIS — I959 Hypotension, unspecified: Secondary | ICD-10-CM | POA: Diagnosis not present

## 2019-04-01 DIAGNOSIS — E039 Hypothyroidism, unspecified: Secondary | ICD-10-CM | POA: Diagnosis not present

## 2019-04-01 DIAGNOSIS — R42 Dizziness and giddiness: Secondary | ICD-10-CM | POA: Diagnosis not present

## 2019-04-01 LAB — CBC WITH DIFFERENTIAL/PLATELET
Basophils Absolute: 0 10*3/uL (ref 0.0–0.1)
Basophils Relative: 0.3 % (ref 0.0–3.0)
Eosinophils Absolute: 0 10*3/uL (ref 0.0–0.7)
Eosinophils Relative: 0.6 % (ref 0.0–5.0)
HCT: 43.8 % (ref 36.0–46.0)
Hemoglobin: 14.8 g/dL (ref 12.0–15.0)
Lymphocytes Relative: 29.6 % (ref 12.0–46.0)
Lymphs Abs: 2.1 10*3/uL (ref 0.7–4.0)
MCHC: 33.8 g/dL (ref 30.0–36.0)
MCV: 91.8 fl (ref 78.0–100.0)
Monocytes Absolute: 0.7 10*3/uL (ref 0.1–1.0)
Monocytes Relative: 10.3 % (ref 3.0–12.0)
Neutro Abs: 4.2 10*3/uL (ref 1.4–7.7)
Neutrophils Relative %: 59.2 % (ref 43.0–77.0)
Platelets: 262 10*3/uL (ref 150.0–400.0)
RBC: 4.77 Mil/uL (ref 3.87–5.11)
RDW: 13.8 % (ref 11.5–15.5)
WBC: 7.1 10*3/uL (ref 4.0–10.5)

## 2019-04-01 LAB — COMPREHENSIVE METABOLIC PANEL
ALT: 15 U/L (ref 0–35)
AST: 20 U/L (ref 0–37)
Albumin: 4 g/dL (ref 3.5–5.2)
Alkaline Phosphatase: 83 U/L (ref 39–117)
BUN: 14 mg/dL (ref 6–23)
CO2: 27 mEq/L (ref 19–32)
Calcium: 9.8 mg/dL (ref 8.4–10.5)
Chloride: 103 mEq/L (ref 96–112)
Creatinine, Ser: 0.99 mg/dL (ref 0.40–1.20)
GFR: 54.17 mL/min — ABNORMAL LOW (ref >60.00)
Glucose, Bld: 90 mg/dL (ref 70–99)
Potassium: 4.2 mEq/L (ref 3.5–5.1)
Sodium: 137 mEq/L (ref 135–145)
Total Bilirubin: 0.5 mg/dL (ref 0.2–1.2)
Total Protein: 7 g/dL (ref 6.0–8.3)

## 2019-04-01 LAB — TSH: TSH: 4.75 u[IU]/mL — ABNORMAL HIGH (ref 0.35–4.50)

## 2019-04-01 NOTE — Progress Notes (Signed)
Subjective:     Frances Hoffman is a 79 y.o. female presenting for Dizziness (has been having low blood pressure)     HPI  #Dizziness - had noticed with position changes - now constant dizziness that started today - worse with fast movement - no room spinning - no palpitations - water intake: 3 cups a day, 8 oz glass - no new medications - no dizziness at rest  Had a colonoscopy 1 year ago which was fine  Review of Systems  Constitutional: Negative for chills, fatigue and fever.  HENT: Negative for congestion and sinus pain.   Eyes: Negative for visual disturbance.  Respiratory: Negative for cough and shortness of breath.   Cardiovascular: Negative for chest pain and palpitations.  Gastrointestinal: Negative for abdominal pain, blood in stool, diarrhea, nausea and vomiting.  Genitourinary: Negative for difficulty urinating, dysuria and hematuria.  Musculoskeletal: Negative for arthralgias.  Neurological: Positive for dizziness. Negative for weakness, numbness and headaches.     Social History   Tobacco Use  Smoking Status Never Smoker  Smokeless Tobacco Never Used        Objective:    BP Readings from Last 3 Encounters:  04/01/19 (!) 82/60  01/19/19 112/70  05/19/18 90/68   Wt Readings from Last 3 Encounters:  04/01/19 169 lb (76.7 kg)  01/19/19 167 lb 8 oz (76 kg)  01/08/19 163 lb (73.9 kg)    BP (!) 82/60   Pulse 84   Temp 98 F (36.7 C)   Resp 14   Ht 5' 3.25" (1.607 m)   Wt 169 lb (76.7 kg)   SpO2 94%   BMI 29.70 kg/m    Physical Exam Constitutional:      General: She is not in acute distress.    Appearance: She is well-developed. She is not diaphoretic.  HENT:     Right Ear: External ear normal.     Left Ear: External ear normal.     Nose: Nose normal.     Mouth/Throat:     Mouth: Mucous membranes are moist.  Eyes:     Extraocular Movements: Extraocular movements intact.     Conjunctiva/sclera: Conjunctivae normal.   Cardiovascular:     Rate and Rhythm: Normal rate and regular rhythm.     Heart sounds: No murmur.  Pulmonary:     Effort: Pulmonary effort is normal. No respiratory distress.     Breath sounds: Normal breath sounds. No wheezing.  Musculoskeletal:     Cervical back: Neck supple.  Skin:    General: Skin is warm and dry.     Capillary Refill: Capillary refill takes less than 2 seconds.  Neurological:     Mental Status: She is alert. Mental status is at baseline.  Psychiatric:        Mood and Affect: Mood normal.        Behavior: Behavior normal.           Assessment & Plan:   Problem List Items Addressed This Visit      Cardiovascular and Mediastinum   Asx Hypotension - Primary (Chronic)    Pt with long hx of low BP and now with symptoms of dizziness and BP lower than baseline. Encouraged increased hydration. Well appearing on exam w/o tachycardia or signs of severe dehydration. If symptoms persist pending lab results will likely consider fludrocortisone as possible treatment to raise BP. Does not appear to be related to medication side effect. ER precautions and call back discussed.  Relevant Orders   Comprehensive metabolic panel   CBC with Differential     Endocrine   Hypothyroidism (Chronic)    Will assess Thyroid in setting of blood pressure changes. Continue levothyroxine pending lab results.       Relevant Orders   TSH     Other   Dizziness   Relevant Orders   Comprehensive metabolic panel   CBC with Differential       Return if symptoms worsen or fail to improve.  [name], MD

## 2019-04-01 NOTE — Telephone Encounter (Signed)
[facility] - Client TELEPHONE ADVICE RECORD [facility] Patient Name: Frances Hoffman Gender: Female DOB: January 24, 1941 Age: 79 Y 50 M 6 D Return Phone Number: [phone] (Primary), [phone] (Secondary) Address: [address] City/State/Zip: [place] [address] Client [facility] - Client Client Site [facility] Physician Waunita Schooner- MD Contact Type Call Who Is Calling Patient / Member / Family / Caregiver Call Type Triage / Clinical Relationship To Patient Self Return Phone Number [phone] (Primary) Chief Complaint Blood Pressure Low Reason for Call Symptomatic / Request for [place] states having low blood pressure and dizziness. She says it's 93/64 in left arm Translation No Nurse Assessment Nurse: Thad Ranger, RN, Langley Gauss Date/Time (Eastern Time): 04/01/2019 9:12:24 AM Confirm and document reason for call. If symptomatic, describe symptoms. ---[name] states having low blood pressure and dizziness. She says it's 93/64 in left arm and 94 in RA. Has the patient had close contact with a person known or suspected to have the novel coronavirus illness OR traveled / lives in area with major community spread (including international travel) in the last 14 days from the onset of symptoms? * If Asymptomatic, screen for exposure and travel within the last 14 days. ---No Does the patient have any new or worsening symptoms? ---Yes Will a triage be completed? ---Yes Related visit to physician within the last 2 weeks? ---No Does the PT have any chronic conditions? (i.e. diabetes, asthma, this includes High risk factors for pregnancy, etc.) ---No Is this a behavioral health or substance abuse call? ---No Guidelines Guideline Title Affirmed Question Affirmed Notes Nurse Date/Time (Eastern Time) Low Blood Pressure [id number] Fall in systolic BP > 20 mm Hg from  normal AND [2] NOT dizzy, lightheaded, or weak Carmon, RN, Denise 04/01/2019 9:13:38 AM Disp. Time Eilene Ghazi Time) Disposition Final User 04/01/2019 9:17:41 AM SEE PCP WITHIN 3 DAYS Yes Carmon, RN, Denise PLEASE NOTE: All timestamps contained within this report are represented as [place] Standard Time. CONFIDENTIALTY NOTICE: This fax transmission is intended only for the addressee. It contains information that is legally privileged, confidential or otherwise protected from use or disclosure. If you are not the intended recipient, you are strictly prohibited from reviewing, disclosing, copying using or disseminating any of this information or taking any action in reliance on or regarding this information. If you have received this fax in error, please notify us immediately by telephone so that we can arrange for its return to [place]. Phone: [phone], Toll-Free: [phone], Fax: [phone] Page: 2 of 2 Call Id: [id number] [place] Disagree/Comply Comply [name] Understands Yes PreDisposition Call Doctor Care Advice Given Per Guideline SEE PCP WITHIN 3 DAYS: REASSURANCE AND EDUCATION: * Systolic is the first number; diastolic is the second. * Your systolic blood pressure is lower than usual, but you have told me that you are not dizzy, weak, or lightheaded. * Generally, low blood pressure is only of medical concern if it causes signs or symptoms: dizziness, fainting, shock. CALL BACK IF: * Lightheadedness, weakness, or dizziness occurs * Systolic BP under 90 * You feel sick * You become worse. CARE ADVICE given per Low Blood Pressure (Adult) guideline. Referrals REFERRED TO PCP OFFICE

## 2019-04-01 NOTE — Telephone Encounter (Signed)
See note from today

## 2019-04-01 NOTE — Patient Instructions (Signed)
#   Low Blood pressure - Drink 8 glasses of water per day - if you start having worsening dizziness or develop fever/chills or new symptoms call the office or consider going to the ER - blood work today - if your dizziness does not improve with more hydration and you are still having low blood pressure we can consider a medication to help

## 2019-04-01 NOTE — Assessment & Plan Note (Signed)
Will assess Thyroid in setting of blood pressure changes. Continue levothyroxine pending lab results.

## 2019-04-01 NOTE — Telephone Encounter (Signed)
Per chart review tab pt has already had virtual appt today with Dr Einar Pheasant.

## 2019-04-01 NOTE — Assessment & Plan Note (Signed)
Pt with long hx of low BP and now with symptoms of dizziness and BP lower than baseline. Encouraged increased hydration. Well appearing on exam w/o tachycardia or signs of severe dehydration. If symptoms persist pending lab results will likely consider fludrocortisone as possible treatment to raise BP. Does not appear to be related to medication side effect. ER precautions and call back discussed.

## 2019-04-02 ENCOUNTER — Encounter: Payer: Self-pay | Admitting: Family Medicine

## 2019-04-14 ENCOUNTER — Ambulatory Visit: Payer: Medicare HMO | Attending: Internal Medicine

## 2019-04-14 DIAGNOSIS — Z23 Encounter for immunization: Secondary | ICD-10-CM | POA: Insufficient documentation

## 2019-04-14 NOTE — Progress Notes (Signed)
Covid-19 Vaccination Clinic  Name:  Frances Hoffman    MRN: [id number] DOB: [birth year]  04/14/2019  Frances Hoffman was observed post Covid-19 immunization for 15 minutes without incidence. She was provided with Vaccine Information Sheet and instruction to access the V-Safe system.   Frances Hoffman was instructed to call 911 with any severe reactions post vaccine: [name] Difficulty breathing  . Swelling of your face and throat  . A fast heartbeat  . A bad rash all over your body  . Dizziness and weakness    Immunizations Administered    Name Date Dose VIS Date Route   Pfizer COVID-19 Vaccine 04/14/2019 10:21 AM 0.3 mL 02/13/2019 Intramuscular   Manufacturer: [employer], [employer]   Lot: [id number]   NDC: [address]-4

## 2019-04-16 ENCOUNTER — Other Ambulatory Visit: Payer: Self-pay | Admitting: Family Medicine

## 2019-04-16 DIAGNOSIS — E7849 Other hyperlipidemia: Secondary | ICD-10-CM

## 2019-05-06 ENCOUNTER — Other Ambulatory Visit: Payer: Self-pay

## 2019-05-06 ENCOUNTER — Ambulatory Visit
Admission: RE | Admit: 2019-05-06 | Discharge: 2019-05-06 | Disposition: A | Discharge status: Home / Self Care | Payer: Medicare HMO | Source: Ambulatory Visit | Attending: Family Medicine | Admitting: Family Medicine

## 2019-05-06 DIAGNOSIS — Z1231 Encounter for screening mammogram for malignant neoplasm of breast: Secondary | ICD-10-CM

## 2019-09-23 ENCOUNTER — Other Ambulatory Visit: Payer: Self-pay | Admitting: Family Medicine

## 2019-09-23 DIAGNOSIS — F39 Unspecified mood [affective] disorder: Secondary | ICD-10-CM

## 2019-11-05 ENCOUNTER — Other Ambulatory Visit: Payer: Self-pay | Admitting: Family Medicine

## 2019-11-05 DIAGNOSIS — E039 Hypothyroidism, unspecified: Secondary | ICD-10-CM

## 2019-11-07 ENCOUNTER — Other Ambulatory Visit: Payer: Self-pay | Admitting: Family Medicine

## 2019-11-07 DIAGNOSIS — E039 Hypothyroidism, unspecified: Secondary | ICD-10-CM

## 2019-11-11 ENCOUNTER — Telehealth: Payer: Self-pay | Admitting: Family Medicine

## 2019-11-11 DIAGNOSIS — E039 Hypothyroidism, unspecified: Secondary | ICD-10-CM

## 2019-11-11 NOTE — Telephone Encounter (Signed)
Pt said someone called her but she is not sure what it was about. She does know she is completely out of her thyroid medication and needs a refill. She said she accidentally dropped the bottle at the sink and some of her pills came out. She said if you call back to please call her on her cell phone number.

## 2019-11-11 NOTE — Telephone Encounter (Signed)
I attempted to call pt back on number that was provided. No answer. Her thyroid medication was sent into [facility] on 11/10/19.

## 2019-11-11 NOTE — Telephone Encounter (Signed)
Pt is following up on getting medication.

## 2019-12-28 ENCOUNTER — Other Ambulatory Visit: Payer: Self-pay | Admitting: Family Medicine

## 2019-12-28 DIAGNOSIS — F39 Unspecified mood [affective] disorder: Secondary | ICD-10-CM

## 2020-01-18 ENCOUNTER — Ambulatory Visit: Payer: Medicare HMO

## 2020-01-21 ENCOUNTER — Ambulatory Visit (INDEPENDENT_AMBULATORY_CARE_PROVIDER_SITE_OTHER): Payer: Medicare HMO | Admitting: Family Medicine

## 2020-01-21 ENCOUNTER — Other Ambulatory Visit: Payer: Self-pay

## 2020-01-21 ENCOUNTER — Encounter: Payer: Self-pay | Admitting: Family Medicine

## 2020-01-21 VITALS — BP 88/56 | HR 79 | Temp 97.2°F | Ht 63.5 in | Wt 175.5 lb

## 2020-01-21 DIAGNOSIS — E039 Hypothyroidism, unspecified: Secondary | ICD-10-CM

## 2020-01-21 DIAGNOSIS — E7849 Other hyperlipidemia: Secondary | ICD-10-CM

## 2020-01-21 DIAGNOSIS — Z0000 Encounter for general adult medical examination without abnormal findings: Secondary | ICD-10-CM | POA: Diagnosis not present

## 2020-01-21 DIAGNOSIS — R7303 Prediabetes: Secondary | ICD-10-CM

## 2020-01-21 DIAGNOSIS — N1831 Chronic kidney disease, stage 3a: Secondary | ICD-10-CM | POA: Diagnosis not present

## 2020-01-21 DIAGNOSIS — G3184 Mild cognitive impairment, so stated: Secondary | ICD-10-CM

## 2020-01-21 LAB — COMPREHENSIVE METABOLIC PANEL
ALT: 18 U/L (ref 0–35)
AST: 26 U/L (ref 0–37)
Albumin: 4.1 g/dL (ref 3.5–5.2)
Alkaline Phosphatase: 89 U/L (ref 39–117)
BUN: 13 mg/dL (ref 6–23)
CO2: 27 mEq/L (ref 19–32)
Calcium: 9.7 mg/dL (ref 8.4–10.5)
Chloride: 104 mEq/L (ref 96–112)
Creatinine, Ser: 1.12 mg/dL (ref 0.40–1.20)
GFR: 46.83 mL/min — ABNORMAL LOW (ref >60.00)
Glucose, Bld: 86 mg/dL (ref 70–99)
Potassium: 5.1 mEq/L (ref 3.5–5.1)
Sodium: 138 mEq/L (ref 135–145)
Total Bilirubin: 0.7 mg/dL (ref 0.2–1.2)
Total Protein: 6.8 g/dL (ref 6.0–8.3)

## 2020-01-21 LAB — LIPID PANEL
Cholesterol: 171 mg/dL (ref 0–200)
HDL: 44.1 mg/dL (ref >39.00)
LDL Cholesterol: 94 mg/dL (ref 0–99)
NonHDL: 126.59
Total CHOL/HDL Ratio: 4
Triglycerides: 161 mg/dL — ABNORMAL HIGH (ref 0.0–149.0)
VLDL: 32.2 mg/dL (ref 0.0–40.0)

## 2020-01-21 LAB — POCT GLYCOSYLATED HEMOGLOBIN (HGB A1C): Hemoglobin A1C: 5.4 % (ref 4.0–5.6)

## 2020-01-21 LAB — TSH: TSH: 3.88 u[IU]/mL (ref 0.35–4.50)

## 2020-01-21 NOTE — Assessment & Plan Note (Signed)
2/3 recall on minicog. Discussed memory tools at home. Return in 6 months for longer assessment.

## 2020-01-21 NOTE — Progress Notes (Signed)
Subjective:   Frances Hoffman is a 79 y.o. female who presents for Medicare Annual (Subsequent) preventive examination.  Review of Systems    Review of Systems  Constitutional: Negative for chills and fever.  HENT: Negative for congestion and sore throat.   Eyes: Negative for blurred vision and double vision.  Respiratory: Negative for shortness of breath.   Cardiovascular: Negative for chest pain.  Gastrointestinal: Negative for heartburn, nausea and vomiting.  Genitourinary: Negative.   Musculoskeletal: Positive for back pain. Negative for myalgias.       Left leg pain  Skin: Negative for rash.  Neurological: Negative for dizziness and headaches.  Endo/Heme/Allergies: Does not bruise/bleed easily.  Psychiatric/Behavioral: Negative for depression. The patient is not nervous/anxious.     Cardiac Risk Factors include: sedentary lifestyle;advanced age (>34men, >75 women)     Objective:    Today's Vitals   01/21/20 1017 01/21/20 1028  BP: (!) 88/56   Pulse: 79   Temp: (!) 97.2 F (36.2 C)   TempSrc: Temporal   SpO2: 96%   Weight: 175 lb 8 oz (79.6 kg)   Height: 5' 3.5" (1.613 m)   PainSc:  5    Wt Readings from Last 3 Encounters:  01/21/20 175 lb 8 oz (79.6 kg)  04/01/19 169 lb (76.7 kg)  01/19/19 167 lb 8 oz (76 kg)    Body mass index is 30.6 kg/m.  Advanced Directives 01/21/2020 01/08/2019 02/23/2018 10/26/2015 08/24/2015 02/17/2015 04/03/2014  Does Patient Have a Medical Advance Directive? Yes Yes No Yes Yes Yes No  Type of Paramedic of [place];Living will - Living will [place];Living will Living will -  Does patient want to make changes to medical advance directive? - - - No - Patient declined - No - Patient declined -  Copy of [place] in Chart? No - copy requested No - copy requested - No - copy requested - No - copy requested -  Would patient like information on  creating a medical advance directive? - - No - Patient declined - - - No - patient declined information    Current Medications (verified) Outpatient Encounter Medications as of 01/21/2020  Medication Sig  . atorvastatin (LIPITOR) 20 MG tablet TAKE 1 TABLET BY MOUTH AT BEDTIME  . b complex vitamins tablet Take 1 tablet by mouth daily.  . calcium carbonate (OS-CAL - DOSED IN MG OF ELEMENTAL CALCIUM) 1250 (500 CA) MG tablet Take 1 tablet by mouth daily.  . Calcium Citrate (CITRACAL PO) Take by mouth.  . carboxymethylcellulose (REFRESH PLUS) 0.5 % SOLN Place 1 drop into both eyes 3 (three) times daily as needed (for dryness).  . Cholecalciferol (VITAMIN D3) 2000 UNITS capsule Take 2,000 Units by mouth daily.    . Cranberry 125 MG TABS Take 2 tablets by mouth.  . cyanocobalamin ([employer] VITAMIN B12) 2000 MCG tablet Take 1 tablet (2,000 mcg total) by mouth daily.  [name] donepezil (ARICEPT) 10 MG tablet Take 1 tablet (10 mg total) by mouth at bedtime.  [name] escitalopram (LEXAPRO) 20 MG tablet Take 1 tablet by mouth once daily  . EUTHYROX 75 MCG tablet Take 1 tablet by mouth once daily  . fluticasone (FLONASE) 50 MCG/ACT nasal spray 1 spray 2 times daily after sinus rinses- Milta Deiters med or AYR  . ibuprofen (ADVIL,MOTRIN) 600 MG tablet Take 1 tablet (600 mg total) by mouth every 8 (eight) hours as needed (severer pain/ headache).  [name]  loperamide (IMODIUM) 1 MG/5ML solution Take by mouth as needed for diarrhea or loose stools. Teaspoons amounts  . Multiple Vitamin (MULTIVITAMIN) tablet Take 1 tablet by mouth daily.    [name] nystatin ointment (MYCOSTATIN) Apply 1 application topically 2 (two) times daily.  . Omega-3 Fatty Acids (FISH OIL PO) Take 2 capsules by mouth 2 (two) times daily.  [name] omeprazole (PRILOSEC) 20 MG capsule Take by mouth daily.  . vitamin C (ASCORBIC ACID) 500 MG tablet Take 500 mg by mouth daily.   No facility-administered encounter medications on file as of 01/21/2020.    Allergies  (verified) Ciprofloxacin, Citalopram hydrobromide, Flagyl [metronidazole], and Oxycodone   History: Past Medical History:  Diagnosis Date  . Adjustment disorder with mixed anxiety and depressed mood 12/12/2007   Qualifier: Diagnosis of  By: Copland MD, Frederico Hamman    . Breast cancer ([place])   . Cancer ([place]) 2000   breast had lumpectomy/radiation x36,mammo ,no chemo  . Cataract of both eyes   . Depression   . Dysuria-frequency syndrome    takes AZO  . Frozen shoulder   . Gall stones   . GERD 12/12/2007   Qualifier: Diagnosis of  By: Copland MD, Frederico Hamman    . Hyperlipidemia   . Hypothyroidism 12/12/2007   Qualifier: Diagnosis of  By: Copland MD, Frederico Hamman    . Memory loss   . Osteopenia    BMD 2004, WNL 2008  . Personal history of radiation therapy   . Pneumonia   . Recurrent UTI   . Shingles    chronic body pain, left side of body  . Shortness of breath dyspnea    when climbing stairs only  . Wears glasses    Past Surgical History:  Procedure Laterality Date  . BREAST LUMPECTOMY Left 2000  . CHOLECYSTECTOMY N/A 02/21/2015   Procedure: LAPAROSCOPIC CHOLECYSTECTOMY WITH INTRAOPERATIVE CHOLANGIOGRAM;  Surgeon: Greer Pickerel, MD;  Location: [facility];  Service: General;  Laterality: N/A;  . COLONOSCOPY    . MULTIPLE TOOTH EXTRACTIONS     Family History  Problem Relation Age of Onset  . Osteoporosis Mother   . Parkinsonism Father   . Breast cancer Sister 22  . Breast cancer Other   . Coronary artery disease Sister   . Ovarian cancer Sister   . Heart disease Maternal Grandmother   . Heart disease Maternal Grandfather   . Heart attack Maternal Grandfather 50  . Heart disease Son   . Heart attack Son 1   Social History   Socioeconomic History  . Marital status: Married    Spouse name: Orpah Greek   . Number of children: 2  . Years of education: High school  . Highest education level: High school graduate  Occupational History  . Not on file  Tobacco Use  . Smoking status: Never  Smoker  . Smokeless tobacco: Never Used  Vaping Use  . Vaping Use: Never used  Substance and Sexual Activity  . Alcohol use: No    Alcohol/week: 0.0 standard drinks  . Drug use: No  . Sexual activity: Not Currently    Birth control/protection: Post-menopausal  Other Topics Concern  . Not on file  Social History Narrative   Lives at home with her husband. Orpah Greek   2 adult sons  - [place] and Randall Hiss   2 Grandchildren - both other   Exercise: goes to [employer] - at least 2 times a week   Diet: anything that tastes good, less pepsi's with the tongue  Enjoys: square dance, eating out with friends, going to church - Sunday and Wednesday   Social Determinants of Health   Financial Resource Strain:   . Difficulty of Paying Living Expenses: Not on file  Food Insecurity:   . Worried About Charity fundraiser in the Last Year: Not on file  . Ran Out of Food in the Last Year: Not on file  Transportation Needs:   . Lack of Transportation (Medical): Not on file  . Lack of Transportation (Non-Medical): Not on file  Physical Activity:   . Days of Exercise per Week: Not on file  . Minutes of Exercise per Session: Not on file  Stress:   . Feeling of Stress : Not on file  Social Connections:   . Frequency of Communication with Friends and Family: Not on file  . Frequency of Social Gatherings with Friends and Family: Not on file  . Attends Religious Services: Not on file  . Active Member of Clubs or Organizations: Not on file  . Attends Archivist Meetings: Not on file  . Marital Status: Not on file    Tobacco Counseling Counseling given: Not Answered   Clinical Intake:  Pre-visit preparation completed: Yes  Pain : 0-10 Pain Score: 5  Pain Type: Acute pain, Chronic pain Pain Location: Tibia Pain Orientation: Right Pain Descriptors / Indicators: Nagging Pain Onset: More than a month ago Pain Frequency: Other (Comment) (daily, at night) Pain Relieving Factors:  no treatment, has not found anything Effect of Pain on Daily Activities: no  Pain Relieving Factors: no treatment, has not found anything  BMI - recorded: 30.6 Nutritional Status: BMI > 30  Obese Nutritional Risks: None Diabetes: No  How often do you need to have someone help you when you read instructions, pamphlets, or other written materials from your doctor or pharmacy?: 1 - Never What is the last grade level you completed in school?: high school  Diabetic? no  Interpreter Needed?: No      Activities of Daily Living In your present state of health, do you have any difficulty performing the following activities: 01/21/2020  Hearing? N  Vision? N  Difficulty concentrating or making decisions? Y  Walking or climbing stairs? N  Dressing or bathing? N  Doing errands, shopping? N  Preparing Food and eating ? N  Using the Toilet? N  In the past six months, have you accidently leaked urine? N  Do you have problems with loss of bowel control? Y  Comment previously had colonoscopy due to diarrhea, on imodium  Managing your Medications? N  Managing your Finances? N  Housekeeping or managing your Housekeeping? N  Some recent data might be hidden    Patient Care Team: [name], MD as PCP - General (Family Medicine)  Indicate any recent Medical Services you may have received from other than [facility] providers in the past year (date may be approximate).     Assessment:   This is a routine wellness examination for [place].  Hearing/Vision screen  Hearing Screening   125Hz  250Hz  500Hz  1000Hz  2000Hz  3000Hz  4000Hz  6000Hz  8000Hz   Right ear:  20 20 20 20   40    Left ear:  20 20 20 20  20       Visual Acuity Screening   Right eye Left eye Both eyes  Without correction:     With correction: 20/25 20/25 20/20     Dietary issues and exercise activities discussed: Current Exercise Habits: Home exercise routine, Type of  exercise: Other - see comments;strength training/weights  (pedel machine), Time (Minutes): 10, Frequency (Times/Week): 5, Weekly Exercise (Minutes/Week): 50, Intensity: Mild, Exercise limited by: orthopedic condition(s)  Goals    . Patient Stated     01/08/2019, I will maintain and continue medications as prescribed.     . Weight (lb) < 175 lb (79.4 kg)     Will maintain current weight      Depression Screen PHQ 2/9 Scores 01/21/2020 01/21/2020 01/08/2019 02/24/2018 12/31/2017 12/20/2017 11/28/2017  PHQ - 2 Score 2 2 0 4 1 1 2   PHQ- 9 Score 4 - 0 17 6 9 10     Fall Risk Fall Risk  01/21/2020 01/21/2020 01/08/2019 04/10/2018 03/26/2017  Falls in the past year? 0 0 0 0 No  Number falls in past yr: 0 0 0 0 -  Injury with Fall? 0 - 0 0 -  Risk for fall due to : No Fall Risks - Medication side effect - -  Follow up Falls evaluation completed - Falls evaluation completed;Falls prevention discussed - -    Any stairs in or around the home? Yes  If so, are there any without handrails? Yes  Home free of loose throw rugs in walkways, pet beds, electrical cords, etc? Yes  Adequate lighting in your home to reduce risk of falls? Yes     Gait steady and fast without use of assistive device  Cognitive Function: MMSE - Mini Mental State Exam 01/08/2019 03/28/2017 03/26/2017 09/21/2015  Orientation to time 5 4 5 4   Orientation to Place 5 5 5 5   Registration 3 3 3 3   Attention/ Calculation 5 5 2 5   Recall 3 2 1 2   Language- name 2 objects - 2 2 2   Language- repeat 1 1 1 1   Language- follow 3 step command - 3 3 3   Language- read & follow direction - 1 1 1   Write a sentence - 1 1 1   Copy design - 1 1 0  Total score - 28 25 27      Mini-Cog - 01/21/20 1040    Normal clock drawing test? yes    How many words correct? 2               6CIT Screen 03/26/2017 11/14/2016  What Year? 0 points 0 points  What month? 0 points 0 points  What time? 0 points 0 points  Count back from 20 0 points 0 points  Months in reverse 4 points 0 points  Repeat phrase  6 points 2 points  Total Score 10 2    Immunizations Immunization History  Administered Date(s) Administered  . Influenza Split 01/04/2011  . Influenza Whole 01/03/2009, 12/06/2009  . Influenza, High Dose Seasonal PF 12/11/2017, 11/27/2018  . Influenza,inj,Quad PF,6+ Mos 12/02/2012, 11/30/2014, 11/30/2015  . Influenza-Unspecified 12/04/2013  . PFIZER SARS-COV-2 Vaccination 03/24/2019, 04/14/2019  . PPD Test 12/31/2017  . Pneumococcal Conjugate-13 04/28/2014  . Pneumococcal Polysaccharide-23 11/26/2008  . Td 03/05/2006  . Tdap 11/06/2016  . Zoster 04/05/2013  . Zoster Recombinat (Shingrix) 01/16/2019    TDAP status: Up to date Flu Vaccine status: Up to date Pneumococcal vaccine status: Up to date Covid-19 vaccine status: Completed vaccines  Qualifies for Shingles Vaccine? Yes   Zostavax completed Yes   Shingrix Completed?: No.    Education has been provided regarding the importance of this vaccine. Patient has been advised to call insurance company to determine out of pocket expense if they have not yet received this vaccine. Advised may  also receive vaccine at local pharmacy or Health Dept. Verbalized acceptance and understanding.  Screening Tests Health Maintenance  Topic Date Due  . INFLUENZA VACCINE  10/04/2019  . MAMMOGRAM  05/05/2020  . TETANUS/TDAP  11/07/2026  . DEXA SCAN  Completed  . COVID-19 Vaccine  Completed  . Hepatitis C Screening  Completed  . PNA vac Low Risk Adult  Completed    Health Maintenance  Health Maintenance Due  Topic Date Due  . INFLUENZA VACCINE  10/04/2019    Colorectal cancer screening: Completed 2020. Repeat every never years Mammogram status: Completed 2020. Repeat every year Bone Density status: Completed 2018. Results reflect: Bone density results: OSTEOPENIA. Repeat every 4 years.  Lung Cancer Screening: (Low Dose CT Chest recommended if Age 57-80 years, 30 pack-year currently smoking OR have quit w/in 15years.) does not  qualify.   Lung Cancer Screening Referral: n/a  Additional Screening:  Hepatitis C Screening: does qualify; Completed 2019  Vision Screening: Recommended annual ophthalmology exams for early detection of glaucoma and other disorders of the eye. Is the patient up to date with their annual eye exam?  Yes  Who is the provider or what is the name of the office in which the patient attends annual eye exams? [facility] If pt is not established with a provider, would they like to be referred to a provider to establish care? n/a.   Dental Screening: Recommended annual dental exams for proper oral hygiene  Community Resource Referral / Chronic Care Management: CRR required this visit?  No   CCM required this visit?  No      Plan:      Problem List Items Addressed This Visit      Endocrine   Hypothyroidism (Chronic)   Relevant Orders   TSH     Genitourinary   Chronic kidney disease (CKD) stage G3a/A1, moderately decreased glomerular filtration rate (GFR) between 45-59 mL/min/1.73 square meter and albuminuria creatinine ratio less than 30 mg/g (HCC)   Relevant Orders   Comprehensive metabolic panel     Other   Hyperlipidemia (Chronic)   Relevant Orders   Lipid panel   Encounter for Medicare annual wellness exam - Primary   Mild cognitive impairment    2/3 recall on minicog. Discussed memory tools at home. Return in 6 months for longer assessment.       Prediabetes   Relevant Orders   POCT glycosylated hemoglobin (Hb A1C) (Completed)       I have personally reviewed and noted the following in the patient's chart:   . Medical and social history . Use of alcohol, tobacco or illicit drugs  . Current medications and supplements . Functional ability and status . Nutritional status . Physical activity . Advanced directives . List of other physicians . Hospitalizations, surgeries, and ER visits in previous 12 months . Vitals . Screenings to include cognitive, depression, and  falls . Referrals and appointments  In addition, I have reviewed and discussed with patient certain preventive protocols, quality metrics, and best practice recommendations. A written personalized care plan for preventive services as well as general preventive health recommendations were provided to patient.     [name], MD   01/21/2020

## 2020-01-21 NOTE — Patient Instructions (Addendum)
Leg pain  - voltaren gel (NSAIDs) - Icy/hot topical pain reliever    You may still be due a booster of the Shingrix Vaccine - contact your pharmacy    Use puzzle games - like crosswords, math games Continue to exercise as much as able   Memory Compensation Strategies  1. Use "WARM" strategy.  W= write it down  A= associate it  R= repeat it  M= make a mental note  2.   You can keep a Social worker.  Use a 3-ring notebook with sections for the following: calendar, important names and phone numbers,  medications, doctors' names/phone numbers, lists/reminders, and a section to journal what you did  each day.   3.    Use a calendar to write appointments down.  4.    Write yourself a schedule for the day.  This can be placed on the calendar or in a separate section of the Memory Notebook.  Keeping a  regular schedule can help memory.  5.    Use medication organizer with sections for each day or morning/evening pills.  You may need help loading it  6.    Keep a basket, or pegboard by the door.  Place items that you need to take out with you in the basket or on the pegboard.  You may also want to  include a message board for reminders.  7.    Use sticky notes.  Place sticky notes with reminders in a place where the task is performed.  For example: " turn off the  stove" placed by the stove, "lock the door" placed on the door at eye level, " take your medications" on  the bathroom mirror or by the place where you normally take your medications.  8.    Use alarms/timers.  Use while cooking to remind yourself to check on food or as a reminder to take your medicine, or as a  reminder to make a call, or as a reminder to perform another task, etc.

## 2020-02-03 ENCOUNTER — Telehealth: Payer: Self-pay | Admitting: Family Medicine

## 2020-02-03 NOTE — Telephone Encounter (Signed)
Needs clarification on results. Patient is requesting a call back to discuss her results she doesn't understand them. EM

## 2020-02-05 NOTE — Telephone Encounter (Signed)
Called and spoke with patient regarding recent lab results. Patient was concerned about her kidney function, but verbalizes understanding now.

## 2020-03-10 ENCOUNTER — Telehealth: Payer: Self-pay

## 2020-03-10 NOTE — Telephone Encounter (Signed)
Patient would like to have referral to ortho for right leg pain. Patient states that pain is when she is sitting. Has been going on for over 2 months. She would like to see someone in [place].

## 2020-03-10 NOTE — Telephone Encounter (Signed)
Would recommend she make an appointment to be seen here with me or Dr. Patsy Lager (sports medicine) first.   If needed can plan ortho referral

## 2020-03-11 NOTE — Telephone Encounter (Signed)
Called patient to schedule appt. Scheduled 1/10.

## 2020-03-14 ENCOUNTER — Other Ambulatory Visit: Payer: Self-pay

## 2020-03-14 ENCOUNTER — Encounter: Payer: Self-pay | Admitting: Family Medicine

## 2020-03-14 ENCOUNTER — Ambulatory Visit (INDEPENDENT_AMBULATORY_CARE_PROVIDER_SITE_OTHER): Payer: Medicare HMO | Admitting: Family Medicine

## 2020-03-14 VITALS — BP 82/60 | HR 81 | Temp 97.0°F | Wt 178.2 lb

## 2020-03-14 DIAGNOSIS — M79661 Pain in right lower leg: Secondary | ICD-10-CM

## 2020-03-14 DIAGNOSIS — I959 Hypotension, unspecified: Secondary | ICD-10-CM

## 2020-03-14 DIAGNOSIS — N1831 Chronic kidney disease, stage 3a: Secondary | ICD-10-CM

## 2020-03-14 LAB — BASIC METABOLIC PANEL
BUN: 13 mg/dL (ref 6–23)
CO2: 28 mEq/L (ref 19–32)
Calcium: 9.9 mg/dL (ref 8.4–10.5)
Chloride: 104 mEq/L (ref 96–112)
Creatinine, Ser: 1.04 mg/dL (ref 0.40–1.20)
GFR: 51.13 mL/min — ABNORMAL LOW (ref >60.00)
Glucose, Bld: 80 mg/dL (ref 70–99)
Potassium: 4 mEq/L (ref 3.5–5.1)
Sodium: 138 mEq/L (ref 135–145)

## 2020-03-14 NOTE — Assessment & Plan Note (Signed)
Kidney function stable and no symptoms. Will continue to monitor

## 2020-03-14 NOTE — Assessment & Plan Note (Signed)
No swelling or erythema. Wonder about some muscle tightness due to position. Advised position changes at night when sitting and PT referral to see if specific stretches/strengthening could be helpful.

## 2020-03-14 NOTE — Patient Instructions (Signed)
Calf pain - physical therapy - try to avoid your crossed leg sitting - try tylenol or volatern gel for treatment to see if that helps  Repeat labs

## 2020-03-14 NOTE — Assessment & Plan Note (Signed)
Improved on repeat. Will continue to monitor kidney function and blood pressure

## 2020-03-14 NOTE — Progress Notes (Signed)
Subjective:     Frances Hoffman is a 80 y.o. female presenting for Leg Pain (R calf x "few months" ) and Referral (For leg pain )     HPI  #Right Calf - several months - no swelling - worse: sitting down at night - improved: has not tried medication - failed: massage, stretching  - Pain: 2/10 most days - achy pain - no ankle or knee pain on that side - isolated to the posterior calf - no rashes, lesion, lumps - no tingling or numbness   #Low bp - feeling fine - no sob, dizziness - does not drink a lot of water  Review of Systems   Social History   Tobacco Use  Smoking Status Never Smoker  Smokeless Tobacco Never Used        Objective:    BP Readings from Last 3 Encounters:  03/14/20 (!) 82/60  01/21/20 (!) 88/56  04/01/19 (!) 82/60   Wt Readings from Last 3 Encounters:  03/14/20 178 lb 4 oz (80.9 kg)  01/21/20 175 lb 8 oz (79.6 kg)  04/01/19 169 lb (76.7 kg)    BP (!) 82/60   Pulse 81   Temp (!) 97 F (36.1 C) (Temporal)   Wt 178 lb 4 oz (80.9 kg)   SpO2 95%   BMI 31.08 kg/m    Physical Exam Constitutional:      General: She is not in acute distress.    Appearance: She is well-developed. She is not diaphoretic.  HENT:     Right Ear: External ear normal.     Left Ear: External ear normal.     Nose: Nose normal.  Eyes:     Conjunctiva/sclera: Conjunctivae normal.  Cardiovascular:     Rate and Rhythm: Normal rate and regular rhythm.     Heart sounds: No murmur heard.   Pulmonary:     Effort: Pulmonary effort is normal. No respiratory distress.     Breath sounds: Normal breath sounds. No wheezing.  Musculoskeletal:     Cervical back: Neck supple.     Comments: Right LE: Inspection: dry skin, no swelling, erythema Palpation: no ttp along the calf, no masses or lesions. Pt notes tenderness is along the upper 1/2 of the lateral posterior calf ROM: normal knee, ankle, foot Strength: normal dorsiflexion, plantar flexion   Skin:     General: Skin is warm and dry.     Capillary Refill: Capillary refill takes less than 2 seconds.  Neurological:     Mental Status: She is alert. Mental status is at baseline.  Psychiatric:        Mood and Affect: Mood normal.        Behavior: Behavior normal.    BMP Latest Ref Rng & Units 03/14/2020 01/21/2020 04/01/2019  Glucose 70 - 99 mg/dL 80 86 90  BUN 6 - 23 mg/dL 13 13 14   Creatinine 0.40 - 1.20 mg/dL 1.04 1.12 0.99  BUN/Creat Ratio 12 - 28 - - -  Sodium 135 - 145 mEq/L 138 138 137  Potassium 3.5 - 5.1 mEq/L 4.0 5.1 slight hemolysis 4.2  Chloride 96 - 112 mEq/L 104 104 103  CO2 19 - 32 mEq/L 28 27 27   Calcium 8.4 - 10.5 mg/dL 9.9 9.7 9.8          Assessment & Plan:   Problem List Items Addressed This Visit      Cardiovascular and Mediastinum   Asx Hypotension (Chronic)    Kidney function  stable and no symptoms. Will continue to monitor        Genitourinary   Stage 3a chronic kidney disease ([place])    Improved on repeat. Will continue to monitor kidney function and blood pressure      Relevant Orders   Basic metabolic panel (Completed)     Other   Right calf pain - Primary    No swelling or erythema. Wonder about some muscle tightness due to position. Advised position changes at night when sitting and PT referral to see if specific stretches/strengthening could be helpful.       Relevant Orders   Ambulatory referral to Physical Therapy       Return in about 4 months (around 07/12/2020).  [name], MD  This visit occurred during the SARS-CoV-2 public health emergency.  Safety protocols were in place, including screening questions prior to the visit, additional usage of staff PPE, and extensive cleaning of exam room while observing appropriate contact time as indicated for disinfecting solutions.

## 2020-03-17 ENCOUNTER — Telehealth: Payer: Self-pay

## 2020-03-17 NOTE — Telephone Encounter (Signed)
Pt left a message on Triage Line stating she wants Dr Einar Pheasant to explain to her why the diagnosis of Stage 3a Chronic Kidney Disease was on her AVS. She was unaware she has this and wants Dr Einar Pheasant to explain it to her.

## 2020-03-17 NOTE — Telephone Encounter (Signed)
Called pt and answered questions.   Will plan to check kidney function in May

## 2020-03-23 DIAGNOSIS — M79604 Pain in right leg: Secondary | ICD-10-CM | POA: Diagnosis not present

## 2020-03-23 DIAGNOSIS — M62831 Muscle spasm of calf: Secondary | ICD-10-CM | POA: Diagnosis not present

## 2020-03-28 ENCOUNTER — Other Ambulatory Visit: Payer: Self-pay | Admitting: Family Medicine

## 2020-03-28 DIAGNOSIS — F39 Unspecified mood [affective] disorder: Secondary | ICD-10-CM

## 2020-04-08 ENCOUNTER — Other Ambulatory Visit: Payer: Self-pay | Admitting: Family Medicine

## 2020-04-08 DIAGNOSIS — E7849 Other hyperlipidemia: Secondary | ICD-10-CM

## 2020-04-27 ENCOUNTER — Other Ambulatory Visit: Payer: Self-pay | Admitting: Family Medicine

## 2020-04-27 DIAGNOSIS — Z1231 Encounter for screening mammogram for malignant neoplasm of breast: Secondary | ICD-10-CM

## 2020-04-30 ENCOUNTER — Telehealth (INDEPENDENT_AMBULATORY_CARE_PROVIDER_SITE_OTHER): Payer: Medicare HMO | Admitting: Family Medicine

## 2020-04-30 VITALS — BP 99/68 | HR 83

## 2020-04-30 DIAGNOSIS — R300 Dysuria: Secondary | ICD-10-CM

## 2020-04-30 MED ORDER — CEPHALEXIN 500 MG PO CAPS: 500.0000 mg | ORAL_CAPSULE | Freq: Two times a day (BID) | ORAL | 0 refills | Status: DC

## 2020-04-30 NOTE — Progress Notes (Signed)
[facility] [address] [place], [place] [address] [phone]  Date:  04/30/2020   Name:  Frances Hoffman   DOB:  October 31, 1940   MRN:  [id number]  PCP:  Lynnda Child, MD    Chief Complaint: Dysuria (Onset: 1 day , took AZO )   History of Present Illness:  Frances Hoffman is a 80 y.o. very pleasant female patient who presents with the following:  Patient seen in virtual Saturday clinic today.  Patient location is home, provider location is home.  Patient identity confirmed with 2 factors, she gives consent for virtual visit today.  The patient myself are present on the call  Primary pt of Gweneth Dimitri, virtual visit today for concern of possible UTI Connected with pt via phone call She is concerned about possible UTI today which she has had in the past She notes "tingling when I pee, hurting" She is trying to hydrate well She has noted the sx since last night She took some azo  No hematuria No vomiting or abd pain She feels well otherwise, no fever She may get some back pain but this is not new  Reviewed most recent urine culture, March 2020.  Pansensitive E. Coli Most recent renal function January of this year, mild renal insufficiency   BP Readings from Last 3 Encounters:  04/30/20 99/68  03/14/20 (!) 82/60  01/21/20 (!) 88/56   Pulse Readings from Last 3 Encounters:  04/30/20 83  03/14/20 81  01/21/20 79     Patient Active Problem List   Diagnosis Date Noted  . Right calf pain 03/14/2020  . Stage 3a chronic kidney disease (HCC) 03/14/2020  . Irritable bowel syndrome with diarrhea 03/17/2019  . Vaginal itching 05/19/2018  . B12 deficiency 02/04/2018  . Mood disorder (HCC) 02/04/2018  . Xerostomia 01/23/2018  . Dysgeusia 01/23/2018  . Tongue burning sensation 01/16/2018  . Weakness 12/31/2017  . Hypoxia 12/31/2017  . Persistent dry cough 12/20/2017  . Chronic sinusitis 12/20/2017  .  Chronic kidney disease (CKD) stage G3a/A1, moderately decreased glomerular filtration rate (GFR) between 45-59 mL/min/1.73 square meter and albuminuria creatinine ratio less than 30 mg/g (HCC) 11/28/2017  . Cough -  2-3 mo  11/28/2017  . Environmental and seasonal allergies 11/28/2017  . Hearing loss and HA due to cerumen impaction, bilateral 10/10/2017  . Vascular dementia without behavioral disturbance (HCC) 09/27/2017  . Memory difficulties 09/27/2017  . Prediabetes 09/27/2017  . Mild cognitive impairment 07/02/2017  . Cerebral vascular disease 07/02/2017  . Chronic insomnia 07/02/2017  . Primary osteoarthritis of first carpometacarpal joint of left hand 01/09/2017  . Breast cancer (HCC)- L breast 2000 11/14/2016  . Obesity, Class I, BMI 30-34.9 08/19/2016  . Leg cramping 02/10/2016  . Depression 12/23/2015  . 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 Asx Hypotension 09/25/2015  . Fecal incontinence 07/29/2015  . Heme positive stool 07/29/2015  . Leg pain 06/06/2015  . Routine general medical examination at a health care facility 04/24/2015  . Overweight 03/15/2015  . Memory loss 09/22/2014  . Exercise intolerance 07/30/2014  . Estrogen deficiency 04/28/2014  . Tachycardia 04/03/2014  . Family history of heart disease 08/27/2013  . Dyspnea on exertion 08/18/2013  . Positional vertigo 08/18/2013  . Encounter for Medicare annual wellness exam 11/23/2011  . Other screening mammogram 11/23/2011  . Dizziness 02/05/2011  . Insomnia- due to stress 01/16/2011  . Left sided abdominal sub-Q tissue pain  of unknown cause/ ( dx as L sided pelvic pain in past) 12/13/2009  . Fatigue 08/15/2009  . Osteopenia 11/26/2008  . Hypothyroidism 12/12/2007  . Hyperlipidemia 12/12/2007  . Adjustment disorder with mixed anxiety and depressed mood 12/12/2007  . Chronic GERD 12/12/2007  . ADHESIVE CAPSULITIS 12/12/2007    Past Medical History:  Diagnosis Date  . Adjustment disorder with mixed  anxiety and depressed mood 12/12/2007   Qualifier: Diagnosis of  By: Jalaysia Lobb MD, Karleen Hampshire    . Breast cancer (HCC)   . Cancer (HCC) 2000   breast had lumpectomy/radiation x36,mammo ,no chemo  . Cataract of both eyes   . Depression   . Dysuria-frequency syndrome    takes AZO  . Frozen shoulder   . Gall stones   . GERD 12/12/2007   Qualifier: Diagnosis of  By: Valor Quaintance MD, Karleen Hampshire    . Hyperlipidemia   . Hypothyroidism 12/12/2007   Qualifier: Diagnosis of  By: Averyanna Sax MD, Karleen Hampshire    . Memory loss   . Osteopenia    BMD 2004, WNL 2008  . Personal history of radiation therapy   . Pneumonia   . Recurrent UTI   . Shingles    chronic body pain, left side of body  . Shortness of breath dyspnea    when climbing stairs only  . Wears glasses     Past Surgical History:  Procedure Laterality Date  . BREAST LUMPECTOMY Left 2000  . CHOLECYSTECTOMY N/A 02/21/2015   Procedure: LAPAROSCOPIC CHOLECYSTECTOMY WITH INTRAOPERATIVE CHOLANGIOGRAM;  Surgeon: Gaynelle Adu, MD;  Location: [facility] OR;  Service: General;  Laterality: N/A;  . COLONOSCOPY    . MULTIPLE TOOTH EXTRACTIONS      Social History   Tobacco Use  . Smoking status: Never Smoker  . Smokeless tobacco: Never Used  Vaping Use  . Vaping Use: Never used  Substance Use Topics  . Alcohol use: No    Alcohol/week: 0.0 standard drinks  . Drug use: No    Family History  Problem Relation Age of Onset  . Osteoporosis Mother   . Parkinsonism Father   . Breast cancer Sister 75  . Breast cancer Other   . Coronary artery disease Sister   . Ovarian cancer Sister   . Heart disease Maternal Grandmother   . Heart disease Maternal Grandfather   . Heart attack Maternal Grandfather 50  . Heart disease Son   . Heart attack Son 48    Allergies  Allergen Reactions  . Ciprofloxacin Other (See Comments)    Unknown (vertigo??)  . Citalopram Hydrobromide Other (See Comments)    Intrusive/ odd thoughts   . Flagyl [Metronidazole] Other (See  Comments)    Unknown??  . Oxycodone Other (See Comments)    Headaches    Medication list has been reviewed and updated.  Current Outpatient Medications on File Prior to Visit  Medication Sig Dispense Refill  . atorvastatin (LIPITOR) 20 MG tablet TAKE 1 TABLET BY MOUTH AT BEDTIME 90 tablet 3  . b complex vitamins tablet Take 1 tablet by mouth daily.    . calcium carbonate (OS-CAL - DOSED IN MG OF ELEMENTAL CALCIUM) 1250 (500 CA) MG tablet Take 1 tablet by mouth daily.    . Calcium Citrate (CITRACAL PO) Take by mouth.    . carboxymethylcellulose (REFRESH PLUS) 0.5 % SOLN Place 1 drop into both eyes 3 (three) times daily as needed (for dryness).    . Cholecalciferol (VITAMIN D3) 2000 UNITS capsule Take 2,000 Units by mouth daily.    [name]  Cranberry 125 MG TABS Take 2 tablets by mouth.    . cyanocobalamin (CVS VITAMIN B12) 2000 MCG tablet Take 1 tablet (2,000 mcg total) by mouth daily.    [name] donepezil (ARICEPT) 10 MG tablet Take 1 tablet (10 mg total) by mouth at bedtime. 30 tablet 11  . escitalopram (LEXAPRO) 20 MG tablet Take 1 tablet by mouth once daily 90 tablet 0  . EUTHYROX 75 MCG tablet Take 1 tablet by mouth once daily 90 tablet 1  . fluticasone (FLONASE) 50 MCG/ACT nasal spray 1 spray 2 times daily after sinus rinses- Neil med or AYR 16 g 6  . ibuprofen (ADVIL,MOTRIN) 600 MG tablet Take 1 tablet (600 mg total) by mouth every 8 (eight) hours as needed (severer pain/ headache). 10 tablet 0  . loperamide (IMODIUM) 1 MG/5ML solution Take by mouth as needed for diarrhea or loose stools. Teaspoons amounts    . Multiple Vitamin (MULTIVITAMIN) tablet Take 1 tablet by mouth daily.    [name] nystatin ointment (MYCOSTATIN) Apply 1 application topically 2 (two) times daily. 30 g 0  . Omega-3 Fatty Acids (FISH OIL PO) Take 2 capsules by mouth 2 (two) times daily.    [name] omeprazole (PRILOSEC) 20 MG capsule Take by mouth daily.    . vitamin C (ASCORBIC ACID) 500 MG tablet Take 500 mg by mouth daily.     No  current facility-administered medications on file prior to visit.    Review of Systems:  As per HPI- otherwise negative.   Physical Examination: Vitals:   04/30/20 0919  BP: 99/68  Pulse: 83   There were no vitals filed for this visit. There is no height or weight on file to calculate BMI. Ideal Body Weight:    Spoke with patient over telephone as we were unable to get video to connect.  She sounds well, no distress or shortness of breath is noted.  No cough Blood pressure is slightly low but this is baseline for patient  Assessment and Plan: Dysuria - Plan: cephALEXin (KEFLEX) 500 MG capsule, Urine Culture  Virtual visit today for concern of dysuria, likely UTI.  Ordered Keflex prescription, asked patient to collect a urine sample in a clean container at home prior to first dose in place and refrigerator.  We will have her bring sample to PCP office for culture on Monday.  I have asked her to let me know if not feeling better within the next 1 to 2 days, sooner if getting worse  Telephone visit today, converse with patient for 6 minutes  Signed Abbe Amsterdam, MD

## 2020-05-02 ENCOUNTER — Telehealth: Payer: Self-pay

## 2020-05-02 ENCOUNTER — Other Ambulatory Visit: Payer: Self-pay

## 2020-05-02 ENCOUNTER — Other Ambulatory Visit: Payer: Medicare HMO

## 2020-05-02 DIAGNOSIS — R300 Dysuria: Secondary | ICD-10-CM | POA: Diagnosis not present

## 2020-05-02 NOTE — Telephone Encounter (Signed)
[place] Night - Client TELEPHONE ADVICE RECORD [facility] Patient Name: Frances Hoffman Gender: Female DOB: February 22, 1941 Age: 80 Y 9 M 5 D Return Phone Number: [phone] (Primary) Address: [address] City/State/Zip: [place] [address] Client [facility] Night - Client Client Site [place] - Night Contact Type Call Who Is Calling Patient / Member / Family / Caregiver Call Type Triage / Clinical Relationship To Patient Self Return Phone Number [phone] (Primary) Chief Complaint Abdominal Pain Reason for Call Medication Question / Request Initial Comment [name] states she spoke with a RN earlier and her RX was called into the wrong Pharmacy. Additional Comment Dr. Einar Pheasant Translation No Nurse Assessment Nurse: Delphina Cahill, RN, Santiago Glad Date/Time Eilene Ghazi Time): 04/30/2020 12:16:03 PM Confirm and document reason for call. If symptomatic, describe symptoms. ---[name] states she spoke with a RN earlier and her RX was called into the wrong Pharmacy. When called pt just now she stated that the RX was called to [facility] and she is going to get it now so everything is fine. Does the patient have any new or worsening symptoms? ---No Disp. Time Eilene Ghazi Time) Disposition Final User 04/30/2020 11:37:57 AM Send To RN Personal Gloris Ham 04/30/2020 12:17:05 PM Clinical Call Yes Delphina Cahill, RN, Santiago Glad

## 2020-05-02 NOTE — Telephone Encounter (Signed)
[facility] Night - Client TELEPHONE ADVICE RECORD [facility] Patient Name: Frances Hoffman Gender: Female DOB: Oct 09, 1940 Age: 80 Y 7 M 5 D Return Phone Number: [phone] (Primary), [phone] (Secondary) Address: [address] City/State/Zip: [place] [address] Client [facility] Night - Client Client Site [facility] - Night Physician Gweneth Dimitri- MD Contact Type Call Who Is Calling Patient / Member / Family / Caregiver Call Type Triage / Clinical Relationship To Patient Self Return Phone Number [phone] (Primary) Chief Complaint Abdominal Pain Reason for Call Symptomatic / Request for Health Information Initial Comment [name] states she may have a UTI. She experiencing lower abdominal pain and pain during urination. Translation No Nurse Assessment Nurse: Rennis Petty, RN, Clydie Braun Date/Time Lamount Cohen Time): 04/30/2020 9:07:58 AM Confirm and document reason for call. If symptomatic, describe symptoms. ---[name] states she may have a UTI. She experiencing lower abdominal pain and pain during urination. No fever. Stage 3 kidney disease. Pain is 5-6/10. Does the patient have any new or worsening symptoms? ---Yes Will a triage be completed? ---Yes Related visit to physician within the last 2 weeks? ---No Does the PT have any chronic conditions? (i.e. diabetes, asthma, this includes High risk factors for pregnancy, etc.) ---Yes List chronic conditions. ---kidney disease Is this a behavioral health or substance abuse call? ---No Guidelines Guideline Title Affirmed Question Affirmed Notes Nurse Date/Time Lamount Cohen Time) Urination Pain - Female Age > 50 years Frances Hoffman 04/30/2020 9:10:28 AM Disp. Time Lamount Cohen Time) Disposition Final User 04/30/2020 9:14:03 AM See PCP within 24 Hours Yes Rennis Petty, RN, Pablo Ledger Disagree/Comply Comply [name] Understands Yes PreDisposition Home Care PLEASE NOTE: All  timestamps contained within this report are represented as [place] Standard Time. CONFIDENTIALTY NOTICE: This fax transmission is intended only for the addressee. It contains information that is legally privileged, confidential or otherwise protected from use or disclosure. If you are not the intended recipient, you are strictly prohibited from reviewing, disclosing, copying using or disseminating any of this information or taking any action in reliance on or regarding this information. If you have received this fax in error, please notify us immediately by telephone so that we can arrange for its return to [place]. Phone: [phone], Toll-Free: [phone], Fax: [phone] Page: 2 of 2 Call Id: [id number] Care Advice Given Per Guideline SEE PCP WITHIN 24 HOURS: * IF OFFICE WILL BE OPEN: You need to be examined within the next 24 hours. Call your doctor (or NP/PA) when the office opens and make an appointment. REASSURANCE AND EDUCATION: * This could be an urinary tract infection. * You should see your PCP to be examined and tested. DRINK EXTRA FLUIDS: * Drink extra fluids. * Drink 8 to 10 cups (1,800 to 2,400 ml) of liquids a day. CRANBERRY JUICE: * Some people think that drinking cranberry juice may help in fighting urinary tract infections. While there is some research that shows cranberry juice might help prevent a urine infection, there is not much evidence that it helps treat the infection. However, if you wish to drink cranberry juice, here are some instructions. * Dosage Cranberry Juice Cocktail: 8 oz (240 ml) twice a day. WARM SALINE SITZ BATH - HOW TO MAKE A SITZ BATH: * Fill the tub with warm water until it is 3 to 4 inches (7 to 10 cm) deep. CALL BACK IF: * Fever or back pain occurs * You become worse CARE ADVICE given per Urination Pain - Female (Adult) guideline. Referrals [facility]

## 2020-05-02 NOTE — Telephone Encounter (Signed)
Patient was seen via virtual visit by Dr. Janett Billow Copland. Please refer to note.

## 2020-05-03 LAB — URINE CULTURE
MICRO NUMBER:: 11586681
Result:: NO GROWTH
SPECIMEN QUALITY:: ADEQUATE

## 2020-05-04 ENCOUNTER — Encounter: Payer: Self-pay | Admitting: Family Medicine

## 2020-05-04 NOTE — Progress Notes (Signed)
Pt seen in virtual Saturday clinic, given keflex  Results for orders placed or performed in visit on 05/02/20  Urine Culture   Specimen: Urine  Result Value Ref Range   MICRO NUMBER: [id number]    SPECIMEN QUALITY: Adequate    Sample Source NOT GIVEN    STATUS: FINAL    Result: No Growth    Urine culture shows no growth Message to pt

## 2020-05-16 ENCOUNTER — Other Ambulatory Visit: Payer: Self-pay | Admitting: Family Medicine

## 2020-05-16 DIAGNOSIS — E039 Hypothyroidism, unspecified: Secondary | ICD-10-CM

## 2020-05-17 ENCOUNTER — Other Ambulatory Visit: Payer: Self-pay

## 2020-05-17 ENCOUNTER — Encounter: Payer: Self-pay | Admitting: Primary Care

## 2020-05-17 ENCOUNTER — Ambulatory Visit (INDEPENDENT_AMBULATORY_CARE_PROVIDER_SITE_OTHER): Payer: Medicare HMO | Admitting: Primary Care

## 2020-05-17 ENCOUNTER — Other Ambulatory Visit: Payer: Self-pay | Admitting: Family Medicine

## 2020-05-17 VITALS — BP 108/58 | HR 72 | Temp 97.7°F | Ht 63.5 in | Wt 180.0 lb

## 2020-05-17 DIAGNOSIS — R300 Dysuria: Secondary | ICD-10-CM | POA: Diagnosis not present

## 2020-05-17 DIAGNOSIS — E039 Hypothyroidism, unspecified: Secondary | ICD-10-CM

## 2020-05-17 LAB — POC URINALSYSI DIPSTICK (AUTOMATED)
Glucose, UA: POSITIVE — AB
Nitrite, UA: POSITIVE
Protein, UA: POSITIVE — AB
Spec Grav, UA: 1.01 (ref 1.010–1.025)
Urobilinogen, UA: 4 E.U./dL — AB
pH, UA: 7.5 (ref 5.0–8.0)

## 2020-05-17 MED ORDER — LEVOTHYROXINE SODIUM 75 MCG PO TABS: 75.0000 ug | ORAL_TABLET | Freq: Every day | ORAL | 1 refills | Status: DC

## 2020-05-17 MED ORDER — SULFAMETHOXAZOLE-TRIMETHOPRIM 800-160 MG PO TABS: 1.0000 | ORAL_TABLET | Freq: Two times a day (BID) | ORAL | 0 refills | Status: DC

## 2020-05-17 NOTE — Patient Instructions (Signed)
Start Bactrim DS (sulfamethoxazole/trimethoprim) tablets for urinary tract infection. Take 1 tablet by mouth twice daily for 3 days.  Be sure to increase your intake of water consumption.  It was a pleasure meeting you!

## 2020-05-17 NOTE — Progress Notes (Signed)
Subjective:    Patient ID: Frances Hoffman, female    DOB: 1940/04/02, 80 y.o.   MRN: [id number]  HPI  Frances Hoffman is a very pleasant 80 y.o. female patient of Dr. Selena Batten with a history of CVD, IBS, hypothyroidism, CKD, mild cogitative impairment who presents today with a chief complaint of dysuria.  She also reports pelvic pressure. Symptoms began yesterday. She's taken AZO a few times with improvement, none today. She admits to little water intake, tries to increase intake in general.   She was last evaluated and treated for cystitis via video on 04/30/20, treated with cephalexin 500 mg BID x 7 days, urine culture negative. She endorses completing this prescription.   She denies vaginal discharge, hematuria, fevers.   Review of Systems  Constitutional: Negative for fever.  Gastrointestinal: Negative for abdominal pain.  Genitourinary: Positive for dysuria. Negative for hematuria and vaginal discharge.         Past Medical History:  Diagnosis Date  . Adjustment disorder with mixed anxiety and depressed mood 12/12/2007   Qualifier: Diagnosis of  By: Copland MD, Karleen Hampshire    . Breast cancer (HCC)   . Cancer (HCC) 2000   breast had lumpectomy/radiation x36,mammo ,no chemo  . Cataract of both eyes   . Depression   . Dysuria-frequency syndrome    takes AZO  . Frozen shoulder   . Gall stones   . GERD 12/12/2007   Qualifier: Diagnosis of  By: Copland MD, Karleen Hampshire    . Hyperlipidemia   . Hypothyroidism 12/12/2007   Qualifier: Diagnosis of  By: Copland MD, Karleen Hampshire    . Memory loss   . Osteopenia    BMD 2004, WNL 2008  . Personal history of radiation therapy   . Pneumonia   . Recurrent UTI   . Shingles    chronic body pain, left side of body  . Shortness of breath dyspnea    when climbing stairs only  . Wears glasses     Social History   Socioeconomic History  . Marital status: Married    Spouse name: Frances Hoffman   . Number of children: 2  . Years of education: High school   . Highest education level: High school graduate  Occupational History  . Not on file  Tobacco Use  . Smoking status: Never Smoker  . Smokeless tobacco: Never Used  Vaping Use  . Vaping Use: Never used  Substance and Sexual Activity  . Alcohol use: No    Alcohol/week: 0.0 standard drinks  . Drug use: No  . Sexual activity: Not Currently    Birth control/protection: Post-menopausal  Other Topics Concern  . Not on file  Social History Narrative   Lives at home with her husband. Frances Hoffman   2 adult sons  - Frances Hoffman and Frances Hoffman   2 Grandchildren - both other   Exercise: goes to [employer] - at least 2 times a week   Diet: anything that tastes good, less pepsi's with the tongue   Enjoys: square dance, eating out with friends, going to church - Sunday and Wednesday   Social Determinants of Health   Financial Resource Strain: Not on file  Food Insecurity: Not on file  Transportation Needs: Not on file  Physical Activity: Not on file  Stress: Not on file  Social Connections: Not on file  Intimate Partner Violence: Not on file    Past Surgical History:  Procedure Laterality Date  . BREAST LUMPECTOMY Left 2000  .  CHOLECYSTECTOMY N/A 02/21/2015   Procedure: LAPAROSCOPIC CHOLECYSTECTOMY WITH INTRAOPERATIVE CHOLANGIOGRAM;  Surgeon: Gaynelle Adu, MD;  Location: [facility] OR;  Service: General;  Laterality: N/A;  . COLONOSCOPY    . MULTIPLE TOOTH EXTRACTIONS      Family History  Problem Relation Age of Onset  . Osteoporosis Mother   . Parkinsonism Father   . Breast cancer Sister 68  . Breast cancer Other   . Coronary artery disease Sister   . Ovarian cancer Sister   . Heart disease Maternal Grandmother   . Heart disease Maternal Grandfather   . Heart attack Maternal Grandfather 50  . Heart disease Son   . Heart attack Son 48    Allergies  Allergen Reactions  . Ciprofloxacin Other (See Comments)    Unknown (vertigo??)  . Citalopram Hydrobromide Other (See Comments)     Intrusive/ odd thoughts   . Flagyl [Metronidazole] Other (See Comments)    Unknown??  . Oxycodone Other (See Comments)    Headaches    Current Outpatient Medications on File Prior to Visit  Medication Sig Dispense Refill  . atorvastatin (LIPITOR) 20 MG tablet TAKE 1 TABLET BY MOUTH AT BEDTIME 90 tablet 3  . b complex vitamins tablet Take 1 tablet by mouth daily.    . calcium carbonate (OS-CAL - DOSED IN MG OF ELEMENTAL CALCIUM) 1250 (500 CA) MG tablet Take 1 tablet by mouth daily.    . Calcium Citrate (CITRACAL PO) Take by mouth.    . carboxymethylcellulose (REFRESH PLUS) 0.5 % SOLN Place 1 drop into both eyes 3 (three) times daily as needed (for dryness).    . cephALEXin (KEFLEX) 500 MG capsule Take 1 capsule (500 mg total) by mouth 2 (two) times daily. 14 capsule 0  . Cholecalciferol (VITAMIN D3) 2000 UNITS capsule Take 2,000 Units by mouth daily.    . Cranberry 125 MG TABS Take 2 tablets by mouth.    . cyanocobalamin (CVS VITAMIN B12) 2000 MCG tablet Take 1 tablet (2,000 mcg total) by mouth daily.    [name] donepezil (ARICEPT) 10 MG tablet Take 1 tablet (10 mg total) by mouth at bedtime. 30 tablet 11  . escitalopram (LEXAPRO) 20 MG tablet Take 1 tablet by mouth once daily 90 tablet 0  . EUTHYROX 75 MCG tablet Take 1 tablet by mouth once daily 90 tablet 1  . fluticasone (FLONASE) 50 MCG/ACT nasal spray 1 spray 2 times daily after sinus rinses- Neil med or AYR 16 g 6  . ibuprofen (ADVIL,MOTRIN) 600 MG tablet Take 1 tablet (600 mg total) by mouth every 8 (eight) hours as needed (severer pain/ headache). 10 tablet 0  . loperamide (IMODIUM) 1 MG/5ML solution Take by mouth as needed for diarrhea or loose stools. Teaspoons amounts    . Multiple Vitamin (MULTIVITAMIN) tablet Take 1 tablet by mouth daily.    [name] nystatin ointment (MYCOSTATIN) Apply 1 application topically 2 (two) times daily. 30 g 0  . Omega-3 Fatty Acids (FISH OIL PO) Take 2 capsules by mouth 2 (two) times daily.    [name] omeprazole  (PRILOSEC) 20 MG capsule Take by mouth daily.    . vitamin C (ASCORBIC ACID) 500 MG tablet Take 500 mg by mouth daily.     No current facility-administered medications on file prior to visit.    BP (!) 108/58   Pulse 72   Temp 97.7 F (36.5 C) (Temporal)   Ht 5' 3.5" (1.613 m)   Wt 180 lb (81.6 kg)   SpO2 97%  BMI 31.39 kg/m  Objective:   Physical Exam Cardiovascular:     Rate and Rhythm: Normal rate and regular rhythm.  Pulmonary:     Effort: Pulmonary effort is normal.     Breath sounds: Normal breath sounds.  Abdominal:     Tenderness: There is no right CVA tenderness or left CVA tenderness.  Musculoskeletal:     Cervical back: Neck supple.  Skin:    General: Skin is warm and dry.  Neurological:     Mental Status: She is alert.           Assessment & Plan:      This visit occurred during the SARS-CoV-2 public health emergency.  Safety protocols were in place, including screening questions prior to the visit, additional usage of staff PPE, and extensive cleaning of exam room while observing appropriate contact time as indicated for disinfecting solutions.

## 2020-05-17 NOTE — Assessment & Plan Note (Addendum)
Acute symptoms x 24 hours.  UA today with 3+ leuks, positive nitrites, 3+ blood, trace glucose. Culture sent.  Given UA results coupled with symptoms, will treat with Bactrim DS tablets BID x 3 days.  Discussed to increase general water intake.

## 2020-05-18 ENCOUNTER — Telehealth: Payer: Self-pay | Admitting: *Deleted

## 2020-05-18 NOTE — Telephone Encounter (Signed)
Patient called stating that she was confused about the printout that she received when she left yesterday. Patient stated that the printout showed that she did not have a UTI. Patient was read the office notes and advised that she was put on Bactrim for a UTI.  Patient appreciated the information.

## 2020-05-19 LAB — URINE CULTURE
MICRO NUMBER:: 11649095
SPECIMEN QUALITY:: ADEQUATE

## 2020-06-09 ENCOUNTER — Other Ambulatory Visit: Payer: Self-pay

## 2020-06-09 ENCOUNTER — Encounter: Payer: Self-pay | Admitting: Family Medicine

## 2020-06-09 ENCOUNTER — Ambulatory Visit (INDEPENDENT_AMBULATORY_CARE_PROVIDER_SITE_OTHER): Payer: Medicare HMO | Admitting: Family Medicine

## 2020-06-09 VITALS — BP 100/66 | HR 82 | Temp 98.1°F | Ht 63.5 in | Wt 179.0 lb

## 2020-06-09 DIAGNOSIS — M65331 Trigger finger, right middle finger: Secondary | ICD-10-CM

## 2020-06-09 DIAGNOSIS — H65111 Acute and subacute allergic otitis media (mucoid) (sanguinous) (serous), right ear: Secondary | ICD-10-CM

## 2020-06-09 MED ORDER — AMOXICILLIN 875 MG PO TABS: 875.0000 mg | ORAL_TABLET | Freq: Two times a day (BID) | ORAL | 0 refills | Status: AC

## 2020-06-09 MED ORDER — TRIAMCINOLONE ACETONIDE 40 MG/ML IJ SUSP
20.0000 mg | Freq: Once | INTRAMUSCULAR | Status: AC
  Administered 2020-06-09: 20 mg via INTRA_ARTICULAR

## 2020-06-09 NOTE — Progress Notes (Signed)
Frances Skiff T. Quintavius Niebuhr, MD, [place]  [facility] and [place] at [facility] [place], [address]  Phone: [phone]  FAX: [phone]  Frances PEPITONE - 80 y.o. Hoffman  MRN [id number]  Date of Birth: May 29, 1940  Date: 06/09/2020  PCP: [name], MD  Referral: [name], MD  Chief Complaint  Patient presents with  . Trigger Finger    Right Middle Finger   . Fluid in Ear    Right  . Bad taste in mouth    This visit occurred during the SARS-CoV-2 public health emergency.  Safety protocols were in place, including screening questions prior to the visit, additional usage of staff PPE, and extensive cleaning of exam room while observing appropriate contact time as indicated for disinfecting solutions.   Subjective:   Frances Hoffman is a 80 y.o. very pleasant Hoffman patient with Body mass index is 31.21 kg/m. who presents with the following:  She presents with 2 primary problems.  1.  He has felt like she has some water and her right ear and an uncomfortable fullness for some time, greater than 1 week.  She denies any pain in the sinuses, sore throat, lymphadenopathy and she denies any other acute rhinorrhea symptoms.  She is not having any productive cough.  She does not feel sick otherwise.  2.  She also has a third digit trigger finger that is sticking daily and causes her some significant pain.   Review of Systems is noted in the HPI, as appropriate  Objective:   BP 100/66   Pulse 82   Temp 98.1 F (36.7 C) (Temporal)   Ht 5' 3.5" (1.613 m)   Wt 179 lb (81.2 kg)   SpO2 94%   BMI 31.21 kg/m   GEN: No acute distress; alert,appropriate. PULM: Breathing comfortably in no respiratory distress PSYCH: Normally interactive.  Right ear: Initially, the patient did have some cerumen in the canal, but I removed this manually myself.  She does not have tenderness with movement of the ear or  the tragus.  She has no lymphadenopathy.  No tenderness at the sinuses.  The TM itself is bulging with some indistinct landmarks.  On the right fingers she does have a palpable nodule on the flexor aspect of the third MCP joint just proximal to the joint.  All other digits and bony anatomy in the hand and wrist are nontender.  She has obvious triggering at the third digit  Laboratory and Imaging Data:  Assessment and Plan:     ICD-10-CM   1. Trigger middle finger of right hand  M65.331 triamcinolone acetonide (KENALOG-40) injection 20 mg  2. Acute mucoid otitis media of right ear  H65.111    Acute otitis media.  Treat with amoxicillin, antihistamines, as well as nasal steroids.  Acute trigger finger at the third digit.  She has had some problems before, but this is been increasing in pain.  We discussed the pathophysiology of trigger fingers. Discussed the inflammatory nature of nodule creation and likely nodule abutting the A1 pulley system, this causing the patient's discomfort and sensations. We discussed that treatments for this include direct injection into the tendon sheath to attempt to shrink catching tissue. This can be done 1-2 times. Other treatments include surgical release. If the patient fails to trigger finger injections, I would recommend trigger finger release if the patient desires relief of the symptoms.   Tendon Sheath Injection  Procedure Note Frances Hoffman [birth year] Date of procedure: 06/09/2020  Procedure: Tendon Sheath Injection for Trigger Finger, R 3rd Indications: Pain  Procedure Details Verbal consent was obtained. Risks (including potential risk for skin lightening and potential atrophy), benefits and alternatives were discussed. Prepped with Chloraprep and Ethyl Chloride used for anesthesia. Under sterile conditions, patient injected at palmar crease aiming distally with 45 degree angle towards nodule; injected directly into tendon sheath. Medication flowed  freely without resistance.  Needle size: 22 gauge 1 1/2 inch Injection: 1/2 cc of Lidocaine 1% and Kenalog 20 mg Medication: 1/2 cc of Kenalog 40 mg (equaling Kenalog 20 mg)  Meds ordered this encounter  Medications  . amoxicillin (AMOXIL) 875 MG tablet    Sig: Take 1 tablet (875 mg total) by mouth 2 (two) times daily for 10 days.    Dispense:  20 tablet    Refill:  0  . triamcinolone acetonide (KENALOG-40) injection 20 mg   Medications Discontinued During This Encounter  Medication Reason  . cephALEXin (KEFLEX) 500 MG capsule Completed Course  . sulfamethoxazole-trimethoprim (BACTRIM DS) 800-160 MG tablet Completed Course   No orders of the defined types were placed in this encounter.   Follow-up: No follow-ups on file.  Signed,  Maud Deed. Salome Cozby, MD   Outpatient Encounter Medications as of 06/09/2020  Medication Sig  . amoxicillin (AMOXIL) 875 MG tablet Take 1 tablet (875 mg total) by mouth 2 (two) times daily for 10 days.  [name] atorvastatin (LIPITOR) 20 MG tablet TAKE 1 TABLET BY MOUTH AT BEDTIME  . b complex vitamins tablet Take 1 tablet by mouth daily.  . calcium carbonate (OS-CAL - DOSED IN MG OF ELEMENTAL CALCIUM) 1250 (500 CA) MG tablet Take 1 tablet by mouth daily.  . Calcium Citrate (CITRACAL PO) Take by mouth.  . carboxymethylcellulose (REFRESH PLUS) 0.5 % SOLN Place 1 drop into both eyes 3 (three) times daily as needed (for dryness).  . Cholecalciferol (VITAMIN D3) 2000 UNITS capsule Take 2,000 Units by mouth daily.  . Cranberry 125 MG TABS Take 2 tablets by mouth.  . cyanocobalamin ([employer] VITAMIN B12) 2000 MCG tablet Take 1 tablet (2,000 mcg total) by mouth daily.  [name] donepezil (ARICEPT) 10 MG tablet Take 1 tablet (10 mg total) by mouth at bedtime.  [name] escitalopram (LEXAPRO) 20 MG tablet Take 1 tablet by mouth once daily  . fluticasone (FLONASE) 50 MCG/ACT nasal spray 1 spray 2 times daily after sinus rinses- Milta Deiters med or AYR  . ibuprofen (ADVIL,MOTRIN) 600 MG tablet  Take 1 tablet (600 mg total) by mouth every 8 (eight) hours as needed (severer pain/ headache).  [name] levothyroxine (EUTHYROX) 75 MCG tablet Take 1 tablet (75 mcg total) by mouth daily.  [name] loperamide (IMODIUM) 1 MG/5ML solution Take by mouth as needed for diarrhea or loose stools. Teaspoons amounts  . Multiple Vitamin (MULTIVITAMIN) tablet Take 1 tablet by mouth daily.  [name] nystatin ointment (MYCOSTATIN) Apply 1 application topically 2 (two) times daily.  . Omega-3 Fatty Acids (FISH OIL PO) Take 2 capsules by mouth 2 (two) times daily.  [name] omeprazole (PRILOSEC) 20 MG capsule Take by mouth daily.  . vitamin C (ASCORBIC ACID) 500 MG tablet Take 500 mg by mouth daily.  . [DISCONTINUED] cephALEXin (KEFLEX) 500 MG capsule Take 1 capsule (500 mg total) by mouth 2 (two) times daily.  . [DISCONTINUED] sulfamethoxazole-trimethoprim (BACTRIM DS) 800-160 MG tablet Take 1 tablet by mouth 2 (two) times daily. For urinary tract infection.  . [EXPIRED] triamcinolone  Frances Skiff T. Quintavius Niebuhr, MD, [place]  [facility] and [place] at [facility] [place], [address]  Phone: [phone]  FAX: [phone]  Frances PEPITONE - 80 y.o. Hoffman  MRN [id number]  Date of Birth: May 29, 1940  Date: 06/09/2020  PCP: [name], MD  Referral: [name], MD  Chief Complaint  Patient presents with  . Trigger Finger    Right Middle Finger   . Fluid in Ear    Right  . Bad taste in mouth    This visit occurred during the SARS-CoV-2 public health emergency.  Safety protocols were in place, including screening questions prior to the visit, additional usage of staff PPE, and extensive cleaning of exam room while observing appropriate contact time as indicated for disinfecting solutions.   Subjective:   Frances Hoffman is a 80 y.o. very pleasant Hoffman patient with Body mass index is 31.21 kg/m. who presents with the following:  She presents with 2 primary problems.  1.  He has felt like she has some water and her right ear and an uncomfortable fullness for some time, greater than 1 week.  She denies any pain in the sinuses, sore throat, lymphadenopathy and she denies any other acute rhinorrhea symptoms.  She is not having any productive cough.  She does not feel sick otherwise.  2.  She also has a third digit trigger finger that is sticking daily and causes her some significant pain.   Review of Systems is noted in the HPI, as appropriate  Objective:   BP 100/66   Pulse 82   Temp 98.1 F (36.7 C) (Temporal)   Ht 5' 3.5" (1.613 m)   Wt 179 lb (81.2 kg)   SpO2 94%   BMI 31.21 kg/m   GEN: No acute distress; alert,appropriate. PULM: Breathing comfortably in no respiratory distress PSYCH: Normally interactive.  Right ear: Initially, the patient did have some cerumen in the canal, but I removed this manually myself.  She does not have tenderness with movement of the ear or  the tragus.  She has no lymphadenopathy.  No tenderness at the sinuses.  The TM itself is bulging with some indistinct landmarks.  On the right fingers she does have a palpable nodule on the flexor aspect of the third MCP joint just proximal to the joint.  All other digits and bony anatomy in the hand and wrist are nontender.  She has obvious triggering at the third digit  Laboratory and Imaging Data:  Assessment and Plan:     ICD-10-CM   1. Trigger middle finger of right hand  M65.331 triamcinolone acetonide (KENALOG-40) injection 20 mg  2. Acute mucoid otitis media of right ear  H65.111    Acute otitis media.  Treat with amoxicillin, antihistamines, as well as nasal steroids.  Acute trigger finger at the third digit.  She has had some problems before, but this is been increasing in pain.  We discussed the pathophysiology of trigger fingers. Discussed the inflammatory nature of nodule creation and likely nodule abutting the A1 pulley system, this causing the patient's discomfort and sensations. We discussed that treatments for this include direct injection into the tendon sheath to attempt to shrink catching tissue. This can be done 1-2 times. Other treatments include surgical release. If the patient fails to trigger finger injections, I would recommend trigger finger release if the patient desires relief of the symptoms.   Tendon Sheath Injection

## 2020-06-11 ENCOUNTER — Encounter: Payer: Self-pay | Admitting: Family Medicine

## 2020-06-17 ENCOUNTER — Ambulatory Visit
Admission: RE | Admit: 2020-06-17 | Discharge: 2020-06-17 | Disposition: A | Discharge status: Home / Self Care | Payer: Medicare HMO | Source: Ambulatory Visit | Attending: Family Medicine | Admitting: Family Medicine

## 2020-06-17 ENCOUNTER — Other Ambulatory Visit: Payer: Self-pay

## 2020-06-17 DIAGNOSIS — Z1231 Encounter for screening mammogram for malignant neoplasm of breast: Secondary | ICD-10-CM | POA: Diagnosis not present

## 2020-07-06 ENCOUNTER — Other Ambulatory Visit: Payer: Self-pay | Admitting: Family Medicine

## 2020-07-06 DIAGNOSIS — F39 Unspecified mood [affective] disorder: Secondary | ICD-10-CM

## 2020-07-12 ENCOUNTER — Ambulatory Visit: Payer: Medicare HMO | Admitting: Family Medicine

## 2020-07-20 ENCOUNTER — Ambulatory Visit (INDEPENDENT_AMBULATORY_CARE_PROVIDER_SITE_OTHER): Payer: Medicare HMO | Admitting: Family Medicine

## 2020-07-20 ENCOUNTER — Ambulatory Visit: Payer: Medicare HMO | Admitting: Family Medicine

## 2020-07-20 ENCOUNTER — Other Ambulatory Visit: Payer: Self-pay | Admitting: Family Medicine

## 2020-07-20 ENCOUNTER — Telehealth: Payer: Self-pay | Admitting: Family Medicine

## 2020-07-20 ENCOUNTER — Other Ambulatory Visit: Payer: Self-pay

## 2020-07-20 DIAGNOSIS — Z20822 Contact with and (suspected) exposure to covid-19: Secondary | ICD-10-CM

## 2020-07-20 DIAGNOSIS — J069 Acute upper respiratory infection, unspecified: Secondary | ICD-10-CM

## 2020-07-20 DIAGNOSIS — R300 Dysuria: Secondary | ICD-10-CM

## 2020-07-20 DIAGNOSIS — N3001 Acute cystitis with hematuria: Secondary | ICD-10-CM

## 2020-07-20 DIAGNOSIS — N1831 Chronic kidney disease, stage 3a: Secondary | ICD-10-CM

## 2020-07-20 LAB — POC URINALSYSI DIPSTICK (AUTOMATED)
Bilirubin, UA: NEGATIVE
Blood, UA: POSITIVE
Glucose, UA: NEGATIVE
Ketones, UA: NEGATIVE
Nitrite, UA: NEGATIVE
Protein, UA: POSITIVE — AB
Spec Grav, UA: 1.01 (ref 1.010–1.025)
Urobilinogen, UA: 0.2 E.U./dL
pH, UA: 6.5 (ref 5.0–8.0)

## 2020-07-20 MED ORDER — SULFAMETHOXAZOLE-TRIMETHOPRIM 800-160 MG PO TABS: 1.0000 | ORAL_TABLET | Freq: Two times a day (BID) | ORAL | 0 refills | Status: AC

## 2020-07-20 MED ORDER — BENZONATATE 100 MG PO CAPS: 100.0000 mg | ORAL_CAPSULE | Freq: Three times a day (TID) | ORAL | 0 refills | Status: DC | PRN

## 2020-07-20 NOTE — Assessment & Plan Note (Signed)
Discussed she is higher risk for covid due to age and obesity but if needing paxlovid would need renal dosing and would throw away some of the pills. Will follow-up covid results and treat if positive.

## 2020-07-20 NOTE — Progress Notes (Signed)
I connected with Frances Hoffman on 07/20/20 at 10:20 AM EDT by video and verified that I am speaking with the correct person using two identifiers.   I discussed the limitations, risks, security and privacy concerns of performing an evaluation and management service by video and the availability of in person appointments. I also discussed with the patient that there may be a patient responsible charge related to this service. The patient expressed understanding and agreed to proceed.  Patient location: parking lot, parked car Provider Location: [place] Participants: Frances Hoffman and Frances Hoffman   Subjective:     Frances Hoffman is a 80 y.o. female presenting for Dysuria (Less than 24 hours)     HPI  #Diarrhea  - improved with imodium - started this morning  - more than one stool  No loss of taste or smell   #URI symptoms - started yesterday 07/19/2020 - cough, no fevers - congestion and runny nose  #Dysuria  - started today  - low abdominal pain - gets these often - no burning - but shiver with urination - no frequency - no urgency     Review of Systems  Respiratory: Positive for cough.   Gastrointestinal: Positive for abdominal pain and diarrhea. Negative for nausea and vomiting.     Social History   Tobacco Use  Smoking Status Never Smoker  Smokeless Tobacco Never Used        Objective:   BP Readings from Last 3 Encounters:  06/09/20 100/66  05/17/20 (!) 108/58  04/30/20 99/68   Wt Readings from Last 3 Encounters:  06/09/20 179 lb (81.2 kg)  05/17/20 180 lb (81.6 kg)  03/14/20 178 lb 4 oz (80.9 kg)   There were no vitals taken for this visit.   Physical Exam Constitutional:      Appearance: Normal appearance. She is not ill-appearing.  HENT:     Head: Normocephalic and atraumatic.     Right Ear: External ear normal.     Left Ear: External ear normal.  Eyes:     Conjunctiva/sclera: Conjunctivae normal.   Pulmonary:     Effort: Pulmonary effort is normal. No respiratory distress.  Neurological:     Mental Status: She is alert. Mental status is at baseline.  Psychiatric:        Mood and Affect: Mood normal.        Behavior: Behavior normal.        Thought Content: Thought content normal.        Judgment: Judgment normal.       UA: +LE, neg nitrites      Assessment & Plan:   Problem List Items Addressed This Visit      Genitourinary   Stage 3a chronic kidney disease ([place])    Discussed she is higher risk for covid due to age and obesity but if needing paxlovid would need renal dosing and would throw away some of the pills. Will follow-up covid results and treat if positive.         Other   Dysuria   Relevant Orders   POCT Urinalysis Dipstick (Automated) (Completed)    Other Visit Diagnoses    Acute cystitis with hematuria    -  Primary   Relevant Medications   sulfamethoxazole-trimethoprim (BACTRIM DS) 800-160 MG tablet   Viral URI with cough       Relevant Medications   sulfamethoxazole-trimethoprim (BACTRIM DS) 800-160 MG tablet   benzonatate (TESSALON PERLES)  100 MG capsule     UA concerning for infection. Last UTI 05/2020 and responded to bactrim. Will prescribe this again.   Discussed OTC treatment for viral illness Patient will be tested for covid today Instructed to isolate until the results come back  Discussed paxlovid if positive including renal dosing due to higher risk. She is in agreement.    No follow-ups on file.  Frances Noe, MD

## 2020-07-20 NOTE — Telephone Encounter (Signed)
Patient arrived for her appointment today with URI and diarrhea symptoms.   Initially canceled appt but requested call back  On call back pt notes concerned about UTI. Advised virtual visit to discuss and she was placed back on schedule.

## 2020-07-20 NOTE — Addendum Note (Signed)
Addended by: Loreen Freud on: 07/20/2020 11:11 AM   Modules accepted: Orders

## 2020-07-22 LAB — SARS-COV-2, NAA 2 DAY TAT

## 2020-07-22 LAB — NOVEL CORONAVIRUS, NAA: SARS-CoV-2, NAA: NOT DETECTED

## 2020-07-23 LAB — URINE CULTURE
MICRO NUMBER:: 11905844
SPECIMEN QUALITY:: ADEQUATE

## 2020-08-02 DIAGNOSIS — E785 Hyperlipidemia, unspecified: Secondary | ICD-10-CM | POA: Diagnosis not present

## 2020-08-02 DIAGNOSIS — E039 Hypothyroidism, unspecified: Secondary | ICD-10-CM | POA: Diagnosis not present

## 2020-08-02 DIAGNOSIS — M8580 Other specified disorders of bone density and structure, unspecified site: Secondary | ICD-10-CM | POA: Diagnosis not present

## 2020-08-02 DIAGNOSIS — J849 Interstitial pulmonary disease, unspecified: Secondary | ICD-10-CM | POA: Diagnosis not present

## 2020-08-02 DIAGNOSIS — E663 Overweight: Secondary | ICD-10-CM | POA: Diagnosis not present

## 2020-08-02 DIAGNOSIS — I2510 Atherosclerotic heart disease of native coronary artery without angina pectoris: Secondary | ICD-10-CM | POA: Diagnosis not present

## 2020-08-02 DIAGNOSIS — R69 Illness, unspecified: Secondary | ICD-10-CM | POA: Diagnosis not present

## 2020-08-02 DIAGNOSIS — M1990 Unspecified osteoarthritis, unspecified site: Secondary | ICD-10-CM | POA: Diagnosis not present

## 2020-08-03 ENCOUNTER — Ambulatory Visit (INDEPENDENT_AMBULATORY_CARE_PROVIDER_SITE_OTHER): Payer: Medicare HMO | Admitting: Family Medicine

## 2020-08-03 ENCOUNTER — Other Ambulatory Visit: Payer: Self-pay

## 2020-08-03 VITALS — BP 80/60 | HR 86 | Temp 97.2°F | Ht 64.0 in | Wt 177.5 lb

## 2020-08-03 DIAGNOSIS — R0609 Other forms of dyspnea: Secondary | ICD-10-CM

## 2020-08-03 DIAGNOSIS — R0600 Dyspnea, unspecified: Secondary | ICD-10-CM | POA: Diagnosis not present

## 2020-08-03 DIAGNOSIS — N390 Urinary tract infection, site not specified: Secondary | ICD-10-CM

## 2020-08-03 DIAGNOSIS — I959 Hypotension, unspecified: Secondary | ICD-10-CM

## 2020-08-03 DIAGNOSIS — E669 Obesity, unspecified: Secondary | ICD-10-CM | POA: Diagnosis not present

## 2020-08-03 LAB — POC URINALSYSI DIPSTICK (AUTOMATED)
Bilirubin, UA: NEGATIVE
Blood, UA: NEGATIVE
Glucose, UA: NEGATIVE
Ketones, UA: NEGATIVE
Leukocytes, UA: NEGATIVE
Nitrite, UA: NEGATIVE
Protein, UA: NEGATIVE
Spec Grav, UA: 1.015 (ref 1.010–1.025)
Urobilinogen, UA: 0.2 E.U./dL
pH, UA: 6 (ref 5.0–8.0)

## 2020-08-03 NOTE — Progress Notes (Signed)
Subjective:     Frances Hoffman is a 80 y.o. female presenting for Urinary Tract Infection (Chronic and wonders why she gets them so often ) and Obesity (Concerned with weight and wants suggestions on best diet )     HPI  #Recurrent UTI - has had 2 infections in the last year - no other infections - no symptoms currently - not sexually active - no vaginal dryness or irritation  #Low bp - endorses some lightheadedness with standing up quickly or moving fast - has chronic diarrhea - using imodium prn but this leads to hard stools - every few months - lightheadedness with standing - water - not sure how much she drinks  #weight  - would like lose some weight - drinks slimfast as a meal replacement - has an exercise machine but getting out of breath easily and legs get tired - pedal and arm cardio - no sob with walking - will go for a walk 1/2 mile w/o fatigue - eating out twice a week - tries to eat healthy meals otherwise  - thinks she could cut back ice cream  Review of Systems  Constitutional: Negative for chills, fatigue and fever.  Gastrointestinal: Positive for diarrhea. Negative for nausea and vomiting.     Social History   Tobacco Use  Smoking Status Never Smoker  Smokeless Tobacco Never Used        Objective:    BP Readings from Last 3 Encounters:  08/03/20 (!) 80/60  06/09/20 100/66  05/17/20 (!) 108/58   Wt Readings from Last 3 Encounters:  08/03/20 177 lb 8 oz (80.5 kg)  06/09/20 179 lb (81.2 kg)  05/17/20 180 lb (81.6 kg)    BP (!) 80/60   Pulse 86   Temp (!) 97.2 F (36.2 C) (Temporal)   Ht 5\' 4"  (1.626 m)   Wt 177 lb 8 oz (80.5 kg)   SpO2 96%   BMI 30.47 kg/m    Physical Exam Constitutional:      General: She is not in acute distress.    Appearance: She is well-developed. She is not diaphoretic.  HENT:     Right Ear: External ear normal.     Left Ear: External ear normal.     Nose: Nose normal.  Eyes:      Conjunctiva/sclera: Conjunctivae normal.  Cardiovascular:     Rate and Rhythm: Normal rate and regular rhythm.     Heart sounds: No murmur heard.   Pulmonary:     Effort: Pulmonary effort is normal. No respiratory distress.     Breath sounds: Normal breath sounds. No wheezing.  Musculoskeletal:     Cervical back: Neck supple.  Skin:    General: Skin is warm and dry.     Capillary Refill: Capillary refill takes less than 2 seconds.  Neurological:     Mental Status: She is alert. Mental status is at baseline.  Psychiatric:        Mood and Affect: Mood normal.        Behavior: Behavior normal.           Assessment & Plan:   Problem List Items Addressed This Visit      Cardiovascular and Mediastinum   Asx Hypotension (Chronic)    Lower than normal BP today and notes some orthostatic lightheadedness. She will work to increase water intake from 16-24 oz daily to at least 48 oz per day. If still low and symptomatic will plan for midodrine.  Other   Dyspnea on exertion    Notes inability to exercise more than 2 minutes w/o dyspnea on exercise bike. Also gets muscle fatigue. Increase hydration to see if that helps with fatigue. Work on slowly increasing and only doing legs for the next week. If not improvement may consider cardiology evaluation. Stress test in 2015 was normal. She will check in next week if not able to slowly increase activity w/o SOB.       Obesity, Class I, BMI 30-34.9    Noted that she recently lost 2 lbs. Offered nutrition referral but she declined. She will work on increasing exercise tolerance and limiting sweets.        Other Visit Diagnoses    Recurrent UTI    -  Primary   Relevant Orders   POCT Urinalysis Dipstick (Automated) (Completed)     UA w/o infection. Discussed only 4 UTIs in the last 2-3 years which does not meet the qualification. She is not sexually active and has not noticed vaginal symptoms. Will continue to monitor and if  another infection may consider urology vs trial of D-Mannose.   I spent >25 minutes with pt , obtaining history, examining, reviewing chart, documenting encounter and discussing the above plan of care.    Return in about 5 weeks (around 09/07/2020) for for check-in .  [name], MD  This visit occurred during the SARS-CoV-2 public health emergency.  Safety protocols were in place, including screening questions prior to the visit, additional usage of staff PPE, and extensive cleaning of exam room while observing appropriate contact time as indicated for disinfecting solutions.

## 2020-08-03 NOTE — Assessment & Plan Note (Signed)
Noted that she recently lost 2 lbs. Offered nutrition referral but she declined. She will work on increasing exercise tolerance and limiting sweets.

## 2020-08-03 NOTE — Assessment & Plan Note (Addendum)
Notes inability to exercise more than 2 minutes w/o dyspnea on exercise bike. Also gets muscle fatigue. Increase hydration to see if that helps with fatigue. Work on slowly increasing and only doing legs for the next week. If not improvement may consider cardiology evaluation. Stress test in 2015 was normal. She will check in next week if not able to slowly increase activity w/o SOB.

## 2020-08-03 NOTE — Assessment & Plan Note (Signed)
Lower than normal BP today and notes some orthostatic lightheadedness. She will work to increase water intake from 16-24 oz daily to at least 48 oz per day. If still low and symptomatic will plan for midodrine.

## 2020-08-03 NOTE — Patient Instructions (Addendum)
48 oz of water per day at a minumum   Here is what I would recommend:  1) Increase the amount of water you drink a day > specifically drink a glass of water 8 oz or more before every meal  2) Could start a fiber supplement (like metamucil) > You could take this up to 3 times a day, but I would start with 1 time a day until you get used to it. It may cause some stomach upset  3) Make sure you sit down to eat and eat slowly (cut meat one piece at a time) > specifically train your body to eat only at the table (avoiding snacking in front of the TV)  4) Fill up on healthy items first > consider eating a salad with low calorie salad dressing before every meal   5) Make 1/2 of your plate vegetables  6) Keep healthy snacks available for those times when you are bored  7) Consider using a calorie counting app like MyFitnessPal > counting calories helps you make wise choices around snacking. Or if you can afford it you could sign up for Weight Watchers -- and learn about healthy options through a point system    #Weight loss - see above - reduce sweets  #Exercise Plan - Continue walking daily. Work to increase to 1 mile - On exercise machine Step 1: 1 minute of legs only, rest for 1 minute, 1 minute of legs only  Do Step 1 - for 1 week  If still feeling short of breath without improvement in exercise tolerance call back and I think we should consider a cardiology work-up  If not short of breath -- add 10 seconds of exercise every 3-4 days.   Goal would be to get 5 minutes with 1-2 minute break and 5 minutes  Step 2: Add arms for 1 minute with legs

## 2020-08-08 ENCOUNTER — Encounter: Payer: Self-pay | Admitting: Family Medicine

## 2020-08-08 ENCOUNTER — Ambulatory Visit (INDEPENDENT_AMBULATORY_CARE_PROVIDER_SITE_OTHER): Payer: Medicare HMO | Admitting: Family Medicine

## 2020-08-08 ENCOUNTER — Other Ambulatory Visit: Payer: Self-pay

## 2020-08-08 VITALS — BP 92/74 | HR 89 | Temp 98.0°F | Ht 64.0 in | Wt 177.0 lb

## 2020-08-08 DIAGNOSIS — E669 Obesity, unspecified: Secondary | ICD-10-CM

## 2020-08-08 DIAGNOSIS — N390 Urinary tract infection, site not specified: Secondary | ICD-10-CM

## 2020-08-08 DIAGNOSIS — N3001 Acute cystitis with hematuria: Secondary | ICD-10-CM

## 2020-08-08 LAB — POCT URINALYSIS DIP (MANUAL ENTRY)
Bilirubin, UA: NEGATIVE
Glucose, UA: NEGATIVE mg/dL
Ketones, POC UA: NEGATIVE mg/dL
Nitrite, UA: POSITIVE — AB
Protein Ur, POC: 30 mg/dL — AB
Spec Grav, UA: 1.02 (ref 1.010–1.025)
Urobilinogen, UA: 0.2 E.U./dL
pH, UA: 6 (ref 5.0–8.0)

## 2020-08-08 MED ORDER — SULFAMETHOXAZOLE-TRIMETHOPRIM 800-160 MG PO TABS: 1.0000 | ORAL_TABLET | Freq: Two times a day (BID) | ORAL | 0 refills | Status: AC

## 2020-08-08 NOTE — Addendum Note (Signed)
Addended by: Dalia Heading R on: 08/08/2020 11:51 AM   Modules accepted: Orders

## 2020-08-08 NOTE — Progress Notes (Signed)
Subjective:     Frances Hoffman is a 80 y.o. female presenting for Urinary Tract Infection     Urinary Tract Infection  This is a new problem. The current episode started in the past 7 days. The problem occurs every urination. The patient is experiencing no pain. There has been no fever. Associated symptoms include chills, frequency and urgency. Pertinent negatives include no flank pain, hematuria, nausea or vomiting. She has tried increased fluids and acetaminophen (Azo on Saturday ) for the symptoms. The treatment provided mild relief. Her past medical history is significant for recurrent UTIs.   #weight loss - wondering about diet pills - considering weight watchers   Review of Systems  Constitutional: Positive for chills.  Gastrointestinal: Negative for nausea and vomiting.  Genitourinary: Positive for frequency and urgency. Negative for flank pain and hematuria.     Social History   Tobacco Use  Smoking Status Never Smoker  Smokeless Tobacco Never Used        Objective:    BP Readings from Last 3 Encounters:  08/08/20 92/74  08/03/20 (!) 80/60  06/09/20 100/66   Wt Readings from Last 3 Encounters:  08/08/20 177 lb (80.3 kg)  08/03/20 177 lb 8 oz (80.5 kg)  06/09/20 179 lb (81.2 kg)    BP 92/74   Pulse 89   Temp 98 F (36.7 C) (Temporal)   Ht 5\' 4"  (1.626 m)   Wt 177 lb (80.3 kg)   SpO2 96%   BMI 30.38 kg/m    Physical Exam Constitutional:      General: She is not in acute distress.    Appearance: She is well-developed. She is not diaphoretic.  HENT:     Right Ear: External ear normal.     Left Ear: External ear normal.  Eyes:     Conjunctiva/sclera: Conjunctivae normal.  Cardiovascular:     Rate and Rhythm: Normal rate.  Pulmonary:     Effort: Pulmonary effort is normal.  Abdominal:     Tenderness: There is no abdominal tenderness.  Musculoskeletal:     Cervical back: Neck supple.  Skin:    General: Skin is warm and dry.     Capillary  Refill: Capillary refill takes less than 2 seconds.  Neurological:     Mental Status: She is alert. Mental status is at baseline.  Psychiatric:        Mood and Affect: Mood normal.        Behavior: Behavior normal.     UA: +LE, +nitrites, +blood     Assessment & Plan:   Problem List Items Addressed This Visit      Genitourinary   Recurrent UTI    UA positive (though did recently take azo). 3rd infection since March 2022. Discussed urology referral. Will treat with bactrim as worked previously. Referral placed.       Relevant Medications   sulfamethoxazole-trimethoprim (BACTRIM DS) 800-160 MG tablet   Other Relevant Orders   Ambulatory referral to Urology     Other   Obesity, Class I, BMI 30-34.9    Advised against taking diet pills (Keto) and encouraged choosing something like weight watchers which is safer and more sustainable. She will look into this       Other Visit Diagnoses    Acute cystitis with hematuria    -  Primary   Relevant Medications   sulfamethoxazole-trimethoprim (BACTRIM DS) 800-160 MG tablet   Other Relevant Orders   Ambulatory referral to Urology  Return if symptoms worsen or fail to improve.  [name], MD  This visit occurred during the SARS-CoV-2 public health emergency.  Safety protocols were in place, including screening questions prior to the visit, additional usage of staff PPE, and extensive cleaning of exam room while observing appropriate contact time as indicated for disinfecting solutions.

## 2020-08-08 NOTE — Assessment & Plan Note (Addendum)
UA positive (though did recently take azo). 3rd infection since March 2022. Discussed urology referral. Will treat with bactrim as worked previously. Referral placed.

## 2020-08-08 NOTE — Assessment & Plan Note (Signed)
Advised against taking diet pills (Keto) and encouraged choosing something like weight watchers which is safer and more sustainable. She will look into this

## 2020-08-08 NOTE — Patient Instructions (Signed)
Here is what I would recommend - weight loss:  1) Increase the amount of water you drink a day > specifically drink a glass of water 8 oz or more before every meal 2) Could start a fiber supplement (like metamucil) > You could take this up to 3 times a day, but I would start with 1 time a day until you get used to it. It may cause some stomach upset 3) Make sure you sit down to eat and eat slowly (cut meat one piece at a time) > specifically train your body to eat only at the table (avoiding snacking in front of the TV) 4) Fill up on healthy items first > consider eating a salad with low calorie salad dressing before every meal  5) Make 1/2 of your plate vegetables 6) Keep healthy snacks available for those times when you are bored 7) Consider using a calorie counting app like MyFitnessPal > counting calories helps you make wise choices around snacking. Or if you can afford it you could sign up for Weight Watchers -- and learn about healthy options through a point system

## 2020-08-10 LAB — URINE CULTURE
MICRO NUMBER:: 11972904
SPECIMEN QUALITY:: ADEQUATE

## 2020-08-22 ENCOUNTER — Ambulatory Visit: Payer: Medicare HMO | Admitting: Family Medicine

## 2020-08-23 ENCOUNTER — Ambulatory Visit (INDEPENDENT_AMBULATORY_CARE_PROVIDER_SITE_OTHER): Payer: Medicare HMO | Admitting: Family Medicine

## 2020-08-23 ENCOUNTER — Other Ambulatory Visit: Payer: Self-pay

## 2020-08-23 VITALS — BP 90/58 | HR 76 | Temp 97.8°F | Wt 178.5 lb

## 2020-08-23 DIAGNOSIS — L603 Nail dystrophy: Secondary | ICD-10-CM | POA: Diagnosis not present

## 2020-08-23 DIAGNOSIS — E663 Overweight: Secondary | ICD-10-CM

## 2020-08-23 DIAGNOSIS — F0150 Vascular dementia without behavioral disturbance: Secondary | ICD-10-CM

## 2020-08-23 DIAGNOSIS — L608 Other nail disorders: Secondary | ICD-10-CM | POA: Diagnosis not present

## 2020-08-23 DIAGNOSIS — R69 Illness, unspecified: Secondary | ICD-10-CM | POA: Diagnosis not present

## 2020-08-23 MED ORDER — DONEPEZIL HCL 5 MG PO TABS: 5.0000 mg | ORAL_TABLET | Freq: Every day | ORAL | 0 refills | Status: DC

## 2020-08-23 NOTE — Progress Notes (Signed)
Subjective:     Frances Hoffman is a 80 y.o. female presenting for Follow-up (Memory issues)     HPI  #Recurrent UTI - completed abx - still with some tingling - has appointment with urology next month  #memory loss - has prescription for Aricept but reports she does not take - memory is not as good as it use to be - not sure what her reaction to the aricept was   #nails cracking - noticed last week  Review of Systems   Social History   Tobacco Use  Smoking Status Never  Smokeless Tobacco Never        Objective:    BP Readings from Last 3 Encounters:  08/23/20 (!) 90/58  08/08/20 92/74  08/03/20 (!) 80/60   Wt Readings from Last 3 Encounters:  08/23/20 178 lb 8 oz (81 kg)  08/08/20 177 lb (80.3 kg)  08/03/20 177 lb 8 oz (80.5 kg)    BP (!) 90/58   Pulse 76   Temp 97.8 F (36.6 C) (Temporal)   Wt 178 lb 8 oz (81 kg)   SpO2 95%   BMI 30.64 kg/m    Physical Exam Constitutional:      General: She is not in acute distress.    Appearance: She is well-developed. She is not diaphoretic.  HENT:     Right Ear: External ear normal.     Left Ear: External ear normal.     Nose: Nose normal.  Eyes:     Conjunctiva/sclera: Conjunctivae normal.  Cardiovascular:     Rate and Rhythm: Normal rate.  Pulmonary:     Effort: Pulmonary effort is normal.  Musculoskeletal:     Cervical back: Neck supple.  Skin:    General: Skin is warm and dry.     Capillary Refill: Capillary refill takes less than 2 seconds.     Comments: Nails with white vertical lines on all nails. Brittle appearing. Right 2nd digit with crack a the tip and indentation of the outer edge in the center.   Neurological:     Mental Status: She is alert. Mental status is at baseline.  Psychiatric:        Mood and Affect: Mood normal.        Behavior: Behavior normal.          Assessment & Plan:   Problem List Items Addressed This Visit       Nervous and Auditory   Vascular  dementia without behavioral disturbance ([place]) - Primary (Chronic)    Failed to f/u with neurology since 2019. [place] 19/30 with decline from last MMSE of 28/30. Discussed restarting donepezil 5 mg with plan to increase. Referral to Dr. Krista Blue who she saw previously for follow-up and continued care.        Relevant Medications   donepezil (ARICEPT) 5 MG tablet   Other Relevant Orders   Ambulatory referral to Neurology   Comprehensive metabolic panel   Vitamin J57     Musculoskeletal and Integument   Nail deformity    Will get some labs for nutrients given brittle and white lines, however, with indentation in the nail bed discussed this could indicate a lesion under the nail and advise dermatology evaluation. F/u labs.        Relevant Orders   Ambulatory referral to Dermatology     Other   Overweight (Chronic)    Advised that she not take OTC supplements - again asked about a keto supplement -  discussed with heart and kidney history this would likely be bad with unknown side effects or details. Advised healthy diet, she declined nutrition referral       Other Visit Diagnoses     Brittle nails       Relevant Orders   TSH   Ambulatory referral to Dermatology   Ferritin        Return in about 4 weeks (around 09/20/2020) for memory.  [name], MD  This visit occurred during the SARS-CoV-2 public health emergency.  Safety protocols were in place, including screening questions prior to the visit, additional usage of staff PPE, and extensive cleaning of exam room while observing appropriate contact time as indicated for disinfecting solutions.

## 2020-08-23 NOTE — Assessment & Plan Note (Signed)
Will get some labs for nutrients given brittle and white lines, however, with indentation in the nail bed discussed this could indicate a lesion under the nail and advise dermatology evaluation. F/u labs.

## 2020-08-23 NOTE — Assessment & Plan Note (Signed)
Failed to f/u with neurology since 2019. [place] 19/30 with decline from last MMSE of 28/30. Discussed restarting donepezil 5 mg with plan to increase. Referral to Dr. Krista Blue who she saw previously for follow-up and continued care.

## 2020-08-23 NOTE — Patient Instructions (Addendum)
#  Memory - start Aricept 5 mg at nighttime - return in 4-6 weeks (or see neurology)   #Nails - labs - dermatology referral

## 2020-08-23 NOTE — Assessment & Plan Note (Signed)
Advised that she not take OTC supplements - again asked about a keto supplement - discussed with heart and kidney history this would likely be bad with unknown side effects or details. Advised healthy diet, she declined nutrition referral

## 2020-08-24 ENCOUNTER — Telehealth: Payer: Self-pay | Admitting: Family Medicine

## 2020-08-24 LAB — COMPREHENSIVE METABOLIC PANEL
ALT: 22 U/L (ref 0–35)
AST: 23 U/L (ref 0–37)
Albumin: 4 g/dL (ref 3.5–5.2)
Alkaline Phosphatase: 97 U/L (ref 39–117)
BUN: 10 mg/dL (ref 6–23)
CO2: 27 mEq/L (ref 19–32)
Calcium: 9.5 mg/dL (ref 8.4–10.5)
Chloride: 103 mEq/L (ref 96–112)
Creatinine, Ser: 0.94 mg/dL (ref 0.40–1.20)
GFR: 57.55 mL/min — ABNORMAL LOW (ref >60.00)
Glucose, Bld: 99 mg/dL (ref 70–99)
Potassium: 4.2 mEq/L (ref 3.5–5.1)
Sodium: 139 mEq/L (ref 135–145)
Total Bilirubin: 0.5 mg/dL (ref 0.2–1.2)
Total Protein: 6.5 g/dL (ref 6.0–8.3)

## 2020-08-24 LAB — FERRITIN: Ferritin: 111.1 ng/mL (ref 10.0–291.0)

## 2020-08-24 LAB — TSH: TSH: 1.89 u[IU]/mL (ref 0.35–4.50)

## 2020-08-24 LAB — VITAMIN B12: Vitamin B-12: 875 pg/mL (ref 211–911)

## 2020-08-24 NOTE — Telephone Encounter (Signed)
Frances Hoffman called in and was seen by Dr. Einar Pheasant last week and she referred her to see a dr. Marcial Pacas. She called her and she was told she needed a referral to see her.   Please advise

## 2020-08-24 NOTE — Telephone Encounter (Signed)
Spoke to pt told her referral was placed yesterday for her to see Dr. Marcial Pacas. Asked pt if she called the office? Pt said she called today. Told pt to call office back and let them know there is a referral placed for her. They may have not seen it yet. Pt verbalized understanding.

## 2020-09-16 ENCOUNTER — Encounter: Payer: Self-pay | Admitting: Family Medicine

## 2020-09-16 DIAGNOSIS — H2513 Age-related nuclear cataract, bilateral: Secondary | ICD-10-CM | POA: Diagnosis not present

## 2020-09-16 DIAGNOSIS — H04123 Dry eye syndrome of bilateral lacrimal glands: Secondary | ICD-10-CM | POA: Diagnosis not present

## 2020-09-16 DIAGNOSIS — H524 Presbyopia: Secondary | ICD-10-CM | POA: Diagnosis not present

## 2020-09-16 DIAGNOSIS — H35362 Drusen (degenerative) of macula, left eye: Secondary | ICD-10-CM | POA: Diagnosis not present

## 2020-09-16 DIAGNOSIS — H25013 Cortical age-related cataract, bilateral: Secondary | ICD-10-CM | POA: Diagnosis not present

## 2020-09-20 ENCOUNTER — Other Ambulatory Visit: Payer: Self-pay

## 2020-09-20 ENCOUNTER — Ambulatory Visit (INDEPENDENT_AMBULATORY_CARE_PROVIDER_SITE_OTHER): Payer: Medicare HMO | Admitting: Family Medicine

## 2020-09-20 ENCOUNTER — Encounter: Payer: Self-pay | Admitting: Family Medicine

## 2020-09-20 VITALS — BP 98/76 | HR 75 | Temp 97.8°F | Ht 64.0 in | Wt 177.0 lb

## 2020-09-20 DIAGNOSIS — E663 Overweight: Secondary | ICD-10-CM | POA: Diagnosis not present

## 2020-09-20 DIAGNOSIS — F0150 Vascular dementia without behavioral disturbance: Secondary | ICD-10-CM | POA: Diagnosis not present

## 2020-09-20 DIAGNOSIS — L608 Other nail disorders: Secondary | ICD-10-CM | POA: Diagnosis not present

## 2020-09-20 DIAGNOSIS — R69 Illness, unspecified: Secondary | ICD-10-CM | POA: Diagnosis not present

## 2020-09-20 MED ORDER — DONEPEZIL HCL 10 MG PO TABS: 10.0000 mg | ORAL_TABLET | Freq: Every day | ORAL | 3 refills | Status: DC

## 2020-09-20 NOTE — Progress Notes (Signed)
Subjective:     Frances Hoffman is a 80 y.o. female presenting for Follow-up     HPI  #Memory loss - lives with husband - sleeping better with donepezil - husband hasn't noticed a difference  #Nail - did get a call from the dermatologist  #weight gain - did start a keto supplement - now having regular bm - no change in weight   Review of Systems  08/23/2020: Clinic - Dementia - restart donepezil 5 mg, referral to Dr. Krista Blue. Nails - derm referral and labs. Overweight - advised healthy diet  Social History   Tobacco Use  Smoking Status Never  Smokeless Tobacco Never        Objective:    BP Readings from Last 3 Encounters:  09/20/20 98/76  08/23/20 (!) 90/58  08/08/20 92/74   Wt Readings from Last 3 Encounters:  09/20/20 177 lb (80.3 kg)  08/23/20 178 lb 8 oz (81 kg)  08/08/20 177 lb (80.3 kg)    BP 98/76   Pulse 75   Temp 97.8 F (36.6 C) (Temporal)   Ht 5\' 4"  (1.626 m)   Wt 177 lb (80.3 kg)   SpO2 96%   BMI 30.38 kg/m    Physical Exam Constitutional:      General: She is not in acute distress.    Appearance: She is well-developed. She is not diaphoretic.  HENT:     Right Ear: External ear normal.     Left Ear: External ear normal.     Nose: Nose normal.  Eyes:     Conjunctiva/sclera: Conjunctivae normal.  Cardiovascular:     Rate and Rhythm: Normal rate and regular rhythm.  Pulmonary:     Effort: Pulmonary effort is normal. No respiratory distress.     Breath sounds: Normal breath sounds. No wheezing.  Musculoskeletal:     Cervical back: Neck supple.  Skin:    General: Skin is warm and dry.     Capillary Refill: Capillary refill takes less than 2 seconds.  Neurological:     Mental Status: She is alert. Mental status is at baseline.  Psychiatric:        Mood and Affect: Mood normal.        Behavior: Behavior normal.          Assessment & Plan:   Problem List Items Addressed This Visit       Nervous and Auditory   Vascular  dementia without behavioral disturbance ([facility]) (Chronic)    Minimal improvement. Will increase donepezil 5 mg > 10mg . Has neurology f/u in 2 months. She can discuss further management with them. Encouraged exercise to help with memory.        Relevant Medications   donepezil (ARICEPT) 10 MG tablet     Musculoskeletal and Integument   Nail deformity    She did not schedule with dermatology. Reviewed with indentation there is a chance his could be cancer and would recommend dermatology - she will consider.          Other   Overweight - Primary (Chronic)    Pt has tried supplement w/o results - advised stopping. Cont healthy diet. Encouraged trying to slowly increase exercise tolerance.          Return in about 7 months (around 04/23/2021).  [name], MD  This visit occurred during the SARS-CoV-2 public health emergency.  Safety protocols were in place, including screening questions prior to the visit, additional usage of staff PPE, and  extensive cleaning of exam room while observing appropriate contact time as indicated for disinfecting solutions.

## 2020-09-20 NOTE — Patient Instructions (Addendum)
#  nail concern - The changes can sometimes be caused by cancerous lesions under the nail - call back the dermatologist if you change  #Memory loss - increase the Donepezil to 10 mg (new prescription sent) - Keep appointment with Dr. Krista Blue in September - see me as needed  - Send me a Pharmacist, community message with the name of the Doctor at [facility] if you want to try and see them  #Weight gain - work on regular exercise - work on [employer] - work your way up to exercising 20 minutes daily - start with 2 minutes - every day,

## 2020-09-20 NOTE — Assessment & Plan Note (Signed)
She did not schedule with dermatology. Reviewed with indentation there is a chance his could be cancer and would recommend dermatology - she will consider.

## 2020-09-20 NOTE — Assessment & Plan Note (Signed)
Pt has tried supplement w/o results - advised stopping. Cont healthy diet. Encouraged trying to slowly increase exercise tolerance.

## 2020-09-20 NOTE — Assessment & Plan Note (Signed)
Minimal improvement. Will increase donepezil 5 mg > 10mg . Has neurology f/u in 2 months. She can discuss further management with them. Encouraged exercise to help with memory.

## 2020-09-23 DIAGNOSIS — N390 Urinary tract infection, site not specified: Secondary | ICD-10-CM | POA: Diagnosis not present

## 2020-09-30 DIAGNOSIS — N3020 Other chronic cystitis without hematuria: Secondary | ICD-10-CM | POA: Diagnosis not present

## 2020-10-05 ENCOUNTER — Other Ambulatory Visit: Payer: Self-pay | Admitting: Family Medicine

## 2020-10-05 DIAGNOSIS — F39 Unspecified mood [affective] disorder: Secondary | ICD-10-CM

## 2020-10-21 DIAGNOSIS — N390 Urinary tract infection, site not specified: Secondary | ICD-10-CM | POA: Diagnosis not present

## 2020-10-21 DIAGNOSIS — B9620 Unspecified Escherichia coli [E. coli] as the cause of diseases classified elsewhere: Secondary | ICD-10-CM | POA: Diagnosis not present

## 2020-11-13 ENCOUNTER — Other Ambulatory Visit: Payer: Self-pay | Admitting: Family Medicine

## 2020-11-13 DIAGNOSIS — E039 Hypothyroidism, unspecified: Secondary | ICD-10-CM

## 2020-11-14 ENCOUNTER — Telehealth: Payer: Self-pay | Admitting: Family Medicine

## 2020-11-14 NOTE — Telephone Encounter (Signed)
  Encourage patient to contact the pharmacy for refills or they can request refills through [place]:  Please schedule appointment if longer than 1 year  NEXT APPOINTMENT DATE:  MEDICATION:levothyroxine (EUTHYROX) 75 MCG tablet  Is the patient out of medication?   [place]  Let patient know to contact pharmacy at the end of the day to make sure medication is ready.  Please notify patient to allow 48-72 hours to process  CLINICAL FILLS OUT ALL BELOW:   LAST REFILL:  QTY:  REFILL DATE:    OTHER COMMENTS:    Okay for refill?  Please advise

## 2020-11-14 NOTE — Telephone Encounter (Signed)
Refill sent in

## 2020-11-24 ENCOUNTER — Ambulatory Visit: Payer: Medicare HMO | Admitting: Neurology

## 2020-11-24 ENCOUNTER — Other Ambulatory Visit: Payer: Self-pay

## 2020-11-24 ENCOUNTER — Encounter: Payer: Self-pay | Admitting: Neurology

## 2020-11-24 VITALS — Ht 64.0 in | Wt 177.0 lb

## 2020-11-24 DIAGNOSIS — G3184 Mild cognitive impairment, so stated: Secondary | ICD-10-CM | POA: Diagnosis not present

## 2020-11-24 MED ORDER — MEMANTINE HCL 10 MG PO TABS: 10.0000 mg | ORAL_TABLET | Freq: Two times a day (BID) | ORAL | 11 refills | Status: DC

## 2020-11-24 NOTE — Progress Notes (Signed)
Chief Complaint  Patient presents with   New Patient (Initial Visit)    New rm , with husband, here to discuss memory, states aricept gives her headaches       ASSESSMENT AND PLAN  Frances Hoffman is a 80 y.o. female   Mild cognitive impairment  MRI of the brain in 2019 showed generalized atrophy moderate supratentorium small vessel disease  Laboratory evaluation showed no treatable etiology  Family history of dementia, Parkinson's disease,  Most suggestive of early stage of central nervous system degenerative disorder,  MoCA examination 25/30  Keep Aricept 10 mg daily  Add on Namenda 10 mg twice a day  Encouraged her moderate exercise  Continue follow-up with primary care physician  DIAGNOSTIC DATA (LABS, IMAGING, TESTING) - I reviewed patient records, labs, notes, testing and imaging myself where available.   MEDICAL HISTORY:  Frances Hoffman is a 80 year old female, seen in request by her primary care physician Dr. Einar Pheasant, Jobe Marker, for evaluation of memory loss, she is accompanied by her husband at today's visit November 24, 2020  I reviewed and summarized the referring note.  Past medical history Breast cancer in 2000, left lobectomy, radiation Depression GERD Hyperlipidemia Hypothyroidism  I saw her more than 3 years ago, for complaints of memory loss  Family history of memory loss, father had Parkinson's disease, memory loss, sister also suffered dementia  Since 2018 she was noted to have mild memory loss, gradually getting worse, especially short-term memory, MoCA examination 25/30, previously complains of mood swings, now stabilized with Lexapro 20 mg daily, she is still driving, but got lost couple times,  I personally reviewed MRI of the brain in February 2019, mild atrophy, moderate supratentorium small vessel disease  Laboratory evaluation showed normal TSH B12, CMP, CBC, She has been taking Aricept, 10 mg daily, tolerating it well,  PHYSICAL EXAM:    Vitals:   11/24/20 1051  Weight: 177 lb (80.3 kg)  Height: 5\' 4"  (1.626 m)   Not recorded     Body mass index is 30.38 kg/m.  PHYSICAL EXAMNIATION:  Gen: NAD, conversant, well nourised, well groomed                     Cardiovascular: Regular rate rhythm, no peripheral edema, warm, nontender. Eyes: Conjunctivae clear without exudates or hemorrhage Neck: Supple, no carotid bruits. Pulmonary: Clear to auscultation bilaterally   NEUROLOGICAL EXAM:  MENTAL STATUS: Speech:    Speech is normal; fluent and spontaneous with normal comprehension.  Cognition:    Montreal Cognitive Assessment  11/24/2020 08/23/2020  Visuospatial/ Executive (0/5) 5 1  Naming (0/3) 3 2  Attention: Read list of digits (0/2) 2 2  Attention: Read list of letters (0/1) 1 1  Attention: Serial 7 subtraction starting at 100 (0/3) 3 1  Language: Repeat phrase (0/2) 1 2  Language : Fluency (0/1) 1 0  Abstraction (0/2) 2 2  Delayed Recall (0/5) 1 2  Orientation (0/6) 6 6  Total 25 19  Adjusted Score (based on education) 25 -      CRANIAL NERVES: CN II: Visual fields are full to confrontation. Pupils are round equal and briskly reactive to light. CN III, IV, VI: extraocular movement are normal. No ptosis. CN V: Facial sensation is intact to light touch CN VII: Face is symmetric with normal eye closure  CN VIII: Hearing is normal to causal conversation. CN IX, X: Phonation is normal. CN XI: Head turning and shoulder shrug are intact  MOTOR: There is no pronator drift of out-stretched arms. Muscle bulk and tone are normal. Muscle strength is normal.  REFLEXES: Reflexes are 2+ and symmetric at the biceps, triceps, knees, and ankles. Plantar responses are flexor.  SENSORY: Intact to light touch, pinprick and vibratory sensation are intact in fingers and toes.  COORDINATION: There is no trunk or limb dysmetria noted.  GAIT/STANCE: Posture is normal. Gait is steady with normal steps, base, arm  swing, and turning. Heel and toe walking are normal. Tandem gait is normal.  Romberg is absent.  REVIEW OF SYSTEMS:  Full 14 system review of systems performed and notable only for as above All other review of systems were negative.   ALLERGIES: Allergies  Allergen Reactions   Ciprofloxacin Other (See Comments)    Unknown (vertigo??)   Citalopram Hydrobromide Other (See Comments)    Intrusive/ odd thoughts    Flagyl [Metronidazole] Other (See Comments)    Unknown??   Oxycodone Other (See Comments)    Headaches    HOME MEDICATIONS: Current Outpatient Medications  Medication Sig Dispense Refill   atorvastatin (LIPITOR) 20 MG tablet TAKE 1 TABLET BY MOUTH AT BEDTIME 90 tablet 3   b complex vitamins tablet Take 1 tablet by mouth daily.     calcium carbonate (OS-CAL - DOSED IN MG OF ELEMENTAL CALCIUM) 1250 (500 CA) MG tablet Take 1 tablet by mouth daily.     Calcium Citrate (CITRACAL PO) Take by mouth.     carboxymethylcellulose (REFRESH PLUS) 0.5 % SOLN Place 1 drop into both eyes 3 (three) times daily as needed (for dryness).     Cholecalciferol (VITAMIN D3) 2000 UNITS capsule Take 2,000 Units by mouth daily.     Cranberry 125 MG TABS Take 2 tablets by mouth.     cyanocobalamin (CVS VITAMIN B12) 2000 MCG tablet Take 1 tablet (2,000 mcg total) by mouth daily.     donepezil (ARICEPT) 10 MG tablet Take 1 tablet (10 mg total) by mouth at bedtime. 90 tablet 3   escitalopram (LEXAPRO) 20 MG tablet Take 1 tablet by mouth once daily 90 tablet 1   EUTHYROX 75 MCG tablet Take 1 tablet by mouth once daily 90 tablet 1   fluticasone (FLONASE) 50 MCG/ACT nasal spray 1 spray 2 times daily after sinus rinses- Neil med or AYR 16 g 6   ibuprofen (ADVIL,MOTRIN) 600 MG tablet Take 1 tablet (600 mg total) by mouth every 8 (eight) hours as needed (severer pain/ headache). 10 tablet 0   loperamide (IMODIUM) 1 MG/5ML solution Take by mouth as needed for diarrhea or loose stools. Teaspoons amounts      Multiple Vitamin (MULTIVITAMIN) tablet Take 1 tablet by mouth daily.     Omega-3 Fatty Acids (FISH OIL PO) Take 2 capsules by mouth 2 (two) times daily.     omeprazole (PRILOSEC) 20 MG capsule Take by mouth daily.     vitamin C (ASCORBIC ACID) 500 MG tablet Take 500 mg by mouth daily.     No current facility-administered medications for this visit.    PAST MEDICAL HISTORY: Past Medical History:  Diagnosis Date   Adjustment disorder with mixed anxiety and depressed mood 12/12/2007   Qualifier: Diagnosis of  By: Copland MD, Spencer     Breast cancer ([place])    Cancer ([place]) 2000   breast had lumpectomy/radiation x36,mammo ,no chemo   Cataract of both eyes    Depression    Dysuria-frequency syndrome    takes AZO   Frozen shoulder  Gall stones    GERD 12/12/2007   Qualifier: Diagnosis of  By: Copland MD, Spencer     Hyperlipidemia    Hypothyroidism 12/12/2007   Qualifier: Diagnosis of  By: Lorelei Pont MD, Spencer     Memory difficulties 09/27/2017   Memory loss    Memory loss 09/22/2014   Osteopenia    BMD 2004, WNL 2008   Personal history of radiation therapy    Pneumonia    Recurrent UTI    Shingles    chronic body pain, left side of body   Shortness of breath dyspnea    when climbing stairs only   Wears glasses     PAST SURGICAL HISTORY: Past Surgical History:  Procedure Laterality Date   BREAST LUMPECTOMY Left 2000   CHOLECYSTECTOMY N/A 02/21/2015   Procedure: LAPAROSCOPIC CHOLECYSTECTOMY WITH INTRAOPERATIVE CHOLANGIOGRAM;  Surgeon: Greer Pickerel, MD;  Location: [facility];  Service: General;  Laterality: N/A;   COLONOSCOPY     MULTIPLE TOOTH EXTRACTIONS      FAMILY HISTORY: Family History  Problem Relation Age of Onset   Osteoporosis Mother    Parkinsonism Father    Breast cancer Sister 30   Breast cancer Other    Coronary artery disease Sister    Ovarian cancer Sister    Heart disease Maternal Grandmother    Heart disease Maternal Grandfather    Heart attack Maternal  Grandfather 29   Heart disease Son    Heart attack Son 20    SOCIAL HISTORY: Social History   Socioeconomic History   Marital status: Married    Spouse name: Merchandiser, retail    Number of children: 2   Years of education: High school   Highest education level: High school graduate  Occupational History   Not on file  Tobacco Use   Smoking status: Never   Smokeless tobacco: Never  Vaping Use   Vaping Use: Never used  Substance and Sexual Activity   Alcohol use: No    Alcohol/week: 0.0 standard drinks   Drug use: No   Sexual activity: Not Currently    Birth control/protection: Post-menopausal  Other Topics Concern   Not on file  Social History Narrative   Lives at home with her husband. Orpah Greek   2 adult sons  - Hassell and Randall Hiss   2 Grandchildren - both other   Exercise: goes to silver sneakers - at least 2 times a week   Diet: anything that tastes good, less pepsi's with the tongue   Enjoys: square dance, eating out with friends, going to church - Sunday and Wednesday   Social Determinants of Health   Financial Resource Strain: Not on file  Food Insecurity: Not on file  Transportation Needs: Not on file  Physical Activity: Not on file  Stress: Not on file  Social Connections: Not on file  Intimate Partner Violence: Not on file      Marcial Pacas, M.D. Ph.D.  [facility] [address] [place], [place] [address] Ph: [phone] Fax: [phone]  CC:  [name], MD [address] [place],  [place] [address]  [name], MD

## 2020-12-06 ENCOUNTER — Telehealth: Payer: Self-pay | Admitting: Family Medicine

## 2020-12-06 NOTE — Telephone Encounter (Signed)
Routing to MA:  It is most likely the donepezil.  She could hold for 1-2 weeks to see if diarrhea improves.   Sending to Dr. Krista Blue has FYI

## 2020-12-06 NOTE — Telephone Encounter (Signed)
Pt called stating that she is taking donepezil (ARICEPT) 10 MG tablet and memantine (NAMENDA) 10 MG tablet and was wondering was one of these prescription causing her to have diarrhea. Please advise.

## 2020-12-07 NOTE — Telephone Encounter (Signed)
Spoke to pt and relayed Dr. Verda Cumins message. Pt says she will hold the donepezil for 2 weeks and then give [place] an update.

## 2020-12-09 DIAGNOSIS — N3020 Other chronic cystitis without hematuria: Secondary | ICD-10-CM | POA: Diagnosis not present

## 2020-12-30 NOTE — Telephone Encounter (Signed)
Pt called in stated she's having right ear pain and would like something call in . Would like a call back # [phone]

## 2021-01-02 ENCOUNTER — Telehealth: Payer: Self-pay | Admitting: Family Medicine

## 2021-01-02 NOTE — Telephone Encounter (Signed)
Pt has a ear ache. Pt would like a call back to discuss.

## 2021-01-04 ENCOUNTER — Ambulatory Visit: Payer: Medicare HMO | Admitting: Family Medicine

## 2021-01-06 ENCOUNTER — Other Ambulatory Visit: Payer: Self-pay

## 2021-01-06 ENCOUNTER — Ambulatory Visit (INDEPENDENT_AMBULATORY_CARE_PROVIDER_SITE_OTHER): Payer: Medicare HMO | Admitting: Nurse Practitioner

## 2021-01-06 VITALS — BP 96/64 | HR 75 | Temp 97.5°F | Resp 10 | Ht 64.0 in | Wt 175.0 lb

## 2021-01-06 DIAGNOSIS — H6593 Unspecified nonsuppurative otitis media, bilateral: Secondary | ICD-10-CM | POA: Diagnosis not present

## 2021-01-06 MED ORDER — LEVOCETIRIZINE DIHYDROCHLORIDE 5 MG PO TABS: 5.0000 mg | ORAL_TABLET | Freq: Every evening | ORAL | 0 refills | Status: DC

## 2021-01-06 NOTE — Patient Instructions (Signed)
Nice to see you  Start taking the flonase and antihistamine that I sent to your pharmacy

## 2021-01-06 NOTE — Assessment & Plan Note (Signed)
Patient presents with fluid behind bilateral ears right worse than left.  Patient has Flonase at home that she has not been using we will encourage her to start using Naprosyn and antihistamine along with information on OMT.  Patient to take medications continue to monitor follow-up if no improvement.

## 2021-01-06 NOTE — Progress Notes (Signed)
Acute Office Visit  Subjective:    Patient ID: Frances Hoffman, female    DOB: 07-31-1940, 80 y.o.   MRN: [id number]  Chief Complaint  Patient presents with   Ear Fullness    Right ear fullness/pressure, started about a week ago. Decreased hearing from the ear.     Patient is in today for Right ear fullness  Started approx a week ago States it feels like there is water in it Hurts intermittently/discomfort  Decreased hearing per patient report Has not try anything over the counter  Past Medical History:  Diagnosis Date   Adjustment disorder with mixed anxiety and depressed mood 12/12/2007   Qualifier: Diagnosis of  By: Copland MD, Spencer     Breast cancer ([facility])    Cancer ([facility]) 2000   breast had lumpectomy/radiation x36,mammo ,no chemo   Cataract of both eyes    Depression    Dysuria-frequency syndrome    takes AZO   Frozen shoulder    Gall stones    GERD 12/12/2007   Qualifier: Diagnosis of  By: Copland MD, Spencer     Hyperlipidemia    Hypothyroidism 12/12/2007   Qualifier: Diagnosis of  By: Patsy Lager MD, Spencer     Memory difficulties 09/27/2017   Memory loss    Memory loss 09/22/2014   Osteopenia    BMD 2004, WNL 2008   Personal history of radiation therapy    Pneumonia    Recurrent UTI    Shingles    chronic body pain, left side of body   Shortness of breath dyspnea    when climbing stairs only   Wears glasses     Past Surgical History:  Procedure Laterality Date   BREAST LUMPECTOMY Left 2000   CHOLECYSTECTOMY N/A 02/21/2015   Procedure: LAPAROSCOPIC CHOLECYSTECTOMY WITH INTRAOPERATIVE CHOLANGIOGRAM;  Surgeon: Gaynelle Adu, MD;  Location: [facility];  Service: General;  Laterality: N/A;   COLONOSCOPY     MULTIPLE TOOTH EXTRACTIONS      Family History  Problem Relation Age of Onset   Osteoporosis Mother    Parkinsonism Father    Breast cancer Sister 72   Breast cancer Other    Coronary artery disease Sister    Ovarian cancer Sister    Heart disease  Maternal Grandmother    Heart disease Maternal Grandfather    Heart attack Maternal Grandfather 50   Heart disease Son    Heart attack Son 42    Social History   Socioeconomic History   Marital status: Married    Spouse name: Financial planner    Number of children: 2   Years of education: High school   Highest education level: High school graduate  Occupational History   Not on file  Tobacco Use   Smoking status: Never   Smokeless tobacco: Never  Vaping Use   Vaping Use: Never used  Substance and Sexual Activity   Alcohol use: No    Alcohol/week: 0.0 standard drinks   Drug use: No   Sexual activity: Not Currently    Birth control/protection: Post-menopausal  Other Topics Concern   Not on file  Social History Narrative   Lives at home with her husband. - Dwight   2 adult sons  - [place]ylwood and Minerva Areola   2 Grandchildren - both other   Exercise: goes to silver sneakers - at least 2 times a week   Diet: anything that tastes good, less pepsi's with the tongue   Enjoys: square dance, eating out with friends,  going to church - Sunday and Wednesday   Social Determinants of Health   Financial Resource Strain: Not on file  Food Insecurity: Not on file  Transportation Needs: Not on file  Physical Activity: Not on file  Stress: Not on file  Social Connections: Not on file  Intimate Partner Violence: Not on file    Outpatient Medications Prior to Visit  Medication Sig Dispense Refill   atorvastatin (LIPITOR) 20 MG tablet TAKE 1 TABLET BY MOUTH AT BEDTIME 90 tablet 3   b complex vitamins tablet Take 1 tablet by mouth daily.     calcium carbonate (OS-CAL - DOSED IN MG OF ELEMENTAL CALCIUM) 1250 (500 CA) MG tablet Take 1 tablet by mouth daily.     Calcium Citrate (CITRACAL PO) Take by mouth.     carboxymethylcellulose (REFRESH PLUS) 0.5 % SOLN Place 1 drop into both eyes 3 (three) times daily as needed (for dryness).     Cholecalciferol (VITAMIN D3) 2000 UNITS capsule Take 2,000 Units by  mouth daily.     Cranberry 125 MG TABS Take 2 tablets by mouth.     cyanocobalamin (CVS VITAMIN B12) 2000 MCG tablet Take 1 tablet (2,000 mcg total) by mouth daily.     donepezil (ARICEPT) 10 MG tablet Take 1 tablet (10 mg total) by mouth at bedtime. 90 tablet 3   escitalopram (LEXAPRO) 20 MG tablet Take 1 tablet by mouth once daily 90 tablet 1   EUTHYROX 75 MCG tablet Take 1 tablet by mouth once daily 90 tablet 1   fluticasone (FLONASE) 50 MCG/ACT nasal spray 1 spray 2 times daily after sinus rinses- Neil med or AYR 16 g 6   ibuprofen (ADVIL,MOTRIN) 600 MG tablet Take 1 tablet (600 mg total) by mouth every 8 (eight) hours as needed (severer pain/ headache). 10 tablet 0   loperamide (IMODIUM) 1 MG/5ML solution Take by mouth as needed for diarrhea or loose stools. Teaspoons amounts     memantine (NAMENDA) 10 MG tablet Take 1 tablet (10 mg total) by mouth 2 (two) times daily. 60 tablet 11   Multiple Vitamin (MULTIVITAMIN) tablet Take 1 tablet by mouth daily.     Omega-3 Fatty Acids (FISH OIL PO) Take 2 capsules by mouth 2 (two) times daily.     omeprazole (PRILOSEC) 20 MG capsule Take by mouth daily.     vitamin C (ASCORBIC ACID) 500 MG tablet Take 500 mg by mouth daily.     No facility-administered medications prior to visit.    Allergies  Allergen Reactions   Ciprofloxacin Other (See Comments)    Unknown (vertigo??)   Citalopram Hydrobromide Other (See Comments)    Intrusive/ odd thoughts    Flagyl [Metronidazole] Other (See Comments)    Unknown??   Oxycodone Other (See Comments)    Headaches    Review of Systems  Constitutional:  Negative for chills, fatigue and fever.  HENT:  Positive for rhinorrhea. Negative for congestion, sinus pressure, sneezing and sore throat.   Respiratory:  Negative for cough and shortness of breath.   Cardiovascular:  Negative for chest pain.  Gastrointestinal:  Negative for abdominal pain, diarrhea, nausea and vomiting.  Neurological:  Negative for  headaches.      Objective:    Physical Exam Vitals and nursing note reviewed.  Constitutional:      Appearance: Normal appearance.  HENT:     Right Ear: Ear canal and external ear normal. A middle ear effusion is present.     Left Ear:  Tympanic membrane, ear canal and external ear normal.     Nose:     Right Sinus: No maxillary sinus tenderness or frontal sinus tenderness.     Left Sinus: No maxillary sinus tenderness or frontal sinus tenderness.     Mouth/Throat:     Mouth: Mucous membranes are moist.     Pharynx: Oropharynx is clear.  Cardiovascular:     Rate and Rhythm: Normal rate and regular rhythm.  Pulmonary:     Effort: Pulmonary effort is normal.     Breath sounds: Normal breath sounds.  Neurological:     Mental Status: She is alert.  Psychiatric:        Mood and Affect: Mood normal.        Behavior: Behavior normal.        Thought Content: Thought content normal.        Judgment: Judgment normal.    BP 96/64   Pulse 75   Temp (!) 97.5 F (36.4 C)   Resp 10   Ht 5\' 4"  (1.626 m)   Wt 175 lb (79.4 kg)   SpO2 96%   BMI 30.04 kg/m  Wt Readings from Last 3 Encounters:  01/06/21 175 lb (79.4 kg)  11/24/20 177 lb (80.3 kg)  09/20/20 177 lb (80.3 kg)    Health Maintenance Due  Topic Date Due   Zoster Vaccines- Shingrix (2 of 2) 03/13/2019   COVID-19 Vaccine (4 - Booster for Pfizer series) 02/09/2020    There are no preventive care reminders to display for this patient.   Lab Results  Component Value Date   TSH 1.89 08/23/2020   Lab Results  Component Value Date   WBC 7.1 04/01/2019   HGB 14.8 04/01/2019   HCT 43.8 04/01/2019   MCV 91.8 04/01/2019   PLT 262.0 04/01/2019   Lab Results  Component Value Date   NA 139 08/23/2020   K 4.2 08/23/2020   CO2 27 08/23/2020   GLUCOSE 99 08/23/2020   BUN 10 08/23/2020   CREATININE 0.94 08/23/2020   BILITOT 0.5 08/23/2020   ALKPHOS 97 08/23/2020   AST 23 08/23/2020   ALT 22 08/23/2020   PROT 6.5  08/23/2020   ALBUMIN 4.0 08/23/2020   CALCIUM 9.5 08/23/2020   ANIONGAP 9 10/26/2015   GFR 57.55 (L) 08/23/2020   Lab Results  Component Value Date   CHOL 171 01/21/2020   Lab Results  Component Value Date   HDL 44.10 01/21/2020   Lab Results  Component Value Date   LDLCALC 94 01/21/2020   Lab Results  Component Value Date   TRIG 161.0 (H) 01/21/2020   Lab Results  Component Value Date   CHOLHDL 4 01/21/2020   Lab Results  Component Value Date   HGBA1C 5.4 01/21/2020       Assessment & Plan:   Problem List Items Addressed This Visit       Nervous and Auditory   Otitis media with effusion, bilateral - Primary    Patient presents with fluid behind bilateral ears right worse than left.  Patient has Flonase at home that she has not been using we will encourage her to start using Naprosyn and antihistamine along with information on OMT.  Patient to take medications continue to monitor follow-up if no improvement.      Relevant Medications   levocetirizine (XYZAL) 5 MG tablet     No orders of the defined types were placed in this encounter.  This visit occurred during the  SARS-CoV-2 public health emergency.  Safety protocols were in place, including screening questions prior to the visit, additional usage of staff PPE, and extensive cleaning of exam room while observing appropriate contact time as indicated for disinfecting solutions.   Audria Nine, NP

## 2021-01-22 NOTE — Progress Notes (Signed)
Subjective:   Frances Hoffman is a 80 y.o. female who presents for Medicare Annual (Subsequent) preventive examination.   I connected with Rhea Kaelin today by telephone and verified that I am speaking with the correct person using two identifiers. Location patient: home Location provider: work Persons participating in the virtual visit: patient, Marine scientist.    I discussed the limitations, risks, security and privacy concerns of performing an evaluation and management service by telephone and the availability of in person appointments. I also discussed with the patient that there may be a patient responsible charge related to this service. The patient expressed understanding and verbally consented to this telephonic visit.    Interactive audio and video telecommunications were attempted between this provider and patient, however failed, due to patient having technical difficulties OR patient did not have access to video capability.  We continued and completed visit with audio only.  Some vital signs may be absent or patient reported.   Time Spent with patient on telephone encounter: 25 minutes  Review of Systems     Cardiac Risk Factors include: advanced age (>72men, >72 women);dyslipidemia     Objective:    Today's Vitals   01/23/21 0951  Weight: 175 lb (79.4 kg)  Height: 5\' 4"  (1.626 m)   Body mass index is 30.04 kg/m.  Advanced Directives 01/23/2021 01/21/2020 01/08/2019 02/23/2018 10/26/2015 08/24/2015 02/17/2015  Does Patient Have a Medical Advance Directive? Yes Yes Yes No Yes Yes Yes  Type of Paramedic of [place];Living will Healthcare Power of [place];Living will - Living will [place];Living will Living will  Does patient want to make changes to medical advance directive? Yes (MAU/Ambulatory/Procedural Areas - Information given) - - - No - Patient declined - No - Patient declined  Copy of [place] in Chart? - No - copy requested No - copy requested - No - copy requested - No - copy requested  Would patient like information on creating a medical advance directive? - - - No - Patient declined - - -    Current Medications (verified) Outpatient Encounter Medications as of 01/23/2021  Medication Sig   atorvastatin (LIPITOR) 20 MG tablet TAKE 1 TABLET BY MOUTH AT BEDTIME   b complex vitamins tablet Take 1 tablet by mouth daily.   calcium carbonate (OS-CAL - DOSED IN MG OF ELEMENTAL CALCIUM) 1250 (500 CA) MG tablet Take 1 tablet by mouth daily.   Calcium Citrate (CITRACAL PO) Take by mouth.   carboxymethylcellulose (REFRESH PLUS) 0.5 % SOLN Place 1 drop into both eyes 3 (three) times daily as needed (for dryness).   Cholecalciferol (VITAMIN D3) 2000 UNITS capsule Take 2,000 Units by mouth daily.   Cranberry 125 MG TABS Take 2 tablets by mouth.   cyanocobalamin ([employer] VITAMIN B12) 2000 MCG tablet Take 1 tablet (2,000 mcg total) by mouth daily.   escitalopram (LEXAPRO) 20 MG tablet Take 1 tablet by mouth once daily   EUTHYROX 75 MCG tablet Take 1 tablet by mouth once daily   fluticasone (FLONASE) 50 MCG/ACT nasal spray 1 spray 2 times daily after sinus rinses- Milta Deiters med or AYR   ibuprofen (ADVIL,MOTRIN) 600 MG tablet Take 1 tablet (600 mg total) by mouth every 8 (eight) hours as needed (severer pain/ headache).   loperamide (IMODIUM) 1 MG/5ML solution Take by mouth as needed for diarrhea or loose stools. Teaspoons amounts   memantine (NAMENDA) 10 MG tablet Take 1 tablet (10 mg  total) by mouth 2 (two) times daily.   Multiple Vitamin (MULTIVITAMIN) tablet Take 1 tablet by mouth daily.   Omega-3 Fatty Acids (FISH OIL PO) Take 2 capsules by mouth 2 (two) times daily.   omeprazole (PRILOSEC) 20 MG capsule Take by mouth daily.   vitamin C (ASCORBIC ACID) 500 MG tablet Take 500 mg by mouth daily.   donepezil (ARICEPT) 10 MG tablet Take 1 tablet (10 mg total) by mouth at bedtime.  (Patient not taking: Reported on 01/23/2021)   levocetirizine (XYZAL) 5 MG tablet Take 1 tablet (5 mg total) by mouth every evening for 14 days.   No facility-administered encounter medications on file as of 01/23/2021.    Allergies (verified) Ciprofloxacin, Citalopram hydrobromide, Flagyl [metronidazole], and Oxycodone   History: Past Medical History:  Diagnosis Date   Adjustment disorder with mixed anxiety and depressed mood 12/12/2007   Qualifier: Diagnosis of  By: Copland MD, Spencer     Breast cancer ([place])    Cancer ([place]) 2000   breast had lumpectomy/radiation x36,mammo ,no chemo   Cataract of both eyes    Depression    Dysuria-frequency syndrome    takes AZO   Frozen shoulder    Gall stones    GERD 12/12/2007   Qualifier: Diagnosis of  By: Copland MD, Spencer     Hyperlipidemia    Hypothyroidism 12/12/2007   Qualifier: Diagnosis of  By: Lorelei Pont MD, Spencer     Memory difficulties 09/27/2017   Memory loss    Memory loss 09/22/2014   Osteopenia    BMD 2004, WNL 2008   Personal history of radiation therapy    Pneumonia    Recurrent UTI    Shingles    chronic body pain, left side of body   Shortness of breath dyspnea    when climbing stairs only   Wears glasses    Past Surgical History:  Procedure Laterality Date   BREAST LUMPECTOMY Left 2000   CHOLECYSTECTOMY N/A 02/21/2015   Procedure: LAPAROSCOPIC CHOLECYSTECTOMY WITH INTRAOPERATIVE CHOLANGIOGRAM;  Surgeon: Greer Pickerel, MD;  Location: [facility];  Service: General;  Laterality: N/A;   COLONOSCOPY     MULTIPLE TOOTH EXTRACTIONS     Family History  Problem Relation Age of Onset   Osteoporosis Mother    Parkinsonism Father    Breast cancer Sister 40   Breast cancer Other    Coronary artery disease Sister    Ovarian cancer Sister    Heart disease Maternal Grandmother    Heart disease Maternal Grandfather    Heart attack Maternal Grandfather 62   Heart disease Son    Heart attack Son 2   Social History    Socioeconomic History   Marital status: Married    Spouse name: Merchandiser, retail    Number of children: 2   Years of education: High school   Highest education level: High school graduate  Occupational History   Not on file  Tobacco Use   Smoking status: Never   Smokeless tobacco: Never  Vaping Use   Vaping Use: Never used  Substance and Sexual Activity   Alcohol use: No    Alcohol/week: 0.0 standard drinks   Drug use: No   Sexual activity: Not Currently    Birth control/protection: Post-menopausal  Other Topics Concern   Not on file  Social History Narrative   Lives at home with her husband. [place] Greek   2 adult sons  - [place] and [place] Hiss   2 Grandchildren - both other  Exercise: goes to silver sneakers - at least 2 times a week   Diet: anything that tastes good, less pepsi's with the tongue   Enjoys: square dance, eating out with friends, going to church - Sunday and Wednesday   Social Determinants of Health   Financial Resource Strain: Low Risk    Difficulty of Paying Living Expenses: Not hard at all  Food Insecurity: No Food Insecurity   Worried About Charity fundraiser in the Last Year: Never true   Arboriculturist in the Last Year: Never true  Transportation Needs: No Transportation Needs   Lack of Transportation (Medical): No   Lack of Transportation (Non-Medical): No  Physical Activity: Insufficiently Active   Days of Exercise per Week: 2 days   Minutes of Exercise per Session: 30 min  Stress: No Stress Concern Present   Feeling of Stress : Not at all  Social Connections: Socially Integrated   Frequency of Communication with Friends and Family: Three times a week   Frequency of Social Gatherings with Friends and Family: Three times a week   Attends Religious Services: More than 4 times per year   Active Member of Clubs or Organizations: Yes   Attends Music therapist: More than 4 times per year   Marital Status: Married    Tobacco  Counseling Counseling given: Not Answered   Clinical Intake:  Pre-visit preparation completed: Yes  Pain : No/denies pain     BMI - recorded: 30.02 Nutritional Status: BMI > 30  Obese Nutritional Risks: None Diabetes: No  How often do you need to have someone help you when you read instructions, pamphlets, or other written materials from your doctor or pharmacy?: 1 - Never  Diabetic?No  Interpreter Needed?: No  Information entered by :: Orrin Brigham LPN   Activities of Daily Living In your present state of health, do you have any difficulty performing the following activities: 01/23/2021  Hearing? N  Vision? N  Difficulty concentrating or making decisions? Y  Comment under MD care for dementia  Walking or climbing stairs? N  Dressing or bathing? N  Doing errands, shopping? N  Preparing Food and eating ? N  Using the Toilet? N  In the past six months, have you accidently leaked urine? N  Do you have problems with loss of bowel control? Y  Comment has diarrhea  Managing your Medications? N  Managing your Finances? N  Housekeeping or managing your Housekeeping? N  Some recent data might be hidden    Patient Care Team: [name], MD as PCP - General (Family Medicine)  Indicate any recent Medical Services you may have received from other than [facility] providers in the past year (date may be approximate).     Assessment:   This is a routine wellness examination for Llesenia.  Hearing/Vision screen Hearing Screening - Comments:: No issues Vision Screening - Comments:: Last exam 2022, Dr. Kathlen Mody, reading glasses  Dietary issues and exercise activities discussed: Current Exercise Habits: Structured exercise class, Type of exercise: strength training/weights;stretching;Other - see comments (cardio and stationary bike), Time (Minutes): 30, Frequency (Times/Week): 2, Weekly Exercise (Minutes/Week): 60, Intensity: Mild   Goals Addressed             This  Visit's Progress    Patient Stated       Would like to try and lose weight.        Depression Screen PHQ 2/9 Scores 01/23/2021 01/21/2020 01/21/2020 01/08/2019 02/24/2018 12/31/2017 12/20/2017  PHQ - 2 Score 0 2 2 0 4 1 1   PHQ- 9 Score - 4 - 0 17 6 9     Fall Risk Fall Risk  01/23/2021 01/21/2020 01/21/2020 01/08/2019 04/10/2018  Falls in the past year? 0 0 0 0 0  Number falls in past yr: 0 0 0 0 0  Injury with Fall? 0 0 - 0 0  Risk for fall due to : - No Fall Risks - Medication side effect -  Follow up Falls prevention discussed Falls evaluation completed - Falls evaluation completed;Falls prevention discussed -    FALL RISK PREVENTION PERTAINING TO THE HOME:  Any stairs in or around the home? Yes  If so, are there any without handrails? No  Home free of loose throw rugs in walkways, pet beds, electrical cords, etc? No  Adequate lighting in your home to reduce risk of falls? Yes   ASSISTIVE DEVICES UTILIZED TO PREVENT FALLS:  Life alert? No  Use of a cane, walker or w/c? No  Grab bars in the bathroom? Yes  Shower chair or bench in shower? No  Elevated toilet seat or a handicapped toilet? Yes   TIMED UP AND GO:  Was the test performed? No . , visit completed over the phone.   Cognitive Function: Normal cognitive status assessed by this Nurse Health Advisor. No abnormalities found.   MMSE - Mini Mental State Exam 01/08/2019 03/28/2017 03/26/2017 09/21/2015  Orientation to time 5 4 5 4   Orientation to Place 5 5 5 5   Registration 3 3 3 3   Attention/ Calculation 5 5 2 5   Recall 3 2 1 2   Language- name 2 objects - 2 2 2   Language- repeat 1 1 1 1   Language- follow 3 step command - 3 3 3   Language- read & follow direction - 1 1 1   Write a sentence - 1 1 1   Copy design - 1 1 0  Total score - 28 25 27    Montreal Cognitive Assessment  11/24/2020 08/23/2020  Visuospatial/ Executive (0/5) 5 1  Naming (0/3) 3 2  Attention: Read list of digits (0/2) 2 2  Attention: Read list of  letters (0/1) 1 1  Attention: Serial 7 subtraction starting at 100 (0/3) 3 1  Language: Repeat phrase (0/2) 1 2  Language : Fluency (0/1) 1 0  Abstraction (0/2) 2 2  Delayed Recall (0/5) 1 2  Orientation (0/6) 6 6  Total 25 19  Adjusted Score (based on education) 25 -   6CIT Screen 03/26/2017 11/14/2016  What Year? 0 points 0 points  What month? 0 points 0 points  What time? 0 points 0 points  Count back from 20 0 points 0 points  Months in reverse 4 points 0 points  Repeat phrase 6 points 2 points  Total Score 10 2    Immunizations Immunization History  Administered Date(s) Administered   Fluad Quad(high Dose 65+) 12/15/2019   Influenza Split 01/04/2011   Influenza Whole 01/03/2009, 12/06/2009   Influenza, High Dose Seasonal PF 12/11/2017, 11/27/2018   Influenza,inj,Quad PF,6+ Mos 12/02/2012, 11/30/2014, 11/30/2015   Influenza-Unspecified 12/04/2013   PFIZER(Purple Top)SARS-COV-2 Vaccination 03/24/2019, 04/14/2019, 12/15/2019   PPD Test 12/31/2017   Pneumococcal Conjugate-13 04/28/2014   Pneumococcal Polysaccharide-23 11/26/2008   Td 03/05/2006   Tdap 11/06/2016   Zoster Recombinat (Shingrix) 01/16/2019   Zoster, Live 04/05/2013    TDAP status: Up to date  Flu Vaccine status: Up to date  Pneumococcal vaccine status: Up to date  Covid-19  vaccine status: Information provided on how to obtain vaccines.   Qualifies for Shingles Vaccine? Yes   Zostavax completed Yes   Shingrix Completed first dose was completed 01/16/19  Screening Tests Health Maintenance  Topic Date Due   Zoster Vaccines- Shingrix (2 of 2) 03/13/2019   COVID-19 Vaccine (4 - Booster for Pfizer series) 02/09/2020   MAMMOGRAM  06/17/2021   TETANUS/TDAP  11/07/2026   Pneumonia Vaccine 26+ Years old  Completed   INFLUENZA VACCINE  Completed   DEXA SCAN  Completed   HPV VACCINES  Aged Out    Health Maintenance  Health Maintenance Due  Topic Date Due   Zoster Vaccines- Shingrix (2 of 2)  03/13/2019   COVID-19 Vaccine (4 - Booster for Pfizer series) 02/09/2020    Colorectal cancer screening: No longer required.   Mammogram status: Completed 06/17/20. Repeat every year  Bone density status: due , last completed 12/03/16  Lung Cancer Screening: (Low Dose CT Chest recommended if Age 17-80 years, 30 pack-year currently smoking OR have quit w/in 15years.) does not qualify.     Additional Screening:  Hepatitis C Screening: does not   Vision Screening: Recommended annual ophthalmology exams for early detection of glaucoma and other disorders of the eye. Is the patient up to date with their annual eye exam?  Yes  Who is the provider or what is the name of the office in which the patient attends annual eye exams? Dr Kathlen Mody   Dental Screening: Recommended annual dental exams for proper oral hygiene  Community Resource Referral / Chronic Care Management: CRR required this visit?  No   CCM required this visit?  No      Plan:     I have personally reviewed and noted the following in the patient's chart:   Medical and social history Use of alcohol, tobacco or illicit drugs  Current medications and supplements including opioid prescriptions.  Functional ability and status Nutritional status Physical activity Advanced directives List of other physicians Hospitalizations, surgeries, and ER visits in previous 12 months Vitals Screenings to include cognitive, depression, and falls Referrals and appointments  In addition, I have reviewed and discussed with patient certain preventive protocols, quality metrics, and best practice recommendations. A written personalized care plan for preventive services as well as general preventive health recommendations were provided to patient.   Due to this being a telephonic visit, the after visit summary with patients personalized plan was offered to patient via mail or my-chart. Patient would like to access on my-chart.   Loma Messing, LPN  [id number]   Nurse Health Advisor  Nurse Notes: none

## 2021-01-23 ENCOUNTER — Ambulatory Visit (INDEPENDENT_AMBULATORY_CARE_PROVIDER_SITE_OTHER): Payer: Medicare HMO

## 2021-01-23 VITALS — Ht 64.0 in | Wt 175.0 lb

## 2021-01-23 DIAGNOSIS — Z780 Asymptomatic menopausal state: Secondary | ICD-10-CM | POA: Diagnosis not present

## 2021-01-23 DIAGNOSIS — Z0000 Encounter for general adult medical examination without abnormal findings: Secondary | ICD-10-CM | POA: Diagnosis not present

## 2021-01-23 NOTE — Patient Instructions (Signed)
Frances Hoffman , Thank you for taking time to complete your Medicare Wellness Visit. I appreciate your ongoing commitment to your health goals. Please review the following plan we discussed and let me know if I can assist you in the future.   Screening recommendations/referrals: Colonoscopy: no longer required Mammogram: up to date, completed 06/17/20, due 06/17/21 Bone Density: due, last completed 12/03/16, ordered today someone will call to schedule Recommended yearly ophthalmology/optometry visit for glaucoma screening and checkup Recommended yearly dental visit for hygiene and checkup  Vaccinations: Influenza vaccine: up to date Pneumococcal vaccine: up to date Tdap vaccine: up to date, completed 11/06/16, due 11/07/26 Shingles vaccine: Discuss with  your local pharmacy for 2nd dose  Covid-19: newest booster available at your local pharmacy   Advanced directives: Please bring a copy of Living Will and/or [place] for your chart.   Conditions/risks identified: see problem list  Next appointment: Follow up in one year for your annual wellness visit 01/24/22 @ 9:00am, this will be a telephone visit   Preventive Care 65 Years and Older, Female Preventive care refers to lifestyle choices and visits with your health care provider that can promote health and wellness. What does preventive care include? A yearly physical exam. This is also called an annual well check. Dental exams once or twice a year. Routine eye exams. Ask your health care provider how often you should have your eyes checked. Personal lifestyle choices, including: Daily care of your teeth and gums. Regular physical activity. Eating a healthy diet. Avoiding tobacco and drug use. Limiting alcohol use. Practicing safe sex. Taking low-dose aspirin every day. Taking vitamin and mineral supplements as recommended by your health care provider. What happens during an annual well check? The services and  screenings done by your health care provider during your annual well check will depend on your age, overall health, lifestyle risk factors, and family history of disease. Counseling  Your health care provider may ask you questions about your: Alcohol use. Tobacco use. Drug use. Emotional well-being. Home and relationship well-being. Sexual activity. Eating habits. History of falls. Memory and ability to understand (cognition). Work and work Statistician. Reproductive health. Screening  You may have the following tests or measurements: Height, weight, and BMI. Blood pressure. Lipid and cholesterol levels. These may be checked every 5 years, or more frequently if you are over 68 years old. Skin check. Lung cancer screening. You may have this screening every year starting at age 97 if you have a 30-pack-year history of smoking and currently smoke or have quit within the past 15 years. Fecal occult blood test (FOBT) of the stool. You may have this test every year starting at age 58. Flexible sigmoidoscopy or colonoscopy. You may have a sigmoidoscopy every 5 years or a colonoscopy every 10 years starting at age 65. Hepatitis C blood test. Hepatitis B blood test. Sexually transmitted disease (STD) testing. Diabetes screening. This is done by checking your blood sugar (glucose) after you have not eaten for a while (fasting). You may have this done every 1-3 years. Bone density scan. This is done to screen for osteoporosis. You may have this done starting at age 33. Mammogram. This may be done every 1-2 years. Talk to your health care provider about how often you should have regular mammograms. Talk with your health care provider about your test results, treatment options, and if necessary, the need for more tests. Vaccines  Your health care provider may recommend certain vaccines, such as: Influenza  vaccine. This is recommended every year. Tetanus, diphtheria, and acellular pertussis (Tdap,  Td) vaccine. You may need a Td booster every 10 years. Zoster vaccine. You may need this after age 68. Pneumococcal 13-valent conjugate (PCV13) vaccine. One dose is recommended after age 1. Pneumococcal polysaccharide (PPSV23) vaccine. One dose is recommended after age 56. Talk to your health care provider about which screenings and vaccines you need and how often you need them. This information is not intended to replace advice given to you by your health care provider. Make sure you discuss any questions you have with your health care provider. Document Released: 03/18/2015 Document Revised: 11/09/2015 Document Reviewed: 12/21/2014 Elsevier Interactive Patient Education  2017 [place] Prevention in the Home Falls can cause injuries. They can happen to people of all ages. There are many things you can do to make your home safe and to help prevent falls. What can I do on the outside of my home? Regularly fix the edges of walkways and driveways and fix any cracks. Remove anything that might make you trip as you walk through a door, such as a raised step or threshold. Trim any bushes or trees on the path to your home. Use bright outdoor lighting. Clear any walking paths of anything that might make someone trip, such as rocks or tools. Regularly check to see if handrails are loose or broken. Make sure that both sides of any steps have handrails. Any raised decks and porches should have guardrails on the edges. Have any leaves, snow, or ice cleared regularly. Use sand or salt on walking paths during winter. Clean up any spills in your garage right away. This includes oil or grease spills. What can I do in the bathroom? Use night lights. Install grab bars by the toilet and in the tub and shower. Do not use towel bars as grab bars. Use non-skid mats or decals in the tub or shower. If you need to sit down in the shower, use a plastic, non-slip stool. Keep the floor dry. Clean up any  water that spills on the floor as soon as it happens. Remove soap buildup in the tub or shower regularly. Attach bath mats securely with double-sided non-slip rug tape. Do not have throw rugs and other things on the floor that can make you trip. What can I do in the bedroom? Use night lights. Make sure that you have a light by your bed that is easy to reach. Do not use any sheets or blankets that are too big for your bed. They should not hang down onto the floor. Have a firm chair that has side arms. You can use this for support while you get dressed. Do not have throw rugs and other things on the floor that can make you trip. What can I do in the kitchen? Clean up any spills right away. Avoid walking on wet floors. Keep items that you use a lot in easy-to-reach places. If you need to reach something above you, use a strong step stool that has a grab bar. Keep electrical cords out of the way. Do not use floor polish or wax that makes floors slippery. If you must use wax, use non-skid floor wax. Do not have throw rugs and other things on the floor that can make you trip. What can I do with my stairs? Do not leave any items on the stairs. Make sure that there are handrails on both sides of the stairs and use them. Fix  handrails that are broken or loose. Make sure that handrails are as long as the stairways. Check any carpeting to make sure that it is firmly attached to the stairs. Fix any carpet that is loose or worn. Avoid having throw rugs at the top or bottom of the stairs. If you do have throw rugs, attach them to the floor with carpet tape. Make sure that you have a light switch at the top of the stairs and the bottom of the stairs. If you do not have them, ask someone to add them for you. What else can I do to help prevent falls? Wear shoes that: Do not have high heels. Have rubber bottoms. Are comfortable and fit you well. Are closed at the toe. Do not wear sandals. If you use a  stepladder: Make sure that it is fully opened. Do not climb a closed stepladder. Make sure that both sides of the stepladder are locked into place. Ask someone to hold it for you, if possible. Clearly mark and make sure that you can see: Any grab bars or handrails. First and last steps. Where the edge of each step is. Use tools that help you move around (mobility aids) if they are needed. These include: Canes. Walkers. Scooters. Crutches. Turn on the lights when you go into a dark area. Replace any light bulbs as soon as they burn out. Set up your furniture so you have a clear path. Avoid moving your furniture around. If any of your floors are uneven, fix them. If there are any pets around you, be aware of where they are. Review your medicines with your doctor. Some medicines can make you feel dizzy. This can increase your chance of falling. Ask your doctor what other things that you can do to help prevent falls. This information is not intended to replace advice given to you by your health care provider. Make sure you discuss any questions you have with your health care provider. Document Released: 12/16/2008 Document Revised: 07/28/2015 Document Reviewed: 03/26/2014 Elsevier Interactive Patient Education  2017 [employer].

## 2021-01-24 ENCOUNTER — Encounter: Payer: Medicare HMO | Admitting: Family Medicine

## 2021-01-24 ENCOUNTER — Ambulatory Visit (INDEPENDENT_AMBULATORY_CARE_PROVIDER_SITE_OTHER): Payer: Medicare HMO | Admitting: Family Medicine

## 2021-01-24 ENCOUNTER — Other Ambulatory Visit: Payer: Self-pay

## 2021-01-24 ENCOUNTER — Encounter: Payer: Self-pay | Admitting: Family Medicine

## 2021-01-24 VITALS — BP 110/62 | HR 82 | Temp 97.3°F | Ht 64.0 in | Wt 176.4 lb

## 2021-01-24 DIAGNOSIS — H6980 Other specified disorders of Eustachian tube, unspecified ear: Secondary | ICD-10-CM | POA: Insufficient documentation

## 2021-01-24 DIAGNOSIS — M65331 Trigger finger, right middle finger: Secondary | ICD-10-CM | POA: Diagnosis not present

## 2021-01-24 DIAGNOSIS — H6593 Unspecified nonsuppurative otitis media, bilateral: Secondary | ICD-10-CM

## 2021-01-24 DIAGNOSIS — H6981 Other specified disorders of Eustachian tube, right ear: Secondary | ICD-10-CM

## 2021-01-24 DIAGNOSIS — H6990 Unspecified Eustachian tube disorder, unspecified ear: Secondary | ICD-10-CM | POA: Insufficient documentation

## 2021-01-24 DIAGNOSIS — M19042 Primary osteoarthritis, left hand: Secondary | ICD-10-CM | POA: Diagnosis not present

## 2021-01-24 NOTE — Patient Instructions (Signed)
Get back on flonase nasal spray  Use it twice daily for 4 days  Then once daily for at least 2 weeks   If no improvement- let us know  If any pain or new symptoms let us know also

## 2021-01-24 NOTE — Assessment & Plan Note (Signed)
R ear  Still problematic Not using flonase  inst to use flonase bid for 4 d and then daily for at least 2 weeks If no imp may need a course of prednisone  Update if not starting to improve in a week or if worsening  Will call earlier if ear or sinus pain or other new symptoms

## 2021-01-24 NOTE — Progress Notes (Signed)
Subjective:    Patient ID: Frances Hoffman, female    DOB: [birth year], 80 y.o.   MRN: [id number]  This visit occurred during the SARS-CoV-2 public health emergency.  Safety protocols were in place, including screening questions prior to the visit, additional usage of staff PPE, and extensive cleaning of exam room while observing appropriate contact time as indicated for disinfecting solutions.   HPI 80 yo pt of Dr Selena Batten presents with R ear pain and fullness  Wt Readings from Last 3 Encounters:  01/23/21 175 lb (79.4 kg)  01/06/21 175 lb (79.4 kg)  11/24/20 177 lb (80.3 kg)   30.04 kg/m  She was seen on 11/4 by NP Cable Otitis media with effusion (no infection) Using flonase at home  Recommended adding naproxen and antihistamine   Feels like she has water in her R ear  Does not affect hearing  No pain  No discharge from ears  Not dizzy   Had L ear cleaned out   Uses flonase ns occas-not regularly   Patient Active Problem List   Diagnosis Date Noted   ETD (eustachian tube dysfunction) 01/24/2021   Nail deformity 08/23/2020   Right calf pain 03/14/2020   Stage 3a chronic kidney disease (HCC) 03/14/2020   Irritable bowel syndrome with diarrhea 03/17/2019   Vaginal itching 05/19/2018   B12 deficiency 02/04/2018   Mood disorder (HCC) 02/04/2018   Xerostomia 01/23/2018   Dysgeusia 01/23/2018   Tongue burning sensation 01/16/2018   Weakness 12/31/2017   Hypoxia 12/31/2017   Persistent dry cough 12/20/2017   Chronic sinusitis 12/20/2017   Chronic kidney disease (CKD) stage G3a/A1, moderately decreased glomerular filtration rate (GFR) between 45-59 mL/min/1.73 square meter and albuminuria creatinine ratio less than 30 mg/g (HCC) 11/28/2017   Cough -  2-3 mo  11/28/2017   Environmental and seasonal allergies 11/28/2017   Hearing loss and HA due to cerumen impaction, bilateral 10/10/2017   Vascular dementia without behavioral disturbance (HCC) 09/27/2017   Prediabetes  09/27/2017   Mild cognitive impairment 07/02/2017   Cerebral vascular disease 07/02/2017   Chronic insomnia 07/02/2017   Primary osteoarthritis of first carpometacarpal joint of left hand 01/09/2017   Breast cancer (HCC)- L breast 2000 11/14/2016   Obesity, Class I, BMI 30-34.9 08/19/2016   Leg cramping 02/10/2016   Depression 12/23/2015   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   Asx Hypotension 09/25/2015   Fecal incontinence 07/29/2015   Heme positive stool 07/29/2015   Leg pain 06/06/2015   Routine general medical examination at a health care facility 04/24/2015   Overweight 03/15/2015   Dysuria 02/01/2015   Exercise intolerance 07/30/2014   Estrogen deficiency 04/28/2014   Tachycardia 04/03/2014   Family history of heart disease 08/27/2013   Dyspnea on exertion 08/18/2013   Positional vertigo 08/18/2013   Other screening mammogram 11/23/2011   Dizziness 02/05/2011   Insomnia- due to stress 01/16/2011   Left sided abdominal sub-Q tissue pain of unknown cause/ ( dx as L sided pelvic pain in past) 12/13/2009   Fatigue 08/15/2009   Osteopenia 11/26/2008   Recurrent UTI 01/15/2008   Hypothyroidism 12/12/2007   Hyperlipidemia 12/12/2007   Adjustment disorder with mixed anxiety and depressed mood 12/12/2007   Chronic GERD 12/12/2007   ADHESIVE CAPSULITIS 12/12/2007   Past Medical History:  Diagnosis Date   Adjustment disorder with mixed anxiety and depressed mood 12/12/2007   Qualifier: Diagnosis of  By: Copland MD, Spencer     Breast cancer (HCC)    Cancer (HCC) 2000  breast had lumpectomy/radiation x36,mammo ,no chemo   Cataract of both eyes    Depression    Dysuria-frequency syndrome    takes AZO   Frozen shoulder    Gall stones    GERD 12/12/2007   Qualifier: Diagnosis of  By: Copland MD, Spencer     Hyperlipidemia    Hypothyroidism 12/12/2007   Qualifier: Diagnosis of  By: Patsy Lager MD, Spencer     Memory difficulties 09/27/2017   Memory loss    Memory loss  09/22/2014   Osteopenia    BMD 2004, WNL 2008   Personal history of radiation therapy    Pneumonia    Recurrent UTI    Shingles    chronic body pain, left side of body   Shortness of breath dyspnea    when climbing stairs only   Wears glasses    Past Surgical History:  Procedure Laterality Date   BREAST LUMPECTOMY Left 2000   CHOLECYSTECTOMY N/A 02/21/2015   Procedure: LAPAROSCOPIC CHOLECYSTECTOMY WITH INTRAOPERATIVE CHOLANGIOGRAM;  Surgeon: Gaynelle Adu, MD;  Location: [facility] OR;  Service: General;  Laterality: N/A;   COLONOSCOPY     MULTIPLE TOOTH EXTRACTIONS     Social History   Tobacco Use   Smoking status: Never   Smokeless tobacco: Never  Vaping Use   Vaping Use: Never used  Substance Use Topics   Alcohol use: No    Alcohol/week: 0.0 standard drinks   Drug use: No   Family History  Problem Relation Age of Onset   Osteoporosis Mother    Parkinsonism Father    Breast cancer Sister 33   Breast cancer Other    Coronary artery disease Sister    Ovarian cancer Sister    Heart disease Maternal Grandmother    Heart disease Maternal Grandfather    Heart attack Maternal Grandfather 50   Heart disease Son    Heart attack Son 32   Allergies  Allergen Reactions   Ciprofloxacin Other (See Comments)    Unknown (vertigo??)   Citalopram Hydrobromide Other (See Comments)    Intrusive/ odd thoughts    Flagyl [Metronidazole] Other (See Comments)    Unknown??   Oxycodone Other (See Comments)    Headaches   Current Outpatient Medications on File Prior to Visit  Medication Sig Dispense Refill   atorvastatin (LIPITOR) 20 MG tablet TAKE 1 TABLET BY MOUTH AT BEDTIME 90 tablet 3   b complex vitamins tablet Take 1 tablet by mouth daily.     calcium carbonate (OS-CAL - DOSED IN MG OF ELEMENTAL CALCIUM) 1250 (500 CA) MG tablet Take 1 tablet by mouth daily.     Calcium Citrate (CITRACAL PO) Take by mouth.     carboxymethylcellulose (REFRESH PLUS) 0.5 % SOLN Place 1 drop into both eyes  3 (three) times daily as needed (for dryness).     Cholecalciferol (VITAMIN D3) 2000 UNITS capsule Take 2,000 Units by mouth daily.     Cranberry 125 MG TABS Take 2 tablets by mouth.     cyanocobalamin ([employer] VITAMIN B12) 2000 MCG tablet Take 1 tablet (2,000 mcg total) by mouth daily.     donepezil (ARICEPT) 10 MG tablet Take 1 tablet (10 mg total) by mouth at bedtime. 90 tablet 3   escitalopram (LEXAPRO) 20 MG tablet Take 1 tablet by mouth once daily 90 tablet 1   EUTHYROX 75 MCG tablet Take 1 tablet by mouth once daily 90 tablet 1   fluticasone (FLONASE) 50 MCG/ACT nasal spray 1 spray 2 times  daily after sinus rinses- Lloyd Huger med or AYR 16 g 6   ibuprofen (ADVIL,MOTRIN) 600 MG tablet Take 1 tablet (600 mg total) by mouth every 8 (eight) hours as needed (severer pain/ headache). 10 tablet 0   loperamide (IMODIUM) 1 MG/5ML solution Take by mouth as needed for diarrhea or loose stools. Teaspoons amounts     memantine (NAMENDA) 10 MG tablet Take 1 tablet (10 mg total) by mouth 2 (two) times daily. 60 tablet 11   Multiple Vitamin (MULTIVITAMIN) tablet Take 1 tablet by mouth daily.     Omega-3 Fatty Acids (FISH OIL PO) Take 2 capsules by mouth 2 (two) times daily.     omeprazole (PRILOSEC) 20 MG capsule Take by mouth daily.     vitamin C (ASCORBIC ACID) 500 MG tablet Take 500 mg by mouth daily.     levocetirizine (XYZAL) 5 MG tablet Take 1 tablet (5 mg total) by mouth every evening for 14 days. 14 tablet 0   No current facility-administered medications on file prior to visit.     Review of Systems  Constitutional:  Negative for activity change, appetite change, fatigue, fever and unexpected weight change.  HENT:  Positive for rhinorrhea. Negative for congestion, ear discharge, ear pain, facial swelling, sinus pressure, sinus pain, sore throat and tinnitus.   Eyes:  Negative for pain, redness and visual disturbance.  Respiratory:  Negative for cough, shortness of breath and wheezing.   Cardiovascular:   Negative for chest pain and palpitations.  Gastrointestinal:  Negative for abdominal pain, blood in stool, constipation and diarrhea.  Endocrine: Negative for polydipsia and polyuria.  Genitourinary:  Negative for dysuria, frequency and urgency.  Musculoskeletal:  Negative for arthralgias, back pain and myalgias.  Skin:  Negative for pallor and rash.  Allergic/Immunologic: Negative for environmental allergies.  Neurological:  Negative for dizziness, syncope and headaches.  Hematological:  Negative for adenopathy. Does not bruise/bleed easily.  Psychiatric/Behavioral:  Negative for decreased concentration and dysphoric mood. The patient is not nervous/anxious.       Objective:   Physical Exam Constitutional:      General: She is not in acute distress.    Appearance: Normal appearance. She is obese. She is not ill-appearing.  HENT:     Head: Normocephalic.     Comments: No sinus tenderness    Left Ear: Tympanic membrane, ear canal and external ear normal. There is no impacted cerumen.     Ears:     Comments: Small effusion R ear  No bulging or scarring    Nose: Congestion present.     Comments: Boggy Clear mucous    Mouth/Throat:     Mouth: Mucous membranes are moist.     Pharynx: No oropharyngeal exudate or posterior oropharyngeal erythema.  Eyes:     General:        Right eye: No discharge.        Left eye: No discharge.     Extraocular Movements: Extraocular movements intact.     Conjunctiva/sclera: Conjunctivae normal.     Pupils: Pupils are equal, round, and reactive to light.  Cardiovascular:     Rate and Rhythm: Normal rate and regular rhythm.     Heart sounds: Normal heart sounds.  Pulmonary:     Effort: Pulmonary effort is normal. No respiratory distress.     Breath sounds: Normal breath sounds. No wheezing.  Musculoskeletal:     Cervical back: Neck supple.  Lymphadenopathy:     Cervical: No cervical adenopathy.  Neurological:  Mental Status: She is alert.      Cranial Nerves: No cranial nerve deficit.  Psychiatric:        Mood and Affect: Mood normal.          Assessment & Plan:   Problem List Items Addressed This Visit       Nervous and Auditory   ETD (eustachian tube dysfunction)    R ear  Still problematic Not using flonase  inst to use flonase bid for 4 d and then daily for at least 2 weeks If no imp may need a course of prednisone  Update if not starting to improve in a week or if worsening  Will call earlier if ear or sinus pain or other new symptoms       RESOLVED: Otitis media with effusion, bilateral - Primary

## 2021-02-01 DIAGNOSIS — N3020 Other chronic cystitis without hematuria: Secondary | ICD-10-CM | POA: Diagnosis not present

## 2021-02-01 DIAGNOSIS — N390 Urinary tract infection, site not specified: Secondary | ICD-10-CM | POA: Diagnosis not present

## 2021-02-09 ENCOUNTER — Encounter: Payer: Medicare HMO | Admitting: Primary Care

## 2021-02-11 ENCOUNTER — Other Ambulatory Visit: Payer: Self-pay | Admitting: Family Medicine

## 2021-02-11 DIAGNOSIS — E039 Hypothyroidism, unspecified: Secondary | ICD-10-CM

## 2021-02-16 ENCOUNTER — Ambulatory Visit (INDEPENDENT_AMBULATORY_CARE_PROVIDER_SITE_OTHER): Payer: Medicare HMO | Admitting: Primary Care

## 2021-02-16 ENCOUNTER — Other Ambulatory Visit: Payer: Self-pay

## 2021-02-16 ENCOUNTER — Encounter: Payer: Self-pay | Admitting: Primary Care

## 2021-02-16 VITALS — BP 100/58 | HR 72 | Temp 97.3°F | Ht 64.0 in | Wt 174.0 lb

## 2021-02-16 DIAGNOSIS — K219 Gastro-esophageal reflux disease without esophagitis: Secondary | ICD-10-CM | POA: Diagnosis not present

## 2021-02-16 DIAGNOSIS — K580 Irritable bowel syndrome with diarrhea: Secondary | ICD-10-CM | POA: Diagnosis not present

## 2021-02-16 DIAGNOSIS — N1831 Chronic kidney disease, stage 3a: Secondary | ICD-10-CM

## 2021-02-16 DIAGNOSIS — Z0000 Encounter for general adult medical examination without abnormal findings: Secondary | ICD-10-CM

## 2021-02-16 DIAGNOSIS — F4323 Adjustment disorder with mixed anxiety and depressed mood: Secondary | ICD-10-CM

## 2021-02-16 DIAGNOSIS — M8580 Other specified disorders of bone density and structure, unspecified site: Secondary | ICD-10-CM

## 2021-02-16 DIAGNOSIS — R7303 Prediabetes: Secondary | ICD-10-CM | POA: Diagnosis not present

## 2021-02-16 DIAGNOSIS — F0150 Vascular dementia without behavioral disturbance: Secondary | ICD-10-CM | POA: Diagnosis not present

## 2021-02-16 DIAGNOSIS — Z853 Personal history of malignant neoplasm of breast: Secondary | ICD-10-CM

## 2021-02-16 DIAGNOSIS — E7849 Other hyperlipidemia: Secondary | ICD-10-CM | POA: Diagnosis not present

## 2021-02-16 DIAGNOSIS — E2839 Other primary ovarian failure: Secondary | ICD-10-CM

## 2021-02-16 DIAGNOSIS — E039 Hypothyroidism, unspecified: Secondary | ICD-10-CM

## 2021-02-16 DIAGNOSIS — F5104 Psychophysiologic insomnia: Secondary | ICD-10-CM

## 2021-02-16 DIAGNOSIS — E538 Deficiency of other specified B group vitamins: Secondary | ICD-10-CM

## 2021-02-16 DIAGNOSIS — F3289 Other specified depressive episodes: Secondary | ICD-10-CM

## 2021-02-16 DIAGNOSIS — R69 Illness, unspecified: Secondary | ICD-10-CM | POA: Diagnosis not present

## 2021-02-16 LAB — LIPID PANEL
Cholesterol: 195 mg/dL (ref 0–200)
HDL: 55.1 mg/dL (ref >39.00)
LDL Cholesterol: 112 mg/dL — ABNORMAL HIGH (ref 0–99)
NonHDL: 139.51
Total CHOL/HDL Ratio: 4
Triglycerides: 139 mg/dL (ref 0.0–149.0)
VLDL: 27.8 mg/dL (ref 0.0–40.0)

## 2021-02-16 LAB — HEMOGLOBIN A1C: Hgb A1c MFr Bld: 5.9 % (ref 4.6–6.5)

## 2021-02-16 NOTE — Assessment & Plan Note (Signed)
Appears stable on Lexapro 20 mg, memantine 10 mg BID, Aricept 10 mg.  Continue same.

## 2021-02-16 NOTE — Assessment & Plan Note (Signed)
Repeat A1C pending. 

## 2021-02-16 NOTE — Progress Notes (Signed)
Subjective:    Patient ID: Frances Hoffman, female    DOB: June 16, 1940, 80 y.o.   MRN: [id number]  HPI  Frances Hoffman is a very pleasant 80 y.o. female patient of Dr. Selena Batten who presents today for complete physical and follow up of chronic conditions.  Immunizations: -Tetanus: 2018 -Influenza: Completed this season -Covid-19: 4 vaccines  -Shingles: Shingrix and Zostavax -Pneumonia: Prevnar 13 in 2016, Pneumovax in 2010  Diet: Fair diet.  Exercise: Exercises at the gym twice weekly, sometimes rides her bike at home.   Eye exam: Completes annually  Dental exam: Completes semi-annually   Mammogram: Completed in April 2022 Dexa: Completed in 2018 Colonoscopy: Completed in 2020, no further imaging needed given age.  BP Readings from Last 3 Encounters:  02/16/21 (!) 100/58  01/24/21 110/62  01/06/21 96/64       Review of Systems  Constitutional:  Negative for unexpected weight change.  HENT:  Negative for rhinorrhea.   Respiratory:  Negative for shortness of breath.   Cardiovascular:  Negative for chest pain.  Gastrointestinal:  Negative for constipation and diarrhea.  Genitourinary:  Negative for difficulty urinating.  Musculoskeletal:  Negative for arthralgias.  Skin:  Negative for rash.  Allergic/Immunologic: Positive for environmental allergies.  Neurological:  Negative for dizziness and headaches.  Psychiatric/Behavioral:  The patient is nervous/anxious.         Past Medical History:  Diagnosis Date   Adjustment disorder with mixed anxiety and depressed mood 12/12/2007   Qualifier: Diagnosis of  By: Copland MD, Spencer     Breast cancer (HCC)    Cancer (HCC) 2000   breast had lumpectomy/radiation x36,mammo ,no chemo   Cataract of both eyes    Depression    Dysuria-frequency syndrome    takes AZO   Frozen shoulder    Gall stones    GERD 12/12/2007   Qualifier: Diagnosis of  By: Copland MD, Spencer     Hyperlipidemia    Hypothyroidism 12/12/2007    Qualifier: Diagnosis of  By: Patsy Lager MD, Spencer     Memory difficulties 09/27/2017   Memory loss    Memory loss 09/22/2014   Osteopenia    BMD 2004, WNL 2008   Personal history of radiation therapy    Pneumonia    Recurrent UTI    Shingles    chronic body pain, left side of body   Shortness of breath dyspnea    when climbing stairs only   Wears glasses     Social History   Socioeconomic History   Marital status: Married    Spouse name: Financial planner    Number of children: 2   Years of education: High school   Highest education level: High school graduate  Occupational History   Not on file  Tobacco Use   Smoking status: Never   Smokeless tobacco: Never  Vaping Use   Vaping Use: Never used  Substance and Sexual Activity   Alcohol use: No    Alcohol/week: 0.0 standard drinks   Drug use: No   Sexual activity: Not Currently    Birth control/protection: Post-menopausal  Other Topics Concern   Not on file  Social History Narrative   Lives at home with her husband. [name]   2 adult sons  - [place] and Minerva Areola   2 Grandchildren - both other   Exercise: goes to [employer] sne[employer] - at least 2 times a week   Diet: anything that tastes good, less pepsi's with the tongue  Enjoys: square dance, eating out with friends, going to church - Sunday and Wednesday   Social Determinants of Health   Financial Resource Strain: Low Risk    Difficulty of Paying Living Expenses: Not hard at all  Food Insecurity: No Food Insecurity   Worried About Programme researcher, broadcasting/film/video in the Last Year: Never true   Barista in the Last Year: Never true  Transportation Needs: No Transportation Needs   Lack of Transportation (Medical): No   Lack of Transportation (Non-Medical): No  Physical Activity: Insufficiently Active   Days of Exercise per Week: 2 days   Minutes of Exercise per Session: 30 min  Stress: No Stress Concern Present   Feeling of Stress : Not at all  Social Connections: Socially Integrated    Frequency of Communication with Friends and Family: Three times a week   Frequency of Social Gatherings with Friends and Family: Three times a week   Attends Religious Services: More than 4 times per year   Active Member of Clubs or Organizations: Yes   Attends Engineer, structural: More than 4 times per year   Marital Status: Married  Catering manager Violence: Not At Risk   Fear of Current or Ex-Partner: No   Emotionally Abused: No   Physically Abused: No   Sexually Abused: No    Past Surgical History:  Procedure Laterality Date   BREAST LUMPECTOMY Left 2000   CHOLECYSTECTOMY N/A 02/21/2015   Procedure: LAPAROSCOPIC CHOLECYSTECTOMY WITH INTRAOPERATIVE CHOLANGIOGRAM;  Surgeon: Gaynelle Adu, MD;  Location: [facility] OR;  Service: General;  Laterality: N/A;   COLONOSCOPY     MULTIPLE TOOTH EXTRACTIONS      Family History  Problem Relation Age of Onset   Osteoporosis Mother    Parkinsonism Father    Breast cancer Sister 95   Breast cancer Other    Coronary artery disease Sister    Ovarian cancer Sister    Heart disease Maternal Grandmother    Heart disease Maternal Grandfather    Heart attack Maternal Grandfather 50   Heart disease Son    Heart attack Son 38    Allergies  Allergen Reactions   Ciprofloxacin Other (See Comments)    Unknown (vertigo??)   Citalopram Hydrobromide Other (See Comments)    Intrusive/ odd thoughts    Flagyl [Metronidazole] Other (See Comments)    Unknown??   Oxycodone Other (See Comments)    Headaches    Current Outpatient Medications on File Prior to Visit  Medication Sig Dispense Refill   atorvastatin (LIPITOR) 20 MG tablet TAKE 1 TABLET BY MOUTH AT BEDTIME 90 tablet 3   b complex vitamins tablet Take 1 tablet by mouth daily.     calcium carbonate (OS-CAL - DOSED IN MG OF ELEMENTAL CALCIUM) 1250 (500 CA) MG tablet Take 1 tablet by mouth daily.     Calcium Citrate (CITRACAL PO) Take by mouth.     carboxymethylcellulose (REFRESH PLUS)  0.5 % SOLN Place 1 drop into both eyes 3 (three) times daily as needed (for dryness).     Cholecalciferol (VITAMIN D3) 2000 UNITS capsule Take 2,000 Units by mouth daily.     Cranberry 125 MG TABS Take 2 tablets by mouth.     cyanocobalamin ([employer] VITAMIN B12) 2000 MCG tablet Take 1 tablet (2,000 mcg total) by mouth daily.     escitalopram (LEXAPRO) 20 MG tablet Take 1 tablet by mouth once daily 90 tablet 1   EUTHYROX 75 MCG tablet Take 1  tablet by mouth once daily 90 tablet 1   fluticasone (FLONASE) 50 MCG/ACT nasal spray 1 spray 2 times daily after sinus rinses- Neil med or AYR 16 g 6   ibuprofen (ADVIL,MOTRIN) 600 MG tablet Take 1 tablet (600 mg total) by mouth every 8 (eight) hours as needed (severer pain/ headache). 10 tablet 0   loperamide (IMODIUM) 1 MG/5ML solution Take by mouth as needed for diarrhea or loose stools. Teaspoons amounts     memantine (NAMENDA) 10 MG tablet Take 1 tablet (10 mg total) by mouth 2 (two) times daily. 60 tablet 11   Multiple Vitamin (MULTIVITAMIN) tablet Take 1 tablet by mouth daily.     Omega-3 Fatty Acids (FISH OIL PO) Take 2 capsules by mouth 2 (two) times daily.     omeprazole (PRILOSEC) 20 MG capsule Take by mouth daily.     vitamin C (ASCORBIC ACID) 500 MG tablet Take 500 mg by mouth daily.     donepezil (ARICEPT) 10 MG tablet Take 1 tablet (10 mg total) by mouth at bedtime. (Patient not taking: Reported on 02/16/2021) 90 tablet 3   No current facility-administered medications on file prior to visit.    BP (!) 100/58    Pulse 72    Temp (!) 97.3 F (36.3 C) (Temporal)    Ht 5\' 4"  (1.626 m)    Wt 174 lb (78.9 kg)    SpO2 94%    BMI 29.87 kg/m  Objective:   Physical Exam HENT:     Right Ear: Tympanic membrane and ear canal normal.     Left Ear: Tympanic membrane and ear canal normal.     Nose: Nose normal.  Eyes:     Conjunctiva/sclera: Conjunctivae normal.     Pupils: Pupils are equal, round, and reactive to light.  Neck:     Thyroid: No  thyromegaly.  Cardiovascular:     Rate and Rhythm: Normal rate and regular rhythm.     Heart sounds: No murmur heard. Pulmonary:     Effort: Pulmonary effort is normal.     Breath sounds: Normal breath sounds. No rales.  Abdominal:     General: Bowel sounds are normal.     Palpations: Abdomen is soft.     Tenderness: There is no abdominal tenderness.  Musculoskeletal:        General: Normal range of motion.     Cervical back: Neck supple.  Lymphadenopathy:     Cervical: No cervical adenopathy.  Skin:    General: Skin is warm and dry.     Findings: No rash.  Neurological:     Mental Status: She is alert and oriented to person, place, and time.     Cranial Nerves: No cranial nerve deficit.     Deep Tendon Reflexes: Reflexes are normal and symmetric.  Psychiatric:        Mood and Affect: Mood normal.          Assessment & Plan:      This visit occurred during the SARS-CoV-2 public health emergency.  Safety protocols were in place, including screening questions prior to the visit, additional usage of staff PPE, and extensive cleaning of exam room while observing appropriate contact time as indicated for disinfecting solutions.

## 2021-02-16 NOTE — Assessment & Plan Note (Signed)
Compliant to B12 supplement. B12 lab from June 2022 reviewed.

## 2021-02-16 NOTE — Assessment & Plan Note (Signed)
Improved on labs from June 2022. Continue to monitor.

## 2021-02-16 NOTE — Assessment & Plan Note (Signed)
Compliant to atorvastatin 20 mg, continue same. Repeat lipid panel pending.

## 2021-02-16 NOTE — Assessment & Plan Note (Signed)
Compliant to calcium and vitamin D daily, continue same. Commended her on weight bearing exercise.  Due for bone density scan, ordered and pending.

## 2021-02-16 NOTE — Patient Instructions (Signed)
[facility] to [facility] your [facility].  Stop by the lab prior to leaving today. I will notify you of your results once received.   It was a pleasure meeting you!  Preventive Care 28 Years and Older, Female Preventive care refers to lifestyle choices and visits with your health care provider that can promote health and wellness. Preventive care visits are also called wellness exams. What can I expect for my preventive care visit? Counseling Your health care provider may ask you questions about your: Medical history, including: Past medical problems. Family medical history. Pregnancy and menstrual history. History of falls. Current health, including: Memory and ability to understand (cognition). Emotional well-being. Home life and relationship well-being. Sexual activity and sexual health. Lifestyle, including: Alcohol, nicotine or tobacco, and drug use. Access to firearms. Diet, exercise, and sleep habits. Work and work Statistician. Sunscreen use. Safety issues such as seatbelt and bike helmet use. Physical exam Your health care provider will check your: Height and weight. These may be used to calculate your BMI (body mass index). BMI is a measurement that tells if you are at a healthy weight. Waist circumference. This measures the distance around your waistline. This measurement also tells if you are at a healthy weight and may help predict your risk of certain diseases, such as type 2 diabetes and high blood pressure. Heart rate and blood pressure. Body temperature. Skin for abnormal spots. What immunizations do I need? Vaccines are usually given at various ages, according to a schedule. Your health care provider will recommend vaccines for you based on your age, medical history, and lifestyle or other factors, such as travel or where you work. What tests do I need? Screening Your health care provider may recommend screening tests for certain conditions.  This may include: Lipid and cholesterol levels. Hepatitis C test. Hepatitis B test. HIV (human immunodeficiency virus) test. STI (sexually transmitted infection) testing, if you are at risk. Lung cancer screening. Colorectal cancer screening. Diabetes screening. This is done by checking your blood sugar (glucose) after you have not eaten for a while (fasting). Mammogram. Talk with your health care provider about how often you should have regular mammograms. BRCA-related cancer screening. This may be done if you have a family history of breast, ovarian, tubal, or peritoneal cancers. Bone density scan. This is done to screen for osteoporosis. Talk with your health care provider about your test results, treatment options, and if necessary, the need for more tests. Follow these instructions at home: Eating and drinking  Eat a diet that includes fresh fruits and vegetables, whole grains, lean protein, and low-fat dairy products. Limit your intake of foods with high amounts of sugar, saturated fats, and salt. Take vitamin and mineral supplements as recommended by your health care provider. Do not drink alcohol if your health care provider tells you not to drink. If you drink alcohol: Limit how much you have to 0-1 drink a day. Know how much alcohol is in your drink. In the [place] one drink equals one 12 oz bottle of beer (355 mL), one 5 oz glass of wine (148 mL), or one 1 oz glass of hard liquor (44 mL). Lifestyle Brush your teeth every morning and night with fluoride toothpaste. Floss one time each day. Exercise for at least 30 minutes 5 or more days each week. Do not use any products that contain nicotine or tobacco. These products include cigarettes, chewing tobacco, and vaping devices, such as e-cigarettes. If you need help quitting,  ask your health care provider. Do not use drugs. If you are sexually active, practice safe sex. Use a condom or other form of protection in order to prevent  STIs. Take aspirin only as told by your health care provider. Make sure that you understand how much to take and what form to take. Work with your health care provider to find out whether it is safe and beneficial for you to take aspirin daily. Ask your health care provider if you need to take a cholesterol-lowering medicine (statin). Find healthy ways to manage stress, such as: Meditation, yoga, or listening to music. Journaling. Talking to a trusted person. Spending time with friends and family. Minimize exposure to UV radiation to reduce your risk of skin cancer. Safety Always wear your seat belt while driving or riding in a vehicle. Do not drive: If you have been drinking alcohol. Do not ride with someone who has been drinking. When you are tired or distracted. While texting. If you have been using any mind-altering substances or drugs. Wear a helmet and other protective equipment during sports activities. If you have firearms in your house, make sure you follow all gun safety procedures. What's next? Visit your health care provider once a year for an annual wellness visit. Ask your health care provider how often you should have your eyes and teeth checked. Stay up to date on all vaccines. This information is not intended to replace advice given to you by your health care provider. Make sure you discuss any questions you have with your health care provider. Document Revised: 08/17/2020 Document Reviewed: 08/17/2020 Elsevier Patient Education  [place].

## 2021-02-16 NOTE — Assessment & Plan Note (Signed)
No concerns today. Continue Lexapro 20 mg, memantine 10 mg BID, Aricept 10 mg.

## 2021-02-16 NOTE — Assessment & Plan Note (Signed)
Doing well on omeprazole 20 mg daily, continue same.

## 2021-02-16 NOTE — Assessment & Plan Note (Signed)
No concerns today.  Continue Lexapro 20 mg daily.

## 2021-02-16 NOTE — Assessment & Plan Note (Signed)
She is taking Euthyrox 75 mcg correctly. Continue same. TSH from June 2022 reviewed and is stable.

## 2021-02-16 NOTE — Assessment & Plan Note (Signed)
Following with neurology, Dr. Krista Blue, office notes from September 2022 reviewed,  Continue memantine 10 mg BID, Aricept 10 mg daily. Discussed with patient today.

## 2021-02-16 NOTE — Assessment & Plan Note (Addendum)
Stable per patient, infrequent use of Imodium PRN. Continue to monitor.

## 2021-02-16 NOTE — Assessment & Plan Note (Signed)
Immunizations UTD. Mammogram UTD, history of breast cancer. Colonoscopy UTD, no longer needs monitoring given age. Bone density scan due and pending.  Discussed the importance of a healthy diet and regular exercise in order for weight loss, and to reduce the risk of further co-morbidity.  Exam today stable. Labs reviewed from June 2022 in chart.

## 2021-02-16 NOTE — Assessment & Plan Note (Signed)
Mammogram UTD and reviewed from April 2022.

## 2021-02-20 ENCOUNTER — Ambulatory Visit
Admission: RE | Admit: 2021-02-20 | Discharge: 2021-02-20 | Disposition: A | Discharge status: Home / Self Care | Payer: Medicare HMO | Source: Ambulatory Visit | Attending: Primary Care | Admitting: Primary Care

## 2021-02-20 DIAGNOSIS — E2839 Other primary ovarian failure: Secondary | ICD-10-CM

## 2021-02-20 DIAGNOSIS — M85851 Other specified disorders of bone density and structure, right thigh: Secondary | ICD-10-CM | POA: Diagnosis not present

## 2021-02-20 DIAGNOSIS — Z780 Asymptomatic menopausal state: Secondary | ICD-10-CM | POA: Diagnosis not present

## 2021-02-21 ENCOUNTER — Other Ambulatory Visit: Payer: Self-pay | Admitting: Primary Care

## 2021-02-21 DIAGNOSIS — E7849 Other hyperlipidemia: Secondary | ICD-10-CM

## 2021-02-21 MED ORDER — ATORVASTATIN CALCIUM 40 MG PO TABS
40.0000 mg | ORAL_TABLET | Freq: Every day | ORAL | 0 refills | Status: DC
Start: 2021-02-21 — End: 2021-04-19

## 2021-03-27 DIAGNOSIS — E785 Hyperlipidemia, unspecified: Secondary | ICD-10-CM | POA: Diagnosis not present

## 2021-03-27 DIAGNOSIS — G8929 Other chronic pain: Secondary | ICD-10-CM | POA: Diagnosis not present

## 2021-03-27 DIAGNOSIS — Z008 Encounter for other general examination: Secondary | ICD-10-CM | POA: Diagnosis not present

## 2021-03-27 DIAGNOSIS — E669 Obesity, unspecified: Secondary | ICD-10-CM | POA: Diagnosis not present

## 2021-03-27 DIAGNOSIS — J309 Allergic rhinitis, unspecified: Secondary | ICD-10-CM | POA: Diagnosis not present

## 2021-03-27 DIAGNOSIS — K580 Irritable bowel syndrome with diarrhea: Secondary | ICD-10-CM | POA: Diagnosis not present

## 2021-03-27 DIAGNOSIS — M1990 Unspecified osteoarthritis, unspecified site: Secondary | ICD-10-CM | POA: Diagnosis not present

## 2021-03-27 DIAGNOSIS — I739 Peripheral vascular disease, unspecified: Secondary | ICD-10-CM | POA: Diagnosis not present

## 2021-03-27 DIAGNOSIS — K219 Gastro-esophageal reflux disease without esophagitis: Secondary | ICD-10-CM | POA: Diagnosis not present

## 2021-03-27 DIAGNOSIS — E039 Hypothyroidism, unspecified: Secondary | ICD-10-CM | POA: Diagnosis not present

## 2021-03-27 DIAGNOSIS — R69 Illness, unspecified: Secondary | ICD-10-CM | POA: Diagnosis not present

## 2021-04-15 ENCOUNTER — Other Ambulatory Visit: Payer: Self-pay | Admitting: Family Medicine

## 2021-04-15 DIAGNOSIS — F39 Unspecified mood [affective] disorder: Secondary | ICD-10-CM

## 2021-04-17 ENCOUNTER — Encounter (HOSPITAL_COMMUNITY): Payer: Self-pay

## 2021-04-17 ENCOUNTER — Observation Stay (HOSPITAL_COMMUNITY)
Admission: EM | Admit: 2021-04-17 | Discharge: 2021-04-18 | Disposition: A | Discharge status: Home / Self Care | Payer: Medicare HMO | Attending: Internal Medicine | Admitting: Internal Medicine

## 2021-04-17 ENCOUNTER — Observation Stay (HOSPITAL_COMMUNITY): Payer: Medicare HMO

## 2021-04-17 ENCOUNTER — Other Ambulatory Visit: Payer: Self-pay

## 2021-04-17 ENCOUNTER — Emergency Department (HOSPITAL_COMMUNITY): Payer: Medicare HMO

## 2021-04-17 ENCOUNTER — Telehealth: Payer: Self-pay

## 2021-04-17 DIAGNOSIS — Z743 Need for continuous supervision: Secondary | ICD-10-CM | POA: Diagnosis not present

## 2021-04-17 DIAGNOSIS — Z853 Personal history of malignant neoplasm of breast: Secondary | ICD-10-CM | POA: Diagnosis not present

## 2021-04-17 DIAGNOSIS — R1030 Lower abdominal pain, unspecified: Secondary | ICD-10-CM | POA: Diagnosis present

## 2021-04-17 DIAGNOSIS — R000 Tachycardia, unspecified: Secondary | ICD-10-CM | POA: Diagnosis not present

## 2021-04-17 DIAGNOSIS — N390 Urinary tract infection, site not specified: Secondary | ICD-10-CM | POA: Diagnosis not present

## 2021-04-17 DIAGNOSIS — Z20822 Contact with and (suspected) exposure to covid-19: Secondary | ICD-10-CM | POA: Diagnosis not present

## 2021-04-17 DIAGNOSIS — F0390 Unspecified dementia without behavioral disturbance: Secondary | ICD-10-CM | POA: Diagnosis not present

## 2021-04-17 DIAGNOSIS — K7689 Other specified diseases of liver: Secondary | ICD-10-CM | POA: Diagnosis not present

## 2021-04-17 DIAGNOSIS — A419 Sepsis, unspecified organism: Secondary | ICD-10-CM

## 2021-04-17 DIAGNOSIS — R2681 Unsteadiness on feet: Secondary | ICD-10-CM | POA: Insufficient documentation

## 2021-04-17 DIAGNOSIS — E039 Hypothyroidism, unspecified: Secondary | ICD-10-CM | POA: Insufficient documentation

## 2021-04-17 DIAGNOSIS — Z79899 Other long term (current) drug therapy: Secondary | ICD-10-CM | POA: Insufficient documentation

## 2021-04-17 DIAGNOSIS — R0902 Hypoxemia: Secondary | ICD-10-CM | POA: Diagnosis not present

## 2021-04-17 DIAGNOSIS — R531 Weakness: Secondary | ICD-10-CM | POA: Diagnosis not present

## 2021-04-17 DIAGNOSIS — R69 Illness, unspecified: Secondary | ICD-10-CM | POA: Diagnosis not present

## 2021-04-17 DIAGNOSIS — N3000 Acute cystitis without hematuria: Secondary | ICD-10-CM | POA: Diagnosis not present

## 2021-04-17 DIAGNOSIS — R7989 Other specified abnormal findings of blood chemistry: Secondary | ICD-10-CM

## 2021-04-17 DIAGNOSIS — R509 Fever, unspecified: Secondary | ICD-10-CM | POA: Diagnosis not present

## 2021-04-17 LAB — CBC WITH DIFFERENTIAL/PLATELET
Abs Immature Granulocytes: 0.08 10*3/uL — ABNORMAL HIGH (ref 0.00–0.07)
Basophils Absolute: 0 10*3/uL (ref 0.0–0.1)
Basophils Relative: 0 %
Eosinophils Absolute: 0 10*3/uL (ref 0.0–0.5)
Eosinophils Relative: 0 %
HCT: 41.7 % (ref 36.0–46.0)
Hemoglobin: 14.1 g/dL (ref 12.0–15.0)
Immature Granulocytes: 1 %
Lymphocytes Relative: 9 %
Lymphs Abs: 1.3 10*3/uL (ref 0.7–4.0)
MCH: 31.6 pg (ref 26.0–34.0)
MCHC: 33.8 g/dL (ref 30.0–36.0)
MCV: 93.5 fL (ref 80.0–100.0)
Monocytes Absolute: 2.2 10*3/uL — ABNORMAL HIGH (ref 0.1–1.0)
Monocytes Relative: 15 %
Neutro Abs: 10.9 10*3/uL — ABNORMAL HIGH (ref 1.7–7.7)
Neutrophils Relative %: 75 %
Platelets: 225 10*3/uL (ref 150–400)
RBC: 4.46 MIL/uL (ref 3.87–5.11)
RDW: 14 % (ref 11.5–15.5)
WBC: 14.5 10*3/uL — ABNORMAL HIGH (ref 4.0–10.5)
nRBC: 0 % (ref 0.0–0.2)

## 2021-04-17 LAB — URINALYSIS, ROUTINE W REFLEX MICROSCOPIC
Bacteria, UA: NONE SEEN
Bilirubin Urine: NEGATIVE
Glucose, UA: NEGATIVE mg/dL
Ketones, ur: NEGATIVE mg/dL
Nitrite: POSITIVE — AB
Protein, ur: NEGATIVE mg/dL
Specific Gravity, Urine: 1.004 — ABNORMAL LOW (ref 1.005–1.030)
pH: 7 (ref 5.0–8.0)

## 2021-04-17 LAB — COMPREHENSIVE METABOLIC PANEL
ALT: 51 U/L — ABNORMAL HIGH (ref 0–44)
AST: 62 U/L — ABNORMAL HIGH (ref 15–41)
Albumin: 3.2 g/dL — ABNORMAL LOW (ref 3.5–5.0)
Alkaline Phosphatase: 96 U/L (ref 38–126)
Anion gap: 9 (ref 5–15)
BUN: 14 mg/dL (ref 8–23)
CO2: 23 mmol/L (ref 22–32)
Calcium: 8.5 mg/dL — ABNORMAL LOW (ref 8.9–10.3)
Chloride: 102 mmol/L (ref 98–111)
Creatinine, Ser: 1.12 mg/dL — ABNORMAL HIGH (ref 0.44–1.00)
GFR, Estimated: 50 mL/min — ABNORMAL LOW (ref >60)
Glucose, Bld: 121 mg/dL — ABNORMAL HIGH (ref 70–99)
Potassium: 3.7 mmol/L (ref 3.5–5.1)
Sodium: 134 mmol/L — ABNORMAL LOW (ref 135–145)
Total Bilirubin: 1.7 mg/dL — ABNORMAL HIGH (ref 0.3–1.2)
Total Protein: 6.5 g/dL (ref 6.5–8.1)

## 2021-04-17 LAB — APTT: aPTT: 29 seconds (ref 24–36)

## 2021-04-17 LAB — LACTIC ACID, PLASMA: Lactic Acid, Venous: 0.9 mmol/L (ref 0.5–1.9)

## 2021-04-17 LAB — PROTIME-INR
INR: 1 (ref 0.8–1.2)
Prothrombin Time: 13.3 seconds (ref 11.4–15.2)

## 2021-04-17 LAB — RESP PANEL BY RT-PCR (FLU A&B, COVID) ARPGX2
Influenza A by PCR: NEGATIVE
Influenza B by PCR: NEGATIVE
SARS Coronavirus 2 by RT PCR: NEGATIVE

## 2021-04-17 MED ORDER — ACETAMINOPHEN 650 MG RE SUPP: 650.0000 mg | Freq: Four times a day (QID) | RECTAL | Status: DC | PRN

## 2021-04-17 MED ORDER — SODIUM CHLORIDE 0.9 % IV SOLN
1.0000 g | INTRAVENOUS | Status: DC
  Administered 2021-04-17: 1 g via INTRAVENOUS
  Filled 2021-04-17: qty 10

## 2021-04-17 MED ORDER — SODIUM CHLORIDE 0.9 % IV SOLN
2.0000 g | Freq: Once | INTRAVENOUS | Status: AC
  Administered 2021-04-17: 2 g via INTRAVENOUS
  Filled 2021-04-17: qty 2

## 2021-04-17 MED ORDER — ACETAMINOPHEN 325 MG PO TABS: 650.0000 mg | ORAL_TABLET | Freq: Four times a day (QID) | ORAL | Status: DC | PRN

## 2021-04-17 MED ORDER — CALCIUM CARBONATE 1250 (500 CA) MG PO TABS
1.0000 | ORAL_TABLET | Freq: Every day | ORAL | Status: DC
  Administered 2021-04-17 – 2021-04-18 (×2): 500 mg via ORAL
  Filled 2021-04-17 (×2): qty 1

## 2021-04-17 MED ORDER — VANCOMYCIN HCL IN DEXTROSE 1-5 GM/200ML-% IV SOLN: 1000.0000 mg | Freq: Once | INTRAVENOUS | Status: DC

## 2021-04-17 MED ORDER — VITAMIN B-12 1000 MCG PO TABS
2000.0000 ug | ORAL_TABLET | Freq: Every day | ORAL | Status: DC
  Administered 2021-04-17 – 2021-04-18 (×2): 2000 ug via ORAL
  Filled 2021-04-17 (×2): qty 2

## 2021-04-17 MED ORDER — LACTATED RINGERS IV BOLUS (SEPSIS)
1000.0000 mL | Freq: Once | INTRAVENOUS | Status: AC
  Administered 2021-04-17: 1000 mL via INTRAVENOUS

## 2021-04-17 MED ORDER — ENOXAPARIN SODIUM 40 MG/0.4ML IJ SOSY
40.0000 mg | PREFILLED_SYRINGE | INTRAMUSCULAR | Status: DC
  Administered 2021-04-17: 40 mg via SUBCUTANEOUS
  Filled 2021-04-17: qty 0.4

## 2021-04-17 MED ORDER — LEVOTHYROXINE SODIUM 75 MCG PO TABS
75.0000 ug | ORAL_TABLET | Freq: Every day | ORAL | Status: DC
  Administered 2021-04-17 – 2021-04-18 (×2): 75 ug via ORAL
  Filled 2021-04-17 (×2): qty 1

## 2021-04-17 MED ORDER — ESCITALOPRAM OXALATE 20 MG PO TABS
20.0000 mg | ORAL_TABLET | Freq: Every day | ORAL | Status: DC
  Administered 2021-04-17 – 2021-04-18 (×2): 20 mg via ORAL
  Filled 2021-04-17: qty 1
  Filled 2021-04-17: qty 2

## 2021-04-17 MED ORDER — VANCOMYCIN HCL 1500 MG/300ML IV SOLN
1500.0000 mg | Freq: Once | INTRAVENOUS | Status: DC
  Administered 2021-04-17: 1500 mg via INTRAVENOUS
  Filled 2021-04-17: qty 300

## 2021-04-17 MED ORDER — FLUTICASONE PROPIONATE 50 MCG/ACT NA SUSP: 1.0000 | Freq: Every day | NASAL | Status: DC

## 2021-04-17 MED ORDER — MEMANTINE HCL 10 MG PO TABS
10.0000 mg | ORAL_TABLET | Freq: Two times a day (BID) | ORAL | Status: DC
  Administered 2021-04-17 – 2021-04-18 (×3): 10 mg via ORAL
  Filled 2021-04-17 (×4): qty 1

## 2021-04-17 MED ORDER — LACTATED RINGERS IV BOLUS (SEPSIS)
500.0000 mL | Freq: Once | INTRAVENOUS | Status: AC
  Administered 2021-04-17: 500 mL via INTRAVENOUS

## 2021-04-17 MED ORDER — DONEPEZIL HCL 10 MG PO TABS
10.0000 mg | ORAL_TABLET | Freq: Every day | ORAL | Status: DC
  Administered 2021-04-17: 10 mg via ORAL
  Filled 2021-04-17 (×2): qty 1

## 2021-04-17 MED ORDER — LACTINEX PO CHEW
2.0000 | CHEWABLE_TABLET | Freq: Three times a day (TID) | ORAL | Status: DC
  Administered 2021-04-17 – 2021-04-18 (×2): 2 via ORAL
  Filled 2021-04-17 (×4): qty 2

## 2021-04-17 MED ORDER — VANCOMYCIN HCL 1250 MG/250ML IV SOLN: 1250.0000 mg | INTRAVENOUS | Status: DC

## 2021-04-17 MED ORDER — ACETAMINOPHEN 325 MG PO TABS
650.0000 mg | ORAL_TABLET | Freq: Four times a day (QID) | ORAL | Status: DC | PRN
  Administered 2021-04-17 – 2021-04-18 (×3): 650 mg via ORAL
  Filled 2021-04-17 (×3): qty 2

## 2021-04-17 MED ORDER — CRANBERRY 125 MG PO TABS: 2.0000 | ORAL_TABLET | Freq: Every day | ORAL | Status: DC

## 2021-04-17 MED ORDER — CARBOXYMETHYLCELLULOSE SODIUM 0.5 % OP SOLN: 1.0000 [drp] | Freq: Three times a day (TID) | OPHTHALMIC | Status: DC | PRN

## 2021-04-17 MED ORDER — BACID PO TABS
2.0000 | ORAL_TABLET | Freq: Three times a day (TID) | ORAL | Status: DC
  Filled 2021-04-17 (×2): qty 2

## 2021-04-17 MED ORDER — LACTATED RINGERS IV SOLN: INTRAVENOUS | Status: AC

## 2021-04-17 MED ORDER — ATORVASTATIN CALCIUM 40 MG PO TABS: 40.0000 mg | ORAL_TABLET | Freq: Every day | ORAL | Status: DC

## 2021-04-17 MED ORDER — SODIUM CHLORIDE 0.9 % IV SOLN: 2.0000 g | Freq: Two times a day (BID) | INTRAVENOUS | Status: DC

## 2021-04-17 MED ORDER — PANTOPRAZOLE SODIUM 40 MG PO TBEC
40.0000 mg | DELAYED_RELEASE_TABLET | Freq: Every day | ORAL | Status: DC
  Administered 2021-04-17 – 2021-04-18 (×2): 40 mg via ORAL
  Filled 2021-04-17 (×2): qty 1

## 2021-04-17 NOTE — ED Provider Notes (Addendum)
[facility] EMERGENCY DEPARTMENT Provider Note   CSN: [id number] Arrival date & time: 04/17/21  1021     History  Chief Complaint  Patient presents with   Weakness    Frances Hoffman is a 81 y.o. female.  According to patient's husband patient was feeling fine until last evening.  Then patient started not to feel well.  Patient just states she is got generalized weakness not able to get out of bed.  Felt hot to touch.  But they checked her temperature at home it was normal.  Patient recently treated for urinary tract infection.  But no longer on antibiotics.  Patient denies any cough or congestion denies any nausea vomiting or diarrhea.  Patient febrile here temp 100.5.  Respiratory rate up at 23 heart rate right around 100.  Oxygen saturation 92%.  Blood pressure good though it is 111/76.  Will initiate sepsis protocol based on vital signs.  Past medical history is significant for depression hyperlipidemia recurrent urinary tract infections breast cancer hypothyroidism memory difficulties past surgical significant for gallbladder removal in 2016 breast lumpectomy in 2000.      Home Medications Prior to Admission medications   Medication Sig Start Date End Date Taking? Authorizing Provider  atorvastatin (LIPITOR) 40 MG tablet Take 1 tablet (40 mg total) by mouth daily. For cholesterol. 02/21/21   Pleas Koch, NP  b complex vitamins tablet Take 1 tablet by mouth daily.    [provider]  calcium carbonate (OS-CAL - DOSED IN MG OF ELEMENTAL CALCIUM) 1250 (500 CA) MG tablet Take 1 tablet by mouth daily.    [provider]  Calcium Citrate (CITRACAL PO) Take by mouth.    [provider]  carboxymethylcellulose (REFRESH PLUS) 0.5 % SOLN Place 1 drop into both eyes 3 (three) times daily as needed (for dryness).    [provider]  Cholecalciferol (VITAMIN D3) 2000 UNITS capsule Take 2,000 Units by mouth daily.    [provider]  Cranberry 125 MG TABS Take 2 tablets by mouth.    [provider]  cyanocobalamin ([employer] VITAMIN B12) 2000 MCG tablet Take 1 tablet (2,000 mcg total) by mouth daily. 02/04/18   Opalski, Deborah, DO  donepezil (ARICEPT) 10 MG tablet Take 1 tablet (10 mg total) by mouth at bedtime. Patient not taking: Reported on 02/16/2021 09/20/20   [name], MD  escitalopram Loma Sousa) 20 MG tablet Take 1 tablet by mouth once daily 10/06/20   [name], MD  EUTHYROX 75 MCG tablet Take 1 tablet by mouth once daily 11/14/20   [name], MD  fluticasone [facility]) 50 MCG/ACT nasal spray 1 spray 2 times daily after sinus rinses- Milta Deiters med or [facility] 12/20/17   Mellody Dance, DO  ibuprofen (ADVIL,MOTRIN) 600 MG tablet Take 1 tablet (600 mg total) by mouth every 8 (eight) hours as needed (severer pain/ headache). 02/24/18   Mellody Dance, DO  loperamide (IMODIUM) 1 MG/5ML solution Take by mouth as needed for diarrhea or loose stools. Teaspoons amounts    [provider]  memantine (NAMENDA) 10 MG tablet Take 1 tablet (10 mg total) by mouth 2 (two) times daily. 11/24/20   Marcial Pacas, MD  Multiple Vitamin (MULTIVITAMIN) tablet Take 1 tablet by mouth daily.    [provider]  Omega-3 Fatty Acids (FISH OIL PO) Take 2 capsules by mouth 2 (two) times daily.    [provider]  omeprazole (PRILOSEC) 20 MG capsule Take by  mouth daily. 09/14/19   [provider]  vitamin C (ASCORBIC ACID) 500 MG tablet Take 500 mg by mouth daily.    [provider]      Allergies    Ciprofloxacin, Citalopram hydrobromide, Flagyl [metronidazole], and Oxycodone    Review of Systems   Review of Systems  Constitutional:  Positive for fatigue and fever. Negative for chills.  HENT:  Negative for ear pain and sore throat.   Eyes:  Negative for pain and visual disturbance.  Respiratory:  Negative for cough and shortness of breath.   Cardiovascular:  Negative for  chest pain and palpitations.  Gastrointestinal:  Negative for abdominal pain and vomiting.  Genitourinary:  Negative for dysuria and hematuria.  Musculoskeletal:  Positive for myalgias. Negative for arthralgias and back pain.  Skin:  Negative for color change and rash.  Neurological:  Negative for seizures and syncope.  All other systems reviewed and are negative.  Physical Exam Updated Vital Signs BP 126/79    Pulse 96    Temp 98.2 F (36.8 C) (Oral)    Resp (!) 29    SpO2 97%  Physical Exam Vitals and nursing note reviewed.  Constitutional:      General: She is not in acute distress.    Appearance: Normal appearance. She is well-developed.  HENT:     Head: Normocephalic and atraumatic.  Eyes:     Extraocular Movements: Extraocular movements intact.     Conjunctiva/sclera: Conjunctivae normal.     Pupils: Pupils are equal, round, and reactive to light.  Cardiovascular:     Rate and Rhythm: Normal rate and regular rhythm.     Heart sounds: No murmur heard. Pulmonary:     Effort: Pulmonary effort is normal. No respiratory distress.     Breath sounds: Normal breath sounds.  Abdominal:     Palpations: Abdomen is soft.     Tenderness: There is no abdominal tenderness.  Musculoskeletal:        General: No swelling.     Cervical back: Normal range of motion and neck supple.  Skin:    General: Skin is warm and dry.     Capillary Refill: Capillary refill takes less than 2 seconds.  Neurological:     General: No focal deficit present.     Mental Status: She is alert. Mental status is at baseline.     Cranial Nerves: No cranial nerve deficit.     Sensory: No sensory deficit.     Motor: No weakness.  Psychiatric:        Mood and Affect: Mood normal.    ED Results / Procedures / Treatments   Labs (all labs ordered are listed, but only abnormal results are displayed) Labs Reviewed  COMPREHENSIVE METABOLIC PANEL - Abnormal; Notable for the following components:      Result  Value   Sodium 134 (*)    Glucose, Bld 121 (*)    Creatinine, Ser 1.12 (*)    Calcium 8.5 (*)    Albumin 3.2 (*)    AST 62 (*)    ALT 51 (*)    Total Bilirubin 1.7 (*)    GFR, Estimated 50 (*)    All other components within normal limits  CBC WITH DIFFERENTIAL/PLATELET - Abnormal; Notable for the following components:   WBC 14.5 (*)    Neutro Abs 10.9 (*)    Monocytes Absolute 2.2 (*)    Abs Immature Granulocytes 0.08 (*)    All other components within normal  limits  URINALYSIS, ROUTINE W REFLEX MICROSCOPIC - Abnormal; Notable for the following components:   Specific Gravity, Urine 1.004 (*)    Hgb urine dipstick SMALL (*)    Nitrite POSITIVE (*)    Leukocytes,Ua LARGE (*)    All other components within normal limits  RESP PANEL BY RT-PCR (FLU A&B, COVID) ARPGX2  CULTURE, BLOOD (ROUTINE X 2)  CULTURE, BLOOD (ROUTINE X 2)  URINE CULTURE  LACTIC ACID, PLASMA  PROTIME-INR  APTT  LACTIC ACID, PLASMA    EKG EKG Interpretation  Date/Time:  Monday April 17 2021 11:32:39 EST Ventricular Rate:  88 PR Interval:  198 QRS Duration: 90 QT Interval:  379 QTC Calculation: 459 R Axis:   21 Text Interpretation: Sinus rhythm Low voltage, precordial leads Borderline T abnormalities, anterior leads Confirmed by Fredia Sorrow [phone]) on 04/17/2021 11:35:47 AM  Radiology DG Chest Port 1 View  Result Date: 04/17/2021 CLINICAL DATA:  Generalized weakness, fever, chills, possible sepsis. EXAM: PORTABLE CHEST 1 VIEW COMPARISON:  11/20/2017.  CT chest 01/06/2018. FINDINGS: Trachea is midline. Heart size is accentuated by AP technique. There may be minimal bibasilar subsegmental atelectasis. No airspace consolidation or pleural fluid. IMPRESSION: No acute findings. Electronically Signed   By: Lorin Picket M.D.   On: 04/17/2021 11:00    Procedures Procedures    Medications Ordered in ED Medications  lactated ringers infusion ( Intravenous New Bag/Given 04/17/21 1229)  vancomycin  (VANCOREADY) IVPB 1500 mg/300 mL (1,500 mg Intravenous New Bag/Given 04/17/21 1303)  ceFEPIme (MAXIPIME) 2 g in sodium chloride 0.9 % 100 mL IVPB (has no administration in time range)  vancomycin (VANCOREADY) IVPB 1250 mg/250 mL (has no administration in time range)  acetaminophen (TYLENOL) tablet 650 mg (650 mg Oral Given 04/17/21 1300)  lactated ringers bolus 1,000 mL (0 mLs Intravenous Stopped 04/17/21 1228)    And  lactated ringers bolus 1,000 mL (0 mLs Intravenous Stopped 04/17/21 1228)    And  lactated ringers bolus 500 mL (0 mLs Intravenous Stopped 04/17/21 1255)  ceFEPIme (MAXIPIME) 2 g in sodium chloride 0.9 % 100 mL IVPB (0 g Intravenous Stopped 04/17/21 1228)    ED Course/ Medical Decision Making/ A&P                           Medical Decision Making Amount and/or Complexity of Data Reviewed Labs: ordered. Radiology: ordered. ECG/medicine tests: ordered.  Risk OTC drugs. Prescription drug management. Decision regarding hospitalization.   CRITICAL CARE Performed by: Fredia Sorrow Total critical care time: 40 minutes Critical care time was exclusive of separately billable procedures and treating other patients. Critical care was necessary to treat or prevent imminent or life-threatening deterioration. Critical care was time spent personally by me on the following activities: development of treatment plan with patient and/or surrogate as well as nursing, discussions with consultants, evaluation of patient's response to treatment, examination of patient, obtaining history from patient or surrogate, ordering and performing treatments and interventions, ordering and review of laboratory studies, ordering and review of radiographic studies, pulse oximetry and re-evaluation of patient's condition.  Patient with concerns for sepsis on presentation due to the tachypnea borderline tachycardia and fever.  Based on this patient received broad-spectrum antibiotics.  And received 30 cc/kg  fluid bolus.  Patient's urinalysis concerning for urinary tract infection with positive nitrite.  There was not a lot of bacteria.  Urine culture sent.  Blood culture sent.  COVID influenza testing negative.  Reassuring lactic acid  on the first 1 was 0.9.  Patient with some mild hyponatremia with a sodium of 134 creatinine up some at 1.12 for a GFR of 50.  Does have a significant leukocytosis with a white blood cell count of 14.5.  Hemoglobin 6 normal at 14.1.  Chest x-ray without any acute findings.  Patient has remained hemodynamically stable.  We had 1 blood pressure that was low.  But overall feel patient needs admission per tickly with the leukocytosis source may be the urine.  Will contact hospitalist for admission.  Final Clinical Impression(s) / ED Diagnoses Final diagnoses:  Sepsis, due to unspecified organism, unspecified whether acute organ dysfunction present [facility])  Acute cystitis without hematuria    Rx / DC Orders ED Discharge Orders     None         Fredia Sorrow, MD 04/17/21 1135    Fredia Sorrow, MD 04/17/21 1404

## 2021-04-17 NOTE — Progress Notes (Signed)
Blood cultures drawn after antibiotics hung per bedside RN

## 2021-04-17 NOTE — Telephone Encounter (Signed)
[facility] - Client TELEPHONE ADVICE RECORD [facility] Patient Name: Frances Hoffman Gender: Female DOB: 02/05/1941 Age: 81 Y 5 M 23 D Return Phone Number: [phone] (Primary), [phone] (Secondary) Address: [address] City/ State/ Zip: [place]  [address] Client [facility] - Client Client Site [place] Provider Waunita Schooner- MD Contact Type Call Who Is Calling Patient / Member / Family / Caregiver Call Type Triage / Clinical [name] Name Rieley Khalsa Relationship To Patient Spouse Return Phone Number [phone] (Primary) Chief Complaint Walking difficulty Reason for Call Symptomatic / Request for [place] states that his wife has a fever and fatigue. Symptoms began yesterday and she was also having difficulty walking and today she is having difficulty as well. Current temp is 98.4 her normal runs around 97.2 Translation No Nurse Assessment Nurse: Rolena Infante, RN, Patrice Date/Time (Eastern Time): 04/17/2021 9:02:24 AM Confirm and document reason for call. If symptomatic, describe symptoms. ---[name] states his wife has a fever and fatigue since yesterday. Now having difficulty walking this morning. Some dizziness. Denies any chest pain. Fever 99 Forehead. Does the patient have any new or worsening symptoms? ---Yes Will a triage be completed? ---Yes Related visit to physician within the last 2 weeks? ---No Does the PT have any chronic conditions? (i.e. diabetes, asthma, this includes High risk factors for pregnancy, etc.) ---No Is this a behavioral health or substance abuse call? ---No Guidelines Guideline Title Affirmed Question Affirmed Notes Nurse Date/Time (Eastern Time) Weakness (Generalized) and Fatigue [1] SEVERE weakness (i.e., unable to walk or barely able to walk, requires support) AND [2] new-onset or  worsening Rolena Infante, RN, Sharl Ma 04/17/2021 9:06:46 AM PLEASE NOTE: All timestamps contained within this report are represented as [place] Standard Time. CONFIDENTIALTY NOTICE: This fax transmission is intended only for the addressee. It contains information that is legally privileged, confidential or otherwise protected from use or disclosure. If you are not the intended recipient, you are strictly prohibited from reviewing, disclosing, copying using or disseminating any of this information or taking any action in reliance on or regarding this information. If you have received this fax in error, please notify us immediately by telephone so that we can arrange for its return to [place]. Phone: [phone], Toll-Free: [phone], Fax: [phone] Page: 2 of 2 Call Id: [id number] [place]. Time Eilene Ghazi Time) Disposition Final User 04/17/2021 9:10:16 AM Send To RN Personal Rolena Infante, RN, Patrice 04/17/2021 9:23:19 AM 911 Outcome Documentation Rolena Infante, RN, Patrice Reason: 911 with patient at present evaluating her. 04/17/2021 9:09:09 AM Call EMS 911 Now Yes Rolena Infante, RN, Patrice [name] Disagree/Comply Comply [name] Understands Yes PreDisposition InappropriateToAsk Care Advice Given Per Guideline CALL EMS 911 NOW: * Immediate medical attention is needed. You need to hang up and call 911 (or an ambulance

## 2021-04-17 NOTE — ED Triage Notes (Signed)
Here for generalized weakness, hot to touch, chills. Hx of UTIs. Alert and oriented x 4.

## 2021-04-17 NOTE — Telephone Encounter (Signed)
Per chart review tab pt is at [facility] ED. Sending note to Dr Einar Pheasant and [facility] CMA.

## 2021-04-17 NOTE — Progress Notes (Signed)
Elink following code sepsis °

## 2021-04-17 NOTE — ED Notes (Signed)
Pt is requesting medication for headache. MD notified. Waiting for orders.

## 2021-04-17 NOTE — Progress Notes (Deleted)
Blood cultures drawn before antibiotics hung 

## 2021-04-17 NOTE — H&P (Signed)
History and Physical    Frances Hoffman WUJ:[id number] DOB: 01-16-1941 DOA: 04/17/2021  PCP: Lynnda Child, MD (Confirm with patient/family/NH records and if not entered, this has to be entered at [facility] point of entry) Patient coming from: Home  I have personally briefly reviewed patient's old medical records in [facility]  Chief Complaint: belly hurts, feeling chills  HPI: Frances Hoffman is a 81 y.o. female with medical history significant of recurrent UTIs, dementia, anxiety/depression, HLD, hypothyroidism came with new onset of fever/chills, lower abdominal pain.  Yesterday evening, patient started to feel episode of subjective fever and chills, but there was no dysuria, cough, no diarrhea.  This morning, patient woke up with new onset of suprapubic lower abdominal pain, cramping-like, localized.  Associated with chills and generalized weakness.  Denies any nauseous vomiting, cough, no chest pains, no diarrhea.  Husband found the patient "looks shaky"  ED Course: Fever of 100.5, baseline tachycardia.  WBC 14.5, creatinine 1.1, K3.7.  UA showed pyuria and WBC 21-50.  AST 62, ALT 51, bilirubin 1.7.  Chest x-ray negative for acute infiltrates.  Patient was given vancomycin and cefepime and 2.5 L of IV fluid, blood pressure improved.  Review of Systems: As per HPI otherwise 14 point review of systems negative.    Past Medical History:  Diagnosis Date   Adjustment disorder with mixed anxiety and depressed mood 12/12/2007   Qualifier: Diagnosis of  By: Copland MD, Spencer     Breast cancer (HCC)    Cancer (HCC) 2000   breast had lumpectomy/radiation x36,mammo ,no chemo   Cataract of both eyes    Depression    Dysuria-frequency syndrome    takes AZO   Frozen shoulder    Gall stones    GERD 12/12/2007   Qualifier: Diagnosis of  By: Copland MD, Spencer     Hyperlipidemia    Hypothyroidism 12/12/2007   Qualifier: Diagnosis of  By: Patsy Lager MD, Spencer     Memory difficulties  09/27/2017   Memory loss    Memory loss 09/22/2014   Osteopenia    BMD 2004, WNL 2008   Personal history of radiation therapy    Pneumonia    Recurrent UTI    Shingles    chronic body pain, left side of body   Shortness of breath dyspnea    when climbing stairs only   Wears glasses     Past Surgical History:  Procedure Laterality Date   BREAST LUMPECTOMY Left 2000   CHOLECYSTECTOMY N/A 02/21/2015   Procedure: LAPAROSCOPIC CHOLECYSTECTOMY WITH INTRAOPERATIVE CHOLANGIOGRAM;  Surgeon: Gaynelle Adu, MD;  Location: [facility] OR;  Service: General;  Laterality: N/A;   COLONOSCOPY     MULTIPLE TOOTH EXTRACTIONS       reports that she has never smoked. She has never used smokeless tobacco. She reports that she does not drink alcohol and does not use drugs.  Allergies  Allergen Reactions   Ciprofloxacin Other (See Comments)    Unknown (vertigo??)   Citalopram Hydrobromide Other (See Comments)    Intrusive/ odd thoughts    Flagyl [Metronidazole] Other (See Comments)    Unknown??   Oxycodone Other (See Comments)    Headaches    Family History  Problem Relation Age of Onset   Osteoporosis Mother    Parkinsonism Father    Breast cancer Sister 61   Breast cancer Other    Coronary artery disease Sister    Ovarian cancer Sister    Heart disease Maternal Grandmother  Heart disease Maternal Grandfather    Heart attack Maternal Grandfather 50   Heart disease Son    Heart attack Son 29     Prior to Admission medications   Medication Sig Start Date End Date Taking? Authorizing Provider  atorvastatin (LIPITOR) 40 MG tablet Take 1 tablet (40 mg total) by mouth daily. For cholesterol. 02/21/21  Yes Doreene Nest, NP  b complex vitamins tablet Take 1 tablet by mouth daily.   Yes [provider]  calcium carbonate (OS-CAL - DOSED IN MG OF ELEMENTAL CALCIUM) 1250 (500 CA) MG tablet Take 1 tablet by mouth daily.   Yes [provider]  Cholecalciferol (VITAMIN D3) 2000  UNITS capsule Take 2,000 Units by mouth daily.   Yes [provider]  Cranberry 125 MG TABS Take 2 tablets by mouth.   Yes [provider]  cyanocobalamin ([employer] VITAMIN B12) 2000 MCG tablet Take 1 tablet (2,000 mcg total) by mouth daily. 02/04/18  Yes Opalski, Deborah, DO  donepezil (ARICEPT) 10 MG tablet Take 1 tablet (10 mg total) by mouth at bedtime. 09/20/20  Yes Lynnda Child, MD  escitalopram (LEXAPRO) 20 MG tablet Take 1 tablet by mouth once daily 10/06/20  Yes Lynnda Child, MD  EUTHYROX 75 MCG tablet Take 1 tablet by mouth once daily 11/14/20  Yes Lynnda Child, MD  fluticasone [facility]) 50 MCG/ACT nasal spray 1 spray 2 times daily after sinus rinses- Lloyd Huger med or AYR 12/20/17  Yes Opalski, Gavin Pound, DO  loperamide (IMODIUM) 1 MG/5ML solution Take by mouth as needed for diarrhea or loose stools. Teaspoons amounts   Yes [provider]  memantine (NAMENDA) 10 MG tablet Take 1 tablet (10 mg total) by mouth 2 (two) times daily. 11/24/20  Yes Levert Feinstein, MD  Calcium Citrate (CITRACAL PO) Take by mouth.    [provider]  carboxymethylcellulose (REFRESH PLUS) 0.5 % SOLN Place 1 drop into both eyes 3 (three) times daily as needed (for dryness).    [provider]  ibuprofen (ADVIL,MOTRIN) 600 MG tablet Take 1 tablet (600 mg total) by mouth every 8 (eight) hours as needed (severer pain/ headache). 02/24/18   [name], DO  Multiple Vitamin (MULTIVITAMIN) tablet Take 1 tablet by mouth daily.    [provider]  Omega-3 Fatty Acids (FISH OIL PO) Take 2 capsules by mouth 2 (two) times daily.    [provider]  omeprazole (PRILOSEC) 20 MG capsule Take by mouth daily. 09/14/19   [provider]  vitamin C (ASCORBIC ACID) 500 MG tablet Take 500 mg by mouth daily.    [provider]    Physical Exam: Vitals:   04/17/21 1245 04/17/21 1330 04/17/21 1400 04/17/21 1430  BP: 120/63 126/79    Pulse: 96 96  (!) 102   Resp: (!) 23 (!) 29  (!) 25  Temp:   98.2 F (36.8 C)   TempSrc:   Oral   SpO2: 96% 97%  94%    Constitutional: NAD, calm, comfortable Vitals:   04/17/21 1245 04/17/21 1330 04/17/21 1400 04/17/21 1430  BP: 120/63 126/79    Pulse: 96 96  (!) 102  Resp: (!) 23 (!) 29  (!) 25  Temp:   98.2 F (36.8 C)   TempSrc:   Oral   SpO2: 96% 97%  94%   Eyes: PERRL, lids and conjunctivae normal ENMT: Mucous membranes are moist. Posterior pharynx clear of any exudate or lesions.Normal dentition.  Neck: normal, supple, no masses,  no thyromegaly Respiratory: clear to auscultation bilaterally, no wheezing, no crackles. Normal respiratory effort. No accessory muscle use.  Cardiovascular: Regular rate and rhythm, no murmurs / rubs / gallops. No extremity edema. 2+ pedal pulses. No carotid bruits.  Abdomen: mild tenderness on supra-pubic area, no rebound, no guarding, no masses palpated. No hepatosplenomegaly. Bowel sounds positive.  Musculoskeletal: no clubbing / cyanosis. No joint deformity upper and lower extremities. Good ROM, no contractures. Normal muscle tone.  Skin: no rashes, lesions, ulcers. No induration Neurologic: CN 2-12 grossly intact. Sensation intact, DTR normal. Strength 5/5 in all 4.  Psychiatric: Normal judgment and insight. Alert and oriented x 3. Normal mood.    Labs on Admission: I have personally reviewed following labs and imaging studies  CBC: Recent Labs  Lab 04/17/21 1109  WBC 14.5*  NEUTROABS 10.9*  HGB 14.1  HCT 41.7  MCV 93.5  PLT 225   Basic Metabolic Panel: Recent Labs  Lab 04/17/21 1109  NA 134*  K 3.7  CL 102  CO2 23  GLUCOSE 121*  BUN 14  CREATININE 1.12*  CALCIUM 8.5*   GFR: CrCl cannot be calculated (Unknown ideal weight.). Liver Function Tests: Recent Labs  Lab 04/17/21 1109  AST 62*  ALT 51*  ALKPHOS 96  BILITOT 1.7*  PROT 6.5  ALBUMIN 3.2*   No results for input(s): LIPASE, AMYLASE in the last 168 hours. No results for  input(s): AMMONIA in the last 168 hours. Coagulation Profile: Recent Labs  Lab 04/17/21 1109  INR 1.0   Cardiac Enzymes: No results for input(s): CKTOTAL, CKMB, CKMBINDEX, TROPONINI in the last 168 hours. BNP (last 3 results) No results for input(s): PROBNP in the last 8760 hours. HbA1C: No results for input(s): HGBA1C in the last 72 hours. CBG: No results for input(s): GLUCAP in the last 168 hours. Lipid Profile: No results for input(s): CHOL, HDL, LDLCALC, TRIG, CHOLHDL, LDLDIRECT in the last 72 hours. Thyroid Function Tests: No results for input(s): TSH, T4TOTAL, FREET4, T3FREE, THYROIDAB in the last 72 hours. Anemia Panel: No results for input(s): VITAMINB12, FOLATE, FERRITIN, TIBC, IRON, RETICCTPCT in the last 72 hours. Urine analysis:    Component Value Date/Time   COLORURINE YELLOW 04/17/2021 1253   APPEARANCEUR CLEAR 04/17/2021 1253   LABSPEC 1.004 (L) 04/17/2021 1253   PHURINE 7.0 04/17/2021 1253   GLUCOSEU NEGATIVE 04/17/2021 1253   HGBUR SMALL (A) 04/17/2021 1253   HGBUR negative 10/18/2008 1223   BILIRUBINUR NEGATIVE 04/17/2021 1253   BILIRUBINUR negative 08/08/2020 1518   BILIRUBINUR negative 08/03/2020 1051   KETONESUR NEGATIVE 04/17/2021 1253   PROTEINUR NEGATIVE 04/17/2021 1253   UROBILINOGEN 0.2 08/08/2020 1518   UROBILINOGEN 0.2 04/03/2014 1440   NITRITE POSITIVE (A) 04/17/2021 1253   LEUKOCYTESUR LARGE (A) 04/17/2021 1253    Radiological Exams on Admission: DG Chest Port 1 View  Result Date: 04/17/2021 CLINICAL DATA:  Generalized weakness, fever, chills, possible sepsis. EXAM: PORTABLE CHEST 1 VIEW COMPARISON:  11/20/2017.  CT chest 01/06/2018. FINDINGS: Trachea is midline. Heart size is accentuated by AP technique. There may be minimal bibasilar subsegmental atelectasis. No airspace consolidation or pleural fluid. IMPRESSION: No acute findings. Electronically Signed   By: Leanna Battles M.D.   On: 04/17/2021 11:00    EKG: Independently reviewed.  Sinus, no acute ST-T changes.  Assessment/Plan Principal Problem:   UTI (urinary tract infection)  (please populate well all problems here in Problem List. (For example, if patient is on BP meds at home and you resume or decide to  hold them, it is a problem that needs to be her. Same for CAD, COPD, HLD and so on)   SIRS -Secondary to UTI -Reviewed patient's urine culture in the last year, also pansensitive E. coli.  Switch antibiotics to ceftriaxone, if no no fevers, consider switch to p.o. antibiotics tomorrow.  Generalized weakness -Secondary to UTI, treat UTI, PT evaluation tomorrow.  Acute transaminitis -No RUQ symptoms, no RUQ tenderness.  Check artery ultrasound, repeat LFT tomorrow.  Hold statin.  Dementia -Mentation at baseline, continue memantine and Aricept.  Hypothyroidism -Continue with Synthroid  Anxiety/depression -Continue Lexapro  DVT prophylaxis: Lovenox Code Status: Full code Family Communication: Husband at bedside Disposition Plan: Expect less than 2 midnight hospital stay Consults called: None Admission status: MedSurg observation   Emeline General MD [facility] Pager [phone]  04/17/2021, 2:46 PM

## 2021-04-17 NOTE — Progress Notes (Signed)
Pharmacy Antibiotic Note  Frances Hoffman is a 81 y.o. female admitted on 04/17/2021 presenting with generalized weakness, febrile, concern for sepsis.  Pharmacy has been consulted for vancomycin and cefepime dosing.  Plan: Vancomycin 1500 mg IV x 1, then 1250 mg IV q 36h (eAUC 443, Goal AUC 400-550, SCr 1.12) Cefepime 2g IV q 12h Monitor renal function, Cx and clinical progression to narrow Vancomycin levels as indicated      Temp (24hrs), Avg:100.5 F (38.1 C), Min:100.5 F (38.1 C), Max:100.5 F (38.1 C)  Recent Labs  Lab 04/17/21 1109  WBC 14.5*  CREATININE 1.12*  LATICACIDVEN 0.9    CrCl cannot be calculated (Unknown ideal weight.).    Allergies  Allergen Reactions   Ciprofloxacin Other (See Comments)    Unknown (vertigo??)   Citalopram Hydrobromide Other (See Comments)    Intrusive/ odd thoughts    Flagyl [Metronidazole] Other (See Comments)    Unknown??   Oxycodone Other (See Comments)    Headaches    Bertis Ruddy, PharmD Clinical Pharmacist ED Pharmacist Phone # [phone] 04/17/2021 12:43 PM

## 2021-04-17 NOTE — Telephone Encounter (Signed)
Admitted for sepsis concern. Will plan to see after discharge

## 2021-04-18 ENCOUNTER — Encounter (HOSPITAL_COMMUNITY): Payer: Self-pay | Admitting: Internal Medicine

## 2021-04-18 DIAGNOSIS — N3000 Acute cystitis without hematuria: Secondary | ICD-10-CM | POA: Diagnosis not present

## 2021-04-18 LAB — HEPATIC FUNCTION PANEL
ALT: 39 U/L (ref 0–44)
AST: 36 U/L (ref 15–41)
Albumin: 2.6 g/dL — ABNORMAL LOW (ref 3.5–5.0)
Alkaline Phosphatase: 80 U/L (ref 38–126)
Bilirubin, Direct: 0.2 mg/dL (ref 0.0–0.2)
Indirect Bilirubin: 0.8 mg/dL (ref 0.3–0.9)
Total Bilirubin: 1 mg/dL (ref 0.3–1.2)
Total Protein: 5.6 g/dL — ABNORMAL LOW (ref 6.5–8.1)

## 2021-04-18 LAB — BASIC METABOLIC PANEL WITH GFR
Anion gap: 8 (ref 5–15)
BUN: 9 mg/dL (ref 8–23)
CO2: 25 mmol/L (ref 22–32)
Calcium: 8.9 mg/dL (ref 8.9–10.3)
Chloride: 106 mmol/L (ref 98–111)
Creatinine, Ser: 1.01 mg/dL — ABNORMAL HIGH (ref 0.44–1.00)
GFR, Estimated: 56 mL/min — ABNORMAL LOW
Glucose, Bld: 120 mg/dL — ABNORMAL HIGH (ref 70–99)
Potassium: 3.6 mmol/L (ref 3.5–5.1)
Sodium: 139 mmol/L (ref 135–145)

## 2021-04-18 LAB — CBC
HCT: 37.2 % (ref 36.0–46.0)
Hemoglobin: 12.4 g/dL (ref 12.0–15.0)
MCH: 31.2 pg (ref 26.0–34.0)
MCHC: 33.3 g/dL (ref 30.0–36.0)
MCV: 93.5 fL (ref 80.0–100.0)
Platelets: 181 K/uL (ref 150–400)
RBC: 3.98 MIL/uL (ref 3.87–5.11)
RDW: 14.1 % (ref 11.5–15.5)
WBC: 9.7 K/uL (ref 4.0–10.5)
nRBC: 0 % (ref 0.0–0.2)

## 2021-04-18 MED ORDER — CEPHALEXIN 250 MG PO CAPS: 250.0000 mg | ORAL_CAPSULE | Freq: Four times a day (QID) | ORAL | 0 refills | Status: AC

## 2021-04-18 NOTE — Discharge Summary (Signed)
Physician Discharge Summary  Frances Hoffman:[id number] DOB: Oct 09, 1940 DOA: 04/17/2021  PCP: Lynnda Child, MD  Admit date: 04/17/2021 Discharge date: 04/18/2021  Admitted From: Home Disposition: Home  Discharge Condition: Stable CODE STATUS: Full Diet recommendation: As tolerated  Brief/Interim Summary:  Frances Hoffman is a 81 y.o. female with medical history significant of recurrent UTIs, dementia, anxiety/depression, HLD, hypothyroidism came with new onset of fever/chills, lower abdominal pain.  Patient resolved quite quickly for UTI, no longer having any episodes of weakness ambulatory dysfunction fevers chills mental status is back to baseline per husband at bedside.  Otherwise stable and agreeable for discharge home, continue Keflex outpatient at discharge.  Repeat evaluation with PCP in the next week -she has an appointment in the morning on the 15th for labs, recommend keeping this appointment  Discharge Diagnoses:  Principal Problem:   UTI (urinary tract infection)  Discharge Instructions   Allergies as of 04/18/2021       Reactions   Ciprofloxacin Other (See Comments)   Unknown (vertigo??)   Citalopram Hydrobromide Other (See Comments)   Intrusive/ odd thoughts    Flagyl [metronidazole] Other (See Comments)   Unknown??   Oxycodone Other (See Comments)   Headaches        Medication List     TAKE these medications    atorvastatin 40 MG tablet Commonly known as: LIPITOR Take 1 tablet (40 mg total) by mouth daily. For cholesterol.   b complex vitamins tablet Take 1 tablet by mouth daily.   calcium carbonate 1250 (500 Ca) MG tablet Commonly known as: OS-CAL - dosed in mg of elemental calcium Take 1 tablet by mouth daily.   carboxymethylcellulose 0.5 % Soln Commonly known as: REFRESH PLUS Place 1 drop into both eyes 3 (three) times daily as needed (for dryness).   cephALEXin 250 MG capsule Commonly known as: KEFLEX Take 1 capsule (250 mg total)  by mouth 4 (four) times daily for 3 days.   CITRACAL PO Take by mouth.   Cranberry 125 MG Tabs Take 2 tablets by mouth.   cyanocobalamin 2000 MCG tablet Commonly known as: [employer]S VITAMIN B12 Take 1 tablet (2,000 mcg total) by mouth daily.   donepezil 10 MG tablet Commonly known as: ARICEPT Take 1 tablet (10 mg total) by mouth at bedtime.   escitalopram 20 MG tablet Commonly known as: LEXAPRO Take 1 tablet by mouth once daily   Euthyrox 75 MCG tablet Generic drug: levothyroxine Take 1 tablet by mouth once daily   FISH OIL PO Take 2 capsules by mouth 2 (two) times daily.   fluticasone 50 MCG/ACT nasal spray Commonly known as: FLONASE 1 spray 2 times daily after sinus rinses- Lloyd Huger med or AYR   ibuprofen 600 MG tablet Commonly known as: ADVIL Take 1 tablet (600 mg total) by mouth every 8 (eight) hours as needed (severer pain/ headache).   loperamide 1 MG/5ML solution Commonly known as: IMODIUM Take by mouth as needed for diarrhea or loose stools. Teaspoons amounts   memantine 10 MG tablet Commonly known as: Namenda Take 1 tablet (10 mg total) by mouth 2 (two) times daily.   multivitamin tablet Take 1 tablet by mouth daily.   omeprazole 20 MG capsule Commonly known as: PRILOSEC Take by mouth daily.   vitamin C 500 MG tablet Commonly known as: ASCORBIC ACID Take 500 mg by mouth daily.   Vitamin D3 50 MCG (2000 UT) capsule Take 2,000 Units by mouth daily.  Allergies  Allergen Reactions   Ciprofloxacin Other (See Comments)    Unknown (vertigo??)   Citalopram Hydrobromide Other (See Comments)    Intrusive/ odd thoughts    Flagyl [Metronidazole] Other (See Comments)    Unknown??   Oxycodone Other (See Comments)    Headaches    Consultations: None  Procedures/Studies: DG Chest Port 1 View  Result Date: 04/17/2021 CLINICAL DATA:  Generalized weakness, fever, chills, possible sepsis. EXAM: PORTABLE CHEST 1 VIEW COMPARISON:  11/20/2017.  CT chest  01/06/2018. FINDINGS: Trachea is midline. Heart size is accentuated by AP technique. There may be minimal bibasilar subsegmental atelectasis. No airspace consolidation or pleural fluid. IMPRESSION: No acute findings. Electronically Signed   By: Leanna Battles M.D.   On: 04/17/2021 11:00   US Abdomen Limited RUQ (LIVER/GB)  Result Date: 04/17/2021 CLINICAL DATA:  LFT elevation EXAM: ULTRASOUND ABDOMEN LIMITED RIGHT UPPER QUADRANT COMPARISON:  CT 05/02/2018 FINDINGS: Gallbladder: Surgically absent. Common bile duct: Diameter: 7.7 mm, which can be seen after cholecystectomy. No intrahepatic biliary ductal dilation. Liver: Mildly increased liver echogenicity. No focal liver lesion identified. Portal vein is patent on color Doppler imaging with normal direction of blood flow towards the liver. Other: None. IMPRESSION: Mildly increased liver echogenicity, suggesting hepatic steatosis. Mild prominence of the common bile duct, which can be seen after cholecystectomy. No intraductal obstructing lesion identified sonographically. No intrahepatic biliary ductal dilation. Correlate with LFTs. Electronically Signed   By: Caprice Renshaw M.D.   On: 04/17/2021 15:20     Subjective: No acute issues or events overnight   Discharge Exam: Vitals:   04/18/21 0446 04/18/21 0836  BP: (!) 111/55 (!) 112/59  Pulse: 70 75  Resp: 18 14  Temp: (!) 97.5 F (36.4 C) 98.4 F (36.9 C)  SpO2: 97% 96%   Vitals:   04/17/21 2334 04/18/21 0040 04/18/21 0446 04/18/21 0836  BP: (!) 100/56  (!) 111/55 (!) 112/59  Pulse: 74  70 75  Resp:   18 14  Temp: 98.3 F (36.8 C)  (!) 97.5 F (36.4 C) 98.4 F (36.9 C)  TempSrc:   Oral Oral  SpO2: 97%  97% 96%  Weight:  83.9 kg    Height:  5\' 4"  (1.626 m)      General: Pt is alert, awake, not in acute distress Cardiovascular: RRR, S1/S2 +, no rubs, no gallops Respiratory: CTA bilaterally, no wheezing, no rhonchi Abdominal: Soft, NT, ND, bowel sounds + Extremities: no edema, no  cyanosis    The results of significant diagnostics from this hospitalization (including imaging, microbiology, ancillary and laboratory) are listed below for reference.     Microbiology: Recent Results (from the past 240 hour(s))  Resp Panel by RT-PCR (Flu A&B, Covid)     Status: None   Collection Time: 04/17/21 11:30 AM   Specimen: Nasopharyngeal(NP) swabs in vial transport medium  Result Value Ref Range Status   SARS Coronavirus 2 by RT PCR NEGATIVE NEGATIVE Final    Comment: (NOTE) SARS-CoV-2 target nucleic acids are NOT DETECTED.  The SARS-CoV-2 RNA is generally detectable in upper respiratory specimens during the acute phase of infection. The lowest concentration of SARS-CoV-2 viral copies this assay can detect is 138 copies/mL. A negative result does not preclude SARS-Cov-2 infection and should not be used as the sole basis for treatment or other patient management decisions. A negative result may occur with  improper specimen collection/handling, submission of specimen other than nasopharyngeal swab, presence of viral mutation(s) within the areas targeted by this  assay, and inadequate number of viral copies(<138 copies/mL). A negative result must be combined with clinical observations, patient history, and epidemiological information. The expected result is Negative.  Fact Sheet for Patients:  BloggerCourse.com  Fact Sheet for Healthcare Providers:  SeriousBroker.it  This test is no t yet approved or cleared by the [place] FDA and  has been authorized for detection and/or diagnosis of SARS-CoV-2 by FDA under an Emergency Use Authorization (EUA). This EUA will remain  in effect (meaning this test can be used) for the duration of the COVID-19 declaration under Section 564(b)(1) of the Act, 21 U.S.C.section 360bbb-3(b)(1), unless the authorization is terminated  or revoked sooner.       Influenza A by PCR  NEGATIVE NEGATIVE Final   Influenza B by PCR NEGATIVE NEGATIVE Final    Comment: (NOTE) The Xpert Xpress SARS-CoV-2/FLU/RSV plus assay is intended as an aid in the diagnosis of influenza from Nasopharyngeal swab specimens and should not be used as a sole basis for treatment. Nasal washings and aspirates are unacceptable for Xpert Xpress SARS-CoV-2/FLU/RSV testing.  Fact Sheet for Patients: BloggerCourse.com  Fact Sheet for Healthcare Providers: SeriousBroker.it  This test is not yet approved or cleared by the [place] FDA and has been authorized for detection and/or diagnosis of SARS-CoV-2 by FDA under an Emergency Use Authorization (EUA). This EUA will remain in effect (meaning this test can be used) for the duration of the COVID-19 declaration under Section 564(b)(1) of the Act, 21 U.S.C. section 360bbb-3(b)(1), unless the authorization is terminated or revoked.  Performed at [facility], [address], [place], [place] [address]   Blood Culture (routine x 2)     Status: None (Preliminary result)   Collection Time: 04/17/21 11:34 AM   Specimen: BLOOD RIGHT FOREARM  Result Value Ref Range Status   Specimen Description BLOOD RIGHT FOREARM  Final   Special Requests   Final    BOTTLES DRAWN AEROBIC AND ANAEROBIC Blood Culture results may not be optimal due to an inadequate volume of blood received in culture bottles   Culture   Final    NO GROWTH < 24 HOURS Performed at [facility], [address] [place], [place] [address]    Report Status PENDING  Incomplete  Blood Culture (routine x 2)     Status: None (Preliminary result)   Collection Time: 04/17/21  8:33 PM   Specimen: BLOOD  Result Value Ref Range Status   Specimen Description BLOOD RIGHT HAND  Final   Special Requests   Final    BOTTLES DRAWN AEROBIC AND ANAEROBIC Blood Culture adequate volume   Culture   Final    NO GROWTH < 12 HOURS Performed  at [facility], [address], [place], [place] [address]    Report Status PENDING  Incomplete     Labs: BNP (last 3 results) No results for input(s): BNP in the last 8760 hours. Basic Metabolic Panel: Recent Labs  Lab 04/17/21 1109 04/18/21 0746  NA 134* 139  K 3.7 3.6  CL 102 106  CO2 23 25  GLUCOSE 121* 120*  BUN 14 9  CREATININE 1.12* 1.01*  CALCIUM 8.5* 8.9   Liver Function Tests: Recent Labs  Lab 04/17/21 1109 04/18/21 0746  AST 62* 36  ALT 51* 39  ALKPHOS 96 80  BILITOT 1.7* 1.0  PROT 6.5 5.6*  ALBUMIN 3.2* 2.6*   No results for input(s): LIPASE, AMYLASE in the last 168 hours. No results  for input(s): AMMONIA in the last 168 hours. CBC: Recent Labs  Lab 04/17/21 1109 04/18/21 0746  WBC 14.5* 9.7  NEUTROABS 10.9*  --   HGB 14.1 12.4  HCT 41.7 37.2  MCV 93.5 93.5  PLT 225 181   Cardiac Enzymes: No results for input(s): CKTOTAL, CKMB, CKMBINDEX, TROPONINI in the last 168 hours. BNP: Invalid input(s): POCBNP CBG: No results for input(s): GLUCAP in the last 168 hours. D-Dimer No results for input(s): DDIMER in the last 72 hours. Hgb A1c No results for input(s): HGBA1C in the last 72 hours. Lipid Profile No results for input(s): CHOL, HDL, LDLCALC, TRIG, CHOLHDL, LDLDIRECT in the last 72 hours. Thyroid function studies No results for input(s): TSH, T4TOTAL, T3FREE, THYROIDAB in the last 72 hours.  Invalid input(s): FREET3 Anemia work up No results for input(s): VITAMINB12, FOLATE, FERRITIN, TIBC, IRON, RETICCTPCT in the last 72 hours. Urinalysis    Component Value Date/Time   COLORURINE YELLOW 04/17/2021 1253   APPEARANCEUR CLEAR 04/17/2021 1253   LABSPEC 1.004 (L) 04/17/2021 1253   PHURINE 7.0 04/17/2021 1253   GLUCOSEU NEGATIVE 04/17/2021 1253   HGBUR SMALL (A) 04/17/2021 1253   HGBUR negative 10/18/2008 1223   BILIRUBINUR NEGATIVE 04/17/2021 1253   BILIRUBINUR negative 08/08/2020 1518   BILIRUBINUR negative 08/03/2020 1051    KETONESUR NEGATIVE 04/17/2021 1253   PROTEINUR NEGATIVE 04/17/2021 1253   UROBILINOGEN 0.2 08/08/2020 1518   UROBILINOGEN 0.2 04/03/2014 1440   NITRITE POSITIVE (A) 04/17/2021 1253   LEUKOCYTESUR LARGE (A) 04/17/2021 1253   Sepsis Labs Invalid input(s): PROCALCITONIN,  WBC,  LACTICIDVEN Microbiology Recent Results (from the past 240 hour(s))  Resp Panel by RT-PCR (Flu A&B, Covid)     Status: None   Collection Time: 04/17/21 11:30 AM   Specimen: Nasopharyngeal(NP) swabs in vial transport medium  Result Value Ref Range Status   SARS Coronavirus 2 by RT PCR NEGATIVE NEGATIVE Final    Comment: (NOTE) SARS-CoV-2 target nucleic acids are NOT DETECTED.  The SARS-CoV-2 RNA is generally detectable in upper respiratory specimens during the acute phase of infection. The lowest concentration of SARS-CoV-2 viral copies this assay can detect is 138 copies/mL. A negative result does not preclude SARS-Cov-2 infection and should not be used as the sole basis for treatment or other patient management decisions. A negative result may occur with  improper specimen collection/handling, submission of specimen other than nasopharyngeal swab, presence of viral mutation(s) within the areas targeted by this assay, and inadequate number of viral copies(<138 copies/mL). A negative result must be combined with clinical observations, patient history, and epidemiological information. The expected result is Negative.  Fact Sheet for Patients:  BloggerCourse.com  Fact Sheet for Healthcare Providers:  SeriousBroker.it  This test is no t yet approved or cleared by the [place] FDA and  has been authorized for detection and/or diagnosis of SARS-CoV-2 by FDA under an Emergency Use Authorization (EUA). This EUA will remain  in effect (meaning this test can be used) for the duration of the COVID-19 declaration under Section 564(b)(1) of the Act,  21 U.S.C.section 360bbb-3(b)(1), unless the authorization is terminated  or revoked sooner.       Influenza A by PCR NEGATIVE NEGATIVE Final   Influenza B by PCR NEGATIVE NEGATIVE Final    Comment: (NOTE) The Xpert Xpress SARS-CoV-2/FLU/RSV plus assay is intended as an aid in the diagnosis of influenza from Nasopharyngeal swab specimens and should not be used as a sole basis for treatment. Nasal washings and aspirates are unacceptable  for Xpert Xpress SARS-CoV-2/FLU/RSV testing.  Fact Sheet for Patients: BloggerCourse.com  Fact Sheet for Healthcare Providers: SeriousBroker.it  This test is not yet approved or cleared by the [place] FDA and has been authorized for detection and/or diagnosis of SARS-CoV-2 by FDA under an Emergency Use Authorization (EUA). This EUA will remain in effect (meaning this test can be used) for the duration of the COVID-19 declaration under Section 564(b)(1) of the Act, 21 U.S.C. section 360bbb-3(b)(1), unless the authorization is terminated or revoked.  Performed at [facility], [address] [place], [place] [address]   Blood Culture (routine x 2)     Status: None (Preliminary result)   Collection Time: 04/17/21 11:34 AM   Specimen: BLOOD RIGHT FOREARM  Result Value Ref Range Status   Specimen Description BLOOD RIGHT FOREARM  Final   Special Requests   Final    BOTTLES DRAWN AEROBIC AND ANAEROBIC Blood Culture results may not be optimal due to an inadequate volume of blood received in culture bottles   Culture   Final    NO GROWTH < 24 HOURS Performed at [facility], [address], [place], [place] [address]    Report Status PENDING  Incomplete  Blood Culture (routine x 2)     Status: None (Preliminary result)   Collection Time: 04/17/21  8:33 PM   Specimen: BLOOD  Result Value Ref Range Status   Specimen Description BLOOD RIGHT HAND  Final   Special Requests   Final     BOTTLES DRAWN AEROBIC AND ANAEROBIC Blood Culture adequate volume   Culture   Final    NO GROWTH < 12 HOURS Performed at [facility], [address], [place], [place] [address]    Report Status PENDING  Incomplete     Time coordinating discharge: Over 30 minutes  SIGNED:   Azucena Fallen, DO [facility] 04/18/2021, 10:28 AM Pager   If 7PM-7AM, please contact night-coverage [email or web address]

## 2021-04-18 NOTE — Evaluation (Signed)
Physical Therapy Evaluation Patient Details Name: Frances Hoffman MRN: [id number] DOB: [birth year] Today's Date: 04/18/2021  History of Present Illness  Frances Hoffman is a 81 y.o. female with medical history significant of recurrent UTIs, dementia, anxiety/depression, HLD, hypothyroidism came with new onset of fever/chills, lower abdominal pain.  Clinical Impression   Patient evaluated by Physical Therapy with no further acute PT needs identified. All education has been completed and the patient has no further questions.  See below for any follow-up Physical Therapy or equipment needs. PT is signing off. Thank you for this referral.        Recommendations for follow up therapy are one component of a multi-disciplinary discharge planning process, led by the attending physician.  Recommendations may be updated based on patient status, additional functional criteria and insurance authorization.  Follow Up Recommendations No PT follow up    Assistance Recommended at Discharge PRN  Patient can return home with the following       Equipment Recommendations None recommended by PT  Recommendations for Other Services       Functional Status Assessment Patient has not had a recent decline in their functional status     Precautions / Restrictions        Mobility  Bed Mobility Overal bed mobility: Independent                  Transfers Overall transfer level: Independent                      Ambulation/Gait Ambulation/Gait assistance: Independent Gait Distance (Feet): 180 Feet Assistive device: None Gait Pattern/deviations: Step-through pattern       General Gait Details: No difficulty  Stairs            Wheelchair Mobility    Modified Rankin (Stroke Patients Only)       Balance Overall balance assessment: Mild deficits observed, not formally tested                                           Pertinent Vitals/Pain Pain  Assessment Pain Assessment: Faces Faces Pain Scale: Hurts a little bit Pain Location: Headache Pain Descriptors / Indicators: Headache Pain Intervention(s): Monitored during session    Home Living Family/patient expects to be discharged to:: Private residence Living Arrangements: Spouse/significant other Available Help at Discharge: Family Type of Home: House Home Access: Stairs to enter Entrance Stairs-Rails: Right Entrance Stairs-Number of Steps: 4   Home Layout: One level        Prior Function Prior Level of Function : Independent/Modified Independent             Mobility Comments: walks without difficulty without an assistive device ADLs Comments: independent     Hand Dominance        Extremity/Trunk Assessment   Upper Extremity Assessment Upper Extremity Assessment: Overall WFL for tasks assessed    Lower Extremity Assessment Lower Extremity Assessment: Overall WFL for tasks assessed       Communication   Communication: No difficulties  Cognition Arousal/Alertness: Awake/alert Behavior During Therapy: WFL for tasks assessed/performed Overall Cognitive Status: Within Functional Limits for tasks assessed  General Comments General comments (skin integrity, edema, etc.): See other note of this date for O2 sat walk    Exercises     Assessment/Plan    PT Assessment Patient does not need any further PT services  PT Problem List         PT Treatment Interventions      PT Goals (Current goals can be found in the Care Plan section)  Acute Rehab PT Goals Patient Stated Goal: hopes to get home today PT Goal Formulation: All assessment and education complete, DC therapy    Frequency       Co-evaluation               AM-PAC PT "6 Clicks" Mobility  Outcome Measure Help needed turning from your back to your side while in a flat bed without using bedrails?: None Help needed moving  from lying on your back to sitting on the side of a flat bed without using bedrails?: None Help needed moving to and from a bed to a chair (including a wheelchair)?: None Help needed standing up from a chair using your arms (e.g., wheelchair or bedside chair)?: None Help needed to walk in hospital room?: None Help needed climbing 3-5 steps with a railing? : None 6 Click Score: 24    End of Session Equipment Utilized During Treatment: Gait belt Activity Tolerance: Patient tolerated treatment well Patient left: in chair;with call bell/phone within reach Nurse Communication: Mobility status PT Visit Diagnosis: Unsteadiness on feet (R26.81)    Time: 8416-6063 PT Time Calculation (min) (ACUTE ONLY): 17 min   Charges:   PT Evaluation $PT Eval Low Complexity: 1 Low          Van Clines, PT  Acute Rehabilitation Services Pager [phone] Office [phone]   Levi Aland 04/18/2021, 12:45 PM

## 2021-04-18 NOTE — Progress Notes (Signed)
DISCHARGE NOTE HOME [name] to be discharged Home per MD order. Discussed prescriptions and follow up appointments with the patient. Prescriptions given to patient; medication list explained in detail. Patient verbalized understanding.  Skin clean, dry and intact without evidence of skin break down, no evidence of skin tears noted. IV catheter discontinued intact. Site without signs and symptoms of complications. Dressing and pressure applied. Pt denies pain at the site currently. No complaints noted.  Patient free of lines, drains, and wounds.   An After Visit Summary (AVS) was printed and given to the patient. Patient escorted via wheelchair, and discharged home via private auto.  Orville Govern, RN

## 2021-04-18 NOTE — Progress Notes (Signed)
Transition of Care [facility]) Screening Note   Patient Details  Name: Frances Hoffman Date of Birth: 10-23-1940   Transition of Care [facility]) CM/SW Contact:    Tom-Johnson, Hershal Coria, RN Phone Number: 04/18/2021, 10:44 AM  Patient is scheduled for discharge today.Admitted for UTI. From home with husband.Transition of Care Department [facility]) has reviewed patient and no TOC needs or recommendations have been identified at this time. Husband will transport at discharge. No further TOC needs noted.

## 2021-04-18 NOTE — Progress Notes (Addendum)
Physical Therapy Note  PT eval complete with full note to follow;  Recommend dc home for transition out of hospital;  No pressing PT needs identified;  O2 qualifying walk note to follow;    Roney Marion, [place] Pager [phone] Office [phone]   Addendum:   [place]: (This note is used to comply with regulatory documentation for home oxygen)  Patient Saturations on Room Air at Rest = 92%  Patient Saturations on Room Air while Ambulating = 90-95%  Patient Does not require supplemental oxygen to maintain oxygen saturations at acceptable, safe levels with physical activity.   Roney Marion, [place]  Acute Rehabilitation Services Pager [phone] Office [phone]

## 2021-04-19 ENCOUNTER — Ambulatory Visit (INDEPENDENT_AMBULATORY_CARE_PROVIDER_SITE_OTHER): Payer: Medicare HMO | Admitting: Family Medicine

## 2021-04-19 ENCOUNTER — Other Ambulatory Visit: Payer: Self-pay

## 2021-04-19 VITALS — BP 110/72 | HR 79 | Temp 98.4°F | Ht 64.0 in | Wt 181.4 lb

## 2021-04-19 DIAGNOSIS — R69 Illness, unspecified: Secondary | ICD-10-CM | POA: Diagnosis not present

## 2021-04-19 DIAGNOSIS — R209 Unspecified disturbances of skin sensation: Secondary | ICD-10-CM | POA: Insufficient documentation

## 2021-04-19 DIAGNOSIS — E7849 Other hyperlipidemia: Secondary | ICD-10-CM | POA: Diagnosis not present

## 2021-04-19 DIAGNOSIS — F39 Unspecified mood [affective] disorder: Secondary | ICD-10-CM

## 2021-04-19 DIAGNOSIS — N390 Urinary tract infection, site not specified: Secondary | ICD-10-CM | POA: Diagnosis not present

## 2021-04-19 DIAGNOSIS — E039 Hypothyroidism, unspecified: Secondary | ICD-10-CM | POA: Diagnosis not present

## 2021-04-19 LAB — URINE CULTURE: Culture: 5000 — AB

## 2021-04-19 MED ORDER — ESCITALOPRAM OXALATE 20 MG PO TABS: 20.0000 mg | ORAL_TABLET | Freq: Every day | ORAL | 3 refills | Status: DC

## 2021-04-19 MED ORDER — LEVOTHYROXINE SODIUM 75 MCG PO TABS: 75.0000 ug | ORAL_TABLET | Freq: Every day | ORAL | 3 refills | Status: DC

## 2021-04-19 MED ORDER — ATORVASTATIN CALCIUM 40 MG PO TABS: 40.0000 mg | ORAL_TABLET | Freq: Every day | ORAL | 3 refills | Status: DC

## 2021-04-19 NOTE — Assessment & Plan Note (Signed)
PHQ-9 and GAD elevated. Pt taking lexapro 20 mg. Not interested in therapy at this time. Offered changing medication. She feels she is stable and declined intervention at this time. Will continue to monitor. Also suspect recently hospitalization impacting her questionnaire.

## 2021-04-19 NOTE — Assessment & Plan Note (Signed)
Pt notes cold feet and medicare nurse visit failed the circulation screen. Normal pulses today and feet are warm. Will get ABI given symptoms and screening. Discussed PVD prevention and reason behind additional testing.

## 2021-04-19 NOTE — Assessment & Plan Note (Signed)
Recent hospitalization with sepsis suspected 2/2 to UTI given otherwise negative work. Discharged on keflex. However UCx with <100,000 colonies. Discussed continue abx and updating if fevers return as would want to consider repeat Bcx and additional work-up given negative culture.

## 2021-04-19 NOTE — Progress Notes (Signed)
Subjective:     Frances Hoffman is a 81 y.o. female presenting for Cold Extremity (Both ankles get cold. [employer] nurse did a home visit and told pt she had poor circulation. )     HPI  #UTI - c/b sepsis - got severe dehydration and went to the ER with admission - is on abx - Keflex  - also ran a low grade fever yesterday 101 - did try some pills - no respiratory symptoms prior to hospitalization - Ucx with 2 bacteria - but not >100,000 colonies    #Cold ankles - poor circulation - "a while" - ankle and feet getting cold - no other cold symptoms on the body - will also get sweats - no tingling or numbness in the feet - notes the health nurse did a test and was told it was abnormal  - does get some cramping/shooting pain in the right foot with ambulation, no calf pain with walking   Review of Systems   Social History   Tobacco Use  Smoking Status Never  Smokeless Tobacco Never        Objective:    BP Readings from Last 3 Encounters:  04/19/21 110/72  04/18/21 (!) 112/59  02/16/21 (!) 100/58   Wt Readings from Last 3 Encounters:  04/19/21 181 lb 6 oz (82.3 kg)  04/18/21 184 lb 15.5 oz (83.9 kg)  02/16/21 174 lb (78.9 kg)    BP 110/72    Pulse 79    Temp 98.4 F (36.9 C) (Oral)    Ht 5\' 4"  (1.626 m)    Wt 181 lb 6 oz (82.3 kg)    SpO2 95%    BMI 31.13 kg/m    Physical Exam Constitutional:      General: She is not in acute distress.    Appearance: She is well-developed. She is not diaphoretic.  HENT:     Right Ear: External ear normal.     Left Ear: External ear normal.     Nose: Nose normal.  Eyes:     Conjunctiva/sclera: Conjunctivae normal.  Cardiovascular:     Rate and Rhythm: Normal rate and regular rhythm.     Pulses:          Dorsalis pedis pulses are 2+ on the right side and 2+ on the left side.       Posterior tibial pulses are 2+ on the right side and 2+ on the left side.  Pulmonary:     Effort: Pulmonary effort is normal. No  respiratory distress.     Breath sounds: Normal breath sounds. No wheezing.  Abdominal:     General: Abdomen is flat. Bowel sounds are normal. There is no distension.     Palpations: Abdomen is soft.     Tenderness: There is no abdominal tenderness. There is no guarding or rebound.  Musculoskeletal:     Cervical back: Neck supple.     Right lower leg: No edema.     Left lower leg: No edema.  Feet:     Right foot:     Skin integrity: Skin integrity normal.     Left foot:     Skin integrity: Skin integrity normal.     Comments: Wart on the left medial base of the great toe Skin:    General: Skin is warm and dry.     Capillary Refill: Capillary refill takes less than 2 seconds.  Neurological:     Mental Status: She is alert. Mental status  is at baseline.  Psychiatric:        Mood and Affect: Mood normal.        Behavior: Behavior normal.          Assessment & Plan:   Problem List Items Addressed This Visit       Endocrine   Hypothyroidism (Chronic)    Lab Results  Component Value Date   TSH 1.89 08/23/2020  At goal. Continue levo 75 mcg, rechecking in setting of cold feet.       Relevant Medications   levothyroxine (EUTHYROX) 75 MCG tablet     Genitourinary   Recurrent UTI    Recent hospitalization with sepsis suspected 2/2 to UTI given otherwise negative work. Discharged on keflex. However UCx with <100,000 colonies. Discussed continue abx and updating if fevers return as would want to consider repeat Bcx and additional work-up given negative culture.         Other   Hyperlipidemia (Chronic)   Relevant Medications   atorvastatin (LIPITOR) 40 MG tablet   Mood disorder (HCC)    PHQ-9 and GAD elevated. Pt taking lexapro 20 mg. Not interested in therapy at this time. Offered changing medication. She feels she is stable and declined intervention at this time. Will continue to monitor. Also suspect recently hospitalization impacting her questionnaire.        Relevant Medications   escitalopram (LEXAPRO) 20 MG tablet   Cold extremities - Primary    Pt notes cold feet and medicare nurse visit failed the circulation screen. Normal pulses today and feet are warm. Will get ABI given symptoms and screening. Discussed PVD prevention and reason behind additional testing.       Relevant Orders   VAS [place] LE ART SEG MULTI ([facility])     No follow-ups on file.  [name], MD  This visit occurred during the SARS-CoV-2 public health emergency.  Safety protocols were in place, including screening questions prior to the visit, additional usage of staff PPE, and extensive cleaning of exam room while observing appropriate contact time as indicated for disinfecting solutions.

## 2021-04-19 NOTE — Assessment & Plan Note (Addendum)
Lab Results  Component Value Date   TSH 1.89 08/23/2020   At goal. Continue levo 75 mcg, rechecking in setting of cold feet.

## 2021-04-22 LAB — CULTURE, BLOOD (ROUTINE X 2)
Culture: NO GROWTH
Culture: NO GROWTH
Special Requests: ADEQUATE

## 2021-05-02 ENCOUNTER — Other Ambulatory Visit: Payer: Self-pay

## 2021-05-02 ENCOUNTER — Other Ambulatory Visit (INDEPENDENT_AMBULATORY_CARE_PROVIDER_SITE_OTHER): Payer: Medicare HMO

## 2021-05-02 DIAGNOSIS — E7849 Other hyperlipidemia: Secondary | ICD-10-CM | POA: Diagnosis not present

## 2021-05-02 LAB — LIPID PANEL
Cholesterol: 175 mg/dL (ref 0–200)
HDL: 48.9 mg/dL (ref >39.00)
LDL Cholesterol: 98 mg/dL (ref 0–99)
NonHDL: 126.26
Total CHOL/HDL Ratio: 4
Triglycerides: 140 mg/dL (ref 0.0–149.0)
VLDL: 28 mg/dL (ref 0.0–40.0)

## 2021-05-10 ENCOUNTER — Ambulatory Visit (HOSPITAL_COMMUNITY)
Admission: RE | Admit: 2021-05-10 | Discharge: 2021-05-10 | Disposition: A | Discharge status: Home / Self Care | Payer: Medicare HMO | Source: Ambulatory Visit | Attending: Cardiovascular Disease | Admitting: Cardiovascular Disease

## 2021-05-10 ENCOUNTER — Other Ambulatory Visit: Payer: Self-pay

## 2021-05-10 DIAGNOSIS — R209 Unspecified disturbances of skin sensation: Secondary | ICD-10-CM | POA: Diagnosis not present

## 2021-05-16 ENCOUNTER — Other Ambulatory Visit: Payer: Self-pay | Admitting: Family Medicine

## 2021-05-16 DIAGNOSIS — Z1231 Encounter for screening mammogram for malignant neoplasm of breast: Secondary | ICD-10-CM

## 2021-06-14 ENCOUNTER — Ambulatory Visit (INDEPENDENT_AMBULATORY_CARE_PROVIDER_SITE_OTHER): Payer: Medicare HMO | Admitting: Family Medicine

## 2021-06-14 ENCOUNTER — Encounter: Payer: Self-pay | Admitting: Family Medicine

## 2021-06-14 DIAGNOSIS — B9789 Other viral agents as the cause of diseases classified elsewhere: Secondary | ICD-10-CM | POA: Insufficient documentation

## 2021-06-14 DIAGNOSIS — J988 Other specified respiratory disorders: Secondary | ICD-10-CM | POA: Diagnosis not present

## 2021-06-14 NOTE — Progress Notes (Signed)
?Tommi Rumps, MD ?Phone: [phone] ? ?Frances Hoffman is a 81 y.o. female who presents today for same-day visit. ? ?Congestion: Patient notes onset of symptoms yesterday.  She has nasal and sinus congestion.  She has chronic rhinorrhea.  She is not blowing much out of her nose.  She has a bitemporal headache.  She has felt warm.  She has a nonproductive cough.  She has some sore throat.  She has no changes to her taste or smell.  No postnasal drip.  No sick contacts.  They report she took a home COVID test that was negative earlier today.  She is fully vaccinated for COVID. ? ?Social History  ? ?Tobacco Use  ?Smoking Status Never  ?Smokeless Tobacco Never  ? ? ?Current Outpatient Medications on File Prior to Visit  ?Medication Sig Dispense Refill  ? atorvastatin (LIPITOR) 40 MG tablet Take 1 tablet (40 mg total) by mouth daily. For cholesterol. 90 tablet 3  ? b complex vitamins tablet Take 1 tablet by mouth daily.    ? calcium carbonate (OS-CAL - DOSED IN MG OF ELEMENTAL CALCIUM) 1250 (500 CA) MG tablet Take 1 tablet by mouth daily.    ? Calcium Citrate (CITRACAL PO) Take by mouth.    ? carboxymethylcellulose (REFRESH PLUS) 0.5 % SOLN Place 1 drop into both eyes 3 (three) times daily as needed (for dryness).    ? Cholecalciferol (VITAMIN D3) 2000 UNITS capsule Take 2,000 Units by mouth daily.    ? Cranberry 125 MG TABS Take 2 tablets by mouth.    ? cyanocobalamin ([employer] VITAMIN B12) 2000 MCG tablet Take 1 tablet (2,000 mcg total) by mouth daily.    ? donepezil (ARICEPT) 10 MG tablet Take 1 tablet (10 mg total) by mouth at bedtime. 90 tablet 3  ? escitalopram (LEXAPRO) 20 MG tablet Take 1 tablet (20 mg total) by mouth daily. 90 tablet 3  ? fluticasone (FLONASE) 50 MCG/ACT nasal spray 1 spray 2 times daily after sinus rinses- Neil med or AYR 16 g 6  ? ibuprofen (ADVIL,MOTRIN) 600 MG tablet Take 1 tablet (600 mg total) by mouth every 8 (eight) hours as needed (severer pain/ headache). 10 tablet 0  ? levothyroxine  (EUTHYROX) 75 MCG tablet Take 1 tablet (75 mcg total) by mouth daily. 90 tablet 3  ? loperamide (IMODIUM) 1 MG/5ML solution Take by mouth as needed for diarrhea or loose stools. Teaspoons amounts    ? memantine (NAMENDA) 10 MG tablet Take 1 tablet (10 mg total) by mouth 2 (two) times daily. 60 tablet 11  ? Multiple Vitamin (MULTIVITAMIN) tablet Take 1 tablet by mouth daily.    ? Omega-3 Fatty Acids (FISH OIL PO) Take 2 capsules by mouth 2 (two) times daily.    ? omeprazole (PRILOSEC) 20 MG capsule Take by mouth daily.    ? vitamin C (ASCORBIC ACID) 500 MG tablet Take 500 mg by mouth daily. (Patient not taking: Reported on 06/14/2021)    ? ?No current facility-administered medications on file prior to visit.  ? ? ? ?ROS see history of present illness ? ?Objective ? ?Physical Exam ?Vitals:  ? 06/14/21 1519  ?BP: (!) 110/58  ?Pulse: 85  ?Resp: 12  ?Temp: 98.6 ?F (37 ?C)  ?SpO2: 95%  ? ? ?BP Readings from Last 3 Encounters:  ?06/14/21 (!) 110/58  ?04/19/21 110/72  ?04/18/21 (!) 112/59  ? ?Wt Readings from Last 3 Encounters:  ?06/14/21 179 lb (81.2 kg)  ?04/19/21 181 lb 6 oz (82.3 kg)  ?  04/18/21 184 lb 15.5 oz (83.9 kg)  ? ? ?Physical Exam ?Constitutional:   ?   General: She is not in acute distress. ?   Appearance: She is not diaphoretic.  ?HENT:  ?   Right Ear: Tympanic membrane normal.  ?   Left Ear: Tympanic membrane normal.  ?   Mouth/Throat:  ?   Mouth: Mucous membranes are moist.  ?   Pharynx: Oropharynx is clear.  ?Cardiovascular:  ?   Rate and Rhythm: Normal rate and regular rhythm.  ?   Heart sounds: Normal heart sounds.  ?Pulmonary:  ?   Effort: Pulmonary effort is normal.  ?   Breath sounds: Normal breath sounds.  ?Skin: ?   General: Skin is warm and dry.  ?Neurological:  ?   Mental Status: She is alert.  ? ? ? ?Assessment/Plan: Please see individual problem list. ? ?Problem List Items Addressed This Visit   ? ? Viral respiratory illness  ?  Concerning for COVID versus some other virus.  Discussed supportive  care with Flonase for congestion.  She can use Claritin over-the-counter as well.  Discussed use of Tylenol or Aleve to help with headaches and other discomfort.  We will test her for COVID today.  She was advised to stay at home until we get the COVID test results.  Discussed that we would contact her with those results and then discuss potential treatment options if those were positive.  If her COVID test is negative treatment is supportive.  She was advised of reasons to seek medical attention moving forward. ?  ?  ? Relevant Orders  ? Novel Coronavirus, NAA ([facility])  ? ? ? ?Return if symptoms worsen or fail to improve. ? ?This visit occurred during the SARS-CoV-2 public health emergency.  Safety protocols were in place, including screening questions prior to the visit, additional usage of staff PPE, and extensive cleaning of exam room while observing appropriate contact time as indicated for disinfecting solutions.  ? ? ?Tommi Rumps, MD ?[place] ? ?

## 2021-06-14 NOTE — Assessment & Plan Note (Signed)
Concerning for COVID versus some other virus.  Discussed supportive care with Flonase for congestion.  She can use Claritin over-the-counter as well.  Discussed use of Tylenol or Aleve to help with headaches and other discomfort.  We will test her for COVID today.  She was advised to stay at home until we get the COVID test results.  Discussed that we would contact her with those results and then discuss potential treatment options if those were positive.  If her COVID test is negative treatment is supportive.  She was advised of reasons to seek medical attention moving forward. ?

## 2021-06-14 NOTE — Patient Instructions (Signed)
Nice to meet you. ?I am concerned that you have a viral illness.  We are going to test you for COVID and we will contact you with the results. ?You should stay home until we contact you with the COVID results. ?You should start using Flonase to help with congestion symptoms.  You could also try starting on Claritin as that may be beneficial. ?You can take 1000 mg of Tylenol every 6-8 hours as needed for your headache.  You could also try over-the-counter Aleve 1 tablet twice daily if needed.  You should take that with food if you do take Aleve.  If you need this for more than a few days you should let us know as we will need to monitor your kidney function. ?If you develop shortness of breath, cough productive of blood, or fevers please seek medical attention. ?

## 2021-06-15 LAB — NOVEL CORONAVIRUS, NAA: SARS-CoV-2, NAA: DETECTED — AB

## 2021-06-16 ENCOUNTER — Telehealth: Payer: Self-pay | Admitting: *Deleted

## 2021-06-16 NOTE — Telephone Encounter (Signed)
Pt called because a lab results "popped up" on her mychart. Pt said she doesn't know how to read mychart but knows she has some type of result and wants someone to discuss it with her. Pt saw Dr. Caryl Bis, looks like covid test was positive but provider hasn't addressed it yet.  ? ?Will route to Dr. Caryl Bis and PCP so someone can f/u with pt ?

## 2021-06-16 NOTE — Telephone Encounter (Signed)
I called and spoke with the patient regarding her positive COVID test.  We discussed treatment options including Paxlovid and molnupiravir and the purpose of these and reducing risk of death and hospitalization.  The patient declined treatment at this time and opted to let the illness run its course.  Discussed supportive care with over-the-counter Tylenol or ibuprofen and antihistamines and her Flonase.  Advised to contact us if she starts to feel worse.  Discussed seeking medical attention if she had high fever or shortness of breath.  Advised that she needs to stay quarantined at least through 06/18/2021 and if her symptoms have resolved by then she can come off of quarantine and wear a mask around people for the next 5 days.  Discussed if she is not improving by then she should stay quarantined through 06/23/2021.  She was encouraged to contact us with any questions or concerns regarding this diagnosis. ?

## 2021-06-19 ENCOUNTER — Ambulatory Visit: Payer: Medicare HMO

## 2021-06-19 ENCOUNTER — Telehealth: Payer: Self-pay

## 2021-06-19 NOTE — Telephone Encounter (Signed)
[facility] - Client ?Nonclinical Telephone Record  ?[facility]? ?Client [place] [facility] - Client ?Client Site [place] ?Provider AA - PHYSICIAN, UNKNOWN- MD ?Contact Type Call ?Who Is Calling Patient / Member / Family / Caregiver ?[name] Name Sorina Derrig ?[name] Phone Number [phone] ?Patient Name Frances Hoffman ?Patient DOB 11-29-1940 ?Call Type Message Only Information Provided ?Reason for Call Request for General Office Information ?Initial Comment [name] states [facility] appointment [facility] Is trying to call them, but does not know ?how. has Bracey and wants to cancel the appointment. ?Additional Comment Call back request please Thank you ?Disp. Time Disposition Final User ?06/16/2021 5:14:06 PM General Information Provided Yes Margaretmary Bayley ?Call Closed By: Margaretmary Bayley ?Transaction Date/Time: 06/16/2021 5:12:07 PM (ET ?

## 2021-07-06 ENCOUNTER — Encounter: Payer: Self-pay | Admitting: Family

## 2021-07-06 ENCOUNTER — Ambulatory Visit (INDEPENDENT_AMBULATORY_CARE_PROVIDER_SITE_OTHER): Payer: Medicare HMO | Admitting: Family

## 2021-07-06 VITALS — BP 100/60 | HR 74 | Temp 98.3°F | Resp 16 | Ht 64.0 in | Wt 178.2 lb

## 2021-07-06 DIAGNOSIS — J3489 Other specified disorders of nose and nasal sinuses: Secondary | ICD-10-CM | POA: Diagnosis not present

## 2021-07-06 DIAGNOSIS — J329 Chronic sinusitis, unspecified: Secondary | ICD-10-CM

## 2021-07-06 DIAGNOSIS — R0982 Postnasal drip: Secondary | ICD-10-CM

## 2021-07-06 DIAGNOSIS — J301 Allergic rhinitis due to pollen: Secondary | ICD-10-CM | POA: Insufficient documentation

## 2021-07-06 DIAGNOSIS — R053 Chronic cough: Secondary | ICD-10-CM | POA: Diagnosis not present

## 2021-07-06 MED ORDER — FLUTICASONE PROPIONATE 50 MCG/ACT NA SUSP: NASAL | 6 refills | Status: DC

## 2021-07-06 NOTE — Progress Notes (Signed)
? ?Established Patient Office Visit ? ?Subjective:  ?Patient ID: Frances Hoffman, female    DOB: 1940-04-10  Age: 81 y.o. MRN: [id number] ? ?CC:  ?Chief Complaint  ?Patient presents with  ? Ear Pain  ?  Right X few weeks  ? ? ?HPI ?Frances Hoffman is here today with concerns.  ? ?A few weeks ago started with right ear pain, fullness. Feels like water in her ear, not sure.  ?Does use qtip at times. Does have constant rhinorrhea.  ?Does have pnd. No cough.  ? ?Did have covid about 3-4 weeks.  ? ?Past Medical History:  ?Diagnosis Date  ? Adjustment disorder with mixed anxiety and depressed mood 12/12/2007  ? Qualifier: Diagnosis of  By: Copland MD, Karleen Hampshire    ? Breast cancer (HCC)   ? Cancer (HCC) 2000  ? breast had lumpectomy/radiation x36,mammo ,no chemo  ? Cataract of both eyes   ? Depression   ? Dysuria-frequency syndrome   ? takes AZO  ? Frozen shoulder   ? Gall stones   ? GERD 12/12/2007  ? Qualifier: Diagnosis of  By: Copland MD, Karleen Hampshire    ? Hyperlipidemia   ? Hypothyroidism 12/12/2007  ? Qualifier: Diagnosis of  By: Copland MD, Karleen Hampshire    ? Memory difficulties 09/27/2017  ? Memory loss   ? Memory loss 09/22/2014  ? Osteopenia   ? BMD 2004, WNL 2008  ? Personal history of radiation therapy   ? Pneumonia   ? Recurrent UTI   ? Shingles   ? chronic body pain, left side of body  ? Shortness of breath dyspnea   ? when climbing stairs only  ? Wears glasses   ? ? ?Past Surgical History:  ?Procedure Laterality Date  ? BREAST LUMPECTOMY Left 2000  ? CHOLECYSTECTOMY N/A 02/21/2015  ? Procedure: LAPAROSCOPIC CHOLECYSTECTOMY WITH INTRAOPERATIVE CHOLANGIOGRAM;  Surgeon: Gaynelle Adu, MD;  [facility];  Service: General;  Laterality: N/A;  ? COLONOSCOPY    ? MULTIPLE TOOTH EXTRACTIONS    ? ? ?Family History  ?Problem Relation Age of Onset  ? Osteoporosis Mother   ? Parkinsonism Father   ? Breast cancer Sister 18  ? Breast cancer Other   ? Coronary artery disease Sister   ? Ovarian cancer Sister   ? Heart disease Maternal  Grandmother   ? Heart disease Maternal Grandfather   ? Heart attack Maternal Grandfather 50  ? Heart disease Son   ? Heart attack Son 81  ? ? ?Social History  ? ?Socioeconomic History  ? Marital status: Married  ?  Spouse name: Frances Hoffman   ? Number of children: 2  ? Years of education: High school  ? Highest education level: High school graduate  ?Occupational History  ? Not on file  ?Tobacco Use  ? Smoking status: Never  ? Smokeless tobacco: Never  ?Vaping Use  ? Vaping Use: Never used  ?Substance and Sexual Activity  ? Alcohol use: No  ?  Alcohol/week: 0.0 standard drinks  ? Drug use: No  ? Sexual activity: Not Currently  ?  Birth control/protection: Post-menopausal  ?Other Topics Concern  ? Not on file  ?Social History Narrative  ? Lives at home with her husband. Frances Hoffman  ? 2 adult sons  - Frances and Frances Hoffman  ? 2 Grandchildren - both other  ? Exercise: goes to [employer] sne[employer] - at least 2 times a week  ? Diet: anything that tastes good, less pepsi's with the tongue  ?  Enjoys: square dance, eating out with friends, going to church - Sunday and Wednesday  ? ?Social Determinants of Health  ? ?Financial Resource Strain: Low Risk   ? Difficulty of Paying Living Expenses: Not hard at all  ?Food Insecurity: No Food Insecurity  ? Worried About Programme researcher, broadcasting/film/video in the Last Year: Never true  ? Ran Out of Food in the Last Year: Never true  ?Transportation Needs: No Transportation Needs  ? Lack of Transportation (Medical): No  ? Lack of Transportation (Non-Medical): No  ?Physical Activity: Insufficiently Active  ? Days of Exercise per Week: 2 days  ? Minutes of Exercise per Session: 30 min  ?Stress: No Stress Concern Present  ? Feeling of Stress : Not at all  ?Social Connections: Socially Integrated  ? Frequency of Communication with Friends and Family: Three times a week  ? Frequency of Social Gatherings with Friends and Family: Three times a week  ? Attends Religious Services: More than 4 times per year  ? Active Member of  Clubs or Organizations: Yes  ? Attends Banker Meetings: More than 4 times per year  ? Marital Status: Married  ?Intimate Partner Violence: Not At Risk  ? Fear of Current or Ex-Partner: No  ? Emotionally Abused: No  ? Physically Abused: No  ? Sexually Abused: No  ? ? ?Outpatient Medications Prior to Visit  ?Medication Sig Dispense Refill  ? atorvastatin (LIPITOR) 40 MG tablet Take 1 tablet (40 mg total) by mouth daily. For cholesterol. 90 tablet 3  ? b complex vitamins tablet Take 1 tablet by mouth daily.    ? calcium carbonate (OS-CAL - DOSED IN MG OF ELEMENTAL CALCIUM) 1250 (500 CA) MG tablet Take 1 tablet by mouth daily.    ? Calcium Citrate (CITRACAL PO) Take by mouth.    ? carboxymethylcellulose (REFRESH PLUS) 0.5 % SOLN Place 1 drop into both eyes 3 (three) times daily as needed (for dryness).    ? Cholecalciferol (VITAMIN D3) 2000 UNITS capsule Take 2,000 Units by mouth daily.    ? Cranberry 125 MG TABS Take 2 tablets by mouth.    ? cyanocobalamin ([employer] VITAMIN B12) 2000 MCG tablet Take 1 tablet (2,000 mcg total) by mouth daily.    ? donepezil (ARICEPT) 10 MG tablet Take 1 tablet (10 mg total) by mouth at bedtime. 90 tablet 3  ? escitalopram (LEXAPRO) 20 MG tablet Take 1 tablet (20 mg total) by mouth daily. 90 tablet 3  ? ibuprofen (ADVIL,MOTRIN) 600 MG tablet Take 1 tablet (600 mg total) by mouth every 8 (eight) hours as needed (severer pain/ headache). 10 tablet 0  ? levothyroxine (EUTHYROX) 75 MCG tablet Take 1 tablet (75 mcg total) by mouth daily. 90 tablet 3  ? loperamide (IMODIUM) 1 MG/5ML solution Take by mouth as needed for diarrhea or loose stools. Teaspoons amounts    ? memantine (NAMENDA) 10 MG tablet Take 1 tablet (10 mg total) by mouth 2 (two) times daily. 60 tablet 11  ? Multiple Vitamin (MULTIVITAMIN) tablet Take 1 tablet by mouth daily.    ? Omega-3 Fatty Acids (FISH OIL PO) Take 2 capsules by mouth 2 (two) times daily.    ? omeprazole (PRILOSEC) 20 MG capsule Take by mouth daily.     ? vitamin C (ASCORBIC ACID) 500 MG tablet Take 500 mg by mouth daily.    ? fluticasone (FLONASE) 50 MCG/ACT nasal spray 1 spray 2 times daily after sinus rinses- Lloyd Huger med or AYR 16  g 6  ? ?No facility-administered medications prior to visit.  ? ? ?Allergies  ?Allergen Reactions  ? Ciprofloxacin Other (See Comments)  ?  Unknown (vertigo??)  ? Citalopram Hydrobromide Other (See Comments)  ?  Intrusive/ odd thoughts   ? Flagyl [Metronidazole] Other (See Comments)  ?  Unknown??  ? Oxycodone Other (See Comments)  ?  Headaches  ? ? ?ROS ?Review of Systems  ?Constitutional:  Negative for chills and fever.  ?HENT:  Positive for ear pain (right ear pain/fullness), postnasal drip and rhinorrhea. Negative for congestion, sinus pressure and sore throat.   ?Respiratory:  Negative for cough, shortness of breath and wheezing.   ?Cardiovascular:  Negative for chest pain and palpitations.  ? ?  ?Objective:  ?  ?Physical Exam ?Constitutional:   ?   General: She is not in acute distress. ?   Appearance: Normal appearance. She is obese. She is not ill-appearing, toxic-appearing or diaphoretic.  ?HENT:  ?   Head: Normocephalic.  ?   Right Ear: Tympanic membrane normal.  ?   Left Ear: No tenderness. A middle ear effusion (clear) is present.  ?   Ears:  ?   Comments: Slight ear wax right canal removed easily with curette, well tolerated by pt. ?   Nose: Nose normal.  ?Eyes:  ?   Pupils: Pupils are equal, round, and reactive to light.  ?Cardiovascular:  ?   Rate and Rhythm: Normal rate and regular rhythm.  ?Pulmonary:  ?   Effort: Pulmonary effort is normal.  ?Neurological:  ?   General: No focal deficit present.  ?   Mental Status: She is alert and oriented to person, place, and time.  ?Psychiatric:     ?   Mood and Affect: Mood normal.     ?   Behavior: Behavior normal.     ?   Thought Content: Thought content normal.     ?   Judgment: Judgment normal.  ? ? ?BP 100/60   Pulse 74   Temp 98.3 ?F (36.8 ?C)   Resp 16   Ht 5\' 4"  (1.626  m)   Wt 178 lb 3 oz (80.8 kg)   SpO2 97%   BMI 30.59 kg/m?  ?Wt Readings from Last 3 Encounters:  ?07/06/21 178 lb 3 oz (80.8 kg)  ?06/14/21 179 lb (81.2 kg)  ?04/19/21 181 lb 6 oz (82.3 kg)  ? ? ? ?[facility]

## 2021-07-06 NOTE — Assessment & Plan Note (Signed)
Pt to start flonase ?

## 2021-07-06 NOTE — Patient Instructions (Signed)
Recommend starting flonase once daily as well as allegra nightly.  ? ?It was a pleasure speaking with you today, I hope you start feeling better soon. ? ?Regards,  ? ?Adilynne Fitzwater ? ?

## 2021-07-06 NOTE — Assessment & Plan Note (Signed)
Recommend pt start daily allegra and flonase 50 mcg  ?suspected allergies, lingering ear effusion from covid. This will assist. ?

## 2021-07-26 ENCOUNTER — Encounter: Payer: Self-pay | Admitting: Internal Medicine

## 2021-07-26 ENCOUNTER — Ambulatory Visit (INDEPENDENT_AMBULATORY_CARE_PROVIDER_SITE_OTHER): Payer: Medicare HMO | Admitting: Internal Medicine

## 2021-07-26 DIAGNOSIS — H6981 Other specified disorders of Eustachian tube, right ear: Secondary | ICD-10-CM | POA: Diagnosis not present

## 2021-07-26 MED ORDER — PREDNISONE 20 MG PO TABS: 40.0000 mg | ORAL_TABLET | Freq: Every day | ORAL | 0 refills | Status: DC

## 2021-07-26 NOTE — Patient Instructions (Signed)
Let me know if you are not feeling better by next week--we will consider either a trial of antibiotic or ENT referral

## 2021-07-26 NOTE — Assessment & Plan Note (Signed)
Discussed that no clear infection now---but persistent symptoms may indicate low level sinus infection or even OM (though not inflamed). Will try prednisone for 3 days--discussed appetite and sleep If no response, would consider 3-5 day course augmentin Also may need ENT evaluation

## 2021-07-26 NOTE — Progress Notes (Signed)
Subjective:    Patient ID: Frances Hoffman, female    DOB: [birth year], 81 y.o.   MRN: [id number]  HPI Here due to ongoing right ear pain  Seen here 3 weeks ago Had sinus drainage and ear pain---Rx did help this (flonase) Doesn't remember past spring allergy issues Still has right ear pain---and "it feels like there is water in there"  Doesn't feel sick now No fever No cough No major post nasal drip  Current Outpatient Medications on File Prior to Visit  Medication Sig Dispense Refill   atorvastatin (LIPITOR) 40 MG tablet Take 1 tablet (40 mg total) by mouth daily. For cholesterol. 90 tablet 3   b complex vitamins tablet Take 1 tablet by mouth daily.     carboxymethylcellulose (REFRESH PLUS) 0.5 % SOLN Place 1 drop into both eyes 3 (three) times daily as needed (for dryness).     Cholecalciferol (VITAMIN D3) 2000 UNITS capsule Take 2,000 Units by mouth daily.     Cranberry 125 MG TABS Take 2 tablets by mouth.     cyanocobalamin (CVS VITAMIN B12) 2000 MCG tablet Take 1 tablet (2,000 mcg total) by mouth daily.     donepezil (ARICEPT) 10 MG tablet Take 1 tablet (10 mg total) by mouth at bedtime. 90 tablet 3   escitalopram (LEXAPRO) 20 MG tablet Take 1 tablet (20 mg total) by mouth daily. 90 tablet 3   fluticasone (FLONASE) 50 MCG/ACT nasal spray 1 spray 2 times daily after sinus rinses- Neil med or AYR 16 g 6   ibuprofen (ADVIL,MOTRIN) 600 MG tablet Take 1 tablet (600 mg total) by mouth every 8 (eight) hours as needed (severer pain/ headache). 10 tablet 0   levothyroxine (EUTHYROX) 75 MCG tablet Take 1 tablet (75 mcg total) by mouth daily. 90 tablet 3   loperamide (IMODIUM) 1 MG/5ML solution Take by mouth as needed for diarrhea or loose stools. Teaspoons amounts     memantine (NAMENDA) 10 MG tablet Take 1 tablet (10 mg total) by mouth 2 (two) times daily. 60 tablet 11   Multiple Vitamin (MULTIVITAMIN) tablet Take 1 tablet by mouth daily.     Omega-3 Fatty Acids (FISH OIL PO) Take 2  capsules by mouth 2 (two) times daily.     omeprazole (PRILOSEC) 20 MG capsule Take by mouth daily.     vitamin C (ASCORBIC ACID) 500 MG tablet Take 500 mg by mouth daily.     calcium carbonate (OS-CAL - DOSED IN MG OF ELEMENTAL CALCIUM) 1250 (500 CA) MG tablet Take 1 tablet by mouth daily.     Calcium Citrate (CITRACAL PO) Take by mouth.     No current facility-administered medications on file prior to visit.    Allergies  Allergen Reactions   Ciprofloxacin Other (See Comments)    Unknown (vertigo??)   Citalopram Hydrobromide Other (See Comments)    Intrusive/ odd thoughts    Flagyl [Metronidazole] Other (See Comments)    Unknown??   Oxycodone Other (See Comments)    Headaches    Past Medical History:  Diagnosis Date   Adjustment disorder with mixed anxiety and depressed mood 12/12/2007   Qualifier: Diagnosis of  By: Copland MD, Spencer     Breast cancer (HCC)    Cancer (HCC) 2000   breast had lumpectomy/radiation x36,mammo ,no chemo   Cataract of both eyes    Depression    Dysuria-frequency syndrome    takes AZO   Frozen shoulder    Gall stones  GERD 12/12/2007   Qualifier: Diagnosis of  By: Copland MD, Spencer     Hyperlipidemia    Hypothyroidism 12/12/2007   Qualifier: Diagnosis of  By: Patsy Lager MD, Spencer     Memory difficulties 09/27/2017   Memory loss    Memory loss 09/22/2014   Osteopenia    BMD 2004, WNL 2008   Personal history of radiation therapy    Pneumonia    Recurrent UTI    Shingles    chronic body pain, left side of body   Shortness of breath dyspnea    when climbing stairs only   Wears glasses     Past Surgical History:  Procedure Laterality Date   BREAST LUMPECTOMY Left 2000   CHOLECYSTECTOMY N/A 02/21/2015   Procedure: LAPAROSCOPIC CHOLECYSTECTOMY WITH INTRAOPERATIVE CHOLANGIOGRAM;  Surgeon: Gaynelle Adu, MD;  Location: [facility] OR;  Service: General;  Laterality: N/A;   COLONOSCOPY     MULTIPLE TOOTH EXTRACTIONS      Family History  Problem  Relation Age of Onset   Osteoporosis Mother    Parkinsonism Father    Breast cancer Sister 86   Breast cancer Other    Coronary artery disease Sister    Ovarian cancer Sister    Heart disease Maternal Grandmother    Heart disease Maternal Grandfather    Heart attack Maternal Grandfather 50   Heart disease Son    Heart attack Son 71    Social History   Socioeconomic History   Marital status: Married    Spouse name: Financial planner    Number of children: 2   Years of education: High school   Highest education level: High school graduate  Occupational History   Not on file  Tobacco Use   Smoking status: Never   Smokeless tobacco: Never  Vaping Use   Vaping Use: Never used  Substance and Sexual Activity   Alcohol use: No    Alcohol/week: 0.0 standard drinks   Drug use: No   Sexual activity: Not Currently    Birth control/protection: Post-menopausal  Other Topics Concern   Not on file  Social History Narrative   Lives at home with her husband. [name]   2 adult sons  - Mercer and Minerva Areola   2 Grandchildren - both other   Exercise: goes to silver sneakers - at least 2 times a week   Diet: anything that tastes good, less pepsi's with the tongue   Enjoys: square dance, eating out with friends, going to church - Sunday and Wednesday   Social Determinants of Health   Financial Resource Strain: Low Risk    Difficulty of Paying Living Expenses: Not hard at all  Food Insecurity: No Food Insecurity   Worried About Programme researcher, broadcasting/film/video in the Last Year: Never true   Barista in the Last Year: Never true  Transportation Needs: No Transportation Needs   Lack of Transportation (Medical): No   Lack of Transportation (Non-Medical): No  Physical Activity: Insufficiently Active   Days of Exercise per Week: 2 days   Minutes of Exercise per Session: 30 min  Stress: No Stress Concern Present   Feeling of Stress : Not at all  Social Connections: Socially Integrated   Frequency of  Communication with Friends and Family: Three times a week   Frequency of Social Gatherings with Friends and Family: Three times a week   Attends Religious Services: More than 4 times per year   Active Member of Clubs or Organizations: Yes  Attends Engineer, structural: More than 4 times per year   Marital Status: Married  Catering manager Violence: Not At Risk   Fear of Current or Ex-Partner: No   Emotionally Abused: No   Physically Abused: No   Sexually Abused: No   Review of Systems Hearing seems okay No tinnitus     Objective:   Physical Exam Constitutional:      Appearance: Normal appearance.  HENT:     Head:     Comments: No sinus tenderness    Ears:     Comments: Left TM partially obscurred but looks okay Right TM has central area suggesting fluid--but not really bulging. Outer area is opaque    Nose:     Comments: Mild nasal congestion    Mouth/Throat:     Pharynx: No oropharyngeal exudate or posterior oropharyngeal erythema.  Neurological:     Mental Status: She is alert.           Assessment & Plan:

## 2021-08-01 ENCOUNTER — Telehealth: Payer: Self-pay | Admitting: Family Medicine

## 2021-08-01 MED ORDER — AMOXICILLIN-POT CLAVULANATE 875-125 MG PO TABS: 1.0000 | ORAL_TABLET | Freq: Two times a day (BID) | ORAL | 0 refills | Status: DC

## 2021-08-01 MED ORDER — FLUCONAZOLE 150 MG PO TABS: 150.0000 mg | ORAL_TABLET | ORAL | 1 refills | Status: DC

## 2021-08-01 NOTE — Telephone Encounter (Signed)
Pt saw Dr Silvio Pate on 07/26/21 and said he prescribed a zpak but it didn't work and said that he said something about sending in antibiotic if it didn't work and also to send in something for yeast infection because she gets one everytime she takes antibiotic. Please advise. Call back [phone]

## 2021-08-01 NOTE — Telephone Encounter (Signed)
Spoke to pt

## 2021-08-01 NOTE — Telephone Encounter (Signed)
It was prednisone we tried and now I sent augmentin since that didn't work. I sent a week, but if she is better quickly, she can stop after 3-5 days I sent fluconazole for weekly use if she gets yeast infection

## 2021-08-01 NOTE — Telephone Encounter (Signed)
Spoke to pt. She said there is no change in the right ear on the Z-pak. Still feels full.

## 2021-08-09 ENCOUNTER — Telehealth: Payer: Self-pay

## 2021-08-09 DIAGNOSIS — H6981 Other specified disorders of Eustachian tube, right ear: Secondary | ICD-10-CM

## 2021-08-09 DIAGNOSIS — J329 Chronic sinusitis, unspecified: Secondary | ICD-10-CM

## 2021-08-09 NOTE — Telephone Encounter (Signed)
I've placed the referral.   #Referral I have placed a referral to a specialist for you. You should receive a phone call from the specialty office. Make sure your voicemail is not full and that if you are able to answer your phone to unknown or new numbers.   It may take up to 2 weeks to hear about the referral. If you do not hear anything in 2 weeks, please call our office and ask to speak with the referral coordinator.    It can also take 4-6 weeks to see ENT. She is welcome to schedule to see me since I haven't seen her but reviewing the chart it does seem like ENT makes sense

## 2021-08-09 NOTE — Addendum Note (Signed)
Addended by: [name] on: 08/09/2021 09:56 AM   Modules accepted: Orders

## 2021-08-09 NOTE — Telephone Encounter (Signed)
Patient is calling in stating she has been seen multiple times for an ear infection. Was advised if not any better then a referral to an ENT could be placed. Frances Hoffman is asking for this referral as she isnt feeling much better.

## 2021-08-10 NOTE — Telephone Encounter (Signed)
Pt calling following up on referral. Patient states her ear pain is bad, and medicine the last doctor prescribed did not work (couldn't remember the name, one was drops and another pills she stated)  Read messages from Dr. Einar Pheasant below.  Patient agreed to come in tomorrow to be seen, placed on Tdugal's schedule at 12, as the only slot Einar Pheasant had was 9am and the patient stated that was too early

## 2021-08-11 ENCOUNTER — Ambulatory Visit (INDEPENDENT_AMBULATORY_CARE_PROVIDER_SITE_OTHER): Payer: Medicare HMO | Admitting: Family

## 2021-08-11 ENCOUNTER — Encounter: Payer: Self-pay | Admitting: Family

## 2021-08-11 VITALS — BP 92/60 | HR 71 | Temp 98.1°F | Resp 16 | Ht 64.0 in | Wt 180.4 lb

## 2021-08-11 DIAGNOSIS — H6591 Unspecified nonsuppurative otitis media, right ear: Secondary | ICD-10-CM | POA: Diagnosis not present

## 2021-08-11 DIAGNOSIS — H6981 Other specified disorders of Eustachian tube, right ear: Secondary | ICD-10-CM

## 2021-08-11 MED ORDER — CEFTRIAXONE SODIUM 1 G IJ SOLR
1.0000 g | Freq: Once | INTRAMUSCULAR | Status: AC
  Administered 2021-08-11: 1 g via INTRAMUSCULAR

## 2021-08-11 NOTE — Patient Instructions (Addendum)
Recommend night time zyrtec generic is fine (cetirizine)   Due to recent changes in healthcare laws, you may see results of your imaging and/or laboratory studies on MyChart before I have had a chance to review them.  I understand that in some cases there may be results that are confusing or concerning to you. Please understand that not all results are received at the same time and often I may need to interpret multiple results in order to provide you with the best plan of care or course of treatment. Therefore, I ask that you please give me 2 business days to thoroughly review all your results before contacting my office for clarification. Should we see a critical lab result, you will be contacted sooner.   It was a pleasure seeing you today! Please do not hesitate to reach out with any questions and or concerns.  Regards,   Eugenia Pancoast FNP-C

## 2021-08-11 NOTE — Progress Notes (Signed)
Established Patient Office Visit  Subjective:  Patient ID: Frances Hoffman, female    DOB: [birth year]  Age: 81 y.o. MRN: [id number]  CC:  Chief Complaint  Patient presents with  . Ear Pain    Right ear X 2 weeks no better    HPI Frances Hoffman is here today with concerns.   Still with right ear pain.  No sore throat.  No nasal congestion. Runny nose, but this Is often.  Taking flonase daily.   Was seen in office 5/30 and was given augmentin, completed course of treatment with no real relief no change in symptoms. She did also have rx for three days of 40 mg prednisone with no relief.   Past Medical History:  Diagnosis Date  . Adjustment disorder with mixed anxiety and depressed mood 12/12/2007   Qualifier: Diagnosis of  By: Copland MD, Karleen Hampshire    . Breast cancer (HCC)   . Cancer (HCC) 2000   breast had lumpectomy/radiation x36,mammo ,no chemo  . Cataract of both eyes   . Depression   . Dysuria-frequency syndrome    takes AZO  . Frozen shoulder   . Gall stones   . GERD 12/12/2007   Qualifier: Diagnosis of  By: Copland MD, Karleen Hampshire    . Hyperlipidemia   . Hypothyroidism 12/12/2007   Qualifier: Diagnosis of  By: Copland MD, Karleen Hampshire    . Memory difficulties 09/27/2017  . Memory loss   . Memory loss 09/22/2014  . Osteopenia    BMD 2004, WNL 2008  . Personal history of radiation therapy   . Pneumonia   . Recurrent UTI   . Shingles    chronic body pain, left side of body  . Shortness of breath dyspnea    when climbing stairs only  . Wears glasses     Past Surgical History:  Procedure Laterality Date  . BREAST LUMPECTOMY Left 2000  . CHOLECYSTECTOMY N/A 02/21/2015   Procedure: LAPAROSCOPIC CHOLECYSTECTOMY WITH INTRAOPERATIVE CHOLANGIOGRAM;  Surgeon: Gaynelle Adu, MD;  Location: [facility] OR;  Service: General;  Laterality: N/A;  . COLONOSCOPY    . MULTIPLE TOOTH EXTRACTIONS      Family History  Problem Relation Age of Onset  . Osteoporosis Mother   . Parkinsonism  Father   . Breast cancer Sister 47  . Breast cancer Other   . Coronary artery disease Sister   . Ovarian cancer Sister   . Heart disease Maternal Grandmother   . Heart disease Maternal Grandfather   . Heart attack Maternal Grandfather 50  . Heart disease Son   . Heart attack Son 89    Social History   Socioeconomic History  . Marital status: Married    Spouse name: [name]   . Number of children: 2  . Years of education: High school  . Highest education level: High school graduate  Occupational History  . Not on file  Tobacco Use  . Smoking status: Never  . Smokeless tobacco: Never  Vaping Use  . Vaping Use: Never used  Substance and Sexual Activity  . Alcohol use: No    Alcohol/week: 0.0 standard drinks of alcohol  . Drug use: No  . Sexual activity: Not Currently    Birth control/protection: Post-menopausal  Other Topics Concern  . Not on file  Social History Narrative   Lives at home with her husband. [name]   2 adult sons  - [place] and Minerva Areola   2 Grandchildren - both other   Exercise: goes  to silver sneakers - at least 2 times a week   Diet: anything that tastes good, less pepsi's with the tongue   Enjoys: square dance, eating out with friends, going to church - Sunday and Wednesday   Social Determinants of Health   Financial Resource Strain: Low Risk  (01/23/2021)   Overall Financial Resource Strain (CARDIA)   . Difficulty of Paying Living Expenses: Not hard at all  Food Insecurity: No Food Insecurity (01/23/2021)   Hunger Vital Sign   . Worried About Programme researcher, broadcasting/film/video in the Last Year: Never true   . Ran Out of Food in the Last Year: Never true  Transportation Needs: No Transportation Needs (01/23/2021)   PRAPARE - Transportation   . Lack of Transportation (Medical): No   . Lack of Transportation (Non-Medical): No  Physical Activity: Insufficiently Active (01/23/2021)   Exercise Vital Sign   . Days of Exercise per Week: 2 days   . Minutes of Exercise  per Session: 30 min  Stress: No Stress Concern Present (01/23/2021)   [employer] of Occupational Health - Occupational Stress Questionnaire   . Feeling of Stress : Not at all  Social Connections: Socially Integrated (01/23/2021)   Social Connection and Isolation Panel [NHANES]   . Frequency of Communication with Friends and Family: Three times a week   . Frequency of Social Gatherings with Friends and Family: Three times a week   . Attends Religious Services: More than 4 times per year   . Active Member of Clubs or Organizations: Yes   . Attends Banker Meetings: More than 4 times per year   . Marital Status: Married  Catering manager Violence: Not At Risk (01/23/2021)   Humiliation, Afraid, Rape, and Kick questionnaire   . Fear of Current or Ex-Partner: No   . Emotionally Abused: No   . Physically Abused: No   . Sexually Abused: No    Outpatient Medications Prior to Visit  Medication Sig Dispense Refill  . amoxicillin-clavulanate (AUGMENTIN) 875-125 MG tablet Take 1 tablet by mouth 2 (two) times daily. 14 tablet 0  . atorvastatin (LIPITOR) 40 MG tablet Take 1 tablet (40 mg total) by mouth daily. For cholesterol. 90 tablet 3  . b complex vitamins tablet Take 1 tablet by mouth daily.    . calcium carbonate (OS-CAL - DOSED IN MG OF ELEMENTAL CALCIUM) 1250 (500 CA) MG tablet Take 1 tablet by mouth daily.    . Calcium Citrate (CITRACAL PO) Take by mouth.    . carboxymethylcellulose (REFRESH PLUS) 0.5 % SOLN Place 1 drop into both eyes 3 (three) times daily as needed (for dryness).    . Cholecalciferol (VITAMIN D3) 2000 UNITS capsule Take 2,000 Units by mouth daily.    . Cranberry 125 MG TABS Take 2 tablets by mouth.    . cyanocobalamin ([employer]S VITAMIN B12) 2000 MCG tablet Take 1 tablet (2,000 mcg total) by mouth daily.    [name] donepezil (ARICEPT) 10 MG tablet Take 1 tablet (10 mg total) by mouth at bedtime. 90 tablet 3  . escitalopram (LEXAPRO) 20 MG tablet Take 1 tablet (20  mg total) by mouth daily. 90 tablet 3  . fluconazole (DIFLUCAN) 150 MG tablet Take 1 tablet (150 mg total) by mouth once a week. 2 tablet 1  . fluticasone (FLONASE) 50 MCG/ACT nasal spray 1 spray 2 times daily after sinus rinses- Neil med or AYR 16 g 6  . ibuprofen (ADVIL,MOTRIN) 600 MG tablet Take 1 tablet (600 mg  total) by mouth every 8 (eight) hours as needed (severer pain/ headache). 10 tablet 0  . levothyroxine (EUTHYROX) 75 MCG tablet Take 1 tablet (75 mcg total) by mouth daily. 90 tablet 3  . loperamide (IMODIUM) 1 MG/5ML solution Take by mouth as needed for diarrhea or loose stools. Teaspoons amounts    . memantine (NAMENDA) 10 MG tablet Take 1 tablet (10 mg total) by mouth 2 (two) times daily. 60 tablet 11  . Multiple Vitamin (MULTIVITAMIN) tablet Take 1 tablet by mouth daily.    . Omega-3 Fatty Acids (FISH OIL PO) Take 2 capsules by mouth 2 (two) times daily.    [name] omeprazole (PRILOSEC) 20 MG capsule Take by mouth daily.    . predniSONE (DELTASONE) 20 MG tablet Take 2 tablets (40 mg total) by mouth daily. 6 tablet 0  . vitamin C (ASCORBIC ACID) 500 MG tablet Take 500 mg by mouth daily.     No facility-administered medications prior to visit.    Allergies  Allergen Reactions  . Ciprofloxacin Other (See Comments)    Unknown (vertigo??)  . Citalopram Hydrobromide Other (See Comments)    Intrusive/ odd thoughts   . Flagyl [Metronidazole] Other (See Comments)    Unknown??  . Oxycodone Other (See Comments)    Headaches        Objective:    Physical Exam  BP 92/60   Pulse 71   Temp 98.1 F (36.7 C)   Resp 16   Ht 5\' 4"  (1.626 m)   Wt 180 lb 6 oz (81.8 kg)   SpO2 94%   BMI 30.96 kg/m  Wt Readings from Last 3 Encounters:  08/11/21 180 lb 6 oz (81.8 kg)  07/26/21 179 lb (81.2 kg)  07/06/21 178 lb 3 oz (80.8 kg)     Health Maintenance Due  Topic Date Due  . MAMMOGRAM  06/17/2021    There are no preventive care reminders to display for this patient.  Lab  Results  Component Value Date   TSH 1.89 08/23/2020   Lab Results  Component Value Date   WBC 9.7 04/18/2021   HGB 12.4 04/18/2021   HCT 37.2 04/18/2021   MCV 93.5 04/18/2021   PLT 181 04/18/2021   Lab Results  Component Value Date   NA 139 04/18/2021   K 3.6 04/18/2021   CO2 25 04/18/2021   GLUCOSE 120 (H) 04/18/2021   BUN 9 04/18/2021   CREATININE 1.01 (H) 04/18/2021   BILITOT 1.0 04/18/2021   ALKPHOS 80 04/18/2021   AST 36 04/18/2021   ALT 39 04/18/2021   PROT 5.6 (L) 04/18/2021   ALBUMIN 2.6 (L) 04/18/2021   CALCIUM 8.9 04/18/2021   ANIONGAP 8 04/18/2021   GFR 57.55 (L) 08/23/2020   Lab Results  Component Value Date   HGBA1C 5.9 02/16/2021      Assessment & Plan:   Problem List Items Addressed This Visit   None   No orders of the defined types were placed in this encounter.   Follow-up: No follow-ups on file.    [name], FNP

## 2021-08-14 ENCOUNTER — Encounter: Payer: Self-pay | Admitting: Family

## 2021-08-14 ENCOUNTER — Ambulatory Visit
Admission: RE | Admit: 2021-08-14 | Discharge: 2021-08-14 | Disposition: A | Discharge status: Home / Self Care | Payer: Medicare HMO | Source: Ambulatory Visit | Attending: Family Medicine | Admitting: Family Medicine

## 2021-08-14 DIAGNOSIS — H6591 Unspecified nonsuppurative otitis media, right ear: Secondary | ICD-10-CM | POA: Insufficient documentation

## 2021-08-14 DIAGNOSIS — Z1231 Encounter for screening mammogram for malignant neoplasm of breast: Secondary | ICD-10-CM | POA: Diagnosis not present

## 2021-08-14 NOTE — Assessment & Plan Note (Signed)
Rocephin 1 mg administered in office Pt tolerated procedure well  Verbal consent obtained prior to administration  Pt to f/u if sx worsen and or fail to improve in 2-3 days.  Referral placed to ent for ongoing recurrent aom with treatment failure

## 2021-09-08 ENCOUNTER — Ambulatory Visit
Admission: EM | Admit: 2021-09-08 | Discharge: 2021-09-08 | Discharge status: Against Medical Advice | Payer: Medicare HMO | Attending: Internal Medicine | Admitting: Internal Medicine

## 2021-09-13 ENCOUNTER — Ambulatory Visit: Payer: Medicare HMO | Admitting: Family

## 2021-09-15 ENCOUNTER — Telehealth: Payer: Self-pay

## 2021-09-15 NOTE — Telephone Encounter (Signed)
I saw pt back 6/9 for this, over one month ago. Was also given shot of rocephin in office.  At this point needs to f/u with Dr. Einar Pheasant.

## 2021-09-15 NOTE — Telephone Encounter (Signed)
Patient scheduled.

## 2021-09-15 NOTE — Telephone Encounter (Signed)
Patient called in stating she seen Tabitha for an ear problem, was advised to use nasal spray but it has not helped her and is unable to get in with an ENT until September. Wondering if there is something else she can use.

## 2021-09-26 ENCOUNTER — Ambulatory Visit (INDEPENDENT_AMBULATORY_CARE_PROVIDER_SITE_OTHER): Payer: Medicare HMO | Admitting: Family Medicine

## 2021-09-26 VITALS — BP 80/50 | HR 75 | Temp 97.5°F | Wt 179.5 lb

## 2021-09-26 DIAGNOSIS — H6981 Other specified disorders of Eustachian tube, right ear: Secondary | ICD-10-CM | POA: Diagnosis not present

## 2021-09-26 NOTE — Progress Notes (Signed)
   Subjective:     Frances Hoffman is a 81 y.o. female presenting for Follow-up (Ear. Not any better. Feels like water or pressure in it. ) and Skin Discoloration (L wrist )     HPI  #Ear pain - Right side - feels like there water or bubbles there - no taking any medications - Tried swimmers ear otc - no improvement, 2-3 weeks - 08/11/2021 - course of abx w/o improvement - Flonase nasal spray - used for at least 2 weeks w/o improvement - 07/26/2021 - steroid course   - nose running constantly - occasionally takes an allergy pill  Has not tried chewing gum Has not tried pseudoephedrine  Review of Systems   Social History   Tobacco Use  Smoking Status Never  Smokeless Tobacco Never        Objective:    BP Readings from Last 3 Encounters:  09/26/21 (!) 80/50  08/11/21 92/60  07/26/21 118/72   Wt Readings from Last 3 Encounters:  09/26/21 179 lb 8 oz (81.4 kg)  08/11/21 180 lb 6 oz (81.8 kg)  07/26/21 179 lb (81.2 kg)    BP (!) 80/50   Pulse 75   Temp (!) 97.5 F (36.4 C) (Temporal)   Wt 179 lb 8 oz (81.4 kg)   SpO2 95%   BMI 30.81 kg/m    Physical Exam Constitutional:      General: She is not in acute distress.    Appearance: She is well-developed. She is not diaphoretic.  HENT:     Right Ear: External ear normal. A middle ear effusion is present. Tympanic membrane is not erythematous, retracted or bulging.     Left Ear: Tympanic membrane, ear canal and external ear normal.     Ears:     Comments: Does have some wrinkling of the TM    Nose: Nose normal.  Eyes:     Conjunctiva/sclera: Conjunctivae normal.  Cardiovascular:     Rate and Rhythm: Normal rate and regular rhythm.  Pulmonary:     Effort: Pulmonary effort is normal. No respiratory distress.     Breath sounds: Normal breath sounds. No wheezing.  Musculoskeletal:     Cervical back: Neck supple.  Skin:    General: Skin is warm and dry.     Capillary Refill: Capillary refill takes less  than 2 seconds.  Neurological:     Mental Status: She is alert. Mental status is at baseline.  Psychiatric:        Mood and Affect: Mood normal.        Behavior: Behavior normal.           Assessment & Plan:   Problem List Items Addressed This Visit       Nervous and Auditory   Eustachian tube dysfunction, right - Primary    She completed a course of prednisone without improvement.  Last visit erythematous TM was given antibiotics not erythematous today suspect any infection has resolved.  Continues to have symptoms.  Discussed starting daily antihistamine, okay to try stopping Flonase and restart if symptoms worsen.  Also discussed trial of Sudafed.  She has ENT appointment coming up in September if symptoms do not resolve.        Return if symptoms worsen or fail to improve.  [name], MD

## 2021-09-26 NOTE — Patient Instructions (Addendum)
Ear pain - Start a daily allergy pill - claritin, zyrtec, allegra (store brand ok) - Take every day for at least 2 weeks - Ok to stop the flonase if worsening symptoms (runny nose, sneezing, ear pain) then restart - is you could try taking decongestant - sudafed   If fullness sensation - chew gum, yawning, nose and mouth closed and blow out   Skin lesion - dark bruise/purpura   Eustachian Tube Dysfunction  Eustachian tube dysfunction refers to a condition in which a blockage develops in the narrow passage that connects the middle ear to the back of the nose (eustachian tube). The eustachian tube regulates air pressure in the middle ear by letting air move between the ear and nose. It also helps to drain fluid from the middle ear space. Eustachian tube dysfunction can affect one or both ears. When the eustachian tube does not function properly, air pressure, fluid, or both can build up in the middle ear. What are the causes? This condition occurs when the eustachian tube becomes blocked or cannot open normally. Common causes of this condition include: Ear infections. Colds and other infections that affect the nose, mouth, and throat (upper respiratory tract). Allergies. Irritation from cigarette smoke. Irritation from stomach acid coming up into the esophagus (gastroesophageal reflux). The esophagus is the part of the body that moves food from the mouth to the stomach. Sudden changes in air pressure, such as from descending in an airplane or scuba diving. Abnormal growths in the nose or throat, such as: Growths that line the nose (nasal polyps). Abnormal growth of cells (tumors). Enlarged tissue at the back of the throat (adenoids). What increases the risk? You are more likely to develop this condition if: You smoke. You are overweight. You are a child who has: Certain birth defects of the mouth, such as cleft palate. Large tonsils or adenoids. What are the signs or symptoms? Common  symptoms of this condition include: A feeling of fullness in the ear. Ear pain. Clicking or popping noises in the ear. Ringing in the ear (tinnitus). Hearing loss. Loss of balance. Dizziness. Symptoms may get worse when the air pressure around you changes, such as when you travel to an area of high elevation, fly on an airplane, or go scuba diving. How is this diagnosed? This condition may be diagnosed based on: Your symptoms. A physical exam of your ears, nose, and throat. Tests, such as those that measure: The movement of your eardrum. Your hearing (audiometry). How is this treated? Treatment depends on the cause and severity of your condition. In mild cases, you may relieve your symptoms by moving air into your ears. This is called "popping the ears." In more severe cases, or if you have symptoms of fluid in your ears, treatment may include: Medicines to relieve congestion (decongestants). Medicines that treat allergies (antihistamines). Nasal sprays or ear drops that contain medicines that reduce swelling (steroids). A procedure to drain the fluid in your eardrum. In this procedure, a small tube may be placed in the eardrum to: Drain the fluid. Restore the air in the middle ear space. A procedure to insert a balloon device through the nose to inflate the opening of the eustachian tube (balloon dilation). Follow these instructions at home: Lifestyle Do not do any of the following until your health care provider approves: Travel to high altitudes. Fly in airplanes. Work in a Pension scheme manager or room. Scuba dive. Do not use any products that contain nicotine or tobacco. These products  include cigarettes, chewing tobacco, and vaping devices, such as e-cigarettes. If you need help quitting, ask your health care provider. Keep your ears dry. Wear fitted earplugs during showering and bathing. Dry your ears completely after. General instructions Take over-the-counter and  prescription medicines only as told by your health care provider. Use techniques to help pop your ears as recommended by your health care provider. These may include: Chewing gum. Yawning. Frequent, forceful swallowing. Closing your mouth, holding your nose closed, and gently blowing as if you are trying to blow air out of your nose. Keep all follow-up visits. This is important. Contact a health care provider if: Your symptoms do not go away after treatment. Your symptoms come back after treatment. You are unable to pop your ears. You have: A fever. Pain in your ear. Pain in your head or neck. Fluid draining from your ear. Your hearing suddenly changes. You become very dizzy. You lose your balance. Get help right away if: You have a sudden, severe increase in any of your symptoms. Summary Eustachian tube dysfunction refers to a condition in which a blockage develops in the eustachian tube. It can be caused by ear infections, allergies, inhaled irritants, or abnormal growths in the nose or throat. Symptoms may include ear pain or fullness, hearing loss, or ringing in the ears. Mild cases are treated with techniques to unblock the ears, such as yawning or chewing gum. More severe cases are treated with medicines or procedures. This information is not intended to replace advice given to you by your health care provider. Make sure you discuss any questions you have with your health care provider. Document Revised: 05/02/2020 Document Reviewed: 05/02/2020 Elsevier Patient Education  [place].

## 2021-09-26 NOTE — Assessment & Plan Note (Signed)
She completed a course of prednisone without improvement.  Last visit erythematous TM was given antibiotics not erythematous today suspect any infection has resolved.  Continues to have symptoms.  Discussed starting daily antihistamine, okay to try stopping Flonase and restart if symptoms worsen.  Also discussed trial of Sudafed.  She has ENT appointment coming up in September if symptoms do not resolve.

## 2021-10-11 ENCOUNTER — Ambulatory Visit (INDEPENDENT_AMBULATORY_CARE_PROVIDER_SITE_OTHER): Payer: Medicare HMO | Admitting: Family Medicine

## 2021-10-11 ENCOUNTER — Telehealth: Payer: Self-pay | Admitting: Family Medicine

## 2021-10-11 ENCOUNTER — Encounter: Payer: Self-pay | Admitting: Family Medicine

## 2021-10-11 VITALS — BP 104/62 | HR 84 | Temp 97.4°F | Ht 64.0 in | Wt 179.6 lb

## 2021-10-11 DIAGNOSIS — E538 Deficiency of other specified B group vitamins: Secondary | ICD-10-CM

## 2021-10-11 DIAGNOSIS — R5383 Other fatigue: Secondary | ICD-10-CM | POA: Diagnosis not present

## 2021-10-11 LAB — CBC WITH DIFFERENTIAL/PLATELET
Basophils Absolute: 0.1 10*3/uL (ref 0.0–0.1)
Basophils Relative: 0.8 % (ref 0.0–3.0)
Eosinophils Absolute: 0.1 10*3/uL (ref 0.0–0.7)
Eosinophils Relative: 1.1 % (ref 0.0–5.0)
HCT: 42.8 % (ref 36.0–46.0)
Hemoglobin: 14.2 g/dL (ref 12.0–15.0)
Lymphocytes Relative: 28.7 % (ref 12.0–46.0)
Lymphs Abs: 2 10*3/uL (ref 0.7–4.0)
MCHC: 33.2 g/dL (ref 30.0–36.0)
MCV: 94 fl (ref 78.0–100.0)
Monocytes Absolute: 0.6 10*3/uL (ref 0.1–1.0)
Monocytes Relative: 8.4 % (ref 3.0–12.0)
Neutro Abs: 4.4 10*3/uL (ref 1.4–7.7)
Neutrophils Relative %: 61 % (ref 43.0–77.0)
Platelets: 251 10*3/uL (ref 150.0–400.0)
RBC: 4.55 Mil/uL (ref 3.87–5.11)
RDW: 14.1 % (ref 11.5–15.5)
WBC: 7.1 10*3/uL (ref 4.0–10.5)

## 2021-10-11 LAB — COMPREHENSIVE METABOLIC PANEL
ALT: 14 U/L (ref 0–35)
AST: 20 U/L (ref 0–37)
Albumin: 4.1 g/dL (ref 3.5–5.2)
Alkaline Phosphatase: 97 U/L (ref 39–117)
BUN: 13 mg/dL (ref 6–23)
CO2: 26 mEq/L (ref 19–32)
Calcium: 9.6 mg/dL (ref 8.4–10.5)
Chloride: 103 mEq/L (ref 96–112)
Creatinine, Ser: 1.1 mg/dL (ref 0.40–1.20)
GFR: 47.28 mL/min — ABNORMAL LOW (ref >60.00)
Glucose, Bld: 123 mg/dL — ABNORMAL HIGH (ref 70–99)
Potassium: 3.7 mEq/L (ref 3.5–5.1)
Sodium: 140 mEq/L (ref 135–145)
Total Bilirubin: 0.7 mg/dL (ref 0.2–1.2)
Total Protein: 7.1 g/dL (ref 6.0–8.3)

## 2021-10-11 LAB — TSH: TSH: 1.36 u[IU]/mL (ref 0.35–5.50)

## 2021-10-11 LAB — VITAMIN B12: Vitamin B-12: 797 pg/mL (ref 211–911)

## 2021-10-11 NOTE — Telephone Encounter (Signed)
PLEASE NOTE: All timestamps contained within this report are represented as [place] Standard Time. CONFIDENTIALTY NOTICE: This fax transmission is intended only for the addressee. It contains information that is legally privileged, confidential or otherwise protected from use or disclosure. If you are not the intended recipient, you are strictly prohibited from reviewing, disclosing, copying using or disseminating any of this information or taking any action in reliance on or regarding this information. If you have received this fax in error, please notify us immediately by telephone so that we can arrange for its return to [place]. Phone: [phone], Toll-Free: [phone], Fax: [phone] Page: 1 of 3 Call Id: [id number] [facility] Day - Client TELEPHONE ADVICE RECORD AccessNurse Patient Name: Frances Hoffman Gender: Female DOB: 1940/05/18 Age: 81 Y 81 D Return Phone Number: [phone] (Primary), [phone] (Secondary) Address: [address] [place]/ State/ Zip: [place]  [address] Client [facility] Day - Client Client Site [facility] - Day Provider Gweneth Dimitri- MD Contact Type Call Who Is Calling Patient / Member / Family / Caregiver Call Type Triage / Clinical Relationship To Patient Self Return Phone Number [phone] (Primary) Chief Complaint BREATHING - shortness of breath or sounds breathless Reason for Call Symptomatic / Request for Health Information Initial Comment [name] states that she has SOB, tired, weakness. Translation No Nurse Assessment Nurse: Charna Elizabeth, RN, Cathy Date/Time (Eastern Time): 10/11/2021 11:01:58 AM Confirm and document reason for call. If symptomatic, describe symptoms. ---[name] states she developed increased weakness and shortness of breath about a month ago. No severe breathing difficulty or blueness around her lips. No chest pain. No fever. Alert and  responsive. Does the patient have any new or worsening symptoms? ---Yes Will a triage be completed? ---Yes Related visit to physician within the last 2 weeks? ---No Does the PT have any chronic conditions? (i.e. diabetes, asthma, this includes High risk factors for pregnancy, etc.) ---Yes List chronic conditions. ---Chronic diarrhea Is this a behavioral health or substance abuse call? ---No Guidelines Guideline Title Affirmed Question Affirmed Notes Nurse Date/Time (Eastern Time) Breathing Difficulty [1] MILD difficulty breathing (e.g., minimal/no SOB at rest, SOB with walking, pulse <100) AND [2] NEW-onset or WORSE than normal Trumbull, RN, Lynden Ang 10/11/2021 11:03:41 AM PLEASE NOTE: All timestamps contained within this report are represented as [place] Standard Time. CONFIDENTIALTY NOTICE: This fax transmission is intended only for the addressee. It contains information that is legally privileged, confidential or otherwise protected from use or disclosure. If you are not the intended recipient, you are strictly prohibited from reviewing, disclosing, copying using or disseminating any of this information or taking any action in reliance on or regarding this information. If you have received this fax in error, please notify us immediately by telephone so that we can arrange for its return to [place]. Phone: [phone], Toll-Free: [phone], Fax: [phone] Page: 2 of 3 Call Id: [id number] Guidelines Guideline Title Affirmed Question Affirmed Notes Nurse Date/Time Lamount Cohen Time) Weakness (Generalized) and Fatigue [1] MODERATE weakness (i.e., interferes with work, school, normal activities) AND [2] cause unknown (Exceptions: Weakness from acute minor illness or poor fluid intake; weakness is chronic and not worse.) Charna Elizabeth, RN, Cathy 10/11/2021 11:06:21 AM Disp. Time Lamount Cohen Time) Disposition Final User 10/11/2021 11:00:08 AM Send to Urgent Queue Randall Hiss 10/11/2021 11:06:05 AM See HCP within 4 Hours (or PCP triage) Charna Elizabeth, RN, Cathy 10/11/2021 11:07:16 AM See HCP within 4 Hours (or PCP triage) Yes Charna Elizabeth, RN, Cathy Final Disposition 10/11/2021 11:07:16  AM See HCP within 4 Hours (or PCP triage) Yes Trumbull, RN, Frann Rider Disagree/Comply Comply [name] Understands Yes PreDisposition Did not know what to do Care Advice Given Per Guideline SEE HCP (OR PCP TRIAGE) WITHIN 4 HOURS: * IF OFFICE WILL BE OPEN: You need to be seen within the next 3 or 4 hours. Call your doctor (or NP/PA) now or as soon as the office opens. CALL BACK IF: * You become worse CARE ADVICE given per Breathing Difficulty (Adult) guideline. SEE HCP (OR PCP TRIAGE) WITHIN 4 HOURS: BRING MEDICINES: * Please bring a list of your current medicines when you go to see the doctor. * It is also a good idea to bring the pill bottles too. This will help the doctor to make certain you are taking the right medicines and the right dose. CALL BACK IF: * You become worse CARE ADVICE given per Weakness and Fatigue (Adult) guideline. PLEASE NOTE: All timestamps contained within this report are represented as [place] Standard Time. CONFIDENTIALTY NOTICE: This fax transmission is intended only for the addressee. It contains information that is legally privileged, confidential or otherwise protected from use or disclosure. If you are not the intended recipient, you are strictly prohibited from reviewing, disclosing, copying using or disseminating any of this information or taking any action in reliance on or regarding this information. If you have received this fax in error, please notify us immediately by telephone so that we can arrange for its return to [place]. Phone: [phone], Toll-Free: [phone], Fax: [phone] Page: 3 of 3 Call Id: [id number] Referrals REFERRED TO PCP OFFICE

## 2021-10-11 NOTE — Telephone Encounter (Signed)
Pt called in stating she is sob and is very tired. I transferred her over to nurse triage.

## 2021-10-11 NOTE — Progress Notes (Signed)
[facility] [address] [place] [address] Dept: [phone] Dept Fax: [phone]  Office Visit  Subjective:    Patient ID: Frances Hoffman, female    DOB: November 07, 1940, 81 y.o..   MRN: [id number]  Chief Complaint  Patient presents with   Acute Visit    C/o having fatigue, weakness, SOB x 1 month.        History of Present Illness:  Patient is in today complaining of a 51-month history of fatigue and weakness. She is unsure if she feels some mild dyspnea or if this is just nervousness. She has not had other signs of illness. She denies any current URI symptoms. She has not had an melena or hematochezia. She has a history of hypothyroidism and is currently managed on levothyroxine 75 mcg daily. She has a history of depression managed on Lexapro 20 mg daily. She feels her mood has been good. She has a history of asymptomatic hypotension and this has not been different. She had COVID in mid-April. She can't clearly say if some of these symptoms developed around that time or not. She had wondered if this was just a fact of her getting older.  Past Medical History: Patient Active Problem List   Diagnosis Date Noted   Right otitis media with effusion 08/14/2021   Eustachian tube dysfunction, right 07/26/2021   Seasonal allergic rhinitis due to pollen 07/06/2021   Post-nasal drainage 07/06/2021   Cold extremities 04/19/2021   ETD (eustachian tube dysfunction) 01/24/2021   Nail deformity 08/23/2020   Stage 3a chronic kidney disease (HCC) 03/14/2020   Irritable bowel syndrome with diarrhea 03/17/2019   B12 deficiency 02/04/2018   Mood disorder (HCC) 02/04/2018   Xerostomia 01/23/2018   Dysgeusia 01/23/2018   Tongue burning sensation 01/16/2018   Chronic kidney disease (CKD) stage G3a/A1, moderately decreased glomerular filtration rate (GFR) between 45-59 mL/min/1.73 square meter and albuminuria creatinine ratio less than 30 mg/g  (HCC) 11/28/2017   Hearing loss and HA due to cerumen impaction, bilateral 10/10/2017   Vascular dementia without behavioral disturbance (HCC) 09/27/2017   Prediabetes 09/27/2017   Mild cognitive impairment 07/02/2017   Cerebral vascular disease 07/02/2017   Chronic insomnia 07/02/2017   Primary osteoarthritis of first carpometacarpal joint of left hand 01/09/2017   History of breast cancer 11/14/2016   Obesity, Class I, BMI 30-34.9 08/19/2016   Leg cramping 02/10/2016   Depression 12/23/2015   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   Asx Hypotension 09/25/2015   Fecal incontinence 07/29/2015   Heme positive stool 07/29/2015   Leg pain 06/06/2015   Overweight 03/15/2015   Exercise intolerance 07/30/2014   Estrogen deficiency 04/28/2014   Tachycardia 04/03/2014   Positional vertigo 08/18/2013   Insomnia- due to stress 01/16/2011   Fatigue 08/15/2009   Osteopenia 11/26/2008   Recurrent UTI 01/15/2008   Hypothyroidism 12/12/2007   Hyperlipidemia 12/12/2007   Adjustment disorder with mixed anxiety and depressed mood 12/12/2007   Chronic GERD 12/12/2007   ADHESIVE CAPSULITIS 12/12/2007   Past Surgical History:  Procedure Laterality Date   BREAST LUMPECTOMY Left 2000   CHOLECYSTECTOMY N/A 02/21/2015   Procedure: LAPAROSCOPIC CHOLECYSTECTOMY WITH INTRAOPERATIVE CHOLANGIOGRAM;  Surgeon: Gaynelle Adu, MD;  Location: [facility] OR;  Service: General;  Laterality: N/A;   COLONOSCOPY     MULTIPLE TOOTH EXTRACTIONS     Family History  Problem Relation Age of Onset   Osteoporosis Mother    Parkinsonism Father    Breast cancer Sister 15   Breast  cancer Other    Coronary artery disease Sister    Ovarian cancer Sister    Heart disease Maternal Grandmother    Heart disease Maternal Grandfather    Heart attack Maternal Grandfather 50   Heart disease Son    Heart attack Son 24   Outpatient Medications Prior to Visit  Medication Sig Dispense Refill   atorvastatin (LIPITOR) 40 MG tablet  Take 1 tablet (40 mg total) by mouth daily. For cholesterol. 90 tablet 3   b complex vitamins tablet Take 1 tablet by mouth daily.     calcium carbonate (OS-CAL - DOSED IN MG OF ELEMENTAL CALCIUM) 1250 (500 CA) MG tablet Take 1 tablet by mouth daily.     Calcium Citrate (CITRACAL PO) Take by mouth.     carboxymethylcellulose (REFRESH PLUS) 0.5 % SOLN Place 1 drop into both eyes 3 (three) times daily as needed (for dryness).     Cholecalciferol (VITAMIN D3) 2000 UNITS capsule Take 2,000 Units by mouth daily.     Cranberry 125 MG TABS Take 2 tablets by mouth.     cyanocobalamin ([employer] VITAMIN B12) 2000 MCG tablet Take 1 tablet (2,000 mcg total) by mouth daily.     donepezil (ARICEPT) 10 MG tablet Take 1 tablet (10 mg total) by mouth at bedtime. 90 tablet 3   escitalopram (LEXAPRO) 20 MG tablet Take 1 tablet (20 mg total) by mouth daily. 90 tablet 3   fluconazole (DIFLUCAN) 150 MG tablet Take 1 tablet (150 mg total) by mouth once a week. 2 tablet 1   fluticasone (FLONASE) 50 MCG/ACT nasal spray 1 spray 2 times daily after sinus rinses- Neil med or AYR 16 g 6   ibuprofen (ADVIL,MOTRIN) 600 MG tablet Take 1 tablet (600 mg total) by mouth every 8 (eight) hours as needed (severer pain/ headache). 10 tablet 0   levothyroxine (EUTHYROX) 75 MCG tablet Take 1 tablet (75 mcg total) by mouth daily. 90 tablet 3   loperamide (IMODIUM) 1 MG/5ML solution Take by mouth as needed for diarrhea or loose stools. Teaspoons amounts     memantine (NAMENDA) 10 MG tablet Take 1 tablet (10 mg total) by mouth 2 (two) times daily. 60 tablet 11   Multiple Vitamin (MULTIVITAMIN) tablet Take 1 tablet by mouth daily.     Omega-3 Fatty Acids (FISH OIL PO) Take 2 capsules by mouth 2 (two) times daily.     omeprazole (PRILOSEC) 20 MG capsule Take by mouth daily.     predniSONE (DELTASONE) 20 MG tablet Take 2 tablets (40 mg total) by mouth daily. 6 tablet 0   vitamin C (ASCORBIC ACID) 500 MG tablet Take 500 mg by mouth daily.     No  facility-administered medications prior to visit.   Allergies  Allergen Reactions   Ciprofloxacin Other (See Comments)    Unknown (vertigo??)   Citalopram Hydrobromide Other (See Comments)    Intrusive/ odd thoughts    Flagyl [Metronidazole] Other (See Comments)    Unknown??   Oxycodone Other (See Comments)    Headaches     Objective:   Today's Vitals   10/11/21 1348  BP: 104/62  Pulse: 84  Temp: (!) 97.4 F (36.3 C)  TempSrc: Temporal  SpO2: 94%  Weight: 179 lb 9.6 oz (81.5 kg)  Height: 5\' 4"  (1.626 m)   Body mass index is 30.83 kg/m.   General: Well developed, well nourished. No acute distress. Neck: Supple. No lymphadenopathy. No thyromegaly. Lungs: Clear to auscultation bilaterally. No wheezing, rales or  rhonchi. CV: RRR without murmurs or rubs. Pulses 2+ bilaterally. Psych: Alert and oriented. Normal mood and affect.  Health Maintenance Due  Topic Date Due   INFLUENZA VACCINE  10/03/2021     Assessment & Plan:   1. Fatigue, unspecified type In light of history of GERD managed on Prilosec and previous heme+ stools, I will check a CBC to assess for anemia. As she has hypothyroidism, I will reassess her TSH to check the adequacy of her levothyroxine dose.I will also screen for liver or kidney disease. We did discuss that fatigue and dyspnea can be common long-COVID symptoms. If so, these tend to resolve gradually with time. I do not see any physical signs to suggest a clear underlying cardiovascular or pulmonary issue.  - CBC with Differential/Platelet - TSH - Comprehensive metabolic panel  2. B12 deficiency Ms. Trachtman has a history of a Vit. B12  deficiency. She does take an oral supplement for this. I will reassess her current level.  - Vitamin B12   Return in about 1 week (around 10/18/2021) for follow-up with PCP.   Loyola Mast, MD

## 2021-10-11 NOTE — Telephone Encounter (Signed)
Patient scheduled to see Dr. Gena Fray today 10/11/21.

## 2021-10-12 ENCOUNTER — Telehealth: Payer: Self-pay

## 2021-10-12 NOTE — Telephone Encounter (Signed)
Appreciate Dr. Juliane Lack support

## 2021-10-12 NOTE — Telephone Encounter (Signed)
Sure - ok to schedule appt with me to establish care as I see her husband.  Thank you.

## 2021-10-12 NOTE — Telephone Encounter (Signed)
Patient is aware her PCP is leaving and wanted to know if Dr.G would consider taking her on as a patient as her husband is a current patient and his name is Armanie Ullmer (01/02/42).

## 2021-10-17 ENCOUNTER — Ambulatory Visit (INDEPENDENT_AMBULATORY_CARE_PROVIDER_SITE_OTHER): Payer: Medicare HMO | Admitting: Family Medicine

## 2021-10-17 ENCOUNTER — Encounter: Payer: Self-pay | Admitting: Family Medicine

## 2021-10-17 VITALS — BP 98/68 | HR 79 | Temp 98.3°F | Resp 16 | Ht 64.0 in | Wt 180.1 lb

## 2021-10-17 DIAGNOSIS — T3695XA Adverse effect of unspecified systemic antibiotic, initial encounter: Secondary | ICD-10-CM

## 2021-10-17 DIAGNOSIS — B379 Candidiasis, unspecified: Secondary | ICD-10-CM | POA: Diagnosis not present

## 2021-10-17 DIAGNOSIS — I959 Hypotension, unspecified: Secondary | ICD-10-CM

## 2021-10-17 DIAGNOSIS — M6281 Muscle weakness (generalized): Secondary | ICD-10-CM

## 2021-10-17 DIAGNOSIS — H02403 Unspecified ptosis of bilateral eyelids: Secondary | ICD-10-CM | POA: Diagnosis not present

## 2021-10-17 DIAGNOSIS — N1831 Chronic kidney disease, stage 3a: Secondary | ICD-10-CM

## 2021-10-17 DIAGNOSIS — H6691 Otitis media, unspecified, right ear: Secondary | ICD-10-CM

## 2021-10-17 LAB — CK: Total CK: 52 U/L (ref 7–177)

## 2021-10-17 MED ORDER — CEFDINIR 300 MG PO CAPS
600.0000 mg | ORAL_CAPSULE | Freq: Every day | ORAL | 0 refills | Status: AC
Start: 2021-10-17 — End: 2021-10-24

## 2021-10-17 MED ORDER — FLUCONAZOLE 150 MG PO TABS: 150.0000 mg | ORAL_TABLET | Freq: Once | ORAL | 0 refills | Status: AC

## 2021-10-17 NOTE — Assessment & Plan Note (Signed)
She did have a slightly worsening kidney function and is fatigued.  Question possibility of low blood pressure contributing to her fatigue, though this has been chronic for years.  She denies any lightheadedness or dizziness.  Work-up is negative will consider referral to cardiology for low blood pressure evaluation and consideration for treatment.

## 2021-10-17 NOTE — Patient Instructions (Signed)
Labs today  Consider Cardiology consult - update if you want to see someone  Antibiotic for ear  Drink 48-64 oz of water daily Can have some salt in diet

## 2021-10-17 NOTE — Progress Notes (Signed)
Subjective:     Frances Hoffman is a 81 y.o. female presenting for No chief complaint on file.     HPI  #Eustachian tube dysfunction - still dealing with this   #CKD - tries to drink water - low energy  - can ride a bicycle for 5 minutes but deals with muscle fatigue - tiredness is worse in the legs - no legs or chest - no sob, cp  Review of Systems  10/11/2021: Clinic - Fatigue - no clear cause - CBC, B12, thyroid normal kidney function worse  Social History   Tobacco Use  Smoking Status Never  Smokeless Tobacco Never        Objective:    BP Readings from Last 3 Encounters:  10/17/21 98/68  10/11/21 104/62  09/26/21 (!) 80/50   Wt Readings from Last 3 Encounters:  10/17/21 180 lb 2 oz (81.7 kg)  10/11/21 179 lb 9.6 oz (81.5 kg)  09/26/21 179 lb 8 oz (81.4 kg)    BP 98/68   Pulse 79   Temp 98.3 F (36.8 C)   Resp 16   Ht '5\' 4"'$  (1.626 m)   Wt 180 lb 2 oz (81.7 kg)   SpO2 94%   BMI 30.92 kg/m    Physical Exam Constitutional:      General: She is not in acute distress.    Appearance: She is well-developed. She is not diaphoretic.  HENT:     Head:     Comments: Mild ptosis    Right Ear: External ear normal. A middle ear effusion is present. Tympanic membrane is not erythematous.     Left Ear: Hearing and external ear normal. There is impacted cerumen.     Nose: Nose normal.  Eyes:     Conjunctiva/sclera: Conjunctivae normal.  Cardiovascular:     Rate and Rhythm: Normal rate and regular rhythm.     Heart sounds: No murmur heard. Pulmonary:     Effort: Pulmonary effort is normal. No respiratory distress.     Breath sounds: Normal breath sounds. No wheezing.  Musculoskeletal:     Cervical back: Neck supple.     Comments: LE strength normal  Skin:    General: Skin is warm and dry.     Capillary Refill: Capillary refill takes less than 2 seconds.  Neurological:     Mental Status: She is alert. Mental status is at baseline.  Psychiatric:         Mood and Affect: Mood normal.        Behavior: Behavior normal.           Assessment & Plan:   Problem List Items Addressed This Visit       Cardiovascular and Mediastinum   Asx Hypotension (Chronic)    She did have a slightly worsening kidney function and is fatigued.  Question possibility of low blood pressure contributing to her fatigue, though this has been chronic for years.  She denies any lightheadedness or dizziness.  Work-up is negative will consider referral to cardiology for low blood pressure evaluation and consideration for treatment.        Genitourinary   Chronic kidney disease (CKD) stage G3a/A1, moderately decreased glomerular filtration rate (GFR) between 45-59 mL/min/1.73 square meter and albuminuria creatinine ratio less than 30 mg/g (HCC)    Encouraged hydration, slightly worse on most recent labs.  Blood pressure is low which could be contributing to this may consider cardiology referral.  Other   Muscle weakness - Primary    Discussed weakness and exercise fatigue after just 5 minutes on a stationary bike.  She does feel like her eyes are droopy, discussed evaluation for myasthenia gravis.  We will also check CK and ANA due to muscle weakness and fatigue.  Work-up so far has been negative.      Relevant Orders   AChR Abs with Reflex to MuSK   CK   ANA w/Reflex   Other Visit Diagnoses     Ptosis of both eyelids       Relevant Orders   AChR Abs with Reflex to MuSK   CK   ANA w/Reflex   Right otitis media, unspecified otitis media type       Relevant Medications   cefdinir (OMNICEF) 300 MG capsule   fluconazole (DIFLUCAN) 150 MG tablet   Antibiotic-induced yeast infection       Relevant Medications   cefdinir (OMNICEF) 300 MG capsule   fluconazole (DIFLUCAN) 150 MG tablet       Ear pain persists, some fluid behind the ear today question possible persistent infection.  Treat with cefdinir.  She did receive Augmentin, prednisone,  Flonase, and Sudafed without resolution of symptoms.  Scheduled with ENT in September.   Return if symptoms worsen or fail to improve.  [name], MD

## 2021-10-17 NOTE — Assessment & Plan Note (Signed)
Encouraged hydration, slightly worse on most recent labs.  Blood pressure is low which could be contributing to this may consider cardiology referral.

## 2021-10-17 NOTE — Assessment & Plan Note (Signed)
Discussed weakness and exercise fatigue after just 5 minutes on a stationary bike.  She does feel like her eyes are droopy, discussed evaluation for myasthenia gravis.  We will also check CK and ANA due to muscle weakness and fatigue.  Work-up so far has been negative.

## 2021-10-19 DIAGNOSIS — N390 Urinary tract infection, site not specified: Secondary | ICD-10-CM | POA: Diagnosis not present

## 2021-10-24 ENCOUNTER — Other Ambulatory Visit: Payer: Self-pay | Admitting: Family Medicine

## 2021-10-24 DIAGNOSIS — F0150 Vascular dementia without behavioral disturbance: Secondary | ICD-10-CM

## 2021-10-27 DIAGNOSIS — H524 Presbyopia: Secondary | ICD-10-CM | POA: Diagnosis not present

## 2021-10-27 DIAGNOSIS — H04123 Dry eye syndrome of bilateral lacrimal glands: Secondary | ICD-10-CM | POA: Diagnosis not present

## 2021-10-27 DIAGNOSIS — H18593 Other hereditary corneal dystrophies, bilateral: Secondary | ICD-10-CM | POA: Diagnosis not present

## 2021-10-27 DIAGNOSIS — H25813 Combined forms of age-related cataract, bilateral: Secondary | ICD-10-CM | POA: Diagnosis not present

## 2021-10-30 LAB — MUSK ANTIBODIES: MuSK Antibodies: <1 U/mL

## 2021-10-30 LAB — ANA W/REFLEX: Anti Nuclear Antibody (ANA): NEGATIVE

## 2021-10-30 LAB — ACHR ABS WITH REFLEX TO MUSK: AChR Binding Ab, Serum: 0.05 nmol/L (ref 0.00–0.24)

## 2021-11-08 DIAGNOSIS — H938X2 Other specified disorders of left ear: Secondary | ICD-10-CM | POA: Diagnosis not present

## 2021-11-08 DIAGNOSIS — H9201 Otalgia, right ear: Secondary | ICD-10-CM | POA: Diagnosis not present

## 2021-11-08 DIAGNOSIS — H903 Sensorineural hearing loss, bilateral: Secondary | ICD-10-CM | POA: Diagnosis not present

## 2021-11-14 ENCOUNTER — Ambulatory Visit: Payer: Medicare HMO | Admitting: Family Medicine

## 2021-11-28 ENCOUNTER — Other Ambulatory Visit: Payer: Self-pay | Admitting: Neurology

## 2021-11-29 ENCOUNTER — Other Ambulatory Visit: Payer: Self-pay | Admitting: Neurology

## 2021-12-12 ENCOUNTER — Telehealth: Payer: Self-pay | Admitting: Neurology

## 2021-12-12 NOTE — Telephone Encounter (Signed)
Pt is calling and requesting a refill on medication  memantine (NAMENDA) 10 MG tablet. Refill should be sent to [place].

## 2021-12-13 MED ORDER — MEMANTINE HCL 10 MG PO TABS: 10.0000 mg | ORAL_TABLET | Freq: Two times a day (BID) | ORAL | 0 refills | Status: DC

## 2021-12-13 NOTE — Telephone Encounter (Signed)
Rx refilled to avoid disruption of care.

## 2022-01-04 ENCOUNTER — Ambulatory Visit: Payer: Medicare HMO | Admitting: Neurology

## 2022-01-04 ENCOUNTER — Encounter: Payer: Self-pay | Admitting: Neurology

## 2022-01-04 VITALS — Ht 64.0 in | Wt 181.0 lb

## 2022-01-04 DIAGNOSIS — F0150 Vascular dementia without behavioral disturbance: Secondary | ICD-10-CM

## 2022-01-04 DIAGNOSIS — G3184 Mild cognitive impairment, so stated: Secondary | ICD-10-CM

## 2022-01-04 DIAGNOSIS — R69 Illness, unspecified: Secondary | ICD-10-CM | POA: Diagnosis not present

## 2022-01-04 MED ORDER — DONEPEZIL HCL 10 MG PO TABS: 10.0000 mg | ORAL_TABLET | Freq: Every day | ORAL | 3 refills | Status: DC

## 2022-01-04 MED ORDER — MEMANTINE HCL 10 MG PO TABS: 10.0000 mg | ORAL_TABLET | Freq: Two times a day (BID) | ORAL | 3 refills | Status: DC

## 2022-01-04 NOTE — Progress Notes (Signed)
Chief Complaint  Patient presents with   Follow-up    Rm 14. Accompanied by husband. Pts memory is stable.      ASSESSMENT AND PLAN  Frances Hoffman is a 81 y.o. female   Mild cognitive impairment  MRI of the brain in 2019 showed generalized atrophy moderate supratentorium small vessel disease  Laboratory evaluation showed no treatable etiology  Family history of dementia, Parkinson's disease,  Most suggestive of early stage of central nervous system degenerative disorder,  MoCA examination 23/30  Refilled Aricept 10 mg daily Namenda 10 mg twice a day  Encouraged her moderate exercise  Continue follow-up with primary care physician  DIAGNOSTIC DATA (LABS, IMAGING, TESTING) - I reviewed patient records, labs, notes, testing and imaging myself where available.   MEDICAL HISTORY:  Frances Hoffman is a 81 year old female, seen in request by her primary care physician Dr. Einar Pheasant, [facility] Marker, for evaluation of memory loss, she is accompanied by her husband at today's visit November 24, 2020  I reviewed and summarized the referring note.  Past medical history Breast cancer in 2000, left lobectomy, radiation Depression GERD Hyperlipidemia Hypothyroidism  I saw her more than 3 years ago, for complaints of memory loss  Family history of memory loss, father had Parkinson's disease, memory loss, sister also suffered dementia  Since 2018 she was noted to have mild memory loss, gradually getting worse, especially short-term memory, MoCA examination 25/30, previously complains of mood swings, now stabilized with Lexapro 20 mg daily, she is still driving, but got lost couple times,  I personally reviewed MRI of the brain in February 2019, mild atrophy, moderate supratentorium small vessel disease  Laboratory evaluation showed normal TSH B12, CMP, CBC, She has been taking Aricept, 10 mg daily, tolerating it well,  UPDATE Jan 04 2022: She is accompanied by her husband at today's  clinical visit, overall doing well, likes to read, but is getting difficult for her to follow the story line, more time watching TV, like to do word puzzles  She sleeps okay, but woke up 1-2 times every night to use bathroom  PHYSICAL EXAM:   Vitals:   01/04/22 1337  Weight: 181 lb (82.1 kg)  Height: '5\' 4"'$  (1.626 m)  BP 120/80,  Body mass index is 31.07 kg/m.  PHYSICAL EXAMNIATION:  Gen: NAD, conversant, well nourised, well groomed                     Cardiovascular: Regular rate rhythm, no peripheral edema, warm, nontender. Eyes: Conjunctivae clear without exudates or hemorrhage Neck: Supple, no carotid bruits. Pulmonary: Clear to auscultation bilaterally   NEUROLOGICAL EXAM:  MENTAL STATUS: Speech:    Speech is normal; fluent and spontaneous with normal comprehension.  Cognition:       01/04/2022    1:40 PM 11/24/2020   10:55 AM 08/23/2020    3:21 PM  Montreal Cognitive Assessment   Visuospatial/ Executive (0/5) '4 5 1  '$ Naming (0/3) '2 3 2  '$ Attention: Read list of digits (0/2) '1 2 2  '$ Attention: Read list of letters (0/1) '1 1 1  '$ Attention: Serial 7 subtraction starting at 100 (0/3) '3 3 1  '$ Language: Repeat phrase (0/2) '2 1 2  '$ Language : Fluency (0/1) 0 1 0  Abstraction (0/2) '2 2 2  '$ Delayed Recall (0/5) '2 1 2  '$ Orientation (0/6) '6 6 6  '$ Total '23 25 19  '$ Adjusted Score (based on education) 23 25       CRANIAL  NERVES: CN II: Visual fields are full to confrontation. Pupils are round equal and briskly reactive to light. CN III, IV, VI: extraocular movement are normal. No ptosis. CN V: Facial sensation is intact to light touch CN VII: Face is symmetric with normal eye closure  CN VIII: Hearing is normal to causal conversation. CN IX, X: Phonation is normal. CN XI: Head turning and shoulder shrug are intact  MOTOR: There is no pronator drift of out-stretched arms. Muscle bulk and tone are normal. Muscle strength is normal.  REFLEXES: Reflexes are 1 and symmetric at the  biceps, triceps, knees, and ankles. Plantar responses are flexor.  SENSORY: Intact to light touch, pinprick and vibratory sensation are intact in fingers and toes.  COORDINATION: There is no trunk or limb dysmetria noted.  GAIT/STANCE: pushup, steady gait  REVIEW OF SYSTEMS:  Full 14 system review of systems performed and notable only for as above All other review of systems were negative.   ALLERGIES: Allergies  Allergen Reactions   Ciprofloxacin Other (See Comments)    Unknown (vertigo??)   Citalopram Hydrobromide Other (See Comments)    Intrusive/ odd thoughts    Flagyl [Metronidazole] Other (See Comments)    Unknown??   Milk-Related Compounds    Oxycodone Other (See Comments)    Headaches    HOME MEDICATIONS: Current Outpatient Medications  Medication Sig Dispense Refill   acetaminophen (TYLENOL) 500 MG tablet Take 500 mg by mouth every 6 (six) hours as needed.     aspirin-sod bicarb-citric acid (ALKA-SELTZER) 325 MG TBEF tablet Take 325 mg by mouth every 6 (six) hours as needed.     atorvastatin (LIPITOR) 40 MG tablet Take 1 tablet (40 mg total) by mouth daily. For cholesterol. 90 tablet 3   b complex vitamins tablet Take 1 tablet by mouth daily.     calcium carbonate (OS-CAL - DOSED IN MG OF ELEMENTAL CALCIUM) 1250 (500 CA) MG tablet Take 1 tablet by mouth daily.     Calcium Citrate (CITRACAL PO) Take by mouth.     carboxymethylcellulose (REFRESH PLUS) 0.5 % SOLN Place 1 drop into both eyes 3 (three) times daily as needed (for dryness).     Cholecalciferol (VITAMIN D3) 2000 UNITS capsule Take 2,000 Units by mouth daily.     Cranberry 125 MG TABS Take 2 tablets by mouth.     cyanocobalamin ([employer] VITAMIN B12) 2000 MCG tablet Take 1 tablet (2,000 mcg total) by mouth daily.     donepezil (ARICEPT) 10 MG tablet TAKE 1 TABLET BY MOUTH AT BEDTIME 90 tablet 1   escitalopram (LEXAPRO) 20 MG tablet Take 1 tablet (20 mg total) by mouth daily. 90 tablet 3   fluticasone (FLONASE)  50 MCG/ACT nasal spray 1 spray 2 times daily after sinus rinses- Neil med or AYR 16 g 6   levothyroxine (EUTHYROX) 75 MCG tablet Take 1 tablet (75 mcg total) by mouth daily. 90 tablet 3   loperamide (IMODIUM) 1 MG/5ML solution Take by mouth as needed for diarrhea or loose stools. Teaspoons amounts     memantine (NAMENDA) 10 MG tablet Take 1 tablet (10 mg total) by mouth 2 (two) times daily. 60 tablet 0   Multiple Vitamin (MULTIVITAMIN) tablet Take 1 tablet by mouth daily.     Omega-3 Fatty Acids (FISH OIL PO) Take 2 capsules by mouth 2 (two) times daily.     predniSONE (DELTASONE) 20 MG tablet Take 2 tablets (40 mg total) by mouth daily. 6 tablet 0   vitamin  C (ASCORBIC ACID) 500 MG tablet Take 500 mg by mouth daily.     No current facility-administered medications for this visit.    PAST MEDICAL HISTORY: Past Medical History:  Diagnosis Date   Adjustment disorder with mixed anxiety and depressed mood 12/12/2007   Qualifier: Diagnosis of  By: Copland MD, Spencer     Breast cancer ([place])    Cancer ([place]) 2000   breast had lumpectomy/radiation x36,mammo ,no chemo   Cataract of both eyes    Depression    Dysuria-frequency syndrome    takes AZO   Family history of heart disease 08/27/2013   Frozen shoulder    Gall stones    GERD 12/12/2007   Qualifier: Diagnosis of  By: Copland MD, Spencer     Hyperlipidemia    Hypothyroidism 12/12/2007   Qualifier: Diagnosis of  By: Lorelei Pont MD, Spencer     Memory difficulties 09/27/2017   Memory loss    Memory loss 09/22/2014   Osteopenia    BMD 2004, WNL 2008   Personal history of radiation therapy    Pneumonia    Recurrent UTI    Shingles    chronic body pain, left side of body   Shortness of breath dyspnea    when climbing stairs only   Wears glasses     PAST SURGICAL HISTORY: Past Surgical History:  Procedure Laterality Date   BREAST LUMPECTOMY Left 2000   CHOLECYSTECTOMY N/A 02/21/2015   Procedure: LAPAROSCOPIC CHOLECYSTECTOMY WITH  INTRAOPERATIVE CHOLANGIOGRAM;  Surgeon: Greer Pickerel, MD;  Location: [facility] OR;  Service: General;  Laterality: N/A;   COLONOSCOPY     MULTIPLE TOOTH EXTRACTIONS      FAMILY HISTORY: Family History  Problem Relation Age of Onset   Osteoporosis Mother    Parkinsonism Father    Breast cancer Sister 15   Breast cancer Other    Coronary artery disease Sister    Ovarian cancer Sister    Heart disease Maternal Grandmother    Heart disease Maternal Grandfather    Heart attack Maternal Grandfather 57   Heart disease Son    Heart attack Son 55    SOCIAL HISTORY: Social History   Socioeconomic History   Marital status: Married    Spouse name: Merchandiser, retail    Number of children: 2   Years of education: High school   Highest education level: High school graduate  Occupational History   Not on file  Tobacco Use   Smoking status: Never   Smokeless tobacco: Never  Vaping Use   Vaping Use: Never used  Substance and Sexual Activity   Alcohol use: No    Alcohol/week: 0.0 standard drinks of alcohol   Drug use: No   Sexual activity: Not Currently    Birth control/protection: Post-menopausal  Other Topics Concern   Not on file  Social History Narrative   Lives at home with her husband. Orpah Greek   2 adult sons  - [place] and [place] Hiss   2 Grandchildren - both other   Exercise: goes to [employer] - at least 2 times a week   Diet: anything that tastes good, less pepsi's with the tongue   Enjoys: square dance, eating out with friends, going to church - Sunday and Wednesday   Social Determinants of Health   Financial Resource Strain: Low Risk  (01/23/2021)   Overall Financial Resource Strain (CARDIA)    Difficulty of Paying Living Expenses: Not hard at all  Food Insecurity: No Food Insecurity (01/23/2021)  Hunger Vital Sign    Worried About Running Out of Food in the Last Year: Never true    Ran Out of Food in the Last Year: Never true  Transportation Needs: No Transportation Needs  (01/23/2021)   PRAPARE - Hydrologist (Medical): No    Lack of Transportation (Non-Medical): No  Physical Activity: Insufficiently Active (01/23/2021)   Exercise Vital Sign    Days of Exercise per Week: 2 days    Minutes of Exercise per Session: 30 min  Stress: No Stress Concern Present (01/23/2021)   [place]    Feeling of Stress : Not at all  Social Connections: [place] (01/23/2021)   Social Connection and Isolation Panel [NHANES]    Frequency of Communication with Friends and Family: Three times a week    Frequency of Social Gatherings with Friends and Family: Three times a week    Attends Religious Services: More than 4 times per year    Active Member of Clubs or Organizations: Yes    Attends Archivist Meetings: More than 4 times per year    Marital Status: Married  Human resources officer Violence: Not At Risk (01/23/2021)   Humiliation, Afraid, Rape, and Kick questionnaire    Fear of Current or Ex-Partner: No    Emotionally Abused: No    Physically Abused: No    Sexually Abused: No      Marcial Pacas, M.D. Ph.D.  [facility] [address] [place], [place] [address] Ph: [phone] Fax: [phone]  CC:  Waunita Schooner, [place],  [place] [address]  Waunita Schooner, MD

## 2022-01-24 ENCOUNTER — Ambulatory Visit (INDEPENDENT_AMBULATORY_CARE_PROVIDER_SITE_OTHER): Payer: Medicare HMO

## 2022-01-24 VITALS — Wt 181.0 lb

## 2022-01-24 DIAGNOSIS — Z0000 Encounter for general adult medical examination without abnormal findings: Secondary | ICD-10-CM

## 2022-01-24 NOTE — Patient Instructions (Signed)
Frances Hoffman , Thank you for taking time to come for your Medicare Wellness Visit. I appreciate your ongoing commitment to your health goals. Please review the following plan we discussed and let me know if I can assist you in the future.   Screening recommendations/referrals: Colonoscopy: 03/26/18 Mammogram: 08/14/21 Bone Density: 02/20/21 Recommended yearly ophthalmology/optometry visit for glaucoma screening and checkup Recommended yearly dental visit for hygiene and checkup  Vaccinations: Influenza vaccine: 12/09/20 Pneumococcal vaccine: 04/28/14 Tdap vaccine: 11/06/16 Shingles vaccine: Zostavax 04/05/13   Shingrix   01/16/19, 05/01/19 Covid-19:03/24/19, 04/14/19, 12/15/19, 12/09/20  Advanced directives: no  Conditions/risks identified: none  Next appointment: Follow up in one year for your annual wellness visit 01/28/23 @ 10:30 am by phone   Preventive Care 65 Years and Older, Female Preventive care refers to lifestyle choices and visits with your health care provider that can promote health and wellness. What does preventive care include? A yearly physical exam. This is also called an annual well check. Dental exams once or twice a year. Routine eye exams. Ask your health care provider how often you should have your eyes checked. Personal lifestyle choices, including: Daily care of your teeth and gums. Regular physical activity. Eating a healthy diet. Avoiding tobacco and drug use. Limiting alcohol use. Practicing safe sex. Taking low-dose aspirin every day. Taking vitamin and mineral supplements as recommended by your health care provider. What happens during an annual well check? The services and screenings done by your health care provider during your annual well check will depend on your age, overall health, lifestyle risk factors, and family history of disease. Counseling  Your health care provider may ask you questions about your: Alcohol use. Tobacco use. Drug  use. Emotional well-being. Home and relationship well-being. Sexual activity. Eating habits. History of falls. Memory and ability to understand (cognition). Work and work Statistician. Reproductive health. Screening  You may have the following tests or measurements: Height, weight, and BMI. Blood pressure. Lipid and cholesterol levels. These may be checked every 5 years, or more frequently if you are over 71 years old. Skin check. Lung cancer screening. You may have this screening every year starting at age 81 if you have a 30-pack-year history of smoking and currently smoke or have quit within the past 15 years. Fecal occult blood test (FOBT) of the stool. You may have this test every year starting at age 81. Flexible sigmoidoscopy or colonoscopy. You may have a sigmoidoscopy every 5 years or a colonoscopy every 10 years starting at age 81. Hepatitis C blood test. Hepatitis B blood test. Sexually transmitted disease (STD) testing. Diabetes screening. This is done by checking your blood sugar (glucose) after you have not eaten for a while (fasting). You may have this done every 1-3 years. Bone density scan. This is done to screen for osteoporosis. You may have this done starting at age 81. Mammogram. This may be done every 1-2 years. Talk to your health care provider about how often you should have regular mammograms. Talk with your health care provider about your test results, treatment options, and if necessary, the need for more tests. Vaccines  Your health care provider may recommend certain vaccines, such as: Influenza vaccine. This is recommended every year. Tetanus, diphtheria, and acellular pertussis (Tdap, Td) vaccine. You may need a Td booster every 10 years. Zoster vaccine. You may need this after age 81. Pneumococcal 13-valent conjugate (PCV13) vaccine. One dose is recommended after age 81. Pneumococcal polysaccharide (PPSV23) vaccine. One dose is recommended after  age  81. Talk to your health care provider about which screenings and vaccines you need and how often you need them. This information is not intended to replace advice given to you by your health care provider. Make sure you discuss any questions you have with your health care provider. Document Released: 03/18/2015 Document Revised: 11/09/2015 Document Reviewed: 12/21/2014 Elsevier Interactive Patient Education  2017 [employer] Prevention in the Home Falls can cause injuries. They can happen to people of all ages. There are many things you can do to make your home safe and to help prevent falls. What can I do on the outside of my home? Regularly fix the edges of walkways and driveways and fix any cracks. Remove anything that might make you trip as you walk through a door, such as a raised step or threshold. Trim any bushes or trees on the path to your home. Use bright outdoor lighting. Clear any walking paths of anything that might make someone trip, such as rocks or tools. Regularly check to see if handrails are loose or broken. Make sure that both sides of any steps have handrails. Any raised decks and porches should have guardrails on the edges. Have any leaves, snow, or ice cleared regularly. Use sand or salt on walking paths during winter. Clean up any spills in your garage right away. This includes oil or grease spills. What can I do in the bathroom? Use night lights. Install grab bars by the toilet and in the tub and shower. Do not use towel bars as grab bars. Use non-skid mats or decals in the tub or shower. If you need to sit down in the shower, use a plastic, non-slip stool. Keep the floor dry. Clean up any water that spills on the floor as soon as it happens. Remove soap buildup in the tub or shower regularly. Attach bath mats securely with double-sided non-slip rug tape. Do not have throw rugs and other things on the floor that can make you trip. What can I do in the  bedroom? Use night lights. Make sure that you have a light by your bed that is easy to reach. Do not use any sheets or blankets that are too big for your bed. They should not hang down onto the floor. Have a firm chair that has side arms. You can use this for support while you get dressed. Do not have throw rugs and other things on the floor that can make you trip. What can I do in the kitchen? Clean up any spills right away. Avoid walking on wet floors. Keep items that you use a lot in easy-to-reach places. If you need to reach something above you, use a strong step stool that has a grab bar. Keep electrical cords out of the way. Do not use floor polish or wax that makes floors slippery. If you must use wax, use non-skid floor wax. Do not have throw rugs and other things on the floor that can make you trip. What can I do with my stairs? Do not leave any items on the stairs. Make sure that there are handrails on both sides of the stairs and use them. Fix handrails that are broken or loose. Make sure that handrails are as long as the stairways. Check any carpeting to make sure that it is firmly attached to the stairs. Fix any carpet that is loose or worn. Avoid having throw rugs at the top or bottom of the stairs. If you do  have throw rugs, attach them to the floor with carpet tape. Make sure that you have a light switch at the top of the stairs and the bottom of the stairs. If you do not have them, ask someone to add them for you. What else can I do to help prevent falls? Wear shoes that: Do not have high heels. Have rubber bottoms. Are comfortable and fit you well. Are closed at the toe. Do not wear sandals. If you use a stepladder: Make sure that it is fully opened. Do not climb a closed stepladder. Make sure that both sides of the stepladder are locked into place. Ask someone to hold it for you, if possible. Clearly mark and make sure that you can see: Any grab bars or  handrails. First and last steps. Where the edge of each step is. Use tools that help you move around (mobility aids) if they are needed. These include: Canes. Walkers. Scooters. Crutches. Turn on the lights when you go into a dark area. Replace any light bulbs as soon as they burn out. Set up your furniture so you have a clear path. Avoid moving your furniture around. If any of your floors are uneven, fix them. If there are any pets around you, be aware of where they are. Review your medicines with your doctor. Some medicines can make you feel dizzy. This can increase your chance of falling. Ask your doctor what other things that you can do to help prevent falls. This information is not intended to replace advice given to you by your health care provider. Make sure you discuss any questions you have with your health care provider. Document Released: 12/16/2008 Document Revised: 07/28/2015 Document Reviewed: 03/26/2014 Elsevier Interactive Patient Education  2017 [employer].

## 2022-01-24 NOTE — Progress Notes (Signed)
Virtual Visit via Telephone Note  I connected with  Frances Hoffman on 01/24/22 at  9:00 AM EST by telephone and verified that I am speaking with the correct person using two identifiers.  Location: Patient: home Provider: [place] Persons participating in the virtual visit: [place]   I discussed the limitations, risks, security and privacy concerns of performing an evaluation and management service by telephone and the availability of in person appointments. The patient expressed understanding and agreed to proceed.  Interactive audio and video telecommunications were attempted between this nurse and patient, however failed, due to patient having technical difficulties OR patient did not have access to video capability.  We continued and completed visit with audio only.  Some vital signs may be absent or patient reported.   Dionisio David, LPN  Subjective:   Frances Hoffman is a 81 y.o. female who presents for Medicare Annual (Subsequent) preventive examination.  Review of Systems     Cardiac Risk Factors include: advanced age (>82mn, >>25women);dyslipidemia     Objective:    There were no vitals filed for this visit. There is no height or weight on file to calculate BMI.     01/24/2022    9:12 AM 04/17/2021    4:07 PM 04/17/2021   10:38 AM 01/23/2021    9:58 AM 01/21/2020   10:35 AM 01/08/2019   11:16 AM 02/23/2018    1:27 PM  Advanced Directives  Does Patient Have a Medical Advance Directive? No Yes No Yes Yes Yes No  Type of ACorporate [place]f [place] will  [place] will Healthcare Power of [place] will   Does patient want to make changes to medical advance directive?  No - Patient declined  Yes (MAU/Ambulatory/Procedural Areas - Information given)     Copy of [place] Chart?  No - copy requested   No - copy requested No - copy  requested   Would patient like information on creating a medical advance directive? No - Patient declined      No - Patient declined    Current Medications (verified) Outpatient Encounter Medications as of 01/24/2022  Medication Sig   acetaminophen (TYLENOL) 500 MG tablet Take 500 mg by mouth every 6 (six) hours as needed.   aspirin-sod bicarb-citric acid (ALKA-SELTZER) 325 MG TBEF tablet Take 325 mg by mouth every 6 (six) hours as needed.   atorvastatin (LIPITOR) 40 MG tablet Take 1 tablet (40 mg total) by mouth daily. For cholesterol.   b complex vitamins tablet Take 1 tablet by mouth daily.   carboxymethylcellulose (REFRESH PLUS) 0.5 % SOLN Place 1 drop into both eyes 3 (three) times daily as needed (for dryness).   Cholecalciferol (VITAMIN D3) 2000 UNITS capsule Take 2,000 Units by mouth daily.   Cranberry 125 MG TABS Take 2 tablets by mouth.   cyanocobalamin ([employer] VITAMIN B12) 2000 MCG tablet Take 1 tablet (2,000 mcg total) by mouth daily.   donepezil (ARICEPT) 10 MG tablet Take 1 tablet (10 mg total) by mouth at bedtime.   escitalopram (LEXAPRO) 20 MG tablet Take 1 tablet (20 mg total) by mouth daily.   fluticasone (FLONASE) 50 MCG/ACT nasal spray 1 spray 2 times daily after sinus rinses- NMilta Deitersmed or AYR   levothyroxine (EUTHYROX) 75 MCG tablet Take 1 tablet (75 mcg total) by mouth daily.   loperamide (IMODIUM) 1 MG/5ML solution Take by mouth as needed  for diarrhea or loose stools. Teaspoons amounts   memantine (NAMENDA) 10 MG tablet Take 1 tablet (10 mg total) by mouth 2 (two) times daily.   Multiple Vitamin (MULTIVITAMIN) tablet Take 1 tablet by mouth daily.   Omega-3 Fatty Acids (FISH OIL PO) Take 2 capsules by mouth 2 (two) times daily.   calcium carbonate (OS-CAL - DOSED IN MG OF ELEMENTAL CALCIUM) 1250 (500 CA) MG tablet Take 1 tablet by mouth daily. (Patient not taking: Reported on 01/24/2022)   Calcium Citrate (CITRACAL PO) Take by mouth. (Patient not taking: Reported on  01/24/2022)   predniSONE (DELTASONE) 20 MG tablet Take 2 tablets (40 mg total) by mouth daily. (Patient not taking: Reported on 01/24/2022)   vitamin C (ASCORBIC ACID) 500 MG tablet Take 500 mg by mouth daily. (Patient not taking: Reported on 01/24/2022)   No facility-administered encounter medications on file as of 01/24/2022.    Allergies (verified) Ciprofloxacin, Citalopram hydrobromide, Flagyl [metronidazole], Milk-related compounds, and Oxycodone   History: Past Medical History:  Diagnosis Date   Adjustment disorder with mixed anxiety and depressed mood 12/12/2007   Qualifier: Diagnosis of  By: Copland MD, [place]     Breast cancer ([place])    Cancer ([place]) 2000   breast had lumpectomy/radiation x36,mammo ,no chemo   Cataract of both eyes    Depression    Dysuria-frequency syndrome    takes AZO   Family history of heart disease 08/27/2013   Frozen shoulder    Gall stones    GERD 12/12/2007   Qualifier: Diagnosis of  By: Copland MD, Spencer     Hyperlipidemia    Hypothyroidism 12/12/2007   Qualifier: Diagnosis of  By: Lorelei Pont MD, Spencer     Memory difficulties 09/27/2017   Memory loss    Memory loss 09/22/2014   Osteopenia    BMD 2004, WNL 2008   Personal history of radiation therapy    Pneumonia    Recurrent UTI    Shingles    chronic body pain, left side of body   Shortness of breath dyspnea    when climbing stairs only   Wears glasses    Past Surgical History:  Procedure Laterality Date   BREAST LUMPECTOMY Left 2000   CHOLECYSTECTOMY N/A 02/21/2015   Procedure: LAPAROSCOPIC CHOLECYSTECTOMY WITH INTRAOPERATIVE CHOLANGIOGRAM;  Surgeon: Greer Pickerel, MD;  Location: [facility] [place];  Service: General;  Laterality: N/A;   COLONOSCOPY     MULTIPLE TOOTH EXTRACTIONS     Family History  Problem Relation Age of Onset   Osteoporosis Mother    Parkinsonism Father    Breast cancer Sister 35   Breast cancer Other    Coronary artery disease Sister    Ovarian cancer Sister    Heart  disease Maternal Grandmother    Heart disease Maternal Grandfather    Heart attack Maternal Grandfather 80   Heart disease Son    Heart attack Son 26   Social History   Socioeconomic History   Marital status: Married    Spouse name: Orpah Greek    Number of children: 2   Years of education: High school   Highest education level: High school graduate  Occupational History   Not on file  Tobacco Use   Smoking status: Never   Smokeless tobacco: Never  Vaping Use   Vaping Use: Never used  Substance and Sexual Activity   Alcohol use: No    Alcohol/week: 0.0 standard drinks of alcohol   Drug use: No   Sexual activity: Not  Currently    Birth control/protection: Post-menopausal  Other Topics Concern   Not on file  Social History Narrative   Lives at home with her husband. Orpah Greek   2 adult sons  - [place] and Randall Hiss   2 Grandchildren - both other   Exercise: goes to [employer] - at least 2 times a week   Diet: anything that tastes good, less pepsi's with the tongue   Enjoys: square dance, eating out with friends, going to church - Sunday and Wednesday   Social Determinants of Health   Financial Resource Strain: Low Risk  (01/24/2022)   Overall Financial Resource Strain (CARDIA)    Difficulty of Paying Living Expenses: Not hard at all  Food Insecurity: No Food Insecurity (01/24/2022)   Hunger Vital Sign    Worried About Running Out of Food in the Last Year: Never true    [place] in the Last Year: Never true  Transportation Needs: No Transportation Needs (01/24/2022)   PRAPARE - Hydrologist (Medical): No    Lack of Transportation (Non-Medical): No  Physical Activity: Insufficiently Active (01/24/2022)   Exercise Vital Sign    Days of Exercise per Week: 3 days    Minutes of Exercise per Session: 30 min  Stress: No Stress Concern Present (01/24/2022)   [place]     Feeling of Stress : Only a little  Social Connections: Moderately Integrated (01/24/2022)   Social Connection and Isolation Panel [NHANES]    Frequency of Communication with Friends and Family: More than three times a week    Frequency of Social Gatherings with Friends and Family: Once a week    Attends Religious Services: More than 4 times per year    Active Member of [employer] or Organizations: No    Attends Music therapist: Never    Marital Status: Married    Tobacco Counseling Counseling given: Not Answered   Clinical Intake:  Pre-visit preparation completed: Yes  Pain : No/denies pain     Diabetes: No  How often do you need to have someone help you when you read instructions, pamphlets, or other written materials from your doctor or pharmacy?: 1 - Never  Diabetic?no  Interpreter Needed?: No  Information entered by :: Kirke Shaggy, LPN   Activities of Daily Living    01/24/2022    9:13 AM 04/18/2021   12:00 AM  In your present state of health, do you have any difficulty performing the following activities:  Hearing? 0 0  Vision? 0 0  Difficulty concentrating or making decisions? 0 0  Walking or climbing stairs? 0 0  Dressing or bathing? 0 0  Doing errands, shopping? 0 0  Preparing Food and eating ? N   Using the Toilet? N   In the past six months, have you accidently leaked urine? N   Do you have problems with loss of bowel control? N   Managing your Medications? N   Managing your Finances? N   Housekeeping or managing your Housekeeping? N     Patient Care Team: Waunita Schooner, MD as PCP - General (Family Medicine)  Indicate any recent Medical Services you may have received from other than [facility] providers in the past year (date may be approximate).     Assessment:   This is a routine wellness examination for Sissy.  Hearing/Vision screen Hearing Screening - Comments:: No aids Vision Screening - Comments:: Wears  glasses-  Dr.Groat  Dietary issues and exercise activities discussed: Current Exercise Habits: Home exercise routine, Type of exercise: walking, Time (Minutes): 30, Frequency (Times/Week): 3, Weekly Exercise (Minutes/Week): 90, Intensity: Mild   Goals Addressed             This Visit's Progress    DIET - EAT MORE FRUITS AND VEGETABLES         Depression Screen    01/24/2022    9:10 AM 06/14/2021    3:20 PM 04/19/2021   11:45 AM 01/23/2021   10:02 AM 01/21/2020   11:07 AM 01/21/2020   10:36 AM 01/08/2019   11:18 AM  PHQ 2/9 Scores  PHQ - 2 Score 1 0 4 0 2 2 0  PHQ- 9 Score '1  12  4  '$ 0    Fall Risk    01/24/2022    9:12 AM 06/14/2021    3:20 PM 01/23/2021   10:00 AM 01/21/2020   10:35 AM 01/21/2020   10:14 AM  Fall Risk   Falls in the past year? 0 0 0 0 0  Number falls in past yr: 0 0 0 0 0  Injury with Fall? 0 0 0 0   Risk for fall due to : No Fall Risks No Fall Risks  No Fall Risks   Follow up Falls prevention discussed;Falls evaluation completed Falls evaluation completed Falls prevention discussed Falls evaluation completed     FALL RISK PREVENTION PERTAINING TO THE HOME:  Any stairs in or around the home? No  If so, are there any without handrails? No  Home free of loose throw rugs in walkways, pet beds, electrical cords, etc? Yes  Adequate lighting in your home to reduce risk of falls? Yes   ASSISTIVE DEVICES UTILIZED TO PREVENT FALLS:  Life alert? No  Use of a cane, walker or w/c? No  Grab bars in the bathroom? Yes  Shower chair or bench in shower? No  Elevated toilet seat or a handicapped toilet? No    Cognitive Function:    01/08/2019   11:21 AM 03/28/2017    7:50 AM 03/26/2017   10:47 AM 09/21/2015    2:16 PM  MMSE - Mini Mental State Exam  Orientation to time '5 4 5 4  '$ Orientation to Place '5 5 5 5  '$ Registration '3 3 3 3  '$ Attention/ Calculation '5 5 2 5  '$ Recall '3 2 1 2  '$ Language- name 2 objects  '2 2 2  '$ Language- repeat '1 1 1 1  '$ Language- follow 3 step  command  '3 3 3  '$ Language- read & follow direction  '1 1 1  '$ Write a sentence  '1 1 1  '$ Copy design  1 1 0  Total score  '28 25 27      '$ 01/04/2022    1:40 PM 11/24/2020   10:55 AM 08/23/2020    3:21 PM  Montreal Cognitive Assessment   Visuospatial/ Executive (0/5) '4 5 1  '$ Naming (0/3) '2 3 2  '$ Attention: Read list of digits (0/2) '1 2 2  '$ Attention: Read list of letters (0/1) '1 1 1  '$ Attention: Serial 7 subtraction starting at 100 (0/3) '3 3 1  '$ Language: Repeat phrase (0/2) '2 1 2  '$ Language : Fluency (0/1) 0 1 0  Abstraction (0/2) '2 2 2  '$ Delayed Recall (0/5) '2 1 2  '$ Orientation (0/6) '6 6 6  '$ Total '23 25 19  '$ Adjusted Score (based on education) 23 25  01/24/2022    9:16 AM 03/26/2017   10:44 AM 11/14/2016   10:13 AM  6CIT Screen  What Year? 0 points 0 points 0 points  What month? 0 points 0 points 0 points  What time? 0 points 0 points 0 points  Count back from 20 0 points 0 points 0 points  Months in reverse  4 points 0 points  Repeat phrase 8 points 6 points 2 points  Total Score  10 points 2 points    Immunizations Immunization History  Administered Date(s) Administered   Fluad Quad(high Dose 65+) 12/15/2019   Influenza Split 01/04/2011   Influenza Whole 01/03/2009, 12/06/2009   Influenza, High Dose Seasonal PF 12/11/2017, 11/27/2018   Influenza,inj,Quad PF,6+ Mos 12/02/2012, 11/30/2014, 11/30/2015   Influenza-Unspecified 12/04/2013, 12/09/2020   PFIZER(Purple Top)SARS-COV-2 Vaccination 03/24/2019, 04/14/2019, 12/15/2019, 12/09/2020   PPD Test 12/31/2017   Pneumococcal Conjugate-13 04/28/2014   Pneumococcal Polysaccharide-23 11/26/2008   Td 03/05/2006   Tdap 11/06/2016   Zoster Recombinat (Shingrix) 01/16/2019, 05/01/2019   Zoster, Live 04/05/2013    TDAP status: Up to date  Flu Vaccine status: Up to date  Pneumococcal vaccine status: Up to date  Covid-19 vaccine status: Completed vaccines  Qualifies for Shingles Vaccine? Yes   Zostavax completed Yes   Shingrix  Completed?: Yes  Screening Tests Health Maintenance  Topic Date Due   COVID-19 Vaccine (5 - 2023-24 season) 11/03/2021   INFLUENZA VACCINE  06/03/2022 (Originally 10/03/2021)   MAMMOGRAM  08/15/2022   Medicare Annual Wellness (AWV)  01/25/2023   Pneumonia Vaccine 53+ Years old  Completed   DEXA SCAN  Completed   Zoster Vaccines- Shingrix  Completed   HPV VACCINES  Aged Out    Health Maintenance  Health Maintenance Due  Topic Date Due   COVID-19 Vaccine (5 - 2023-24 season) 11/03/2021    Colorectal cancer screening: No longer required.   Mammogram status: Completed 08/14/21. Repeat every year  Bone Density status: Completed 02/20/21. Results reflect: Bone density results: OSTEOPENIA. Repeat every 5 years.  Lung Cancer Screening: (Low Dose CT Chest recommended if Age 52-80 years, 30 pack-year currently smoking OR have quit w/in 15years.) does not qualify.    Additional Screening:  Hepatitis C Screening: does not qualify; Completed no  Vision Screening: Recommended annual ophthalmology exams for early detection of glaucoma and other disorders of the eye. Is the patient up to date with their annual eye exam?  Yes  Who is the provider or what is the name of the office in which the patient attends annual eye exams? Dr.Groat If pt is not established with a provider, would they like to be referred to a provider to establish care? No .   Dental Screening: Recommended annual dental exams for proper oral hygiene  Community Resource Referral / Chronic Care Management: CRR required this visit?  No   CCM required this visit?  No      Plan:     I have personally reviewed and noted the following in the patient's chart:   Medical and social history Use of alcohol, tobacco or illicit drugs  Current medications and supplements including opioid prescriptions. Patient is not currently taking opioid prescriptions. Functional ability and status Nutritional status Physical  activity Advanced directives List of other physicians Hospitalizations, surgeries, and ER visits in previous 12 months Vitals Screenings to include cognitive, depression, and falls Referrals and appointments  In addition, I have reviewed and discussed with patient certain preventive protocols, quality metrics, and best practice recommendations. A written  personalized care plan for preventive services as well as general preventive health recommendations were provided to patient.     Dionisio David, LPN   [id number]   Nurse Notes: none

## 2022-02-13 ENCOUNTER — Other Ambulatory Visit: Payer: Self-pay | Admitting: Family Medicine

## 2022-02-13 DIAGNOSIS — E039 Hypothyroidism, unspecified: Secondary | ICD-10-CM

## 2022-02-13 DIAGNOSIS — N1831 Chronic kidney disease, stage 3a: Secondary | ICD-10-CM

## 2022-02-13 DIAGNOSIS — E538 Deficiency of other specified B group vitamins: Secondary | ICD-10-CM

## 2022-02-13 DIAGNOSIS — R7303 Prediabetes: Secondary | ICD-10-CM

## 2022-02-13 DIAGNOSIS — E7849 Other hyperlipidemia: Secondary | ICD-10-CM

## 2022-02-13 DIAGNOSIS — M8580 Other specified disorders of bone density and structure, unspecified site: Secondary | ICD-10-CM

## 2022-02-15 ENCOUNTER — Other Ambulatory Visit: Payer: Medicare HMO

## 2022-02-15 ENCOUNTER — Other Ambulatory Visit (INDEPENDENT_AMBULATORY_CARE_PROVIDER_SITE_OTHER): Payer: Medicare HMO

## 2022-02-15 DIAGNOSIS — M8580 Other specified disorders of bone density and structure, unspecified site: Secondary | ICD-10-CM

## 2022-02-15 DIAGNOSIS — N1831 Chronic kidney disease, stage 3a: Secondary | ICD-10-CM | POA: Diagnosis not present

## 2022-02-15 DIAGNOSIS — E7849 Other hyperlipidemia: Secondary | ICD-10-CM

## 2022-02-15 DIAGNOSIS — E538 Deficiency of other specified B group vitamins: Secondary | ICD-10-CM

## 2022-02-15 DIAGNOSIS — R7303 Prediabetes: Secondary | ICD-10-CM | POA: Diagnosis not present

## 2022-02-15 DIAGNOSIS — E039 Hypothyroidism, unspecified: Secondary | ICD-10-CM

## 2022-02-15 LAB — CBC WITH DIFFERENTIAL/PLATELET
Basophils Absolute: 0 10*3/uL (ref 0.0–0.1)
Basophils Relative: 0.5 % (ref 0.0–3.0)
Eosinophils Absolute: 0.1 10*3/uL (ref 0.0–0.7)
Eosinophils Relative: 0.9 % (ref 0.0–5.0)
HCT: 44.7 % (ref 36.0–46.0)
Hemoglobin: 15.1 g/dL — ABNORMAL HIGH (ref 12.0–15.0)
Lymphocytes Relative: 28.8 % (ref 12.0–46.0)
Lymphs Abs: 2.2 10*3/uL (ref 0.7–4.0)
MCHC: 33.8 g/dL (ref 30.0–36.0)
MCV: 93.1 fl (ref 78.0–100.0)
Monocytes Absolute: 0.7 10*3/uL (ref 0.1–1.0)
Monocytes Relative: 9.4 % (ref 3.0–12.0)
Neutro Abs: 4.5 10*3/uL (ref 1.4–7.7)
Neutrophils Relative %: 60.4 % (ref 43.0–77.0)
Platelets: 288 10*3/uL (ref 150.0–400.0)
RBC: 4.8 Mil/uL (ref 3.87–5.11)
RDW: 14.6 % (ref 11.5–15.5)
WBC: 7.5 10*3/uL (ref 4.0–10.5)

## 2022-02-15 LAB — LIPID PANEL
Cholesterol: 172 mg/dL (ref 0–200)
HDL: 42.8 mg/dL (ref >39.00)
LDL Cholesterol: 91 mg/dL (ref 0–99)
NonHDL: 129.34
Total CHOL/HDL Ratio: 4
Triglycerides: 191 mg/dL — ABNORMAL HIGH (ref 0.0–149.0)
VLDL: 38.2 mg/dL (ref 0.0–40.0)

## 2022-02-15 LAB — COMPREHENSIVE METABOLIC PANEL
ALT: 14 U/L (ref 0–35)
AST: 19 U/L (ref 0–37)
Albumin: 4.1 g/dL (ref 3.5–5.2)
Alkaline Phosphatase: 97 U/L (ref 39–117)
BUN: 13 mg/dL (ref 6–23)
CO2: 32 mEq/L (ref 19–32)
Calcium: 10.1 mg/dL (ref 8.4–10.5)
Chloride: 103 mEq/L (ref 96–112)
Creatinine, Ser: 1.1 mg/dL (ref 0.40–1.20)
GFR: 47.16 mL/min — ABNORMAL LOW (ref >60.00)
Glucose, Bld: 90 mg/dL (ref 70–99)
Potassium: 4.5 mEq/L (ref 3.5–5.1)
Sodium: 141 mEq/L (ref 135–145)
Total Bilirubin: 0.8 mg/dL (ref 0.2–1.2)
Total Protein: 6.8 g/dL (ref 6.0–8.3)

## 2022-02-15 LAB — MICROALBUMIN / CREATININE URINE RATIO
Creatinine,U: 164.9 mg/dL
Microalb Creat Ratio: 0.5 mg/g (ref 0.0–30.0)
Microalb, Ur: 0.9 mg/dL (ref 0.0–1.9)

## 2022-02-15 LAB — HEMOGLOBIN A1C: Hgb A1c MFr Bld: 6.1 % (ref 4.6–6.5)

## 2022-02-15 LAB — TSH: TSH: 4.97 u[IU]/mL (ref 0.35–5.50)

## 2022-02-15 LAB — VITAMIN B12: Vitamin B-12: 404 pg/mL (ref 211–911)

## 2022-02-15 LAB — VITAMIN D 25 HYDROXY (VIT D DEFICIENCY, FRACTURES): VITD: 68.44 ng/mL (ref 30.00–100.00)

## 2022-02-19 ENCOUNTER — Encounter: Payer: Medicare HMO | Admitting: Family Medicine

## 2022-02-20 ENCOUNTER — Encounter: Payer: Self-pay | Admitting: Family Medicine

## 2022-02-20 ENCOUNTER — Ambulatory Visit (INDEPENDENT_AMBULATORY_CARE_PROVIDER_SITE_OTHER): Payer: Medicare HMO | Admitting: Family Medicine

## 2022-02-20 VITALS — BP 116/68 | HR 81 | Temp 97.7°F | Ht 62.25 in | Wt 179.0 lb

## 2022-02-20 DIAGNOSIS — N390 Urinary tract infection, site not specified: Secondary | ICD-10-CM

## 2022-02-20 DIAGNOSIS — N1831 Chronic kidney disease, stage 3a: Secondary | ICD-10-CM

## 2022-02-20 DIAGNOSIS — Z0000 Encounter for general adult medical examination without abnormal findings: Secondary | ICD-10-CM | POA: Diagnosis not present

## 2022-02-20 DIAGNOSIS — G3184 Mild cognitive impairment, so stated: Secondary | ICD-10-CM

## 2022-02-20 DIAGNOSIS — F331 Major depressive disorder, recurrent, moderate: Secondary | ICD-10-CM

## 2022-02-20 DIAGNOSIS — E538 Deficiency of other specified B group vitamins: Secondary | ICD-10-CM | POA: Diagnosis not present

## 2022-02-20 DIAGNOSIS — M85851 Other specified disorders of bone density and structure, right thigh: Secondary | ICD-10-CM

## 2022-02-20 DIAGNOSIS — E669 Obesity, unspecified: Secondary | ICD-10-CM | POA: Diagnosis not present

## 2022-02-20 DIAGNOSIS — E7849 Other hyperlipidemia: Secondary | ICD-10-CM

## 2022-02-20 DIAGNOSIS — J309 Allergic rhinitis, unspecified: Secondary | ICD-10-CM

## 2022-02-20 DIAGNOSIS — E039 Hypothyroidism, unspecified: Secondary | ICD-10-CM

## 2022-02-20 DIAGNOSIS — Z7189 Other specified counseling: Secondary | ICD-10-CM | POA: Diagnosis not present

## 2022-02-20 DIAGNOSIS — R69 Illness, unspecified: Secondary | ICD-10-CM | POA: Diagnosis not present

## 2022-02-20 DIAGNOSIS — R7303 Prediabetes: Secondary | ICD-10-CM | POA: Diagnosis not present

## 2022-02-20 DIAGNOSIS — F0150 Vascular dementia without behavioral disturbance: Secondary | ICD-10-CM

## 2022-02-20 DIAGNOSIS — E66811 Obesity, class 1: Secondary | ICD-10-CM

## 2022-02-20 MED ORDER — LEVOTHYROXINE SODIUM 75 MCG PO TABS: 75.0000 ug | ORAL_TABLET | Freq: Every day | ORAL | 4 refills | Status: DC

## 2022-02-20 MED ORDER — ATORVASTATIN CALCIUM 40 MG PO TABS: 40.0000 mg | ORAL_TABLET | Freq: Every day | ORAL | 4 refills | Status: DC

## 2022-02-20 MED ORDER — ESCITALOPRAM OXALATE 20 MG PO TABS: 20.0000 mg | ORAL_TABLET | Freq: Every day | ORAL | 4 refills | Status: DC

## 2022-02-20 NOTE — Patient Instructions (Addendum)
Nice to meet you today!  Bring [place] a copy of your advanced directive to update your chart.  Start allegra or claritin daily - over the counter - for runny nose, use as needed.  Good to see you today Return in 6 months for follow up visit.   4 core lifestyle modifications to support a healthy mind:  1. Nutritious well balance diet.  2. Regular physical activity routine.  3. Regular mental activity such as reading books, word puzzles, math puzzles, jigsaw puzzles.  4. Social engagement.  Also ensure good blood pressure control, limit alcohol, no smoking.    Health Maintenance After Age 49 After age 28, you are at a higher risk for certain long-term diseases and infections as well as injuries from falls. Falls are a major cause of broken bones and head injuries in people who are older than age 21. Getting regular preventive care can help to keep you healthy and well. Preventive care includes getting regular testing and making lifestyle changes as recommended by your health care provider. Talk with your health care provider about: Which screenings and tests you should have. A screening is a test that checks for a disease when you have no symptoms. A diet and exercise plan that is right for you. What should I know about screenings and tests to prevent falls? Screening and testing are the best ways to find a health problem early. Early diagnosis and treatment give you the best chance of managing medical conditions that are common after age 49. Certain conditions and lifestyle choices may make you more likely to have a fall. Your health care provider may recommend: Regular vision checks. Poor vision and conditions such as cataracts can make you more likely to have a fall. If you wear glasses, make sure to get your prescription updated if your vision changes. Medicine review. Work with your health care provider to regularly review all of the medicines you are taking, including over-the-counter medicines.  Ask your health care provider about any side effects that may make you more likely to have a fall. Tell your health care provider if any medicines that you take make you feel dizzy or sleepy. Strength and balance checks. Your health care provider may recommend certain tests to check your strength and balance while standing, walking, or changing positions. Foot health exam. Foot pain and numbness, as well as not wearing proper footwear, can make you more likely to have a fall. Screenings, including: Osteoporosis screening. Osteoporosis is a condition that causes the bones to get weaker and break more easily. Blood pressure screening. Blood pressure changes and medicines to control blood pressure can make you feel dizzy. Depression screening. You may be more likely to have a fall if you have a fear of falling, feel depressed, or feel unable to do activities that you used to do. Alcohol use screening. Using too much alcohol can affect your balance and may make you more likely to have a fall. Follow these instructions at home: Lifestyle Do not drink alcohol if: Your health care provider tells you not to drink. If you drink alcohol: Limit how much you have to: 0-1 drink a day for women. 0-2 drinks a day for men. Know how much alcohol is in your drink. In the [place] one drink equals one 12 oz bottle of beer (355 mL), one 5 oz glass of wine (148 mL), or one 1 oz glass of hard liquor (44 mL). Do not use any products that contain nicotine or tobacco.  These products include cigarettes, chewing tobacco, and vaping devices, such as e-cigarettes. If you need help quitting, ask your health care provider. Activity  Follow a regular exercise program to stay fit. This will help you maintain your balance. Ask your health care provider what types of exercise are appropriate for you. If you need a cane or walker, use it as recommended by your health care provider. Wear supportive shoes that have nonskid  soles. Safety  Remove any tripping hazards, such as rugs, cords, and clutter. Install safety equipment such as grab bars in bathrooms and safety rails on stairs. Keep rooms and walkways well-lit. General instructions Talk with your health care provider about your risks for falling. Tell your health care provider if: You fall. Be sure to tell your health care provider about all falls, even ones that seem minor. You feel dizzy, tiredness (fatigue), or off-balance. Take over-the-counter and prescription medicines only as told by your health care provider. These include supplements. Eat a healthy diet and maintain a healthy weight. A healthy diet includes low-fat dairy products, low-fat (lean) meats, and fiber from whole grains, beans, and lots of fruits and vegetables. Stay current with your vaccines. Schedule regular health, dental, and eye exams. Summary Having a healthy lifestyle and getting preventive care can help to protect your health and wellness after age 12. Screening and testing are the best way to find a health problem early and help you avoid having a fall. Early diagnosis and treatment give you the best chance for managing medical conditions that are more common for people who are older than age 41. Falls are a major cause of broken bones and head injuries in people who are older than age 66. Take precautions to prevent a fall at home. Work with your health care provider to learn what changes you can make to improve your health and wellness and to prevent falls. This information is not intended to replace advice given to you by your health care provider. Make sure you discuss any questions you have with your health care provider. Document Revised: 07/11/2020 Document Reviewed: 07/11/2020 Elsevier Patient Education  [place].

## 2022-02-20 NOTE — Assessment & Plan Note (Signed)
Appreciate neurology care - she continues namenda and aricept.

## 2022-02-20 NOTE — Assessment & Plan Note (Addendum)
Chronic, stable period on atorvastatin '40mg'$  - continue. Triglyceride levels mildly elevated. The ASCVD Risk score (Arnett DK, et al., 2019) failed to calculate for the following reasons:   The 2019 ASCVD risk score is only valid for ages 80 to 29

## 2022-02-20 NOTE — Assessment & Plan Note (Signed)
Chronic, stable with GFR 47. Will continue to monitor, encouraging good hydration

## 2022-02-20 NOTE — Assessment & Plan Note (Signed)
Chronic, stable on levothyroxine 71mg daily - continue this.

## 2022-02-20 NOTE — Assessment & Plan Note (Signed)
Advanced directive discussion - has this at home. Husband Orpah Greek would be HCPOA. Would not want prolonged life support if terminal condition. Asked to bring [place] a copy.

## 2022-02-20 NOTE — Assessment & Plan Note (Signed)
Reviewed latest DEXA scan.  Continue calcium and vitamin d supplementation.

## 2022-02-20 NOTE — Assessment & Plan Note (Signed)
Preventative protocols reviewed and updated unless pt declined. Discussed healthy diet and lifestyle.  

## 2022-02-20 NOTE — Assessment & Plan Note (Signed)
Encouraged limiting added sugars/sweetened beverages in diet.

## 2022-02-20 NOTE — Assessment & Plan Note (Signed)
Doing well with daily oral replacement.

## 2022-02-20 NOTE — Assessment & Plan Note (Signed)
Chronic, stable period on lexapro '20mg'$  daily. Pt declines med change at this time.

## 2022-02-20 NOTE — Assessment & Plan Note (Signed)
Sees urology for this.

## 2022-02-20 NOTE — Progress Notes (Signed)
Patient ID: Frances Hoffman, female    DOB: 05/12/1940, 81 y.o.   MRN: [id number]  This visit was conducted in person.  BP 116/68   Pulse 81   Temp 97.7 F (36.5 C) (Temporal)   Ht 5' 2.25" (1.581 m)   Wt 179 lb (81.2 kg)   SpO2 94%   BMI 32.48 kg/m    CC: transfer of care from Dr Selena Batten Subjective:   HPI: Frances Hoffman is a 81 y.o. female presenting on 02/20/2022 for Transitions Of Care (TOC from Dr. Selena Batten. Also, here for [facility] prt 2. )   I see patient's husband Frances Hoffman.  Saw health advisor last month for medicare wellness visit. Note reviewed. Failed cognitive assessment.  Sees neurology Dr Terrace Arabia for MCI, on aricept 10mg  daily and namenda 10mg  bid. MRI brain 2019 showed generalized atrophy with mod supratentorial small vessel disease. Overall stable period.   No results found.  Flowsheet Row Clinical Support from 01/24/2022 in [facility]  PHQ-2 Total Score 1          01/24/2022    9:12 AM 06/14/2021    3:20 PM 01/23/2021   10:00 AM 01/21/2020   10:35 AM 01/21/2020   10:14 AM  Fall Risk   Falls in the past year? 0 0 0 0 0  Number falls in past yr: 0 0 0 0 0  Injury with Fall? 0 0 0 0   Risk for fall due to : No Fall Risks No Fall Risks  No Fall Risks   Follow up Falls prevention discussed;Falls evaluation completed Falls evaluation completed Falls prevention discussed Falls evaluation completed    Sees urology [place] for recurrent notes  Intermittent diarrhea, manages with imodium. Rare accidents.  Notes allergic rhinitis - hasn't tried anything for this.   Bruise present to left 1st [facility] for the past 3-4 weeks, denies inciting trauma/injury or falls.   Preventative: Colonoscopy 03/2018 ([facility]ccini) Mammogram 08/2021 - [facility] Well woman exam - age out DEXA scan 02/2021 - T -1.5 RFN  Lung cancer screening - not eligible  Flu shot -yearly COVID shot - 5 vaccines Tdap 2018, 01/2022 Prevnar-13 2016, pneumovax 2010, prevnar-20  11/2021 RSV 10/2021 Shingrix - completed 2021 Advanced directive discussion - has this at home. Husband Frances Hoffman would be HCPOA. Would not want prolonged life support if terminal condition. Asked to bring [place] a copy.  Seat belt use discussed Sunscreen use discussed. No changing moles on skin.  Non smoker Alcohol - none Dentist - q6 mo Eye exam - yearly - has cataracts  Bowel - no constipation Bladder - no incontinence  Lives at home with her husband Frances Hoffman 2 adult sons  - Frances Hoffman and Frances Hoffman 2 Grandchildren  Exercise: treadmill at home  Diet: working on this Enjoys: square dance, eating out with friends, going to church - Sunday and Wednesday     Relevant past medical, surgical, family and social history reviewed and updated as indicated. Interim medical history since our last visit reviewed. Allergies and medications reviewed and updated. Outpatient Medications Prior to Visit  Medication Sig Dispense Refill   acetaminophen (TYLENOL) 500 MG tablet Take 500 mg by mouth every 6 (six) hours as needed.     aspirin-sod bicarb-citric acid (ALKA-SELTZER) 325 MG TBEF tablet Take 325 mg by mouth every 6 (six) hours as needed.     b complex vitamins tablet Take 1 tablet by mouth daily.     calcium carbonate (OS-CAL -  DOSED IN MG OF ELEMENTAL CALCIUM) 1250 (500 CA) MG tablet Take 1 tablet by mouth daily.     Calcium Citrate (CITRACAL PO) Take by mouth.     carboxymethylcellulose (REFRESH PLUS) 0.5 % SOLN Place 1 drop into both eyes 3 (three) times daily as needed (for dryness).     Cholecalciferol (VITAMIN D3) 2000 UNITS capsule Take 2,000 Units by mouth daily.     Cranberry 125 MG TABS Take 2 tablets by mouth.     cyanocobalamin ([employer] VITAMIN B12) 2000 MCG tablet Take 1 tablet (2,000 mcg total) by mouth daily.     donepezil (ARICEPT) 10 MG tablet Take 1 tablet (10 mg total) by mouth at bedtime. 90 tablet 3   fluticasone (FLONASE) 50 MCG/ACT nasal spray 1 spray 2 times daily after sinus rinses- Neil  med or AYR 16 g 6   loperamide (IMODIUM) 1 MG/5ML solution Take by mouth as needed for diarrhea or loose stools. Teaspoons amounts     memantine (NAMENDA) 10 MG tablet Take 1 tablet (10 mg total) by mouth 2 (two) times daily. 180 tablet 3   Multiple Vitamin (MULTIVITAMIN) tablet Take 1 tablet by mouth daily.     Omega-3 Fatty Acids (FISH OIL PO) Take 2 capsules by mouth 2 (two) times daily.     atorvastatin (LIPITOR) 40 MG tablet Take 1 tablet (40 mg total) by mouth daily. For cholesterol. 90 tablet 3   escitalopram (LEXAPRO) 20 MG tablet Take 1 tablet (20 mg total) by mouth daily. 90 tablet 3   levothyroxine (EUTHYROX) 75 MCG tablet Take 1 tablet (75 mcg total) by mouth daily. 90 tablet 3   PFIZER COVID-19 VAC BIVALENT injection      predniSONE (DELTASONE) 20 MG tablet Take 2 tablets (40 mg total) by mouth daily. 6 tablet 0   vitamin C (ASCORBIC ACID) 500 MG tablet Take 500 mg by mouth daily. (Patient not taking: Reported on 01/24/2022)     No facility-administered medications prior to visit.     Per HPI unless specifically indicated in ROS section below Review of Systems  Constitutional:  Negative for activity change, appetite change, chills, fatigue, fever and unexpected weight change.  HENT:  Negative for hearing loss.   Eyes:  Negative for visual disturbance.  Respiratory:  Negative for cough, chest tightness, shortness of breath and wheezing.   Cardiovascular:  Negative for chest pain, palpitations and leg swelling.  Gastrointestinal:  Positive for blood in stool (occ due to hemorrhoids) and diarrhea. Negative for abdominal distention, abdominal pain, constipation, nausea and vomiting.  Genitourinary:  Negative for difficulty urinating and hematuria.  Musculoskeletal:  Negative for arthralgias, myalgias and neck pain.  Skin:  Negative for rash.  Neurological:  Negative for dizziness, seizures, syncope and headaches.  Hematological:  Negative for adenopathy. Does not bruise/bleed  easily.  Psychiatric/Behavioral:  Positive for dysphoric mood. The patient is not nervous/anxious.     Objective:  BP 116/68   Pulse 81   Temp 97.7 F (36.5 C) (Temporal)   Ht 5' 2.25" (1.581 m)   Wt 179 lb (81.2 kg)   SpO2 94%   BMI 32.48 kg/m   Wt Readings from Last 3 Encounters:  02/20/22 179 lb (81.2 kg)  01/24/22 181 lb (82.1 kg)  01/04/22 181 lb (82.1 kg)      Physical Exam Vitals and nursing note reviewed.  Constitutional:      Appearance: Normal appearance. She is obese. She is not ill-appearing.  HENT:  Head: Normocephalic and atraumatic.     Right Ear: Tympanic membrane, ear canal and external ear normal. There is no impacted cerumen.     Left Ear: Tympanic membrane, ear canal and external ear normal. There is no impacted cerumen.     Mouth/Throat:     Comments: Wears mask Eyes:     General:        Right eye: No discharge.        Left eye: No discharge.     Extraocular Movements: Extraocular movements intact.     Conjunctiva/sclera: Conjunctivae normal.     Pupils: Pupils are equal, round, and reactive to light.  Neck:     Thyroid: No thyroid mass or thyromegaly.  Cardiovascular:     Rate and Rhythm: Normal rate and regular rhythm.     Pulses: Normal pulses.     Heart sounds: Normal heart sounds. No murmur heard. Pulmonary:     Effort: Pulmonary effort is normal. No respiratory distress.     Breath sounds: Normal breath sounds. No wheezing, rhonchi or rales.  Abdominal:     General: Bowel sounds are normal. There is no distension.     Palpations: Abdomen is soft. There is no mass.     Tenderness: There is no abdominal tenderness. There is no guarding or rebound.     Hernia: No hernia is present.  Musculoskeletal:     Cervical back: Normal range of motion and neck supple. No rigidity.     Right lower leg: No edema.     Left lower leg: No edema.  Lymphadenopathy:     Cervical: No cervical adenopathy.  Skin:    General: Skin is warm and dry.      Findings: No rash.  Neurological:     General: No focal deficit present.     Mental Status: She is alert. Mental status is at baseline.  Psychiatric:        Mood and Affect: Mood normal.        Behavior: Behavior normal.       Results for orders placed or performed in visit on 02/15/22  Microalbumin / creatinine urine ratio  Result Value Ref Range   Microalb, Ur 0.9 0.0 - 1.9 mg/dL   Creatinine,U 161.0 mg/dL   Microalb Creat Ratio 0.5 0.0 - 30.0 mg/g  CBC with Differential/Platelet  Result Value Ref Range   WBC 7.5 4.0 - 10.5 K/uL   RBC 4.80 3.87 - 5.11 Mil/uL   Hemoglobin 15.1 (H) 12.0 - 15.0 g/dL   HCT 96.0 45.4 - 09.8 %   MCV 93.1 78.0 - 100.0 fl   MCHC 33.8 30.0 - 36.0 g/dL   RDW 11.9 14.7 - 82.9 %   Platelets 288.0 150.0 - 400.0 K/uL   Neutrophils Relative % 60.4 43.0 - 77.0 %   Lymphocytes Relative 28.8 12.0 - 46.0 %   Monocytes Relative 9.4 3.0 - 12.0 %   Eosinophils Relative 0.9 0.0 - 5.0 %   Basophils Relative 0.5 0.0 - 3.0 %   Neutro Abs 4.5 1.4 - 7.7 K/uL   Lymphs Abs 2.2 0.7 - 4.0 K/uL   Monocytes Absolute 0.7 0.1 - 1.0 K/uL   Eosinophils Absolute 0.1 0.0 - 0.7 K/uL   Basophils Absolute 0.0 0.0 - 0.1 K/uL  Hemoglobin A1c  Result Value Ref Range   Hgb A1c MFr Bld 6.1 4.6 - 6.5 %  TSH  Result Value Ref Range   TSH 4.97 0.35 - 5.50 uIU/mL  Comprehensive  metabolic panel  Result Value Ref Range   Sodium 141 135 - 145 mEq/L   Potassium 4.5 3.5 - 5.1 mEq/L   Chloride 103 96 - 112 mEq/L   CO2 32 19 - 32 mEq/L   Glucose, Bld 90 70 - 99 mg/dL   BUN 13 6 - 23 mg/dL   Creatinine, Ser 4.78 0.40 - 1.20 mg/dL   Total Bilirubin 0.8 0.2 - 1.2 mg/dL   Alkaline Phosphatase 97 39 - 117 U/L   AST 19 0 - 37 U/L   ALT 14 0 - 35 U/L   Total Protein 6.8 6.0 - 8.3 g/dL   Albumin 4.1 3.5 - 5.2 g/dL   GFR 29.56 (L) >21.30 mL/min   Calcium 10.1 8.4 - 10.5 mg/dL  Lipid panel  Result Value Ref Range   Cholesterol 172 0 - 200 mg/dL   Triglycerides 865.7 (H) 0.0 - 149.0 mg/dL    HDL 84.69 >62.95 mg/dL   VLDL 28.4 0.0 - 13.2 mg/dL   LDL Cholesterol 91 0 - 99 mg/dL   Total CHOL/HDL Ratio 4    NonHDL 129.34   VITAMIN D 25 Hydroxy (Vit-D Deficiency, Fractures)  Result Value Ref Range   VITD 68.44 30.00 - 100.00 ng/mL  Vitamin B12  Result Value Ref Range   Vitamin B-12 404 211 - 911 pg/mL      01/24/2022    9:10 AM 06/14/2021    3:20 PM 04/19/2021   11:45 AM 01/23/2021   10:02 AM 01/21/2020   11:07 AM  Depression screen PHQ 2/9  Decreased Interest  0 2 0 1  Down, Depressed, Hopeless 1 0 2 0 1  PHQ - 2 Score 1 0 4 0 2  Altered sleeping 0  2  0  Tired, decreased energy 0  2  0  Change in appetite 0  0  0  Feeling bad or failure about yourself  0  2  1  Trouble concentrating 0  2  1  Moving slowly or fidgety/restless 0  0  0  Suicidal thoughts 0  0  0  PHQ-9 Score 1  12  4   Difficult doing work/chores Not difficult at all  Not difficult at all  Not difficult at all    Assessment & Plan:   Problem List Items Addressed This Visit     Vascular dementia without behavioral disturbance (HCC) (Chronic)    Appreciate neurology care - she continues namenda and aricept.       Relevant Medications   escitalopram (LEXAPRO) 20 MG tablet   Health maintenance examination - Primary (Chronic)    Preventative protocols reviewed and updated unless pt declined. Discussed healthy diet and lifestyle.       Advanced directives, counseling/discussion (Chronic)    Advanced directive discussion - has this at home. Husband Frances Hoffman would be HCPOA. Would not want prolonged life support if terminal condition. Asked to bring [place] a copy.       Hypothyroidism    Chronic, stable on levothyroxine daily - continue this.       Relevant Medications   levothyroxine (EUTHYROX) 75 MCG tablet   Hyperlipidemia    Chronic, stable period on atorvastatin 40mg  - continue. Triglyceride levels mildly elevated. The ASCVD Risk score (Arnett DK, et al., 2019) failed to calculate for  the following reasons:   The 2019 ASCVD risk score is only valid for ages 9 to 15       Relevant Medications   atorvastatin (LIPITOR) 40 MG  tablet   Recurrent UTI    Sees urology for this.       Osteopenia    Reviewed latest DEXA scan.  Continue calcium and vitamin d supplementation.       MDD (major depressive disorder), recurrent episode, moderate (HCC)    Chronic, stable period on lexapro 20mg  daily. Pt declines med change at this time.       Relevant Medications   escitalopram (LEXAPRO) 20 MG tablet   Obesity, Class I, BMI 30-34.9    Encouraged healthy diet and lifestyle choices to affect sustainable weight loss.       Mild cognitive impairment    Appreciate neurology care - she continues namenda and aricept.       Prediabetes    Encouraged limiting added sugars/sweetened beverages in diet.       B12 deficiency    Doing well with daily oral replacement.      Allergic rhinitis    Suggested she start otc antihistamine daily.       RESOLVED: Stage 3a chronic kidney disease (HCC)     Meds ordered this encounter  Medications   atorvastatin (LIPITOR) 40 MG tablet    Sig: Take 1 tablet (40 mg total) by mouth daily. For cholesterol.    Dispense:  90 tablet    Refill:  4   escitalopram (LEXAPRO) 20 MG tablet    Sig: Take 1 tablet (20 mg total) by mouth daily.    Dispense:  90 tablet    Refill:  4   levothyroxine (EUTHYROX) 75 MCG tablet    Sig: Take 1 tablet (75 mcg total) by mouth daily.    Dispense:  90 tablet    Refill:  4   No orders of the defined types were placed in this encounter.   Patient instructions: Nice to meet you today!  Bring [place] a copy of your advanced directive to update your chart.  Start allegra or claritin daily - over the counter - for runny nose, use as needed.  Good to see you today Return in 6 months for follow up visit.   4 core lifestyle modifications to support a healthy mind:  1. Nutritious well balance diet.  2. Regular  physical activity routine.  3. Regular mental activity such as reading books, word puzzles, math puzzles, jigsaw puzzles.  4. Social engagement.  Also ensure good blood pressure control, limit alcohol, no smoking.    Follow up plan: Return in about 6 months (around 08/22/2022) for follow up visit.  Eustaquio Boyden, MD

## 2022-02-20 NOTE — Assessment & Plan Note (Signed)
Encouraged healthy diet and lifestyle choices to affect sustainable weight loss.  ?

## 2022-02-20 NOTE — Assessment & Plan Note (Signed)
Suggested she start otc antihistamine daily.

## 2022-02-22 ENCOUNTER — Telehealth: Payer: Self-pay | Admitting: Family Medicine

## 2022-02-22 NOTE — Telephone Encounter (Signed)
Pt called back. I answered all questions to her satisfaction and encouraged pt to dink plenty of water daily to promote good kidney health. Pt expresses her thanks.   Pt also mentioned she has nothing against Dr. Darnell Level but prefers to have a female provider. Pt requests to transfer care to any female provider accepting Dr. Verda Cumins pts.

## 2022-02-22 NOTE — Telephone Encounter (Signed)
Patient called and wanted to talk about her visit on 02/20/2022, about her kidneys. Call back number [phone].

## 2022-02-22 NOTE — Telephone Encounter (Signed)
Lvm rtn pt's call and asked her to call back.

## 2022-02-27 NOTE — Telephone Encounter (Signed)
Noted  

## 2022-03-27 DIAGNOSIS — E039 Hypothyroidism, unspecified: Secondary | ICD-10-CM | POA: Diagnosis not present

## 2022-03-27 DIAGNOSIS — K219 Gastro-esophageal reflux disease without esophagitis: Secondary | ICD-10-CM | POA: Diagnosis not present

## 2022-03-27 DIAGNOSIS — E785 Hyperlipidemia, unspecified: Secondary | ICD-10-CM | POA: Diagnosis not present

## 2022-03-27 DIAGNOSIS — Z853 Personal history of malignant neoplasm of breast: Secondary | ICD-10-CM | POA: Diagnosis not present

## 2022-03-27 DIAGNOSIS — Z008 Encounter for other general examination: Secondary | ICD-10-CM | POA: Diagnosis not present

## 2022-03-27 DIAGNOSIS — R69 Illness, unspecified: Secondary | ICD-10-CM | POA: Diagnosis not present

## 2022-03-27 DIAGNOSIS — Z809 Family history of malignant neoplasm, unspecified: Secondary | ICD-10-CM | POA: Diagnosis not present

## 2022-03-27 DIAGNOSIS — Z6830 Body mass index (BMI) 30.0-30.9, adult: Secondary | ICD-10-CM | POA: Diagnosis not present

## 2022-03-27 DIAGNOSIS — Z8249 Family history of ischemic heart disease and other diseases of the circulatory system: Secondary | ICD-10-CM | POA: Diagnosis not present

## 2022-03-27 DIAGNOSIS — E669 Obesity, unspecified: Secondary | ICD-10-CM | POA: Diagnosis not present

## 2022-03-27 DIAGNOSIS — M1990 Unspecified osteoarthritis, unspecified site: Secondary | ICD-10-CM | POA: Diagnosis not present

## 2022-03-27 DIAGNOSIS — I739 Peripheral vascular disease, unspecified: Secondary | ICD-10-CM | POA: Diagnosis not present

## 2022-04-03 DIAGNOSIS — D485 Neoplasm of uncertain behavior of skin: Secondary | ICD-10-CM | POA: Diagnosis not present

## 2022-04-03 DIAGNOSIS — L309 Dermatitis, unspecified: Secondary | ICD-10-CM | POA: Diagnosis not present

## 2022-04-03 DIAGNOSIS — L578 Other skin changes due to chronic exposure to nonionizing radiation: Secondary | ICD-10-CM | POA: Diagnosis not present

## 2022-04-03 DIAGNOSIS — C4401 Basal cell carcinoma of skin of lip: Secondary | ICD-10-CM | POA: Diagnosis not present

## 2022-04-03 DIAGNOSIS — R202 Paresthesia of skin: Secondary | ICD-10-CM | POA: Diagnosis not present

## 2022-05-10 DIAGNOSIS — M19042 Primary osteoarthritis, left hand: Secondary | ICD-10-CM | POA: Diagnosis not present

## 2022-05-10 DIAGNOSIS — M65331 Trigger finger, right middle finger: Secondary | ICD-10-CM | POA: Diagnosis not present

## 2022-05-18 ENCOUNTER — Telehealth: Payer: Self-pay | Admitting: Family Medicine

## 2022-05-18 NOTE — Telephone Encounter (Signed)
Per pt's chart, rx was sent 02/20/22, #90/4 to [place].   Spoke with pt relaying above info. Encouraged pt to call [facility] and ask them to fill the newer rx. Pt verbalizes understanding and expresses her thanks.

## 2022-05-18 NOTE — Telephone Encounter (Signed)
Prescription Request  05/18/2022  LOV: 02/20/2022  What is the name of the medication or equipment? escitalopram (LEXAPRO) 20 MG tablet   Have you contacted your pharmacy to request a refill? No   Which pharmacy would you like this sent to?  [place], [place] [address] Phone: [phone] Fax: [phone]      Patient notified that their request is being sent to the clinical staff for review and that they should receive a response within 2 business days.   Please advise at [facility] [phone]

## 2022-05-30 DIAGNOSIS — L814 Other melanin hyperpigmentation: Secondary | ICD-10-CM | POA: Diagnosis not present

## 2022-05-30 DIAGNOSIS — L988 Other specified disorders of the skin and subcutaneous tissue: Secondary | ICD-10-CM | POA: Diagnosis not present

## 2022-05-30 DIAGNOSIS — C4402 Squamous cell carcinoma of skin of lip: Secondary | ICD-10-CM | POA: Diagnosis not present

## 2022-05-30 DIAGNOSIS — L578 Other skin changes due to chronic exposure to nonionizing radiation: Secondary | ICD-10-CM | POA: Diagnosis not present

## 2022-06-08 DIAGNOSIS — M65331 Trigger finger, right middle finger: Secondary | ICD-10-CM | POA: Diagnosis not present

## 2022-06-18 DIAGNOSIS — M65331 Trigger finger, right middle finger: Secondary | ICD-10-CM | POA: Diagnosis not present

## 2022-06-19 DIAGNOSIS — H2512 Age-related nuclear cataract, left eye: Secondary | ICD-10-CM | POA: Diagnosis not present

## 2022-07-05 DIAGNOSIS — M65331 Trigger finger, right middle finger: Secondary | ICD-10-CM | POA: Diagnosis not present

## 2022-07-10 DIAGNOSIS — M65331 Trigger finger, right middle finger: Secondary | ICD-10-CM | POA: Diagnosis not present

## 2022-07-13 ENCOUNTER — Other Ambulatory Visit: Payer: Self-pay | Admitting: Family

## 2022-07-13 ENCOUNTER — Other Ambulatory Visit: Payer: Self-pay | Admitting: Family Medicine

## 2022-07-13 DIAGNOSIS — Z0000 Encounter for general adult medical examination without abnormal findings: Secondary | ICD-10-CM

## 2022-07-13 DIAGNOSIS — Z1231 Encounter for screening mammogram for malignant neoplasm of breast: Secondary | ICD-10-CM

## 2022-07-19 DIAGNOSIS — H2512 Age-related nuclear cataract, left eye: Secondary | ICD-10-CM | POA: Diagnosis not present

## 2022-07-25 DIAGNOSIS — Z85828 Personal history of other malignant neoplasm of skin: Secondary | ICD-10-CM | POA: Diagnosis not present

## 2022-07-30 DIAGNOSIS — H2511 Age-related nuclear cataract, right eye: Secondary | ICD-10-CM | POA: Diagnosis not present

## 2022-07-31 DIAGNOSIS — M65331 Trigger finger, right middle finger: Secondary | ICD-10-CM | POA: Diagnosis not present

## 2022-08-02 DIAGNOSIS — H2511 Age-related nuclear cataract, right eye: Secondary | ICD-10-CM | POA: Diagnosis not present

## 2022-08-15 DIAGNOSIS — M65331 Trigger finger, right middle finger: Secondary | ICD-10-CM | POA: Diagnosis not present

## 2022-08-17 ENCOUNTER — Ambulatory Visit: Payer: Medicare HMO | Admitting: Primary Care

## 2022-08-17 ENCOUNTER — Encounter: Payer: Self-pay | Admitting: Primary Care

## 2022-08-17 ENCOUNTER — Ambulatory Visit (INDEPENDENT_AMBULATORY_CARE_PROVIDER_SITE_OTHER): Payer: Medicare HMO | Admitting: Primary Care

## 2022-08-17 ENCOUNTER — Ambulatory Visit
Admission: RE | Admit: 2022-08-17 | Discharge: 2022-08-17 | Disposition: A | Discharge status: Home / Self Care | Payer: Medicare HMO | Source: Ambulatory Visit | Attending: Family | Admitting: Family

## 2022-08-17 VITALS — BP 118/72 | HR 76 | Temp 97.7°F | Ht 62.25 in | Wt 184.0 lb

## 2022-08-17 DIAGNOSIS — R42 Dizziness and giddiness: Secondary | ICD-10-CM | POA: Diagnosis not present

## 2022-08-17 DIAGNOSIS — Z0000 Encounter for general adult medical examination without abnormal findings: Secondary | ICD-10-CM

## 2022-08-17 DIAGNOSIS — Z1231 Encounter for screening mammogram for malignant neoplasm of breast: Secondary | ICD-10-CM | POA: Diagnosis not present

## 2022-08-17 MED ORDER — MECLIZINE HCL 12.5 MG PO TABS: 12.5000 mg | ORAL_TABLET | Freq: Three times a day (TID) | ORAL | 0 refills | Status: DC | PRN

## 2022-08-17 NOTE — Progress Notes (Signed)
Subjective:    Patient ID: Frances Hoffman, female    DOB: 10/29/1940, 82 y.o.   MRN: [id number]  Dizziness Pertinent negatives include no chest pain, nausea, numbness, vomiting or weakness.    Frances Hoffman is a very pleasant 82 y.o. female patient of Tabatha, NP with a history of cerebrovascular disease, hypothyroidism, vascular dementia, osteoarthritis, CKD, vertigo, prediabetes, recurrent UTI who presents today to discuss dizziness.  Symptoms began five mornings ago after getting out of bed. She took a tablet of Dramamine shortly after her symptoms began and her symptoms significantly improved. She woke the following day, felt the same dizziness, took Dramamine with improvement in symptoms. Since then she's continued to notice dizziness each morning that nearly resolves with Dramamine. She has noticed intermittent nausea without vomiting.   She denies vomiting, blurred vision, speech changes, unilateral weakness, chest pain, falls. Her dizziness feels like her prior episodes of vertigo.  She has noticed some ear fullness, particularly on the right side which is chronic.    Review of Systems  Respiratory:  Negative for shortness of breath.   Cardiovascular:  Negative for chest pain.  Gastrointestinal:  Negative for nausea and vomiting.  Neurological:  Positive for dizziness. Negative for speech difficulty, weakness and numbness.         Past Medical History:  Diagnosis Date   Adjustment disorder with mixed anxiety and depressed mood 12/12/2007   Qualifier: Diagnosis of  By: Copland MD, Spencer     Breast cancer (HCC)    Cancer (HCC) 2000   breast had lumpectomy/radiation x36,mammo ,no chemo   Cataract of both eyes    Depression    Dysuria-frequency syndrome    takes AZO   Family history of heart disease 08/27/2013   Frozen shoulder    Gall stones    GERD 12/12/2007   Qualifier: Diagnosis of  By: Copland MD, Spencer     Hyperlipidemia    Hypothyroidism 12/12/2007    Qualifier: Diagnosis of  By: Patsy Lager MD, Spencer     Memory difficulties 09/27/2017   Memory loss    Memory loss 09/22/2014   Osteopenia    BMD 2004, WNL 2008   Personal history of radiation therapy    Pneumonia    Recurrent UTI    Shingles    chronic body pain, left side of body   Shortness of breath dyspnea    when climbing stairs only   Wears glasses     Social History   Socioeconomic History   Marital status: Married    Spouse name: Financial planner    Number of children: 2   Years of education: High school   Highest education level: High school graduate  Occupational History   Not on file  Tobacco Use   Smoking status: Never   Smokeless tobacco: Never  Vaping Use   Vaping Use: Never used  Substance and Sexual Activity   Alcohol use: No    Alcohol/week: 0.0 standard drinks of alcohol   Drug use: No   Sexual activity: Not Currently    Birth control/protection: Post-menopausal  Other Topics Concern   Not on file  Social History Narrative   Lives at home with her husband. [name]   2 adult sons  - [place] and Minerva Areola   2 Grandchildren - both other   Exercise: goes to [employer] sne[employer] - at least 2 times a week   Diet: anything that tastes good, less pepsi's with the tongue   Enjoys: square dance,  eating out with friends, going to church - Sunday and Wednesday   Social Determinants of Health   Financial Resource Strain: Low Risk  (01/24/2022)   Overall Financial Resource Strain (CARDIA)    Difficulty of Paying Living Expenses: Not hard at all  Food Insecurity: No Food Insecurity (01/24/2022)   Hunger Vital Sign    Worried About Running Out of Food in the Last Year: Never true    Ran Out of Food in the Last Year: Never true  Transportation Needs: No Transportation Needs (01/24/2022)   PRAPARE - Administrator, Civil Service (Medical): No    Lack of Transportation (Non-Medical): No  Physical Activity: Insufficiently Active (01/24/2022)   Exercise Vital Sign     Days of Exercise per Week: 3 days    Minutes of Exercise per Session: 30 min  Stress: No Stress Concern Present (01/24/2022)   [employer] of Occupational Health - Occupational Stress Questionnaire    Feeling of Stress : Only a little  Social Connections: Moderately Integrated (01/24/2022)   Social Connection and Isolation Panel [NHANES]    Frequency of Communication with Friends and Family: More than three times a week    Frequency of Social Gatherings with Friends and Family: Once a week    Attends Religious Services: More than 4 times per year    Active Member of [employer] or Organizations: No    Attends Banker Meetings: Never    Marital Status: Married  Catering manager Violence: Not At Risk (01/24/2022)   Humiliation, Afraid, Rape, and Kick questionnaire    Fear of Current or Ex-Partner: No    Emotionally Abused: No    Physically Abused: No    Sexually Abused: No    Past Surgical History:  Procedure Laterality Date   BREAST LUMPECTOMY Left 2000   CHOLECYSTECTOMY N/A 02/21/2015   Procedure: LAPAROSCOPIC CHOLECYSTECTOMY WITH INTRAOPERATIVE CHOLANGIOGRAM;  Surgeon: Gaynelle Adu, MD;  Location: [facility] OR;  Service: General;  Laterality: N/A;   COLONOSCOPY     MULTIPLE TOOTH EXTRACTIONS      Family History  Problem Relation Age of Onset   Osteoporosis Mother    Parkinsonism Father    Breast cancer Sister 35   Breast cancer Other    Coronary artery disease Sister    Ovarian cancer Sister    Heart disease Maternal Grandmother    Heart disease Maternal Grandfather    Heart attack Maternal Grandfather 50   Heart disease Son    Heart attack Son 99    Allergies  Allergen Reactions   Ciprofloxacin Other (See Comments)    Unknown (vertigo??)   Citalopram Hydrobromide Other (See Comments)    Intrusive/ odd thoughts    Flagyl [Metronidazole] Other (See Comments)    Unknown??   Milk-Related Compounds    Oxycodone Other (See Comments)    Headaches    Current  Outpatient Medications on File Prior to Visit  Medication Sig Dispense Refill   acetaminophen (TYLENOL) 500 MG tablet Take 500 mg by mouth every 6 (six) hours as needed.     aspirin-sod bicarb-citric acid (ALKA-SELTZER) 325 MG TBEF tablet Take 325 mg by mouth every 6 (six) hours as needed.     atorvastatin (LIPITOR) 40 MG tablet Take 1 tablet (40 mg total) by mouth daily. For cholesterol. 90 tablet 4   b complex vitamins tablet Take 1 tablet by mouth daily.     calcium carbonate (OS-CAL - DOSED IN MG OF ELEMENTAL CALCIUM) 1250 (500 CA)  MG tablet Take 1 tablet by mouth daily.     Calcium Citrate (CITRACAL PO) Take by mouth.     carboxymethylcellulose (REFRESH PLUS) 0.5 % SOLN Place 1 drop into both eyes 3 (three) times daily as needed (for dryness).     Cholecalciferol (VITAMIN D3) 2000 UNITS capsule Take 2,000 Units by mouth daily.     Cranberry 125 MG TABS Take 2 tablets by mouth.     cyanocobalamin ([employer] VITAMIN B12) 2000 MCG tablet Take 1 tablet (2,000 mcg total) by mouth daily.     donepezil (ARICEPT) 10 MG tablet Take 1 tablet (10 mg total) by mouth at bedtime. 90 tablet 3   escitalopram (LEXAPRO) 20 MG tablet Take 1 tablet (20 mg total) by mouth daily. 90 tablet 4   fluticasone (FLONASE) 50 MCG/ACT nasal spray 1 spray 2 times daily after sinus rinses- Neil med or AYR 16 g 6   levothyroxine (EUTHYROX) 75 MCG tablet Take 1 tablet (75 mcg total) by mouth daily. 90 tablet 4   loperamide (IMODIUM) 1 MG/5ML solution Take by mouth as needed for diarrhea or loose stools. Teaspoons amounts     memantine (NAMENDA) 10 MG tablet Take 1 tablet (10 mg total) by mouth 2 (two) times daily. 180 tablet 3   Multiple Vitamin (MULTIVITAMIN) tablet Take 1 tablet by mouth daily.     Omega-3 Fatty Acids (FISH OIL PO) Take 2 capsules by mouth 2 (two) times daily.     No current facility-administered medications on file prior to visit.    BP 118/72   Pulse 76   Temp 97.7 F (36.5 C) (Temporal)   Ht 5' 2.25"  (1.581 m)   Wt 184 lb (83.5 kg)   SpO2 96%   BMI 33.38 kg/m  Objective:   Physical Exam Eyes:     Extraocular Movements: Extraocular movements intact.  Cardiovascular:     Rate and Rhythm: Normal rate and regular rhythm.  Pulmonary:     Effort: Pulmonary effort is normal.  Musculoskeletal:     Cervical back: Neck supple.  Skin:    General: Skin is warm and dry.  Neurological:     Mental Status: She is alert and oriented to person, place, and time.     Cranial Nerves: No cranial nerve deficit.     Coordination: Romberg sign negative.           Assessment & Plan:  Dizziness Assessment & Plan: Symptoms and presentation today more representative of vertigo. Neuro exam grossly unremarkable.  Suspicion for stroke is low. Reviewed labs from December 2023.  Stop Dramamine.  Start meclizine 12.5 mg every 8 hours as needed.  Drowsiness precautions provided. Also recommended Flonase, 1 spray each nostril twice daily.  Close follow-up with PCP if no improvement.  Orders: -     Meclizine HCl; Take 1 tablet (12.5 mg total) by mouth 3 (three) times daily as needed for dizziness.  Dispense: 30 tablet; Refill: 0        Doreene Nest, NP

## 2022-08-17 NOTE — Patient Instructions (Signed)
Stop taking Dramamine for dizziness.  Start meclizine 12.5 mg for dizziness.  Take 1 tablet by mouth every 8 hours as needed.  This could cause drowsiness.  Start Flonase nasal spray.  Use 1 spray in each nostril twice daily.  Please follow-up with Tabitha if no improvement.  It was a pleasure meeting you!

## 2022-08-17 NOTE — Assessment & Plan Note (Signed)
Symptoms and presentation today more representative of vertigo. Neuro exam grossly unremarkable.  Suspicion for stroke is low. Reviewed labs from December 2023.  Stop Dramamine.  Start meclizine 12.5 mg every 8 hours as needed.  Drowsiness precautions provided. Also recommended Flonase, 1 spray each nostril twice daily.  Close follow-up with PCP if no improvement.

## 2022-08-22 ENCOUNTER — Ambulatory Visit: Payer: Medicare HMO | Admitting: Family Medicine

## 2022-08-22 ENCOUNTER — Ambulatory Visit (INDEPENDENT_AMBULATORY_CARE_PROVIDER_SITE_OTHER): Payer: Medicare HMO | Admitting: Family

## 2022-08-22 ENCOUNTER — Encounter: Payer: Self-pay | Admitting: Family

## 2022-08-22 VITALS — BP 96/60 | HR 81 | Temp 98.6°F | Ht 62.25 in | Wt 182.4 lb

## 2022-08-22 DIAGNOSIS — E7849 Other hyperlipidemia: Secondary | ICD-10-CM | POA: Diagnosis not present

## 2022-08-22 DIAGNOSIS — E538 Deficiency of other specified B group vitamins: Secondary | ICD-10-CM | POA: Diagnosis not present

## 2022-08-22 DIAGNOSIS — M85851 Other specified disorders of bone density and structure, right thigh: Secondary | ICD-10-CM

## 2022-08-22 DIAGNOSIS — Z79899 Other long term (current) drug therapy: Secondary | ICD-10-CM | POA: Diagnosis not present

## 2022-08-22 DIAGNOSIS — F331 Major depressive disorder, recurrent, moderate: Secondary | ICD-10-CM

## 2022-08-22 DIAGNOSIS — E039 Hypothyroidism, unspecified: Secondary | ICD-10-CM

## 2022-08-22 DIAGNOSIS — Z853 Personal history of malignant neoplasm of breast: Secondary | ICD-10-CM

## 2022-08-22 DIAGNOSIS — E669 Obesity, unspecified: Secondary | ICD-10-CM | POA: Diagnosis not present

## 2022-08-22 LAB — COMPREHENSIVE METABOLIC PANEL
ALT: 16 U/L (ref 0–35)
AST: 18 U/L (ref 0–37)
Albumin: 3.9 g/dL (ref 3.5–5.2)
Alkaline Phosphatase: 104 U/L (ref 39–117)
BUN: 18 mg/dL (ref 6–23)
CO2: 25 mEq/L (ref 19–32)
Calcium: 9.4 mg/dL (ref 8.4–10.5)
Chloride: 103 mEq/L (ref 96–112)
Creatinine, Ser: 1.62 mg/dL — ABNORMAL HIGH (ref 0.40–1.20)
GFR: 29.53 mL/min — ABNORMAL LOW (ref >60.00)
Glucose, Bld: 80 mg/dL (ref 70–99)
Potassium: 4.8 mEq/L (ref 3.5–5.1)
Sodium: 137 mEq/L (ref 135–145)
Total Bilirubin: 0.5 mg/dL (ref 0.2–1.2)
Total Protein: 6.7 g/dL (ref 6.0–8.3)

## 2022-08-22 LAB — TSH: TSH: 5.28 u[IU]/mL (ref 0.35–5.50)

## 2022-08-22 NOTE — Assessment & Plan Note (Signed)
Ongoing, improving slightly from last episode with meclizine

## 2022-08-22 NOTE — Assessment & Plan Note (Signed)
Continue levothyroxine 75 mcg once daily  Ordering tsh pending results.  Goal tsh 1-2 as pt with symptoms of weight issue , hard to lose weight

## 2022-08-22 NOTE — Assessment & Plan Note (Signed)
Continue vitamin D and also calcium

## 2022-08-22 NOTE — Assessment & Plan Note (Signed)
Continue otc b12 supplementation

## 2022-08-22 NOTE — Assessment & Plan Note (Addendum)
Stable, continue with lexapro 20 mg once daily

## 2022-08-22 NOTE — Assessment & Plan Note (Signed)
Continue atorvastatin 40 mg nightly  Work on low cholesterol diet and exercise as tolerated

## 2022-08-22 NOTE — Progress Notes (Signed)
Established Patient Office Visit  Subjective:      CC:  Chief Complaint  Patient presents with   Transfer of Care    HPI: Frances Hoffman is a 82 y.o. female presenting on 08/22/2022 for Transfer of Care . Here for toc and follow up  Prior provider Dr. Sharen Hones prior to that was Dr. Selena Batten   New complaints: Seen in office 6/14 for vertigo was given meclizine 12.5 mg bid prn which she states has been helping her with some symptoms.   Hypothyroid: on levothyroxine 75 mcg once daily. Trouble losing weight and does note some increased fatigue.   Wt Readings from Last 3 Encounters:  08/22/22 182 lb 6.4 oz (82.7 kg)  08/17/22 184 lb (83.5 kg)  02/20/22 179 lb (81.2 kg)    Lab Results  Component Value Date   TSH 4.97 02/15/2022         Social history:  Relevant past medical, surgical, family and social history reviewed and updated as indicated. Interim medical history since our last visit reviewed.  Allergies and medications reviewed and updated.  DATA REVIEWED: CHART IN EPIC     ROS: Negative unless specifically indicated above in HPI.    Current Outpatient Medications:    acetaminophen (TYLENOL) 500 MG tablet, Take 500 mg by mouth every 6 (six) hours as needed., Disp: , Rfl:    aspirin-sod bicarb-citric acid (ALKA-SELTZER) 325 MG TBEF tablet, Take 325 mg by mouth every 6 (six) hours as needed., Disp: , Rfl:    atorvastatin (LIPITOR) 40 MG tablet, Take 1 tablet (40 mg total) by mouth daily. For cholesterol., Disp: 90 tablet, Rfl: 4   calcium carbonate (OS-CAL - DOSED IN MG OF ELEMENTAL CALCIUM) 1250 (500 CA) MG tablet, Take 1 tablet by mouth 2 (two) times daily with a meal., Disp: , Rfl:    carboxymethylcellulose (REFRESH PLUS) 0.5 % SOLN, Place 1 drop into both eyes 3 (three) times daily as needed (for dryness)., Disp: , Rfl:    celecoxib (CELEBREX) 200 MG capsule, Take 200 mg by mouth daily., Disp: , Rfl:    Cholecalciferol (VITAMIN D3) 2000 UNITS capsule,  Take 2,000 Units by mouth daily., Disp: , Rfl:    Cranberry 125 MG TABS, Take 2 tablets by mouth., Disp: , Rfl:    cyanocobalamin (CVS VITAMIN B12) 2000 MCG tablet, Take 1 tablet (2,000 mcg total) by mouth daily., Disp: , Rfl:    donepezil (ARICEPT) 10 MG tablet, Take 1 tablet (10 mg total) by mouth at bedtime., Disp: 90 tablet, Rfl: 3   escitalopram (LEXAPRO) 20 MG tablet, Take 1 tablet (20 mg total) by mouth daily., Disp: 90 tablet, Rfl: 4   fluticasone (FLONASE) 50 MCG/ACT nasal spray, 1 spray 2 times daily after sinus rinses- Neil med or AYR, Disp: 16 g, Rfl: 6   levothyroxine (EUTHYROX) 75 MCG tablet, Take 1 tablet (75 mcg total) by mouth daily., Disp: 90 tablet, Rfl: 4   loperamide (IMODIUM) 1 MG/5ML solution, Take by mouth as needed for diarrhea or loose stools. Teaspoons amounts, Disp: , Rfl:    meclizine (ANTIVERT) 12.5 MG tablet, Take 1 tablet (12.5 mg total) by mouth 3 (three) times daily as needed for dizziness., Disp: 30 tablet, Rfl: 0   memantine (NAMENDA) 10 MG tablet, Take 1 tablet (10 mg total) by mouth 2 (two) times daily., Disp: 180 tablet, Rfl: 3   Multiple Vitamin (MULTIVITAMIN) tablet, Take 1 tablet by mouth daily., Disp: , Rfl:    Omega-3 Fatty Acids (  FISH OIL PO), Take 2 capsules by mouth 2 (two) times daily., Disp: , Rfl:       Objective:    BP 96/60   Pulse 81   Temp 98.6 F (37 C) (Oral)   Ht 5' 2.25" (1.581 m)   Wt 182 lb 6.4 oz (82.7 kg)   SpO2 92%   BMI 33.09 kg/m   Wt Readings from Last 3 Encounters:  08/22/22 182 lb 6.4 oz (82.7 kg)  08/17/22 184 lb (83.5 kg)  02/20/22 179 lb (81.2 kg)    Physical Exam Constitutional:      General: She is not in acute distress.    Appearance: Normal appearance. She is obese. She is not ill-appearing, toxic-appearing or diaphoretic.  HENT:     Head: Normocephalic.  Cardiovascular:     Rate and Rhythm: Normal rate and regular rhythm.  Pulmonary:     Effort: Pulmonary effort is normal.     Breath sounds: Normal  breath sounds.  Musculoskeletal:        General: Normal range of motion.  Neurological:     General: No focal deficit present.     Mental Status: She is alert and oriented to person, place, and time. Mental status is at baseline.  Psychiatric:        Mood and Affect: Mood normal.        Behavior: Behavior normal.        Thought Content: Thought content normal.        Judgment: Judgment normal.           Assessment & Plan:  Osteopenia of neck of right femur Assessment & Plan: Continue vitamin D and also calcium    MDD (major depressive disorder), recurrent episode, moderate (HCC) Assessment & Plan: Stable, continue with lexapro 20 mg once daily    Other hyperlipidemia Assessment & Plan: Continue atorvastatin 40 mg nightly  Work on low cholesterol diet and exercise as tolerated   Orders: -     Comprehensive metabolic panel  B12 deficiency Assessment & Plan: Continue otc b12 supplementation    History of breast cancer  On statin therapy -     Comprehensive metabolic panel  Acquired hypothyroidism Assessment & Plan: Continue levothyroxine 75 mcg once daily  Ordering tsh pending results.  Goal tsh 1-2 as pt with symptoms of weight issue , hard to lose weight  Orders: -     TSH  Obesity (BMI 30-39.9) -     Amb Ref to Medical Weight Management     Return in about 6 months (around 02/21/2023) for f/u CPE.  [name], MSN, APRN, FNP-C [facility]

## 2022-08-22 NOTE — Patient Instructions (Signed)
  Check out free app for calorie counting 'myfitnesspal'

## 2022-08-23 ENCOUNTER — Other Ambulatory Visit: Payer: Self-pay | Admitting: Family

## 2022-08-23 DIAGNOSIS — E039 Hypothyroidism, unspecified: Secondary | ICD-10-CM

## 2022-08-23 MED ORDER — LEVOTHYROXINE SODIUM 88 MCG PO TABS
88.0000 ug | ORAL_TABLET | Freq: Every day | ORAL | 3 refills | Status: DC
Start: 2022-08-23 — End: 2023-09-09

## 2022-08-23 NOTE — Progress Notes (Signed)
Is pt having any urinary symptoms? Kidney function has dropped pretty significantly.  If yes can we get her in asap for urine sample?

## 2022-08-23 NOTE — Progress Notes (Signed)
Also thyroid levels a bit higher than desired.  Increase levothyroxine to 88 mcg once daily , sent to pharmacy. Have pt make lab only appt repeat in 6 weeks.

## 2022-08-27 ENCOUNTER — Other Ambulatory Visit: Payer: Self-pay

## 2022-08-27 ENCOUNTER — Telehealth: Payer: Self-pay | Admitting: Family

## 2022-08-27 NOTE — Telephone Encounter (Signed)
Called patient reviewed results also let her know that she can see on My chart. She will call if any further question.

## 2022-08-27 NOTE — Telephone Encounter (Signed)
Pt's husband called asking if Joellen could call the pt back to repeat the lab results to her? Pt seems to have forgot what her & Joellen discussed. Call back # [phone]

## 2022-08-28 ENCOUNTER — Other Ambulatory Visit: Payer: Self-pay | Admitting: Family

## 2022-08-28 DIAGNOSIS — N1831 Chronic kidney disease, stage 3a: Secondary | ICD-10-CM

## 2022-08-28 DIAGNOSIS — N179 Acute kidney failure, unspecified: Secondary | ICD-10-CM

## 2022-08-28 NOTE — Progress Notes (Signed)
Have pt make lab appt this week if able to repeat kidney function because the decrease was significant. Have her drink water throughout the day make sure hydrating. Drop off urine at visit as well.

## 2022-08-29 NOTE — Progress Notes (Signed)
noted 

## 2022-08-30 ENCOUNTER — Other Ambulatory Visit: Payer: Medicare HMO

## 2022-08-31 ENCOUNTER — Other Ambulatory Visit (INDEPENDENT_AMBULATORY_CARE_PROVIDER_SITE_OTHER): Payer: Medicare HMO

## 2022-08-31 DIAGNOSIS — N1831 Chronic kidney disease, stage 3a: Secondary | ICD-10-CM

## 2022-08-31 DIAGNOSIS — R82998 Other abnormal findings in urine: Secondary | ICD-10-CM | POA: Diagnosis not present

## 2022-08-31 DIAGNOSIS — N179 Acute kidney failure, unspecified: Secondary | ICD-10-CM

## 2022-08-31 DIAGNOSIS — E039 Hypothyroidism, unspecified: Secondary | ICD-10-CM

## 2022-08-31 LAB — BASIC METABOLIC PANEL
BUN: 12 mg/dL (ref 6–23)
CO2: 25 mEq/L (ref 19–32)
Calcium: 9.8 mg/dL (ref 8.4–10.5)
Chloride: 105 mEq/L (ref 96–112)
Creatinine, Ser: 1.13 mg/dL (ref 0.40–1.20)
GFR: 45.49 mL/min — ABNORMAL LOW (ref >60.00)
Glucose, Bld: 114 mg/dL — ABNORMAL HIGH (ref 70–99)
Potassium: 4.5 mEq/L (ref 3.5–5.1)
Sodium: 140 mEq/L (ref 135–145)

## 2022-08-31 LAB — TSH: TSH: 5.23 u[IU]/mL (ref 0.35–5.50)

## 2022-09-01 LAB — URINALYSIS W MICROSCOPIC + REFLEX CULTURE
Bilirubin Urine: NEGATIVE
Hgb urine dipstick: NEGATIVE
Hyaline Cast: NONE SEEN /LPF
Ketones, ur: NEGATIVE
Protein, ur: NEGATIVE

## 2022-09-02 LAB — URINALYSIS W MICROSCOPIC + REFLEX CULTURE
Bacteria, UA: NONE SEEN /HPF
Glucose, UA: NEGATIVE
Nitrites, Initial: NEGATIVE
RBC / HPF: NONE SEEN /HPF (ref 0–2)
Specific Gravity, Urine: 1.007 (ref 1.001–1.035)
WBC, UA: NONE SEEN /HPF (ref 0–5)
pH: 6 (ref 5.0–8.0)

## 2022-09-02 LAB — CULTURE INDICATED

## 2022-09-02 LAB — URINE CULTURE
MICRO NUMBER:: 15142262
SPECIMEN QUALITY:: ADEQUATE

## 2022-09-03 ENCOUNTER — Other Ambulatory Visit: Payer: Self-pay | Admitting: Family

## 2022-09-03 DIAGNOSIS — E039 Hypothyroidism, unspecified: Secondary | ICD-10-CM

## 2022-09-19 ENCOUNTER — Encounter (INDEPENDENT_AMBULATORY_CARE_PROVIDER_SITE_OTHER): Payer: Medicare HMO | Admitting: Internal Medicine

## 2022-09-20 DIAGNOSIS — Z0289 Encounter for other administrative examinations: Secondary | ICD-10-CM

## 2022-10-10 ENCOUNTER — Other Ambulatory Visit: Payer: Medicare HMO

## 2022-10-15 ENCOUNTER — Other Ambulatory Visit (INDEPENDENT_AMBULATORY_CARE_PROVIDER_SITE_OTHER): Payer: Medicare HMO

## 2022-10-15 DIAGNOSIS — E039 Hypothyroidism, unspecified: Secondary | ICD-10-CM

## 2022-10-15 LAB — TSH: TSH: 1.07 u[IU]/mL (ref 0.35–5.50)

## 2022-10-30 ENCOUNTER — Ambulatory Visit (INDEPENDENT_AMBULATORY_CARE_PROVIDER_SITE_OTHER): Payer: Medicare HMO | Admitting: Family Medicine

## 2022-10-30 ENCOUNTER — Encounter (INDEPENDENT_AMBULATORY_CARE_PROVIDER_SITE_OTHER): Payer: Self-pay | Admitting: Family Medicine

## 2022-10-30 VITALS — BP 87/66 | HR 86 | Temp 98.2°F | Ht 62.5 in | Wt 180.0 lb

## 2022-10-30 DIAGNOSIS — R5383 Other fatigue: Secondary | ICD-10-CM | POA: Diagnosis not present

## 2022-10-30 DIAGNOSIS — E7849 Other hyperlipidemia: Secondary | ICD-10-CM | POA: Diagnosis not present

## 2022-10-30 DIAGNOSIS — E039 Hypothyroidism, unspecified: Secondary | ICD-10-CM

## 2022-10-30 DIAGNOSIS — Z6832 Body mass index (BMI) 32.0-32.9, adult: Secondary | ICD-10-CM

## 2022-10-30 DIAGNOSIS — E538 Deficiency of other specified B group vitamins: Secondary | ICD-10-CM | POA: Diagnosis not present

## 2022-10-30 DIAGNOSIS — Z1331 Encounter for screening for depression: Secondary | ICD-10-CM

## 2022-10-30 DIAGNOSIS — E669 Obesity, unspecified: Secondary | ICD-10-CM | POA: Diagnosis not present

## 2022-10-30 DIAGNOSIS — R0602 Shortness of breath: Secondary | ICD-10-CM | POA: Diagnosis not present

## 2022-10-30 DIAGNOSIS — F331 Major depressive disorder, recurrent, moderate: Secondary | ICD-10-CM

## 2022-10-30 DIAGNOSIS — E559 Vitamin D deficiency, unspecified: Secondary | ICD-10-CM

## 2022-10-30 DIAGNOSIS — E66811 Obesity, class 1: Secondary | ICD-10-CM

## 2022-10-30 NOTE — Progress Notes (Signed)
Frances Hoffman, D.O.  ABFM, ABOM Specializing in Clinical Bariatric Medicine  Office located at: [address]  [place], [place]  [address]     Bariatric Medicine Visit  Dear Frances Sawyers, FNP   Thank you for referring Frances Hoffman to our clinic today for evaluation.  We performed a consultation to discuss her options for treatment and educate the patient on her disease state.  The following note includes my evaluation and treatment recommendations.   Please do not hesitate to reach out to me directly if you have any further concerns.   Assessment and Plan:   Orders Placed This Encounter  Procedures   CBC with Differential/Platelet   VITAMIN D 25 Hydroxy (Vit-D Deficiency, Fractures)   Folate   Hemoglobin A1c   Insulin, random   Lipid Panel With LDL/HDL Ratio   T4, free   Vitamin B12   T3   EKG 12-Lead    Medications Discontinued During This Encounter  Medication Reason   celecoxib (CELEBREX) 200 MG capsule    Cranberry 125 MG TABS    donepezil (ARICEPT) 10 MG tablet    fluticasone (FLONASE) 50 MCG/ACT nasal spray    meclizine (ANTIVERT) 12.5 MG tablet    loperamide (IMODIUM) 1 MG/5ML solution    Omega-3 Fatty Acids (FISH OIL PO)    Multiple Vitamin (MULTIVITAMIN) tablet     Come fasting for labs 2-3 days prior to next OV & come 30 minutes prior to next OV for IC.  Fatigue Assessment & Plan: Frances Hoffman does feel that her weight is causing her energy to be lower than it should be. Fatigue may be related to obesity, depression or many other causes. she does not appear to have any red flag symptoms and this appears to most likely be related to her current lifestyle habits and dietary intake.  Labs will be ordered and reviewed with her at their next office visit in two weeks.  Epworth sleepiness scale score is 9.  Frances Hoffman denies daytime somnolence and admits to sometimes waking up still tired.  Frances Hoffman generally gets 8 hours of sleep per night, and states that  she has generally restful sleep. Snoring is present. Apneic episodes is not present.   ECG: Performed and reviewed/ interpreted independently.  Normal sinus rhythm, rate 74 bpm; reassuring without any acute abnormalities, will continue to monitor for symptoms   Modified PHQ-9 Depression Screen: Her Food and Mood (modified PHQ-9) score was 8.  In the meanwhile, [name] will focus on self care including making healthy food choices by following their meal plan, improving sleep quality and focusing on stress reduction.  Once we are assured she is on an appropriate meal plan, we will start discussing exercise to increase cardiovascular fitness levels.    Shortness of breath on exertion Assessment & Plan: Frances Hoffman does feel that she gets out of breath more easily than she used to when she exercises and seems to be worsening over time with weight gain.  This has gotten worse recently. Frances Hoffman denies shortness of breath at rest or orthopnea. Frances Hoffman's shortness of breath appears to be obesity related and exercise induced, as they do not appear to have any "red flag" symptoms/ concerns today.  Also, this condition appears to be related to a state of poor cardiovascular conditioning.   Obtain labs today and will be reviewed with her at their next office visit in two weeks.  Indirect Calorimeter was not completed today because pt was non-fasting. Her calculated basal metabolic  rate is 1384.  Patient agreed to work on weight loss at this time.  As Chaylin progresses through our weight loss program, we will gradually increase exercise as tolerated to treat her current condition.   If Frances Hoffman follows our recommendations and loses 5-10% of their weight without improvement of her shortness of breath or if at any time, symptoms become more concerning, they agree to urgently follow up with their PCP/ specialist for further consideration/ evaluation.   Frances Hoffman verbalizes agreement with this plan.    MDD (major  depressive disorder), recurrent episode, moderate (HCC)  Assessment & Plan: On Lexapro 20 mg daily for depression. Denies any SI/HI. She notes sometimes struggling with mood. Does not have a Veterinary surgeon.   C/w Lexapro per Dr.[name]. Recommended [name] to discuss with neurologist at next OV if they feel that obtaining a counselor would be beneficial for her. Reminded patient of the importance of following their prudent nutrition plan and how food can affect mood as well to support emotional wellbeing. We will begin to monitor closely.   Acquired hypothyroidism Assessment & Plan: Hypothyroidism treated with Synthroid 75 mcg daily, tolerating well  Lab Results  Component Value Date   TSH 1.07 10/15/2022   T4TOTAL 10.5 07/01/2008    C/w with Synthroid per PCP. Will begin to monitor condition.    Other hyperlipidemia Assessment & Plan: Reports being on Lipitor 40 mg daily and an unknown dose of Omega 3 Fish Oil, tolerating both well.   Lab Results  Component Value Date   CHOL 172 02/15/2022   HDL 42.80 02/15/2022   LDLCALC 91 02/15/2022   LDLDIRECT 92.0 04/20/2014   TRIG 191.0 (H) 02/15/2022   CHOLHDL 4 02/15/2022   Maintain with Lipitor per Dr.[name]. I recommend Omega 3 Fish Oil ~ 3,600 mg (total) daily. She will begin our treatment plan of a heart-heathy, low cholesterol meal plan. We will begin routine screening.    B12 deficiency Assessment & Plan: Condition treated with OTC Cyanocobalamin 1,000 mcg daily, tolerating well.   Lab Results  Component Value Date   VITAMINB12 404 02/15/2022   Will check B12 levels . C/w OTC B12 at current dose and begin our B12 rich diet.   Vitamin D deficiency Assessment & Plan: Reports having vit D deficiency and takes OTC Vitamin D 2,000 international units daily.   Lab Results  Component Value Date   VD25OH 68.44 02/15/2022   VD25OH 56.7 11/26/2017   VD25OH 47.4 05/30/2017   Will check vit D today. Begin their weight loss  efforts and continue OTC Vitamin D at current dose.   TREATMENT PLAN FOR OBESITY: BMI 32.0-32.9,adult Obesity, Class I, BMI 30-34.9 starting bmi 10/30/22 -32.38 Assessment & Plan: Muscle mass is  94.4 lbs. Fat mass is 81 lbs. Total body water is 65.2 lbs.   Nakeshia will work on healthier eating habits and try their best to follow the Category 1 meal plan.  Behavioral Intervention Additional resources provided today: category 1 meal plan information Evidence-based interventions for health behavior change were utilized today including the discussion of self monitoring techniques, problem-solving barriers and SMART goal setting techniques.   Regarding patient's less desirable eating habits and patterns, we employed the technique of small changes.  Pt will specifically work on: beginning prescribed meal plan for next visit.    FOLLOW UP: Follow up in 2 weeks. She was informed of the importance of frequent follow up visits to maximize her success with intensive lifestyle modifications for her multiple health conditions.  LATREACE BELKO is aware that we will review all of her lab results at our next visit.  She is aware that if anything is critical/ life threatening with the results, we will be contacting her via MyChart prior to the office visit to discuss management.    Chief Complaint:   OBESITY ORTHA MERENESS (MR# [id number]) is a 82 y.o. female who presents for evaluation and treatment of obesity and related comorbidities. Current BMI is Body mass index is 32.4 kg/m. Alexsus has been struggling with her weight for many years and has been unsuccessful in either losing weight, maintaining weight loss, or reaching her healthy weight goal.  BERNADETTE STUNTZ is currently in the action stage of change and ready to dedicate time achieving and maintaining a healthier weight. Aritza is interested in becoming our patient and working on intensive lifestyle modifications including (but not limited to)  diet and exercise for weight loss.  JACKELYNN DORENKAMP is retired. Patient is married to [place]  and has 2 adult children. She lives with [name].  Desires to lose 31 lbs in 6 months.   Exercises 9 minutes daily on an exercise bike.   Has never been on a formal diet plan in the past.   Eats outside the home 2 x a wk & take out once a wk.   Does not skip any meals.   Craves chicken, hamburgers, and ice-cream.   Snacks on peanuts and graham crackers.   Liquid calories: drinks juices & soda  Worst food habits: ice-cream and pepsi   Eats as a reward & when bored.   Subjective:   This is the patient's first visit at [facility].  The patient's NEW PATIENT PACKET that they filled out prior to today's office visit was reviewed at length and information from that paperwork was included within the following office visit note.    Included in the packet: current and past health history, medications, allergies, ROS, gynecologic history (women only), surgical history, family history, social history, weight history, weight loss surgery history (for those that have had weight loss surgery), nutritional evaluation, mood and food questionnaire along with a depression screening (PHQ9) on all patients, an Epworth questionnaire, sleep habits questionnaire, patient life and health improvement goals questionnaire. These will all be scanned into the patient's chart under the "media" tab.   Review of Systems: Please refer to new patient packet scanned into media. Pertinent positives were addressed with patient today.  Reviewed by clinician on day of visit: allergies, medications, problem list, medical history, surgical history, family history, social history, and previous encounter notes.  During the visit, I independently reviewed the patient's EKG, bioimpedance scale results, and indirect calorimeter results. I used this information to tailor a meal plan for the patient that will help MALIAKA SLEVIN to lose weight and will improve her obesity-related conditions going forward.  I performed a medically necessary appropriate examination and/or evaluation. I discussed the assessment and treatment plan with the patient. The patient was provided an opportunity to ask questions and all were answered. The patient agreed with the plan and demonstrated an understanding of the instructions. Labs were ordered today (unless patient declined them) and will be reviewed with the patient at our next visit unless more critical results need to be addressed immediately. Clinical information was updated and documented in the EMR.   Objective:   PHYSICAL EXAM: Blood pressure (!) 87/66, pulse 86, temperature 98.2 F (36.8 C), height 5' 2.5" (1.588  m), weight 180 lb (81.6 kg), SpO2 95%. Body mass index is 32.4 kg/m. General: Well Developed, well nourished, and in no acute distress.  HEENT: Normocephalic, atraumatic Skin: Warm and dry, cap RF less 2 sec, good turgor Chest:  Normal excursion, shape, no gross abn Respiratory: speaking in full sentences, no conversational dyspnea NeuroM-Sk: Ambulates w/o assistance, moves * 4 Psych: A and O *3, insight good, mood-full  Anthropometric Measurements Height: 5' 2.5" (1.588 m) Weight: 180 lb (81.6 kg) BMI (Calculated): 32.38 Weight at Last Visit: na Weight Lost Since Last Visit: na Weight Gained Since Last Visit: na Starting Weight: 180lb Total Weight Loss (lbs): 0 lb (0 kg) Peak Weight: 180lb Waist Measurement : 42 inches   Body Composition  Body Fat %: 44.9 % Fat Mass (lbs): 81 lbs Muscle Mass (lbs): 94.4 lbs Total Body Water (lbs): 65.2 lbs Visceral Fat Rating : 15   Other Clinical Data Fasting: no Labs: yes Today's Visit #: 1 Starting Date: 10/30/22 Comments: first visit    DIAGNOSTIC DATA REVIEWED:  BMET    Component Value Date/Time   NA 140 08/31/2022 0914   NA 144 12/31/2017 1027   K 4.5 08/31/2022 0914   CL 105 08/31/2022  0914   CO2 25 08/31/2022 0914   GLUCOSE 114 (H) 08/31/2022 0914   BUN 12 08/31/2022 0914   BUN 13 12/31/2017 1027   CREATININE 1.13 08/31/2022 0914   CREATININE 0.98 (H) 02/10/2016 1035   CALCIUM 9.8 08/31/2022 0914   GFRNONAA 56 (L) 04/18/2021 0746   GFRNONAA 59 (L) 12/07/2015 0832   GFRAA 67 12/31/2017 1027   GFRAA 68 12/07/2015 0832   Lab Results  Component Value Date   HGBA1C 6.1 02/15/2022   HGBA1C 5.6 04/03/2014   No results found for: "INSULIN" Lab Results  Component Value Date   TSH 1.07 10/15/2022   CBC    Component Value Date/Time   WBC 7.5 02/15/2022 0913   RBC 4.80 02/15/2022 0913   HGB 15.1 (H) 02/15/2022 0913   HGB 14.0 12/31/2017 1027   HCT 44.7 02/15/2022 0913   HCT 41.5 12/31/2017 1027   PLT 288.0 02/15/2022 0913   PLT 296 12/31/2017 1027   MCV 93.1 02/15/2022 0913   MCV 92 12/31/2017 1027   MCH 31.2 04/18/2021 0746   MCHC 33.8 02/15/2022 0913   RDW 14.6 02/15/2022 0913   RDW 13.5 12/31/2017 1027   Iron Studies    Component Value Date/Time   IRON 74 07/04/2016 0000   TIBC 319 07/04/2016 0000   FERRITIN 111.1 08/23/2020 1513   IRONPCTSAT 23 07/04/2016 0000   Lipid Panel     Component Value Date/Time   CHOL 172 02/15/2022 0913   CHOL 175 11/26/2017 0911   TRIG 191.0 (H) 02/15/2022 0913   HDL 42.80 02/15/2022 0913   HDL 46 11/26/2017 0911   CHOLHDL 4 02/15/2022 0913   VLDL 38.2 02/15/2022 0913   LDLCALC 91 02/15/2022 0913   LDLCALC 92 11/26/2017 0911   LDLDIRECT 92.0 04/20/2014 0930   Hepatic Function Panel     Component Value Date/Time   PROT 6.7 08/22/2022 1050   PROT 7.0 12/31/2017 1027   ALBUMIN 3.9 08/22/2022 1050   ALBUMIN 4.2 12/31/2017 1027   AST 18 08/22/2022 1050   ALT 16 08/22/2022 1050   ALKPHOS 104 08/22/2022 1050   BILITOT 0.5 08/22/2022 1050   BILITOT 0.5 12/31/2017 1027   BILIDIR 0.2 04/18/2021 0746   IBILI 0.8 04/18/2021 0746  Component Value Date/Time   TSH 1.07 10/15/2022 0923   Nutritional Lab  Results  Component Value Date   VD25OH 68.44 02/15/2022   VD25OH 56.7 11/26/2017   VD25OH 47.4 05/30/2017    Attestation Statements:   I, Special Puri, acting as a Stage manager for [name], DO., have compiled all relevant documentation for today's office visit on behalf of [name], DO, while in the presence of [employer], DO.  Time spent on visit including pre-visit chart review and post-visit care was estimated to be 60 minutes. Over 50% of the time was spent in direct face to face counseling and coordination of care.  I have reviewed the above documentation for accuracy and completeness, and I agree with the above. Frances Hoffman, D.O.  The 21st Century Cures Act was signed into law in 2016 which includes the topic of electronic health records.  This provides immediate access to information in MyChart.  This includes consultation notes, operative notes, office notes, lab results and pathology reports.  If you have any questions about what you read please let us know at your next visit so we can discuss your concerns and take corrective action if need be.  We are right here with you.

## 2022-11-14 ENCOUNTER — Ambulatory Visit (INDEPENDENT_AMBULATORY_CARE_PROVIDER_SITE_OTHER): Payer: Medicare HMO | Admitting: Family Medicine

## 2022-11-28 ENCOUNTER — Ambulatory Visit (INDEPENDENT_AMBULATORY_CARE_PROVIDER_SITE_OTHER): Payer: Medicare HMO | Admitting: Family Medicine

## 2022-11-28 ENCOUNTER — Encounter (INDEPENDENT_AMBULATORY_CARE_PROVIDER_SITE_OTHER): Payer: Self-pay | Admitting: Family Medicine

## 2022-11-28 VITALS — BP 100/66 | HR 99 | Temp 97.9°F | Ht 62.5 in | Wt 174.0 lb

## 2022-11-28 DIAGNOSIS — Z6831 Body mass index (BMI) 31.0-31.9, adult: Secondary | ICD-10-CM | POA: Diagnosis not present

## 2022-11-28 DIAGNOSIS — R0602 Shortness of breath: Secondary | ICD-10-CM

## 2022-11-28 DIAGNOSIS — E669 Obesity, unspecified: Secondary | ICD-10-CM | POA: Diagnosis not present

## 2022-11-28 DIAGNOSIS — E559 Vitamin D deficiency, unspecified: Secondary | ICD-10-CM | POA: Diagnosis not present

## 2022-11-28 DIAGNOSIS — E039 Hypothyroidism, unspecified: Secondary | ICD-10-CM | POA: Diagnosis not present

## 2022-11-28 DIAGNOSIS — R7303 Prediabetes: Secondary | ICD-10-CM

## 2022-11-28 DIAGNOSIS — Z6832 Body mass index (BMI) 32.0-32.9, adult: Secondary | ICD-10-CM

## 2022-11-28 NOTE — Progress Notes (Signed)
Frances Hoffman, D.O.  ABFM, ABOM Clinical Bariatric Medicine Physician  Office located at: [address] [place], [place]  [address]     Assessment and Plan:   Orders Placed This Encounter  Procedures   COMPLETE METABOLIC PANEL WITH GFR    There are no discontinued medications.   No orders of the defined types were placed in this encounter.   Pt will discuss labs obtained today with Katy at her next visit.   SOB (shortness of breath) on exertion Assessment & Plan: Frances Hoffman does feel that she gets out of breath more easily than she used to when she exercises and seems to be worsening over time with weight gain.  This has gotten worse recently. Frances Hoffman denies shortness of breath at rest or orthopnea. Frances Hoffman shortness of breath appears to be obesity related and exercise induced, as they do not appear to have any "red flag" symptoms/ concerns today.  Also, this condition appears to be related to a state of poor cardiovascular conditioning   Obtain labs today and will be reviewed with her at their next office visit in two weeks.  Indirect Calorimeter completed today to help guide our dietary regimen. It shows a VO2 of 177 and a REE of 1224.  Her calculated basal metabolic rate is 2841 thus her measured basal metabolic rate is worse than expected.  Indirect Calorimeter was not completed at last visit due to Pt non-fasting. On 10/30/22 her calculated basal metabolic rate was 1384.   Patient agreed to work on weight loss at this time.  As Frances Hoffman progresses through our weight loss program, we will gradually increase exercise as tolerated to treat her current condition.   If Frances Hoffman follows our recommendations and loses 5-10% of their weight without improvement of her shortness of breath or if at any time, symptoms become more concerning, they agree to urgently follow up with their PCP/ specialist for further consideration/ evaluation.   Frances Hoffman verbalizes agreement with this plan.     Prediabetes Assessment & Plan: Lab Results  Component Value Date   HGBA1C 5.9 (H) 11/28/2022   HGBA1C 6.1 02/15/2022   HGBA1C 5.9 02/16/2021   INSULIN 12.8 11/28/2022    Frances Hoffman's hunger is overall well controlled. Pt states she is experiencing hunger around meal times, no hunger between meal times. She is currently drinking 24-36 fl oz of water. She notes a positive family history of diabetes (sister).   I advise she increase her protein intake and starting to measure cooked ounces of all meats. I emphasized to Pt that eating on plan, exercising, and drinking plenty water will help speed up her metabolism. We mutually agreed she should aim for drinking at least 8 glasses of water a day. It is my recommendation Frances Hoffman first focus on following her prudent nutritional meal plan prior to discussing the possibility of starting weight loss medications. A1C will be checked today.    Acquired hypothyroidism Assessment & Plan: Lab Results  Component Value Date   TSH 1.07 10/15/2022   Pt is managing her hypothyroidism with synthroid 88 mg daily. Tolerating well with no adverse side effects reported. We reviewed her latest TSH of 1.07 as of 10/15/22, at desired range. She reported no history of MI. We will continue to monitor her levels as it pertains to her weight loss journey. Continue on this regimen per PCP.    Vitamin D deficiency Assessment & Plan: Lab Results  Component Value Date   VD25OH 67.4 11/28/2022  VD25OH 68.44 02/15/2022   VD25OH 56.7 11/26/2017   Taking vitamin D 2000 units daily. Tolerating this well with no significant side effects. We reviewed her latest vitamin D level of 68.44 as of 02/15/22 and discussed the ideal levels with patient. Continue on this regimen. We will continue monitoring her levels as it relates to her weigh loss journey. Vitamin D levels will be checked today.   Obesity, Class I, BMI 30-34.9 starting bmi 10/30/22 -32.38 BMI  32.0-32.9,adult-current BMI 31.3 Assessment & Plan: Frances Hoffman is here to discuss her progress with her obesity treatment plan along with follow-up of her obesity related diagnoses. See Medical Weight Management Flowsheet for complete bioelectrical impedance results.  Condition is docourse: improving.   Since last office visit patient's  Muscle mass has decreased by 2.4 lb. Fat mass has decreased by 3.8 lbs. Total body water has decreased by 2.4 lb.  Counseling done on how various foods will affect these numbers and how to maximize success  Total lbs lost to date: 6 lbs Total weight loss percentage to date: -3.33%  Plan:  Continue to follow the Continue Category 1 meal plan  best they can.    Behavioral Intervention Additional resources provided today: breakfast options and lunch options Evidence-based interventions for health behavior change were utilized today including the discussion of self monitoring techniques, problem-solving barriers and SMART goal setting techniques.   Regarding patient's less desirable eating habits and patterns, we employed the technique of small changes.  Pt will specifically work on: Increasing food intake, incrasing exercising, and drink plenty of water for next visit.   FOLLOW UP: Return in about 1 week (around 12/05/2022). She was informed of the importance of frequent follow up visits to maximize her success with intensive lifestyle modifications for her multiple health conditions.  Subjective:   Chief complaint: Obesity Frances Hoffman is here to discuss her progress with her obesity treatment plan. She is on the the Category 1 Plan and states she is following her eating plan approximately 100% of the time. She states she is walking and using the "exercise machine" 10 minutes 4 days per week, "biking" at least 10 minutes for 4 days per week.   Interval History:  Frances Hoffman is here today for her first follow-up office visit since starting the program  with [place].  Since last office visit she is overall well. Only hungry when first waking up and before lunch time, not between meal times. For breakfast she tends to eat 1 egg and 1 piece of bread. For lunch she has a sandwich with 2 pieces of bread, ham, a diet Dr. Reino Kent, and 4 cookies (24 calories each). Dinner usually consists of salad, sandwiches, chicken, beef, pork (tenderloin), stew beef, or sometimes hamburgers. Doesn't like tuna, fish, or [place]. Doesn't tend to snack between meals. She has a food scale at home, but hasn't started weighing her protein yet. Drinks about 24-36 fl oz of water a day.   All blood work/ lab tests that were recently ordered by myself or an outside provider were reviewed with patient today per their request. Extended time was spent counseling her on all new disease processes that were discovered or preexisting ones that are affected by BMI.  she understands that many of these abnormalities will need to monitored regularly along with the current treatment plan of prudent dietary changes, in which we are making each and every office visit, to improve these health parameters.  Review of Systems:  Pertinent positives were  addressed with patient today.  Reviewed by clinician on day of visit: allergies, medications, problem list, medical history, surgical history, family history, social history, and previous encounter notes.  Weight Summary and Biometrics   Weight Lost Since Last Visit: 6 lb  Weight Gained Since Last Visit: 0   Vitals Temp: 97.9 F (36.6 C) BP: 100/66 Pulse Rate: 99 SpO2: (!) 87 %   Anthropometric Measurements Height: 5' 2.5" (1.588 m) Weight: 174 lb (78.9 kg) BMI (Calculated): 31.3 Weight at Last Visit: 180 lb Weight Lost Since Last Visit: 6 lb Weight Gained Since Last Visit: 0 Starting Weight: 180 lb Total Weight Loss (lbs): 6 lb (2.722 kg) Peak Weight: 180 lb   Body Composition  Body Fat %: 44.3 % Fat Mass (lbs): 77.2 lbs Muscle  Mass (lbs): 92 lbs Total Body Water (lbs): 62.8 lbs Visceral Fat Rating : 14   Other Clinical Data RMR: 1224 Fasting: yes Labs: yes Today's Visit #: 2 Starting Date: 10/30/22     Objective:   PHYSICAL EXAM:  Blood pressure 100/66, pulse 99, temperature 97.9 F (36.6 C), height 5' 2.5" (1.588 m), weight 174 lb (78.9 kg), SpO2 (!) 87%. Body mass index is 31.32 kg/m.  General: Well Developed, well nourished, and in no acute distress.  HEENT: Normocephalic, atraumatic Skin: Warm and dry, cap RF less 2 sec, good turgor Chest:  Normal excursion, shape, no gross abn Respiratory: speaking in full sentences, no conversational dyspnea NeuroM-Sk: Ambulates w/o assistance, moves * 4 Psych: A and O *3, insight good, mood-full  DIAGNOSTIC DATA REVIEWED:  BMET    Component Value Date/Time   NA 140 08/31/2022 0914   NA 144 12/31/2017 1027   K 4.5 08/31/2022 0914   CL 105 08/31/2022 0914   CO2 25 08/31/2022 0914   GLUCOSE 114 (H) 08/31/2022 0914   BUN 12 08/31/2022 0914   BUN 13 12/31/2017 1027   CREATININE 1.13 08/31/2022 0914   CREATININE 0.98 (H) 02/10/2016 1035   CALCIUM 9.8 08/31/2022 0914   GFRNONAA 56 (L) 04/18/2021 0746   GFRNONAA 59 (L) 12/07/2015 0832   GFRAA 67 12/31/2017 1027   GFRAA 68 12/07/2015 0832   Lab Results  Component Value Date   HGBA1C 6.1 02/15/2022   HGBA1C 5.6 04/03/2014   No results found for: "INSULIN" Lab Results  Component Value Date   TSH 1.07 10/15/2022   CBC    Component Value Date/Time   WBC 7.5 02/15/2022 0913   RBC 4.80 02/15/2022 0913   HGB 15.1 (H) 02/15/2022 0913   HGB 14.0 12/31/2017 1027   HCT 44.7 02/15/2022 0913   HCT 41.5 12/31/2017 1027   PLT 288.0 02/15/2022 0913   PLT 296 12/31/2017 1027   MCV 93.1 02/15/2022 0913   MCV 92 12/31/2017 1027   MCH 31.2 04/18/2021 0746   MCHC 33.8 02/15/2022 0913   RDW 14.6 02/15/2022 0913   RDW 13.5 12/31/2017 1027   Iron Studies    Component Value Date/Time   IRON 74  07/04/2016 0000   TIBC 319 07/04/2016 0000   FERRITIN 111.1 08/23/2020 1513   IRONPCTSAT 23 07/04/2016 0000   Lipid Panel     Component Value Date/Time   CHOL 172 02/15/2022 0913   CHOL 175 11/26/2017 0911   TRIG 191.0 (H) 02/15/2022 0913   HDL 42.80 02/15/2022 0913   HDL 46 11/26/2017 0911   CHOLHDL 4 02/15/2022 0913   VLDL 38.2 02/15/2022 0913   LDLCALC 91 02/15/2022 0913   LDLCALC 92 11/26/2017  1610   LDLDIRECT 92.0 04/20/2014 0930   Hepatic Function Panel     Component Value Date/Time   PROT 6.7 08/22/2022 1050   PROT 7.0 12/31/2017 1027   ALBUMIN 3.9 08/22/2022 1050   ALBUMIN 4.2 12/31/2017 1027   AST 18 08/22/2022 1050   ALT 16 08/22/2022 1050   ALKPHOS 104 08/22/2022 1050   BILITOT 0.5 08/22/2022 1050   BILITOT 0.5 12/31/2017 1027   BILIDIR 0.2 04/18/2021 0746   IBILI 0.8 04/18/2021 0746      Component Value Date/Time   TSH 1.07 10/15/2022 0923   Nutritional Lab Results  Component Value Date   VD25OH 68.44 02/15/2022   VD25OH 56.7 11/26/2017   VD25OH 47.4 05/30/2017    Attestations:   Reviewed by clinician on day of visit: allergies, medications, problem list, medical history, surgical history, family history, social history, and previous encounter notes.   Patient was in the office today and time spent on visit including pre-visit chart review and post-visit care/coordination of care and electronic medical record documentation was 40 minutes. 50% of the time was in face to face counseling of this patient's medical condition(s) and providing education on treatment options to include the first-line treatment of diet and lifestyle modification.  I, Isabelle Course, acting as a medical scribe for [name], DO., have compiled all relevant documentation for today's office visit on behalf of [name], DO, while in the presence of [employer], DO.  I have reviewed the above documentation for accuracy and completeness, and I agree with the above.  Frances Hoffman, D.O.  The 21st Century Cures Act was signed into law in 2016 which includes the topic of electronic health records.  This provides immediate access to information in MyChart.  This includes consultation notes, operative notes, office notes, lab results and pathology reports.  If you have any questions about what you read please let us know at your next visit so we can discuss your concerns and take corrective action if need be.  We are right here with you.

## 2022-11-29 LAB — LIPID PANEL WITH LDL/HDL RATIO
Cholesterol, Total: 170 mg/dL (ref 100–199)
HDL: 38 mg/dL — ABNORMAL LOW (ref >39)
LDL Chol Calc (NIH): 103 mg/dL — ABNORMAL HIGH (ref 0–99)
LDL/HDL Ratio: 2.7 ratio (ref 0.0–3.2)
Triglycerides: 165 mg/dL — ABNORMAL HIGH (ref 0–149)
VLDL Cholesterol Cal: 29 mg/dL (ref 5–40)

## 2022-11-29 LAB — COMPREHENSIVE METABOLIC PANEL
ALT: 13 IU/L (ref 0–32)
AST: 23 IU/L (ref 0–40)
Albumin: 4.4 g/dL (ref 3.7–4.7)
Alkaline Phosphatase: 101 IU/L (ref 44–121)
BUN/Creatinine Ratio: 13 (ref 12–28)
BUN: 13 mg/dL (ref 8–27)
Bilirubin Total: 0.8 mg/dL (ref 0.0–1.2)
CO2: 25 mmol/L (ref 20–29)
Calcium: 10.2 mg/dL (ref 8.7–10.3)
Chloride: 101 mmol/L (ref 96–106)
Creatinine, Ser: 1.02 mg/dL — ABNORMAL HIGH (ref 0.57–1.00)
Globulin, Total: 2.6 g/dL (ref 1.5–4.5)
Glucose: 87 mg/dL (ref 70–99)
Potassium: 4.9 mmol/L (ref 3.5–5.2)
Sodium: 142 mmol/L (ref 134–144)
Total Protein: 7 g/dL (ref 6.0–8.5)
eGFR: 55 mL/min/{1.73_m2} — ABNORMAL LOW (ref >59)

## 2022-11-29 LAB — CBC WITH DIFFERENTIAL/PLATELET
Basophils Absolute: 0 10*3/uL (ref 0.0–0.2)
Basos: 0 %
EOS (ABSOLUTE): 0.1 10*3/uL (ref 0.0–0.4)
Eos: 1 %
Hematocrit: 46 % (ref 34.0–46.6)
Hemoglobin: 15.2 g/dL (ref 11.1–15.9)
Immature Grans (Abs): 0 10*3/uL (ref 0.0–0.1)
Immature Granulocytes: 0 %
Lymphocytes Absolute: 2.2 10*3/uL (ref 0.7–3.1)
Lymphs: 27 %
MCH: 31.3 pg (ref 26.6–33.0)
MCHC: 33 g/dL (ref 31.5–35.7)
MCV: 95 fL (ref 79–97)
Monocytes Absolute: 0.8 10*3/uL (ref 0.1–0.9)
Monocytes: 10 %
Neutrophils Absolute: 4.9 10*3/uL (ref 1.4–7.0)
Neutrophils: 62 %
Platelets: 296 10*3/uL (ref 150–450)
RBC: 4.85 x10E6/uL (ref 3.77–5.28)
RDW: 12.9 % (ref 11.7–15.4)
WBC: 8 10*3/uL (ref 3.4–10.8)

## 2022-11-29 LAB — HEMOGLOBIN A1C
Est. average glucose Bld gHb Est-mCnc: 123 mg/dL
Hgb A1c MFr Bld: 5.9 % — ABNORMAL HIGH (ref 4.8–5.6)

## 2022-11-29 LAB — VITAMIN D 25 HYDROXY (VIT D DEFICIENCY, FRACTURES): Vit D, 25-Hydroxy: 67.4 ng/mL (ref 30.0–100.0)

## 2022-11-29 LAB — VITAMIN B12: Vitamin B-12: >2000 pg/mL — ABNORMAL HIGH (ref 232–1245)

## 2022-11-29 LAB — T4, FREE: Free T4: 1.58 ng/dL (ref 0.82–1.77)

## 2022-11-29 LAB — FOLATE: Folate: 10.7 ng/mL (ref >3.0)

## 2022-12-01 LAB — INSULIN, RANDOM: INSULIN: 12.8 u[IU]/mL (ref 2.6–24.9)

## 2022-12-01 LAB — T3: T3, Total: 112 ng/dL (ref 71–180)

## 2022-12-05 ENCOUNTER — Encounter (INDEPENDENT_AMBULATORY_CARE_PROVIDER_SITE_OTHER): Payer: Self-pay | Admitting: Adult Health

## 2022-12-05 ENCOUNTER — Ambulatory Visit (INDEPENDENT_AMBULATORY_CARE_PROVIDER_SITE_OTHER): Payer: Medicare HMO | Admitting: Adult Health

## 2022-12-05 VITALS — BP 93/64 | HR 75 | Temp 98.3°F | Ht 62.5 in | Wt 174.0 lb

## 2022-12-05 DIAGNOSIS — N1831 Chronic kidney disease, stage 3a: Secondary | ICD-10-CM | POA: Diagnosis not present

## 2022-12-05 DIAGNOSIS — E669 Obesity, unspecified: Secondary | ICD-10-CM | POA: Diagnosis not present

## 2022-12-05 DIAGNOSIS — E559 Vitamin D deficiency, unspecified: Secondary | ICD-10-CM | POA: Diagnosis not present

## 2022-12-05 DIAGNOSIS — R7303 Prediabetes: Secondary | ICD-10-CM

## 2022-12-05 DIAGNOSIS — Z6831 Body mass index (BMI) 31.0-31.9, adult: Secondary | ICD-10-CM

## 2022-12-05 DIAGNOSIS — E7849 Other hyperlipidemia: Secondary | ICD-10-CM | POA: Diagnosis not present

## 2022-12-05 DIAGNOSIS — Z6832 Body mass index (BMI) 32.0-32.9, adult: Secondary | ICD-10-CM

## 2022-12-05 DIAGNOSIS — Z0000 Encounter for general adult medical examination without abnormal findings: Secondary | ICD-10-CM

## 2022-12-05 DIAGNOSIS — E039 Hypothyroidism, unspecified: Secondary | ICD-10-CM

## 2022-12-05 NOTE — Progress Notes (Signed)
WEIGHT SUMMARY AND BIOMETRICS  Vitals Temp: 98.3 F (36.8 C) BP: 93/64 Pulse Rate: 75 SpO2: 96 %   Anthropometric Measurements Height: 5' 2.5" (1.588 m) Weight: 174 lb (78.9 kg) BMI (Calculated): 31.3 Weight at Last Visit: 174lb Weight Lost Since Last Visit: 0 Weight Gained Since Last Visit: 0 Starting Weight: 180lb Total Weight Loss (lbs): 6 lb (2.722 kg) Peak Weight: 180lb   Body Composition  Body Fat %: 44 % Fat Mass (lbs): 76.6 lbs Muscle Mass (lbs): 92.6 lbs Total Body Water (lbs): 63.8 lbs Visceral Fat Rating : 14   Other Clinical Data Fasting: no Labs: no Today's Visit #: 3 Starting Date: 10/30/22    Chief Complaint:   OBESITY Frances Hoffman is here to discuss her progress with her obesity treatment plan. She is on the the Category 1 Plan and states she is following her eating plan approximately 90 % of the time. She states she is exercising Stationary Bike 10 minutes 3-4 times per week.   Interim History:  3rd OV at [facility] Have been unable to obtain IC to determine her specific RMR- not fasting  Discussed at length the importance of this test and reviewed what pt needs to do to accomplish.  Exercise-Stationary Bike 10 mins at least 3  x week  Hydration-She estimates to drink 48 oz water/day  Check IC at next OV- pt aware to arrive 30 MINS EARLY AND FASTING  Subjective:   1. Stage 3a chronic kidney disease [facility]) Discussed Labs  Latest Reference Range & Units 08/22/22 10:50 08/31/22 09:14 11/28/22 13:15  Creatinine 0.57 - 1.00 mg/dL 1.61 (H) 0.96 0.45 (H)  (H): Data is abnormally high  estimated creatinine clearance is 41.8 mL/min (A) (by C-G formula based on SCr of 1.02 mg/dL (H)).   Kidney fx slightly improved She is NOT followed by Nephrologist She estimates to drink 48 oz water/day  Per pt- she has had AKI r/t to infections and dehydration. She has never required HD  2. Acquired hypothyroidism Discussed Labs  Latest Reference Range &  Units 11/28/22 13:15  Triiodothyronine (T3) 71 - 180 ng/dL 409  W1,XBJY(NWGNFA) 2.13 - 1.77 ng/dL 0.86   Synthroid 88 mcg  3. Other hyperlipidemia Discussed Labs    Component Value Date/Time   CHOL 170 11/28/2022 1315   TRIG 165 (H) 11/28/2022 1315   HDL 38 (L) 11/28/2022 1315   CHOLHDL 4 02/15/2022 0913   VLDL 38.2 02/15/2022 0913   LDLCALC 103 (H) 11/28/2022 1315   LDLDIRECT 92.0 04/20/2014 0930   LABVLDL 29 11/28/2022 1315  The ASCVD Risk score (Arnett DK, et al., 2019) failed to calculate for the following reasons:   The 2019 ASCVD risk score is only valid for ages 50 to 41  PCP manages daily Lipitor 40mg   4. Healthcare maintenance Discussed Labs    11/28/2022 CBC with diff- WNL                    Folate Level- WNL                    B12 Level- over replaced  5. Vitamin D deficiency Discussed Labs  Latest Reference Range & Units 11/28/22 13:15  Vitamin D, 25-Hydroxy 30.0 - 100.0 ng/mL 67.4   She is taking OTC Vit D 3 5,000 iu daily  6. Prediabetes Discussed Labs  Latest Reference Range & Units 11/28/22 13:15  Glucose 70 - 99 mg/dL 87  Hemoglobin V7Q 4.8 - 5.6 %  5.9 (H)  Est. average glucose Bld gHb Est-mCnc mg/dL 161  INSULIN 2.6 - 09.6 uIU/mL 12.8  (H): Data is abnormally high Lab Results  Component Value Date   HGBA1C 5.9 (H) 11/28/2022   HGBA1C 6.1 02/15/2022   HGBA1C 5.9 02/16/2021    She is not currently on any antidiabetic medications  Assessment/Plan:   1. Stage 3a chronic kidney disease (HCC) Increase water intake Continue to avoid Nephrotoxic substances  2. Acquired hypothyroidism Continue Synthroid 88 mcg  3. Other hyperlipidemia Continue daily statin therapy per PCP  4. Healthcare maintenance Reduce B12 supplemenation  5. Vitamin D deficiency Continue daily OTC Supplementation  6. Prediabetes Continue Cat 1 meal plan and increase daily activity  7. BMI 32.0-32.9,adult-current BMI 31.3  [name] is currently in the action stage  of change. As such, her goal is to continue with weight loss efforts. She has agreed to the Category 1 Plan.   Exercise goals: Older adults should follow the adult guidelines. When older adults cannot meet the adult guidelines, they should be as physically active as their abilities and conditions will allow.  Older adults should do exercises that maintain or improve balance if they are at risk of falling.  Older adults should determine their level of effort for physical activity relative to their level of fitness.  Older adults with chronic conditions should understand whether and how their conditions affect their ability to do regular physical activity safely.  Behavioral modification strategies: increasing lean protein intake, decreasing simple carbohydrates, increasing vegetables, increasing water intake, no skipping meals, meal planning and cooking strategies, and planning for success.  Frances Hoffman has agreed to follow-up with our clinic in 2 weeks. She was informed of the importance of frequent follow-up visits to maximize her success with intensive lifestyle modifications for her multiple health conditions.   Check IC at next OV- pt aware to arrive 30 MINS EARLY AND FASTING  Objective:   Blood pressure 93/64, pulse 75, temperature 98.3 F (36.8 C), height 5' 2.5" (1.588 m), weight 174 lb (78.9 kg), SpO2 96%. Body mass index is 31.32 kg/m.  General: Cooperative, alert, well developed, in no acute distress. HEENT: Conjunctivae and lids unremarkable. Cardiovascular: Regular rhythm.  Lungs: Normal work of breathing. Neurologic: No focal deficits.   Lab Results  Component Value Date   CREATININE 1.02 (H) 11/28/2022   BUN 13 11/28/2022   NA 142 11/28/2022   K 4.9 11/28/2022   CL 101 11/28/2022   CO2 25 11/28/2022   Lab Results  Component Value Date   ALT 13 11/28/2022   AST 23 11/28/2022   ALKPHOS 101 11/28/2022   BILITOT 0.8 11/28/2022   Lab Results  Component Value Date    HGBA1C 5.9 (H) 11/28/2022   HGBA1C 6.1 02/15/2022   HGBA1C 5.9 02/16/2021   HGBA1C 5.4 01/21/2020   HGBA1C 5.7 01/19/2019   Lab Results  Component Value Date   INSULIN 12.8 11/28/2022   Lab Results  Component Value Date   TSH 1.07 10/15/2022   Lab Results  Component Value Date   CHOL 170 11/28/2022   HDL 38 (L) 11/28/2022   LDLCALC 103 (H) 11/28/2022   LDLDIRECT 92.0 04/20/2014   TRIG 165 (H) 11/28/2022   CHOLHDL 4 02/15/2022   Lab Results  Component Value Date   VD25OH 67.4 11/28/2022   VD25OH 68.44 02/15/2022   VD25OH 56.7 11/26/2017   Lab Results  Component Value Date   WBC 8.0 11/28/2022   HGB 15.2 11/28/2022   HCT  46.0 11/28/2022   MCV 95 11/28/2022   PLT 296 11/28/2022   Lab Results  Component Value Date   IRON 74 07/04/2016   TIBC 319 07/04/2016   FERRITIN 111.1 08/23/2020    Attestation Statements:   Reviewed by clinician on day of visit: allergies, medications, problem list, medical history, surgical history, family history, social history, and previous encounter notes.  I have reviewed the above documentation for accuracy and completeness, and I agree with the above. -  Yareth Kearse d. Daelyn Pettaway, NP-C

## 2022-12-14 ENCOUNTER — Telehealth: Payer: Self-pay | Admitting: Family

## 2022-12-14 NOTE — Telephone Encounter (Signed)
Patient was wondering if it would be beneficial for her to get the vaccine for RSV. States she is over 48 and wants to know if this would be a good option for her. Please advise, thanks!

## 2022-12-17 NOTE — Telephone Encounter (Signed)
LM for pt to returncall

## 2022-12-17 NOTE — Telephone Encounter (Signed)
Patient returned call, advised of message from below. Patient understood and has no further questions at this time.

## 2022-12-17 NOTE — Telephone Encounter (Signed)
This would be a good vaccine to consider.  RSV affects the old and the very young, so any antibodies would be good with RSV.

## 2022-12-20 ENCOUNTER — Encounter (INDEPENDENT_AMBULATORY_CARE_PROVIDER_SITE_OTHER): Payer: Self-pay | Admitting: Family Medicine

## 2022-12-20 ENCOUNTER — Ambulatory Visit (INDEPENDENT_AMBULATORY_CARE_PROVIDER_SITE_OTHER): Payer: Medicare HMO | Admitting: Family Medicine

## 2022-12-20 VITALS — BP 86/57 | HR 82 | Temp 98.2°F | Ht 62.5 in | Wt 173.0 lb

## 2022-12-20 DIAGNOSIS — R7303 Prediabetes: Secondary | ICD-10-CM

## 2022-12-20 DIAGNOSIS — Z6831 Body mass index (BMI) 31.0-31.9, adult: Secondary | ICD-10-CM | POA: Diagnosis not present

## 2022-12-20 DIAGNOSIS — E669 Obesity, unspecified: Secondary | ICD-10-CM | POA: Insufficient documentation

## 2022-12-20 DIAGNOSIS — E7849 Other hyperlipidemia: Secondary | ICD-10-CM

## 2022-12-20 DIAGNOSIS — Z6832 Body mass index (BMI) 32.0-32.9, adult: Secondary | ICD-10-CM | POA: Insufficient documentation

## 2022-12-20 MED ORDER — METFORMIN HCL 500 MG PO TABS: 500.0000 mg | ORAL_TABLET | Freq: Every day | ORAL | 0 refills | Status: DC

## 2022-12-20 NOTE — Progress Notes (Signed)
Chief Complaint:   OBESITY Frances Hoffman is here to discuss her progress with her obesity treatment plan along with follow-up of her obesity related diagnoses. Frances Hoffman is on the Category 1 Plan and states she is following her eating plan approximately 75% of the time. Frances Hoffman states she is riding the stationary bike for 10 minutes 4 times per week.  Today's visit was #: 4 Starting weight: 180 lbs Starting date: 10/30/2022 Today's weight: 173 lbs Today's date: 12/20/2022 Total lbs lost to date: 7 Total lbs lost since last in-office visit: 1  Interim History: Patient has been working on portion control and making smarter food choices over the last 2 weeks and has started to increase using her exercise bike.  She has still lost weight but it appears that she is not meeting her nutritional goals, which will likely decrease her RMR soon if it has not done so already.  Subjective:   1. Prediabetes Patient is working on her diet but she is struggling at times.  Her A1c was elevated at 5.9 and she notes some polyphagia.  2. Other hyperlipidemia Patient is working on her diet and weight loss to help decrease cholesterol.  She is due to have labs rechecked to monitor her progress.  Assessment/Plan:   1. Prediabetes We will check labs today.  Patient agreed to start metformin 500 mg every morning with no refills.  We will follow-up at her next visit.  - CMP14+EGFR - Hemoglobin A1c - Insulin, random - metFORMIN (GLUCOPHAGE) 500 MG tablet; Take 1 tablet (500 mg total) by mouth daily with breakfast.  Dispense: 30 tablet; Refill: 0  2. Other hyperlipidemia We will check labs today.  Patient will continue with her diet, exercise, and weight loss.  3. BMI 31.0-31.9,adult  4. Obesity, Beginning BMI 32.40 Frances Hoffman is currently in the action stage of change. As such, her goal is to continue with weight loss efforts. She has agreed to change to keeping a food journal and adhering to recommended  goals of 1000-1200 calories and 75+ grams of protein daily.   I explained details of keeping a food journal in depth with the patient today.   Exercise goals: As is.   Behavioral modification strategies: keeping a strict food journal.  Frances Hoffman has agreed to follow-up with our clinic in 2 to 3 weeks. She was informed of the importance of frequent follow-up visits to maximize her success with intensive lifestyle modifications for her multiple health conditions.   Objective:   Blood pressure (!) 86/57, pulse 82, temperature 98.2 F (36.8 C), height 5' 2.5" (1.588 m), weight 173 lb (78.5 kg), SpO2 94%. Body mass index is 31.14 kg/m.  Lab Results  Component Value Date   CREATININE 1.02 (H) 11/28/2022   BUN 13 11/28/2022   NA 142 11/28/2022   K 4.9 11/28/2022   CL 101 11/28/2022   CO2 25 11/28/2022   Lab Results  Component Value Date   ALT 13 11/28/2022   AST 23 11/28/2022   ALKPHOS 101 11/28/2022   BILITOT 0.8 11/28/2022   Lab Results  Component Value Date   HGBA1C 5.9 (H) 11/28/2022   HGBA1C 6.1 02/15/2022   HGBA1C 5.9 02/16/2021   HGBA1C 5.4 01/21/2020   HGBA1C 5.7 01/19/2019   Lab Results  Component Value Date   INSULIN 12.8 11/28/2022   Lab Results  Component Value Date   TSH 1.07 10/15/2022   Lab Results  Component Value Date   CHOL 170 11/28/2022   HDL  38 (L) 11/28/2022   LDLCALC 103 (H) 11/28/2022   LDLDIRECT 92.0 04/20/2014   TRIG 165 (H) 11/28/2022   CHOLHDL 4 02/15/2022   Lab Results  Component Value Date   VD25OH 67.4 11/28/2022   VD25OH 68.44 02/15/2022   VD25OH 56.7 11/26/2017   Lab Results  Component Value Date   WBC 8.0 11/28/2022   HGB 15.2 11/28/2022   HCT 46.0 11/28/2022   MCV 95 11/28/2022   PLT 296 11/28/2022   Lab Results  Component Value Date   IRON 74 07/04/2016   TIBC 319 07/04/2016   FERRITIN 111.1 08/23/2020   Attestation Statements:   Reviewed by clinician on day of visit: allergies, medications, problem list,  medical history, surgical history, family history, social history, and previous encounter notes.  Time spent on visit including pre-visit chart review and post-visit care and charting was 40 minutes.   I, Burt Knack, am acting as transcriptionist for Quillian Quince, MD.  I have reviewed the above documentation for accuracy and completeness, and I agree with the above. -  Quillian Quince, MD

## 2022-12-21 LAB — HEMOGLOBIN A1C
Est. average glucose Bld gHb Est-mCnc: 120 mg/dL
Hgb A1c MFr Bld: 5.8 % — ABNORMAL HIGH (ref 4.8–5.6)

## 2022-12-21 LAB — CMP14+EGFR
ALT: 14 [IU]/L (ref 0–32)
AST: 20 [IU]/L (ref 0–40)
Albumin: 4.1 g/dL (ref 3.7–4.7)
Alkaline Phosphatase: 108 [IU]/L (ref 44–121)
BUN/Creatinine Ratio: 12 (ref 12–28)
BUN: 12 mg/dL (ref 8–27)
Bilirubin Total: 0.7 mg/dL (ref 0.0–1.2)
CO2: 22 mmol/L (ref 20–29)
Calcium: 9.4 mg/dL (ref 8.7–10.3)
Chloride: 104 mmol/L (ref 96–106)
Creatinine, Ser: 1.04 mg/dL — ABNORMAL HIGH (ref 0.57–1.00)
Globulin, Total: 2.6 g/dL (ref 1.5–4.5)
Glucose: 85 mg/dL (ref 70–99)
Potassium: 4.5 mmol/L (ref 3.5–5.2)
Sodium: 144 mmol/L (ref 134–144)
Total Protein: 6.7 g/dL (ref 6.0–8.5)
eGFR: 54 mL/min/{1.73_m2} — ABNORMAL LOW (ref >59)

## 2022-12-21 LAB — INSULIN, RANDOM: INSULIN: 18.7 u[IU]/mL (ref 2.6–24.9)

## 2022-12-26 ENCOUNTER — Telehealth (INDEPENDENT_AMBULATORY_CARE_PROVIDER_SITE_OTHER): Payer: Self-pay | Admitting: Family Medicine

## 2022-12-26 NOTE — Telephone Encounter (Signed)
Pt called stating that she is having bad diarrhea as a result of taking the Metformin that was prescribed to her. Pt would like a call back from a member of our medical staff as soon as possible. [facility].

## 2022-12-27 ENCOUNTER — Telehealth (INDEPENDENT_AMBULATORY_CARE_PROVIDER_SITE_OTHER): Payer: Self-pay

## 2022-12-27 NOTE — Telephone Encounter (Signed)
Spoke to Dr Val Eagle, who advised patient to stay off the Metformin. F/U moved up to 10/29 with Dr Dalbert Garnet. Per Dr Kary Kos request. As patient had seen Dr Val Eagle with primary care. Concern patient will confuse her with primary care provider.

## 2022-12-27 NOTE — Telephone Encounter (Signed)
Called pt regarding severe diarrhea. Pt reported taking one dose of Metformin on Oct 14th. Had diarrhea following. Took nothing for it. Stopped taking Metformin. Diarrhea resolved on its own. Has not taken Metformin since. Has f/u appt on 10/31.

## 2022-12-31 ENCOUNTER — Ambulatory Visit (INDEPENDENT_AMBULATORY_CARE_PROVIDER_SITE_OTHER): Payer: Medicare HMO | Admitting: Internal Medicine

## 2022-12-31 ENCOUNTER — Encounter: Payer: Self-pay | Admitting: Internal Medicine

## 2022-12-31 VITALS — BP 96/70 | HR 72 | Temp 98.0°F | Ht 62.5 in | Wt 177.0 lb

## 2022-12-31 DIAGNOSIS — N3000 Acute cystitis without hematuria: Secondary | ICD-10-CM | POA: Diagnosis not present

## 2022-12-31 DIAGNOSIS — R300 Dysuria: Secondary | ICD-10-CM | POA: Diagnosis not present

## 2022-12-31 LAB — POC URINALSYSI DIPSTICK (AUTOMATED)
Bilirubin, UA: NEGATIVE
Glucose, UA: NEGATIVE
Ketones, UA: NEGATIVE
Nitrite, UA: POSITIVE
Protein, UA: POSITIVE — AB
Spec Grav, UA: 1.015 (ref 1.010–1.025)
Urobilinogen, UA: 0.2 U/dL
pH, UA: 6 (ref 5.0–8.0)

## 2022-12-31 MED ORDER — SULFAMETHOXAZOLE-TRIMETHOPRIM 800-160 MG PO TABS: 1.0000 | ORAL_TABLET | Freq: Two times a day (BID) | ORAL | 1 refills | Status: DC

## 2022-12-31 NOTE — Progress Notes (Signed)
Subjective:    Patient ID: Frances Hoffman, female    DOB: 1941/02/28, 82 y.o.   MRN: [id number]  HPI Here due to urinary symptoms  Started 2 days ago Burning dysuria and shaking when peeing No blood Went to urgent care and couldn't be seen Tried azo--some help  Some lower abdominal pain  Current Outpatient Medications on File Prior to Visit  Medication Sig Dispense Refill   acetaminophen (TYLENOL) 500 MG tablet Take 500 mg by mouth every 6 (six) hours as needed.     aspirin-sod bicarb-citric acid (ALKA-SELTZER) 325 MG TBEF tablet Take 325 mg by mouth every 6 (six) hours as needed.     atorvastatin (LIPITOR) 40 MG tablet Take 1 tablet (40 mg total) by mouth daily. For cholesterol. 90 tablet 4   calcium carbonate (OS-CAL - DOSED IN MG OF ELEMENTAL CALCIUM) 1250 (500 CA) MG tablet Take 1 tablet by mouth 2 (two) times daily with a meal.     carboxymethylcellulose (REFRESH PLUS) 0.5 % SOLN Place 1 drop into both eyes 3 (three) times daily as needed (for dryness).     Cholecalciferol (VITAMIN D3) 2000 UNITS capsule Take 2,000 Units by mouth daily.     Cinnamon 500 MG TABS Take 500 mg by mouth daily.     cyanocobalamin ([employer] VITAMIN B12) 2000 MCG tablet Take 1 tablet (2,000 mcg total) by mouth daily.     escitalopram (LEXAPRO) 20 MG tablet Take 1 tablet (20 mg total) by mouth daily. 90 tablet 4   levothyroxine (SYNTHROID) 88 MCG tablet Take 1 tablet (88 mcg total) by mouth daily. 90 tablet 3   memantine (NAMENDA) 10 MG tablet Take 1 tablet (10 mg total) by mouth 2 (two) times daily. 180 tablet 3   metFORMIN (GLUCOPHAGE) 500 MG tablet Take 1 tablet (500 mg total) by mouth daily with breakfast. 30 tablet 0   METHYLCELLULOSE OP Take 500 mg by mouth daily.     omeprazole (PRILOSEC) 20 MG capsule Take 20 mg by mouth daily.     No current facility-administered medications on file prior to visit.    Allergies  Allergen Reactions   Ciprofloxacin Other (See Comments)    Unknown (vertigo??)    Citalopram Hydrobromide Other (See Comments)    Intrusive/ odd thoughts    Flagyl [Metronidazole] Other (See Comments)    Unknown??   Milk-Related Compounds    Oxycodone Other (See Comments)    Headaches    Past Medical History:  Diagnosis Date   Adjustment disorder with mixed anxiety and depressed mood 12/12/2007   Qualifier: Diagnosis of  By: Copland MD, Spencer     B12 deficiency    Breast cancer (HCC)    Cancer (HCC) 2000   breast had lumpectomy/radiation x36,mammo ,no chemo   Cataract of both eyes    Depression    Dysuria-frequency syndrome    takes AZO   Family history of heart disease 08/27/2013   Frozen shoulder    Gall stones    GERD 12/12/2007   Qualifier: Diagnosis of  By: Copland MD, Spencer     Hyperlipidemia    Hypothyroidism 12/12/2007   Qualifier: Diagnosis of  By: Patsy Lager MD, Spencer     Lactose intolerance    Memory difficulties 09/27/2017   Memory loss    Memory loss 09/22/2014   Osteopenia    BMD 2004, WNL 2008   Personal history of radiation therapy    Pneumonia    Recurrent UTI    Shingles  chronic body pain, left side of body   Shortness of breath dyspnea    when climbing stairs only   Vitamin D deficiency    Wears glasses     Past Surgical History:  Procedure Laterality Date   BREAST LUMPECTOMY Left 2000   CHOLECYSTECTOMY N/A 02/21/2015   Procedure: LAPAROSCOPIC CHOLECYSTECTOMY WITH INTRAOPERATIVE CHOLANGIOGRAM;  Surgeon: Gaynelle Adu, MD;  Location: [facility] OR;  Service: General;  Laterality: N/A;   COLONOSCOPY     MULTIPLE TOOTH EXTRACTIONS      Family History  Problem Relation Age of Onset   Osteoporosis Mother    Hypertension Mother    Sleep apnea Mother    Obesity Mother    Parkinsonism Father    Breast cancer Sister 28   Coronary artery disease Sister    Ovarian cancer Sister    Heart disease Son    Heart attack Son 68   Heart disease Maternal Grandmother    Heart disease Maternal Grandfather    Heart attack Maternal  Grandfather 37   Breast cancer Other     Social History   Socioeconomic History   Marital status: Married    Spouse name: Financial planner    Number of children: 2   Years of education: High school   Highest education level: High school graduate  Occupational History   Not on file  Tobacco Use   Smoking status: Never   Smokeless tobacco: Never  Vaping Use   Vaping status: Never Used  Substance and Sexual Activity   Alcohol use: No    Alcohol/week: 0.0 standard drinks of alcohol   Drug use: No   Sexual activity: Yes    Partners: Male    Birth control/protection: Post-menopausal  Other Topics Concern   Not on file  Social History Narrative   Lives at home with her husband. Frances Hoffman   2 adult sons  - [place] and Minerva Areola   2 Grandchildren - both other   Exercise: goes to [employer] - at least 2 times a week   Diet: anything that tastes good, less pepsi's with the tongue   Enjoys: square dance, eating out with friends, going to church - Sunday and Wednesday   Social Determinants of Health   Financial Resource Strain: Low Risk  (01/24/2022)   Overall Financial Resource Strain (CARDIA)    Difficulty of Paying Living Expenses: Not hard at all  Food Insecurity: No Food Insecurity (01/24/2022)   Hunger Vital Sign    Worried About Running Out of Food in the Last Year: Never true    Ran Out of Food in the Last Year: Never true  Transportation Needs: No Transportation Needs (01/24/2022)   PRAPARE - Administrator, Civil Service (Medical): No    Lack of Transportation (Non-Medical): No  Physical Activity: Insufficiently Active (01/24/2022)   Exercise Vital Sign    Days of Exercise per Week: 3 days    Minutes of Exercise per Session: 30 min  Stress: No Stress Concern Present (01/24/2022)   [employer] of Occupational Health - Occupational Stress Questionnaire    Feeling of Stress : Only a little  Social Connections: Moderately Integrated (01/24/2022)   Social  Connection and Isolation Panel [NHANES]    Frequency of Communication with Friends and Family: More than three times a week    Frequency of Social Gatherings with Friends and Family: Once a week    Attends Religious Services: More than 4 times per year    Active Member  of Clubs or Organizations: No    Attends Banker Meetings: Never    Marital Status: Married  Catering manager Violence: Not At Risk (01/24/2022)   Humiliation, Afraid, Rape, and Kick questionnaire    Fear of Current or Ex-Partner: No    Emotionally Abused: No    Physically Abused: No    Sexually Abused: No   Review of Systems No N/V No back pain Eating okay    Objective:   Physical Exam Constitutional:      Appearance: Normal appearance.  Abdominal:     Palpations: Abdomen is soft.     Tenderness: There is no abdominal tenderness. There is no right CVA tenderness, left CVA tenderness or guarding.  Neurological:     Mental Status: She is alert.            Assessment & Plan:

## 2022-12-31 NOTE — Assessment & Plan Note (Signed)
Typical symptoms with 3+ leuks/blood and + nitrites in urine Will treat with septra DS bid x 3 days (but give refill)

## 2022-12-31 NOTE — Addendum Note (Signed)
Addended by: Eual Fines on: 12/31/2022 12:22 PM   Modules accepted: Orders

## 2023-01-01 ENCOUNTER — Ambulatory Visit (INDEPENDENT_AMBULATORY_CARE_PROVIDER_SITE_OTHER): Payer: Medicare HMO | Admitting: Family Medicine

## 2023-01-01 ENCOUNTER — Encounter (INDEPENDENT_AMBULATORY_CARE_PROVIDER_SITE_OTHER): Payer: Self-pay | Admitting: Family Medicine

## 2023-01-01 VITALS — BP 90/58 | HR 87 | Temp 98.2°F | Ht 62.5 in | Wt 173.0 lb

## 2023-01-01 DIAGNOSIS — Z6831 Body mass index (BMI) 31.0-31.9, adult: Secondary | ICD-10-CM | POA: Diagnosis not present

## 2023-01-01 DIAGNOSIS — R7303 Prediabetes: Secondary | ICD-10-CM | POA: Diagnosis not present

## 2023-01-01 DIAGNOSIS — E669 Obesity, unspecified: Secondary | ICD-10-CM

## 2023-01-01 DIAGNOSIS — N189 Chronic kidney disease, unspecified: Secondary | ICD-10-CM | POA: Diagnosis not present

## 2023-01-01 NOTE — Progress Notes (Unsigned)
Chief Complaint:   OBESITY [place] is here to discuss her progress with her obesity treatment plan along with follow-up of her obesity related diagnoses. Frances Hoffman is on keeping a food journal and adhering to recommended goals of 1000-1200 calories and 75+ grams of protein and states she is following her eating plan approximately 50% of the time. Frances Hoffman states she is riding the exercise bike for 10 minutes 3-4 times per week.  Today's visit was #: 5 Starting weight: 180 lbs Starting date: 10/30/2022 Today's weight: 173 lbs Today's date: 01/01/2023 Total lbs lost to date: 7 Total lbs lost since last in-office visit: 0  Interim History: Patient has done well with maintaining her weight loss. She is dealing with a UTI recently and may be retaining some water weight.   Subjective:   1. Prediabetes Patient was started on metformin after her last visit. She had significant diarrhea the same day so she stopped. I discussed labs with the patient today.   2. Chronic renal impairment, unspecified CKD stage Patient's GFR is below 60. She is trying to hydrate more. I discussed labs with the patient today.   Assessment/Plan:   1. Prediabetes Patient is ok to decrease her dose to 1/2 tablet daily (250 mg) if she would like to try again.   2. Chronic renal impairment, unspecified CKD stage Patient is to increase her water to at least 80 oz daily and we will follow-up.   3. BMI 31.0-31.9,adult  4. Obesity, Beginning BMI 32.40 Frances Hoffman is currently in the action stage of change. As such, her goal is to continue with weight loss efforts. She has agreed to keeping a food journal and adhering to recommended goals of 1000-1200 calories and 75+ grams of protein daily.   Recipe packet #1 was given today.   Exercise goals: As is.  Behavioral modification strategies: increasing lean protein intake.  Frances Hoffman has agreed to follow-up with our clinic in 3 weeks. She was informed of the importance  of frequent follow-up visits to maximize her success with intensive lifestyle modifications for her multiple health conditions.   Objective:   Blood pressure (!) 90/58, pulse 87, temperature 98.2 F (36.8 C), height 5' 2.5" (1.588 m), weight 173 lb (78.5 kg), SpO2 92%. Body mass index is 31.14 kg/m.  Lab Results  Component Value Date   CREATININE 1.04 (H) 12/20/2022   BUN 12 12/20/2022   NA 144 12/20/2022   K 4.5 12/20/2022   CL 104 12/20/2022   CO2 22 12/20/2022   Lab Results  Component Value Date   ALT 14 12/20/2022   AST 20 12/20/2022   ALKPHOS 108 12/20/2022   BILITOT 0.7 12/20/2022   Lab Results  Component Value Date   HGBA1C 5.8 (H) 12/20/2022   HGBA1C 5.9 (H) 11/28/2022   HGBA1C 6.1 02/15/2022   HGBA1C 5.9 02/16/2021   HGBA1C 5.4 01/21/2020   Lab Results  Component Value Date   INSULIN 18.7 12/20/2022   INSULIN 12.8 11/28/2022   Lab Results  Component Value Date   TSH 1.07 10/15/2022   Lab Results  Component Value Date   CHOL 170 11/28/2022   HDL 38 (L) 11/28/2022   LDLCALC 103 (H) 11/28/2022   LDLDIRECT 92.0 04/20/2014   TRIG 165 (H) 11/28/2022   CHOLHDL 4 02/15/2022   Lab Results  Component Value Date   VD25OH 67.4 11/28/2022   VD25OH 68.44 02/15/2022   VD25OH 56.7 11/26/2017   Lab Results  Component Value Date   WBC  8.0 11/28/2022   HGB 15.2 11/28/2022   HCT 46.0 11/28/2022   MCV 95 11/28/2022   PLT 296 11/28/2022   Lab Results  Component Value Date   IRON 74 07/04/2016   TIBC 319 07/04/2016   FERRITIN 111.1 08/23/2020   Attestation Statements:   Reviewed by clinician on day of visit: allergies, medications, problem list, medical history, surgical history, family history, social history, and previous encounter notes.   I, Burt Knack, am acting as transcriptionist for Quillian Quince, MD.  I have reviewed the above documentation for accuracy and completeness, and I agree with the above. -  Quillian Quince, MD

## 2023-01-02 LAB — URINE CULTURE
MICRO NUMBER:: 15651450
SPECIMEN QUALITY:: ADEQUATE

## 2023-01-03 ENCOUNTER — Ambulatory Visit (INDEPENDENT_AMBULATORY_CARE_PROVIDER_SITE_OTHER): Payer: Medicare HMO | Admitting: Family Medicine

## 2023-01-05 ENCOUNTER — Other Ambulatory Visit: Payer: Self-pay | Admitting: Neurology

## 2023-01-07 ENCOUNTER — Other Ambulatory Visit: Payer: Self-pay | Admitting: Neurology

## 2023-01-09 ENCOUNTER — Other Ambulatory Visit: Payer: Self-pay | Admitting: Neurology

## 2023-01-09 ENCOUNTER — Ambulatory Visit: Payer: Medicare HMO | Admitting: Primary Care

## 2023-01-10 ENCOUNTER — Ambulatory Visit: Payer: Medicare HMO | Admitting: Family Medicine

## 2023-01-10 ENCOUNTER — Other Ambulatory Visit: Payer: Self-pay | Admitting: Neurology

## 2023-01-10 NOTE — Telephone Encounter (Signed)
Pt has scheduled follow up appointment to be able to get refill for memantine (NAMENDA) 10 MG tablet

## 2023-01-11 ENCOUNTER — Telehealth: Payer: Self-pay | Admitting: Family

## 2023-01-11 MED ORDER — CEPHALEXIN 500 MG PO CAPS: 500.0000 mg | ORAL_CAPSULE | Freq: Three times a day (TID) | ORAL | 0 refills | Status: DC

## 2023-01-11 NOTE — Telephone Encounter (Signed)
Pt called stating she saw Dr. Alphonsus Sias on 12/31/22 for a uti. Pt states she doesn't feel as if the meds, sulfamethoxazole-trimethoprim (BACTRIM DS) 800-160 MG tablet, are working, due to her still have the uti. Pt asked if something else could be prescribed by Dr. Alphonsus Sias? Call back # [phone]

## 2023-01-11 NOTE — Telephone Encounter (Signed)
Spoke to pt

## 2023-01-12 ENCOUNTER — Encounter (HOSPITAL_BASED_OUTPATIENT_CLINIC_OR_DEPARTMENT_OTHER): Payer: Self-pay | Admitting: Emergency Medicine

## 2023-01-12 ENCOUNTER — Emergency Department (HOSPITAL_BASED_OUTPATIENT_CLINIC_OR_DEPARTMENT_OTHER)
Admission: EM | Admit: 2023-01-12 | Discharge: 2023-01-12 | Disposition: A | Discharge status: Home / Self Care | Payer: Medicare HMO | Attending: Emergency Medicine | Admitting: Emergency Medicine

## 2023-01-12 ENCOUNTER — Ambulatory Visit
Admission: EM | Admit: 2023-01-12 | Discharge: 2023-01-12 | Disposition: A | Discharge status: Home / Self Care | Payer: Medicare HMO | Attending: Internal Medicine | Admitting: Internal Medicine

## 2023-01-12 ENCOUNTER — Emergency Department (HOSPITAL_BASED_OUTPATIENT_CLINIC_OR_DEPARTMENT_OTHER): Payer: Medicare HMO

## 2023-01-12 ENCOUNTER — Other Ambulatory Visit: Payer: Self-pay

## 2023-01-12 DIAGNOSIS — R55 Syncope and collapse: Secondary | ICD-10-CM

## 2023-01-12 DIAGNOSIS — I771 Stricture of artery: Secondary | ICD-10-CM | POA: Diagnosis not present

## 2023-01-12 DIAGNOSIS — R42 Dizziness and giddiness: Secondary | ICD-10-CM | POA: Diagnosis not present

## 2023-01-12 DIAGNOSIS — J9811 Atelectasis: Secondary | ICD-10-CM | POA: Diagnosis not present

## 2023-01-12 LAB — DIFFERENTIAL
Abs Immature Granulocytes: 0.04 10*3/uL (ref 0.00–0.07)
Basophils Absolute: 0 10*3/uL (ref 0.0–0.1)
Basophils Relative: 0 %
Eosinophils Absolute: 0.1 10*3/uL (ref 0.0–0.5)
Eosinophils Relative: 1 %
Immature Granulocytes: 1 %
Lymphocytes Relative: 34 %
Lymphs Abs: 2.9 10*3/uL (ref 0.7–4.0)
Monocytes Absolute: 1 10*3/uL (ref 0.1–1.0)
Monocytes Relative: 12 %
Neutro Abs: 4.5 10*3/uL (ref 1.7–7.7)
Neutrophils Relative %: 52 %

## 2023-01-12 LAB — COMPREHENSIVE METABOLIC PANEL
ALT: 22 U/L (ref 0–44)
AST: 25 U/L (ref 15–41)
Albumin: 3.9 g/dL (ref 3.5–5.0)
Alkaline Phosphatase: 99 U/L (ref 38–126)
Anion gap: 13 (ref 5–15)
BUN: 12 mg/dL (ref 8–23)
CO2: 21 mmol/L — ABNORMAL LOW (ref 22–32)
Calcium: 9.6 mg/dL (ref 8.9–10.3)
Chloride: 101 mmol/L (ref 98–111)
Creatinine, Ser: 1.16 mg/dL — ABNORMAL HIGH (ref 0.44–1.00)
GFR, Estimated: 47 mL/min — ABNORMAL LOW (ref >60)
Glucose, Bld: 125 mg/dL — ABNORMAL HIGH (ref 70–99)
Potassium: 3.7 mmol/L (ref 3.5–5.1)
Sodium: 135 mmol/L (ref 135–145)
Total Bilirubin: 0.7 mg/dL (ref <1.2)
Total Protein: 7.3 g/dL (ref 6.5–8.1)

## 2023-01-12 LAB — CBC
HCT: 40.7 % (ref 36.0–46.0)
Hemoglobin: 13.6 g/dL (ref 12.0–15.0)
MCH: 30.9 pg (ref 26.0–34.0)
MCHC: 33.4 g/dL (ref 30.0–36.0)
MCV: 92.5 fL (ref 80.0–100.0)
Platelets: 278 10*3/uL (ref 150–400)
RBC: 4.4 MIL/uL (ref 3.87–5.11)
RDW: 13.3 % (ref 11.5–15.5)
WBC: 8.5 10*3/uL (ref 4.0–10.5)
nRBC: 0 % (ref 0.0–0.2)

## 2023-01-12 LAB — CBG MONITORING, ED: Glucose-Capillary: 129 mg/dL — ABNORMAL HIGH (ref 70–99)

## 2023-01-12 LAB — URINALYSIS, ROUTINE W REFLEX MICROSCOPIC
Bilirubin Urine: NEGATIVE
Glucose, UA: NEGATIVE mg/dL
Hgb urine dipstick: NEGATIVE
Ketones, ur: NEGATIVE mg/dL
Leukocytes,Ua: NEGATIVE
Nitrite: NEGATIVE
Protein, ur: NEGATIVE mg/dL
Specific Gravity, Urine: 1.015 (ref 1.005–1.030)
pH: 5.5 (ref 5.0–8.0)

## 2023-01-12 LAB — POCT FASTING CBG KUC MANUAL ENTRY: POCT Glucose (KUC): 106 mg/dL — AB (ref 70–99)

## 2023-01-12 MED ORDER — MECLIZINE HCL 50 MG PO TABS: 50.0000 mg | ORAL_TABLET | Freq: Three times a day (TID) | ORAL | 0 refills | Status: AC | PRN

## 2023-01-12 MED ORDER — MECLIZINE HCL 25 MG PO TABS
25.0000 mg | ORAL_TABLET | Freq: Once | ORAL | Status: AC
Start: 2023-01-12 — End: 2023-01-12
  Administered 2023-01-12: 25 mg via ORAL
  Filled 2023-01-12: qty 1

## 2023-01-12 NOTE — Discharge Instructions (Signed)
Go to the ER for further evaluation of your symptoms 

## 2023-01-12 NOTE — ED Triage Notes (Addendum)
Pt reports syncopal episode yesterday. States she got up from a chair and got dizzy and "blacked out and fell on the couch". States she thought she just got up too fast. Today c/o feeling "swimmy headed" and generalized weakness. States she usually has low BP and it was 84/68 this morning. Denies cardiac hx. She has eaten today- reports she has been told she is "pre-diabetic". She was dx with UTI at the end of October and is on antibiotics

## 2023-01-12 NOTE — ED Provider Notes (Signed)
[facility] Provider Note   CSN: [id number] Arrival date & time: 01/12/23  1415     History Chief Complaint  Patient presents with   Dizziness   Loss of Consciousness    HPI Frances Hoffman is a 82 y.o. female presenting for episodic dizziness.  She had a syncopal event where she fell backwards into the chair earlier today.  She denies fevers chills nausea vomiting shortness of breath.  Endorses a transient episodic spinning vertigo over the past 2 weeks.  She denies any acute symptoms at this time besides some mild dizziness. No history of similar.   Patient's recorded medical, surgical, social, medication list and allergies were reviewed in the Snapshot window as part of the initial history.   Review of Systems   Review of Systems  Constitutional:  Negative for chills and fever.  HENT:  Negative for ear pain and sore throat.   Eyes:  Negative for pain and visual disturbance.  Respiratory:  Negative for cough and shortness of breath.   Cardiovascular:  Negative for chest pain and palpitations.  Gastrointestinal:  Negative for abdominal pain and vomiting.  Genitourinary:  Negative for dysuria and hematuria.  Musculoskeletal:  Negative for arthralgias and back pain.  Skin:  Negative for color change and rash.  Neurological:  Positive for dizziness and syncope. Negative for seizures and facial asymmetry.  All other systems reviewed and are negative.   Physical Exam Updated Vital Signs BP 135/74   Pulse 91   Temp 98.1 F (36.7 C) (Oral)   Resp 18   Ht 5\' 4"  (1.626 m)   Wt 81.2 kg   SpO2 94%   BMI 30.73 kg/m  Physical Exam Vitals and nursing note reviewed.  Constitutional:      General: She is not in acute distress.    Appearance: She is well-developed.  HENT:     Head: Normocephalic and atraumatic.  Eyes:     Conjunctiva/sclera: Conjunctivae normal.  Cardiovascular:     Rate and Rhythm: Normal rate and regular  rhythm.     Heart sounds: No murmur heard. Pulmonary:     Effort: Pulmonary effort is normal. No respiratory distress.     Breath sounds: Normal breath sounds.  Abdominal:     General: There is no distension.     Palpations: Abdomen is soft.     Tenderness: There is no abdominal tenderness. There is no right CVA tenderness or left CVA tenderness.  Musculoskeletal:        General: No swelling or tenderness. Normal range of motion.     Cervical back: Neck supple.  Skin:    General: Skin is warm and dry.  Neurological:     General: No focal deficit present.     Mental Status: She is alert and oriented to person, place, and time. Mental status is at baseline.     Cranial Nerves: No cranial nerve deficit.      ED Course/ Medical Decision Making/ A&P    Procedures Procedures   Medications Ordered in ED Medications  meclizine (ANTIVERT) tablet 25 mg (has no administration in time range)   Medical Decision Making:   Frances Hoffman is a 82 y.o. female who presented to the ED today with dizziness detailed above.    Additional history discussed with patient's family/caregivers.  Patient placed on continuous vitals and telemetry monitoring while in ED which was reviewed periodically.  Complete initial physical exam performed, notably the patient  was HDS in NAD.    I personally performed a HINTS exam Head impulse test: Patient does have a corrective saccade to the right. Nystagmus: Patient does have nystagmus to the right on contralateral directional gaze. Test of skew: No skew. Hints exam localizes to the peripheral nervous system.  Reviewed and confirmed nursing documentation for past medical history, family history, social history.    Initial Assessment:   With the patient's presentation of episodic, intermittent dizziness that is worsened with motion activity, most likely diagnosis is BPPV. Other diagnoses were considered including (but not limited to) CVA, Labyrinthitis,  Vestibular Neuritis, Mnire's disease, occipital migraine, Inner Ear infection. These are considered less likely due to history of present illness and physical exam findings.   This is most consistent with an acute life/limb threatening illness complicated by underlying chronic conditions. Notably, due to duration of symptoms being greater than 4 and half hours, patient is not a candidate for code stroke activation.  Initial Plan:  Screening labs including CBC and Metabolic panel to evaluate for infectious or metabolic etiology of disease.  Urinalysis with reflex culture ordered to evaluate for UTI or relevant urologic/nephrologic pathology.  EKG to evaluate for cardiac pathology Objective evaluation as below reviewed with plan for reassessment after administration of Meclizine.  Initial Study Results:   Laboratory  All laboratory results reviewed without evidence of clinically relevant pathology.     EKG EKG was reviewed independently. Rate, rhythm, axis, intervals all examined and without medically relevant abnormality. ST segments without concerns for elevations.    Radiology:  All images reviewed independently. Agree with radiology report at this time.   DG Chest Portable 1 View  Result Date: 01/12/2023 CLINICAL DATA:  Status post syncopal episode with subsequent dizziness. EXAM: PORTABLE CHEST 1 VIEW COMPARISON:  April 17, 2021 FINDINGS: The heart size and mediastinal contours are within normal limits. There is marked severity calcification of the aortic arch with mild tortuosity of the descending thoracic aorta. Mild atelectatic changes are seen within the left lung base. There is no evidence of an acute infiltrate, pleural effusion or pneumothorax. The visualized skeletal structures are unremarkable. IMPRESSION: Mild left basilar atelectasis. Electronically Signed   By: Aram Candela M.D.   On: 01/12/2023 19:51      Reassessment and Plan:   When I initially met the patient.   I did inform them of plan was to admit her for a syncope evaluation. I was called back to bedside earlier in their evaluation did not dissipate as they are now requesting discharge. They state that they can follow-up with the syncopal event outpatient.  Discussed the findings today and that the dizziness is likely paroxysmal vertigo but that the syncope would warrant inpatient evaluation with an echocardiogram and possible telemetry monitoring overnight.  They have declined any further evaluation in the emergency room and feel comfortable discharge plan to follow-up with her normal PCP in outpatient setting.  Given their comfort with outpatient care and management patient discharged without further acute event.  Welcome to return for further care and management and evaluation of syncope if desired.  Disposition:  I have considered need for hospitalization, however, considering all of the above, I believe this patient is stable for discharge at this time.  Patient/family educated about specific return precautions for given chief complaint and symptoms.  Patient/family educated about follow-up with PCP.     Patient/family expressed understanding of return precautions and need for follow-up. Patient spoken to regarding all imaging and laboratory results  and appropriate follow up for these results. All education provided in verbal form with additional information in written form. Time was allowed for answering of patient questions. Patient discharged.    Emergency Department Medication Summary:   Medications  meclizine (ANTIVERT) tablet 25 mg (has no administration in time range)           Clinical Impression:  1. Dizziness      Discharge   Final Clinical Impression(s) / ED Diagnoses Final diagnoses:  Dizziness    Rx / DC Orders ED Discharge Orders          Ordered    meclizine (ANTIVERT) 50 MG tablet  3 times daily PRN        01/12/23 2059              Glyn Ade,  MD 01/12/23 2103

## 2023-01-12 NOTE — ED Triage Notes (Signed)
Pt reports dizziness s/p syncopal episode since yesterday around 1100; she stood up from sitting and then passed out; had been feeling normal up until then

## 2023-01-12 NOTE — ED Provider Notes (Signed)
[name]    CSN: [id number] Arrival date & time: 01/12/23  1116      History   Chief Complaint Chief Complaint  Patient presents with   Loss of Consciousness    HPI Frances Hoffman is a 82 y.o. female presents for evaluation of dizziness and syncope.  Patient has a past medical history of hypothyroidism, GERD, breast cancer, depression, hyperlipidemia, osteoporosis. patient reports yesterday she had an episode of syncope when getting up from the couch.  She states it was witnessed by her husband and does not think she was out for very long.  Reports she was oriented on awakening and denies any bowel or bladder incontinence.  Since then she has been reporting a persistent dizziness with some nausea.  She denies any headache, neck pain, visual changes, chest pain, shortness of breath.  Denies history of syncope.  Reports no more syncopal episodes since yesterday or any near syncopal episodes.  States she stays hydrated but does not know how much fluid she drinks in a day.  She drinks 1 soda a day otherwise water.  Denies any change in medications.  No history of diabetes.  She is undergoing treatment with Bactrim for a UTI that she started yesterday.  Reports her dysuria has resolved.  Denies any flank pain or fevers.  No recent respiratory illnesses.  No other concerns at this time.   Loss of Consciousness Associated symptoms: dizziness     Past Medical History:  Diagnosis Date   Adjustment disorder with mixed anxiety and depressed mood 12/12/2007   Qualifier: Diagnosis of  By: Copland MD, Spencer     B12 deficiency    Breast cancer (HCC)    Cancer (HCC) 2000   breast had lumpectomy/radiation x36,mammo ,no chemo   Cataract of both eyes    Depression    Dysuria-frequency syndrome    takes AZO   Family history of heart disease 08/27/2013   Frozen shoulder    Gall stones    GERD 12/12/2007   Qualifier: Diagnosis of  By: Copland MD, Spencer     Hyperlipidemia     Hypothyroidism 12/12/2007   Qualifier: Diagnosis of  By: Patsy Lager MD, Spencer     Lactose intolerance    Memory difficulties 09/27/2017   Memory loss    Memory loss 09/22/2014   Osteopenia    BMD 2004, WNL 2008   Personal history of radiation therapy    Pneumonia    Recurrent UTI    Shingles    chronic body pain, left side of body   Shortness of breath dyspnea    when climbing stairs only   Vitamin D deficiency    Wears glasses     Patient Active Problem List   Diagnosis Date Noted   Chronic renal impairment 01/01/2023   Acute cystitis without hematuria 12/31/2022   BMI 31.0-31.9,adult 12/20/2022   Obesity, Beginning BMI 32.40 12/20/2022   Eustachian tube dysfunction, right 07/26/2021   Seasonal allergic rhinitis due to pollen 07/06/2021   Irritable bowel syndrome with diarrhea 03/17/2019   B12 deficiency 02/04/2018   Xerostomia 01/23/2018   CKD (chronic kidney disease) stage 3, GFR 30-59 ml/min (HCC) 11/28/2017   Vascular dementia without behavioral disturbance (HCC) 09/27/2017   Prediabetes 09/27/2017   Cerebral vascular disease 07/02/2017   Chronic insomnia 07/02/2017   Primary osteoarthritis of first carpometacarpal joint of left hand 01/09/2017   History of breast cancer 11/14/2016   Obesity, Class I, BMI 30-34.9 08/19/2016   MDD (  major depressive disorder), recurrent episode, moderate (HCC) 12/23/2015   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   Asx Hypotension 09/25/2015   Fecal incontinence 07/29/2015   Exercise intolerance 07/30/2014   Positional vertigo 08/18/2013   Fatigue 08/15/2009   Osteopenia 11/26/2008   Recurrent UTI 01/15/2008   Hypothyroidism 12/12/2007   Hyperlipidemia 12/12/2007   Chronic GERD 12/12/2007    Past Surgical History:  Procedure Laterality Date   BREAST LUMPECTOMY Left 2000   CHOLECYSTECTOMY N/A 02/21/2015   Procedure: LAPAROSCOPIC CHOLECYSTECTOMY WITH INTRAOPERATIVE CHOLANGIOGRAM;  Surgeon: Gaynelle Adu, MD;  Location: [facility] OR;   Service: General;  Laterality: N/A;   COLONOSCOPY     MULTIPLE TOOTH EXTRACTIONS      OB History     Gravida  2   Para  2   Term  2   Preterm      AB      Living         SAB      IAB      Ectopic      Multiple      Live Births               Home Medications    Prior to Admission medications   Medication Sig Start Date End Date Taking? Authorizing Provider  acetaminophen (TYLENOL) 500 MG tablet Take 500 mg by mouth every 6 (six) hours as needed.    [provider]  aspirin-sod bicarb-citric acid (ALKA-SELTZER) 325 MG TBEF tablet Take 325 mg by mouth every 6 (six) hours as needed.    [provider]  atorvastatin (LIPITOR) 40 MG tablet Take 1 tablet (40 mg total) by mouth daily. For cholesterol. 02/20/22   Eustaquio Boyden, MD  calcium carbonate (OS-CAL - DOSED IN MG OF ELEMENTAL CALCIUM) 1250 (500 CA) MG tablet Take 1 tablet by mouth 2 (two) times daily with a meal.    [provider]  carboxymethylcellulose (REFRESH PLUS) 0.5 % SOLN Place 1 drop into both eyes 3 (three) times daily as needed (for dryness).    [provider]  cephALEXin (KEFLEX) 500 MG capsule Take 1 capsule (500 mg total) by mouth 3 (three) times daily. 01/11/23   Karie Schwalbe, MD  Cholecalciferol (VITAMIN D3) 2000 UNITS capsule Take 2,000 Units by mouth daily.    [provider]  Cinnamon 500 MG TABS Take 500 mg by mouth daily.    [provider]  cyanocobalamin ([employer]S VITAMIN B12) 2000 MCG tablet Take 1 tablet (2,000 mcg total) by mouth daily. 02/04/18   Opalski, Deborah, DO  escitalopram (LEXAPRO) 20 MG tablet Take 1 tablet (20 mg total) by mouth daily. 02/20/22   Eustaquio Boyden, MD  levothyroxine (SYNTHROID) 88 MCG tablet Take 1 tablet (88 mcg total) by mouth daily. 08/23/22   [name], FNP  memantine (NAMENDA) 10 MG tablet Take 1 tablet by mouth twice daily 01/10/23   Levert Feinstein, MD  metFORMIN (GLUCOPHAGE) 500 MG tablet Take 1  tablet (500 mg total) by mouth daily with breakfast. 12/20/22   Quillian Quince D, MD  METHYLCELLULOSE OP Take 500 mg by mouth daily.    [provider]  omeprazole (PRILOSEC) 20 MG capsule Take 20 mg by mouth daily.    [provider]  sulfamethoxazole-trimethoprim (BACTRIM DS) 800-160 MG tablet Take 1 tablet by mouth 2 (two) times daily. 12/31/22   Karie Schwalbe, MD    Family History Family History  Problem Relation Age of Onset   Osteoporosis Mother  Hypertension Mother    Sleep apnea Mother    Obesity Mother    Parkinsonism Father    Breast cancer Sister 59   Coronary artery disease Sister    Ovarian cancer Sister    Heart disease Son    Heart attack Son 28   Heart disease Maternal Grandmother    Heart disease Maternal Grandfather    Heart attack Maternal Grandfather 63   Breast cancer Other     Social History Social History   Tobacco Use   Smoking status: Never   Smokeless tobacco: Never  Vaping Use   Vaping status: Never Used  Substance Use Topics   Alcohol use: No    Alcohol/week: 0.0 standard drinks of alcohol   Drug use: No     Allergies   Ciprofloxacin, Citalopram hydrobromide, Flagyl [metronidazole], Milk-related compounds, and Oxycodone   Review of Systems Review of Systems  Cardiovascular:  Positive for syncope.  Neurological:  Positive for dizziness and syncope.     Physical Exam Triage Vital Signs ED Triage Vitals [01/12/23 1132]  Encounter Vitals Group     BP 113/69     Systolic BP Percentile      Diastolic BP Percentile      Pulse Rate 82     Resp 18     Temp 98.1 F (36.7 C)     Temp src      SpO2 92 %     Weight      Height      Head Circumference      Peak Flow      Pain Score 2     Pain Loc      Pain Education      Exclude from Growth Chart    No data found.  Updated Vital Signs BP 113/69 (BP Location: Left Arm)   Pulse 82   Temp 98.1 F (36.7 C)   Resp 18   SpO2 92%   Visual Acuity Right  Eye Distance:   Left Eye Distance:   Bilateral Distance:    Right Eye Near:   Left Eye Near:    Bilateral Near:     Physical Exam Vitals and nursing note reviewed.  Constitutional:      General: She is not in acute distress.    Appearance: Normal appearance. She is not ill-appearing, toxic-appearing or diaphoretic.  HENT:     Head: Normocephalic and atraumatic.     Right Ear: Tympanic membrane and ear canal normal.     Left Ear: Tympanic membrane and ear canal normal.  Eyes:     Extraocular Movements: Extraocular movements intact.     Conjunctiva/sclera: Conjunctivae normal.     Pupils: Pupils are equal, round, and reactive to light.  Cardiovascular:     Rate and Rhythm: Normal rate and regular rhythm.     Heart sounds: Normal heart sounds.  Pulmonary:     Effort: Pulmonary effort is normal.     Breath sounds: Normal breath sounds.  Skin:    General: Skin is warm and dry.  Neurological:     General: No focal deficit present.     Mental Status: She is alert and oriented to person, place, and time.     GCS: GCS eye subscore is 4. GCS verbal subscore is 5. GCS motor subscore is 6.     Cranial Nerves: No facial asymmetry.     Motor: No weakness.     Coordination: Finger-Nose-Finger Test normal.  Psychiatric:  Mood and Affect: Mood normal.        Behavior: Behavior normal.      UC Treatments / Results  Labs (all labs ordered are listed, but only abnormal results are displayed) Labs Reviewed  POCT FASTING CBG [facility] MANUAL ENTRY - Abnormal; Notable for the following components:      Result Value   POCT Glucose ([facility]) 106 (*)    All other components within normal limits    EKG   Radiology No results found.  Procedures ED EKG  Date/Time: 01/12/2023 12:04 PM  Performed by: Radford Pax, NP Authorized by: Radford Pax, NP   ECG interpreted by ED Physician in the absence of a cardiologist: no   Previous ECG:    Previous ECG:  Compared to  current Interpretation:    Interpretation: abnormal   Rate:    ECG rate:  80   ECG rate assessment: normal   Rhythm:    Rhythm: sinus rhythm   Ectopy:    Ectopy: none   Comments:     Sinus rhythm heart rate 80 with nonspecific ST-T wave changes, similar to EKG from August.  (including critical care time)  Medications Ordered in UC Medications - No data to display  Initial Impression / Assessment and Plan / UC Course  I have reviewed the triage vital signs and the nursing notes.  Pertinent labs & imaging results that were available during my care of the patient were reviewed by me and considered in my medical decision making (see chart for details).     I reviewed exam and symptoms with patient and husband.  I discussed limitations and abilities of urgent care.  82 year old female presenting with syncope and persistent dizziness since yesterday.  Blood sugar normal and blood pressure/vitals are stable in clinic.  EKG seems similar to previous EKGs with ALT any acute changes.  Discussed multiple causes of dizziness including neurological, cardiac, dehydration, infection, etc.  Given her symptoms advise she go to the ER for further evaluation and treatment.  She is in agreement with plan and will go to one of our med centers for further workup with her husband driving.  Advised to pull over and call 911 for any worsening symptoms that occur in transit and she verbalized understanding. Final Clinical Impressions(s) / UC Diagnoses   Final diagnoses:  Dizziness  Syncope, unspecified syncope type     Discharge Instructions      Go to the ER for further evaluation of your symptoms    ED Prescriptions   None    PDMP not reviewed this encounter.   Radford Pax, NP 01/12/23 [phone]

## 2023-01-14 ENCOUNTER — Telehealth: Payer: Self-pay

## 2023-01-14 NOTE — Telephone Encounter (Signed)
Reviewed ER notes as well. Pt to schedule f/u ER if necessary.

## 2023-01-14 NOTE — Telephone Encounter (Addendum)
Per chart review tab pt was seen [facility] ED on 01/12/23. Per appt notes pt has appt scheduled with [facility] on 01/17/23. Sending note to Hayden Pedro FNP ands [place] pool.

## 2023-01-16 NOTE — Progress Notes (Unsigned)
No chief complaint on file.     ASSESSMENT AND PLAN  Frances Hoffman is a 82 y.o. female   Mild cognitive impairment  MRI of the brain in 2019 showed generalized atrophy moderate supratentorium small vessel disease  Laboratory evaluation showed no treatable etiology  Family history of dementia, Parkinson's disease,  Most suggestive of early stage of central nervous system degenerative disorder,  MoCA examination 23/30  Refilled Aricept 10 mg daily Namenda 10 mg twice a day  Encouraged her moderate exercise  Continue follow-up with primary care physician  DIAGNOSTIC DATA (LABS, IMAGING, TESTING) - I reviewed patient records, labs, notes, testing and imaging myself where available.   MEDICAL HISTORY:  Frances Hoffman is a 82 year old female, seen in request by her primary care physician Dr. Selena Batten, Chryl Heck, for evaluation of memory loss, she is accompanied by her husband at today's visit November 24, 2020  I reviewed and summarized the referring note.  Past medical history Breast cancer in 2000, left lobectomy, radiation Depression GERD Hyperlipidemia Hypothyroidism  I saw her more than 3 years ago, for complaints of memory loss  Family history of memory loss, father had Parkinson's disease, memory loss, sister also suffered dementia  Since 2018 she was noted to have mild memory loss, gradually getting worse, especially short-term memory, MoCA examination 25/30, previously complains of mood swings, now stabilized with Lexapro 20 mg daily, she is still driving, but got lost couple times,  I personally reviewed MRI of the brain in February 2019, mild atrophy, moderate supratentorium small vessel disease  Laboratory evaluation showed normal TSH B12, CMP, CBC, She has been taking Aricept, 10 mg daily, tolerating it well,  UPDATE Jan 04 2022: She is accompanied by her husband at today's clinical visit, overall doing well, likes to read, but is getting difficult for her to  follow the story line, more time watching TV, like to do word puzzles  She sleeps okay, but woke up 1-2 times every night to use bathroom  Update January 17, 2023 SS:   PHYSICAL EXAM:   There were no vitals filed for this visit. BP 120/80,  There is no height or weight on file to calculate BMI.        01/04/2022    1:40 PM 11/24/2020   10:55 AM 08/23/2020    3:21 PM  Montreal Cognitive Assessment   Visuospatial/ Executive (0/5) 4 5 1   Naming (0/3) 2 3 2   Attention: Read list of digits (0/2) 1 2 2   Attention: Read list of letters (0/1) 1 1 1   Attention: Serial 7 subtraction starting at 100 (0/3) 3 3 1   Language: Repeat phrase (0/2) 2 1 2   Language : Fluency (0/1) 0 1 0  Abstraction (0/2) 2 2 2   Delayed Recall (0/5) 2 1 2   Orientation (0/6) 6 6 6   Total 23 25 19   Adjusted Score (based on education) 23 25      Physical Exam  General: The patient is alert and cooperative at the time of the examination.  Skin: No significant peripheral edema is noted.   Neurologic Exam  Mental status: The patient is alert and oriented x 3 at the time of the examination. The patient has apparent normal recent and remote memory, with an apparently normal attention span and concentration ability.   Cranial nerves: Facial symmetry is present. Speech is normal, no aphasia or dysarthria is noted. Extraocular movements are full. Visual fields are full.  Motor: The patient has good strength  in all 4 extremities.  Sensory examination: Soft touch sensation is symmetric on the face, arms, and legs.  Coordination: The patient has good finger-nose-finger and heel-to-shin bilaterally.  Gait and station: The patient has a normal gait. Tandem gait is normal. Romberg is negative. No drift is seen.  Reflexes: Deep tendon reflexes are symmetric.   REVIEW OF SYSTEMS:  Full 14 system review of systems performed and notable only for as above All other review of systems were  negative.   ALLERGIES: Allergies  Allergen Reactions   Ciprofloxacin Other (See Comments)    Unknown (vertigo??)   Citalopram Hydrobromide Other (See Comments)    Intrusive/ odd thoughts    Flagyl [Metronidazole] Other (See Comments)    Unknown??   Milk-Related Compounds    Oxycodone Other (See Comments)    Headaches    HOME MEDICATIONS: Current Outpatient Medications  Medication Sig Dispense Refill   acetaminophen (TYLENOL) 500 MG tablet Take 500 mg by mouth every 6 (six) hours as needed.     aspirin-sod bicarb-citric acid (ALKA-SELTZER) 325 MG TBEF tablet Take 325 mg by mouth every 6 (six) hours as needed.     atorvastatin (LIPITOR) 40 MG tablet Take 1 tablet (40 mg total) by mouth daily. For cholesterol. 90 tablet 4   calcium carbonate (OS-CAL - DOSED IN MG OF ELEMENTAL CALCIUM) 1250 (500 CA) MG tablet Take 1 tablet by mouth 2 (two) times daily with a meal.     carboxymethylcellulose (REFRESH PLUS) 0.5 % SOLN Place 1 drop into both eyes 3 (three) times daily as needed (for dryness).     cephALEXin (KEFLEX) 500 MG capsule Take 1 capsule (500 mg total) by mouth 3 (three) times daily. 21 capsule 0   Cholecalciferol (VITAMIN D3) 2000 UNITS capsule Take 2,000 Units by mouth daily.     Cinnamon 500 MG TABS Take 500 mg by mouth daily.     cyanocobalamin (CVS VITAMIN B12) 2000 MCG tablet Take 1 tablet (2,000 mcg total) by mouth daily.     escitalopram (LEXAPRO) 20 MG tablet Take 1 tablet (20 mg total) by mouth daily. 90 tablet 4   levothyroxine (SYNTHROID) 88 MCG tablet Take 1 tablet (88 mcg total) by mouth daily. 90 tablet 3   meclizine (ANTIVERT) 50 MG tablet Take 1 tablet (50 mg total) by mouth 3 (three) times daily as needed. 30 tablet 0   memantine (NAMENDA) 10 MG tablet Take 1 tablet by mouth twice daily 180 tablet 0   metFORMIN (GLUCOPHAGE) 500 MG tablet Take 1 tablet (500 mg total) by mouth daily with breakfast. 30 tablet 0   METHYLCELLULOSE OP Take 500 mg by mouth daily.      omeprazole (PRILOSEC) 20 MG capsule Take 20 mg by mouth daily.     sulfamethoxazole-trimethoprim (BACTRIM DS) 800-160 MG tablet Take 1 tablet by mouth 2 (two) times daily. 6 tablet 1   No current facility-administered medications for this visit.    PAST MEDICAL HISTORY: Past Medical History:  Diagnosis Date   Adjustment disorder with mixed anxiety and depressed mood 12/12/2007   Qualifier: Diagnosis of  By: Copland MD, Spencer     B12 deficiency    Breast cancer (HCC)    Cancer (HCC) 2000   breast had lumpectomy/radiation x36,mammo ,no chemo   Cataract of both eyes    Depression    Dysuria-frequency syndrome    takes AZO   Family history of heart disease 08/27/2013   Frozen shoulder    Gall stones  GERD 12/12/2007   Qualifier: Diagnosis of  By: Copland MD, Spencer     Hyperlipidemia    Hypothyroidism 12/12/2007   Qualifier: Diagnosis of  By: Copland MD, Spencer     Lactose intolerance    Memory difficulties 09/27/2017   Memory loss    Memory loss 09/22/2014   Osteopenia    BMD 2004, WNL 2008   Personal history of radiation therapy    Pneumonia    Recurrent UTI    Shingles    chronic body pain, left side of body   Shortness of breath dyspnea    when climbing stairs only   Vitamin D deficiency    Wears glasses     PAST SURGICAL HISTORY: Past Surgical History:  Procedure Laterality Date   BREAST LUMPECTOMY Left 2000   CHOLECYSTECTOMY N/A 02/21/2015   Procedure: LAPAROSCOPIC CHOLECYSTECTOMY WITH INTRAOPERATIVE CHOLANGIOGRAM;  Surgeon: Gaynelle Adu, MD;  Location: [facility] OR;  Service: General;  Laterality: N/A;   COLONOSCOPY     MULTIPLE TOOTH EXTRACTIONS      FAMILY HISTORY: Family History  Problem Relation Age of Onset   Osteoporosis Mother    Hypertension Mother    Sleep apnea Mother    Obesity Mother    Parkinsonism Father    Breast cancer Sister 55   Coronary artery disease Sister    Ovarian cancer Sister    Heart disease Son    Heart attack Son 78    Heart disease Maternal Grandmother    Heart disease Maternal Grandfather    Heart attack Maternal Grandfather 70   Breast cancer Other     SOCIAL HISTORY: Social History   Socioeconomic History   Marital status: Married    Spouse name: Financial planner    Number of children: 2   Years of education: High school   Highest education level: High school graduate  Occupational History   Not on file  Tobacco Use   Smoking status: Never   Smokeless tobacco: Never  Vaping Use   Vaping status: Never Used  Substance and Sexual Activity   Alcohol use: No    Alcohol/week: 0.0 standard drinks of alcohol   Drug use: No   Sexual activity: Yes    Partners: Male    Birth control/protection: Post-menopausal  Other Topics Concern   Not on file  Social History Narrative   Lives at home with her husband. [name]   2 adult sons  - Lockett and Minerva Areola   2 Grandchildren - both other   Exercise: goes to [employer] - at least 2 times a week   Diet: anything that tastes good, less pepsi's with the tongue   Enjoys: square dance, eating out with friends, going to church - Sunday and Wednesday   Social Determinants of Health   Financial Resource Strain: Low Risk  (01/24/2022)   Overall Financial Resource Strain (CARDIA)    Difficulty of Paying Living Expenses: Not hard at all  Food Insecurity: No Food Insecurity (01/24/2022)   Hunger Vital Sign    Worried About Running Out of Food in the Last Year: Never true    Ran Out of Food in the Last Year: Never true  Transportation Needs: No Transportation Needs (01/24/2022)   PRAPARE - Administrator, Civil Service (Medical): No    Lack of Transportation (Non-Medical): No  Physical Activity: Insufficiently Active (01/24/2022)   Exercise Vital Sign    Days of Exercise per Week: 3 days    Minutes of Exercise  per Session: 30 min  Stress: No Stress Concern Present (01/24/2022)   [employer] of Occupational Health - Occupational Stress  Questionnaire    Feeling of Stress : Only a little  Social Connections: Moderately Integrated (01/24/2022)   Social Connection and Isolation Panel [NHANES]    Frequency of Communication with Friends and Family: More than three times a week    Frequency of Social Gatherings with Friends and Family: Once a week    Attends Religious Services: More than 4 times per year    Active Member of [employer] or Organizations: No    Attends Banker Meetings: Never    Marital Status: Married  Catering manager Violence: Not At Risk (01/24/2022)   Humiliation, Afraid, Rape, and Kick questionnaire    Fear of Current or Ex-Partner: No    Emotionally Abused: No    Physically Abused: No    Sexually Abused: No    Margie Ege, Edrick Oh, DNP  [facility] [address], [address] [place], [place] [address] [phone]

## 2023-01-17 ENCOUNTER — Encounter: Payer: Self-pay | Admitting: Family

## 2023-01-17 ENCOUNTER — Ambulatory Visit (INDEPENDENT_AMBULATORY_CARE_PROVIDER_SITE_OTHER): Payer: Medicare HMO | Admitting: Family

## 2023-01-17 ENCOUNTER — Ambulatory Visit: Payer: Medicare HMO | Admitting: Neurology

## 2023-01-17 ENCOUNTER — Encounter: Payer: Self-pay | Admitting: Neurology

## 2023-01-17 VITALS — BP 86/60 | HR 71 | Temp 97.5°F | Ht 64.0 in | Wt 177.2 lb

## 2023-01-17 VITALS — BP 82/52 | HR 79 | Ht 64.0 in | Wt 176.2 lb

## 2023-01-17 DIAGNOSIS — N1831 Chronic kidney disease, stage 3a: Secondary | ICD-10-CM

## 2023-01-17 DIAGNOSIS — F0150 Vascular dementia without behavioral disturbance: Secondary | ICD-10-CM | POA: Diagnosis not present

## 2023-01-17 DIAGNOSIS — K146 Glossodynia: Secondary | ICD-10-CM

## 2023-01-17 DIAGNOSIS — N3000 Acute cystitis without hematuria: Secondary | ICD-10-CM

## 2023-01-17 DIAGNOSIS — I679 Cerebrovascular disease, unspecified: Secondary | ICD-10-CM

## 2023-01-17 DIAGNOSIS — E039 Hypothyroidism, unspecified: Secondary | ICD-10-CM | POA: Diagnosis not present

## 2023-01-17 DIAGNOSIS — I951 Orthostatic hypotension: Secondary | ICD-10-CM

## 2023-01-17 DIAGNOSIS — R944 Abnormal results of kidney function studies: Secondary | ICD-10-CM

## 2023-01-17 DIAGNOSIS — R42 Dizziness and giddiness: Secondary | ICD-10-CM | POA: Diagnosis not present

## 2023-01-17 LAB — BASIC METABOLIC PANEL
BUN: 13 mg/dL (ref 6–23)
CO2: 32 meq/L (ref 19–32)
Calcium: 9.9 mg/dL (ref 8.4–10.5)
Chloride: 101 meq/L (ref 96–112)
Creatinine, Ser: 1.11 mg/dL (ref 0.40–1.20)
GFR: 46.35 mL/min — ABNORMAL LOW (ref >60.00)
Glucose, Bld: 100 mg/dL — ABNORMAL HIGH (ref 70–99)
Potassium: 4.8 meq/L (ref 3.5–5.1)
Sodium: 141 meq/L (ref 135–145)

## 2023-01-17 LAB — CBC
HCT: 44.2 % (ref 36.0–46.0)
Hemoglobin: 14.5 g/dL (ref 12.0–15.0)
MCHC: 32.8 g/dL (ref 30.0–36.0)
MCV: 94.8 fL (ref 78.0–100.0)
Platelets: 308 10*3/uL (ref 150.0–400.0)
RBC: 4.67 Mil/uL (ref 3.87–5.11)
RDW: 14.2 % (ref 11.5–15.5)
WBC: 7.3 10*3/uL (ref 4.0–10.5)

## 2023-01-17 LAB — VITAMIN B12: Vitamin B-12: >1537 pg/mL — ABNORMAL HIGH (ref 211–911)

## 2023-01-17 LAB — TSH: TSH: 0.61 u[IU]/mL (ref 0.35–5.50)

## 2023-01-17 MED ORDER — FLUDROCORTISONE ACETATE 0.1 MG PO TABS: 0.0500 mg | ORAL_TABLET | Freq: Every day | ORAL | 3 refills | Status: DC

## 2023-01-17 MED ORDER — MEMANTINE HCL 10 MG PO TABS: 10.0000 mg | ORAL_TABLET | Freq: Two times a day (BID) | ORAL | 3 refills | Status: DC

## 2023-01-17 NOTE — Patient Instructions (Addendum)
We will check MRI, ECHO, Carotid Ultrasound Start aspirin 81 mg daily due to cerebrovascular disease Start Florinef once daily to help increase BP, if continues to have dizziness low blood pressure in 1 week, will increase to 1 full tablet Drink plenty of water, monitor blood pressure at home, if elevated let us know will stop Florinef  May consider having PCP check TSH today

## 2023-01-17 NOTE — Patient Instructions (Addendum)
  Increase water intake, goal is about 90 oz daily, try to start at least 4 bottles daily.  Start baby aspirin 81 mg once daily.   Monitor blood pressure.  Use walker.

## 2023-01-17 NOTE — Progress Notes (Signed)
Chart reviewed, agree above plan ?

## 2023-01-17 NOTE — Progress Notes (Signed)
Established Patient Office Visit  Subjective:      CC:  Chief Complaint  Patient presents with   Follow-up    [facility] 01/12/2023 - Dizziness    HPI: Frances Hoffman is a 82 y.o. female presenting on 01/17/2023 for Follow-up ([facility] 01/12/2023 - Dizziness) . [place] [employer] [place] c/o syncopal event where she fell backwards into the chair earlier that morning. About two weeks prior and current with episodic vertigo sensation. Suspected dx BPPV. Given meclizine upon discharge.  She is accompanied by her husband today, he did not fully witness her fall. He states he was focused on something else when he heard her fall on the sofa because she had stood up without him seeing her do so. He states there is not likely any chance she hit her head as the couch is very soft, she fell backwards when she fell he states her entire upper body was on the couch. After falling he had checked her blood pressure it was low at home about 88/54. She does admit she only drinks about one 10 oz bottle of water daily.  Hypothyroid: takes thyroid medication prior to eating and taking any other medications, husband states always early in the am. Does not take biotin. Takes her vitamins at night as well as her PPI.  UTI, still taking keflex 500 mg tid , started 11/8, on day six. Pt states that she is no longer with urinary symptoms.   She states that her mouth always stings and burns. She has been to [facility]. She brushes teeth and uses mouth wash daily without improvement. Mouth dryness at times.   Wt Readings from Last 3 Encounters:  01/17/23 177 lb 3.2 oz (80.4 kg)  01/17/23 176 lb 3.2 oz (79.9 kg)  01/12/23 179 lb (81.2 kg)   Temp Readings from Last 3 Encounters:  01/17/23 (!) 97.5 F (36.4 C) (Temporal)  01/12/23 98.1 F (36.7 C) (Oral)  01/12/23 98.1 F (36.7 C)   BP Readings from Last 3 Encounters:  01/17/23 (!) 86/60  01/17/23 (!) 82/52   01/12/23 110/72   Pulse Readings from Last 3 Encounters:  01/17/23 71  01/17/23 79  01/12/23 78     New complaints:  Seen this am with NP Margie Ege with neurology. Advised to continue namenda 10 mg bid, evaluated for the dizziness and neurology recommended daily ASA, started on florinef 0.05 mg daily and if symptomatic x one week increase to 0.1 mg daily, log BP. Ordered MRI brain, echocardiogram and carotid u/s and advised to f/u with neurology in three months. MOCA in office 15/30.   Is pt drinking water?  She is recovering from UTI.  Check TSH today        Social history:  Relevant past medical, surgical, family and social history reviewed and updated as indicated. Interim medical history since our last visit reviewed.  Allergies and medications reviewed and updated.  DATA REVIEWED: CHART IN EPIC     ROS: Negative unless specifically indicated above in HPI.    Current Outpatient Medications:    acetaminophen (TYLENOL) 500 MG tablet, Take 500 mg by mouth every 6 (six) hours as needed., Disp: , Rfl:    aspirin-sod bicarb-citric acid (ALKA-SELTZER) 325 MG TBEF tablet, Take 325 mg by mouth every 6 (six) hours as needed., Disp: , Rfl:    atorvastatin (LIPITOR) 40 MG tablet, Take 1 tablet (40 mg total) by mouth daily. For cholesterol.,  Disp: 90 tablet, Rfl: 4   calcium carbonate (OS-CAL - DOSED IN MG OF ELEMENTAL CALCIUM) 1250 (500 CA) MG tablet, Take 1 tablet by mouth 2 (two) times daily with a meal., Disp: , Rfl:    carboxymethylcellulose (REFRESH PLUS) 0.5 % SOLN, Place 1 drop into both eyes 3 (three) times daily as needed (for dryness)., Disp: , Rfl:    cephALEXin (KEFLEX) 500 MG capsule, Take 1 capsule (500 mg total) by mouth 3 (three) times daily., Disp: 21 capsule, Rfl: 0   Cholecalciferol (VITAMIN D3) 2000 UNITS capsule, Take 2,000 Units by mouth daily., Disp: , Rfl:    Cinnamon 500 MG TABS, Take 500 mg by mouth daily., Disp: , Rfl:    cyanocobalamin ([employer] VITAMIN  B12) 2000 MCG tablet, Take 1 tablet (2,000 mcg total) by mouth daily., Disp: , Rfl:    escitalopram (LEXAPRO) 20 MG tablet, Take 1 tablet (20 mg total) by mouth daily., Disp: 90 tablet, Rfl: 4   fludrocortisone (FLORINEF) 0.1 MG tablet, Take 0.5 tablets (0.05 mg total) by mouth daily., Disp: 30 tablet, Rfl: 3   levothyroxine (SYNTHROID) 88 MCG tablet, Take 1 tablet (88 mcg total) by mouth daily., Disp: 90 tablet, Rfl: 3   meclizine (ANTIVERT) 50 MG tablet, Take 1 tablet (50 mg total) by mouth 3 (three) times daily as needed., Disp: 30 tablet, Rfl: 0   memantine (NAMENDA) 10 MG tablet, Take 1 tablet (10 mg total) by mouth 2 (two) times daily., Disp: 180 tablet, Rfl: 3   metFORMIN (GLUCOPHAGE) 500 MG tablet, Take 1 tablet (500 mg total) by mouth daily with breakfast., Disp: 30 tablet, Rfl: 0   METHYLCELLULOSE OP, Take 500 mg by mouth daily., Disp: , Rfl:    omeprazole (PRILOSEC) 20 MG capsule, Take 20 mg by mouth daily., Disp: , Rfl:       Objective:    BP (!) 86/60 (BP Location: Right Arm, Patient Position: Sitting, Cuff Size: Normal)   Pulse 71   Temp (!) 97.5 F (36.4 C) (Temporal)   Ht 5\' 4"  (1.626 m)   Wt 177 lb 3.2 oz (80.4 kg)   SpO2 94%   BMI 30.42 kg/m   Wt Readings from Last 3 Encounters:  01/17/23 177 lb 3.2 oz (80.4 kg)  01/17/23 176 lb 3.2 oz (79.9 kg)  01/12/23 179 lb (81.2 kg)    Physical Exam Constitutional:      General: She is not in acute distress.    Appearance: Normal appearance. She is normal weight. She is not ill-appearing, toxic-appearing or diaphoretic.  HENT:     Head: Normocephalic.     Right Ear: Tympanic membrane normal.     Left Ear: Tympanic membrane normal.     Nose: Nose normal.     Mouth/Throat:     Mouth: Mucous membranes are dry.     Pharynx: No oropharyngeal exudate or posterior oropharyngeal erythema.  Eyes:     Extraocular Movements: Extraocular movements intact.     Pupils: Pupils are equal, round, and reactive to light.   Cardiovascular:     Rate and Rhythm: Normal rate and regular rhythm.     Pulses: Normal pulses.     Heart sounds: Normal heart sounds.  Pulmonary:     Effort: Pulmonary effort is normal.     Breath sounds: Normal breath sounds.  Musculoskeletal:     Cervical back: Normal range of motion.  Neurological:     General: No focal deficit present.     Mental Status:  She is alert and oriented to person, place, and time. Mental status is at baseline.  Psychiatric:        Mood and Affect: Mood normal.        Behavior: Behavior normal.        Thought Content: Thought content normal.        Judgment: Judgment normal.           Assessment & Plan:  Dizziness Assessment & Plan: Reviewed hospital f/u will order cbc b12 to r/o ddx anemia vs b12 def  Orders: -     Vitamin B12 -     CBC -     IBC + Ferritin; Future  Decreased GFR -     Basic metabolic panel  Orthostatic hypotension  Acquired hypothyroidism Assessment & Plan: Repeat tsh with tpo  Pending results.   Orders: -     TSH -     Thyroid Peroxidase Antibodies (TPO) (REFL)  Burning mouth syndrome Assessment & Plan: Could be multifactoral Handout given to pt on burning mouth syndrome B12 adequately replenished   Acute cystitis without hematuria Assessment & Plan: Continue keflex as prescribed.  Monitor for return of symptoms, and for red flag symptoms such as vomiting and or fever.    Stage 3a chronic kidney disease (HCC) Assessment & Plan: Repeat bmp pending results.  Will monitor with metformin if we need to consider d/c      Return in about 1 week (around 01/24/2023) for follow up dizziness.  [name], MSN, APRN, FNP-C [facility]

## 2023-01-18 LAB — THYROID PEROXIDASE ANTIBODIES (TPO) (REFL): Thyroperoxidase Ab SerPl-aCnc: 10 [IU]/mL — ABNORMAL HIGH (ref <9)

## 2023-01-21 DIAGNOSIS — R42 Dizziness and giddiness: Secondary | ICD-10-CM | POA: Insufficient documentation

## 2023-01-21 DIAGNOSIS — R944 Abnormal results of kidney function studies: Secondary | ICD-10-CM | POA: Insufficient documentation

## 2023-01-21 DIAGNOSIS — K146 Glossodynia: Secondary | ICD-10-CM | POA: Insufficient documentation

## 2023-01-21 NOTE — Assessment & Plan Note (Signed)
Repeat bmp pending results.  Will monitor with metformin if we need to consider d/c

## 2023-01-21 NOTE — Assessment & Plan Note (Signed)
Reviewed hospital f/u will order cbc b12 to r/o ddx anemia vs b12 def

## 2023-01-21 NOTE — Assessment & Plan Note (Signed)
Repeat tsh with tpo  Pending results.

## 2023-01-21 NOTE — Assessment & Plan Note (Signed)
Continue keflex as prescribed.  Monitor for return of symptoms, and for red flag symptoms such as vomiting and or fever.

## 2023-01-21 NOTE — Assessment & Plan Note (Signed)
Could be multifactoral Handout given to pt on burning mouth syndrome B12 adequately replenished

## 2023-01-22 ENCOUNTER — Telehealth: Payer: Self-pay | Admitting: Neurology

## 2023-01-22 NOTE — Telephone Encounter (Signed)
sent to [facility] they obtain [employer] medicare auth [id number] 

## 2023-01-24 ENCOUNTER — Telehealth: Payer: Self-pay | Admitting: Family

## 2023-01-24 ENCOUNTER — Encounter: Payer: Self-pay | Admitting: Family

## 2023-01-24 ENCOUNTER — Ambulatory Visit (INDEPENDENT_AMBULATORY_CARE_PROVIDER_SITE_OTHER): Payer: Medicare HMO | Admitting: Family

## 2023-01-24 VITALS — BP 84/58 | HR 70 | Temp 97.8°F | Ht 64.0 in | Wt 177.0 lb

## 2023-01-24 DIAGNOSIS — B3731 Acute candidiasis of vulva and vagina: Secondary | ICD-10-CM

## 2023-01-24 DIAGNOSIS — I951 Orthostatic hypotension: Secondary | ICD-10-CM

## 2023-01-24 DIAGNOSIS — Z87898 Personal history of other specified conditions: Secondary | ICD-10-CM | POA: Diagnosis not present

## 2023-01-24 DIAGNOSIS — R42 Dizziness and giddiness: Secondary | ICD-10-CM | POA: Diagnosis not present

## 2023-01-24 DIAGNOSIS — N3000 Acute cystitis without hematuria: Secondary | ICD-10-CM

## 2023-01-24 MED ORDER — FLUCONAZOLE 150 MG PO TABS
ORAL_TABLET | ORAL | 0 refills | Status: DC
Start: 2023-01-24 — End: 2023-03-26

## 2023-01-24 NOTE — Telephone Encounter (Signed)
Patient's husband wanted Frances Hoffman to be aware that patient is taking medication fludrocortisone (FLORINEF) 0.1 MG tablet

## 2023-01-24 NOTE — Progress Notes (Signed)
Established Patient Office Visit  Subjective:      CC:  Chief Complaint  Patient presents with   Follow-up    Dizziness - still present but is better since last OV    HPI: Frances Hoffman is a 82 y.o. female presenting on 01/24/2023 for Follow-up (Dizziness - still present but is better since last OV) . F/u for recent discharge from hospital 11/9 for syncopal event, here to f/u on how she is feeling   UTI, completed keflex regimen. 1/14 with good kidney function/stable at 46.35.   Thyroid disease: very slight elevation of TPO, suspected hashimotos.   New complaints: Still with dizziness ongoing and still with low blood pressure.  Was prescribed florinef last visit. She has increased her water intake over the last one week to at least 4-8 oz glasses which is an increase from her previous 1 glass daily. She denies urinary frequency, urgency and or dysuria.   Also curious if I could give her a sleeping pill that she can take prn. Takes tylenol PM, she does state this is helpful at night time to help her fall to sleep and will sleep on average for about four hours.  She is being followed by neurology as well, order for VAS US carotid and echo in place, 02/13/23. Pending results.     Social history:  Relevant past medical, surgical, family and social history reviewed and updated as indicated. Interim medical history since our last visit reviewed.  Allergies and medications reviewed and updated.  DATA REVIEWED: CHART IN EPIC     ROS: Negative unless specifically indicated above in HPI.    Current Outpatient Medications:    acetaminophen (TYLENOL) 500 MG tablet, Take 500 mg by mouth every 6 (six) hours as needed., Disp: , Rfl:    aspirin-sod bicarb-citric acid (ALKA-SELTZER) 325 MG TBEF tablet, Take 325 mg by mouth every 6 (six) hours as needed., Disp: , Rfl:    atorvastatin (LIPITOR) 40 MG tablet, Take 1 tablet (40 mg total) by mouth daily. For cholesterol., Disp: 90  tablet, Rfl: 4   calcium carbonate (OS-CAL - DOSED IN MG OF ELEMENTAL CALCIUM) 1250 (500 CA) MG tablet, Take 1 tablet by mouth 2 (two) times daily with a meal., Disp: , Rfl:    carboxymethylcellulose (REFRESH PLUS) 0.5 % SOLN, Place 1 drop into both eyes 3 (three) times daily as needed (for dryness)., Disp: , Rfl:    cephALEXin (KEFLEX) 500 MG capsule, Take 1 capsule (500 mg total) by mouth 3 (three) times daily., Disp: 21 capsule, Rfl: 0   Cholecalciferol (VITAMIN D3) 2000 UNITS capsule, Take 2,000 Units by mouth daily., Disp: , Rfl:    Cinnamon 500 MG TABS, Take 500 mg by mouth daily., Disp: , Rfl:    cyanocobalamin ([employer]S VITAMIN B12) 2000 MCG tablet, Take 1 tablet (2,000 mcg total) by mouth daily., Disp: , Rfl:    escitalopram (LEXAPRO) 20 MG tablet, Take 1 tablet (20 mg total) by mouth daily., Disp: 90 tablet, Rfl: 4   fluconazole (DIFLUCAN) 150 MG tablet, Take one po every day for one dose, repeat again in three days if still with symptoms, Disp: 2 tablet, Rfl: 0   fludrocortisone (FLORINEF) 0.1 MG tablet, Take 0.5 tablets (0.05 mg total) by mouth daily., Disp: 30 tablet, Rfl: 3   levothyroxine (SYNTHROID) 88 MCG tablet, Take 1 tablet (88 mcg total) by mouth daily., Disp: 90 tablet, Rfl: 3   meclizine (ANTIVERT) 50 MG tablet, Take 1 tablet (50 mg  total) by mouth 3 (three) times daily as needed., Disp: 30 tablet, Rfl: 0   memantine (NAMENDA) 10 MG tablet, Take 1 tablet (10 mg total) by mouth 2 (two) times daily., Disp: 180 tablet, Rfl: 3   metFORMIN (GLUCOPHAGE) 500 MG tablet, Take 1 tablet (500 mg total) by mouth daily with breakfast., Disp: 30 tablet, Rfl: 0   METHYLCELLULOSE OP, Take 500 mg by mouth daily., Disp: , Rfl:    omeprazole (PRILOSEC) 20 MG capsule, Take 20 mg by mouth daily., Disp: , Rfl:       Objective:    BP (!) 84/58 (BP Location: Left Arm, Patient Position: Sitting, Cuff Size: Normal)   Pulse 70   Temp 97.8 F (36.6 C) (Temporal)   Ht 5\' 4"  (1.626 m)   Wt 177 lb (80.3  kg)   SpO2 96%   BMI 30.38 kg/m   Wt Readings from Last 3 Encounters:  01/24/23 177 lb (80.3 kg)  01/17/23 177 lb 3.2 oz (80.4 kg)  01/17/23 176 lb 3.2 oz (79.9 kg)    Physical Exam Constitutional:      General: She is not in acute distress.    Appearance: Normal appearance. She is normal weight. She is not ill-appearing, toxic-appearing or diaphoretic.  HENT:     Head: Normocephalic.  Cardiovascular:     Rate and Rhythm: Normal rate.  Pulmonary:     Effort: Pulmonary effort is normal.  Musculoskeletal:        General: Normal range of motion.  Neurological:     General: No focal deficit present.     Mental Status: She is alert and oriented to person, place, and time. Mental status is at baseline.     Cranial Nerves: No facial asymmetry.     Motor: Motor function is intact. No weakness.     Coordination: Coordination is intact. Coordination normal.     Gait: Gait is intact.  Psychiatric:        Mood and Affect: Mood normal.        Behavior: Behavior normal.        Thought Content: Thought content normal.        Cognition and Memory: She exhibits impaired recent memory.        Judgment: Judgment normal.           Assessment & Plan:  Candida vaginitis -     Fluconazole; Take one po every day for one dose, repeat again in three days if still with symptoms  Dispense: 2 tablet; Refill: 0  Orthostatic hypotension -     Ambulatory referral to Cardiology  Dizziness Assessment & Plan: Ongoing, no real change.  Still with low blood pressure.  Husband and wife unsure if she has started florinef, they will go home and check the bottle and let me know.  Discussion with patient to increase water intake even more, goal 60-90 oz water daily.  Labs reviewed, unremarkable for dizziness.  Placing referral for cardiology as well for workup.  Maintain appts for echo and vascular u/s as scheduled.  Rise slowly upon standing.    Orders: -     Ambulatory referral to  Cardiology  History of syncope -     Ambulatory referral to Cardiology  Acute cystitis without hematuria -     Urine Culture    Will repeat urine today for culture to ensure that infection has resolved.  Return in about 3 months (around 04/26/2023).  [name], MSN, APRN, FNP-C [facility]  [facility]

## 2023-01-24 NOTE — Assessment & Plan Note (Addendum)
Ongoing, no real change.  Still with low blood pressure.  Husband and wife unsure if she has started florinef, they will go home and check the bottle and let me know.  Discussion with patient to increase water intake even more, goal 60-90 oz water daily.  Labs reviewed, unremarkable for dizziness.  Placing referral for cardiology as well for workup.  Maintain appts for echo and vascular u/s as scheduled.  Rise slowly upon standing.

## 2023-01-25 NOTE — Telephone Encounter (Signed)
Thank you for the update. Noted.

## 2023-01-28 ENCOUNTER — Ambulatory Visit (INDEPENDENT_AMBULATORY_CARE_PROVIDER_SITE_OTHER): Payer: Medicare HMO

## 2023-01-28 VITALS — Ht 64.0 in | Wt 177.0 lb

## 2023-01-28 DIAGNOSIS — Z0000 Encounter for general adult medical examination without abnormal findings: Secondary | ICD-10-CM

## 2023-01-28 NOTE — Patient Instructions (Signed)
Ms. Dunavan , Thank you for taking time to come for your Medicare Wellness Visit. I appreciate your ongoing commitment to your health goals. Please review the following plan we discussed and let me know if I can assist you in the future.   Referrals/Orders/Follow-Ups/Clinician Recommendations: none  This is a list of the screening recommended for you and due dates:  Health Maintenance  Topic Date Due   COVID-19 Vaccine (6 - 2023-24 season) 11/04/2022   Flu Shot  06/03/2023*   Mammogram  08/17/2023   Medicare Annual Wellness Visit  01/28/2024   DTaP/Tdap/Td vaccine (4 - Td or Tdap) 01/23/2032   Pneumonia Vaccine  Completed   DEXA scan (bone density measurement)  Completed   Zoster (Shingles) Vaccine  Completed   HPV Vaccine  Aged Out   Hepatitis C Screening  Discontinued  *Topic was postponed. The date shown is not the original due date.    Advanced directives: (Copy Requested) Please bring a copy of your health care power of attorney and living will to the office to be added to your chart at your convenience.  Next Medicare Annual Wellness Visit scheduled for next year: Yes 01/31/2024 @ 10:10am telephone

## 2023-01-28 NOTE — Progress Notes (Signed)
Subjective:   Frances Hoffman is a 82 y.o. female who presents for Medicare Annual (Subsequent) preventive examination.  Visit Complete: Virtual I connected with  Frances Hoffman on 01/28/23 by a audio enabled telemedicine application and verified that I am speaking with the correct person using two identifiers.  Patient Location: Home  Provider Location: Home Office  I discussed the limitations of evaluation and management by telemedicine. The patient expressed understanding and agreed to proceed.  Vital Signs: Because this visit was a virtual/telehealth visit, some criteria may be missing or patient reported. Any vitals not documented were not able to be obtained and vitals that have been documented are patient reported.  Patient Medicare AWV questionnaire was completed by the patient on (not done); I have confirmed that all information answered by patient is correct and no changes since this date.  Cardiac Risk Factors include: advanced age (>16men, >73 women);dyslipidemia;obesity (BMI >30kg/m2)    Objective:    Today's Vitals   01/28/23 1153 01/28/23 1154  Weight: 177 lb (80.3 kg)   Height: 5\' 4"  (1.626 m)   PainSc:  2    Body mass index is 30.38 kg/m.     01/28/2023   12:06 PM 01/12/2023    2:32 PM 01/24/2022    9:12 AM 04/17/2021    4:07 PM 04/17/2021   10:38 AM 01/23/2021    9:58 AM 01/21/2020   10:35 AM  Advanced Directives  Does Patient Have a Medical Advance Directive? Yes No No Yes No Yes Yes  Type of Estate agent of [place];Living will   Healthcare Power of [place];Living will  Healthcare Power of [place];Living will Healthcare Power of Attorney  Does patient want to make changes to medical advance directive?    No - Patient declined  Yes (MAU/Ambulatory/Procedural Areas - Information given)   Copy of Healthcare Power of Attorney in Chart? No - copy requested   No - copy requested   No - copy requested  Would patient like information on  creating a medical advance directive?   No - Patient declined        Current Medications (verified) Outpatient Encounter Medications as of 01/28/2023  Medication Sig   acetaminophen (TYLENOL) 500 MG tablet Take 500 mg by mouth every 6 (six) hours as needed.   aspirin-sod bicarb-citric acid (ALKA-SELTZER) 325 MG TBEF tablet Take 325 mg by mouth every 6 (six) hours as needed.   atorvastatin (LIPITOR) 40 MG tablet Take 1 tablet (40 mg total) by mouth daily. For cholesterol.   calcium carbonate (OS-CAL - DOSED IN MG OF ELEMENTAL CALCIUM) 1250 (500 CA) MG tablet Take 1 tablet by mouth 2 (two) times daily with a meal.   carboxymethylcellulose (REFRESH PLUS) 0.5 % SOLN Place 1 drop into both eyes 3 (three) times daily as needed (for dryness).   cephALEXin (KEFLEX) 500 MG capsule Take 1 capsule (500 mg total) by mouth 3 (three) times daily.   Cholecalciferol (VITAMIN D3) 2000 UNITS capsule Take 2,000 Units by mouth daily.   Cinnamon 500 MG TABS Take 500 mg by mouth daily.   cyanocobalamin ([employer] VITAMIN B12) 2000 MCG tablet Take 1 tablet (2,000 mcg total) by mouth daily.   escitalopram (LEXAPRO) 20 MG tablet Take 1 tablet (20 mg total) by mouth daily.   fluconazole (DIFLUCAN) 150 MG tablet Take one po every day for one dose, repeat again in three days if still with symptoms   fludrocortisone (FLORINEF) 0.1 MG tablet Take 0.5 tablets (0.05 mg  total) by mouth daily.   levothyroxine (SYNTHROID) 88 MCG tablet Take 1 tablet (88 mcg total) by mouth daily.   meclizine (ANTIVERT) 50 MG tablet Take 1 tablet (50 mg total) by mouth 3 (three) times daily as needed.   memantine (NAMENDA) 10 MG tablet Take 1 tablet (10 mg total) by mouth 2 (two) times daily.   metFORMIN (GLUCOPHAGE) 500 MG tablet Take 1 tablet (500 mg total) by mouth daily with breakfast.   METHYLCELLULOSE OP Take 500 mg by mouth daily.   omeprazole (PRILOSEC) 20 MG capsule Take 20 mg by mouth daily.   No facility-administered encounter medications  on file as of 01/28/2023.    Allergies (verified) Ciprofloxacin, Citalopram hydrobromide, Flagyl [metronidazole], Milk-related compounds, and Oxycodone   History: Past Medical History:  Diagnosis Date   Adjustment disorder with mixed anxiety and depressed mood 12/12/2007   Qualifier: Diagnosis of  By: Copland MD, Frances     B12 deficiency    Breast cancer (HCC)    Cancer (HCC) 2000   breast had lumpectomy/radiation x36,mammo ,no chemo   Cataract of both eyes    Depression    Dysuria-frequency syndrome    takes AZO   Family history of heart disease 08/27/2013   Frozen shoulder    Gall stones    GERD 12/12/2007   Qualifier: Diagnosis of  By: Copland MD, Frances     Hyperlipidemia    Hypothyroidism 12/12/2007   Qualifier: Diagnosis of  By: Frances Lager MD, Frances     Lactose intolerance    Memory difficulties 09/27/2017   Memory loss    Memory loss 09/22/2014   Osteopenia    BMD 2004, WNL 2008   Personal history of radiation therapy    Pneumonia    Recurrent UTI    Shingles    chronic body pain, left side of body   Shortness of breath dyspnea    when climbing stairs only   Vitamin D deficiency    Wears glasses    Past Surgical History:  Procedure Laterality Date   BREAST LUMPECTOMY Left 2000   CHOLECYSTECTOMY N/A 02/21/2015   Procedure: LAPAROSCOPIC CHOLECYSTECTOMY WITH INTRAOPERATIVE CHOLANGIOGRAM;  Surgeon: Frances Adu, MD;  Location: [facility] OR;  Service: General;  Laterality: N/A;   COLONOSCOPY     MULTIPLE TOOTH EXTRACTIONS     Family History  Problem Relation Age of Onset   Osteoporosis Mother    Hypertension Mother    Sleep apnea Mother    Obesity Mother    Parkinsonism Father    Breast cancer Sister 95   Coronary artery disease Sister    Ovarian cancer Sister    Heart disease Son    Heart attack Son 42   Heart disease Maternal Grandmother    Heart disease Maternal Grandfather    Heart attack Maternal Grandfather 70   Breast cancer Other    Social  History   Socioeconomic History   Marital status: Married    Spouse name: Frances Hoffman    Number of children: 2   Years of education: High school   Highest education level: High school graduate  Occupational History   Not on file  Tobacco Use   Smoking status: Never   Smokeless tobacco: Never  Vaping Use   Vaping status: Never Used  Substance and Sexual Activity   Alcohol use: No    Alcohol/week: 0.0 standard drinks of alcohol   Drug use: No   Sexual activity: Yes    Partners: Male    Birth  control/protection: Post-menopausal  Other Topics Concern   Not on file  Social History Narrative   Lives at home with her husband. Frances Hoffman   2 adult sons  - [place] and Minerva Areola   2 Grandchildren - both other   Exercise: goes to [employer] - at least 2 times a week   Diet: anything that tastes good, less pepsi's with the tongue   Enjoys: square dance, eating out with friends, going to church - Sunday and Wednesday   Social Determinants of Health   Financial Resource Strain: Low Risk  (01/28/2023)   Overall Financial Resource Strain (CARDIA)    Difficulty of Paying Living Expenses: Not hard at all  Food Insecurity: No Food Insecurity (01/28/2023)   Hunger Vital Sign    Worried About Running Out of Food in the Last Year: Never true    Ran Out of Food in the Last Year: Never true  Transportation Needs: No Transportation Needs (01/28/2023)   PRAPARE - Administrator, Civil Service (Medical): No    Lack of Transportation (Non-Medical): No  Physical Activity: Insufficiently Active (01/28/2023)   Exercise Vital Sign    Days of Exercise per Week: 3 days    Minutes of Exercise per Session: 10 min  Stress: No Stress Concern Present (01/28/2023)   [employer] of Occupational Health - Occupational Stress Questionnaire    Feeling of Stress : Not at all  Social Connections: Moderately Integrated (01/28/2023)   Social Connection and Isolation Panel [NHANES]    Frequency of  Communication with Friends and Family: More than three times a week    Frequency of Social Gatherings with Friends and Family: Once a week    Attends Religious Services: More than 4 times per year    Active Member of [employer] or Organizations: No    Attends Engineer, structural: Never    Marital Status: Married    Tobacco Counseling Counseling given: Not Answered   Clinical Intake:  Pre-visit preparation completed: Yes  Pain : No/denies pain Pain Score: 2  Pain Location: Hand Pain Descriptors / Indicators: Aching     BMI - recorded: 30.38 Nutritional Status: BMI > 30  Obese Nutritional Risks: None Diabetes: No  How often do you need to have someone help you when you read instructions, pamphlets, or other written materials from your doctor or pharmacy?: 1 - Never  Interpreter Needed?: No  Comments: lives with husband Information entered by :: B.Antinio Sanderfer,LPN   Activities of Daily Living    01/28/2023   12:07 PM  In your present state of health, do you have any difficulty performing the following activities:  Hearing? 0  Vision? 0  Difficulty concentrating or making decisions? 1  Walking or climbing stairs? 0  Dressing or bathing? 0  Doing errands, shopping? 1  Preparing Food and eating ? N  Using the Toilet? N  In the past six months, have you accidently leaked urine? N  Do you have problems with loss of bowel control? N  Managing your Medications? N  Managing your Finances? N  Housekeeping or managing your Housekeeping? N    Patient Care Team: [name], FNP as PCP - General (Family Medicine) [facility]  Indicate any recent Medical Services you may have received from other than [facility] providers in the past year (date may be approximate).     Assessment:   This is a routine wellness examination for Frances Hoffman.  Hearing/Vision screen Hearing Screening - Comments:: Pt  says hearing is fine Vision Screening - Comments:: Pt says  vision fine with glasses Dr Dione Booze   Goals Addressed             This Visit's Progress    DIET - EAT MORE FRUITS AND VEGETABLES   Not on track    Patient Stated   On track    01/08/2019, I will maintain and continue medications as prescribed.      Patient Stated   Not on track    Would like to try and lose weight.        Depression Screen    01/28/2023   12:00 PM 01/24/2023   10:41 AM 01/17/2023   11:21 AM 08/22/2022   10:26 AM 08/17/2022    2:09 PM 01/24/2022    9:10 AM 06/14/2021    3:20 PM  PHQ 2/9 Scores  PHQ - 2 Score 2 3 4 2 2 1  0  PHQ- 9 Score 3 6 12 7 7 1      Fall Risk    01/28/2023   11:57 AM 01/24/2023   10:41 AM 01/17/2023   11:20 AM 08/22/2022   10:26 AM 08/17/2022    2:09 PM  Fall Risk   Falls in the past year? 1 1 1  0 0  Number falls in past yr: 0 0 0 0 0  Injury with Fall? 0 0 0 0 0  Risk for fall due to : No Fall Risks History of fall(s) History of fall(s) No Fall Risks No Fall Risks  Follow up Falls prevention discussed;Education provided Falls evaluation completed Falls evaluation completed Falls evaluation completed Falls evaluation completed    MEDICARE RISK AT HOME: Medicare Risk at Home Any stairs in or around the home?: Yes If so, are there any without handrails?: Yes Home free of loose throw rugs in walkways, pet beds, electrical cords, etc?: Yes Adequate lighting in your home to reduce risk of falls?: Yes Life alert?: No Use of a cane, walker or w/c?: No Grab bars in the bathroom?: Yes Shower chair or bench in shower?: No Elevated toilet seat or a handicapped toilet?: No  TIMED UP AND GO:  Was the test performed?  No    Cognitive Function:    01/08/2019   11:21 AM 03/28/2017    7:50 AM 03/26/2017   10:47 AM 09/21/2015    2:16 PM  MMSE - Mini Mental State Exam  Orientation to time 5 4 5 4   Orientation to Place 5 5 5 5   Registration 3 3 3 3   Attention/ Calculation 5 5 2 5   Recall 3 2 1 2   Language- name 2 objects  2 2 2    Language- repeat 1 1 1 1   Language- follow 3 step command  3 3 3   Language- read & follow direction  1 1 1   Write a sentence  1 1 1   Copy design  1 1 0  Total score  28 25 27       01/17/2023    8:16 AM 01/04/2022    1:40 PM 11/24/2020   10:55 AM 08/23/2020    3:21 PM  Montreal Cognitive Assessment   Visuospatial/ Executive (0/5) 2 4 5 1   Naming (0/3) 2 2 3 2   Attention: Read list of digits (0/2) 1 1 2 2   Attention: Read list of letters (0/1) 1 1 1 1   Attention: Serial 7 subtraction starting at 100 (0/3) 0 3 3 1   Language: Repeat phrase (0/2) 1 2 1  2  Language : Fluency (0/1) 0 0 1 0  Abstraction (0/2) 1 2 2 2   Delayed Recall (0/5) 2 2 1 2   Orientation (0/6) 5 6 6 6   Total 15 23 25 19   Adjusted Score (based on education)  23 25       01/28/2023   12:08 PM 01/24/2022    9:16 AM 03/26/2017   10:44 AM 11/14/2016   10:13 AM  6CIT Screen  What Year? 0 points 0 points 0 points 0 points  What month? 0 points 0 points 0 points 0 points  What time? 0 points 0 points 0 points 0 points  Count back from 20 0 points 0 points 0 points 0 points  Months in reverse 4 points  4 points 0 points  Repeat phrase 6 points 8 points 6 points 2 points  Total Score 10 points  10 points 2 points    Immunizations Immunization History  Administered Date(s) Administered   Fluad Quad(high Dose 65+) 12/15/2019, 11/01/2021   Influenza Split 01/04/2011   Influenza Whole 01/03/2009, 12/06/2009   Influenza, High Dose Seasonal PF 12/11/2017, 11/27/2018   Influenza,inj,Quad PF,6+ Mos 12/02/2012, 11/30/2014, 11/30/2015   Influenza-Unspecified 12/04/2013, 12/09/2020   PFIZER Comirnaty(Gray Top)Covid-19 Tri-Sucrose Vaccine 11/01/2021   PFIZER(Purple Top)SARS-COV-2 Vaccination 03/24/2019, 04/14/2019, 12/15/2019, 12/09/2020   PNEUMOCOCCAL CONJUGATE-20 11/22/2021   PPD Test 12/31/2017   Pneumococcal Conjugate-13 04/28/2014   Pneumococcal Polysaccharide-23 11/26/2008   Respiratory Syncytial Virus  Vaccine,Recomb Aduvanted(Arexvy) 11/01/2021   Td 03/05/2006   Tdap 11/06/2016, 01/22/2022   Zoster Recombinant(Shingrix) 01/16/2019, 05/01/2019   Zoster, Live 04/05/2013    TDAP status: Up to date  Flu Vaccine status: Up to date  Pneumococcal vaccine status: Up to date  Covid-19 vaccine status: Completed vaccines  Qualifies for Shingles Vaccine? Yes   Zostavax completed Yes   Shingrix Completed?: Yes  Screening Tests Health Maintenance  Topic Date Due   COVID-19 Vaccine (6 - 2023-24 season) 02/13/2023 (Originally 11/04/2022)   INFLUENZA VACCINE  06/03/2023 (Originally 10/04/2022)   MAMMOGRAM  08/17/2023   Medicare Annual Wellness (AWV)  01/28/2024   DTaP/Tdap/Td (4 - Td or Tdap) 01/23/2032   Pneumonia Vaccine 73+ Years old  Completed   DEXA SCAN  Completed   Zoster Vaccines- Shingrix  Completed   HPV VACCINES  Aged Out   Hepatitis C Screening  Discontinued    Health Maintenance  There are no preventive care reminders to display for this patient.   Colorectal cancer screening: No longer required.   Mammogram status: No longer required due to age.  Bone Density status: Completed 02/20/2021. Results reflect: Bone density results: NORMAL. Repeat every 5 years.  Lung Cancer Screening: (Low Dose CT Chest recommended if Age 7-80 years, 20 pack-year currently smoking OR have quit w/in 15years.) does not qualify.   Lung Cancer Screening Referral: no  Additional Screening:  Hepatitis C Screening: does not qualify; Completed 12/31/2017  Vision Screening: Recommended annual ophthalmology exams for early detection of glaucoma and other disorders of the eye. Is the patient up to date with their annual eye exam?  Yes  Who is the provider or what is the name of the office in which the patient attends annual eye exams? Dr Dione Booze If pt is not established with a provider, would they like to be referred to a provider to establish care? No .   Dental Screening: Recommended annual  dental exams for proper oral hygiene  Diabetic Foot Exam: n/a  Community Resource Referral / Chronic Care Management: CRR required this  visit?  No   CCM required this visit?  No     Plan:     I have personally reviewed and noted the following in the patient's chart:   Medical and social history Use of alcohol, tobacco or illicit drugs  Current medications and supplements including opioid prescriptions. Patient is not currently taking opioid prescriptions. Functional ability and status Nutritional status Physical activity Advanced directives List of other physicians Hospitalizations, surgeries, and ER visits in previous 12 months Vitals Screenings to include cognitive, depression, and falls Referrals and appointments  In addition, I have reviewed and discussed with patient certain preventive protocols, quality metrics, and best practice recommendations. A written personalized care plan for preventive services as well as general preventive health recommendations were provided to patient.     Sue Lush, LPN   [id number]   After Visit Summary: (MyChart) Due to this being a telephonic visit, the after visit summary with patients personalized plan was offered to patient via MyChart   Nurse Notes: The patient states she is doing well and has no concerns or questions at this time.

## 2023-01-29 ENCOUNTER — Other Ambulatory Visit: Payer: Self-pay | Admitting: Family

## 2023-01-29 ENCOUNTER — Encounter (INDEPENDENT_AMBULATORY_CARE_PROVIDER_SITE_OTHER): Payer: Self-pay | Admitting: Physician Assistant

## 2023-01-29 ENCOUNTER — Ambulatory Visit (INDEPENDENT_AMBULATORY_CARE_PROVIDER_SITE_OTHER): Payer: Medicare HMO | Admitting: Physician Assistant

## 2023-01-29 VITALS — BP 90/60 | HR 93 | Temp 98.4°F | Ht 64.0 in | Wt 172.0 lb

## 2023-01-29 DIAGNOSIS — R7303 Prediabetes: Secondary | ICD-10-CM | POA: Diagnosis not present

## 2023-01-29 DIAGNOSIS — N390 Urinary tract infection, site not specified: Secondary | ICD-10-CM

## 2023-01-29 DIAGNOSIS — Z6829 Body mass index (BMI) 29.0-29.9, adult: Secondary | ICD-10-CM | POA: Diagnosis not present

## 2023-01-29 DIAGNOSIS — E669 Obesity, unspecified: Secondary | ICD-10-CM | POA: Diagnosis not present

## 2023-01-29 DIAGNOSIS — E785 Hyperlipidemia, unspecified: Secondary | ICD-10-CM

## 2023-01-29 DIAGNOSIS — E7849 Other hyperlipidemia: Secondary | ICD-10-CM

## 2023-01-29 LAB — URINE CULTURE
MICRO NUMBER:: 15763016
SPECIMEN QUALITY:: ADEQUATE

## 2023-01-29 MED ORDER — NITROFURANTOIN MONOHYD MACRO 100 MG PO CAPS: 100.0000 mg | ORAL_CAPSULE | Freq: Two times a day (BID) | ORAL | 0 refills | Status: AC

## 2023-01-29 MED ORDER — METFORMIN HCL 500 MG PO TABS: 500.0000 mg | ORAL_TABLET | Freq: Every day | ORAL | 1 refills | Status: DC

## 2023-01-29 NOTE — Progress Notes (Signed)
SUBJECTIVE:  Chief Complaint: Obesity  Interim History: Frances Hoffman, an 82 year old female here with her husband today, with a history of obesity, hyperlipidemia, and prediabetes, presents for a follow-up visit regarding her obesity treatment plan. She has been prescribed Metformin 500mg  daily for prediabetes and Lipitor 40mg  daily for hyperlipidemia.  She is down 1 lb since last visit.   The patient admits to a lack of dietary restrictions, often choosing foods based on convenience and taste rather than nutritional value. She frequently dines out, opting for meals such as hot ham and cheese subs, and expresses a dislike for fish, a common source of lean protein. She also reports a daily consumption of a small can of regular soda, which she is reluctant to give up despite its high sugar content. We discussed how these choices can impact her ability to promote weight loss.   In addition to these dietary habits, the patient has a preference for sweet foods, including sweet potatoes with brown sugar and cinnamon, and enjoys a daily treat of a small can of regular soda. She expresses a dislike for diet drinks and is resistant to the idea of giving up her daily soda.  The patient is currently taking Metformin for prediabetes and reports no adverse effects. She is due for a refill of this medication.  The patient's spouse is present during the consultation and provides additional insight into her dietary habits. They report that the patient often removes part of the bread when eating subs and occasionally consumes flavored water in addition to her daily soda. The spouse also notes that they try to support the patient's dietary efforts by avoiding certain foods in her presence.  Despite these challenges, the patient has shown some willingness to make dietary changes. She expresses interest in trying new foods and beverages, such as flavored carbonated water and frozen [place] yogurt bars, as  alternatives to her current choices. She also shows a willingness to try portion control techniques, such as ensuring her meal sides do not touch each other on the plate.  In summary, this is an 82 year old individual with obesity, hyperlipidemia, and prediabetes, struggling with dietary modifications necessary for weight loss and better metabolic control. She demonstrates some willingness to make changes but faces challenges in giving up certain preferred foods and drinks.  Frances Hoffman is here to discuss her progress with her obesity treatment plan. She is on the keeping a food journal and adhering to recommended goals of 1000-1200 calories and 75 grams of  protein and states she is following her eating plan approximately 90 % of the time. She states she is exercising/exercise bike 10 minutes 4 times per week.   OBJECTIVE: Visit Diagnoses: Problem List Items Addressed This Visit     Hyperlipidemia   Prediabetes - Primary   Relevant Medications   metFORMIN (GLUCOPHAGE) 500 MG tablet   Obesity, Beginning BMI 32.40   Relevant Medications   metFORMIN (GLUCOPHAGE) 500 MG tablet  Obesity Down a total of 8 lbs since 10/30/22 TBW loss of 38.66% 82 year old presenting for follow-up. Lost one pound since last visit. Reports lack of structured activities and frequent dining out. Discussed reducing carbohydrate intake, avoiding sugary beverages, and increasing protein consumption. Dislikes fish but open to chicken, ham, and pork. Suggested flavored carbonated water and healthier snacks like [place] yogurt bars and string cheese. Cited potential weight loss benefits of eliminating sugary beverages. Provided 100 calorie snack hand out for better snack options.  - Refill metformin 500 mg daily - Encourage  reduction of carbohydrate intake - Advise elimination of sugary beverages - Suggest trying flavored carbonated water - Recommend healthier snack options like [place] yogurt bars and string cheese - Schedule  follow-up with Dr. Dalbert Garnet on December 30th at 2:40 PM  Prediabetes Lab Results  Component Value Date   HGBA1C 5.8 (H) 12/20/2022    On metformin 500 mg daily. Needs refill. No gastrointestinal upset reported. Discussed dietary modifications for weight loss and glycemic control. - Refill/continue metformin 500 mg daily Continue working on nutrition plan to decrease simple carbohydrates, increase lean proteins and exercise to promote weight loss, improve glycemic control and prevent progression to Type 2 diabetes.  - Encourage dietary modifications for weight loss and glycemic control  Hyperlipidemia LDL is not at goal. Medication(s): Lipitor 40 mg daily- Reports no side effects Cardiovascular risk factors: advanced age (older than 46 for men, 37 for women), dyslipidemia, obesity (BMI >= 30 kg/m2), and sedentary lifestyle  Lab Results  Component Value Date   CHOL 170 11/28/2022   HDL 38 (L) 11/28/2022   LDLCALC 103 (H) 11/28/2022   LDLDIRECT 92.0 04/20/2014   TRIG 165 (H) 11/28/2022   CHOLHDL 4 02/15/2022   CHOLHDL 4 05/02/2021   CHOLHDL 4 02/16/2021   Lab Results  Component Value Date   ALT 22 01/12/2023   AST 25 01/12/2023   ALKPHOS 99 01/12/2023   BILITOT 0.7 01/12/2023   The ASCVD Risk score (Arnett DK, et al., 2019) failed to calculate for the following reasons:   The 2019 ASCVD risk score is only valid for ages 23 to 2  Plan: Continue statin. Continue to work on nutrition plan -decreasing simple carbohydrates, increasing lean proteins, decreasing saturated fats and cholesterol , avoiding trans fats and exercise as able to promote weight loss, improve lipids and decrease cardiovascular risks.   General Health Maintenance Provided dietary and lifestyle recommendations. Advised on portion control for holiday meals and increasing protein intake. Recommended Fairlife milk for higher protein and lower sugar intake. - Encourage portion control during holiday meals -  Advise on increasing protein intake - Recommend Fairlife milk for higher protein and lower sugar intake.  Vitals Temp: 98.4 F (36.9 C) BP: 90/60 Pulse Rate: 93 SpO2: 94 %   Anthropometric Measurements Height: 5\' 4"  (1.626 m) Weight: 172 lb (78 kg) BMI (Calculated): 29.51 Weight at Last Visit: 173 lb Weight Lost Since Last Visit: 1 lb Weight Gained Since Last Visit: 0 Starting Weight: 180 lb Total Weight Loss (lbs): 8 lb (3.629 kg)   Body Composition  Body Fat %: 44.5 % Fat Mass (lbs): 76.8 lbs Muscle Mass (lbs): 91.2 lbs Total Body Water (lbs): 64.8 lbs Visceral Fat Rating : 14   Other Clinical Data Fasting: no Labs: no Today's Visit #: 6 Starting Date: 10/30/22     ASSESSMENT AND PLAN:  Diet: Frances Hoffman is currently in the action stage of change. As such, her goal is to continue with weight loss efforts. She has agreed to Category 1 Plan and keeping a food journal and adhering to recommended goals of 1000-1200 calories and 75 grams of protein.  Exercise: Frances Hoffman has been instructed to try a geriatric exercise plan and that some exercise is better than none for weight loss and overall health benefits.   Behavior Modification:  We discussed the following Behavioral Modification Strategies today: increasing lean protein intake, decreasing simple carbohydrates, increasing vegetables, increase H2O intake, increase high fiber foods, decreasing eating out, ways to avoid boredom eating, holiday eating strategies, and avoiding  temptations. We discussed various medication options to help Frances Hoffman with her weight loss efforts and we both agreed to continue metformin for prediabetes and continue to work on nutritional and behavioral strategies to promote weight loss.    Return in about 5 weeks (around 03/05/2023).[name] She was informed of the importance of frequent follow up visits to maximize her success with intensive lifestyle modifications for her multiple health  conditions.  Attestation Statements:   Reviewed by clinician on day of visit: allergies, medications, problem list, medical history, surgical history, family history, social history, and previous encounter notes.   Time spent on visit including pre-visit chart review and post-visit care and charting was 40 minutes.    Lamya Lausch, PA-C

## 2023-01-29 NOTE — Progress Notes (Signed)
Please call rather than mychart.  Urine again positive for infection.  Going to proceed with another antibiotic called macrobid.  Take with lots of water.  Any side effects or changes let us know immediately.   Does she see a urologist? If no I am going to refer her to one for reoccurance.

## 2023-01-29 NOTE — Progress Notes (Signed)
Due to treatment failure with cephalexin with sensitivity report with intermediate sensitivity to culture, will change to macrobid.  Reviewed GFR, 46. Will give short 5 day course with precautions given to pt.  All other options limited due to resistance.

## 2023-01-30 ENCOUNTER — Telehealth: Payer: Self-pay | Admitting: Family

## 2023-01-30 NOTE — Telephone Encounter (Signed)
Patients husband called in stating that  patients b/p is running low today.He said that it is reading 81/61. He said that she is not experiencing any other issues.I offered to send over to access nurse,however he declined.Said that he just want to give information to pcp.

## 2023-01-30 NOTE — Telephone Encounter (Signed)
Spoke with Tabitha over the phone to relay this message. Per Wyatt Mage - this is a normal thing for the pt as we have seen her in the past 2 weeks and both times her BP has been extremely low. Advise pt's husband to continue to monitor the pt's BP. If her BP gets any lower, if she experiences severe lightheadedness, dizziness or syncope, they need to seek emergency care immediately.  I have spoken with the pt's husband and he states that he just wanted [place] updated on the pt's BP as we saw her last week and her BP was also low then. Pt is NOT having any symptoms whatsoever, such as lightheadedness, dizziness or syncope. I have advised him of Tabitha's recommendations and he verbalized understanding. Nothing further was needed at this time.

## 2023-02-02 NOTE — Telephone Encounter (Signed)
Noted  

## 2023-02-03 ENCOUNTER — Encounter: Payer: Self-pay | Admitting: Cardiovascular Disease

## 2023-02-03 NOTE — Progress Notes (Unsigned)
  Cardiology Office Note:  .   Date:  02/04/2023  ID:  Frances Hoffman, DOB [birth year], MRN [id number] PCP: [name], FNP  [facility] Providers Cardiologist:  Frances Miss, MD    History of Present Illness: .   Frances Hoffman is a 82 y.o. female with orthostatic hypotension  We are asked to see her for further evaluation of her orthostatic hypotension  Seen with Frances Hoffman .  Here to discuss her orthostatic symptoms Has been present for several months , seems to be getting worse Trys to drink water .   Was started on Florinef. Has not helped ( 0.05 mg a day )   No Cp , no dyspnea   Exercises 4 times a week ( exercise bike    Echo in 2019 showed normal LV function,  grade I DD  Trivial MR, trivial TR , trivial PI  Repeat echo has been ordered   Increase your intake of fluids (V8 juice,  water with electrolyte supplements like Liquid IV,   Nuun tablets, ) , protein ( hard boiled eggs, chicken, fish) ,      ROS:   Studies Reviewed: .         Risk Assessment/Calculations:      Physical Exam:   VS:  Pulse 83   Ht 5\' 4"  (1.626 m)   Wt 177 lb 6.4 oz (80.5 kg)   SpO2 94%   BMI 30.45 kg/m    Wt Readings from Last 3 Encounters:  02/04/23 177 lb 6.4 oz (80.5 kg)  01/29/23 172 lb (78 kg)  01/28/23 177 lb (80.3 kg)    GEN: Well nourished, well developed in no acute distress NECK: No JVD; No carotid bruits CARDIAC: RRR, no murmurs, rubs, gallops RESPIRATORY:  Clear to auscultation without rales, wheezing or rhonchi  ABDOMEN: Soft, non-tender, non-distended EXTREMITIES:  No edema; No deformity   ASSESSMENT AND PLAN: .   Orthostatic hypotension: Kriste Hoffman presents for further evaluation of her orthostatic hypotension.  She has been started on Florinef 0.05 mg a day.  Will increase the Florinef to 0.1 mg a day.  We can go as high as Florinef 0.2 mg a day if needed.  I encouraged her to add more salt to her diet.  I recommended using Morton's lite salt which is  half sodium and have potassium.  I encouraged her to eat more protein with each meal.  From what I can see she is not on any medications that would cause her blood pressure to be low.  I will see her in 3 to 4 months for a follow-up visit.        Dispo:   Signed, Frances Miss, MD

## 2023-02-04 ENCOUNTER — Encounter: Payer: Self-pay | Admitting: Cardiovascular Disease

## 2023-02-04 ENCOUNTER — Ambulatory Visit: Payer: Medicare HMO | Attending: Cardiovascular Disease | Admitting: Cardiovascular Disease

## 2023-02-04 VITALS — HR 83 | Ht 64.0 in | Wt 177.4 lb

## 2023-02-04 DIAGNOSIS — I951 Orthostatic hypotension: Secondary | ICD-10-CM

## 2023-02-04 MED ORDER — FLUDROCORTISONE ACETATE 0.1 MG PO TABS: 0.1000 mg | ORAL_TABLET | Freq: Every day | ORAL | 2 refills | Status: DC

## 2023-02-04 NOTE — Patient Instructions (Addendum)
Medication Instructions:  Your physician has recommended you make the following change in your medication:  1) INCREASE florinef to 0.1 mg daily  *If you need a refill on your cardiac medications before your next appointment, please call your pharmacy*  Lab Work: None ordered today.  Testing/Procedures: None ordered today.  Follow-Up: At [facility], you and your health needs are our priority.  As part of our continuing mission to provide you with exceptional heart care, we have created designated Provider Care Teams.  These Care Teams include your primary Cardiologist (physician) and Advanced Practice Providers (APPs -  Physician Assistants and Nurse Practitioners) who all work together to provide you with the care you need, when you need it.  Your next appointment:   3-4 month(s)  The format for your next appointment:   In Person  Provider:   [name], MD {

## 2023-02-11 IMAGING — MG MM DIGITAL SCREENING BILAT W/ TOMO AND CAD
8 series · 8 of 24 positions shown · non-contrast
Comparison: Previous exam(s).

ACR Breast Density Category a: The breast tissue is almost entirely
fatty.

CLINICAL DATA: Screening.

EXAM:
DIGITAL SCREENING BILATERAL MAMMOGRAM WITH TOMOSYNTHESIS AND CAD
TECHNIQUE: Bilateral screening digital craniocaudal and mediolateral oblique
mammograms were obtained. Bilateral screening digital breast
tomosynthesis was performed. The images were evaluated with
computer-aided detection.

[R CC synth-2D]
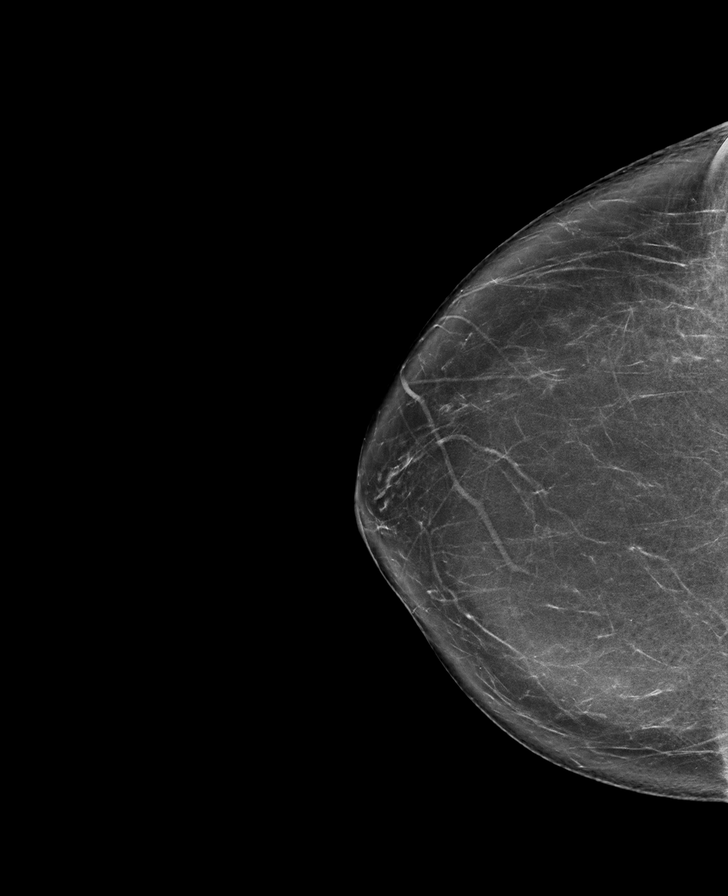

[L MLO synth-2D]
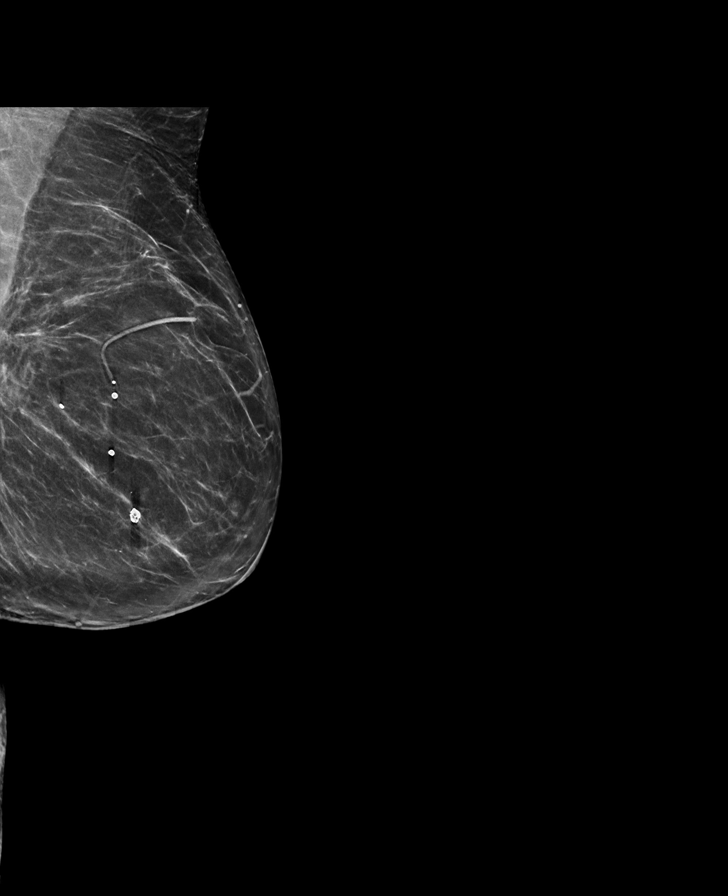

[L CC synth-2D]
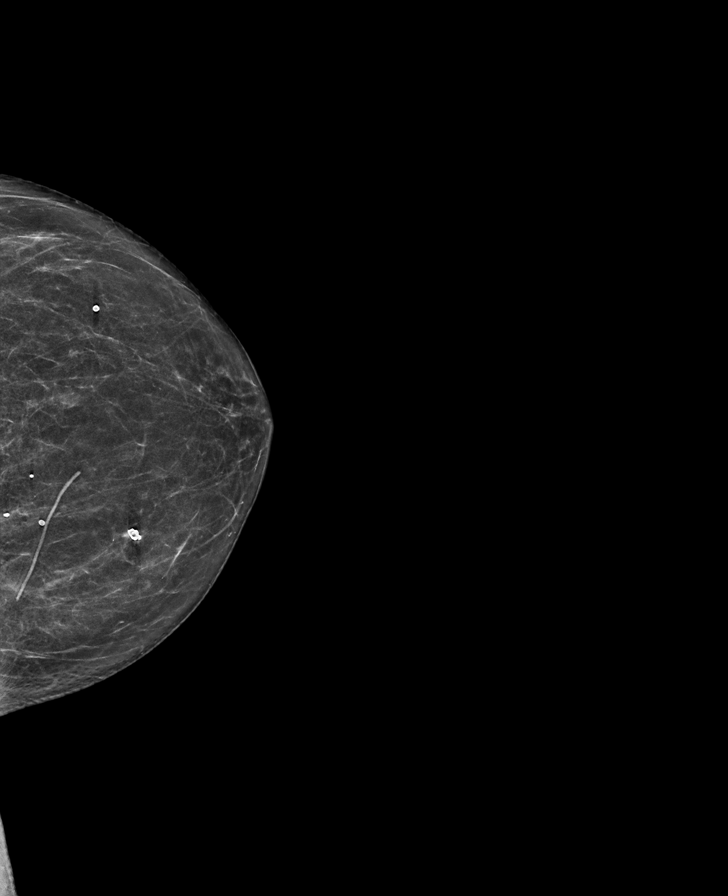

[R MLO synth-2D]
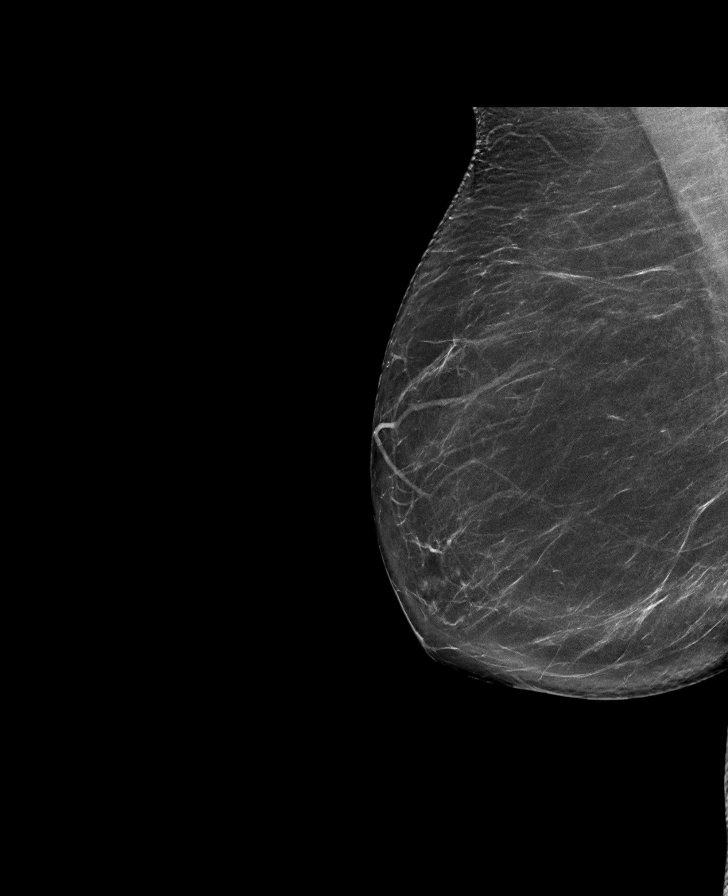

[L CC tomo · tomo slice 30/59.0]
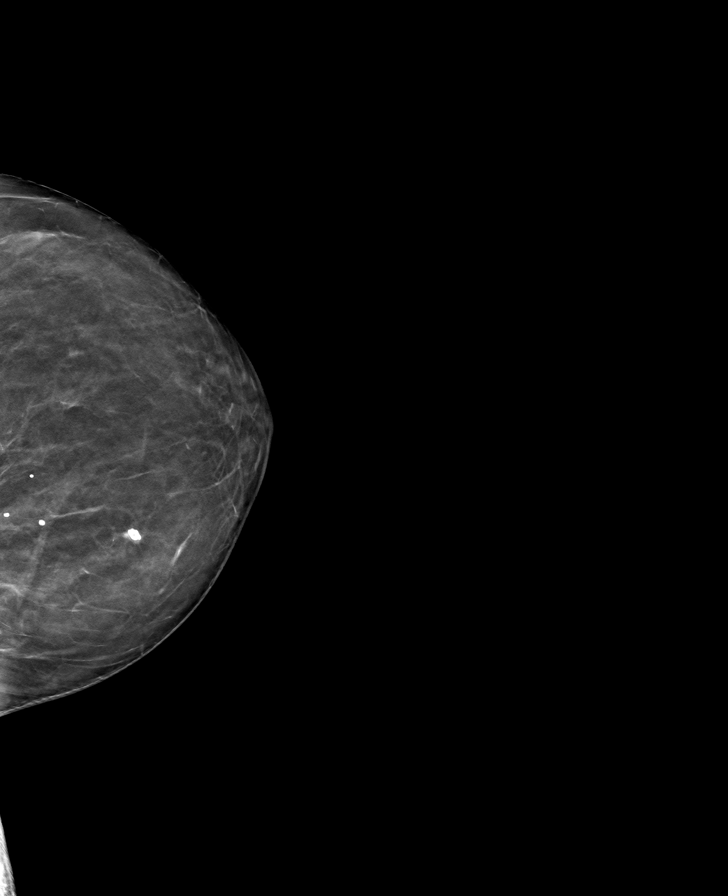

[R CC tomo · tomo slice 40/79.0]
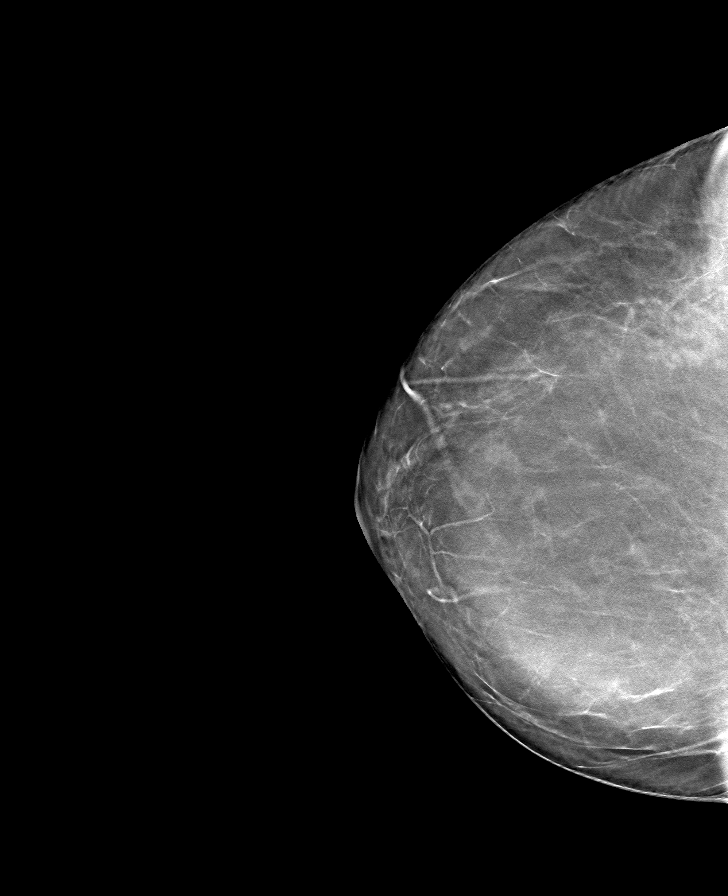

[R MLO tomo · tomo slice 41/81.0]
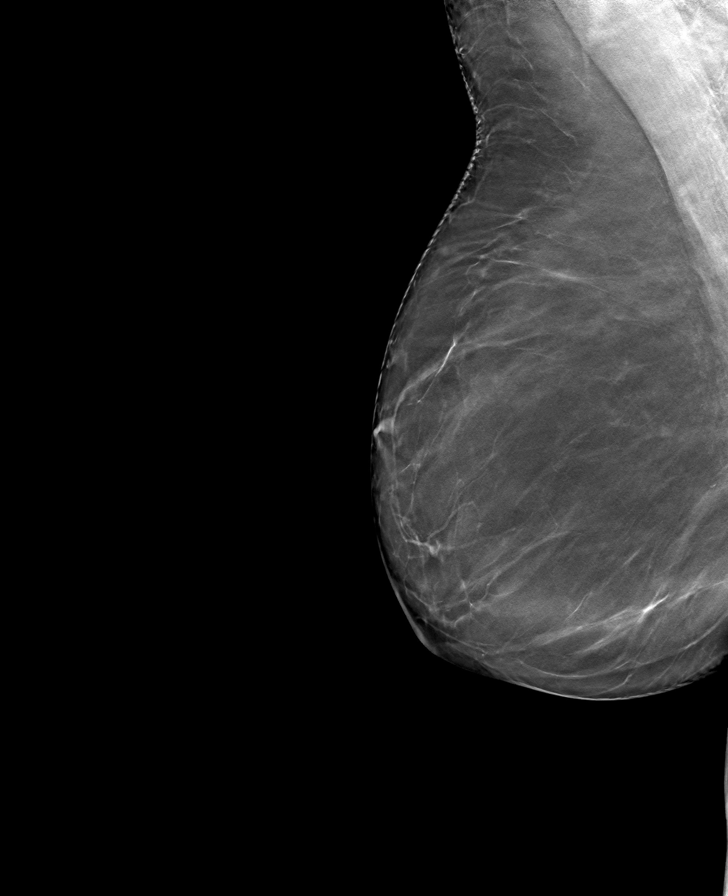

[L MLO tomo · tomo slice 37/72.0]
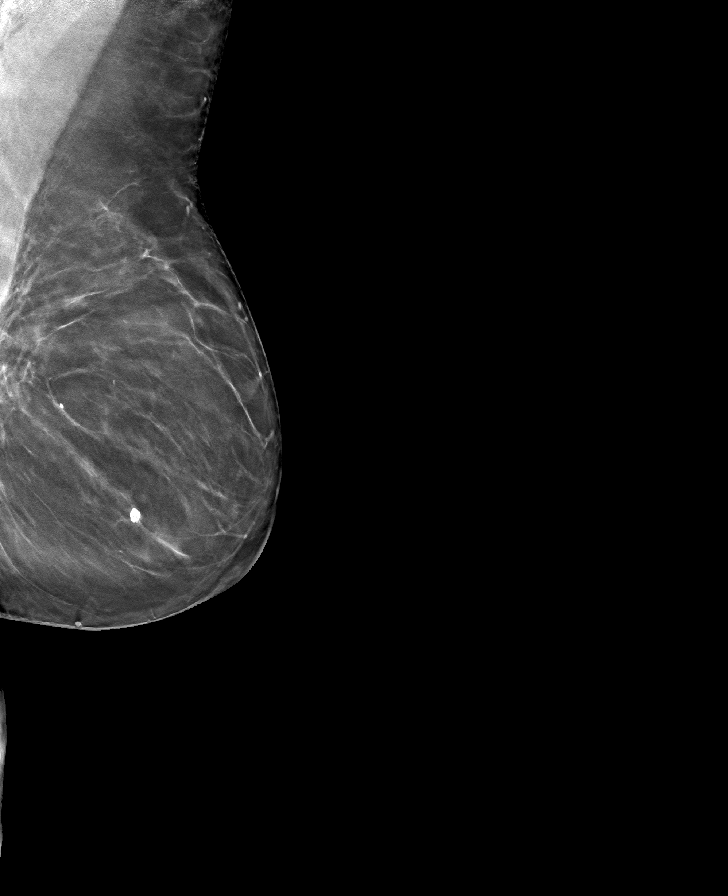

[8 of 24 positions shown; findings below may reference images not displayed]

FINDINGS: There are no findings suspicious for malignancy.
IMPRESSION: No mammographic evidence of malignancy. A result letter of this
screening mammogram will be mailed directly to the patient.

RECOMMENDATION:
Screening mammogram in one year. (Code:0E-[id number])

BI-RADS CATEGORY  1: Negative.

## 2023-02-13 ENCOUNTER — Telehealth: Payer: Self-pay | Admitting: Neurology

## 2023-02-13 ENCOUNTER — Ambulatory Visit (HOSPITAL_BASED_OUTPATIENT_CLINIC_OR_DEPARTMENT_OTHER): Payer: Medicare HMO

## 2023-02-13 ENCOUNTER — Telehealth: Payer: Self-pay

## 2023-02-13 DIAGNOSIS — I951 Orthostatic hypotension: Secondary | ICD-10-CM

## 2023-02-13 DIAGNOSIS — R42 Dizziness and giddiness: Secondary | ICD-10-CM

## 2023-02-13 DIAGNOSIS — F0150 Vascular dementia without behavioral disturbance: Secondary | ICD-10-CM

## 2023-02-13 LAB — ECHOCARDIOGRAM COMPLETE
Area-P 1/2: 4.06 cm2
S' Lateral: 2.36 cm

## 2023-02-13 NOTE — Telephone Encounter (Signed)
Pt has returned call to [facility]

## 2023-02-13 NOTE — Telephone Encounter (Signed)
-----   Message from Glean Salvo sent at 02/13/2023 11:43 AM EST ----- Please let her know carotid Doppler was overall unremarkable.  No significant stenosis was found. Thanks  Summary: Right Carotid: Velocities in the right ICA are consistent with a 1-39% stenosis.   Left Carotid: Velocities in the left ICA are consistent with a 1-39% stenosis.   Vertebrals:  Bilateral vertebral arteries demonstrate antegrade flow. Subclavians: Normal flow hemodynamics were seen in bilateral subclavian              arteries.

## 2023-02-13 NOTE — Telephone Encounter (Signed)
Patient called.  Patient aware.  

## 2023-02-13 NOTE — Telephone Encounter (Signed)
 Lmtrc 1st attempt

## 2023-02-14 ENCOUNTER — Encounter: Payer: Self-pay | Admitting: Neurology

## 2023-02-22 ENCOUNTER — Ambulatory Visit
Admission: RE | Admit: 2023-02-22 | Discharge: 2023-02-22 | Disposition: A | Discharge status: Home / Self Care | Payer: Medicare HMO | Source: Ambulatory Visit | Attending: Neurology | Admitting: Neurology

## 2023-02-22 DIAGNOSIS — I951 Orthostatic hypotension: Secondary | ICD-10-CM | POA: Diagnosis not present

## 2023-02-22 DIAGNOSIS — R42 Dizziness and giddiness: Secondary | ICD-10-CM

## 2023-02-22 DIAGNOSIS — F0150 Vascular dementia without behavioral disturbance: Secondary | ICD-10-CM

## 2023-02-25 ENCOUNTER — Encounter: Payer: Medicare HMO | Admitting: Family

## 2023-03-04 ENCOUNTER — Encounter (INDEPENDENT_AMBULATORY_CARE_PROVIDER_SITE_OTHER): Payer: Self-pay | Admitting: Family Medicine

## 2023-03-04 ENCOUNTER — Ambulatory Visit (INDEPENDENT_AMBULATORY_CARE_PROVIDER_SITE_OTHER): Payer: Medicare HMO | Admitting: Family Medicine

## 2023-03-04 VITALS — BP 89/52 | HR 91 | Temp 97.3°F | Ht 64.0 in | Wt 175.0 lb

## 2023-03-04 DIAGNOSIS — E669 Obesity, unspecified: Secondary | ICD-10-CM | POA: Diagnosis not present

## 2023-03-04 DIAGNOSIS — Z6830 Body mass index (BMI) 30.0-30.9, adult: Secondary | ICD-10-CM

## 2023-03-04 DIAGNOSIS — R7303 Prediabetes: Secondary | ICD-10-CM

## 2023-03-04 MED ORDER — METFORMIN HCL 500 MG PO TABS: 500.0000 mg | ORAL_TABLET | Freq: Two times a day (BID) | ORAL | 0 refills | Status: DC

## 2023-03-04 NOTE — Progress Notes (Signed)
.smr  Office: [phone]  /  Fax: [phone]  WEIGHT SUMMARY AND BIOMETRICS  Anthropometric Measurements Height: 5\' 4"  (1.626 m) Weight: 175 lb (79.4 kg) BMI (Calculated): 30.02 Weight at Last Visit: 172 lb Weight Lost Since Last Visit: 0 Weight Gained Since Last Visit: 3 lb Starting Weight: 180 lb Total Weight Loss (lbs): 5 lb (2.268 kg)   Body Composition  Body Fat %: 44.3 % Fat Mass (lbs): 77.8 lbs Muscle Mass (lbs): 93 lbs Total Body Water (lbs): 67 lbs Visceral Fat Rating : 14   Other Clinical Data Fasting: No Labs: No Today's Visit #: 7 Starting Date: 10/30/22    Chief Complaint: OBESITY   History of Present Illness   The patient, with a history of prediabetes and obesity, presents for a routine follow-up and medication refill. She has been on metformin for prediabetes and has been working on diet, exercise, and weight loss to manage her conditions. However, she reports a weight gain of three pounds over the holiday season. She has not been journaling her food intake and has been using an exercise bike for about ten minutes three times per week.  The patient acknowledges the need for a structured eating plan and is open to a journaling plan. She expresses interest in understanding her caloric and protein intake and is willing to track her food intake. She is also open to increasing her metformin dosage to two pills a day, as she has not experienced any nausea with the current dosage.  The patient's exercise regimen consists of using an exercise bike for ten minutes three times a week. She is open to increasing the frequency of her exercise sessions rather than the duration.  The patient's overall goal is to manage her prediabetes and obesity through diet, exercise, and medication. She understands that this is a lifelong commitment and is willing to make the necessary changes to her lifestyle.          PHYSICAL EXAM:  Blood pressure (!) 89/52, pulse 91,  temperature (!) 97.3 F (36.3 C), height 5\' 4"  (1.626 m), weight 175 lb (79.4 kg), SpO2 93%. Body mass index is 30.04 kg/m.  DIAGNOSTIC DATA REVIEWED:  BMET    Component Value Date/Time   NA 141 01/17/2023 1151   NA 144 12/20/2022 1122   K 4.8 01/17/2023 1151   CL 101 01/17/2023 1151   CO2 32 01/17/2023 1151   GLUCOSE 100 (H) 01/17/2023 1151   BUN 13 01/17/2023 1151   BUN 12 12/20/2022 1122   CREATININE 1.11 01/17/2023 1151   CREATININE 0.98 (H) 02/10/2016 1035   CALCIUM 9.9 01/17/2023 1151   GFRNONAA 47 (L) 01/12/2023 1438   GFRNONAA 59 (L) 12/07/2015 0832   GFRAA 67 12/31/2017 1027   GFRAA 68 12/07/2015 0832   Lab Results  Component Value Date   HGBA1C 5.8 (H) 12/20/2022   HGBA1C 5.6 04/03/2014   Lab Results  Component Value Date   INSULIN 18.7 12/20/2022   INSULIN 12.8 11/28/2022   Lab Results  Component Value Date   TSH 0.61 01/17/2023   CBC    Component Value Date/Time   WBC 7.3 01/17/2023 1151   RBC 4.67 01/17/2023 1151   HGB 14.5 01/17/2023 1151   HGB 15.2 11/28/2022 1315   HCT 44.2 01/17/2023 1151   HCT 46.0 11/28/2022 1315   PLT 308.0 01/17/2023 1151   PLT 296 11/28/2022 1315   MCV 94.8 01/17/2023 1151   MCV 95 11/28/2022 1315   MCH 30.9 01/12/2023 1805  MCHC 32.8 01/17/2023 1151   RDW 14.2 01/17/2023 1151   RDW 12.9 11/28/2022 1315   Iron Studies    Component Value Date/Time   IRON 74 07/04/2016 0000   TIBC 319 07/04/2016 0000   FERRITIN 111.1 08/23/2020 1513   IRONPCTSAT 23 07/04/2016 0000   Lipid Panel     Component Value Date/Time   CHOL 170 11/28/2022 1315   TRIG 165 (H) 11/28/2022 1315   HDL 38 (L) 11/28/2022 1315   CHOLHDL 4 02/15/2022 0913   VLDL 38.2 02/15/2022 0913   LDLCALC 103 (H) 11/28/2022 1315   LDLDIRECT 92.0 04/20/2014 0930   Hepatic Function Panel     Component Value Date/Time   PROT 7.3 01/12/2023 1438   PROT 6.7 12/20/2022 1122   ALBUMIN 3.9 01/12/2023 1438   ALBUMIN 4.1 12/20/2022 1122   AST 25  01/12/2023 1438   ALT 22 01/12/2023 1438   ALKPHOS 99 01/12/2023 1438   BILITOT 0.7 01/12/2023 1438   BILITOT 0.7 12/20/2022 1122   BILIDIR 0.2 04/18/2021 0746   IBILI 0.8 04/18/2021 0746      Component Value Date/Time   TSH 0.61 01/17/2023 1151   Nutritional Lab Results  Component Value Date   VD25OH 67.4 11/28/2022   VD25OH 68.44 02/15/2022   VD25OH 56.7 11/26/2017     Assessment and Plan    Prediabetes Prediabetes managed with metformin. No reported side effects. Discussed the importance of maintaining blood glucose levels to prevent progression to diabetes. Emphasized combining medication with dietary management for optimal outcomes. - Refill metformin prescription - Increase metformin to one pill in the morning and half a pill at lunch or dinner - Educate on the importance of diet and exercise in managing prediabetes  Obesity Obesity with recent weight gain of three pounds over the holidays. Discussed the benefits of a structured eating plan and journaling to manage weight. Explained genetic influence on obesity and provided dietary and exercise guidelines. - Implement a journaling plan to track calories and protein intake - Set calorie range between 1000-1200 calories per day - Set protein goal of at least 75 grams per day - Provide handout with protein-rich food options - Encourage use of exercise bike for 10 minutes, three times a week, and increase frequency to four times a week when ready  General Health Maintenance Discussed the importance of maintaining a balanced diet and regular exercise. Provided guidance on using apps and online resources to track nutrition. - Educate on using apps and online resources to track nutrition - Provide handout with protein-rich food options  Follow-up - Schedule follow-up appointment for end of January or beginning of February - Use MyChart app for any interim needs.        She was informed of the importance of frequent  follow up visits to maximize her success with intensive lifestyle modifications for her multiple health conditions.    Quillian Quince, MD

## 2023-03-16 ENCOUNTER — Other Ambulatory Visit: Payer: Self-pay | Admitting: Family Medicine

## 2023-03-16 DIAGNOSIS — E7849 Other hyperlipidemia: Secondary | ICD-10-CM

## 2023-03-22 DIAGNOSIS — Z961 Presence of intraocular lens: Secondary | ICD-10-CM | POA: Diagnosis not present

## 2023-03-22 DIAGNOSIS — H0102A Squamous blepharitis right eye, upper and lower eyelids: Secondary | ICD-10-CM | POA: Diagnosis not present

## 2023-03-22 DIAGNOSIS — H0102B Squamous blepharitis left eye, upper and lower eyelids: Secondary | ICD-10-CM | POA: Diagnosis not present

## 2023-03-24 ENCOUNTER — Other Ambulatory Visit (INDEPENDENT_AMBULATORY_CARE_PROVIDER_SITE_OTHER): Payer: Self-pay | Admitting: Physician Assistant

## 2023-03-24 DIAGNOSIS — R7303 Prediabetes: Secondary | ICD-10-CM

## 2023-03-25 ENCOUNTER — Telehealth: Payer: Self-pay | Admitting: Neurology

## 2023-03-25 NOTE — Telephone Encounter (Signed)
Called to confirm appointment details

## 2023-03-26 ENCOUNTER — Encounter: Payer: Self-pay | Admitting: Family

## 2023-03-26 ENCOUNTER — Other Ambulatory Visit: Payer: Self-pay | Admitting: Family

## 2023-03-26 ENCOUNTER — Ambulatory Visit (INDEPENDENT_AMBULATORY_CARE_PROVIDER_SITE_OTHER): Payer: Medicare HMO | Admitting: Family

## 2023-03-26 VITALS — BP 100/70 | HR 66 | Temp 98.3°F | Ht 64.0 in | Wt 178.0 lb

## 2023-03-26 DIAGNOSIS — G4719 Other hypersomnia: Secondary | ICD-10-CM | POA: Insufficient documentation

## 2023-03-26 DIAGNOSIS — R7303 Prediabetes: Secondary | ICD-10-CM | POA: Diagnosis not present

## 2023-03-26 DIAGNOSIS — F331 Major depressive disorder, recurrent, moderate: Secondary | ICD-10-CM

## 2023-03-26 DIAGNOSIS — E538 Deficiency of other specified B group vitamins: Secondary | ICD-10-CM | POA: Diagnosis not present

## 2023-03-26 DIAGNOSIS — E039 Hypothyroidism, unspecified: Secondary | ICD-10-CM | POA: Diagnosis not present

## 2023-03-26 DIAGNOSIS — Z853 Personal history of malignant neoplasm of breast: Secondary | ICD-10-CM | POA: Diagnosis not present

## 2023-03-26 DIAGNOSIS — Z1231 Encounter for screening mammogram for malignant neoplasm of breast: Secondary | ICD-10-CM

## 2023-03-26 DIAGNOSIS — E782 Mixed hyperlipidemia: Secondary | ICD-10-CM | POA: Diagnosis not present

## 2023-03-26 DIAGNOSIS — R944 Abnormal results of kidney function studies: Secondary | ICD-10-CM

## 2023-03-26 DIAGNOSIS — M85851 Other specified disorders of bone density and structure, right thigh: Secondary | ICD-10-CM | POA: Diagnosis not present

## 2023-03-26 DIAGNOSIS — E7849 Other hyperlipidemia: Secondary | ICD-10-CM

## 2023-03-26 DIAGNOSIS — F5104 Psychophysiologic insomnia: Secondary | ICD-10-CM

## 2023-03-26 DIAGNOSIS — Z0000 Encounter for general adult medical examination without abnormal findings: Secondary | ICD-10-CM | POA: Diagnosis not present

## 2023-03-26 LAB — COMPREHENSIVE METABOLIC PANEL
ALT: 14 U/L (ref 0–35)
AST: 19 U/L (ref 0–37)
Albumin: 4 g/dL (ref 3.5–5.2)
Alkaline Phosphatase: 91 U/L (ref 39–117)
BUN: 11 mg/dL (ref 6–23)
CO2: 27 meq/L (ref 19–32)
Calcium: 9.8 mg/dL (ref 8.4–10.5)
Chloride: 103 meq/L (ref 96–112)
Creatinine, Ser: 0.89 mg/dL (ref 0.40–1.20)
GFR: 60.34 mL/min (ref >60.00)
Glucose, Bld: 73 mg/dL (ref 70–99)
Potassium: 4.3 meq/L (ref 3.5–5.1)
Sodium: 141 meq/L (ref 135–145)
Total Bilirubin: 0.6 mg/dL (ref 0.2–1.2)
Total Protein: 6.6 g/dL (ref 6.0–8.3)

## 2023-03-26 LAB — LIPID PANEL
Cholesterol: 182 mg/dL (ref 0–200)
HDL: 44.2 mg/dL (ref >39.00)
LDL Cholesterol: 109 mg/dL — ABNORMAL HIGH (ref 0–99)
NonHDL: 137.8
Total CHOL/HDL Ratio: 4
Triglycerides: 142 mg/dL (ref 0.0–149.0)
VLDL: 28.4 mg/dL (ref 0.0–40.0)

## 2023-03-26 LAB — CBC
HCT: 43 % (ref 36.0–46.0)
Hemoglobin: 14.2 g/dL (ref 12.0–15.0)
MCHC: 33.1 g/dL (ref 30.0–36.0)
MCV: 93.6 fL (ref 78.0–100.0)
Platelets: 291 10*3/uL (ref 150.0–400.0)
RBC: 4.6 Mil/uL (ref 3.87–5.11)
RDW: 14.7 % (ref 11.5–15.5)
WBC: 7.5 10*3/uL (ref 4.0–10.5)

## 2023-03-26 LAB — VITAMIN B12: Vitamin B-12: >1537 pg/mL — ABNORMAL HIGH (ref 211–911)

## 2023-03-26 LAB — HEMOGLOBIN A1C: Hgb A1c MFr Bld: 5.9 % (ref 4.6–6.5)

## 2023-03-26 LAB — TSH: TSH: 0.26 u[IU]/mL — ABNORMAL LOW (ref 0.35–5.50)

## 2023-03-26 MED ORDER — TRAZODONE HCL 50 MG PO TABS: ORAL_TABLET | ORAL | 3 refills | Status: DC

## 2023-03-26 MED ORDER — ATORVASTATIN CALCIUM 80 MG PO TABS: 80.0000 mg | ORAL_TABLET | Freq: Every day | ORAL | 3 refills | Status: AC

## 2023-03-26 NOTE — Patient Instructions (Addendum)
  I have sent an electronic order over to your preferred location for the following:   []   2D Mammogram  [x]   3D Mammogram  [x]   Bone Density   Please give this center a call to get scheduled at your convenience.   [x]   [facility]      [address] [place], [place]        [phone]         Make sure to wear two piece  clothing  No lotions powders or deodorants the day of the appointment Make sure to bring picture ID and insurance card.  Bring list of medications you are currently taking including any supplements.    ------------------------------------

## 2023-03-26 NOTE — Assessment & Plan Note (Signed)
Ordered lipid panel, pending results. Work on low cholesterol diet and exercise as tolerated  

## 2023-03-26 NOTE — Assessment & Plan Note (Signed)
Stable on lexapro 20mg once daily.    

## 2023-03-26 NOTE — Assessment & Plan Note (Signed)
Screening mammogram ordered

## 2023-03-26 NOTE — Assessment & Plan Note (Signed)

## 2023-03-26 NOTE — Assessment & Plan Note (Signed)
Tsh today pending results.

## 2023-03-26 NOTE — Assessment & Plan Note (Signed)
Ordered b12 pending results  

## 2023-03-26 NOTE — Assessment & Plan Note (Signed)
Cmp ordered pending results.

## 2023-03-26 NOTE — Progress Notes (Signed)
Subjective:  Patient ID: Frances Hoffman, female    DOB: [birth year]  Age: 83 y.o. MRN: [id number]  Patient Care Team: [name], FNP as PCP - General (Family Medicine) Nahser, Deloris Ping, MD as PCP - Cardiology (Cardiology) [facility]   CC:  Chief Complaint  Patient presents with   Annual Exam    HPI Frances Hoffman is a 83 y.o. female who presents today for an annual physical exam. She reports consuming a general diet. Home exercise routine includes exercise bike a few times a week. She generally feels fairly well. She reports sleeping poorly. She does have additional problems to discuss today.   Increased fatigue, feels tired often throughout the day.   Vision:Within last year Dental:Receives regular dental care  Mammogram: 08/17/22 Last pap: no longer needed > 43 y/o  Colonoscopy:04/01/2018 no longer needed due to age  Bone density scan:2022 due   Pt is with acute concerns.   Feels 'prone to diarrhea'. Used to have diarrhea about every three months but not as bad any more. Doesn't notice any trigger foods. She states has been improving however, no real complaints. She is ok with this right now as stable just an [place]  Advanced Directives Patient does have advanced directives. She does not have a copy in the electronic medical record.   DEPRESSION SCREENING    01/28/2023   12:00 PM 01/24/2023   10:41 AM 01/17/2023   11:21 AM 08/22/2022   10:26 AM 08/17/2022    2:09 PM 01/24/2022    9:10 AM 06/14/2021    3:20 PM  PHQ 2/9 Scores  PHQ - 2 Score 2 3 4 2 2 1  0  PHQ- 9 Score 3 6 12 7 7 1       ROS: Negative unless specifically indicated above in HPI.    Current Outpatient Medications:    acetaminophen (TYLENOL) 500 MG tablet, Take 500 mg by mouth every 6 (six) hours as needed., Disp: , Rfl:    aspirin-sod bicarb-citric acid (ALKA-SELTZER) 325 MG TBEF tablet, Take 325 mg by mouth every 6 (six) hours as needed., Disp: , Rfl:    atorvastatin (LIPITOR)  40 MG tablet, Take 1 tablet (40 mg total) by mouth daily. for cholesterol. MUST KEEP OV FOR FURTHER REFILLS, Disp: 30 tablet, Rfl: 0   calcium carbonate (OS-CAL - DOSED IN MG OF ELEMENTAL CALCIUM) 1250 (500 CA) MG tablet, Take 1 tablet by mouth 2 (two) times daily with a meal., Disp: , Rfl:    carboxymethylcellulose (REFRESH PLUS) 0.5 % SOLN, Place 1 drop into both eyes 3 (three) times daily as needed (for dryness)., Disp: , Rfl:    Cholecalciferol (VITAMIN D3) 2000 UNITS capsule, Take 2,000 Units by mouth daily., Disp: , Rfl:    Cinnamon 500 MG TABS, Take 500 mg by mouth daily., Disp: , Rfl:    cyanocobalamin ([employer] VITAMIN B12) 2000 MCG tablet, Take 1 tablet (2,000 mcg total) by mouth daily., Disp: , Rfl:    escitalopram (LEXAPRO) 20 MG tablet, Take 1 tablet (20 mg total) by mouth daily., Disp: 90 tablet, Rfl: 4   fludrocortisone (FLORINEF) 0.1 MG tablet, Take 1 tablet (0.1 mg total) by mouth daily., Disp: 30 tablet, Rfl: 2   levothyroxine (SYNTHROID) 88 MCG tablet, Take 1 tablet (88 mcg total) by mouth daily., Disp: 90 tablet, Rfl: 3   meclizine (ANTIVERT) 50 MG tablet, Take 1 tablet (50 mg total) by mouth 3 (three) times daily as needed., Disp: 30 tablet, Rfl:  0   memantine (NAMENDA) 10 MG tablet, Take 1 tablet (10 mg total) by mouth 2 (two) times daily., Disp: 180 tablet, Rfl: 3   metFORMIN (GLUCOPHAGE) 500 MG tablet, Take 1 tablet (500 mg total) by mouth 2 (two) times daily with a meal., Disp: 60 tablet, Rfl: 0   omeprazole (PRILOSEC) 20 MG capsule, Take 20 mg by mouth daily., Disp: , Rfl:    traZODone (DESYREL) 50 MG tablet, Take 1/2 tablet po at bedtime, Disp: 30 tablet, Rfl: 3    Objective:    BP 100/70 (BP Location: Left Arm, Patient Position: Sitting, Cuff Size: Normal)   Pulse 66   Temp 98.3 F (36.8 C) (Temporal)   Ht 5\' 4"  (1.626 m)   Wt 178 lb (80.7 kg)   SpO2 93%   BMI 30.55 kg/m   BP Readings from Last 3 Encounters:  03/26/23 100/70  03/04/23 (!) 89/52  01/29/23 90/60       Physical Exam Constitutional:      General: She is not in acute distress.    Appearance: Normal appearance. She is normal weight. She is not ill-appearing.  HENT:     Head: Normocephalic.     Right Ear: Tympanic membrane normal.     Left Ear: Tympanic membrane normal.     Nose: Nose normal.     Mouth/Throat:     Mouth: Mucous membranes are moist.  Eyes:     Extraocular Movements: Extraocular movements intact.     Pupils: Pupils are equal, round, and reactive to light.  Cardiovascular:     Rate and Rhythm: Normal rate and regular rhythm.  Pulmonary:     Effort: Pulmonary effort is normal.     Breath sounds: Normal breath sounds.  Abdominal:     General: Abdomen is flat. Bowel sounds are normal.     Palpations: Abdomen is soft.     Tenderness: There is no guarding or rebound.  Musculoskeletal:        General: Normal range of motion.     Cervical back: Normal range of motion.  Skin:    General: Skin is warm.     Capillary Refill: Capillary refill takes less than 2 seconds.  Neurological:     General: No focal deficit present.     Mental Status: She is alert.  Psychiatric:        Mood and Affect: Mood normal.        Behavior: Behavior normal.        Thought Content: Thought content normal.        Judgment: Judgment normal.          Assessment & Plan:  Screening mammogram for breast cancer -     3D Screening Mammogram, Left and Right; Future  History of breast cancer Assessment & Plan: Screening mammogram ordered   Orders: -     3D Screening Mammogram, Left and Right; Future  Osteopenia of neck of right femur -     DG Bone Density; Future  Excessive daytime sleepiness Assessment & Plan: B12 and cbc and tsh ordered pending results  If negative workup consider sleep study   Orders: -     CBC  Acquired hypothyroidism Assessment & Plan: Tsh today pending results   Orders: -     TSH  B12 deficiency Assessment & Plan: Ordered b12 pending  results   Orders: -     Vitamin B12  Mixed hyperlipidemia Assessment & Plan: Ordered lipid panel, pending results. Work on  low cholesterol diet and exercise as tolerated   Orders: -     Lipid panel  Prediabetes -     Hemoglobin A1c  Decreased GFR Assessment & Plan: Cmp ordered pending results   Orders: -     Comprehensive metabolic panel  Encounter for general adult medical examination without abnormal findings Assessment & Plan: Patient Counseling(The following topics were reviewed):  Preventative care handout given to pt  Health maintenance and immunizations reviewed. Please refer to Health maintenance section. Pt advised on safe sex, wearing seatbelts in car, and proper nutrition labwork ordered today for annual Dental health: Discussed importance of regular tooth brushing, flossing, and dental visits.    Chronic insomnia Assessment & Plan: Tylenol pm as needed however pt requesting RX medications  Orders: -     traZODone HCl; Take 1/2 tablet po at bedtime  Dispense: 30 tablet; Refill: 3  MDD (major depressive disorder), recurrent episode, moderate (HCC) Assessment & Plan: Stable on lexapro 20 mg once daily        Follow-up: No follow-ups on file.   [name], FNP

## 2023-03-26 NOTE — Assessment & Plan Note (Signed)
Tylenol pm as needed however pt requesting RX medications

## 2023-03-26 NOTE — Assessment & Plan Note (Signed)
B12 and cbc and tsh ordered pending results  If negative workup consider sleep study

## 2023-04-08 ENCOUNTER — Other Ambulatory Visit (INDEPENDENT_AMBULATORY_CARE_PROVIDER_SITE_OTHER): Payer: Self-pay | Admitting: Family Medicine

## 2023-04-08 DIAGNOSIS — R7303 Prediabetes: Secondary | ICD-10-CM

## 2023-04-09 ENCOUNTER — Encounter (INDEPENDENT_AMBULATORY_CARE_PROVIDER_SITE_OTHER): Payer: Self-pay | Admitting: Family Medicine

## 2023-04-09 ENCOUNTER — Ambulatory Visit (INDEPENDENT_AMBULATORY_CARE_PROVIDER_SITE_OTHER): Payer: Medicare HMO | Admitting: Family Medicine

## 2023-04-09 ENCOUNTER — Telehealth (INDEPENDENT_AMBULATORY_CARE_PROVIDER_SITE_OTHER): Payer: Self-pay | Admitting: Family Medicine

## 2023-04-09 VITALS — BP 97/64 | HR 82 | Temp 98.3°F | Ht 64.0 in | Wt 173.0 lb

## 2023-04-09 DIAGNOSIS — Z6829 Body mass index (BMI) 29.0-29.9, adult: Secondary | ICD-10-CM | POA: Diagnosis not present

## 2023-04-09 DIAGNOSIS — F0150 Vascular dementia without behavioral disturbance: Secondary | ICD-10-CM | POA: Diagnosis not present

## 2023-04-09 DIAGNOSIS — I951 Orthostatic hypotension: Secondary | ICD-10-CM | POA: Diagnosis not present

## 2023-04-09 DIAGNOSIS — E669 Obesity, unspecified: Secondary | ICD-10-CM | POA: Diagnosis not present

## 2023-04-09 DIAGNOSIS — R7303 Prediabetes: Secondary | ICD-10-CM

## 2023-04-09 MED ORDER — METFORMIN HCL 500 MG PO TABS: 500.0000 mg | ORAL_TABLET | Freq: Two times a day (BID) | ORAL | 0 refills | Status: AC

## 2023-04-09 NOTE — Telephone Encounter (Signed)
Patient called in stating that she has taken two of the pills prescribed to her today (metformin) and now has very bad diarrhea. Patient would like a call back from Dr. Sharee Holster asap.

## 2023-04-09 NOTE — Progress Notes (Signed)
 Frances Hoffman, D.O.  ABFM, ABOM Specializing in Clinical Bariatric Medicine  Office located at: [address] [place], [place]  [address]   Assessment and Plan:   FOR THE DISEASE OF OBESITY: Obesity, Beginning BMI 32.40 BMI 29.0-29.9,adult - Current BMI 29.68 Assessment & Plan: Since last office visit on 03/04/23 patient's muscle mass had no change, at 93 lbs. Fat mass has decreased by 2.4lb. Total body water has decreased by 1lb.  Counseling done on how various foods will affect these numbers and how to maximize success  Total lbs lost to date: 7 lbs Total weight loss percentage to date: -3.89%    Recommended Dietary Goals Frances Hoffman is currently in the action stage of change. As such, her goal is to continue weight management plan.  She has agreed to: switch to Category 1 meal plan with B/L options and optional 100 snack calories.   Due to her vascular dementia and cognitive dysfunction, she is being switched to a meal plan that she can remember and follow better.   Behavioral Intervention We discussed the following today: increasing lean protein intake to established goals, decreasing simple carbohydrates , avoiding skipping meals, increasing water intake , identifying sources and decreasing liquid calories, avoiding temptations and identifying enticing environmental cues, and continue to work on implementation of reduced calorie nutritional plan  Additional resources provided today: handout on CAT 1 meal plan  and Handout on CAT 1-2 breakfast options  Evidence-based interventions for health behavior change were utilized today including the discussion of self monitoring techniques, problem-solving barriers and SMART goal setting techniques.   Regarding patient's less desirable eating habits and patterns, we employed the technique of small changes.   Pt will specifically work on: follow new meal plan and increase exercise as able for next visit.    Recommended Physical  Activity Goals Frances Hoffman has been advised to work up to 150 minutes of moderate intensity aerobic activity a week and strengthening exercises 2-3 times per week for cardiovascular health, weight loss maintenance and preservation of muscle mass. Encouraged pt to continue staying activity and exercise. Reviewed how exercising can help with brain health, moods, and memory.   She has agreed to :  Think about enjoyable ways to increase daily physical activity and overcoming barriers to exercise and Increase physical activity in their day and reduce sedentary time (increase NEAT).   Pharmacotherapy We discussed various medication options to help Frances Hoffman with her weight loss efforts and we both agreed to : continue with nutritional and behavioral strategies and adequate clinical response to current dose, continue current regimen   FOR ASSOCIATED CONDITIONS ADDRESSED TODAY: Prediabetes Assessment & Plan: Lab Results  Component Value Date   HGBA1C 5.9 03/26/2023   HGBA1C 5.8 (H) 12/20/2022   HGBA1C 5.9 (H) 11/28/2022   INSULIN  18.7 12/20/2022   INSULIN  12.8 11/28/2022    Most recent A1C was 5.9  weeks ago. Pt was started on Metformin  on 12/27/22 by Dr. Verdon. She has been compliant with Metformin  500 mg BID with no intolerances or SE. Approximately 3.5 months ago her serum creatinine was at 1.04. Most recently 2 weeks ago her serum creatine improved to 0.89. She also reports a chronic hx of diarrhea (for years and years) which is now well controlled. States this has not worsened recently and not worsened since starting Metformin . She previously was having episodes more frequently and taking Imodium to manage this, however, she has not needed to take Imodium for several months now.  Continue with Metformin  500 BID, as directed. Encouraged pt to avoid drinking her calories. Educated pt on how exercise can help improve moods, brain health, and memory.    Orthostatic hypotension Assessment &  Plan: BP Readings from Last 3 Encounters:  04/09/23 97/64  03/26/23 100/70  03/04/23 (!) 89/52   BP has improved today from last visit. Pt was seen recenlty on 12/2 by Dr. Alveta of cardiology. Her Florinef  was doubled and she was encouraged to eat more salt (Morton lite salt). He did not recommend any med changes as she was not on any meds that was likely to lower her BP. He also repeated ECHO and carotid US  at that time. ECHO completed on 02/13/23 was done for dizziness and lightheadedness symptoms, which was grossly unchanged from 2019. Results revealed a normal EF of 55-60%, no wall motion abnormalities, Mild LVH and the rest of the test was grossly WNL. Vascular US  of carotids on same day showed bilateral carotids were near normal with only minimal wall thickening or plaque. Pt was instructed to follow up in 3-4 months and has her next appointment scheduled on 3/4. Today, pt has no complaints of dizziness or lightheadedness and states this has improved since her last visit with Dr. Alveta. Only experiences some lightheadedness when getting up to use the bathroom in the middle of the night.   I highly recommend pt start using a walker or cane when going to the bathroom in the middle of the night. Additionally, take a couple of minutes to slowly transition from positions (laying down to sitting up and sitting up to standing, standing to walking).    Follow up:   Return in about 2 months (around 06/07/2023). She was informed of the importance of frequent follow up visits to maximize her success with intensive lifestyle modifications for her multiple health conditions.  Subjective:   Chief complaint: Obesity Frances Hoffman is here to discuss her progress with her obesity treatment plan. She is on the keeping a food journal and adhering to recommended goals of 1000-1200 calories and 75+ g of protein and states she is following her eating plan approximately 0% of the time. She states she is doing the  exercise bike 10 minutes 3-4 days per week.  Interval History:  Frances Hoffman is here for a follow up office visit. Since last OV, she is down 2 lbs. She states she has been doing what she wants in regard to following her meal plan. Reports cravings for ice creams and cookies. States she does not drink sufficient amounts of water daily. She enjoys drinking mini cans of sodas (90 calories).   Barriers identified: low volume of physical activity at present  and Hx of vascular dementia .  Pharmacotherapy for weight loss: She is currently taking Metformin  (off label use for incretin effect and / or insulin  resistance and / or diabetes prevention) with adequate clinical response  and without side effects..   Review of Systems:  Pertinent positives were addressed with patient today.  Reviewed by clinician on day of visit: allergies, medications, problem list, medical history, surgical history, family history, social history, and previous encounter notes.  Weight Summary and Biometrics   Weight Lost Since Last Visit: 2lb  Weight Gained Since Last Visit: 0    Vitals Temp: 98.3 F (36.8 C) BP: 97/64 Pulse Rate: 82 SpO2: 94 %   Anthropometric Measurements Height: 5' 4 (1.626 m) Weight: 173 lb (78.5 kg) BMI (Calculated): 29.68 Weight at Last Visit: 175lb Weight  Lost Since Last Visit: 2lb Weight Gained Since Last Visit: 0 Starting Weight: 180lb Total Weight Loss (lbs): 7 lb (3.175 kg)   Body Composition  Body Fat %: 43.5 % Fat Mass (lbs): 75.4 lbs Muscle Mass (lbs): 93 lbs Total Body Water (lbs): 66 lbs Visceral Fat Rating : 13   Other Clinical Data Fasting: no Labs: no Today's Visit #: 8 Starting Date: 10/30/22    Objective:   PHYSICAL EXAM: Blood pressure 97/64, pulse 82, temperature 98.3 F (36.8 C), height 5' 4 (1.626 m), weight 173 lb (78.5 kg), SpO2 94%. Body mass index is 29.7 kg/m.  General: she is overweight, cooperative and in no acute  distress. PSYCH: Has normal mood, affect and thought process.   HEENT: EOMI, sclerae are anicteric. Lungs: Normal breathing effort, no conversational dyspnea. Extremities: Moves * 4 Neurologic: A and O * 3, good insight  DIAGNOSTIC DATA REVIEWED: BMET    Component Value Date/Time   NA 141 03/26/2023 1055   NA 144 12/20/2022 1122   K 4.3 03/26/2023 1055   CL 103 03/26/2023 1055   CO2 27 03/26/2023 1055   GLUCOSE 73 03/26/2023 1055   BUN 11 03/26/2023 1055   BUN 12 12/20/2022 1122   CREATININE 0.89 03/26/2023 1055   CREATININE 0.98 (H) 02/10/2016 1035   CALCIUM  9.8 03/26/2023 1055   GFRNONAA 47 (L) 01/12/2023 1438   GFRNONAA 59 (L) 12/07/2015 0832   GFRAA 67 12/31/2017 1027   GFRAA 68 12/07/2015 0832   Lab Results  Component Value Date   HGBA1C 5.9 03/26/2023   HGBA1C 5.6 04/03/2014   Lab Results  Component Value Date   INSULIN  18.7 12/20/2022   INSULIN  12.8 11/28/2022   Lab Results  Component Value Date   TSH 0.26 (L) 03/26/2023   CBC    Component Value Date/Time   WBC 7.5 03/26/2023 1055   RBC 4.60 03/26/2023 1055   HGB 14.2 03/26/2023 1055   HGB 15.2 11/28/2022 1315   HCT 43.0 03/26/2023 1055   HCT 46.0 11/28/2022 1315   PLT 291.0 03/26/2023 1055   PLT 296 11/28/2022 1315   MCV 93.6 03/26/2023 1055   MCV 95 11/28/2022 1315   MCH 30.9 01/12/2023 1805   MCHC 33.1 03/26/2023 1055   RDW 14.7 03/26/2023 1055   RDW 12.9 11/28/2022 1315   Iron Studies    Component Value Date/Time   IRON 74 07/04/2016 0000   TIBC 319 07/04/2016 0000   FERRITIN 111.1 08/23/2020 1513   IRONPCTSAT 23 07/04/2016 0000   Lipid Panel     Component Value Date/Time   CHOL 182 03/26/2023 1055   CHOL 170 11/28/2022 1315   TRIG 142.0 03/26/2023 1055   HDL 44.20 03/26/2023 1055   HDL 38 (L) 11/28/2022 1315   CHOLHDL 4 03/26/2023 1055   VLDL 28.4 03/26/2023 1055   LDLCALC 109 (H) 03/26/2023 1055   LDLCALC 103 (H) 11/28/2022 1315   LDLDIRECT 92.0 04/20/2014 0930   Hepatic  Function Panel     Component Value Date/Time   PROT 6.6 03/26/2023 1055   PROT 6.7 12/20/2022 1122   ALBUMIN 4.0 03/26/2023 1055   ALBUMIN 4.1 12/20/2022 1122   AST 19 03/26/2023 1055   ALT 14 03/26/2023 1055   ALKPHOS 91 03/26/2023 1055   BILITOT 0.6 03/26/2023 1055   BILITOT 0.7 12/20/2022 1122   BILIDIR 0.2 04/18/2021 0746   IBILI 0.8 04/18/2021 0746      Component Value Date/Time   TSH 0.26 (L) 03/26/2023 1055  Nutritional Lab Results  Component Value Date   VD25OH 67.4 11/28/2022   VD25OH 68.44 02/15/2022   VD25OH 56.7 11/26/2017    Attestations:   I, Vernell Forest, acting as a medical scribe for Frances Jenkins, DO., have compiled all relevant documentation for today's office visit on behalf of Frances Jenkins, DO, while in the presence of [employer], DO.  Reviewed by clinician on day of visit: allergies, medications, problem list, medical history, surgical history, family history, social history, and previous encounter notes pertinent to patient's obesity diagnosis.  I have spent 50 minutes in the care of the patient today including: preparing to see patient (e.g. review and interpretation of tests, old notes ), obtaining and/or reviewing separately obtained history, performing a medically appropriate examination or evaluation, counseling and educating the patient, ordering medications, test or procedures, documenting clinical information in the electronic or other health care record, and independently interpreting results and communicating results to the patient, family, or caregiver   I have reviewed the above documentation for accuracy and completeness, and I agree with the above. Frances JINNY Hoffman, D.O.  The 21st Century Cures Act was signed into law in 2016 which includes the topic of electronic health records.  This provides immediate access to information in MyChart.  This includes consultation notes, operative notes, office notes, lab results and pathology  reports.  If you have any questions about what you read please let us  know at your next visit so we can discuss your concerns and take corrective action if need be.  We are right here with you.

## 2023-04-10 ENCOUNTER — Ambulatory Visit (INDEPENDENT_AMBULATORY_CARE_PROVIDER_SITE_OTHER): Payer: Medicare HMO | Admitting: Family Medicine

## 2023-04-10 NOTE — Telephone Encounter (Signed)
 Can you please advise?

## 2023-04-10 NOTE — Telephone Encounter (Signed)
 Notified patient of Dr. Carolene Chute recommendations. Patient stated that diarrhea was better due to her taking imodium.

## 2023-04-23 ENCOUNTER — Encounter: Payer: Self-pay | Admitting: Family

## 2023-04-23 ENCOUNTER — Telehealth: Payer: Self-pay

## 2023-04-23 ENCOUNTER — Ambulatory Visit: Payer: Self-pay | Admitting: Family

## 2023-04-23 DIAGNOSIS — T310 Burns involving less than 10% of body surface: Secondary | ICD-10-CM

## 2023-04-23 MED ORDER — SILVER SULFADIAZINE 1 % EX CREA: 1.0000 | TOPICAL_CREAM | Freq: Every day | CUTANEOUS | 0 refills | Status: DC

## 2023-04-23 NOTE — Telephone Encounter (Signed)
 Call to patient, in agreement change to VV due to weather

## 2023-04-23 NOTE — Addendum Note (Signed)
 Addended by: [name] on: 04/23/2023 02:53 PM   Modules accepted: Orders

## 2023-04-23 NOTE — Telephone Encounter (Signed)
  Chief Complaint: thermal burn Symptoms: quarter sized burn to right wrist/forearm Frequency: occurred about 10-15 minutes prior to triage call Pertinent Negatives: Patient denies blistering, headache, nausea. Disposition: [] ED /[] Urgent Care (no appt availability in office) / [] Appointment(In office/virtual)/ []  [facility]/ [x] Home Care/ [] Refused Recommended Disposition /[] [facility]/ []  Follow-up with PCP Additional Notes: Patient calling in requesting home care advice. She states she wants to make sure she is taking care of her burn properly. She was cooking chili, reheating it in a pot and it splashed up on her right arm. She states she has been running the burn under cold water and applying a cool rag. Patient educated on signs of infection and burn home care advice. Verbalizes understanding and advised to call back for new or worsening symptoms.  Copied from CRM [id number]. Topic: Clinical - Medical Advice >> Apr 23, 2023  1:05 PM Irine Seal wrote: Reason for CRM: patient burnt their hand, while cooking, she stated it is stinging and burning, patient denies any blistering at this time. She is seeking medical advice on the best way to handle the burn. Reason for Disposition  Minor thermal burn  Answer Assessment - Initial Assessment Questions 1. ONSET: "When did it happen?" If happened < 3 hours ago, ask: "Did you apply cold water?" If not, give First Aid Advice immediately.      10-15 minutes ago. She states she has been running it under cold water and has only placed a cold rag on it. She states it doesn't appear red yet, feels "a little warm".  2. LOCATION: "Where is the burn located?"      Right forearm near wrist.  3. BURN SIZE: "How large is the burn?"  The palm is roughly 1% of the total body surface area (BSA).     Size of a quarter.  4. SEVERITY OF THE BURN: "Are there any blisters?"      Denies any blisters.  5. MECHANISM: "Tell me how it happened."      She states she was heating up a pot of chili which has been frozen. She states she added water and went to push the chili down and it splashed up on her.  6. PAIN: "Are you having any pain?" "How bad is the pain?" (Scale 1-10; or mild, moderate, severe)   - MILD (1-3): doesn't interfere with normal activities    - MODERATE (4-7): interferes with normal activities or awakens from sleep    - SEVERE (8-10): excruciating pain, unable to do any normal activities      Stinging pain, 3-4/10.  7. INHALATION INJURY: "Were you exposed to any smoke or fumes?" If Yes, ask: "Do you have any cough or difficulty breathing?"     Denies.  8. OTHER SYMPTOMS: "Do you have any other symptoms?" (e.g., headache, nausea)     Denies.  Protocols used: Lawerance Bach [facility]

## 2023-04-25 ENCOUNTER — Telehealth: Payer: Self-pay

## 2023-04-25 ENCOUNTER — Encounter: Payer: Self-pay | Admitting: Neurology

## 2023-04-25 ENCOUNTER — Ambulatory Visit: Payer: Medicare HMO | Admitting: Neurology

## 2023-04-25 ENCOUNTER — Ambulatory Visit: Payer: Self-pay | Admitting: Family

## 2023-04-25 VITALS — BP 124/70 | HR 73 | Ht 64.0 in | Wt 179.8 lb

## 2023-04-25 DIAGNOSIS — R4189 Other symptoms and signs involving cognitive functions and awareness: Secondary | ICD-10-CM

## 2023-04-25 DIAGNOSIS — I679 Cerebrovascular disease, unspecified: Secondary | ICD-10-CM

## 2023-04-25 NOTE — Telephone Encounter (Signed)
 Call to patient, husband states she is still sleeping. He doesn't think they can do a VV, offered in person 2 pm today and scheduled patient.

## 2023-04-25 NOTE — Progress Notes (Signed)
 Chief Complaint  Patient presents with   Room 15    Pt is here with her Husband. Pt states that she worries about everything. Pt states that she gets scared a lot about her health, and getting sick. Pt states that she worries about her sister due to her being sick and living 2 hours away.      ASSESSMENT AND PLAN  Frances Hoffman is a 83 y.o. female    Dementia  Slow worsening, MRI of the brain showed moderate small vessel disease,    Family history of dementia, Parkinson's disease,  Consistent with central nervous system degenerative disorder, with vascular component  Complains of GI side effect with Namenda, it is okay to give her a trial without Namenda,  Husband is concerned about her ability to drive, do have visual spatial disorientation, advised her to drive short distance during daylight only  Cerebrovascular disease  Vascular risk factor of aging, sedentary lifestyle, hypertension, hyperlipidemia, diabetes  Encourage moderate exercise  Aspirin 81 mg     MEDICAL HISTORY:  Frances Hoffman is a 83 year old female, seen in request by her primary care physician Dr. Selena Batten, Chryl Heck, for evaluation of memory loss, she is accompanied by her husband at today's visit November 24, 2020  I reviewed and summarized the referring note.  Past medical history Breast cancer in 2000, left lobectomy, radiation Depression GERD Hyperlipidemia Hypothyroidism  I saw her more than 3 years ago, for complaints of memory loss  Family history of memory loss, father had Parkinson's disease, memory loss, sister also suffered dementia  Since 2018 she was noted to have mild memory loss, gradually getting worse, especially short-term memory, MoCA examination 25/30, previously complains of mood swings, now stabilized with Lexapro 20 mg daily, she is still driving, but got lost couple times,  MRI of the brain in February 2019, mild atrophy, moderate supratentorium small vessel disease  Laboratory  evaluation showed normal TSH B12, CMP, CBC, She has been taking Aricept, 10 mg daily, tolerating it well,  UPDATE Jan 04 2022: She is accompanied by her husband at today's clinical visit, overall doing well, likes to read, but is getting difficult for her to follow the story line, more time watching TV, like to do word puzzles  She sleeps okay, but woke up 1-2 times every night to use bathroom  UPDATE Apr 25 2023: She is accompanied by her husband at today's clinical visit, more sedentary lifestyle enjoy reading books, rarely drive, continue have slow worsening memory loss, difficulty sleeping sometimes, tried melatonin he does not work, taking frequent Tylenol PM, recently given prescription of trazodone  Again reviewed MRI of the brain without contrast from December 2024, no acute abnormality, extensive small vessel disease, chronic infarction in the inferior left cerebellum,  Echocardiogram from December 2024 showed no significant abnormality Ultrasound of carotid artery showed no significant large vessel disease  For a while she suffered frequent dizziness, low blood pressure systolic in 80s, was given fludrocortisone by her cardiologist Dr. Melburn Popper, which has been helpful,  PHYSICAL EXAM:   Vitals:   04/25/23 1412  BP: 124/70  Pulse: 73  Weight: 179 lb 12.8 oz (81.6 kg)  Height: 5\' 4"  (1.626 m)   BP 120/80,  Body mass index is 30.86 kg/m.  PHYSICAL EXAMNIATION:  Gen: NAD, conversant, well nourised, well groomed                     Cardiovascular: Regular rate rhythm, no peripheral edema,  warm, nontender. Eyes: Conjunctivae clear without exudates or hemorrhage Neck: Supple, no carotid bruits. Pulmonary: Clear to auscultation bilaterally   NEUROLOGICAL EXAM:  MENTAL STATUS: Speech/cognition: Awake, alert oriented to history taking and casual conversation    04/25/2023    2:15 PM 01/17/2023    8:16 AM 01/04/2022    1:40 PM 11/24/2020   10:55 AM 08/23/2020    3:21 PM   Montreal Cognitive Assessment   Visuospatial/ Executive (0/5) 3 2 4 5 1   Naming (0/3) 1 2 2 3 2   Attention: Read list of digits (0/2) 2 1 1 2 2   Attention: Read list of letters (0/1) 1 1 1 1 1   Attention: Serial 7 subtraction starting at 100 (0/3) 1 0 3 3 1   Language: Repeat phrase (0/2) 2 1 2 1 2   Language : Fluency (0/1) 0 0 0 1 0  Abstraction (0/2) 2 1 2 2 2   Delayed Recall (0/5) 1 2 2 1 2   Orientation (0/6) 3 5 6 6 6   Total 16 15 23 25 19   Adjusted Score (based on education) 17  23 25       CRANIAL NERVES: CN II: Visual fields are full to confrontation.  Pupils are round equal and briskly reactive to light. CN III, IV, VI: extraocular movement are normal. No ptosis. CN V: Facial sensation is intact to pinprick in all 3 divisions bilaterally. Corneal responses are intact.  CN VII: Face is symmetric with normal eye closure and smile. CN VIII: Hearing is normal to casual conversation CN IX, X: Palate elevates symmetrically. Phonation is normal. CN XI: Head turning and shoulder shrug are intact CN XII: Tongue is midline with normal movements and no atrophy.  MOTOR: There is no pronator drift of out-stretched arms. Muscle bulk and tone are normal. Muscle strength is normal.  REFLEXES: Reflexes are 1 and symmetric at the biceps, triceps, knees, and ankles. Plantar responses are flexor.  SENSORY: Intact to light touch, pinprick, positional and vibratory sensation are intact in fingers and toes.  COORDINATION: Rapid alternating movements and fine finger movements are intact. There is no dysmetria on finger-to-nose and heel-knee-shin.    GAIT/STANCE: To get up from seated position, cautious    REVIEW OF SYSTEMS:  Full 14 system review of systems performed and notable only for as above All other review of systems were negative.   ALLERGIES: Allergies  Allergen Reactions   Ciprofloxacin Other (See Comments)    Unknown (vertigo??)   Citalopram Hydrobromide Other (See  Comments)    Intrusive/ odd thoughts    Flagyl [Metronidazole] Other (See Comments)    Unknown??   Milk-Related Compounds    Oxycodone Other (See Comments)    Headaches    HOME MEDICATIONS: Current Outpatient Medications  Medication Sig Dispense Refill   acetaminophen (TYLENOL) 500 MG tablet Take 500 mg by mouth every 6 (six) hours as needed.     aspirin-sod bicarb-citric acid (ALKA-SELTZER) 325 MG TBEF tablet Take 325 mg by mouth every 6 (six) hours as needed.     atorvastatin (LIPITOR) 80 MG tablet Take 1 tablet (80 mg total) by mouth daily. 90 tablet 3   calcium carbonate (OS-CAL - DOSED IN MG OF ELEMENTAL CALCIUM) 1250 (500 CA) MG tablet Take 1 tablet by mouth 2 (two) times daily with a meal.     carboxymethylcellulose (REFRESH PLUS) 0.5 % SOLN Place 1 drop into both eyes 3 (three) times daily as needed (for dryness).     Cholecalciferol (VITAMIN D3) 2000 UNITS  capsule Take 2,000 Units by mouth daily.     Cinnamon 500 MG TABS Take 500 mg by mouth daily.     escitalopram (LEXAPRO) 20 MG tablet Take 1 tablet (20 mg total) by mouth daily. 90 tablet 4   fludrocortisone (FLORINEF) 0.1 MG tablet Take 1 tablet (0.1 mg total) by mouth daily. 30 tablet 2   levothyroxine (SYNTHROID) 88 MCG tablet Take 1 tablet (88 mcg total) by mouth daily. 90 tablet 3   meclizine (ANTIVERT) 50 MG tablet Take 1 tablet (50 mg total) by mouth 3 (three) times daily as needed. 30 tablet 0   memantine (NAMENDA) 10 MG tablet Take 1 tablet (10 mg total) by mouth 2 (two) times daily. 180 tablet 3   metFORMIN (GLUCOPHAGE) 500 MG tablet Take 1 tablet (500 mg total) by mouth 2 (two) times daily with a meal. 180 tablet 0   omeprazole (PRILOSEC) 20 MG capsule Take 20 mg by mouth daily.     traZODone (DESYREL) 50 MG tablet Take 1/2 tablet po at bedtime 30 tablet 3   cyanocobalamin ([employer] VITAMIN B12) 2000 MCG tablet Take 1 tablet (2,000 mcg total) by mouth daily. (Patient not taking: Reported on 04/25/2023)     silver  sulfADIAZINE (SILVADENE) 1 % cream Apply 1 Application topically daily. (Patient not taking: Reported on 04/25/2023) 50 g 0   No current facility-administered medications for this visit.    PAST MEDICAL HISTORY: Past Medical History:  Diagnosis Date   Adjustment disorder with mixed anxiety and depressed mood 12/12/2007   Qualifier: Diagnosis of  By: Copland MD, Spencer     B12 deficiency    Breast cancer (HCC)    Cancer (HCC) 2000   breast had lumpectomy/radiation x36,mammo ,no chemo   Cataract of both eyes    Depression    Dysuria-frequency syndrome    takes AZO   Family history of heart disease 08/27/2013   Frozen shoulder    Gall stones    GERD 12/12/2007   Qualifier: Diagnosis of  By: Copland MD, Spencer     Hyperlipidemia    Hypothyroidism 12/12/2007   Qualifier: Diagnosis of  By: Patsy Lager MD, Spencer     Lactose intolerance    Memory difficulties 09/27/2017   Memory loss    Memory loss 09/22/2014   Osteopenia    BMD 2004, WNL 2008   Personal history of radiation therapy    Pneumonia    Recurrent UTI    Shingles    chronic body pain, left side of body   Shortness of breath dyspnea    when climbing stairs only   Vitamin D deficiency    Wears glasses     PAST SURGICAL HISTORY: Past Surgical History:  Procedure Laterality Date   BREAST LUMPECTOMY Left 2000   CHOLECYSTECTOMY N/A 02/21/2015   Procedure: LAPAROSCOPIC CHOLECYSTECTOMY WITH INTRAOPERATIVE CHOLANGIOGRAM;  Surgeon: Gaynelle Adu, MD;  Location: [facility] OR;  Service: General;  Laterality: N/A;   COLONOSCOPY     MULTIPLE TOOTH EXTRACTIONS      FAMILY HISTORY: Family History  Problem Relation Age of Onset   Osteoporosis Mother    Hypertension Mother    Sleep apnea Mother    Obesity Mother    Parkinsonism Father    Breast cancer Sister 100   Coronary artery disease Sister    Ovarian cancer Sister    Heart disease Son    Heart attack Son 63   Heart disease Maternal Grandmother    Heart disease Maternal  Grandfather  Heart attack Maternal Grandfather 50   Breast cancer Other     SOCIAL HISTORY: Social History   Socioeconomic History   Marital status: Married    Spouse name: Dwight    Number of children: 2   Years of education: High school   Highest education level: High school graduate  Occupational History   Not on file  Tobacco Use   Smoking status: Never   Smokeless tobacco: Never  Vaping Use   Vaping status: Never Used  Substance and Sexual Activity   Alcohol use: No    Alcohol/week: 0.0 standard drinks of alcohol   Drug use: No   Sexual activity: Yes    Partners: Male    Birth control/protection: Post-menopausal  Other Topics Concern   Not on file  Social History Narrative   Lives at home with her husband. - Dwight   2 adult sons  - [place] and Minerva Areola   2 Grandchildren - both other   Exercise: goes to [employer] sne[employer] - at least 2 times a week   Diet: anything that tastes good, less pepsi's with the tongue   Enjoys: square dance, eating out with friends, going to church - Sunday and Wednesday   Social Drivers of Health   Financial Resource Strain: Low Risk  (01/28/2023)   Overall Financial Resource Strain (CARDIA)    Difficulty of Paying Living Expenses: Not hard at all  Food Insecurity: No Food Insecurity (01/28/2023)   Hunger Vital Sign    Worried About Running Out of Food in the Last Year: Never true    Ran Out of Food in the Last Year: Never true  Transportation Needs: No Transportation Needs (01/28/2023)   PRAPARE - Transportation    Lack of Transportation (Medical): No    Lack of Transportation (Non-Medical): No  Physical Activity: Insufficiently Active (01/28/2023)   Exercise Vital Sign    Days of Exercise per Week: 3 days    Minutes of Exercise per Session: 10 min  Stress: No Stress Concern Present (01/28/2023)   [employer] - Occupational Stress Questionnaire    Feeling of Stress : Not at all  Social Connections:  Moderately Integrated (01/28/2023)   Social Connection and Isolation Panel [NHANES]    Frequency of Communication with Friends and Family: More than three times a week    Frequency of Social Gatherings with Friends and Family: Once a week    Attends Religious Services: More than 4 times per year    Active Member of Clubs or Organizations: No    Attends Club or Organization Meetings: Never    Marital Status: Married  Intimate Partner Violence: Not At Risk (01/28/2023)   Humiliation, Afraid, Rape, and Kick questionnaire    Fear of Current or Ex-Partner: No    Emotionally Abused: No    Physically Abused: No    Sexually Abused: No    Zeya Balles, M.D. Ph.D.  [facility] [address] [place], [place] [address] Phone: [phone] Fax:      [phone]

## 2023-04-25 NOTE — Telephone Encounter (Signed)
 Noted

## 2023-04-25 NOTE — Telephone Encounter (Signed)
 Copied From CRM [id number]. Reason for Triage: Excessive diarrhea  - Please call patient back on mobile number to schedule.   Chief Complaint: diarrhea  Symptoms:diarrhea Frequency: comes and goes, no diarrhea today Pertinent Negatives: Patient denies pain, abd pain, n/v Disposition: [] ED /[] Urgent Care (no appt availability in office) / [x] Appointment(In office/virtual)/ []  [facility]/ [] Home Care/ [] Refused Recommended Disposition /[] [facility]/ []  Follow-up with PCP Additional Notes: apt made for tomorrow, care advice given, denies questions.  Instructed to go to er if becomes worse.     Reason for Disposition  [1] Mild diarrhea (e.g., 1-3 or more stools than normal in past 24 hours) without known cause AND [2] present >  7 days  Answer Assessment - Initial Assessment Questions 1. DIARRHEA SEVERITY: "How bad is the diarrhea?" "How many more stools have you had in the past 24 hours than normal?"    - NO DIARRHEA (SCALE 0)   - MILD (SCALE 1-3): Few loose or mushy BMs; increase of 1-3 stools over normal daily number of stools; mild increase in ostomy output.   -  MODERATE (SCALE 4-7): Increase of 4-6 stools daily over normal; moderate increase in ostomy output.   -  SEVERE (SCALE 8-10; OR "WORST POSSIBLE"): Increase of 7 or more stools daily over normal; moderate increase in ostomy output; incontinence.     Yesterday went 4-5 times 2. ONSET: "When did the diarrhea begin?"      yesterday 3. BM CONSISTENCY: "How loose or watery is the diarrhea?"      loose 4. VOMITING: "Are you also vomiting?" If Yes, ask: "How many times in the past 24 hours?"      denies 5. ABDOMEN PAIN: "Are you having any abdomen pain?" If Yes, ask: "What does it feel like?" (e.g., crampy, dull, intermittent, constant)      A little bit, 5/10 6. ABDOMEN PAIN SEVERITY: If present, ask: "How bad is the pain?"  (e.g., Scale 1-10; mild, moderate, or severe)   - MILD (1-3): doesn't interfere with  normal activities, abdomen soft and not tender to touch    - MODERATE (4-7): interferes with normal activities or awakens from sleep, abdomen tender to touch    - SEVERE (8-10): excruciating pain, doubled over, unable to do any normal activities       5/10 7. ORAL INTAKE: If vomiting, "Have you been able to drink liquids?" "How much liquids have you had in the past 24 hours?"     denies 8. HYDRATION: "Any signs of dehydration?" (e.g., dry mouth [not just dry lips], too weak to stand, dizziness, new weight loss) "When did you last urinate?"     denies 9. EXPOSURE: "Have you traveled to a foreign country recently?" "Have you been exposed to anyone with diarrhea?" "Could you have eaten any food that was spoiled?"     denies 10. ANTIBIOTIC USE: "Are you taking antibiotics now or have you taken antibiotics in the past 2 months?"       unknown 11. OTHER SYMPTOMS: "Do you have any other symptoms?" (e.g., fever, blood in stool)       denies 12. PREGNANCY: "Is there any chance you are pregnant?" "When was your last menstrual period?"       na  Protocols used: [facility]

## 2023-04-26 ENCOUNTER — Ambulatory Visit: Payer: Medicare HMO | Admitting: Family Medicine

## 2023-04-29 ENCOUNTER — Encounter: Payer: Self-pay | Admitting: Family

## 2023-05-06 ENCOUNTER — Encounter: Payer: Self-pay | Admitting: Cardiovascular Disease

## 2023-05-06 NOTE — Progress Notes (Unsigned)
  Cardiology Office Note:  .   Date:  05/07/2023  ID:  Frances Hoffman, DOB 06-Feb-1941, MRN [id number] PCP: [name], FNP  [facility] Providers Cardiologist:  [name], MD    History of Present Illness: .   Frances Hoffman is a 83 y.o. female with orthostatic hypotension  We are asked to see her for further evaluation of her orthostatic hypotension  Seen with [name] .  Here to discuss her orthostatic symptoms Has been present for several months , seems to be getting worse Trys to drink water .   Was started on Florinef. Has not helped ( 0.05 mg a day )   No Cp , no dyspnea   Exercises 4 times a week ( exercise bike    Echo in 2019 showed normal LV function,  grade I DD  Trivial MR, trivial TR , trivial PI  Repeat echo has been ordered   Increase your intake of fluids (V8 juice,  water with electrolyte supplements like Liquid IV,   Nuun tablets, ) , protein ( hard boiled eggs, chicken, fish) ,     May 07, 2023 Kriste Basque is seen for follow up f her orthostatic hypotension We added florinef at her last visit  She is somewhat better  Is no longer falling when she stands up   Eating and drinking ok .       ROS:   Studies Reviewed: .         Risk Assessment/Calculations:      Physical Exam:     Physical Exam: Blood pressure 110/74, pulse 88, height 5\' 4"  (1.626 m), weight 177 lb 6.4 oz (80.5 kg), SpO2 93%.     GEN:  Well nourished, well developed in no acute distress HEENT: Normal NECK: No JVD; No carotid bruits LYMPHATICS: No lymphadenopathy CARDIAC: RRR , no murmurs, rubs, gallops RESPIRATORY:  Clear to auscultation without rales, wheezing or rhonchi  ABDOMEN: Soft, non-tender, non-distended MUSCULOSKELETAL:  No edema; No deformity  SKIN: Warm and dry NEUROLOGIC:  Alert and oriented x 3   ASSESSMENT AND PLAN: .   Orthostatic hypotension:   She is still having some lightheadedness with standing but overall her symptoms are better.   She has not had any recent falls.  Will continue the Florinef at the current dose.  I have encouraged her to hydrate better.  I have instructed her to add 1/4 teaspoon of Morton's lite salt to her water.  She can add Mio flavoring  to flavor the water .  She would like to come back as needed.  This will be fine if her primary medical doctor is willing to prescribe the current dose of Florinef.  Will be happy to see her if there are any clinical changes.        Dispo:   Signed, [name], MD

## 2023-05-07 ENCOUNTER — Encounter: Payer: Self-pay | Admitting: Family

## 2023-05-07 ENCOUNTER — Encounter: Payer: Self-pay | Admitting: Cardiovascular Disease

## 2023-05-07 ENCOUNTER — Ambulatory Visit: Payer: Medicare HMO | Attending: Cardiovascular Disease | Admitting: Cardiovascular Disease

## 2023-05-07 ENCOUNTER — Other Ambulatory Visit (INDEPENDENT_AMBULATORY_CARE_PROVIDER_SITE_OTHER): Payer: Medicare HMO

## 2023-05-07 VITALS — BP 110/74 | HR 88 | Ht 64.0 in | Wt 177.4 lb

## 2023-05-07 DIAGNOSIS — E039 Hypothyroidism, unspecified: Secondary | ICD-10-CM | POA: Diagnosis not present

## 2023-05-07 DIAGNOSIS — I951 Orthostatic hypotension: Secondary | ICD-10-CM | POA: Diagnosis not present

## 2023-05-07 LAB — TSH: TSH: 2.83 u[IU]/mL (ref 0.35–5.50)

## 2023-05-07 NOTE — Patient Instructions (Signed)
 Follow-Up: At [facility], you and your health needs are our priority.  As part of our continuing mission to provide you with exceptional heart care, we have created designated Provider Care Teams.  These Care Teams include your primary Cardiologist (physician) and Advanced Practice Providers (APPs -  Physician Assistants and Nurse Practitioners) who all work together to provide you with the care you need, when you need it.  Your next appointment:   As Needed  Provider:   [name], MD      1st Floor: - Lobby - Registration  - Pharmacy  - Lab - Cafe  2nd Floor: - PV Lab - Diagnostic Testing (echo, CT, nuclear med)  3rd Floor: - Vacant  4th Floor: - TCTS (cardiothoracic surgery) - AFib Clinic - Structural Heart Clinic - Vascular Surgery  - Vascular Ultrasound  5th Floor: - HeartCare Cardiology (general and EP) - Clinical Pharmacy for coumadin, hypertension, lipid, weight-loss medications, and med management appointments    Valet parking services will be available as well.

## 2023-05-13 DIAGNOSIS — K580 Irritable bowel syndrome with diarrhea: Secondary | ICD-10-CM | POA: Diagnosis not present

## 2023-05-15 ENCOUNTER — Telehealth: Payer: Self-pay

## 2023-05-15 NOTE — Telephone Encounter (Signed)
 Spoke with pt. Advised her that I do not see where anyone from our office called her. Apologized for the inconvenience. Pt did not need anything from [place] at this time.

## 2023-05-15 NOTE — Telephone Encounter (Signed)
 Copied from CRM [id number]. Topic: General - Other >> May 15, 2023 11:17 AM Elizebeth Brooking wrote: Reason for CRM: Received a missed all from number, patient stated no voicemail would like for someone to give a callback regarding this

## 2023-05-31 ENCOUNTER — Ambulatory Visit (INDEPENDENT_AMBULATORY_CARE_PROVIDER_SITE_OTHER): Admitting: Family Medicine

## 2023-05-31 ENCOUNTER — Encounter: Payer: Self-pay | Admitting: Family Medicine

## 2023-05-31 VITALS — BP 90/64 | HR 95 | Temp 97.5°F | Ht 64.0 in | Wt 171.2 lb

## 2023-05-31 DIAGNOSIS — R531 Weakness: Secondary | ICD-10-CM

## 2023-05-31 DIAGNOSIS — J3489 Other specified disorders of nose and nasal sinuses: Secondary | ICD-10-CM

## 2023-05-31 LAB — POC COVID19 BINAXNOW: SARS Coronavirus 2 Ag: NEGATIVE

## 2023-05-31 NOTE — Progress Notes (Signed)
 Patient ID: Frances Hoffman, female    DOB: [birth year], 83 y.o.   MRN: [id number]  This visit was conducted in person.  BP 90/64 (BP Location: Left Arm, Patient Position: Sitting, Cuff Size: Large)   Pulse 95   Temp (!) 97.5 F (36.4 C) (Temporal)   Ht 5\' 4"  (1.626 m)   Wt 171 lb 4 oz (77.7 kg)   SpO2 95%   BMI 29.39 kg/m    CC:  Chief Complaint  Patient presents with   Facial Pain    Started on Wednesday   Nausea   Dizziness   Nasal Congestion    Runny nose but that is normal for her    Subjective:   HPI: Frances Hoffman is a 83 y.o. female presenting on 05/31/2023 for Facial Pain (Started on Wednesday), Nausea, Dizziness, and Nasal Congestion (Runny nose but that is normal for her)  Has chronic runny nose Date of onset: 2 days Initial symptoms included  right face pain.. no nasal discharge  Occ cold sweat, does feel good overall. Ear fullness... ongoing  for a while.. neg ENT   No fever.  Mild  cough , no ST   No sneeze, no ithcy eyes.  No SOB, no wheeze.  Feel nausea, weak and tired, dizzy.    Sick contacts:  none COVID testing:   none     She has tried to treat with  Claritin D      No history of chronic lung disease such as asthma or COPD. Non-smoker.       Relevant past medical, surgical, family and social history reviewed and updated as indicated. Interim medical history since our last visit reviewed. Allergies and medications reviewed and updated. Outpatient Medications Prior to Visit  Medication Sig Dispense Refill   acetaminophen (TYLENOL) 500 MG tablet Take 500 mg by mouth every 6 (six) hours as needed.     aspirin EC 81 MG tablet Take 81 mg by mouth daily. Swallow whole.     aspirin-sod bicarb-citric acid (ALKA-SELTZER) 325 MG TBEF tablet Take 325 mg by mouth every 6 (six) hours as needed.     atorvastatin (LIPITOR) 80 MG tablet Take 1 tablet (80 mg total) by mouth daily. 90 tablet 3   calcium carbonate (OS-CAL - DOSED IN MG OF ELEMENTAL  CALCIUM) 1250 (500 CA) MG tablet Take 1 tablet by mouth 2 (two) times daily with a meal.     carboxymethylcellulose (REFRESH PLUS) 0.5 % SOLN Place 1 drop into both eyes 3 (three) times daily as needed (for dryness).     Cholecalciferol (VITAMIN D3) 2000 UNITS capsule Take 2,000 Units by mouth daily.     Cinnamon 500 MG TABS Take 500 mg by mouth daily.     COLESTID 1 g tablet Take 2 g by mouth 2 (two) times daily. For 30 days     cyanocobalamin ([employer] VITAMIN B12) 2000 MCG tablet Take 1 tablet (2,000 mcg total) by mouth daily.     escitalopram (LEXAPRO) 20 MG tablet Take 1 tablet (20 mg total) by mouth daily. 90 tablet 4   fludrocortisone (FLORINEF) 0.1 MG tablet Take 1 tablet (0.1 mg total) by mouth daily. 30 tablet 2   levothyroxine (SYNTHROID) 88 MCG tablet Take 1 tablet (88 mcg total) by mouth daily. 90 tablet 3   loperamide (IMODIUM A-D) 2 MG capsule Take 2 mg by mouth daily as needed.     meclizine (ANTIVERT) 50 MG tablet Take 1 tablet (50 mg  total) by mouth 3 (three) times daily as needed. 30 tablet 0   memantine (NAMENDA) 10 MG tablet Take 1 tablet (10 mg total) by mouth 2 (two) times daily. 180 tablet 3   metFORMIN (GLUCOPHAGE) 500 MG tablet Take 1 tablet (500 mg total) by mouth 2 (two) times daily with a meal. 180 tablet 0   omeprazole (PRILOSEC) 20 MG capsule Take 20 mg by mouth daily.     silver sulfADIAZINE (SILVADENE) 1 % cream Apply 1 Application topically daily. 50 g 0   traZODone (DESYREL) 50 MG tablet Take 1/2 tablet po at bedtime 30 tablet 3   No facility-administered medications prior to visit.     Per HPI unless specifically indicated in ROS section below Review of Systems  Constitutional:  Positive for fatigue. Negative for fever.  HENT:  Positive for sinus pressure. Negative for congestion.   Eyes:  Negative for pain.  Respiratory:  Negative for cough and shortness of breath.   Cardiovascular:  Negative for chest pain, palpitations and leg swelling.  Gastrointestinal:   Negative for abdominal pain.  Genitourinary:  Negative for dysuria and vaginal bleeding.  Musculoskeletal:  Negative for back pain.  Neurological:  Negative for syncope, light-headedness and headaches.  Psychiatric/Behavioral:  Negative for dysphoric mood.    Objective:  BP 90/64 (BP Location: Left Arm, Patient Position: Sitting, Cuff Size: Large)   Pulse 95   Temp (!) 97.5 F (36.4 C) (Temporal)   Ht 5\' 4"  (1.626 m)   Wt 171 lb 4 oz (77.7 kg)   SpO2 95%   BMI 29.39 kg/m   Wt Readings from Last 3 Encounters:  05/31/23 171 lb 4 oz (77.7 kg)  05/07/23 177 lb 6.4 oz (80.5 kg)  04/25/23 179 lb 12.8 oz (81.6 kg)      Physical Exam Constitutional:      General: She is not in acute distress.    Appearance: She is well-developed. She is not ill-appearing or toxic-appearing.  HENT:     Head: Normocephalic.     Right Ear: Hearing, tympanic membrane, ear canal and external ear normal. Tympanic membrane is not erythematous, retracted or bulging.     Left Ear: Hearing, tympanic membrane, ear canal and external ear normal. Tympanic membrane is not erythematous, retracted or bulging.     Nose: Mucosal edema present. No rhinorrhea.     Right Sinus: Maxillary sinus tenderness present. No frontal sinus tenderness.     Left Sinus: No maxillary sinus tenderness or frontal sinus tenderness.     Mouth/Throat:     Pharynx: Uvula midline.  Eyes:     General: Lids are normal. Lids are everted, no foreign bodies appreciated.     Conjunctiva/sclera: Conjunctivae normal.     Pupils: Pupils are equal, round, and reactive to light.  Neck:     Thyroid: No thyroid mass or thyromegaly.     Vascular: No carotid bruit.     Trachea: Trachea normal.  Cardiovascular:     Rate and Rhythm: Normal rate and regular rhythm.     Pulses: Normal pulses.     Heart sounds: Normal heart sounds, S1 normal and S2 normal. No murmur heard.    No friction rub. No gallop.  Pulmonary:     Effort: Pulmonary effort is  normal. No tachypnea or respiratory distress.     Breath sounds: Normal breath sounds. No decreased breath sounds, wheezing, rhonchi or rales.  Musculoskeletal:     Cervical back: Normal range of motion and neck  supple.  Skin:    General: Skin is warm and dry.     Findings: No rash.  Neurological:     Mental Status: She is alert.  Psychiatric:        Mood and Affect: Mood is not anxious or depressed.        Speech: Speech normal.        Behavior: Behavior normal. Behavior is cooperative.        Judgment: Judgment normal.       Results for orders placed or performed in visit on 05/31/23  POC COVID-19   Collection Time: 05/31/23 11:54 AM  Result Value Ref Range   SARS Coronavirus 2 Ag Negative Negative    Assessment and Plan  Sinus pressure Assessment & Plan: Acute, symptoms most consistent with allergic sinusitis. Given fatigue and weakness I went ahead and checked a COVID test which was negative. Will treat with Flonase 2 sprays per nostril daily along with Zyrtec or Xyzal at bedtime.  I recommended that she stop any decongestant given she has issues with chronic dry mouth.  Return and ER precautions provided. Go to ER if severe shortness of breath.   Weakness -     POC COVID-19 BinaxNow    No follow-ups on file.   Kerby Nora, MD

## 2023-05-31 NOTE — Patient Instructions (Addendum)
   Negative COVID test.  Most likely allergic sinusitis. Start Flonase 2 sprays per nostril daily.   Start Zyrtec  or  Xyzal at bedtime.. no decongestant.

## 2023-05-31 NOTE — Assessment & Plan Note (Signed)
 Acute, symptoms most consistent with allergic sinusitis. Given fatigue and weakness I went ahead and checked a COVID test which was negative. Will treat with Flonase 2 sprays per nostril daily along with Zyrtec or Xyzal at bedtime.  I recommended that she stop any decongestant given she has issues with chronic dry mouth.  Return and ER precautions provided. Go to ER if severe shortness of breath.

## 2023-06-04 ENCOUNTER — Encounter (INDEPENDENT_AMBULATORY_CARE_PROVIDER_SITE_OTHER): Payer: Self-pay | Admitting: Family Medicine

## 2023-06-04 ENCOUNTER — Ambulatory Visit (INDEPENDENT_AMBULATORY_CARE_PROVIDER_SITE_OTHER): Payer: Medicare HMO | Admitting: Family Medicine

## 2023-06-04 VITALS — BP 98/64 | HR 91 | Temp 97.3°F | Ht 64.0 in | Wt 168.0 lb

## 2023-06-04 DIAGNOSIS — E538 Deficiency of other specified B group vitamins: Secondary | ICD-10-CM

## 2023-06-04 DIAGNOSIS — I951 Orthostatic hypotension: Secondary | ICD-10-CM | POA: Diagnosis not present

## 2023-06-04 DIAGNOSIS — Z6828 Body mass index (BMI) 28.0-28.9, adult: Secondary | ICD-10-CM | POA: Diagnosis not present

## 2023-06-04 DIAGNOSIS — Z6829 Body mass index (BMI) 29.0-29.9, adult: Secondary | ICD-10-CM

## 2023-06-04 DIAGNOSIS — R7303 Prediabetes: Secondary | ICD-10-CM | POA: Diagnosis not present

## 2023-06-04 DIAGNOSIS — E669 Obesity, unspecified: Secondary | ICD-10-CM | POA: Diagnosis not present

## 2023-06-04 NOTE — Progress Notes (Signed)
 Frances Hoffman, D.O.  ABFM, ABOM Specializing in Clinical Bariatric Medicine  Office located at: [address] [place], [place]  [address]   Assessment and Plan:  No orders of the defined types were placed in this encounter.   Medications Discontinued During This Encounter  Medication Reason   cyanocobalamin ([employer] VITAMIN B12) 2000 MCG tablet      No orders of the defined types were placed in this encounter.     FOR THE DISEASE OF OBESITY:  BMI 29.0-29.9,adult - Current BMI 28.82 Obesity, Beginning BMI 32.40 Assessment & Plan: Since last office visit on 04/09/2023 patient's  Muscle mass has decreased by 2.8 lb. Fat mass has decreased by 1.6 lb. Total body water has decreased by 1.6 lb. Counseling done on how various foods will affect these numbers and how to maximize success  Total lbs lost to date: 12 lbs  Total weight loss percentage to date: 6.67%    Recommended Dietary Goals Angelia is currently in the action stage of change. As such, her goal is to continue weight management plan.  She has agreed to: continue current plan   Behavioral Intervention We discussed the following today: increasing fiber rich foods and continue to work on maintaining a reduced calorie state, getting the recommended amount of protein, incorporating whole foods, making healthy choices, staying well hydrated and practicing mindfulness when eating.  Additional resources provided today: Handout on CAT 1 meal plan   Evidence-based interventions for health behavior change were utilized today including the discussion of self monitoring techniques, problem-solving barriers and SMART goal setting techniques.   Regarding patient's less desirable eating habits and patterns, we employed the technique of small changes.   Pt will specifically work on: n/a   Recommended Physical Activity Goals [name] has been advised to work up to 150 minutes of moderate intensity aerobic activity a week and  strengthening exercises 2-3 times per week for cardiovascular health, weight loss maintenance and preservation of muscle mass.   She has agreed to : continue to gradually increase the amount and intensity of exercise routine   Pharmacotherapy We both agreed to :  continue medication regimen   FOR ASSOCIATED CONDITIONS ADDRESSED TODAY:  Prediabetes- with carb cravings Assessment & Plan: Most recent Hemoglobin A1c and fasting insulin: Lab Results  Component Value Date   HGBA1C 5.9 03/26/2023   HGBA1C 5.8 (H) 12/20/2022   HGBA1C 5.9 (H) 11/28/2022   INSULIN 18.7 12/20/2022   INSULIN 12.8 11/28/2022    Pt is on a regimen of Metformin 500 mg two times daily. Pt denies gastrointestinal issues. States that her hunger/cravings are controlled. Continue current medicine and balanced diet focusing on protein, fruits, and vegetables while limiting simple carbohydrates. Losing 10% or more of body weight may improve condition.    Orthostatic hypotension Assessment & Plan: Last 3 blood pressure readings in our office are as follows: BP Readings from Last 3 Encounters:  06/06/23 118/76  06/04/23 98/64  05/31/23 90/64   Managed by Dr.Nahser of Cardiology who she recently saw on 3/4. She is on Florinef 0.1 mg daily. Her blood pressure today was originally 88/52 when checked using the automatic machine; manual recheck showed 98/64 which is in the low normal range. Pt has a h/o chronic dizziness but presents with no new or worsening symptoms today. Her husband states her blood pressure was last checked at home 3-4 weeks ago & and it was 110/85.   Check blood pressure and pulse rate at home daily.  Adequate daily water intake encouraged (85+ ounces daily). Additionally pt reminded to add  1/4 teaspoon of Morton's lite salt to her water which Dr.Nahser recommended on 3/4. Pt understands to f/up with PCP or specialist if BP remains low at home or if she develops any new/worsening symptoms.    B12  deficiency Assessment & Plan: Most recent B12: Lab Results  Component Value Date   VITAMINB12 >1537 (H) 03/26/2023   Reports stopping her B12 supplementation after labs were reviewed by my colleague William Hamburger NP on 12/05/2022. Pt  will continue to hold off on supplementation. Recheck periodically.    Follow up:   Return 07/04/2023. She was informed of the importance of frequent follow up visits to maximize her success with intensive lifestyle modifications for her multiple health conditions.  Subjective:   Chief complaint: Obesity [name] is here to discuss her progress with her obesity treatment plan. She is on the Category 1 Plan with B/L options  and states she is following her eating plan approximately 100% of the time. She states she is using the stationary bike 5-10 minutes 3-4 days per week.  Interval History:  Frances Hoffman is here for a follow up office visit. She is accompanied by her husband. Since last OV on 04/09/2023, Mrs.Laning is down 5 lbs. Reports good adherence to her prudent nutritional plan. Reports snacking less. Her hunger and cravings are controlled.   Pharmacotherapy that aid with weight loss: She is currently taking  Metformin 500 mg two times daily .   Review of Systems:  Pertinent positives were addressed with patient today.  Reviewed by clinician on day of visit: allergies, medications, problem list, medical history, surgical history, family history, social history, and previous encounter notes.  Weight Summary and Biometrics   Weight Lost Since Last Visit: 5lb  Weight Gained Since Last Visit: 0   Vitals Temp: (!) 97.3 F (36.3 C) BP: (!) 88/52 Pulse Rate: 91 SpO2: 94 %   Anthropometric Measurements Height: 5\' 4"  (1.626 m) Weight: 168 lb (76.2 kg) BMI (Calculated): 28.82 Weight at Last Visit: 173lb Weight Lost Since Last Visit: 5lb Weight Gained Since Last Visit: 0 Starting Weight: 180lb Total Weight Loss (lbs): 12 lb (5.443  kg)   Body Composition  Body Fat %: 43.7 % Fat Mass (lbs): 73.8 lbs Muscle Mass (lbs): 90.2 lbs Total Body Water (lbs): 64.4 lbs Visceral Fat Rating : 13   Other Clinical Data Fasting: no Labs: no Today's Visit #: 9 Starting Date: 10/30/22    Objective:   PHYSICAL EXAM: Blood pressure (!) 88/52, pulse 91, temperature (!) 97.3 F (36.3 C), height 5\' 4"  (1.626 m), weight 168 lb (76.2 kg), SpO2 94%. Body mass index is 28.84 kg/m.  General: she is overweight, cooperative and in no acute distress. PSYCH: Has normal mood, affect and thought process.   HEENT: EOMI, sclerae are anicteric. Lungs: Normal breathing effort, no conversational dyspnea. Extremities: Moves * 4 Neurologic: A and O * 3, good insight  DIAGNOSTIC DATA REVIEWED: BMET    Component Value Date/Time   NA 141 03/26/2023 1055   NA 144 12/20/2022 1122   K 4.3 03/26/2023 1055   CL 103 03/26/2023 1055   CO2 27 03/26/2023 1055   GLUCOSE 73 03/26/2023 1055   BUN 11 03/26/2023 1055   BUN 12 12/20/2022 1122   CREATININE 0.89 03/26/2023 1055   CREATININE 0.98 (H) 02/10/2016 1035   CALCIUM 9.8 03/26/2023 1055   GFRNONAA 47 (L) 01/12/2023 1438   GFRNONAA  59 (L) 12/07/2015 0832   GFRAA 67 12/31/2017 1027   GFRAA 68 12/07/2015 0832   Lab Results  Component Value Date   HGBA1C 5.9 03/26/2023   HGBA1C 5.6 04/03/2014   Lab Results  Component Value Date   INSULIN 18.7 12/20/2022   INSULIN 12.8 11/28/2022   Lab Results  Component Value Date   TSH 2.83 05/07/2023   CBC    Component Value Date/Time   WBC 7.5 03/26/2023 1055   RBC 4.60 03/26/2023 1055   HGB 14.2 03/26/2023 1055   HGB 15.2 11/28/2022 1315   HCT 43.0 03/26/2023 1055   HCT 46.0 11/28/2022 1315   PLT 291.0 03/26/2023 1055   PLT 296 11/28/2022 1315   MCV 93.6 03/26/2023 1055   MCV 95 11/28/2022 1315   MCH 30.9 01/12/2023 1805   MCHC 33.1 03/26/2023 1055   RDW 14.7 03/26/2023 1055   RDW 12.9 11/28/2022 1315   Iron Studies     Component Value Date/Time   IRON 74 07/04/2016 0000   TIBC 319 07/04/2016 0000   FERRITIN 111.1 08/23/2020 1513   IRONPCTSAT 23 07/04/2016 0000   Lipid Panel     Component Value Date/Time   CHOL 182 03/26/2023 1055   CHOL 170 11/28/2022 1315   TRIG 142.0 03/26/2023 1055   HDL 44.20 03/26/2023 1055   HDL 38 (L) 11/28/2022 1315   CHOLHDL 4 03/26/2023 1055   VLDL 28.4 03/26/2023 1055   LDLCALC 109 (H) 03/26/2023 1055   LDLCALC 103 (H) 11/28/2022 1315   LDLDIRECT 92.0 04/20/2014 0930   Hepatic Function Panel     Component Value Date/Time   PROT 6.6 03/26/2023 1055   PROT 6.7 12/20/2022 1122   ALBUMIN 4.0 03/26/2023 1055   ALBUMIN 4.1 12/20/2022 1122   AST 19 03/26/2023 1055   ALT 14 03/26/2023 1055   ALKPHOS 91 03/26/2023 1055   BILITOT 0.6 03/26/2023 1055   BILITOT 0.7 12/20/2022 1122   BILIDIR 0.2 04/18/2021 0746   IBILI 0.8 04/18/2021 0746      Component Value Date/Time   TSH 2.83 05/07/2023 0900   Nutritional Lab Results  Component Value Date   VD25OH 67.4 11/28/2022   VD25OH 68.44 02/15/2022   VD25OH 56.7 11/26/2017    Attestations:   I, Special Puri, acting as a Stage manager for [name], DO., have compiled all relevant documentation for today's office visit on behalf of [name], DO, while in the presence of [employer], DO.  Reviewed by clinician on day of visit: allergies, medications, problem list, medical history, surgical history, family history, social history, and previous encounter notes pertinent to patient's obesity diagnosis. I have spent 42 minutes in the care of the patient today including: preparing to see patient (e.g. review and interpretation of tests, old notes ), obtaining and/or reviewing separately obtained history, performing a medically appropriate examination or evaluation, counseling and educating the patient, ordering medications, test or procedures, documenting clinical information in the electronic or other  health care record, and independently interpreting results and communicating results to the patient, family, or caregiver   I have reviewed the above documentation for accuracy and completeness, and I agree with the above. Frances Hoffman, D.O.  The 21st Century Cures Act was signed into law in 2016 which includes the topic of electronic health records.  This provides immediate access to information in MyChart.  This includes consultation notes, operative notes, office notes, lab results and pathology reports.  If you have any questions about what  you read please let us know at your next visit so we can discuss your concerns and take corrective action if need be.  We are right here with you.

## 2023-06-05 ENCOUNTER — Other Ambulatory Visit: Payer: Self-pay | Admitting: Family

## 2023-06-05 NOTE — Telephone Encounter (Signed)
 Florinef is managed by Dr Elease Hashimoto, cardiology for her orthostatic hypotension. Cancelled order and closing encounter.

## 2023-06-05 NOTE — Telephone Encounter (Signed)
 Copied from CRM [id number]. Topic: Clinical - Medication Refill >> Jun 05, 2023 10:04 AM Randa Ngo wrote: Most Recent Primary Care Visit:  Provider: Kerby Nora E  Department: [facility]  Visit Type: ACUTE  Date: 05/31/2023  Medication: fludrocortisone (FLORINEF) 0.1 MG tablet  Has the patient contacted their pharmacy? No (Agent: If no, request that the patient contact the pharmacy for the refill. If patient does not wish to contact the pharmacy document the reason why and proceed with request.) (Agent: If yes, when and what did the pharmacy advise?)  Is this the correct pharmacy for this prescription? Yes If no, delete pharmacy and type the correct one.  This is the patient's preferred pharmacy:  [facility] [address] [place], [place] - [address] [place] [address] Phone: [phone] Fax: [phone]   Has the prescription been filled recently? No  Is the patient out of the medication? Yes  Has the patient been seen for an appointment in the last year OR does the patient have an upcoming appointment? Yes  Can we respond through MyChart? Yes  Agent: Please be advised that Rx refills may take up to 3 business days. We ask that you follow-up with your pharmacy.

## 2023-06-06 ENCOUNTER — Ambulatory Visit (INDEPENDENT_AMBULATORY_CARE_PROVIDER_SITE_OTHER): Admitting: Family Medicine

## 2023-06-06 ENCOUNTER — Encounter: Payer: Self-pay | Admitting: Family Medicine

## 2023-06-06 ENCOUNTER — Ambulatory Visit: Payer: Self-pay

## 2023-06-06 VITALS — BP 118/76 | HR 82 | Temp 98.1°F | Ht 64.0 in | Wt 171.2 lb

## 2023-06-06 DIAGNOSIS — J0100 Acute maxillary sinusitis, unspecified: Secondary | ICD-10-CM | POA: Insufficient documentation

## 2023-06-06 MED ORDER — PREDNISONE 10 MG PO TABS: ORAL_TABLET | ORAL | 0 refills | Status: DC

## 2023-06-06 MED ORDER — AMOXICILLIN 500 MG PO CAPS: 1000.0000 mg | ORAL_CAPSULE | Freq: Two times a day (BID) | ORAL | 0 refills | Status: DC

## 2023-06-06 NOTE — Progress Notes (Signed)
 Patient ID: Frances Hoffman, female    DOB: 12/04/1940, 83 y.o.   MRN: [id number]  This visit was conducted in person.  BP 118/76   Pulse 82   Temp 98.1 F (36.7 C) (Oral)   Ht 5\' 4"  (1.626 m)   Wt 171 lb 4 oz (77.7 kg)   SpO2 95%   BMI 29.39 kg/m    CC:  Chief Complaint  Patient presents with   Sinus Problem    C/o worsening sinus pain. Seen on 05/31/23 for similar sxs.     Subjective:   HPI: Frances Hoffman is a 83 y.o. female presenting on 06/06/2023 for Sinus Problem (C/o worsening sinus pain. Seen on 05/31/23 for similar sxs. )  Seen for sinus pressure on 3/28.[name] dx with allergic sinusitis.[name] treated with flonase, antihistamine Neg COVID testing.   Date of onset:  8 days Initial symptoms included  nasal congestion Symptoms progressed to eye pain, headache.. increasing pain right maxillary sinus.  Nausea.  Mild cough, no SOB.  No wheeze.  Pain in  right ear.  Sweating  No fever.    Sick contacts:  none COVID testing:   negative.     Frances Hoffman has tried to treat with  Flonase , allergy meds.     No history of chronic lung disease such as asthma or COPD.  Prediabetes. Non-smoker.       Relevant past medical, surgical, family and social history reviewed and updated as indicated. Interim medical history since our last visit reviewed. Allergies and medications reviewed and updated. Outpatient Medications Prior to Visit  Medication Sig Dispense Refill   acetaminophen (TYLENOL) 500 MG tablet Take 500 mg by mouth every 6 (six) hours as needed.     aspirin EC 81 MG tablet Take 81 mg by mouth daily. Swallow whole.     aspirin-sod bicarb-citric acid (ALKA-SELTZER) 325 MG TBEF tablet Take 325 mg by mouth every 6 (six) hours as needed.     atorvastatin (LIPITOR) 80 MG tablet Take 1 tablet (80 mg total) by mouth daily. 90 tablet 3   calcium carbonate (OS-CAL - DOSED IN MG OF ELEMENTAL CALCIUM) 1250 (500 CA) MG tablet Take 1 tablet by mouth 2 (two) times daily with a meal.      carboxymethylcellulose (REFRESH PLUS) 0.5 % SOLN Place 1 drop into both eyes 3 (three) times daily as needed (for dryness).     Cholecalciferol (VITAMIN D3) 2000 UNITS capsule Take 2,000 Units by mouth daily.     Cinnamon 500 MG TABS Take 500 mg by mouth daily.     COLESTID 1 g tablet Take 2 g by mouth 2 (two) times daily. For 30 days     escitalopram (LEXAPRO) 20 MG tablet Take 1 tablet (20 mg total) by mouth daily. 90 tablet 4   fludrocortisone (FLORINEF) 0.1 MG tablet Take 1 tablet (0.1 mg total) by mouth daily. 30 tablet 2   levothyroxine (SYNTHROID) 88 MCG tablet Take 1 tablet (88 mcg total) by mouth daily. 90 tablet 3   loperamide (IMODIUM A-D) 2 MG capsule Take 2 mg by mouth daily as needed.     meclizine (ANTIVERT) 50 MG tablet Take 1 tablet (50 mg total) by mouth 3 (three) times daily as needed. 30 tablet 0   memantine (NAMENDA) 10 MG tablet Take 1 tablet (10 mg total) by mouth 2 (two) times daily. 180 tablet 3   metFORMIN (GLUCOPHAGE) 500 MG tablet Take 1 tablet (500 mg total) by mouth  2 (two) times daily with a meal. 180 tablet 0   omeprazole (PRILOSEC) 20 MG capsule Take 20 mg by mouth daily.     silver sulfADIAZINE (SILVADENE) 1 % cream Apply 1 Application topically daily. 50 g 0   traZODone (DESYREL) 50 MG tablet Take 1/2 tablet po at bedtime 30 tablet 3   No facility-administered medications prior to visit.     Per HPI unless specifically indicated in ROS section below Review of Systems  Constitutional:  Negative for fatigue and fever.  HENT:  Positive for congestion, ear pain, sinus pressure and sinus pain.   Eyes:  Negative for pain.  Respiratory:  Negative for cough and shortness of breath.   Cardiovascular:  Negative for chest pain, palpitations and leg swelling.  Gastrointestinal:  Negative for abdominal pain.  Genitourinary:  Negative for dysuria and vaginal bleeding.  Musculoskeletal:  Negative for back pain.  Neurological:  Negative for syncope, light-headedness  and headaches.  Psychiatric/Behavioral:  Negative for dysphoric mood.    Objective:  BP 118/76   Pulse 82   Temp 98.1 F (36.7 C) (Oral)   Ht 5\' 4"  (1.626 m)   Wt 171 lb 4 oz (77.7 kg)   SpO2 95%   BMI 29.39 kg/m   Wt Readings from Last 3 Encounters:  06/06/23 171 lb 4 oz (77.7 kg)  06/04/23 168 lb (76.2 kg)  05/31/23 171 lb 4 oz (77.7 kg)      Physical Exam Constitutional:      General: Frances Hoffman is not in acute distress.    Appearance: Normal appearance. Frances Hoffman is well-developed. Frances Hoffman is not ill-appearing or toxic-appearing.  HENT:     Head: Normocephalic.     Right Ear: Hearing, ear canal and external ear normal. A middle ear effusion is present. Tympanic membrane is not erythematous, retracted or bulging.     Left Ear: Hearing, tympanic membrane, ear canal and external ear normal. Tympanic membrane is not erythematous, retracted or bulging.     Nose: No mucosal edema or rhinorrhea.     Right Sinus: Maxillary sinus tenderness present. No frontal sinus tenderness.     Left Sinus: No maxillary sinus tenderness or frontal sinus tenderness.     Mouth/Throat:     Pharynx: Uvula midline.  Eyes:     General: Lids are normal. Lids are everted, no foreign bodies appreciated.     Conjunctiva/sclera: Conjunctivae normal.     Pupils: Pupils are equal, round, and reactive to light.  Neck:     Thyroid: No thyroid mass or thyromegaly.     Vascular: No carotid bruit.     Trachea: Trachea normal.  Cardiovascular:     Rate and Rhythm: Normal rate and regular rhythm.     Pulses: Normal pulses.     Heart sounds: Normal heart sounds, S1 normal and S2 normal. No murmur heard.    No friction rub. No gallop.  Pulmonary:     Effort: Pulmonary effort is normal. No tachypnea or respiratory distress.     Breath sounds: Normal breath sounds. No decreased breath sounds, wheezing, rhonchi or rales.  Abdominal:     General: Bowel sounds are normal.     Palpations: Abdomen is soft.     Tenderness: There  is no abdominal tenderness.  Musculoskeletal:     Cervical back: Normal range of motion and neck supple.  Skin:    General: Skin is warm and dry.     Findings: No rash.  Neurological:  Mental Status: Frances Hoffman is alert.  Psychiatric:        Mood and Affect: Mood is not anxious or depressed.        Speech: Speech normal.        Behavior: Behavior normal. Behavior is cooperative.        Thought Content: Thought content normal.        Judgment: Judgment normal.       Results for orders placed or performed in visit on 05/31/23  POC COVID-19   Collection Time: 05/31/23 11:54 AM  Result Value Ref Range   SARS Coronavirus 2 Ag Negative Negative    Assessment and Plan  Acute non-recurrent maxillary sinusitis Assessment & Plan: Acute, initial allergic sinusitis now with worsening unilateral face pain.  Evidence of eustachian tube dysfunction on the right. Will treat with prednisone taper 30/20/10 as well as treat with amoxicillin 500 mg 2 capsules twice daily x 10 days.  Continue antihistamine and start nasal saline irrigation.  Hold Flonase while on prednisone. If not improving as expected please call for further evaluation. If severe shortness of breath go to ER.   Other orders -     Amoxicillin; Take 2 capsules (1,000 mg total) by mouth 2 (two) times daily.  Dispense: 40 capsule; Refill: 0 -     predniSONE; 3 tabs by mouth daily x 3 days, then 2 tabs by mouth daily x 2 days then 1 tab by mouth daily x 2 days  Dispense: 15 tablet; Refill: 0    No follow-ups on file.   Kerby Nora, MD

## 2023-06-06 NOTE — Telephone Encounter (Signed)
 Noted thank you for seeing her Dr. Ermalene Searing.

## 2023-06-06 NOTE — Assessment & Plan Note (Signed)
 Acute, initial allergic sinusitis now with worsening unilateral face pain.  Evidence of eustachian tube dysfunction on the right. Will treat with prednisone taper 30/20/10 as well as treat with amoxicillin 500 mg 2 capsules twice daily x 10 days.  Continue antihistamine and start nasal saline irrigation.  Hold Flonase while on prednisone. If not improving as expected please call for further evaluation. If severe shortness of breath go to ER.

## 2023-06-06 NOTE — Telephone Encounter (Signed)
 Chief Complaint: Sinus pain Symptoms: sinus pain in face, eyes, nose, headache Frequency: 2 weeks Pertinent Negatives: Patient denies shortness of breath Disposition: [] ED /[] Urgent Care (no appt availability in office) / [x] Appointment(In office/virtual)/ []  [place] [facility]/ [] Home Care/ [] Refused Recommended Disposition /[] [place] [facility]/ []  Follow-up with PCP Additional Notes: Patient called in stating she was seen last week and is following up because she is not having improvement in her symptoms. Per provider notes from last visit, patient advised to return if OTC medication and regimen does not improve symptoms. Patient is still having 8/10 pain in face and head, along with a runny nose. Patient appt made for today for further evaluation.    Copied from CRM [id number]. Topic: Clinical - Red Word Triage >> Jun 06, 2023  9:30 AM Gurney Maxin H wrote: [facility] that prompted transfer to Nurse Triage: Eyes, head hurting 7-8, blood pressure 103/75 87. Reason for Disposition  [1] SEVERE pain AND [2] not improved 2 hours after pain medicine  Answer Assessment - Initial Assessment Questions 1. LOCATION: "Where does it hurt?"      Face, behind eyes, over nose, head 2. ONSET: "When did the sinus pain start?"  (e.g., hours, days)      Roughly 2 weeks now 3. SEVERITY: "How bad is the pain?"   (Scale 1-10; mild, moderate or severe)   - MILD (1-3): doesn't interfere with normal activities    - MODERATE (4-7): interferes with normal activities (e.g., work or school) or awakens from sleep   - SEVERE (8-10): excruciating pain and patient unable to do any normal activities        8-9 4. RECURRENT SYMPTOM: "Have you ever had sinus problems before?" If Yes, ask: "When was the last time?" and "What happened that time?"      Yes 5. NASAL CONGESTION: "Is the nose blocked?" If Yes, ask: "Can you open it or must you breathe through your mouth?"     No 6. NASAL DISCHARGE: "Do you have discharge from  your nose?" If so ask, "What color?"     Yes - clear discharge 7. FEVER: "Do you have a fever?" If Yes, ask: "What is it, how was it measured, and when did it start?"      No 8. OTHER SYMPTOMS: "Do you have any other symptoms?" (e.g., sore throat, cough, earache, difficulty breathing)     Nausea, dizziness, right ear discomfort  Protocols used: Sinus Pain or Congestion-A-AH

## 2023-06-06 NOTE — Patient Instructions (Signed)
 Complete antibiotics. Complete prednisone taper.  Hold Flonase while on but then restart 2 sprays per nostril daily when completed. Continue antihistamine.  Call if not improving as expected after completion of antibiotics, sooner if fever on antibiotics.  Go to the emergency room if severe shortness of breath.

## 2023-06-07 ENCOUNTER — Ambulatory Visit: Payer: Self-pay

## 2023-06-07 NOTE — Telephone Encounter (Signed)
  Chief Complaint: ear wax buildup Symptoms: discomfort   Disposition: [] ED /[] Urgent Care (no appt availability in office) / [x] Appointment(In office/virtual)/ []  [facility]/ [] Home Care/ [] Refused Recommended Disposition /[] [place] [facility]/ []  Follow-up with PCP Additional Notes: Pt complaining of right ear wax buildup. Pt denies pain but says it is uncomfortable. Pt was seen 4/2 by Dr Ermalene Searing and per pt"she told me I had ear wax buildup but she didn't bother to get it out and now I have to make another visit and pay.I'm not happy." RN advised pt to no try and dig/remove herself for risk of hurting eardrum. Pt verbalized understanding and wanted to make appt with PCP. Pt has apppt Monday 4/7 @ 10am. RN gave care advice and pt verbalized understanding.            Copied from CRM [id number]. Topic: Clinical - Medical Advice >> Jun 07, 2023  3:58 PM Cammy Copa D wrote: Reason for CRM: PT was seen for allergies recently but has complaints about a wax build-up in her ears that is not comfortable. PT wants advice on how to remove wax from ear/what can be done to help this. Reason for Disposition  [1] Earwax problem AND [2] no improvement using Care Advice  Answer Assessment - Initial Assessment Questions 1. LOCATION: "Which ear is involved?"      Right wax 2. SYMPTOMS: "What are the main symptoms?" (e.g., fullness, decreased hearing, itching, discomfort)     fullness 3. ONSET: "When did the   start?"     On and off for a few years  4. PAIN: "Is there any earache?" "How bad is it?"  (Scale 1-10; or mild, moderate, severe)     Uncomfortable  5. OBJECTS: "Do you use cotton swabs (Q-tips) in your ear?" "Have you put anything else in your ear?"     No  6. EARWAX HISTORY: "Have you had problems with earwax before?" If Yes, ask: "What did you do the last time?"     Nothing  Protocols used: Earwax-A-AH

## 2023-06-10 ENCOUNTER — Encounter: Payer: Self-pay | Admitting: Family

## 2023-06-10 ENCOUNTER — Ambulatory Visit (INDEPENDENT_AMBULATORY_CARE_PROVIDER_SITE_OTHER): Admitting: Family

## 2023-06-10 VITALS — BP 98/60 | HR 69 | Temp 98.5°F | Ht 64.0 in | Wt 178.0 lb

## 2023-06-10 DIAGNOSIS — R432 Parageusia: Secondary | ICD-10-CM | POA: Diagnosis not present

## 2023-06-10 DIAGNOSIS — H9201 Otalgia, right ear: Secondary | ICD-10-CM | POA: Diagnosis not present

## 2023-06-10 DIAGNOSIS — K146 Glossodynia: Secondary | ICD-10-CM

## 2023-06-10 DIAGNOSIS — J0100 Acute maxillary sinusitis, unspecified: Secondary | ICD-10-CM

## 2023-06-10 DIAGNOSIS — K219 Gastro-esophageal reflux disease without esophagitis: Secondary | ICD-10-CM | POA: Diagnosis not present

## 2023-06-10 DIAGNOSIS — R682 Dry mouth, unspecified: Secondary | ICD-10-CM | POA: Insufficient documentation

## 2023-06-10 MED ORDER — PANTOPRAZOLE SODIUM 20 MG PO TBEC: 20.0000 mg | DELAYED_RELEASE_TABLET | Freq: Every day | ORAL | 0 refills | Status: DC

## 2023-06-10 NOTE — Progress Notes (Signed)
 Established Patient Office Visit  Subjective:   Patient ID: Frances Hoffman, female    DOB: 02/24/1941  Age: 83 y.o. MRN: [id number]  CC:  Chief Complaint  Patient presents with   Acute Visit    States that she need to have ear wax removed from her ears.    HPI: Frances Hoffman is a 83 y.o. female presenting on 06/10/2023 for Acute Visit (States that she need to have ear wax removed from her ears.)   Was seeing office with Dr. Ermalene Searing 4/3 for sinusitis. Was given rx for prednisone and amox 500 mg twice daily x 10 days. She is feeling slightly improved, however she still feels a bit dizzy on and off and still with ear fullness and pain in her right ear. The pain is constant. Hearing does not seem to be affected. She does state sinus pressure has improved slightly.   Still with ongoing stinging of the tongue and is constant, interferes with her eating because food doesn't taste well. She often has a dry mouth and is often licking her licks. She sees dentist regularly. If she could describe it she states it feels as though she has scraped everything off of her tongue and it is just that constant burning sensation. She does daily states she has heartburn/acid reflux. She is on omeprazole.       ROS: Negative unless specifically indicated above in HPI.   Relevant past medical history reviewed and updated as indicated.   Allergies and medications reviewed and updated.   Current Outpatient Medications:    acetaminophen (TYLENOL) 500 MG tablet, Take 500 mg by mouth every 6 (six) hours as needed., Disp: , Rfl:    amoxicillin (AMOXIL) 500 MG capsule, Take 2 capsules (1,000 mg total) by mouth 2 (two) times daily., Disp: 40 capsule, Rfl: 0   aspirin EC 81 MG tablet, Take 81 mg by mouth daily. Swallow whole., Disp: , Rfl:    aspirin-sod bicarb-citric acid (ALKA-SELTZER) 325 MG TBEF tablet, Take 325 mg by mouth every 6 (six) hours as needed., Disp: , Rfl:    atorvastatin (LIPITOR) 80 MG tablet,  Take 1 tablet (80 mg total) by mouth daily., Disp: 90 tablet, Rfl: 3   calcium carbonate (OS-CAL - DOSED IN MG OF ELEMENTAL CALCIUM) 1250 (500 CA) MG tablet, Take 1 tablet by mouth 2 (two) times daily with a meal., Disp: , Rfl:    carboxymethylcellulose (REFRESH PLUS) 0.5 % SOLN, Place 1 drop into both eyes 3 (three) times daily as needed (for dryness)., Disp: , Rfl:    Cholecalciferol (VITAMIN D3) 2000 UNITS capsule, Take 2,000 Units by mouth daily., Disp: , Rfl:    Cinnamon 500 MG TABS, Take 500 mg by mouth daily., Disp: , Rfl:    COLESTID 1 g tablet, Take 2 g by mouth 2 (two) times daily. For 30 days, Disp: , Rfl:    escitalopram (LEXAPRO) 20 MG tablet, Take 1 tablet (20 mg total) by mouth daily., Disp: 90 tablet, Rfl: 4   fludrocortisone (FLORINEF) 0.1 MG tablet, Take 1 tablet (0.1 mg total) by mouth daily., Disp: 30 tablet, Rfl: 2   levothyroxine (SYNTHROID) 88 MCG tablet, Take 1 tablet (88 mcg total) by mouth daily., Disp: 90 tablet, Rfl: 3   loperamide (IMODIUM A-D) 2 MG capsule, Take 2 mg by mouth daily as needed., Disp: , Rfl:    meclizine (ANTIVERT) 50 MG tablet, Take 1 tablet (50 mg total) by mouth 3 (three) times daily as needed.,  Disp: 30 tablet, Rfl: 0   memantine (NAMENDA) 10 MG tablet, Take 1 tablet (10 mg total) by mouth 2 (two) times daily., Disp: 180 tablet, Rfl: 3   metFORMIN (GLUCOPHAGE) 500 MG tablet, Take 1 tablet (500 mg total) by mouth 2 (two) times daily with a meal., Disp: 180 tablet, Rfl: 0   pantoprazole (PROTONIX) 20 MG tablet, Take 1 tablet (20 mg total) by mouth daily., Disp: 30 tablet, Rfl: 0   predniSONE (DELTASONE) 10 MG tablet, 3 tabs by mouth daily x 3 days, then 2 tabs by mouth daily x 2 days then 1 tab by mouth daily x 2 days, Disp: 15 tablet, Rfl: 0   silver sulfADIAZINE (SILVADENE) 1 % cream, Apply 1 Application topically daily., Disp: 50 g, Rfl: 0   traZODone (DESYREL) 50 MG tablet, Take 1/2 tablet po at bedtime, Disp: 30 tablet, Rfl: 3  Allergies   Allergen Reactions   Ciprofloxacin Other (See Comments)    Unknown (vertigo??)   Citalopram Hydrobromide Other (See Comments)    Intrusive/ odd thoughts    Flagyl [Metronidazole] Other (See Comments)    Unknown??   Milk-Related Compounds    Oxycodone Other (See Comments)    Headaches    Objective:   BP 98/60 (BP Location: Left Arm, Patient Position: Sitting, Cuff Size: Normal)   Pulse 69   Temp 98.5 F (36.9 C) (Temporal)   Ht 5\' 4"  (1.626 m)   Wt 178 lb (80.7 kg)   SpO2 96%   BMI 30.55 kg/m    Physical Exam Constitutional:      General: She is not in acute distress.    Appearance: Normal appearance. She is normal weight. She is not ill-appearing, toxic-appearing or diaphoretic.  HENT:     Head: Normocephalic.     Right Ear: A middle ear effusion is present.     Left Ear: Tympanic membrane normal.     Nose: Nose normal.     Mouth/Throat:     Mouth: Mucous membranes are dry.     Dentition: Normal dentition.     Pharynx: Posterior oropharyngeal erythema and postnasal drip present. No oropharyngeal exudate.  Eyes:     Extraocular Movements: Extraocular movements intact.     Pupils: Pupils are equal, round, and reactive to light.  Cardiovascular:     Rate and Rhythm: Normal rate and regular rhythm.     Pulses: Normal pulses.     Heart sounds: Normal heart sounds.  Pulmonary:     Effort: Pulmonary effort is normal.     Breath sounds: Normal breath sounds.  Musculoskeletal:     Cervical back: Normal range of motion.  Neurological:     General: No focal deficit present.     Mental Status: She is alert and oriented to person, place, and time. Mental status is at baseline.  Psychiatric:        Mood and Affect: Mood normal.        Behavior: Behavior normal.        Thought Content: Thought content normal.        Judgment: Judgment normal.     Assessment & Plan:  Acute non-recurrent maxillary sinusitis Assessment & Plan: Cont amox and also prednisone Advised  correct dosage as only taking 1 tablet twice daily supposed to take two tablets twice daily (1000 mg twice daily)    Otalgia of right ear  Dry mouth  Burning mouth syndrome Assessment & Plan: Advised to try biotene for dry mouth  Will  stop omeprazole start pantoprazole  Suspect gerd contributing.  Also advised pt to fu with ENT for ongoing right ear otalgia and burning mouth with taste changes  Orders: -     Pantoprazole Sodium; Take 1 tablet (20 mg total) by mouth daily.  Dispense: 30 tablet; Refill: 0  Chronic GERD -     Pantoprazole Sodium; Take 1 tablet (20 mg total) by mouth daily.  Dispense: 30 tablet; Refill: 0  Abnormal sense of taste     Follow up plan: Return if symptoms worsen or fail to improve.  [name], FNP

## 2023-06-10 NOTE — Assessment & Plan Note (Signed)
 Cont amox and also prednisone Advised correct dosage as only taking 1 tablet twice daily supposed to take two tablets twice daily (1000 mg twice daily)

## 2023-06-10 NOTE — Patient Instructions (Addendum)
  Start calcium with vitamin D daily. This is for your bone health.   ------------------------------------  Call ENT about your ear and your burning mouth and taste changes.   Dione Housekeeper, MD (Attending) NPI: [id number] [phone] (Work) [phone] (Fax) [address] [place], [place] [address] Otolaryngology   ------------------------------------  For your amoxicillin take two tablets twice daily  Each tablet should be 500 mg  So goal is 1000 mg in am and 1000 mg at night time.    ------------------------------------  Stop omeprazole Start pantoprazole for your heart burn.

## 2023-06-10 NOTE — Assessment & Plan Note (Signed)
 Advised to try biotene for dry mouth  Will stop omeprazole start pantoprazole  Suspect gerd contributing.  Also advised pt to fu with ENT for ongoing right ear otalgia and burning mouth with taste changes

## 2023-06-12 ENCOUNTER — Other Ambulatory Visit: Payer: Self-pay | Admitting: Cardiovascular Disease

## 2023-06-19 ENCOUNTER — Telehealth: Payer: Self-pay

## 2023-06-19 NOTE — Telephone Encounter (Signed)
 Is she taking 1/2 tablet or full tablet?  50 mg is lowest dose if she is taking full tablet of 50 mg we can increase to 100 mg before coming up with a change in medication.   Let me know.

## 2023-06-19 NOTE — Telephone Encounter (Signed)
 Copied from CRM [id number]. Topic: Clinical - Medication Question >> Jun 18, 2023  3:59 PM Frances Hoffman wrote: Reason for CRM: Patient called stating that she is currently taking trazodone, but it only works for a few hours. After that, she is awake for the remainder of the night. She is requesting if her provider can prescribe an alternative medication instead. Callback number [phone]

## 2023-06-19 NOTE — Telephone Encounter (Signed)
 Phone call to pt.  She is currently taking 1/2 tablet.  Instructed her to increase to 1 tablet and let us  know if it does not help.  Pt verbalized understanding.

## 2023-06-20 ENCOUNTER — Ambulatory Visit: Payer: Self-pay

## 2023-06-20 NOTE — Telephone Encounter (Signed)
 Chief Complaint: Headaches Symptoms: sinus pain Frequency: about 1 week Pertinent Negatives: Patient denies cough, SOB Disposition: [] ED /[x] Urgent Care (no appt availability in office) / [] Appointment(In office/virtual)/ []  [facility]/ [] Home Care/ [] Refused Recommended Disposition /[] [place] [facility]/ []  Follow-up with PCP Additional Notes: Pt reports her symptoms from OV 04/03 are the same. She notes her symptoms have not improved since beginning the abx course. No OV available. Pt advised UC if she was unable to wait for follow up OV. This RN educated pt on home care, new-worsening symptoms, when to call back/seek emergent care. Pt verbalized understanding and agrees to plan.    Copied from CRM [id number]. Topic: Clinical - Prescription Issue >> Jun 20, 2023  1:46 PM Marlan Silva wrote: Reason for CRM: Patient called in stating that FNP Felicita Horns prescribed her Ambien but she is still having really bad headaches. Patient states that she does not know what she should do. Patient is requesting a call back or to speak with Rachael Budd nurse. Reason for Disposition  [1] Taking antibiotic > 72 hours (3 days) AND [2] sinus pain not improved  Answer Assessment - Initial Assessment Questions 1. ANTIBIOTIC: "What antibiotic are you taking?" "How many times a day?"     Amoxicillin 2. ONSET: "When was the antibiotic started?"     04/03 3. PAIN: "How bad is the sinus pain?"   (Scale 1-10; mild, moderate or severe)   - MILD (1-3): doesn't interfere with normal activities    - MODERATE (4-7): interferes with normal activities (e.g., work or school) or awakens from sleep   - SEVERE (8-10): excruciating pain and patient unable to do any normal activities        8/10 4. FEVER: "Do you have a fever?" If Yes, ask: "What is it, how was it measured, and when did it start?"      Pt reports she may have had one yesterday 5. SYMPTOMS: "Are there any other symptoms you're concerned about?"  If Yes, ask: "When did it start?"     Runny nose  Protocols used: Sinus Infection on Antibiotic Follow-up Call-A-AH

## 2023-06-24 NOTE — Telephone Encounter (Signed)
 Due to the length of time since evaluated I'd like to get her back in the office.

## 2023-06-24 NOTE — Telephone Encounter (Signed)
 Patient stated that she is better now.

## 2023-06-24 NOTE — Telephone Encounter (Signed)
 NOTED

## 2023-06-28 ENCOUNTER — Ambulatory Visit: Payer: Self-pay

## 2023-06-28 NOTE — Telephone Encounter (Signed)
 Copied from CRM [id number]. Topic: Clinical - Red Word Triage >> Jun 28, 2023  3:24 PM Frances Hoffman wrote: Red Word that prompted transfer to Nurse Triage: Stated antibiotics that was for her allergies isn't or didn't work still has No energy, no taste.. Would like to be triaged by a nurse for help.   Chief Complaint: Sinus congestion  Symptoms: Sinus congestion, sinus pain, fatigue, some mild dizziness Frequency: Intermittent  Pertinent Negatives: Patient denies fevers Disposition: [] ED /[] Urgent Care (no appt availability in office) / [x] Appointment(In office/virtual)/ []  [place] [facility]/ [] Home Care/ [] Refused Recommended Disposition /[] [place] [facility]/ []  Follow-up with PCP Additional Notes: Patient reports she was seen recently for sinus congestion and pain and was treated with an antibiotic. She states that she has finished the antibiotics but is still experiencing symptoms and would like to be re-evaluated. The patient states her symptoms have no worsened since she was last seen. Appointment made for the patient on Tuesday for evaluation.     Reason for Disposition  [1] Sinus congestion (pressure, fullness) AND [2] present > 10 days  Answer Assessment - Initial Assessment Questions 1. LOCATION: "Where does it hurt?"      [place]  2. ONSET: "When did the sinus pain start?"  (e.g., hours, days)      A few weeks ago, has been seen for the same  3. SEVERITY: "How bad is the pain?"   (Scale 1-10; mild, moderate or severe)   - MILD (1-3): doesn't interfere with normal activities    - MODERATE (4-7): interferes with normal activities (e.g., work or school) or awakens from sleep   - SEVERE (8-10): excruciating pain and patient unable to do any normal activities        Mild  4. RECURRENT SYMPTOM: "Have you ever had sinus problems before?" If Yes, ask: "When was the last time?" and "What happened that time?"      Yes, recently treated for the same  5. NASAL CONGESTION: "Is the nose  blocked?" If Yes, ask: "Can you open it or must you breathe through your mouth?"     No 6. NASAL DISCHARGE: "Do you have discharge from your nose?" If so ask, "What color?"     Clear discharge  7. FEVER: "Do you have a fever?" If Yes, ask: "What is it, how was it measured, and when did it start?"      No 8. OTHER SYMPTOMS: "Do you have any other symptoms?" (e.g., sore throat, cough, earache, difficulty breathing)     Dizziness, fatigue  Protocols used: Sinus Pain or Congestion-A-AH

## 2023-07-01 NOTE — Telephone Encounter (Signed)
 NOTED

## 2023-07-02 ENCOUNTER — Ambulatory Visit (INDEPENDENT_AMBULATORY_CARE_PROVIDER_SITE_OTHER): Admitting: Family

## 2023-07-02 ENCOUNTER — Encounter: Payer: Self-pay | Admitting: Family

## 2023-07-02 ENCOUNTER — Telehealth: Payer: Self-pay | Admitting: Family

## 2023-07-02 VITALS — BP 92/60 | HR 60 | Temp 97.8°F | Ht 64.0 in | Wt 166.8 lb

## 2023-07-02 DIAGNOSIS — K146 Glossodynia: Secondary | ICD-10-CM | POA: Diagnosis not present

## 2023-07-02 DIAGNOSIS — H9201 Otalgia, right ear: Secondary | ICD-10-CM | POA: Diagnosis not present

## 2023-07-02 DIAGNOSIS — R682 Dry mouth, unspecified: Secondary | ICD-10-CM

## 2023-07-02 DIAGNOSIS — K117 Disturbances of salivary secretion: Secondary | ICD-10-CM | POA: Diagnosis not present

## 2023-07-02 DIAGNOSIS — H6991 Unspecified Eustachian tube disorder, right ear: Secondary | ICD-10-CM | POA: Diagnosis not present

## 2023-07-02 DIAGNOSIS — F331 Major depressive disorder, recurrent, moderate: Secondary | ICD-10-CM

## 2023-07-02 DIAGNOSIS — R432 Parageusia: Secondary | ICD-10-CM | POA: Diagnosis not present

## 2023-07-02 NOTE — Patient Instructions (Signed)
  Nasal saline spray  And start a night zyrtec

## 2023-07-02 NOTE — Assessment & Plan Note (Signed)
 Symptomatic ongoing Referral to ent as well.

## 2023-07-02 NOTE — Progress Notes (Signed)
 Established Patient Office Visit  Subjective:   Patient ID: Frances Hoffman, female    DOB: 24-Feb-1941  Age: 83 y.o. MRN: [id number]  CC:  Chief Complaint  Patient presents with   Acute Visit    Reports increased fatigue and no appetite.    HPI: Frances Hoffman is a 83 y.o. female presenting on 07/02/2023 for Acute Visit (Reports increased fatigue and no appetite.)  4/3 treated with prednisone  and amoxicillin  for sinusitis.  Came in back in office with me 06/10/23 with additional complaints of burning mouth pain and abn sense of taste and smell. She was advised to stop omeprazole  and start pantoprazole  of which she states did not provide ay improvement. Still with ongoing fatigue throughout the day and no desire to eat much because of the altered taste sensation.she does have some symptoms of depression, will nap often, and has no energy or desire at times to go out and do the things she used to like to do.   She does have zyrtec but not taking regularly. She does have nasal saline at home but doesn't always use.   She has seen ENT in the past, last in 2023 for sx of liquid behind her ear of which these symptoms are still present but quick workup was unremarkable.        Wt Readings from Last 3 Encounters:  07/02/23 166 lb 12.8 oz (75.7 kg)  06/10/23 178 lb (80.7 kg)  06/06/23 171 lb 4 oz (77.7 kg)          ROS: Negative unless specifically indicated above in HPI.   Relevant past medical history reviewed and updated as indicated.   Allergies and medications reviewed and updated.   Current Outpatient Medications:    acetaminophen  (TYLENOL ) 500 MG tablet, Take 500 mg by mouth every 6 (six) hours as needed., Disp: , Rfl:    aspirin  EC 81 MG tablet, Take 81 mg by mouth daily. Swallow whole., Disp: , Rfl:    aspirin -sod bicarb-citric acid (ALKA-SELTZER) 325 MG TBEF tablet, Take 325 mg by mouth every 6 (six) hours as needed., Disp: , Rfl:    atorvastatin  (LIPITOR) 80 MG  tablet, Take 1 tablet (80 mg total) by mouth daily., Disp: 90 tablet, Rfl: 3   calcium  carbonate (OS-CAL - DOSED IN MG OF ELEMENTAL CALCIUM ) 1250 (500 CA) MG tablet, Take 1 tablet by mouth 2 (two) times daily with a meal., Disp: , Rfl:    carboxymethylcellulose (REFRESH PLUS) 0.5 % SOLN, Place 1 drop into both eyes 3 (three) times daily as needed (for dryness)., Disp: , Rfl:    Cholecalciferol  (VITAMIN D3) 2000 UNITS capsule, Take 2,000 Units by mouth daily., Disp: , Rfl:    COLESTID 1 g tablet, Take 2 g by mouth 2 (two) times daily. For 30 days, Disp: , Rfl:    escitalopram  (LEXAPRO ) 20 MG tablet, Take 1 tablet (20 mg total) by mouth daily., Disp: 90 tablet, Rfl: 4   fludrocortisone  (FLORINEF ) 0.1 MG tablet, Take 1 tablet by mouth once daily, Disp: 30 tablet, Rfl: 10   levothyroxine  (SYNTHROID ) 88 MCG tablet, Take 1 tablet (88 mcg total) by mouth daily., Disp: 90 tablet, Rfl: 3   loperamide (IMODIUM A-D) 2 MG capsule, Take 2 mg by mouth daily as needed., Disp: , Rfl:    meclizine  (ANTIVERT ) 50 MG tablet, Take 1 tablet (50 mg total) by mouth 3 (three) times daily as needed., Disp: 30 tablet, Rfl: 0   memantine  (NAMENDA ) 10 MG  tablet, Take 1 tablet (10 mg total) by mouth 2 (two) times daily., Disp: 180 tablet, Rfl: 3   metFORMIN  (GLUCOPHAGE ) 500 MG tablet, Take 1 tablet (500 mg total) by mouth 2 (two) times daily with a meal., Disp: 180 tablet, Rfl: 0   pantoprazole  (PROTONIX ) 20 MG tablet, Take 1 tablet (20 mg total) by mouth daily., Disp: 30 tablet, Rfl: 0   silver  sulfADIAZINE  (SILVADENE ) 1 % cream, Apply 1 Application topically daily., Disp: 50 g, Rfl: 0   traZODone  (DESYREL ) 50 MG tablet, Take 1/2 tablet po at bedtime, Disp: 30 tablet, Rfl: 3  Allergies  Allergen Reactions   Ciprofloxacin  Other (See Comments)    Unknown (vertigo??)   Citalopram  Hydrobromide Other (See Comments)    Intrusive/ odd thoughts    Flagyl  [Metronidazole ] Other (See Comments)    Unknown??   Milk-Related Compounds     Oxycodone  Other (See Comments)    Headaches    Objective:   BP 92/60 (BP Location: Right Arm, Patient Position: Sitting, Cuff Size: Normal)   Pulse 60   Temp 97.8 F (36.6 C) (Temporal)   Ht 5\' 4"  (1.626 m)   Wt 166 lb 12.8 oz (75.7 kg)   SpO2 98%   BMI 28.63 kg/m    Physical Exam Constitutional:      General: She is not in acute distress.    Appearance: Normal appearance. She is normal weight. She is not ill-appearing, toxic-appearing or diaphoretic.  HENT:     Head: Normocephalic.     Jaw: No tenderness, swelling or pain on movement.     Right Ear: A middle ear effusion is present.     Left Ear: Tympanic membrane normal.     Nose: Nose normal.     Mouth/Throat:     Mouth: Mucous membranes are dry.     Pharynx: No oropharyngeal exudate or posterior oropharyngeal erythema.  Eyes:     Extraocular Movements: Extraocular movements intact.     Pupils: Pupils are equal, round, and reactive to light.  Cardiovascular:     Rate and Rhythm: Normal rate and regular rhythm.     Pulses: Normal pulses.     Heart sounds: Normal heart sounds.  Pulmonary:     Effort: Pulmonary effort is normal.     Breath sounds: Normal breath sounds.  Musculoskeletal:     Cervical back: Normal range of motion.  Neurological:     General: No focal deficit present.     Mental Status: She is alert and oriented to person, place, and time. Mental status is at baseline.  Psychiatric:        Mood and Affect: Mood normal.        Behavior: Behavior normal.        Thought Content: Thought content normal.        Judgment: Judgment normal.     Assessment & Plan:  Burning mouth syndrome Assessment & Plan: Pt also with xerostomia Has been advised to try biotene Also trial 2-3 days without trazodone  and see if this helps. If not restart.  B12 has been tested, no def was high pt no longer taking otc  Orders: -     Ambulatory referral to ENT  Eustachian tube dysfunction, right Assessment &  Plan: Symptomatic ongoing Referral to ent as well.    Orders: -     Ambulatory referral to ENT  Otalgia of right ear -     Ambulatory referral to ENT  Abnormal sense of taste Assessment &  Plan: Chronic Referral placed for ENT for eval/treat pt requesting second opinion from 2023 consult.  Suspect due to recurrent sinusitis and or ETD Pt advised to start daily nasal saline rinse and encouraged nightly zyrtec  Orders: -     Ambulatory referral to ENT  Dry mouth -     Ambulatory referral to ENT  Xerostomia -     Ambulatory referral to ENT  MDD (major depressive disorder), recurrent episode, moderate (HCC) Assessment & Plan: We will consider addition of remeron , will consult with pharmacy  Pt with worsening depressive symptoms   Orders: -     AMB Referral [facility] Care Management     Follow up plan: No follow-ups on file.  Felicita Horns, FNP

## 2023-07-02 NOTE — Assessment & Plan Note (Signed)
 We will consider addition of remeron , will consult with pharmacy  Pt with worsening depressive symptoms

## 2023-07-02 NOTE — Assessment & Plan Note (Signed)
 Pt also with xerostomia Has been advised to try biotene Also trial 2-3 days without trazodone  and see if this helps. If not restart.  B12 has been tested, no def was high pt no longer taking otc

## 2023-07-02 NOTE — Assessment & Plan Note (Signed)
 Chronic Referral placed for ENT for eval/treat pt requesting second opinion from 2023 consult.  Suspect due to recurrent sinusitis and or ETD Pt advised to start daily nasal saline rinse and encouraged nightly zyrtec

## 2023-07-03 NOTE — Telephone Encounter (Signed)
 disregard

## 2023-07-04 ENCOUNTER — Telehealth: Payer: Self-pay

## 2023-07-04 ENCOUNTER — Ambulatory Visit (INDEPENDENT_AMBULATORY_CARE_PROVIDER_SITE_OTHER): Admitting: Family Medicine

## 2023-07-04 NOTE — Progress Notes (Signed)
 Care Guide Pharmacy Note  07/04/2023 Name: Frances Hoffman MRN: [id number] DOB: Jul 18, 1940  Referred By: Felicita Horns, FNP Reason for referral: Complex Care Management (Outreach to schedule with Pharm d )   Frances Hoffman is a 83 y.o. year old female who is a primary care patient of Felicita Horns, FNP.  Frances Hoffman was referred to the pharmacist for assistance related to: Depression  Successful contact was made with the patient to discuss pharmacy services including being ready for the pharmacist to call at least 5 minutes before the scheduled appointment time and to have medication bottles and any blood pressure readings ready for review. The patient agreed to meet with the pharmacist via telephone visit on (date/time).07/09/2023  Lenton Rail , RMA     [place]  [facility], [facility] Guide  Direct Dial: [phone]  Website: [email or web address]

## 2023-07-05 ENCOUNTER — Other Ambulatory Visit: Payer: Self-pay | Admitting: Family

## 2023-07-05 DIAGNOSIS — K146 Glossodynia: Secondary | ICD-10-CM

## 2023-07-05 DIAGNOSIS — K219 Gastro-esophageal reflux disease without esophagitis: Secondary | ICD-10-CM

## 2023-07-09 ENCOUNTER — Other Ambulatory Visit

## 2023-07-09 NOTE — Progress Notes (Deleted)
 07/09/2023 Name: Frances Hoffman MRN: [id number] DOB: [birth year]  Subjective  No chief complaint on file.   Care Team: Primary Care Provider: Felicita Horns, FNP  Reason for visit: Frances Hoffman is a 83 y.o. year old female who presented for a telephone visit.   They were referred to the pharmacist by their PCP for assistance in managing  MDD . Pertinent PMH: CKD, insomnia w daytime sleepiness, vascular dementia, IBS, positional vertigo, cognitive impairment   Medication Access/Adherence: ?  Prescription drug coverage: Payor: [employer] MEDICARE / Plan: [employer] MEDICARE HMO/PPO / Product Type: *No Product type* / .   - Reports that all medications are affordable: {YES/NO:21197} - Medication packaging: {LZ med packaging:31508} - Patient manages their own medications/refills: {YES/NO:21197} - Uses Pillbox: {YES/NO:21197} - Missed medication doses: {LZ med adherence:31509}   Recent visit with PCP 4/29: Consideration of adding remeron  to current regimen for depressive symptoms. Pharmacy input requested.  HPI: Report Regimen:  Escitalopram  20 mg daily  Trazodone  25 mg at bedtime   Patient reports concern regarding worsening depressive symptoms.     Patient {HAS/DOES NOT WUJW:11914} their medications with then at the time of the visit.   Outpatient Encounter Medications as of 07/09/2023  Medication Sig   acetaminophen  (TYLENOL ) 500 MG tablet Take 500 mg by mouth every 6 (six) hours as needed.   aspirin  EC 81 MG tablet Take 81 mg by mouth daily. Swallow whole.   aspirin -sod bicarb-citric acid (ALKA-SELTZER) 325 MG TBEF tablet Take 325 mg by mouth every 6 (six) hours as needed.   atorvastatin  (LIPITOR) 80 MG tablet Take 1 tablet (80 mg total) by mouth daily.   calcium  carbonate (OS-CAL - DOSED IN MG OF ELEMENTAL CALCIUM ) 1250 (500 CA) MG tablet Take 1 tablet by mouth 2 (two) times daily with a meal.   carboxymethylcellulose (REFRESH PLUS) 0.5 % SOLN Place 1 drop into both eyes 3  (three) times daily as needed (for dryness).   Cholecalciferol  (VITAMIN D3) 2000 UNITS capsule Take 2,000 Units by mouth daily.   COLESTID 1 g tablet Take 2 g by mouth 2 (two) times daily. For 30 days   escitalopram  (LEXAPRO ) 20 MG tablet Take 1 tablet (20 mg total) by mouth daily.   fludrocortisone  (FLORINEF ) 0.1 MG tablet Take 1 tablet by mouth once daily   levothyroxine  (SYNTHROID ) 88 MCG tablet Take 1 tablet (88 mcg total) by mouth daily.   loperamide (IMODIUM A-D) 2 MG capsule Take 2 mg by mouth daily as needed.   meclizine  (ANTIVERT ) 50 MG tablet Take 1 tablet (50 mg total) by mouth 3 (three) times daily as needed.   memantine  (NAMENDA ) 10 MG tablet Take 1 tablet (10 mg total) by mouth 2 (two) times daily.   metFORMIN  (GLUCOPHAGE ) 500 MG tablet Take 1 tablet (500 mg total) by mouth 2 (two) times daily with a meal.   pantoprazole  (PROTONIX ) 20 MG tablet Take 1 tablet by mouth once daily   silver  sulfADIAZINE  (SILVADENE ) 1 % cream Apply 1 Application topically daily.   traZODone  (DESYREL ) 50 MG tablet Take 1/2 tablet po at bedtime   No facility-administered encounter medications on file as of 07/09/2023.     Assessment and Plan:   Medication Access All medications were reviewed with patient today and medication list was updated as appropriate.  Barriers identified / Adherence concerns: *** ***   Medications as of ***  ? Medication name Instructions  Notes   Diabetes***      ***     ***     ***     ***     ***     ***  Hypertension***     ***     ***     ***     ***     ***     Hyperlipidemia***     ***     ***     ***     ***     ***     ***     ***     ***     ***     ***     ***     ***     ***     ***     ***       Future Appointments  Date Time Provider Department Center  07/09/2023  1:00 PM [facility] PHARMACIST [facility]  08/19/2023 10:10 AM [facility] MM 2 [facility]  11/14/2023 10:30 AM [facility] DX DEXA 1 [facility]   01/28/2024 10:10 AM [facility] ANNUAL WELLNESS VISIT 2 [facility]    Daron Ellen, PharmD Clinical Pharmacist [facility] [phone]

## 2023-07-11 ENCOUNTER — Encounter (INDEPENDENT_AMBULATORY_CARE_PROVIDER_SITE_OTHER): Payer: Self-pay | Admitting: Otolaryngology

## 2023-07-11 ENCOUNTER — Encounter: Payer: Self-pay | Admitting: Pharmacist

## 2023-07-11 ENCOUNTER — Other Ambulatory Visit

## 2023-07-11 NOTE — Progress Notes (Signed)
 Attempted to contact patient's husband for scheduled appointment for medication management. Left HIPAA compliant message for patient to return my call at their convenience. Left direct callback number.

## 2023-07-11 NOTE — Progress Notes (Deleted)
 07/11/2023 Name: Frances Hoffman MRN: [id number] DOB: [birth year]  Subjective  No chief complaint on file.   Care Team: Primary Care Provider: Felicita Horns, FNP  Reason for visit: Frances Hoffman is a 83 y.o. year old female who presented for a telephone visit.   They were referred to the pharmacist by their PCP for assistance in managing MDD. Pertinent PMH: CKD, insomnia w daytime sleepiness, vascular dementia, IBS, positional vertigo, cognitive impairment   Medication Access/Adherence: ?  Prescription drug coverage: Payor: AET[employer] MEDICARE / Plan: AETNA MEDICARE HMO/PPO / Product Type: *No Product type* / .   - Reports that all medications are affordable: {YES/NO:21197} - Medication packaging: {LZ med packaging:31508} - Patient manages their own medications/refills: {YES/NO:21197} - Uses Pillbox: {YES/NO:21197} - Missed medication doses: {LZ med adherence:31509}   Recent visit with PCP 4/29: Consideration of adding remeron  to current regimen for depressive symptoms. Pharmacy input requested.  HPI: Report Regimen:  Escitalopram  20 mg daily  Trazodone  25 mg at bedtime   Patient reports concern regarding worsening depressive symptoms.  *** Trazodone  effective?     Patient {HAS/DOES NOT VHQI:69629} their medications with then at the time of the visit.   Outpatient Encounter Medications as of 07/11/2023  Medication Sig   acetaminophen  (TYLENOL ) 500 MG tablet Take 500 mg by mouth every 6 (six) hours as needed.   aspirin  EC 81 MG tablet Take 81 mg by mouth daily. Swallow whole.   aspirin -sod bicarb-citric acid (ALKA-SELTZER) 325 MG TBEF tablet Take 325 mg by mouth every 6 (six) hours as needed.   atorvastatin  (LIPITOR) 80 MG tablet Take 1 tablet (80 mg total) by mouth daily.   calcium  carbonate (OS-CAL - DOSED IN MG OF ELEMENTAL CALCIUM ) 1250 (500 CA) MG tablet Take 1 tablet by mouth 2 (two) times daily with a meal.   carboxymethylcellulose (REFRESH PLUS) 0.5 % SOLN Place 1  drop into both eyes 3 (three) times daily as needed (for dryness).   Cholecalciferol  (VITAMIN D3) 2000 UNITS capsule Take 2,000 Units by mouth daily.   COLESTID 1 g tablet Take 2 g by mouth 2 (two) times daily. For 30 days   escitalopram  (LEXAPRO ) 20 MG tablet Take 1 tablet (20 mg total) by mouth daily.   fludrocortisone  (FLORINEF ) 0.1 MG tablet Take 1 tablet by mouth once daily   levothyroxine  (SYNTHROID ) 88 MCG tablet Take 1 tablet (88 mcg total) by mouth daily.   loperamide (IMODIUM A-D) 2 MG capsule Take 2 mg by mouth daily as needed.   meclizine  (ANTIVERT ) 50 MG tablet Take 1 tablet (50 mg total) by mouth 3 (three) times daily as needed.   memantine  (NAMENDA ) 10 MG tablet Take 1 tablet (10 mg total) by mouth 2 (two) times daily.   metFORMIN  (GLUCOPHAGE ) 500 MG tablet Take 1 tablet (500 mg total) by mouth 2 (two) times daily with a meal.   pantoprazole  (PROTONIX ) 20 MG tablet Take 1 tablet by mouth once daily   silver  sulfADIAZINE  (SILVADENE ) 1 % cream Apply 1 Application topically daily.   traZODone  (DESYREL ) 50 MG tablet Take 1/2 tablet po at bedtime   No facility-administered encounter medications on file as of 07/11/2023.     Assessment and Plan:   Medication Access All medications were reviewed with patient today and medication list was updated as appropriate.  Barriers identified / Adherence concerns: *** ***   Medications as of ***  ? Medication name Instructions  Notes   Diabetes***      ***     ***     ***     ***     ***     ***  Hypertension***     ***     ***     ***     ***     ***     Hyperlipidemia***     ***     ***     ***     ***     ***     ***     ***     ***     ***     ***     ***     ***     ***     ***     ***       Future Appointments  Date Time Provider Department Center  07/11/2023  1:00 PM [facility] PHARMACIST [facility]  08/19/2023 10:10 AM [facility] MM 2 [facility]  11/14/2023 10:30 AM [facility] DX DEXA 1  [facility]  01/28/2024 10:10 AM [facility] ANNUAL WELLNESS VISIT 2 [facility]    Daron Ellen, PharmD Clinical Pharmacist [facility] [phone]

## 2023-07-18 ENCOUNTER — Encounter: Payer: Self-pay | Admitting: Pharmacist

## 2023-07-18 NOTE — Progress Notes (Signed)
 Pharmacy Quality Measure Review  This patient is appearing on a report for being at risk of failing the adherence measure for cholesterol (statin) medications this calendar year.   Medication: ATORVASTATIN  40MG  Last fill date: 03/18/23 for 90 day supply  Insurance report was not up to date. No action needed at this time.  Medication has been refilled as of 06/20/23 for 90 ds with dose increase to 80 mg tablet.

## 2023-07-19 ENCOUNTER — Encounter (INDEPENDENT_AMBULATORY_CARE_PROVIDER_SITE_OTHER): Payer: Self-pay

## 2023-07-25 ENCOUNTER — Other Ambulatory Visit: Payer: Self-pay | Admitting: Family Medicine

## 2023-07-25 ENCOUNTER — Other Ambulatory Visit (INDEPENDENT_AMBULATORY_CARE_PROVIDER_SITE_OTHER)

## 2023-07-25 DIAGNOSIS — F331 Major depressive disorder, recurrent, moderate: Secondary | ICD-10-CM

## 2023-07-25 NOTE — Progress Notes (Unsigned)
 07/25/2023 Name: Frances Hoffman MRN: [id number] DOB: [birth year]  Subjective  Chief Complaint  Patient presents with   Depression    Care Team: Primary Care Provider: Felicita Horns, FNP  Reason for visit: Frances Hoffman is a 83 y.o. year old female who presented for a telephone visit.  They were referred to the pharmacist by their PCP for assistance in managing MDD  Pertinent PMH: MDD, CKD, insomnia w daytime sleepiness, vascular dementia, IBS, positional vertigo, cognitive impairment   Medication Access/Adherence: ?  Prescription drug coverage: Payor: [employer] MEDICARE / Plan: [employer] MEDICARE HMO/PPO / Product Type: *No Product type* / .   - Reports that all medications are affordable: Yes  - Patient manages their own medications/refills: Assistance from husband  Recent visit with PCP 4/29: Consideration of adding remeron  to current regimen for worsened depressive symptoms. Pharmacy input requested.  HPI: Reported Regimen:  Escitalopram  20 mg daily  Trazodone  25 mg at bedtime (reports taking full 50 mg tablet)  Patient reports concern regarding worsening depressive symptoms. No concerns or change in sleep quality/quantity. Denies difficulty falling or staying asleep at night.   Patient has their medications with then at the time of the visit.  Medication bottles reviewed with husband over the phone.  Notably, they do not have escitalopram  at home.  Pharmacy fill history shows no medication refills in 2025. Last was end of 2024. Husband confirms her medications are on automatic refill so he has not noticed this medication missing.   Outpatient Encounter Medications as of 07/25/2023   Medication Sig    acetaminophen  (TYLENOL ) 500 MG tablet Take 500 mg by mouth every 6 (six) hours as needed. PRN   aspirin  EC 81 MG tablet Take 81 mg by mouth daily. Swallow whole. Not taking (ran out) Husbands reports he will pick up more    aspirin -sod bicarb-citric acid (ALKA-SELTZER) 325 MG  TBEF tablet Take 325 mg by mouth every 6 (six) hours as needed. Using prn. Though notes good success with Tums chews   atorvastatin  (LIPITOR) 80 MG tablet Take 1 tablet (80 mg total) by mouth daily. Taking   calcium  carbonate (OS-CAL - DOSED IN MG OF ELEMENTAL CALCIUM ) 1250 (500 CA) MG tablet Take 1 tablet by mouth 2 (two) times daily with a meal. Taking   carboxymethylcellulose (REFRESH PLUS) 0.5 % SOLN Place 1 drop into both eyes 3 (three) times daily as needed (for dryness). Taking   Cholecalciferol  (VITAMIN D3) 2000 UNITS capsule Take 2,000 Units by mouth daily. Taking   COLESTID 1 g tablet Take 2 g by mouth 2 (two) times daily. For 30 days Taking   escitalopram  (LEXAPRO ) 20 MG tablet Take 1 tablet (20 mg total) by mouth daily. NOT Taking   fludrocortisone  (FLORINEF ) 0.1 MG tablet Take 1 tablet by mouth once daily Taking   levothyroxine  (SYNTHROID ) 88 MCG tablet Take 1 tablet (88 mcg total) by mouth daily. Taking   loperamide (IMODIUM A-D) 2 MG capsule Take 2 mg by mouth daily as needed. prn   meclizine  (ANTIVERT ) 50 MG tablet Take 1 tablet (50 mg total) by mouth 3 (three) times daily as needed. prn   memantine  (NAMENDA ) 10 MG tablet Take 1 tablet (10 mg total) by mouth 2 (two) times daily. Taking   metFORMIN  (GLUCOPHAGE ) 500 MG tablet Take 1 tablet (500 mg total) by mouth 2 (two) times daily with a meal. Taking   pantoprazole  (PROTONIX ) 20 MG tablet Take 1 tablet by mouth once daily Taking   silver   sulfADIAZINE  (SILVADENE ) 1 % cream Apply 1 Application topically daily. Not taking (complete)   traZODone  (DESYREL ) 50 MG tablet Take 1/2 tablet po at bedtime Taking full 50 mg tablet    No facility-administered encounter medications on file as of 07/25/2023.   Assessment and Plan:   MDD - recent worsening of depressive symptoms over the past few months. Previously, mood stabilized on escitalopram  and trazodone . Upon medication review/refill history, it was determined that patient has been off of  escitalopram  accidentally. Suspect it was removed from auto-refill at her pharmacy when a new Rx was sent in December. Historically well-tolerated/effective for patient. Lytes including sodium historically WNL on previous dose. Given good control of MDD symptoms previously on escitalopram , will plan for re-introduction at lower dose with titration after 1-2 weeks if no side effects.  All medications were reviewed with patient today and medication list was updated as appropriate.  Barriers identified / Adherence concerns: N/A Resume escitalopram  at lower dose, 10 mg once daily x7-14 days then resume 20 mg daily dose. Continue trazodone  per patient-perceived benefit, and worsening symptoms secondary to d/c of SSRI.  Defer further discussion of alternatives like mirtazepine for now. Trazodone  preferred as long as it remains effective given less c/f anticholinergic side effects and ongoing c/f xerostomia at baseline.   Future Appointments  Date Time Provider Department Center  08/08/2023 10:30 AM [place] Chuck, MD [facility] None  08/19/2023 10:10 AM [facility] MM 2 [facility]  11/14/2023 10:30 AM [facility] DX DEXA 1 [facility]  01/28/2024 10:10 AM [facility] ANNUAL WELLNESS VISIT 2 [facility]    Daron Ellen, PharmD Clinical Pharmacist [facility] [phone]

## 2023-07-25 NOTE — Patient Instructions (Addendum)
 Ms. Frances Hoffman,   It was a pleasure to speak with you today! As we discussed:?   We discussed today that when the escitalopram  (Lexapro ) refill was sent in December, it likely got kicked off of automatic-refill at the pharmacy. The last refill date was December which is likely contributing to the worsening of depressive symptoms in recent months.   Since you have been off of this medicine for a while, we will start back at a lower dose simply to ensure no side effects.  Start escitalopram  (Lexapro ) 10 mg by mouth once daily After 1 to 2 weeks, if no side effects, you may increase back to your usual dose of 20 mg daily.   Continue all other medications as you have been taking them.   Please reach out prior to your next scheduled appointment should you have any questions or concerns.  Thank you!   Future Appointments  Date Time Provider Department Center  08/08/2023 10:30 AM [place] Chuck, MD [facility] None  08/19/2023 10:10 AM [facility] MM 2 [facility]  11/14/2023 10:30 AM [facility] DX DEXA 1 [facility]  01/28/2024 10:10 AM [facility] ANNUAL WELLNESS VISIT 2 [facility]    Daron Ellen, PharmD Clinical Pharmacist [facility] [phone]

## 2023-07-26 MED ORDER — ESCITALOPRAM OXALATE 20 MG PO TABS: 20.0000 mg | ORAL_TABLET | Freq: Every day | ORAL | 3 refills | Status: AC

## 2023-07-29 ENCOUNTER — Other Ambulatory Visit (INDEPENDENT_AMBULATORY_CARE_PROVIDER_SITE_OTHER): Payer: Self-pay | Admitting: Family Medicine

## 2023-07-29 DIAGNOSIS — R7303 Prediabetes: Secondary | ICD-10-CM

## 2023-08-08 ENCOUNTER — Ambulatory Visit: Admitting: Allergy & Immunology

## 2023-08-08 ENCOUNTER — Other Ambulatory Visit: Payer: Self-pay

## 2023-08-08 ENCOUNTER — Encounter: Payer: Self-pay | Admitting: Allergy & Immunology

## 2023-08-08 VITALS — BP 108/60 | HR 85 | Temp 98.0°F | Resp 16 | Ht 61.42 in | Wt 160.0 lb

## 2023-08-08 DIAGNOSIS — K146 Glossodynia: Secondary | ICD-10-CM

## 2023-08-08 DIAGNOSIS — J300 Vasomotor rhinitis: Secondary | ICD-10-CM | POA: Diagnosis not present

## 2023-08-08 DIAGNOSIS — R067 Sneezing: Secondary | ICD-10-CM | POA: Diagnosis not present

## 2023-08-08 NOTE — Patient Instructions (Addendum)
 1. Burning mouth syndrome (Primary) - We are going to rule out some nutritional deficiencies before we start any kind of treatment. - Fe+TIBC+Fer - B12 and Folate Panel - Vitamin B6 - Zinc - We are going to look for some autoimmune diseases such as Sjogren's syndrome - ANA, IFA (with reflex) - C-reactive protein - Sedimentation rate - Rheumatoid factor - If this is all normal, we could start some medication to help calm down the nerves such as gabapentin or pregabalin. - Information on burning mouth syndrome provided.   2. Return in about 6 weeks (around 09/19/2023). You can have the follow up appointment with Dr. Idolina Maker or a Nurse Practicioner (our Nurse Practitioners are excellent and always have Physician oversight!).    Please inform us  of any Emergency Department visits, hospitalizations, or changes in symptoms. Call us  before going to the ED for breathing or allergy symptoms since we might be able to fit you in for a sick visit. Feel free to contact us  anytime with any questions, problems, or concerns.  It was a pleasure to meet you and your family today!  Websites that have reliable patient information: 1. American Academy of Asthma, Allergy, and Immunology: www.aaaai.org 2. Food Allergy Research and Education (FARE): foodallergy.org 3. Mothers of Asthmatics: http://www.asthmacommunitynetwork.org 4. American College of Allergy, Asthma, and Immunology: www.acaai.org      "Like" us  on Facebook and Instagram for our latest updates!      A healthy democracy works best when [employer] participate! Make sure you are registered to vote! If you have moved or changed any of your contact information, you will need to get this updated before voting! Scan the QR codes below to learn more!       Burning mouth syndrome -- Burning mouth syndrome is characterized by an intraoral burning sensation for which no medical or dental cause can be found [1]. Pain may be restricted to the  tongue or just the tip of the tongue and may be associated with dysesthesia, altered taste, and/or a sensation of having a dry mouth. This uncommon condition predominantly affects females in the sixth and seventh decades of life [52-54]. However, in a population-based epidemiologic study from [facility], burning mouth syndrome was most commonly diagnosed after age 79 years [55]. Approximately 30 to 50 percent of patients improve spontaneously [56]. An etiologic role for psychologic factors such as anxiety and depression has been suggested as they are common comorbidities [57-59].  Although no definitive etiology has been established, one study suggested that trigeminal small-fiber sensory neuropathy is the cause of so-called idiopathic burning mouth syndrome [60]. Other studies identified a significantly higher number of unoccupied D2 dopamine receptors in the putamen associated with painful clinical conditions [61]. In this regard, a report described a patient with burning mouth syndrome whose pain responded to pramipexole, a nonergot dopamine agonist with a high selectivity for dopaminergic D2 receptors [62]. A subsequent case series reported six patients who failed other therapies but improved after treatment with pramipexole [63].  Diagnostic criteria for burning mouth syndrome, according to the ICHD-3, require all of the following [1]:  ?Oral pain  ?Recurring daily for more than two hours per day for greater than three months  ?Pain has both of the following characteristics:  Burning quality  Felt superficially in the oral mucosa  ?Oral mucosa is of normal appearance, and clinical examination including sensory testing is normal  ?Not better accounted for by another ICHD-3 diagnosis  Prior to making the diagnosis, it is  important to rule out oral mucosal diseases, such as herpes simplex and aphthous stomatitis. Other common conditions associated with mouth pain are psychiatric disorders,  xerostomia (from drugs, connective tissue disease, or age), nutritional deficiencies (vitamin B12, iron, folate, zinc, vitamin B6), and allergic contact stomatitis. More unusual causes of mouth pain include geographic tongue, candidiasis, diabetes, denture-related pain, thyroid  abnormalities, laryngopharyngeal reflux, and menopause [64-66]. Treating the underlying cause of mouth pain, if found, usually results in the remission of the symptoms [56]. When no underlying cause of symptoms is found, the condition is considered idiopathic burning mouth syndrome.  We suggest gabapentin or pregabalin as initial pharmacologic therapy for idiopathic burning mouth syndrome. Other alternatives or adjunctive treatments include amitriptyline, clonazepam , oral or topical capsaicin, and alpha-lipoic acid. Systematic reviews of treatment trials for burning mouth syndrome found several medications may be effective, including oral and topical clonazepam , gabapentin, pregabalin, oral or topical capsaicin, and alpha lipoic acid [67,68]. The quality of evidence to support the efficacy of alpha lipoic acid is low, but it may have a role as adjunctive treatment in combination with other agents [67].

## 2023-08-08 NOTE — Progress Notes (Signed)
 NEW PATIENT  Date of Service/Encounter:  08/08/23  Consult requested by: Frances Horns, FNP   Assessment:   Burning mouth syndrome   Vasomotor rhinitis - did not do allergy testing (treats with intermittent antihistamines and it does not seem to bother her)  Sneezing - relatively rare  Milk allergy - seems dose dependent and results in oral lesions  Plan/Recommendations:   1. Burning mouth syndrome - We are going to rule out some nutritional deficiencies before we start any kind of treatment. - Fe+TIBC+Fer - B12 and Folate Panel - Vitamin B6 - Zinc - We are going to look for some autoimmune diseases such as Sjogren's syndrome - ANA, IFA (with reflex) - C-reactive protein - Sedimentation rate - Rheumatoid factor - If this is all normal, we could start some medication to help calm down the nerves such as gabapentin or pregabalin. - Information on burning mouth syndrome provided.   2. Return in about 6 weeks (around 09/19/2023). You can have the follow up appointment with Dr. Idolina Maker or a Nurse Practicioner (our Nurse Practitioners are excellent and always have Physician oversight!).   This note in its entirety was forwarded to the Provider who requested this consultation.  Subjective:   Frances Hoffman is a 83 y.o. female presenting today for evaluation of  Chief Complaint  Patient presents with   stinging mouth    Food not tasting good been going on about 3years.    Frances Hoffman has a history of the following: Patient Active Problem List   Diagnosis Date Noted   Dry mouth 06/10/2023   Otalgia of right ear 06/10/2023   Abnormal sense of taste 06/10/2023   Excessive daytime sleepiness 03/26/2023   Burning mouth syndrome 01/21/2023   Decreased GFR 01/21/2023   Dizziness 01/21/2023   Chronic renal impairment 01/01/2023   BMI 31.0-31.9,adult 12/20/2022   Eustachian tube dysfunction, right 07/26/2021   Seasonal allergic rhinitis due to pollen 07/06/2021    Irritable bowel syndrome with diarrhea 03/17/2019   B12 deficiency 02/04/2018   Xerostomia 01/23/2018   CKD (chronic kidney disease) stage 3, GFR 30-59 ml/min (HCC) 11/28/2017   Vascular dementia without behavioral disturbance (HCC) 09/27/2017   Prediabetes 09/27/2017   Cognitive impairment 07/02/2017   Cerebral vascular disease 07/02/2017   Chronic insomnia 07/02/2017   Primary osteoarthritis of first carpometacarpal joint of left hand 01/09/2017   History of breast cancer 11/14/2016   Obesity, Class I, BMI 30-34.9 08/19/2016   MDD (major depressive disorder), recurrent episode, moderate (HCC) 12/23/2015   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  Discussed the use of AI scribe software for clinical note transcription with the patient and/or guardian, who gave verbal consent to proceed.  Frances Hoffman was referred by Frances Horns, FNP.     Frances Hoffman is a 83 y.o. female presenting for an evaluation of burning mouth.  She has experienced a burning sensation in her mouth for the past three years, which has recently worsened, making eating unpleasant and leading to weight loss from 180 pounds. The burning is not specific to any particular food, including ice cream, which she used to enjoy.  She has sought previous medical evaluation  at [facility], but no definitive diagnosis was made. She has not had a biopsy of her mouth. She has seen an ear, nose, and throat specialist for other issues but did not discuss the burning mouth with her.  She experiences a sensation of heat when breathing out and sometimes finds relief by drinking cold water or using  ice. No ulcers or lesions in her mouth, except for small holes in the roof of her mouth when consuming milk, to which she is allergic.  She reports having a dry mouth but denies dry eyes. She also experiences a frequent runny nose but does not take medication for it. No joint pain, asthma, or environmental allergies.  Her current medications include vitamins C and D, but she does not take a multivitamin. She has a history of osteopenia and arthritis in her throat, which causes pain. She has never been diagnosed with RA or SLE. She does not have an ANA in the system from what I can tell.     Otherwise, there is no history of other atopic diseases, including asthma, food allergies, drug allergies, environmental allergies, or stinging insect allergies. There is no significant infectious history. Vaccinations are up to date.    Past Medical History: Patient Active Problem List   Diagnosis Date Noted   Dry mouth 06/10/2023   Otalgia of right ear 06/10/2023   Abnormal sense of taste 06/10/2023   Excessive daytime sleepiness 03/26/2023   Burning mouth syndrome 01/21/2023   Decreased GFR 01/21/2023   Dizziness 01/21/2023   Chronic renal impairment 01/01/2023   BMI 31.0-31.9,adult 12/20/2022   Eustachian tube dysfunction, right 07/26/2021   Seasonal allergic rhinitis due to pollen 07/06/2021   Irritable bowel syndrome with diarrhea 03/17/2019   B12 deficiency 02/04/2018   Xerostomia 01/23/2018   CKD (chronic kidney disease) stage 3, GFR 30-59 ml/min (HCC) 11/28/2017   Vascular dementia without behavioral disturbance (HCC) 09/27/2017   Prediabetes 09/27/2017   Cognitive impairment 07/02/2017   Cerebral vascular disease 07/02/2017   Chronic insomnia 07/02/2017   Primary osteoarthritis of first carpometacarpal joint of left hand 01/09/2017   History of breast cancer 11/14/2016   Obesity, Class I, BMI 30-34.9 08/19/2016   MDD (major depressive disorder), recurrent episode, moderate (HCC)  12/23/2015   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   Milk-related Compounds    Oxycodone  Other (See Comments)   Headaches        Medication List        Accurate as of August 08, 2023 11:43 AM. If you have any questions, ask your nurse or doctor.          acetaminophen  500 MG tablet Commonly known as: TYLENOL  Take 500 mg by mouth every 6 (six) hours as needed.   aspirin  EC 81 MG tablet Take 81 mg by mouth daily. Swallow whole.   atorvastatin  80 MG tablet Commonly known as: LIPITOR Take 1 tablet (80 mg total) by mouth daily.  calcium  carbonate 1250 (500 Ca) MG tablet Commonly known as: OS-CAL - dosed in mg of elemental calcium  Take 1 tablet by mouth 2 (two) times daily with a meal.   carboxymethylcellulose 0.5 % Soln Commonly known as: REFRESH PLUS Place 1 drop into both eyes 3 (three) times daily as needed (for dryness).   Colestid 1 g tablet Generic drug: colestipol Take 2 g by mouth 2 (two) times daily. For 30 days   escitalopram  20 MG tablet Commonly known as: LEXAPRO  Take 1 tablet (20 mg total) by mouth daily.   fludrocortisone  0.1 MG tablet Commonly known as: FLORINEF  Take 1 tablet by mouth once daily   Imodium A-D 2 MG capsule Generic drug: loperamide Take 2 mg by mouth daily as needed.   levothyroxine  88 MCG tablet Commonly known as: SYNTHROID  Take 1 tablet (88 mcg total) by mouth daily.   meclizine   50 MG tablet Commonly known as: ANTIVERT  Take 1 tablet (50 mg total) by mouth 3 (three) times daily as needed.   memantine  10 MG tablet Commonly known as: NAMENDA  Take 1 tablet (10 mg total) by mouth 2 (two) times daily.   metFORMIN  500 MG tablet Commonly known as: GLUCOPHAGE  Take 1 tablet (500 mg total) by mouth 2 (two) times daily with a meal.   pantoprazole  20 MG tablet Commonly known as: PROTONIX  Take 1 tablet by mouth once daily   traZODone  50 MG tablet Commonly known as: DESYREL  Take 1/2 tablet po at bedtime   Vitamin D3 50 MCG (2000 UT) capsule Take 2,000 Units by mouth daily.        Birth History: non-contributory  Developmental History: Wen on-contributory  Past Surgical History: Past Surgical History:  Procedure Laterality Date   BREAST LUMPECTOMY Left 2000   CHOLECYSTECTOMY N/A 02/21/2015   Procedure: LAPAROSCOPIC CHOLECYSTECTOMY WITH INTRAOPERATIVE CHOLANGIOGRAM;  Surgeon: Aldean Hummingbird, MD;  Location: [facility] OR;  Service: General;  Laterality: N/A;   COLONOSCOPY     MULTIPLE TOOTH EXTRACTIONS       Family History: Family History  Problem Relation Age of Onset   Osteoporosis Mother    Hypertension Mother    Sleep apnea Mother    Obesity Mother    Parkinsonism Father    Breast cancer Sister 19   Coronary artery disease Sister    Ovarian cancer Sister    Heart disease Son    Heart attack Son 40   Heart disease Maternal Grandmother    Heart disease Maternal Grandfather    Heart attack Maternal Grandfather 61   Breast cancer Other      Social History: Jla lives at home with her husband of 62 years.  There is hardwood throughout the home.  They live in a house that is 83 years old.  There is gas heating and central cooling.  There are no animals inside or outside of the home.  There are no dust mite covers on the bedding.  There is no tobacco exposure.  She used to work in a [employer].  There is no fume, chemical, or dust exposure.  She does  not have a HEPA filter.  [place] not [place] interstate or industrial area.   Review of systems otherwise negative other than that mentioned in the HPI.    Objective:   Blood pressure 108/60, pulse 85, temperature 98 F (36.7 C), temperature source Temporal, resp. rate 16, height 5' 1.42" (1.56 m), weight 160 lb (72.6 kg), SpO2 93%. Body mass index is 29.82 kg/m.  Physical Exam Vitals reviewed.  Constitutional:      Appearance: She is well-developed.     Comments: Pleasant. Lovely.   HENT:     Head: Normocephalic and atraumatic.     Right Ear: Tympanic membrane, ear canal and external ear normal. No drainage, swelling or tenderness. Tympanic membrane is not injected, scarred, erythematous, retracted or bulging.     Left Ear: Tympanic membrane, ear canal and external ear normal. No drainage, swelling or tenderness. Tympanic membrane is not injected, scarred, erythematous, retracted or bulging.     Nose: No nasal deformity, septal deviation, mucosal edema or rhinorrhea.     Right Turbinates: Enlarged, swollen and pale.     Left Turbinates: Enlarged, swollen and pale.     Right Sinus: No maxillary sinus tenderness or frontal sinus tenderness.     Left Sinus: No maxillary sinus tenderness or frontal sinus tenderness.     Mouth/Throat:     Lips: Pink.     Mouth: Mucous membranes are moist. Mucous membranes are not pale and not dry.     Pharynx: Uvula midline.     Comments: Minimal cobblestoning noted. No oral lesions noted at all. She does have some dental fillings, otherwise unremarkable oropharynx. Eyes:     General: Lids are normal. Allergic shiner present.        Right eye: No discharge.        Left eye: No discharge.     Conjunctiva/sclera: Conjunctivae normal.     Right eye: Right conjunctiva is not injected. No chemosis.    Left eye: Left conjunctiva is not injected. No chemosis.    Pupils: Pupils are equal, round, and reactive to light.  Cardiovascular:     Rate and  Rhythm: Normal rate and regular rhythm.     Heart sounds: Normal heart sounds.  Pulmonary:     Effort: Pulmonary effort is normal. No tachypnea, accessory muscle usage or respiratory distress.     Breath sounds: Normal breath sounds. No wheezing, rhonchi or rales.  Chest:     Chest wall: No tenderness.  Abdominal:     Tenderness: There is no abdominal tenderness. There is no guarding or rebound.  Lymphadenopathy:     Head:     Right side of head: No submandibular, tonsillar or occipital adenopathy.     Left side of head: No submandibular, tonsillar or occipital adenopathy.     Cervical: No cervical adenopathy.  Skin:    Coloration: Skin is not pale.     Findings: No abrasion, erythema, petechiae or rash. Rash is not papular, urticarial or vesicular.  Neurological:     Mental Status: She is alert.  Psychiatric:        Behavior: Behavior is cooperative.      Diagnostic studies: labs sent instead       Drexel Gentles, MD [facility] 

## 2023-08-10 ENCOUNTER — Other Ambulatory Visit (INDEPENDENT_AMBULATORY_CARE_PROVIDER_SITE_OTHER): Payer: Self-pay | Admitting: Family Medicine

## 2023-08-10 DIAGNOSIS — R7303 Prediabetes: Secondary | ICD-10-CM

## 2023-08-19 ENCOUNTER — Ambulatory Visit
Admission: RE | Admit: 2023-08-19 | Discharge: 2023-08-19 | Disposition: A | Discharge status: Home / Self Care | Payer: Medicare HMO | Source: Ambulatory Visit | Attending: Family | Admitting: Family

## 2023-08-19 ENCOUNTER — Other Ambulatory Visit (INDEPENDENT_AMBULATORY_CARE_PROVIDER_SITE_OTHER): Payer: Self-pay | Admitting: Family Medicine

## 2023-08-19 DIAGNOSIS — Z853 Personal history of malignant neoplasm of breast: Secondary | ICD-10-CM

## 2023-08-19 DIAGNOSIS — R7303 Prediabetes: Secondary | ICD-10-CM

## 2023-08-19 DIAGNOSIS — Z1231 Encounter for screening mammogram for malignant neoplasm of breast: Secondary | ICD-10-CM | POA: Diagnosis not present

## 2023-08-20 DIAGNOSIS — K580 Irritable bowel syndrome with diarrhea: Secondary | ICD-10-CM | POA: Diagnosis not present

## 2023-08-21 ENCOUNTER — Ambulatory Visit: Payer: Self-pay | Admitting: Family

## 2023-08-21 LAB — RHEUMATOID FACTOR: Rheumatoid fact SerPl-aCnc: 22.1 [IU]/mL — ABNORMAL HIGH (ref <14.0)

## 2023-08-21 LAB — IRON,TIBC AND FERRITIN PANEL
Ferritin: 238 ng/mL — ABNORMAL HIGH (ref 15–150)
Iron Saturation: 19 % (ref 15–55)
Iron: 56 ug/dL (ref 27–139)
Total Iron Binding Capacity: 298 ug/dL (ref 250–450)
UIBC: 242 ug/dL (ref 118–369)

## 2023-08-21 LAB — B12 AND FOLATE PANEL
Folate: 7.2 ng/mL (ref >3.0)
Vitamin B-12: 728 pg/mL (ref 232–1245)

## 2023-08-21 LAB — ZINC: Zinc: 63 ug/dL (ref 44–115)

## 2023-08-21 LAB — ANTINUCLEAR ANTIBODIES, IFA: ANA Titer 1: NEGATIVE

## 2023-08-21 LAB — SEDIMENTATION RATE: Sed Rate: 14 mm/h (ref 0–40)

## 2023-08-21 LAB — VITAMIN B6: Vitamin B6: 3.3 ug/L — ABNORMAL LOW (ref 3.4–65.2)

## 2023-08-21 LAB — C-REACTIVE PROTEIN: CRP: 1 mg/L (ref 0–10)

## 2023-08-28 ENCOUNTER — Ambulatory Visit: Payer: Self-pay | Admitting: Allergy & Immunology

## 2023-08-28 DIAGNOSIS — R768 Other specified abnormal immunological findings in serum: Secondary | ICD-10-CM

## 2023-09-09 ENCOUNTER — Other Ambulatory Visit: Payer: Self-pay | Admitting: Family

## 2023-09-09 DIAGNOSIS — E039 Hypothyroidism, unspecified: Secondary | ICD-10-CM

## 2023-09-16 ENCOUNTER — Telehealth: Payer: Self-pay

## 2023-09-16 NOTE — Telephone Encounter (Signed)
 Patient called and advised pantoprazole  for acid reflux and atorvastatin  for lowering cholesterol. She verbalized understanding.   Copied from CRM [id number]. Topic: Clinical - Medication Question >> Sep 16, 2023  2:17 PM Viola F wrote: Reason for CRM: Patient wants to know if the atorvastatin  (LIPITOR) 80 MG tablet and the pantoprazole  (PROTONIX ) 20 MG tablet medication are the same thing? Please call her at [phone] (H)

## 2023-09-23 ENCOUNTER — Ambulatory Visit (INDEPENDENT_AMBULATORY_CARE_PROVIDER_SITE_OTHER): Admitting: Audiology

## 2023-09-23 ENCOUNTER — Encounter: Payer: Self-pay | Admitting: Pharmacist

## 2023-09-23 NOTE — Progress Notes (Signed)
 Pharmacy Quality Measure Review  This patient is appearing on a report for being at risk of failing the adherence measure for cholesterol (statin) medications this calendar year.   Medication: Atorvastatin  Last fill date: 06/20/2023 for 90 day supply  Contacted pharmacy to facilitate refills. Rx #: [id number]

## 2023-09-24 ENCOUNTER — Ambulatory Visit (INDEPENDENT_AMBULATORY_CARE_PROVIDER_SITE_OTHER): Admitting: Audiology

## 2023-09-24 NOTE — Progress Notes (Deleted)
  [address] [place], [place] [address] [phone]  Audiological Evaluation    Name: Frances Hoffman     DOB:   30-Oct-1940      MRN:   [id number]                                                                                     Service Date: 09/24/2023     Accompanied by: ***   Patient comes today after Dr. Tobie, ENT sent a referral for a hearing evaluation due to concerns with Eustachian tube dysfunction.   Symptoms Yes Details  Hearing loss  []    Tinnitus  []    Ear pain/ infections/pressure  []    Balance problems  []    Noise exposure history  []    Previous ear surgeries  []    Family history of hearing loss  []    Amplification  []    Other  []      Otoscopy: Right ear: {otoscopy:31227} Left ear:  {otoscopy:31227}  Tympanometry: Right ear: {tympanometry results:31367}. Left ear: {tympanometry results:31367}.    Pure tone Audiometry: Right ear- *** {hearing loss types:31372::sensorineural hearing loss} from *** Hz - *** Hz. Left ear-  *** {hearing loss types:31372::sensorineural hearing loss} from *** Hz - *** Hz.  Speech Audiometry: Right ear- Speech Reception Threshold (SRT) was obtained at *** dBHL. Left ear-Speech Reception Threshold (SRT) was obtained at *** dBHL.   Word Recognition Score Tested using {word lists:31376::NU-6 (recorded)} Right ear: ***% was obtained at a presentation level of *** dBHL with contralateral masking which is deemed as  {word recognition score:31373}. Left ear: ***% was obtained at a presentation level of *** dBHL with contralateral masking which is deemed as  {word recognition score:31373}.   The hearing test results were completed under headphones and results are deemed to be of {test reliability:31390::good reliability}. Test technique:  conventional    Impression: {Word recognition Score interpretation:31432::There is not a significant difference in pure-tone thresholds between ears.,There is not a  significant difference in the word recognition score in between ears. }   Recommendations: {Audiology Recommendations:31370::Follow up with ENT as scheduled for today.}   Delilah Mulgrew MARIE LEROUX-MARTINEZ, AUD

## 2023-09-26 ENCOUNTER — Ambulatory Visit (INDEPENDENT_AMBULATORY_CARE_PROVIDER_SITE_OTHER): Admitting: Otolaryngology

## 2023-09-26 ENCOUNTER — Encounter (INDEPENDENT_AMBULATORY_CARE_PROVIDER_SITE_OTHER): Payer: Self-pay | Admitting: Otolaryngology

## 2023-09-26 VITALS — BP 100/65 | HR 81 | Ht 64.0 in | Wt 158.0 lb

## 2023-09-26 DIAGNOSIS — K146 Glossodynia: Secondary | ICD-10-CM

## 2023-09-26 DIAGNOSIS — H938X1 Other specified disorders of right ear: Secondary | ICD-10-CM

## 2023-09-26 DIAGNOSIS — K117 Disturbances of salivary secretion: Secondary | ICD-10-CM

## 2023-09-26 DIAGNOSIS — M26621 Arthralgia of right temporomandibular joint: Secondary | ICD-10-CM

## 2023-09-26 MED ORDER — PILOCARPINE HCL 7.5 MG PO TABS
7.5000 mg | ORAL_TABLET | Freq: Three times a day (TID) | ORAL | 0 refills | Status: DC
Start: 2023-09-26 — End: 2023-12-26

## 2023-09-26 NOTE — Progress Notes (Signed)
 Dear Dr. Corwin, Here is my assessment for our mutual patient, Frances Hoffman. Thank you for allowing me the opportunity to care for your patient. Please do not hesitate to contact me should you have any other questions. Sincerely, Dr. Eldora Blanch  Otolaryngology Clinic Note Referring provider: Dr. Corwin HPI:  Frances Hoffman is a 83 y.o. female kindly referred by Dr. Corwin for evaluation of burning mouth and right ear discomfort.  Initial visit (09/2023): She reports that she has had many year history of stinging and burning in her mouth, ongoing for many years. She also reports that she has chronic dry mouth, has tried biotene without improvement. She saw an ENT in 2020 for this, who recommend change in her medications but no help. Nothing makes it better or worse. Also affecting her taste. No autoimmune complaints including rash/JP. She has had labwork for this (see below). She has also been seen by Dr. Iva in June 2025 who did the workup and AI workup with possible plan for gabapentin if she wishes. No DM. No dental pain. No significant tobacco history  Patient otherwise denies: - dysphagia, odynophagia, unintentional weight loss - changes in voice, shortness of breath, hemoptysis  Of note, she also complaints of fullness in her ear. Some discomfort right ear as well. All the time, ongoing for at least 3-4 years ago. Some shooting pain. Nothing brings this about or make it worse. No neck pain, denies brusixm Patient denies: vertigo, drainage, tinnitus, no hearing change Patient additionally denies: deep pain in ear canal, eustachian tube symptoms such as popping/crackling, sensitive to pressure changes Patient also denies barotrauma, vestibular suppressant use, ototoxic medication use Prior ear surgery: no  ENT Surgery: neck surgery (unclear what? - she was a child) Personal or FHx of bleeding dz or anesthesia difficulty: no  AP/AC: ASA 81  Tobacco: no  PMHx: Hypothyroidism, DM,  GERD, MDD, CVA, h/o breast cancer?, Cog impair  Independent Review of Additional Tests or Records:  Frances Hoffman (07/02/2023): noted burning mouth pain and absent taste and smell; ongoing fatigue, altered taste; some Depression sx; not using Zyrtec regularly; Dx: Burning mouth and xerostomia and abnormal sense of taste; Rx: ref to ENT Frances Hoffman 06/10/2023: sinusitis, given pred and amox with some ear fullness; stinging of tongue and constant; dry mouth; Dx: Sinusitis, burning mouth MRI Brain 02/22/2023: no significant sinonasal opacification; no mastoid opacification Labs (08/08/2023): RF, Sed, CRP, ANA, Zinc , B6, B12, Folate: RF elev; B6 low (3.3); B12/Folate wnl; TSH 05/07/2023: wnl Dr. Shermon (08/01/2018): noted burning of her tongue, dysguesia; taken B12, salagen ; unremkarlable workup; Possible pramipexole trial  Dr. Luciano (11/08/2021): Right ear fullness, ETD  PMH/Meds/All/SocHx/FamHx/ROS:   Past Medical History:  Diagnosis Date   Adjustment disorder with mixed anxiety and depressed mood 12/12/2007   Qualifier: Diagnosis of  By: Copland MD, Spencer     B12 deficiency    Breast cancer (HCC)    Cancer (HCC) 2000   breast had lumpectomy/radiation x36,mammo ,no chemo   Cataract of both eyes    Depression    Dysuria-frequency syndrome    takes AZO   Family history of heart disease 08/27/2013   Frozen shoulder    Gall stones    GERD 12/12/2007   Qualifier: Diagnosis of  By: Copland MD, Spencer     Hyperlipidemia    Hypothyroidism 12/12/2007   Qualifier: Diagnosis of  By: Copland MD, Spencer     Lactose intolerance    Memory difficulties 09/27/2017   Memory loss  Memory loss 09/22/2014   Osteopenia    BMD 2004, WNL 2008   Personal history of radiation therapy    Pneumonia    Recurrent UTI    Shingles    chronic body pain, left side of body   Shortness of breath dyspnea    when climbing stairs only   Vitamin D  deficiency    Wears glasses      Past Surgical History:   Procedure Laterality Date   BREAST LUMPECTOMY Left 2000   CHOLECYSTECTOMY N/A 02/21/2015   Procedure: LAPAROSCOPIC CHOLECYSTECTOMY WITH INTRAOPERATIVE CHOLANGIOGRAM;  Surgeon: Camellia Blush, MD;  Location: [facility] OR;  Service: General;  Laterality: N/A;   COLONOSCOPY     MULTIPLE TOOTH EXTRACTIONS      Family History  Problem Relation Age of Onset   Osteoporosis Mother    Hypertension Mother    Sleep apnea Mother    Obesity Mother    Parkinsonism Father    Breast cancer Sister 10   Coronary artery disease Sister    Ovarian cancer Sister    Heart disease Son    Heart attack Son 9   Heart disease Maternal Grandmother    Heart disease Maternal Grandfather    Heart attack Maternal Grandfather 42   Breast cancer Other      Social Connections: Moderately Integrated (01/28/2023)   Social Connection and Isolation Panel    Frequency of Communication with Friends and Family: More than three times a week    Frequency of Social Gatherings with Friends and Family: Once a week    Attends Religious Services: More than 4 times per year    Active Member of [employer] or Organizations: No    Attends Banker Meetings: Never    Marital Status: Married      Current Outpatient Medications:    acetaminophen  (TYLENOL ) 500 MG tablet, Take 500 mg by mouth every 6 (six) hours as needed., Disp: , Rfl:    aspirin  EC 81 MG tablet, Take 81 mg by mouth daily. Swallow whole., Disp: , Rfl:    atorvastatin  (LIPITOR) 80 MG tablet, Take 1 tablet (80 mg total) by mouth daily., Disp: 90 tablet, Rfl: 3   calcium  carbonate (OS-CAL - DOSED IN MG OF ELEMENTAL CALCIUM ) 1250 (500 CA) MG tablet, Take 1 tablet by mouth 2 (two) times daily with a meal., Disp: , Rfl:    carboxymethylcellulose (REFRESH PLUS) 0.5 % SOLN, Place 1 drop into both eyes 3 (three) times daily as needed (for dryness)., Disp: , Rfl:    Cholecalciferol  (VITAMIN D3) 2000 UNITS capsule, Take 2,000 Units by mouth daily., Disp: , Rfl:    COLESTID  1 g tablet, Take 2 g by mouth 2 (two) times daily. For 30 days, Disp: , Rfl:    escitalopram  (LEXAPRO ) 20 MG tablet, Take 1 tablet (20 mg total) by mouth daily., Disp: 90 tablet, Rfl: 3   fludrocortisone  (FLORINEF ) 0.1 MG tablet, Take 1 tablet by mouth once daily, Disp: 30 tablet, Rfl: 10   levothyroxine  (SYNTHROID ) 88 MCG tablet, Take 1 tablet by mouth once daily, Disp: 90 tablet, Rfl: 0   loperamide (IMODIUM A-D) 2 MG capsule, Take 2 mg by mouth daily as needed., Disp: , Rfl:    meclizine  (ANTIVERT ) 50 MG tablet, Take 1 tablet (50 mg total) by mouth 3 (three) times daily as needed., Disp: 30 tablet, Rfl: 0   memantine  (NAMENDA ) 10 MG tablet, Take 1 tablet (10 mg total) by mouth 2 (two) times daily., Disp: 180  tablet, Rfl: 3   metFORMIN  (GLUCOPHAGE ) 500 MG tablet, Take 1 tablet (500 mg total) by mouth 2 (two) times daily with a meal., Disp: 180 tablet, Rfl: 0   omeprazole  (PRILOSEC ) 20 MG capsule, 1 capsule 30 minutes before morning meal Orally Once a day; Duration: 30 day(s), Disp: , Rfl:    pantoprazole  (PROTONIX ) 20 MG tablet, Take 1 tablet by mouth once daily, Disp: 30 tablet, Rfl: 5   pilocarpine  (SALAGEN ) 7.5 MG tablet, Take 1 tablet (7.5 mg total) by mouth 3 (three) times daily., Disp: 90 tablet, Rfl: 0   traZODone  (DESYREL ) 50 MG tablet, Take 1/2 tablet po at bedtime, Disp: 30 tablet, Rfl: 3   Physical Exam:   BP 100/65 (BP Location: Left Arm, Patient Position: Sitting, Cuff Size: Large)   Pulse 81   Ht 5' 4 (1.626 m)   Wt 158 lb (71.7 kg)   SpO2 (!) 88%   BMI 27.12 kg/m   Salient findings:  CN II-XII intact  Bilateral EAC clear and TM intact with well pneumatized middle ear spaces Weber 512: mid Rinne 512: AC > BC b/l  Anterior rhinoscopy: Septum intact; bilateral inferior turbinates without significant hypertrophy Mild TMJ crepitus; area of pain she points to is pre-auricular No lesions of oral cavity/oropharynx; tongue soft, no inflammatory changes on tongue or changes  consistent with candidiasis; modest oral cavity dryness; slight loss of papillae; no geographic changes No obviously palpable neck masses/lymphadenopathy/thyromegaly No respiratory distress or stridor  Seprately Identifiable Procedures:  Prior to initiating any procedures, risks/benefits/alternatives were explained to the patient and verbal consent obtained. None today  Impression & Plans:  Terra Aveni is a 83 y.o. female with:  1. Burning mouth syndrome   2. Xerostomia   3. Ear fullness, right   4. Arthralgia of right temporomandibular joint    Has had chronic symptoms (at least > 5 years), and fairly extensive workup. Given her sx and co-morbidities, suspect burning mouth for which the etiologies we discussed - multiple contributors including dry mouth, AI, meds. Advised to d/w PCP re: + RF She has not tried salagen  for her xerostomia and will try that Start multivitamin Dr. Iva planning possible gabapentin for her so will not prescribe that today  From ear standpoint, no infection. Suspect referred source, most likely TMJ; we discussed conservative options including compresses, NSAIDs, temporary soft diet. She will also discuss with her dentist.  If no improvement over next few weeks, advised to call back   See below regarding exact medications prescribed this encounter including dosages and route: Meds ordered this encounter  Medications   pilocarpine  (SALAGEN ) 7.5 MG tablet    Sig: Take 1 tablet (7.5 mg total) by mouth 3 (three) times daily.    Dispense:  90 tablet    Refill:  0      Thank you for allowing me the opportunity to care for your patient. Please do not hesitate to contact me should you have any other questions.  Sincerely, Eldora Blanch, MD Otolaryngologist (ENT), [facility] Phone: [phone] Fax: [phone]  09/26/2023, 12:20 PM   MDM:  Level 4 - [phone] Complexity/Problems addressed: mod - multiple chronic issues Data  complexity: high - independent interpretation of imaging; review of notes, labs - Morbidity: mod  - Prescription Drug prescribed or managed: y

## 2023-09-30 ENCOUNTER — Telehealth (INDEPENDENT_AMBULATORY_CARE_PROVIDER_SITE_OTHER): Payer: Self-pay | Admitting: Otolaryngology

## 2023-09-30 NOTE — Telephone Encounter (Signed)
 Patient called in stating that medication for mouth did not work.  Caused her to get hot and sweatty and caused severe ear pain.  Would like for Dr. Anthony nurse to call.

## 2023-10-06 ENCOUNTER — Other Ambulatory Visit (INDEPENDENT_AMBULATORY_CARE_PROVIDER_SITE_OTHER): Payer: Self-pay | Admitting: Family Medicine

## 2023-10-06 DIAGNOSIS — R7303 Prediabetes: Secondary | ICD-10-CM

## 2023-10-08 ENCOUNTER — Other Ambulatory Visit: Payer: Self-pay

## 2023-10-08 ENCOUNTER — Ambulatory Visit: Admitting: Allergy & Immunology

## 2023-10-08 ENCOUNTER — Encounter: Payer: Self-pay | Admitting: Allergy & Immunology

## 2023-10-08 VITALS — BP 120/72 | HR 78 | Temp 98.0°F | Resp 16 | Ht 62.21 in | Wt 161.4 lb

## 2023-10-08 DIAGNOSIS — J300 Vasomotor rhinitis: Secondary | ICD-10-CM

## 2023-10-08 DIAGNOSIS — R768 Other specified abnormal immunological findings in serum: Secondary | ICD-10-CM | POA: Diagnosis not present

## 2023-10-08 DIAGNOSIS — K146 Glossodynia: Secondary | ICD-10-CM

## 2023-10-08 MED ORDER — GABAPENTIN 300 MG PO CAPS: ORAL_CAPSULE | ORAL | 1 refills | Status: DC

## 2023-10-08 NOTE — Patient Instructions (Addendum)
 1. Burning mouth syndrome - Vitamin eval showed B6, so the multivitamin should be helping.  - You had an elevated Rheumatoid Factor, so you have an appointment with Dr. Jeannetta (Rheumatology) on December 9th at 9:20am - We are going to start gabapentin  300mg   If this is all normal, we could start some medication to help calm down the nerves such as gabapentin  or pregabalin. - Information on burning mouth syndrome provided.   2. Return in about 2 months (around 12/08/2023). You can have the follow up appointment with Dr. Iva or a Nurse Practicioner (our Nurse Practitioners are excellent and always have Physician oversight!).    Please inform us  of any Emergency Department visits, hospitalizations, or changes in symptoms. Call us  before going to the ED for breathing or allergy symptoms since we might be able to fit you in for a sick visit. Feel free to contact us  anytime with any questions, problems, or concerns.  It was a pleasure to see you guys again today!  Websites that have reliable patient information: 1. American Academy of Asthma, Allergy, and Immunology: www.aaaai.org 2. Food Allergy Research and Education (FARE): foodallergy.org 3. Mothers of Asthmatics: http://www.asthmacommunitynetwork.org 4. American College of Allergy, Asthma, and Immunology: www.acaai.org      "Like" us  on Facebook and Instagram for our latest updates!      A healthy democracy works best when [employer] participate! Make sure you are registered to vote! If you have moved or changed any of your contact information, you will need to get this updated before voting! Scan the QR codes below to learn more!       Burning mouth syndrome -- Burning mouth syndrome is characterized by an intraoral burning sensation for which no medical or dental cause can be found [1]. Pain may be restricted to the tongue or just the tip of the tongue and may be associated with dysesthesia, altered taste, and/or a sensation of  having a dry mouth. This uncommon condition predominantly affects females in the sixth and seventh decades of life [52-54]. However, in a population-based epidemiologic study from [facility], burning mouth syndrome was most commonly diagnosed after age 77 years [55]. Approximately 30 to 50 percent of patients improve spontaneously [56]. An etiologic role for psychologic factors such as anxiety and depression has been suggested as they are common comorbidities [57-59].  Although no definitive etiology has been established, one study suggested that trigeminal small-fiber sensory neuropathy is the cause of so-called idiopathic burning mouth syndrome [60]. Other studies identified a significantly higher number of unoccupied D2 dopamine receptors in the putamen associated with painful clinical conditions [61]. In this regard, a report described a patient with burning mouth syndrome whose pain responded to pramipexole, a nonergot dopamine agonist with a high selectivity for dopaminergic D2 receptors [62]. A subsequent case series reported six patients who failed other therapies but improved after treatment with pramipexole [63].  Diagnostic criteria for burning mouth syndrome, according to the ICHD-3, require all of the following [1]:  ?Oral pain  ?Recurring daily for more than two hours per day for greater than three months  ?Pain has both of the following characteristics:  Burning quality  Felt superficially in the oral mucosa  ?Oral mucosa is of normal appearance, and clinical examination including sensory testing is normal  ?Not better accounted for by another ICHD-3 diagnosis  Prior to making the diagnosis, it is important to rule out oral mucosal diseases, such as herpes simplex and aphthous stomatitis. Other common conditions associated  with mouth pain are psychiatric disorders, xerostomia (from drugs, connective tissue disease, or age), nutritional deficiencies (vitamin B12, iron, folate,  zinc , vitamin B6), and allergic contact stomatitis. More unusual causes of mouth pain include geographic tongue, candidiasis, diabetes, denture-related pain, thyroid  abnormalities, laryngopharyngeal reflux, and menopause [64-66]. Treating the underlying cause of mouth pain, if found, usually results in the remission of the symptoms [56]. When no underlying cause of symptoms is found, the condition is considered idiopathic burning mouth syndrome.  We suggest gabapentin  or pregabalin as initial pharmacologic therapy for idiopathic burning mouth syndrome. Other alternatives or adjunctive treatments include amitriptyline, clonazepam , oral or topical capsaicin, and alpha-lipoic acid. Systematic reviews of treatment trials for burning mouth syndrome found several medications may be effective, including oral and topical clonazepam , gabapentin , pregabalin, oral or topical capsaicin, and alpha lipoic acid [67,68]. The quality of evidence to support the efficacy of alpha lipoic acid is low, but it may have a role as adjunctive treatment in combination with other agents [67].

## 2023-10-08 NOTE — Progress Notes (Unsigned)
 FOLLOW UP  Date of Service/Encounter:  10/08/23   Assessment:   Burning mouth syndrome    Vasomotor rhinitis - did not do allergy testing (treats with intermittent antihistamines and it does not seem to bother her)   Sneezing - relatively rare   Milk allergy - seems dose dependent and results in oral lesions  Plan/Recommendations:   There are no Patient Instructions on file for this visit.   Subjective:   Frances Hoffman is a 83 y.o. female presenting today for follow up of No chief complaint on file.   Frances Hoffman has a history of the following: Patient Active Problem List   Diagnosis Date Noted   Dry mouth 06/10/2023   Otalgia of right ear 06/10/2023   Abnormal sense of taste 06/10/2023   Excessive daytime sleepiness 03/26/2023   Burning mouth syndrome 01/21/2023   Decreased GFR 01/21/2023   Dizziness 01/21/2023   Chronic renal impairment 01/01/2023   BMI 31.0-31.9,adult 12/20/2022   Eustachian tube dysfunction, right 07/26/2021   Seasonal allergic rhinitis due to pollen 07/06/2021   Irritable bowel syndrome with diarrhea 03/17/2019   B12 deficiency 02/04/2018   Xerostomia 01/23/2018   CKD (chronic kidney disease) stage 3, GFR 30-59 ml/min (HCC) 11/28/2017   Vascular dementia without behavioral disturbance (HCC) 09/27/2017   Prediabetes 09/27/2017   Cognitive impairment 07/02/2017   Cerebral vascular disease 07/02/2017   Chronic insomnia 07/02/2017   Primary osteoarthritis of first carpometacarpal joint of left hand 01/09/2017   History of breast cancer 11/14/2016   Obesity, Class I, BMI 30-34.9 08/19/2016   MDD (major depressive disorder), recurrent episode, moderate (HCC) 12/23/2015   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   Orthostatic hypotension 09/25/2015   Fecal incontinence 07/29/2015   Exercise intolerance 07/30/2014   Positional vertigo 08/18/2013   Fatigue 08/15/2009   Osteopenia 11/26/2008   Recurrent UTI 01/15/2008   Hypothyroidism  12/12/2007   Hyperlipidemia 12/12/2007   Chronic GERD 12/12/2007    History obtained from: chart review and {Persons; PED relatives w/patient:19415::patient}.  Discussed the use of AI scribe software for clinical note transcription with the patient and/or guardian, who gave verbal consent to proceed.  Frances Hoffman is a 83 y.o. female presenting for {Blank single:19197::a food challenge,a drug challenge,skin testing,a sick visit,an evaluation of ***,a follow up visit}.  She was last seen in June 2025.  At that time, we obtained some labs to rule out nutritional deficiencies as a cause of her burning mouth syndrome.  We also looked for some autoimmune diseases.  We talked about doing gabapentin  or pregabalin.  We gave her information on the burning mouth syndrome.  Her labs were mostly normal.  Ferritin was slightly elevated.  Other iron studies were within normal limits.  Vitamin B6 was slightly low.  We recommended starting an over-the-counter supplement.  B12 and zinc  were normal.  Rheumatoid factor was positive.  We decided to send her to see rheumatology.  ANA and inflammatory markers were negative.  Asthma/Respiratory Symptom History: ***  Allergic Rhinitis Symptom History: ***  Food Allergy Symptom History: ***  Skin Symptom History: ***  GERD Symptom History: ***  Infection Symptom History: ***  Otherwise, there have been no changes to her past medical history, surgical history, family history, or social history.    Review of systems otherwise negative other than that mentioned in the HPI.    Objective:   There were no vitals taken for this visit. There is no height or weight on file to calculate BMI.  Physical Exam   Diagnostic studies: {Blank single:19197::none,deferred due to recent antihistamine use,deferred due to insurance stipulations that require a separate visit for testing,labs sent instead, }  Spirometry: {Blank single:19197::results  normal (FEV1: ***%, FVC: ***%, FEV1/FVC: ***%),results abnormal (FEV1: ***%, FVC: ***%, FEV1/FVC: ***%)}.    {Blank single:19197::Spirometry consistent with mild obstructive disease,Spirometry consistent with moderate obstructive disease,Spirometry consistent with severe obstructive disease,Spirometry consistent with possible restrictive disease,Spirometry consistent with mixed obstructive and restrictive disease,Spirometry uninterpretable due to technique,Spirometry consistent with normal pattern}. {Blank single:19197::Albuterol /Atrovent nebulizer,Xopenex/Atrovent nebulizer,Albuterol  nebulizer,Albuterol  four puffs via MDI,Xopenex four puffs via MDI} treatment given in clinic with {Blank single:19197::significant improvement in FEV1 per ATS criteria,significant improvement in FVC per ATS criteria,significant improvement in FEV1 and FVC per ATS criteria,improvement in FEV1, but not significant per ATS criteria,improvement in FVC, but not significant per ATS criteria,improvement in FEV1 and FVC, but not significant per ATS criteria,no improvement}.  Allergy Studies: {Blank single:19197::none,deferred due to recent antihistamine use,deferred due to insurance stipulations that require a separate visit for testing,labs sent instead, }    {Blank single:19197::Allergy testing results were read and interpreted by myself, documented by clinical staff., }      Marty Shaggy, MD  [facility] 

## 2023-10-10 ENCOUNTER — Telehealth: Payer: Self-pay | Admitting: Allergy & Immunology

## 2023-10-10 NOTE — Telephone Encounter (Signed)
 Husband of Pt called and stated that after doing some research online, they have decided that she will not be taking the gabapentin  (NEURONTIN ) 300 MG capsule [[id number]]. He states that he just wanted to communicate this to you. She did not take any of the pills.

## 2023-10-11 NOTE — Telephone Encounter (Signed)
 I am not surprised at all. Thanks for the update!   Marty Shaggy, MD [facility] 

## 2023-10-29 NOTE — Telephone Encounter (Signed)
 error

## 2023-11-04 ENCOUNTER — Other Ambulatory Visit: Payer: Self-pay | Admitting: Family

## 2023-11-04 DIAGNOSIS — F5104 Psychophysiologic insomnia: Secondary | ICD-10-CM

## 2023-11-14 ENCOUNTER — Other Ambulatory Visit: Payer: Medicare HMO

## 2023-11-14 ENCOUNTER — Ambulatory Visit
Admission: RE | Admit: 2023-11-14 | Discharge: 2023-11-14 | Disposition: A | Discharge status: Home / Self Care | Source: Ambulatory Visit | Attending: Family | Admitting: Family

## 2023-11-14 DIAGNOSIS — M85851 Other specified disorders of bone density and structure, right thigh: Secondary | ICD-10-CM | POA: Insufficient documentation

## 2023-11-25 ENCOUNTER — Telehealth: Payer: Self-pay | Admitting: Family

## 2023-11-25 NOTE — Telephone Encounter (Signed)
 Spoke with pt's husband, Vaughan. Advised him that this medication is given to her by her GI doctor. I have given him the phone number to [facility] to contact for this refill. He verbalized understanding and appreciation.

## 2023-11-25 NOTE — Telephone Encounter (Signed)
 Copied from CRM [id number]. Topic: General - Other >> Nov 25, 2023 12:05 PM Rosina BIRCH wrote: Reason for CRM: patient husband called stating the patient has been having diarrhea for years and now she is having less diarrhea now due to her taking the medication colestipol. Patient husband want to get a refill on the medication that will be less than 240 pills because she is taking one a day CB [phone]

## 2023-12-24 ENCOUNTER — Other Ambulatory Visit: Payer: Self-pay | Admitting: Family

## 2023-12-24 DIAGNOSIS — E039 Hypothyroidism, unspecified: Secondary | ICD-10-CM

## 2023-12-25 ENCOUNTER — Ambulatory Visit: Payer: Self-pay

## 2023-12-25 NOTE — Telephone Encounter (Signed)
 Appointment scheduled with Tabitha 12/27/2023.

## 2023-12-25 NOTE — Telephone Encounter (Signed)
 FYI Only or Action Required?: FYI only for provider.  Patient was last seen in primary care on 07/02/2023 by Corwin Antu, FNP.  Called Nurse Triage reporting Otalgia.  Symptoms began several months ago.  Interventions attempted: Nothing.  Symptoms are: gradually worsening.  Triage Disposition: See Physician Within 24 Hours  Patient/caregiver understands and will follow disposition?: Yes  Copied from CRM #[id number]. Topic: Clinical - Red Word Triage >> Dec 25, 2023 10:45 AM Franky GRADE wrote: Red Word that prompted transfer to Nurse Triage: Patient is experiencing an earache, states has been dealing with the pain but it is becoming to painful. Reason for Disposition  Earache  (Exceptions: Brief ear pain of lasting less than 60 minutes, or earache occurring during air travel.)  Answer Assessment - Initial Assessment Questions 1. LOCATION: Which ear is involved?     Right ear pain 2. ONSET: When did the ear pain start?      Months ago 3. SEVERITY: How bad is the pain?  (Scale 1-10; mild, moderate or severe)     5/10 4. URI SYMPTOMS: Do you have a runny nose or cough?     Always has a runny nose 5. FEVER: Do you have a fever? If Yes, ask: What is your temperature, how was it measured, and when did it start?     denies 6. CAUSE: Have you been swimming recently?, How often do you use Q-TIPS?, Have you had any recent air travel or scuba diving?     Admits to use of q tips 7. OTHER SYMPTOMS: Do you have any other symptoms? (e.g., decreased hearing, dizziness, headache, stiff neck, vomiting)     denies 8. PREGNANCY: Is there any chance you are pregnant? When was your last menstrual period?     N/a  Protocols used: Rilla

## 2023-12-26 ENCOUNTER — Encounter (HOSPITAL_COMMUNITY): Payer: Self-pay

## 2023-12-26 ENCOUNTER — Ambulatory Visit (HOSPITAL_COMMUNITY)
Admission: EM | Admit: 2023-12-26 | Discharge: 2023-12-26 | Disposition: A | Discharge status: Home / Self Care | Attending: Family Medicine | Admitting: Family Medicine

## 2023-12-26 VITALS — BP 134/72 | HR 64 | Temp 98.2°F | Resp 16

## 2023-12-26 DIAGNOSIS — H9201 Otalgia, right ear: Secondary | ICD-10-CM | POA: Diagnosis not present

## 2023-12-26 MED ORDER — PREDNISONE 20 MG PO TABS: 40.0000 mg | ORAL_TABLET | Freq: Every day | ORAL | 0 refills | Status: DC

## 2023-12-26 NOTE — ED Triage Notes (Signed)
 Patient here today with c/o right ear pain off and on for the past 2-3 years. Patient has been seen by specialists and findings were inconclusive but she is still having pain. Denies hearing loss.

## 2023-12-26 NOTE — Telephone Encounter (Signed)
 NOTED

## 2023-12-26 NOTE — ED Provider Notes (Signed)
 [facility]   [id number] 12/26/23 Arrival Time: 1124  ASSESSMENT & PLAN:  1. Right ear pain   Acute on chronic. Unclear etiology. She may elect f/u with ENT. Has seen approx 1 year ago.  Trial of: Meds ordered this encounter  Medications   predniSONE  (DELTASONE ) 20 MG tablet    Sig: Take 2 tablets (40 mg total) by mouth daily.    Dispense:  10 tablet    Refill:  0     Follow-up Information     Schedule an appointment as soon as possible for a visit  with Corwin Antu, FNP.   Specialty: Family Medicine Why: For follow up. Contact information: [address] [place] [address] [phone]                 Reviewed expectations re: course of current medical issues. Questions answered. Outlined signs and symptoms indicating need for more acute intervention. Understanding verbalized. After Visit Summary given.   SUBJECTIVE: History from: Patient. Frances Hoffman is a 83 y.o. female. Patient here today with c/o right ear pain off and on for the past 2-3 years. Patient has been seen by specialists and findings were inconclusive but she is still having pain. Denies hearing loss.   OBJECTIVE:  Vitals:   12/26/23 1140  BP: 134/72  Pulse: 64  Resp: 16  Temp: 98.2 F (36.8 C)  TempSrc: Oral  SpO2: 95%    General appearance: alert; no distress Eyes: PERRLA; EOMI; conjunctiva normal HENT: [place]; AT; without nasal congestion; R and L TM appear normal and appear the same Neck: supple  Lungs: speaks full sentences without difficulty; unlabored Extremities: no edema Skin: warm and dry Neurologic: normal gait Psychological: alert and cooperative; normal mood and affect    Allergies  Allergen Reactions   Ciprofloxacin  Other (See Comments)    Unknown (vertigo??)   Citalopram  Hydrobromide Other (See Comments)    Intrusive/ odd thoughts    Flagyl  [Metronidazole ] Other (See Comments)    Unknown??   Milk-Related Compounds    Oxycodone   Other (See Comments)    Headaches    Past Medical History:  Diagnosis Date   Adjustment disorder with mixed anxiety and depressed mood 12/12/2007   Qualifier: Diagnosis of  By: Copland MD, Spencer     B12 deficiency    Breast cancer (HCC)    Cancer (HCC) 2000   breast had lumpectomy/radiation x36,mammo ,no chemo   Cataract of both eyes    Depression    Dysuria-frequency syndrome    takes AZO   Family history of heart disease 08/27/2013   Frozen shoulder    Gall stones    GERD 12/12/2007   Qualifier: Diagnosis of  By: Copland MD, Spencer     Hyperlipidemia    Hypothyroidism 12/12/2007   Qualifier: Diagnosis of  By: Watt MD, Spencer     Lactose intolerance    Memory difficulties 09/27/2017   Memory loss    Memory loss 09/22/2014   Osteopenia    BMD 2004, WNL 2008   Personal history of radiation therapy    Pneumonia    Recurrent UTI    Shingles    chronic body pain, left side of body   Shortness of breath dyspnea    when climbing stairs only   Vitamin D  deficiency    Wears glasses    Social History   Socioeconomic History   Marital status: Married    Spouse name: Vaughan    Number  of children: 2   Years of education: High school   Highest education level: High school graduate  Occupational History   Not on file  Tobacco Use   Smoking status: Never   Smokeless tobacco: Never  Vaping Use   Vaping status: Never Used  Substance and Sexual Activity   Alcohol  use: No    Alcohol /week: 0.0 standard drinks of alcohol    Drug use: No   Sexual activity: Yes    Partners: Male    Birth control/protection: Post-menopausal  Other Topics Concern   Not on file  Social History Narrative   Lives at home with her husband. GLENWOOD Clark   2 adult sons  - [place]ita and Camellia   2 Grandchildren - both other   Exercise: goes to silver  sneakers - at least 2 times a week   Diet: anything that tastes good, less pepsi's with the tongue   Enjoys: square dance, eating out with friends,  going to church - Sunday and Wednesday   Social Drivers of Health   Financial Resource Strain: Low Risk  (01/28/2023)   Overall Financial Resource Strain (CARDIA)    Difficulty of Paying Living Expenses: Not hard at all  Food Insecurity: No Food Insecurity (01/28/2023)   Hunger Vital Sign    Worried About Running Out of Food in the Last Year: Never true    Ran Out of Food in the Last Year: Never true  Transportation Needs: No Transportation Needs (01/28/2023)   PRAPARE - Administrator, Civil Service (Medical): No    Lack of Transportation (Non-Medical): No  Physical Activity: Insufficiently Active (01/28/2023)   Exercise Vital Sign    Days of Exercise per Week: 3 days    Minutes of Exercise per Session: 10 min  Stress: No Stress Concern Present (01/28/2023)   [employer] of Occupational Health - Occupational Stress Questionnaire    Feeling of Stress : Not at all  Social Connections: Moderately Integrated (01/28/2023)   Social Connection and Isolation Panel    Frequency of Communication with Friends and Family: More than three times a week    Frequency of Social Gatherings with Friends and Family: Once a week    Attends Religious Services: More than 4 times per year    Active Member of [employer] or Organizations: No    Attends Banker Meetings: Never    Marital Status: Married  Catering manager Violence: Not At Risk (01/28/2023)   Humiliation, Afraid, Rape, and Kick questionnaire    Fear of Current or Ex-Partner: No    Emotionally Abused: No    Physically Abused: No    Sexually Abused: No   Family History  Problem Relation Age of Onset   Osteoporosis Mother    Hypertension Mother    Sleep apnea Mother    Obesity Mother    Parkinsonism Father    Breast cancer Sister 21   Coronary artery disease Sister    Ovarian cancer Sister    Heart disease Son    Heart attack Son 63   Heart disease Maternal Grandmother    Heart disease Maternal Grandfather     Heart attack Maternal Grandfather 92   Breast cancer Other    Past Surgical History:  Procedure Laterality Date   BREAST LUMPECTOMY Left 2000   CHOLECYSTECTOMY N/A 02/21/2015   Procedure: LAPAROSCOPIC CHOLECYSTECTOMY WITH INTRAOPERATIVE CHOLANGIOGRAM;  Surgeon: Camellia Blush, MD;  Location: [facility] OR;  Service: General;  Laterality: N/A;   COLONOSCOPY  MULTIPLE TOOTH DORITA Rolinda Rogue, MD 12/26/23 [phone]

## 2023-12-27 ENCOUNTER — Ambulatory Visit: Admitting: Family

## 2023-12-31 ENCOUNTER — Other Ambulatory Visit: Payer: Self-pay | Admitting: Family

## 2023-12-31 DIAGNOSIS — F5104 Psychophysiologic insomnia: Secondary | ICD-10-CM

## 2024-01-02 ENCOUNTER — Ambulatory Visit: Payer: Self-pay

## 2024-01-02 NOTE — Telephone Encounter (Signed)
 FYI Only or Action Required?: FYI only for provider: 911 for EMS evaluation .  Patient was last seen in primary care on 07/02/2023 by Corwin Antu, FNP.  Called Nurse Triage reporting Hypotension.  Symptoms began today.  Interventions attempted: Rest, hydration, or home remedies.  Symptoms are: gradually worsening.  Triage Disposition: Call EMS 911 Now  Patient/caregiver understands and will follow disposition?: Yes  Copied from CRM [id number]. Topic: Clinical - Red Word Triage >> Jan 02, 2024  2:49 PM Sasha M wrote: Red Word that prompted transfer to Nurse Triage: pt has an almost fainting spell  after going out to eat and husband has been trying to check BP but machine is reading an error and when he checks his its fine Reason for Disposition  [1] Systolic BP < 90 AND [2] feeling weak or lightheaded (e.g., woozy, feeling like they might faint)  Answer Assessment - Initial Assessment Questions Just walked in the back door from going out to lunch. Stopped and slouched in doorway. Felt like she was going to pass out- got to a chair. Got to the BP cuff. Reading error twice. Husband checked his and it read normal. Attempted to check in opposite arm and still reading error. Their machine does have a light that blinks for HR but doesn't give a reading and husband reports that it looked to be beating fast. Unable to find pulse but husband has never palpated pulses before.   On speaker phone, patient is alert and oriented. She reports feeling very light headed, generalized weakness, and doesn't feel like she can stand up. Husband took temp 97.7- pt reports feeling warmer then that but unsure. Felt a little funny this morning- Denies vision changes, balance is off due to light headed. Denies unilateral weakness. Patient stuck her tongue out for husband and does not report deviation, if it is, might be slightly to the right.  PAS stayed on the line with RN and called 911 to have EMS evaluate patient  and obtain manual BP, and rule out stroke. Husband aware to call back after evaluation and if seen in the ED. RN and PAS stayed on with pt and dispatch until Fire and EMS arrived to evaluated pt.   1. BLOOD PRESSURE: What is your blood pressure? Did you take at least two measurements 5 minutes apart?     Blood pressure runs low  2. ONSET: When did you take your blood pressure?     30-18min ago 3. HOW: How did you take your blood pressure? (e.g., visiting nurse, automatic home BP monitor)     Tried both arms with automatic cuff- reading error 4. HISTORY: Do you have a history of low blood pressure? What is your blood pressure normally?     Normally 90's/40's but it wont read now 5. MEDICINES: Are you taking any medicines for blood pressure? If Yes, ask: Have they been changed recently?     Sinus headache and inner ear issues-prednisone  course that ended 2 days ago 6. PULSE RATE: Do you know what your pulse rate is?      BP machine didn't give number but the bpm looked faster then normal 7. OTHER SYMPTOMS: Have you been sick recently? Have you had a recent injury?     Right ear pain evaluated in ED 10/23.  Protocols used: Blood Pressure - Low-A-AH

## 2024-01-02 NOTE — Telephone Encounter (Signed)
 NOTED

## 2024-01-03 ENCOUNTER — Encounter: Payer: Self-pay | Admitting: Family Medicine

## 2024-01-03 ENCOUNTER — Ambulatory Visit (INDEPENDENT_AMBULATORY_CARE_PROVIDER_SITE_OTHER): Admitting: Family Medicine

## 2024-01-03 ENCOUNTER — Ambulatory Visit: Payer: Self-pay

## 2024-01-03 VITALS — BP 100/66 | HR 100 | Ht 64.0 in | Wt 165.5 lb

## 2024-01-03 DIAGNOSIS — H6506 Acute serous otitis media, recurrent, bilateral: Secondary | ICD-10-CM | POA: Diagnosis not present

## 2024-01-03 DIAGNOSIS — I951 Orthostatic hypotension: Secondary | ICD-10-CM | POA: Diagnosis not present

## 2024-01-03 DIAGNOSIS — H6991 Unspecified Eustachian tube disorder, right ear: Secondary | ICD-10-CM | POA: Diagnosis not present

## 2024-01-03 DIAGNOSIS — R42 Dizziness and giddiness: Secondary | ICD-10-CM | POA: Diagnosis not present

## 2024-01-03 MED ORDER — AMOXICILLIN-POT CLAVULANATE 875-125 MG PO TABS: 1.0000 | ORAL_TABLET | Freq: Two times a day (BID) | ORAL | 0 refills | Status: DC

## 2024-01-03 MED ORDER — AZELASTINE HCL 0.1 % NA SOLN: 2.0000 | Freq: Two times a day (BID) | NASAL | 0 refills | Status: AC

## 2024-01-03 NOTE — Progress Notes (Signed)
 Acute Office Visit  Introduced to nurse practitioner role and practice setting.  All questions answered.  Discussed provider/patient relationship and expectations.   Subjective:     Patient ID: Frances Hoffman, female    DOB: Jul 05, 1940, 83 y.o.   MRN: [id number]  Chief Complaint  Patient presents with   Acute Visit    intermittent dizziness, ear congestion/Rn Triage yesterday and today. Patient spouse called said that she's been experiencing some dizziness. Stated he is concerned that his blood pressure machine is not working for her, EMS came yesterday and was able to check it and it was normal. They reported intermittent lightheadedness, somewhat weak when walking, and no vertigo. Had some difficulty walking yesterday but better today. Seen at [facility] last week for fluid in ear, rx prednisone  finished no improvement    Dizziness    97/57 bp when EMS came yesterday. Fell last night and hit her head due to feeling off balance, fell backwards early today and hit buttock. Patient lost balance early when attempting to get on the scale but caught her self. Took meclizine  yesterday   Discussed the use of AI scribe software for clinical note transcription with the patient, who gave verbal consent to proceed.  History of Present Illness Frances Hoffman is an 83 year old female with a history of orthostatic hypotension who presents with chronic dizziness and ear fullness. She is accompanied by her husband, who appears to be more of the main historian. Pt has hx of memory deficit at baseline.  She has been experiencing chronic dizziness and a sensation of ear fullness for nearly ten years. The ear fullness feels like 'there's water behind it.' Despite seeing an ENT specialist in July, no definitive cause was identified. An MRI is scheduled for next Tuesday to further investigate these symptoms. She is concerned today because she continues to have R ear pain, after urgent care visit on 12/26/23 where she  was prescribed five day course of steroids. She continues to have ear pain today, concerns for an infection.  She also bring up chronic dizziness is characterized as lightheadedness, particularly when standing up, rather than a spinning sensation. She has a history of orthostatic hypotension, although she does not recall a formal diagnosis. Her blood pressure typically runs low, around 100/66, and she reports experiencing significant drops upon standing. She has not seen a cardiologist recently but has a history of past consultations. Husband was concerned because when she was having one of these dizzy episodes, he was unable to get her blood pressure with their at home cuff, but the cuff was able to read his blood pressure.   States with this lightheadedness/dizziness, She has experienced falls, including one last night in the kitchen where she had gotten up and just fell, though she did not lose consciousness or sustained an injury. She's kind of hit head, but not on blood thinners. These falls are not due to tripping or mechanical issues but she states they occur suddenly without a clear cause.  She has been prescribed meclizine  in the past for dizziness, taking a high dose of 50 mg. Not currently on any blood pressure medication. She has also used Flonase  for her ear symptoms but not consistently.  Her husband notes no recent changes in her hearing, stating she hears 'better than he does.' She has a history of using Q-tips. No recent changes in hearing, and she denies passing out, fever, or head injury from recent falls. She reports feeling hot at  times but not running a fever. The hot feeling coincides with the lightheadedness.  HPI  ROS      Objective:    BP 100/66 (BP Location: Left Arm, Patient Position: Sitting, Cuff Size: Large)   Pulse 100   Ht 5' 4 (1.626 m)   Wt 165 lb 8 oz (75.1 kg)   SpO2 96%   BMI 28.41 kg/m    Physical Exam Constitutional:      General: She is not in  acute distress.    Appearance: Normal appearance. She is not ill-appearing, toxic-appearing or diaphoretic.  HENT:     Head: Normocephalic.     Right Ear: Hearing and external ear normal. No decreased hearing noted. A middle ear effusion is present. Tympanic membrane is injected and scarred. Tympanic membrane is not bulging.     Left Ear: Hearing and external ear normal. No decreased hearing noted. A middle ear effusion is present. Tympanic membrane is injected and scarred. Tympanic membrane is not bulging.     Nose: Nose normal.     Right Turbinates: Not enlarged or swollen.     Left Turbinates: Not enlarged or swollen.     Right Sinus: No maxillary sinus tenderness or frontal sinus tenderness.     Left Sinus: No maxillary sinus tenderness or frontal sinus tenderness.     Mouth/Throat:     Mouth: Mucous membranes are moist.     Pharynx: Oropharynx is clear.  Eyes:     Extraocular Movements: Extraocular movements intact.     Pupils: Pupils are equal, round, and reactive to light.  Cardiovascular:     Rate and Rhythm: Normal rate and regular rhythm.     Pulses: Normal pulses.          Radial pulses are 2+ on the right side and 2+ on the left side.       Dorsalis pedis pulses are 2+ on the right side and 2+ on the left side.       Posterior tibial pulses are 2+ on the right side and 2+ on the left side.     Heart sounds: Normal heart sounds. No murmur heard.    No friction rub. No gallop.  Pulmonary:     Effort: No respiratory distress.     Breath sounds: No stridor. No wheezing, rhonchi or rales.  Chest:     Chest wall: No tenderness.  Musculoskeletal:     Right lower leg: No edema.     Left lower leg: No edema.  Skin:    General: Skin is warm and dry.     Capillary Refill: Capillary refill takes less than 2 seconds.  Neurological:     General: No focal deficit present.     Mental Status: She is alert and oriented to person, place, and time. Mental status is at baseline.      Cranial Nerves: No cranial nerve deficit.     Sensory: No sensory deficit.     Motor: No weakness.     Coordination: Coordination normal.     Gait: Gait normal.  Psychiatric:        Attention and Perception: Attention and perception normal.        Mood and Affect: Mood normal.        Behavior: Behavior normal.        Thought Content: Thought content normal.        Judgment: Judgment normal.     No results found for any visits on 01/03/24.  No data found.      Assessment & Plan:  Assessment and Plan Assessment & Plan Chronic dizziness  - Cardiac vs ENT vs Neuro vs BPPV - The symptoms she is having are described as lightheadedness, she feels hot, denies spinning sensation. She also has fallen randomly. No associated with mechanical fall or tripping over things. States last night she went to stand in kitchen and just fall. Denies as injuries, states she may had bumped head. She is neurologically at her baseline. No injury to head via physical exam.  - Husband states can't get her blood pressure with at home cuff, will read error  - Had EMS come out, BP was at her norm low 100s/60s - Per chart review she has diagnosis of orthostatic hypotension in 2017. Her husbands states she had seen cardiology in the past but it has been many years.  Today's orthostatic blood pressures were positive, with SBP drop in 33 points, from 109 to 76 - concerning.  - She is/was on Florinef  for orthostatic blood pressure, but seems not to be in control. History taking not the based given memory concerns. - Heart sounds and rate normal today during exam - No recent cardiology follow-up. No history of syncope, but recent falls without clear cause. Not tripping, not passing out or losing consciousness. - Chronic ear issues could be contributing to dizziness or worsening.  - had brain MRI 02/2023 - no acute findings, atrophy present based on age, with micro ischemic changes - has MRI scheduled on Tuesday  01/07/24 for chronic ear fullness orders by ENT - Referred to cardiology for further evaluation of orthostatic hypotension - may need heart monitor. Was Seeing Dr. Alveta, has not seen since March 2025. Recommend scheduling. Will put in referral if needed. - Was told to increase sodium intake, and water intake to reduce risk of dehydration and low blood pressure - Advised to ensure adequate hydration to prevent low blood pressure. - Advise being aware of positioning, slow to standing, march in place, do heel raises in place.  - Continue follow-up with ENT for MRI scheduled next Tuesday.  Recurrent serous otitis media  - Symptoms include ear fullness and dizziness. Recent steroid pack did not alleviate symptoms. ENT has ordered MRI to assess ears and sinuses. - on exam scarring present on bilateral Tms, mid ear effusion present and injection.  - Prescribed Augmentin  for recurrent otitis media - Continue with MRI scheduled next Tuesday as ordered by ENT.  Eustachian tube dysfunction, right ear - ear fullness and dizziness.  - Mid ear effusion present on exam - No current nasal spray use, but has used Flonase  in the past. - Will trial azelastine nasal spray to help with chronic mid ear effusion and ear fullness  Problem List Items Addressed This Visit       Cardiovascular and Mediastinum   Orthostatic hypotension   Relevant Orders   Ambulatory referral to Cardiology     Nervous and Auditory   Eustachian tube dysfunction, right   Relevant Medications   azelastine (ASTELIN) 0.1 % nasal spray     Other   Dizziness - Primary   Other Visit Diagnoses       Recurrent acute serous otitis media of both ears       Relevant Medications   amoxicillin -clavulanate (AUGMENTIN ) 875-125 MG tablet   azelastine (ASTELIN) 0.1 % nasal spray       Meds ordered this encounter  Medications   amoxicillin -clavulanate (AUGMENTIN ) 875-125 MG tablet  Sig: Take 1 tablet by mouth 2 (two) times daily.     Dispense:  14 tablet    Refill:  0   azelastine (ASTELIN) 0.1 % nasal spray    Sig: Place 2 sprays into both nostrils 2 (two) times daily. Use in each nostril as directed    Dispense:  30 mL    Refill:  0    Return in about 2 weeks (around 01/17/2024), or if symptoms worsen or fail to improve, for Close follow up with PCP .  Curtis DELENA Boom, FNP  I, Curtis DELENA Boom, FNP, have reviewed all documentation for this visit. The documentation on 01/06/24 for the exam, diagnosis, procedures, and orders are all accurate and complete.

## 2024-01-03 NOTE — Telephone Encounter (Signed)
 NOTED  Thank you so much for seeing her [name].

## 2024-01-03 NOTE — Telephone Encounter (Signed)
 FYI Only or Action Required?: FYI only for provider: appointment scheduled on 01/03/24.  Patient was last seen in primary care on 07/02/2023 by Corwin Antu, FNP.  Called Nurse Triage reporting Dizziness.  Symptoms began several days ago.  Interventions attempted: Rest, hydration, or home remedies.  Symptoms are: gradually improving.  Triage Disposition: See Physician Within 24 Hours  Patient/caregiver understands and will follow disposition?: Yes  Copied from CRM [id number]. Topic: Clinical - Red Word Triage >> Jan 03, 2024  9:55 AM Macario HERO wrote: Red Word that prompted transfer to Nurse Triage: Patient spouse called said that she's been experiencing some dizziness. Stated he is concerned that his blood pressure machine is not working for her, EMS came yesterday and was able to check it and it was normal. Reason for Disposition  [1] MODERATE dizziness (e.g., interferes with normal activities) AND [2] has NOT been evaluated by doctor (or NP/PA) for this  (Exception: Dizziness caused by heat exposure, sudden standing, or poor fluid intake.)  Answer Assessment - Initial Assessment Questions Additional info: 1) Blood pressure machine broken, called ems yesterday they checked her BP and reported to be normal.  2) Scheduled acute visit today at alternate office, aware of clinic location and name of provider.   1. DESCRIPTION: Describe your dizziness.     Intermittent lightheadedness  2. LIGHTHEADED: Do you feel lightheaded? (e.g., somewhat faint, woozy, weak upon standing)     Somewhat weak when walking  3. VERTIGO: Do you feel like either you or the room is spinning or tilting? (i.e., vertigo)     no 4. SEVERITY: How bad is it?  Do you feel like you are going to faint? Can you stand and walk?     Able to walk around, had some difficulty yesterday but improving today.  5. ONSET:  When did the dizziness begin?     Yesterday  6. AGGRAVATING FACTORS: Does anything make it  worse? (e.g., standing, change in head position)     Unidentified  7. HEART RATE: Can you tell me your heart rate? How many beats in 15 seconds?  (Note: Not all patients can do this.)       No change 8. CAUSE: What do you think is causing the dizziness? (e.g., decreased fluids or food, diarrhea, emotional distress, heat exposure, new medicine, sudden standing, vomiting; unknown)     Unsure-possible ear issue 9. RECURRENT SYMPTOM: Have you had dizziness before? If Yes, ask: When was the last time? What happened that time?      10. OTHER SYMPTOMS: Do you have any other symptoms? (e.g., fever, chest pain, vomiting, diarrhea, bleeding)       Urgent care last week for fluid in ear, finished prednisone  but no improvement to symptoms.  Protocols used: Dizziness - Lightheadedness-A-AH

## 2024-01-06 ENCOUNTER — Encounter: Payer: Self-pay | Admitting: Family Medicine

## 2024-01-07 DIAGNOSIS — H9201 Otalgia, right ear: Secondary | ICD-10-CM | POA: Diagnosis not present

## 2024-01-13 NOTE — Telephone Encounter (Unsigned)
 Copied from CRM [id number]. Topic: Clinical - Medication Question >> Jan 13, 2024  9:35 AM Burnard DEL wrote: Reason for CRM: Patient called in stating that traZODone  (DESYREL ) 50 MG tablet is not helping her sleep. She would like to know if she could be prescribed a different medication to help her sleep?   [facility] - [place], [place] - [address]  Phone: [phone] Fax: [phone]

## 2024-01-15 ENCOUNTER — Ambulatory Visit
Admission: RE | Admit: 2024-01-15 | Discharge: 2024-01-15 | Disposition: A | Discharge status: Home / Self Care | Source: Ambulatory Visit | Attending: Family | Admitting: Family

## 2024-01-15 ENCOUNTER — Encounter: Payer: Self-pay | Admitting: Family

## 2024-01-15 ENCOUNTER — Ambulatory Visit (INDEPENDENT_AMBULATORY_CARE_PROVIDER_SITE_OTHER): Admitting: Family

## 2024-01-15 VITALS — BP 98/62 | HR 84 | Temp 98.4°F | Wt 165.4 lb

## 2024-01-15 DIAGNOSIS — S0990XA Unspecified injury of head, initial encounter: Secondary | ICD-10-CM | POA: Diagnosis not present

## 2024-01-15 DIAGNOSIS — E039 Hypothyroidism, unspecified: Secondary | ICD-10-CM

## 2024-01-15 DIAGNOSIS — R42 Dizziness and giddiness: Secondary | ICD-10-CM | POA: Diagnosis not present

## 2024-01-15 DIAGNOSIS — E531 Pyridoxine deficiency: Secondary | ICD-10-CM

## 2024-01-15 DIAGNOSIS — I6782 Cerebral ischemia: Secondary | ICD-10-CM | POA: Diagnosis not present

## 2024-01-15 DIAGNOSIS — R350 Frequency of micturition: Secondary | ICD-10-CM

## 2024-01-15 DIAGNOSIS — R531 Weakness: Secondary | ICD-10-CM | POA: Diagnosis not present

## 2024-01-15 DIAGNOSIS — E538 Deficiency of other specified B group vitamins: Secondary | ICD-10-CM | POA: Diagnosis not present

## 2024-01-15 LAB — POCT URINALYSIS DIP (CLINITEK)
Bilirubin, UA: NEGATIVE
Blood, UA: NEGATIVE
Glucose, UA: NEGATIVE mg/dL
Ketones, POC UA: NEGATIVE mg/dL
Leukocytes, UA: NEGATIVE
Nitrite, UA: NEGATIVE
POC PROTEIN,UA: NEGATIVE
Spec Grav, UA: 1.01 (ref 1.010–1.025)
Urobilinogen, UA: 0.2 U/dL
pH, UA: 8 (ref 5.0–8.0)

## 2024-01-15 NOTE — Progress Notes (Signed)
 Established Patient Office Visit  Subjective:      CC:  Chief Complaint  Patient presents with   Acute Visit    Reports having issues with her mouth. She is having a stinging/burning sensation. This has been present for years.    HPI: Frances Hoffman is a 83 y.o. female presenting on 01/15/2024 for Acute Visit (Reports having issues with her mouth. She is having a stinging/burning sensation. This has been present for years.) .  Discussed the use of AI scribe software for clinical note transcription with the patient, who gave verbal consent to proceed.  History of Present Illness Frances Hoffman is an 83 year old female who presents with a stinging and burning sensation in the mouth.  She experiences a stinging and burning sensation in her mouth, particularly when consuming beverages like Cokes. These symptoms have persisted for a while. She has not been taking gabapentin , which was previously recommended for her burning mouth syndrome.  She has a history of chronic dizziness and ear fullness. Recently, she has experienced episodes of weakness in her knees, leading to falls. One fall occurred when she got up at night to use the restroom, and another happened while standing in the kitchen. She hit her head during these falls but did not sustain any cuts or bleeding. Dizziness and weakness, particularly in her knees, continue to be issues.  She has a history of low vitamin B6, identified in June, and has been taking a multivitamin. She has a history of elevated rheumatoid factor. She has low blood pressure and is taking Florinef  regularly. No burning during urination or increased frequency. She reports blurry vision and a sensation of moisture in her eyes. No confusion beyond her usual state. She reports weakness in both legs but denies any foot dragging or unilateral weakness.         Social history:  Relevant past medical, surgical, family and social history reviewed and  updated as indicated. Interim medical history since our last visit reviewed.  Allergies and medications reviewed and updated.  DATA REVIEWED: CHART IN EPIC     ROS: Negative unless specifically indicated above in HPI.    Current Outpatient Medications:    acetaminophen  (TYLENOL ) 500 MG tablet, Take 500 mg by mouth every 6 (six) hours as needed., Disp: , Rfl:    aspirin  EC 81 MG tablet, Take 81 mg by mouth daily. Swallow whole., Disp: , Rfl:    atorvastatin  (LIPITOR) 80 MG tablet, Take 1 tablet (80 mg total) by mouth daily., Disp: 90 tablet, Rfl: 3   azelastine (ASTELIN) 0.1 % nasal spray, Place 2 sprays into both nostrils 2 (two) times daily. Use in each nostril as directed, Disp: 30 mL, Rfl: 0   calcium  carbonate (OS-CAL - DOSED IN MG OF ELEMENTAL CALCIUM ) 1250 (500 CA) MG tablet, Take 1 tablet by mouth 2 (two) times daily with a meal., Disp: , Rfl:    carboxymethylcellulose (REFRESH PLUS) 0.5 % SOLN, Place 1 drop into both eyes 3 (three) times daily as needed (for dryness)., Disp: , Rfl:    Cholecalciferol  (VITAMIN D3) 2000 UNITS capsule, Take 2,000 Units by mouth daily., Disp: , Rfl:    COLESTID 1 g tablet, Take 2 g by mouth 2 (two) times daily. For 30 days, Disp: , Rfl:    escitalopram  (LEXAPRO ) 20 MG tablet, Take 1 tablet (20 mg total) by mouth daily., Disp: 90 tablet, Rfl: 3   fludrocortisone  (FLORINEF ) 0.1 MG tablet, Take 1 tablet by  mouth once daily, Disp: 30 tablet, Rfl: 10   gabapentin  (NEURONTIN ) 300 MG capsule, Start one tablet at night since this can cause sleepiness. Can increase to one tablet three times daily., Disp: 90 capsule, Rfl: 1   levothyroxine  (SYNTHROID ) 88 MCG tablet, Take 1 tablet by mouth once daily, Disp: 90 tablet, Rfl: 0   loperamide (IMODIUM A-D) 2 MG capsule, Take 2 mg by mouth daily as needed., Disp: , Rfl:    meclizine  (ANTIVERT ) 50 MG tablet, Take 1 tablet (50 mg total) by mouth 3 (three) times daily as needed., Disp: 30 tablet, Rfl: 0   memantine   (NAMENDA ) 10 MG tablet, Take 1 tablet (10 mg total) by mouth 2 (two) times daily., Disp: 180 tablet, Rfl: 3   metFORMIN  (GLUCOPHAGE ) 500 MG tablet, Take 1 tablet (500 mg total) by mouth 2 (two) times daily with a meal., Disp: 180 tablet, Rfl: 0   pantoprazole  (PROTONIX ) 20 MG tablet, Take 1 tablet by mouth once daily, Disp: 30 tablet, Rfl: 5   traZODone  (DESYREL ) 50 MG tablet, TAKE 1/2 (ONE-HALF) TABLET BY MOUTH AT BEDTIME, Disp: 90 tablet, Rfl: 3        Objective:        BP 98/62 (BP Location: Left Arm, Patient Position: Sitting, Cuff Size: Large)   Pulse 84   Temp 98.4 F (36.9 C) (Temporal)   Wt 165 lb 6.4 oz (75 kg)   SpO2 98%   BMI 28.39 kg/m   Physical Exam   Wt Readings from Last 3 Encounters:  01/15/24 165 lb 6.4 oz (75 kg)  01/03/24 165 lb 8 oz (75.1 kg)  10/08/23 161 lb 6.4 oz (73.2 kg)    Physical Exam Vitals reviewed.  Constitutional:      General: She is not in acute distress.    Appearance: Normal appearance. She is normal weight. She is not ill-appearing, toxic-appearing or diaphoretic.  HENT:     Right Ear: Tympanic membrane, ear canal and external ear normal.     Left Ear: Tympanic membrane, ear canal and external ear normal.     Mouth/Throat:     Mouth: Mucous membranes are moist.     Tongue: No lesions. Tongue does not deviate from midline.  Eyes:     General: No visual field deficit (slight). Cardiovascular:     Rate and Rhythm: Normal rate.  Pulmonary:     Effort: Pulmonary effort is normal.  Abdominal:     General: Abdomen is flat.     Tenderness: There is no abdominal tenderness. There is no right CVA tenderness or left CVA tenderness.  Neurological:     General: No focal deficit present.     Mental Status: She is alert and oriented to person, place, and time. Mental status is at baseline.     Cranial Nerves: Cranial nerves 2-12 are intact. No cranial nerve deficit or facial asymmetry.     Sensory: No sensory deficit.     Motor: No  weakness, abnormal muscle tone or pronator drift.     Coordination: Romberg sign negative. Finger-Nose-Finger Test and Heel to [facility] Test normal.     Gait: Gait abnormal.  Psychiatric:        Mood and Affect: Mood normal.        Behavior: Behavior normal.        Thought Content: Thought content normal.        Judgment: Judgment normal.          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 Previous workup in June showed low vitamin B6 and B12 levels. Gabapentin  was recommended by ENT but not yet started. Multivitamin use may help elevate vitamin levels. Autoimmune disease could contribute to symptoms. - Will recheck vitamin B6 and B12 levels. - Start gabapentin  as recommended by ENT. - Continue multivitamin use. - Follow up with rheumatologist on December 9th.  Chronic dizziness and unsteadiness with recent head trauma and weakness Chronic dizziness and unsteadiness with recent head trauma and weakness. Recent falls with head impact. No acute findings on previous brain MRI. Low blood pressure and use of fludrocortisone  may contribute to symptoms. Gabapentin  may cause drowsiness, increasing fall risk. - Ordered CT head without contrast to rule out acute concerns such as hemorrhage. - Checked labs for anemia. - Encouraged use of walker or cane to prevent falls. - Monitor for drowsiness with gabapentin  use. -er precautions advised  Right ear pain (referred otalgia) Right ear pain likely referred from TMJ. Previous ENT evaluation showed normal ear exam. MRI was discussed but not scheduled per pt. F/u with ENT prn  Dry mouth (xerostomia) Chronic dry mouth contributing to  burning mouth syndrome. Multivitamin use may help alleviate symptoms. - Continue multivitamin use.   Frequency of urination Increased frequency of urination, possibly related to UTI. UTIs can present faintly in older adults, contributing to weakness. - Attempt to obtain urine sample for analysis.        Return if symptoms worsen or fail to improve.     Ginger Patrick, MSN, APRN, FNP-C [facility]

## 2024-01-16 ENCOUNTER — Other Ambulatory Visit: Payer: Self-pay | Admitting: Neurology

## 2024-01-16 ENCOUNTER — Ambulatory Visit: Payer: Self-pay | Admitting: Family

## 2024-01-16 LAB — CBC WITH DIFFERENTIAL/PLATELET
Basophils Absolute: 0.1 K/uL (ref 0.0–0.1)
Basophils Relative: 1.2 % (ref 0.0–3.0)
Eosinophils Absolute: 0.1 K/uL (ref 0.0–0.7)
Eosinophils Relative: 1.8 % (ref 0.0–5.0)
HCT: 39.3 % (ref 36.0–46.0)
Hemoglobin: 13.5 g/dL (ref 12.0–15.0)
Lymphocytes Relative: 33.5 % (ref 12.0–46.0)
Lymphs Abs: 2.5 K/uL (ref 0.7–4.0)
MCHC: 34.4 g/dL (ref 30.0–36.0)
MCV: 92.2 fl (ref 78.0–100.0)
Monocytes Absolute: 0.6 K/uL (ref 0.1–1.0)
Monocytes Relative: 8 % (ref 3.0–12.0)
Neutro Abs: 4.1 K/uL (ref 1.4–7.7)
Neutrophils Relative %: 55.5 % (ref 43.0–77.0)
Platelets: 237 K/uL (ref 150.0–400.0)
RBC: 4.27 Mil/uL (ref 3.87–5.11)
RDW: 14.2 % (ref 11.5–15.5)
WBC: 7.3 K/uL (ref 4.0–10.5)

## 2024-01-16 LAB — B12 AND FOLATE PANEL
Folate: 18.1 ng/mL (ref >5.9)
Vitamin B-12: 521 pg/mL (ref 211–911)

## 2024-01-16 LAB — IBC + FERRITIN
Ferritin: 75.4 ng/mL (ref 10.0–291.0)
Iron: 67 ug/dL (ref 42–145)
Saturation Ratios: 21.1 % (ref 20.0–50.0)
TIBC: 317.8 ug/dL (ref 250.0–450.0)
Transferrin: 227 mg/dL (ref 212.0–360.0)

## 2024-01-16 LAB — BASIC METABOLIC PANEL WITH GFR
BUN: 12 mg/dL (ref 6–23)
CO2: 34 meq/L — ABNORMAL HIGH (ref 19–32)
Calcium: 9.5 mg/dL (ref 8.4–10.5)
Chloride: 100 meq/L (ref 96–112)
Creatinine, Ser: 1.14 mg/dL (ref 0.40–1.20)
GFR: 44.58 mL/min — ABNORMAL LOW (ref >60.00)
Glucose, Bld: 103 mg/dL — ABNORMAL HIGH (ref 70–99)
Potassium: 3.7 meq/L (ref 3.5–5.1)
Sodium: 143 meq/L (ref 135–145)

## 2024-01-16 LAB — TSH: TSH: 3.23 u[IU]/mL (ref 0.35–5.50)

## 2024-01-16 NOTE — Progress Notes (Signed)
  Cardiology Office Note   Date:  01/20/2024  ID:  Frances Hoffman, DOB May 22, 1940, MRN [id number] PCP: Corwin Antu, FNP  [facility] Providers Cardiologist:  Caron Poser, MD     History of Present Illness Frances Hoffman is a 83 y.o. female PMH DM2, HLD who presents for further evaluation management of orthostatic hypotension.  Patient reports chronic dizziness.  She denies any syncope.  She reports that she has been to an ENT which did not find any inner ear issues.  She says it mainly feels like lightheadedness and not necessarily vertigo.  Relevant CVD History -TTE 02/2023 normal biventricular function without significant valvular disease   ROS: Pt denies any chest discomfort, jaw pain, arm pain, palpitations, syncope, presyncope, orthopnea, PND, or LE edema.  Studies Reviewed I have independently reviewed the patient's ECG, previous medical records.  Physical Exam VS:  BP (!) 96/58 (BP Location: Left Arm, Patient Position: Sitting, Cuff Size: Normal)   Pulse 68   Ht 5' 4 (1.626 m)   Wt 166 lb (75.3 kg)   SpO2 98%   BMI 28.49 kg/m   Orthostatic VS for the past 24 hrs (Last 3 readings):  BP- Lying Pulse- Lying BP- Sitting Pulse- Sitting BP- Standing at 0 minutes Pulse- Standing at 0 minutes BP- Standing at 3 minutes Pulse- Standing at 3 minutes  01/20/24 1142 105/67 66 102/64 67 101/65 76 99/64 84      Wt Readings from Last 3 Encounters:  01/20/24 166 lb (75.3 kg)  01/15/24 165 lb 6.4 oz (75 kg)  01/03/24 165 lb 8 oz (75.1 kg)    GEN: No acute distress. NECK: No JVD; No carotid bruits. CARDIAC: RRR, no murmurs, rubs, gallops. RESPIRATORY:  Clear to auscultation. EXTREMITIES:  Warm and well-perfused. No edema.  ASSESSMENT AND PLAN Orthostatic hypotension Dizziness Patient presents for further evaluation of dizziness and concerns for orthostatic hypotension.  We performed orthostatic vital signs today in office which were essentially normal.  She has  had a normal echocardiogram recently ruling out structural heart disease as a cause.  She denies any palpitations, so think arrhythmia is less likely.  She does have quite a long medication list for her age, so my suspicion would be either polypharmacy or autonomic insufficiency due to dementia.  Plan: - Will obtain monitor to rule out arrhythmogenic causes - Consider stopping trazodone  as orthostasis is one of the main side effects - Recommend medication reconciliation and minimize her polypharmacy burden - If the above workup is negative and this seems like autonomic insufficiency due to underlying dementia, then could consider starting her on midodrine.  Would recommend the above nonpharmacologic measures first, however.         Dispo: RTC as needed  Signed, Caron Poser, MD

## 2024-01-17 ENCOUNTER — Telehealth: Payer: Self-pay | Admitting: Family

## 2024-01-17 NOTE — Telephone Encounter (Unsigned)
 Copied from CRM #[id number]. Topic: Clinical - Medication Refill >> Jan 17, 2024  8:58 AM [name] wrote: Medication: gabapentin  (NEURONTIN ) 300 MG capsule  Has the patient contacted their pharmacy? Yes (Agent: If no, request that the patient contact the pharmacy for the refill. If patient does not wish to contact the pharmacy document the reason why and proceed with request.) (Agent: If yes, when and what did the pharmacy advise?)  This is the patient's preferred pharmacy:  [facility] [address] [place], [place] - [address] [place] [address] Phone: [phone] Fax: [phone]  Is this the correct pharmacy for this prescription? Yes If no, delete pharmacy and type the correct one.   Has the prescription been filled recently? Yes  Is the patient out of the medication? Yes  Has the patient been seen for an appointment in the last year OR does the patient have an upcoming appointment? Yes  Can we respond through MyChart? Yes  Agent: Please be advised that Rx refills may take up to 3 business days. We ask that you follow-up with your pharmacy.

## 2024-01-17 NOTE — Telephone Encounter (Signed)
 Phone call placed to patient. Patient was at the pharmacy to pick up her gabapentin  prescription. Patient is instructed for any refill inquiries should go through her allergy doctor as he had prescribed the medication for her. Patient verbalized understanding and all questions answered.

## 2024-01-18 LAB — VITAMIN B6: Vitamin B6: 9.5 ng/mL (ref 2.1–21.7)

## 2024-01-20 ENCOUNTER — Ambulatory Visit

## 2024-01-20 ENCOUNTER — Telehealth: Payer: Self-pay | Admitting: Family

## 2024-01-20 ENCOUNTER — Ambulatory Visit: Payer: Self-pay

## 2024-01-20 VITALS — BP 96/58 | HR 68 | Ht 64.0 in | Wt 166.0 lb

## 2024-01-20 DIAGNOSIS — R42 Dizziness and giddiness: Secondary | ICD-10-CM

## 2024-01-20 DIAGNOSIS — I951 Orthostatic hypotension: Secondary | ICD-10-CM | POA: Diagnosis not present

## 2024-01-20 NOTE — Patient Instructions (Signed)
 Medication Instructions:  Your physician recommends the following medication changes.  STOP TAKING: Trazadone (Desyrel ) 50 mg    *If you need a refill on your cardiac medications before your next appointment, please call your pharmacy*  Lab Work: No labs ordered today  If you have labs (blood work) drawn today and your tests are completely normal, you will receive your results only by: MyChart Message (if you have MyChart) OR A paper copy in the mail If you have any lab test that is abnormal or we need to change your treatment, we will call you to review the results.      Testing/Procedures: Frances Hoffman- Long Term Monitor Instructions  Your physician has requested you wear a ZIO patch monitor for 14 days.  This is a single patch monitor. [employer] supplies one patch monitor per enrollment. Additional stickers are not available. Please do not apply patch if you will be having a Nuclear Stress Test, Echocardiogram, Cardiac CT, MRI, or Chest Xray during the period you would be wearing the monitor. The patch cannot be worn during these tests. You cannot remove and re-apply the ZIO XT patch monitor.  Your ZIO patch monitor will be mailed 3 day USPS to your address on file. It may take 3-5 days to receive your monitor after you have been enrolled. Once you have received your monitor, please review the enclosed instructions. Your monitor has already been registered assigning a specific monitor serial number to you.  Billing and Patient Assistance Program Information  We have supplied [employer] with any of your insurance information on file for billing purposes.  [employer] offers a sliding scale Patient Assistance Program for patients that do not have insurance, or whose insurance does not completely cover the cost of the ZIO monitor.  You must apply for the Patient Assistance Program to qualify for this discounted rate.  To apply, please call [employer] at [phone], select option 4, select option 2, ask  to apply for Patient Assistance Program. Frances Hoffman will ask your household income, and how many people are in your household. They will quote your out-of-pocket cost based on that information. [employer] will also be able to set up a 69-month, interest-free payment plan if needed.  Applying the monitor   Shave hair from upper left chest.  Hold abrader disc by orange tab. Rub abrader in 40 strokes over the upper left chest as indicated in your monitor instructions.  Clean area with 4 enclosed alcohol  pads. Let dry.  Apply patch as indicated in monitor instructions. Patch will be placed under collarbone on left side of chest with arrow pointing upward.  Rub patch adhesive wings for 2 minutes. Remove white label marked 1. Remove the white label marked 2. Rub patch adhesive wings for 2 additional minutes.  While looking in a mirror, press and release button in center of patch. A small green light will flash 3-4 times. This will be your only indicator that the monitor has been turned on.   After Applying Monitor: Do not shower for the first 24 hours. You may shower after the first 24 hours.  Press the button if you feel a symptom. You will hear a small click. Record Date, Time and Symptom in the Patient Logbook.   After Completing 14 Days: When you are ready to remove the patch, follow instructions on the last 2 pages of Patient Logbook.  Stick patch monitor into the tabs at the bottom of the return box.  Place Patient Logbook in the blue and  white box. Use locking tab on box and tape box closed securely. The blue and white box has prepaid postage on it. Please place it in the mailbox as soon as possible. Your physician should have your test results approximately 7-14 days after the monitor has been mailed back to [facility].   Troubleshooting: Call [facility] at [phone] if you have questions regarding your ZIO XT patch monitor.  Call them immediately if you see an orange  light blinking on your monitor.  If your monitor falls off in less than 4 days, contact our Monitor department at [phone].  If your monitor becomes loose or falls off after 4 days call [employer] at [phone] for suggestions on securing your monitor.   Follow-Up: At [facility], you and your health needs are our priority.  As part of our continuing mission to provide you with exceptional heart care, our providers are all part of one team.  This team includes your primary Cardiologist (physician) and Advanced Practice Providers or APPs (Physician Assistants and Nurse Practitioners) who all work together to provide you with the care you need, when you need it.  Your next appointment:   As Needed   Provider:   You may see Caron Poser, MD or one of the following Advanced Practice Providers on your designated Care Team:   Lonni Meager, NP Lesley Maffucci, PA-C Bernardino Bring, PA-C Cadence Kings, PA-C Tylene Lunch, NP Barnie Hila, NP    We recommend signing up for the patient portal called MyChart.  Sign up information is provided on this After Visit Summary.  MyChart is used to connect with patients for Virtual Visits (Telemedicine).  Patients are able to view lab/test results, encounter notes, upcoming appointments, etc.  Non-urgent messages can be sent to your provider as well.   To learn more about what you can do with MyChart, go to [email or web address].

## 2024-01-20 NOTE — Telephone Encounter (Signed)
 Error

## 2024-01-20 NOTE — Telephone Encounter (Signed)
 This RN attempted to contact pt with no answer. Will route to clinic HP.    Copied from CRM #[id number]. Topic: Clinical - Medication Question >> Jan 20, 2024  3:15 PM Frances Hoffman wrote: Reason for CRM: Patient called in stating that the sleep medication she is taking may be causing her mouth sores. Patient wants to know if there is anything else that she can take besides melatonin that she can take that can help with sleep. Patient can be reached at [phone].

## 2024-01-20 NOTE — Addendum Note (Signed)
 Addended by: HARL HERON DEL on: 01/20/2024 12:28 PM   Modules accepted: Orders

## 2024-01-21 NOTE — Telephone Encounter (Signed)
 LM for pt to return call.

## 2024-01-21 NOTE — Telephone Encounter (Signed)
 Ca we ask her what sleep medication she is taking? If she hasn't already have her stop .

## 2024-01-22 NOTE — Telephone Encounter (Signed)
 Spoke with pt and she states that she is taking Trazodone  50mg  at bedtime and it's causing sores on the inside of mouth. Melatonin does not work for her and she wants to know what else she can take to help her sleep.

## 2024-01-22 NOTE — Telephone Encounter (Signed)
 Spoke with pt and she states her mouth has been bothering her for a while. It's not so much sores as it is any time she drinks or eats anything her mouth will burn. She states she said something at her last appointment about this. Pt feels like this might not be the trazodone  after all.

## 2024-01-22 NOTE — Telephone Encounter (Signed)
 Are we sure trazodone  is causing the mouth sores? She has been taking this since may and did not have complaints then

## 2024-01-22 NOTE — Telephone Encounter (Signed)
 Copied from CRM [id number]. Topic: Clinical - Lab/Test Results >> Jan 22, 2024  9:03 AM Donna BRAVO wrote: Reason for CRM: patient retuning call from [place] Please call [phone]

## 2024-01-24 ENCOUNTER — Telehealth: Payer: Self-pay | Admitting: Family

## 2024-01-24 DIAGNOSIS — K146 Glossodynia: Secondary | ICD-10-CM

## 2024-01-24 NOTE — Telephone Encounter (Signed)
 Copied from CRM #[id number]. Topic: Clinical - Medication Question >> Jan 24, 2024  9:36 AM Viola F wrote: Reason for CRM: Patient seen Ginger Patrick 01/15/24 - spouse Vaughan thought that Ginger would be refilling the gabapentin  (NEURONTIN ) 300 MG capsule? Please call him at  [phone] to let him know if she's going to refill it or if they need to contact Dr. Iva.

## 2024-01-24 NOTE — Telephone Encounter (Signed)
 Okay to refill?

## 2024-01-27 ENCOUNTER — Other Ambulatory Visit: Payer: Self-pay | Admitting: Family

## 2024-01-27 DIAGNOSIS — K219 Gastro-esophageal reflux disease without esophagitis: Secondary | ICD-10-CM

## 2024-01-27 DIAGNOSIS — K146 Glossodynia: Secondary | ICD-10-CM

## 2024-01-27 MED ORDER — GABAPENTIN 300 MG PO CAPS: ORAL_CAPSULE | ORAL | 0 refills | Status: DC

## 2024-01-27 NOTE — Telephone Encounter (Signed)
 This would need to be filled by Dr. Iva however I can fill a 30 day until they can obtain the refill no problem.   She was supposed to f/u in October it looks like with Dr. Iva which would be a good idea considering she is still with the burning mouth symptoms.

## 2024-01-27 NOTE — Addendum Note (Signed)
 Addended by: CORWIN ANTU on: 01/27/2024 07:37 AM   Modules accepted: Orders

## 2024-01-27 NOTE — Telephone Encounter (Signed)
 Spoke with pt's husband. He is aware of Tabitha's message. Nothing further was needed.

## 2024-01-28 ENCOUNTER — Ambulatory Visit

## 2024-01-28 VITALS — Ht 64.0 in | Wt 160.0 lb

## 2024-01-28 DIAGNOSIS — Z0000 Encounter for general adult medical examination without abnormal findings: Secondary | ICD-10-CM

## 2024-01-28 NOTE — Patient Instructions (Signed)
 Ms. Durall,  Thank you for taking the time for your Medicare Wellness Visit. I appreciate your continued commitment to your health goals. Please review the care plan we discussed, and feel free to reach out if I can assist you further.  Please note that Annual Wellness Visits do not include a physical exam. Some assessments may be limited, especially if the visit was conducted virtually. If needed, we may recommend an in-person follow-up with your provider.  Ongoing Care Seeing your primary care provider every 3 to 6 months helps us  monitor your health and provide consistent, personalized care.   Referrals If a referral was made during today's visit and you haven't received any updates within two weeks, please contact the referred provider directly to check on the status.  Recommended Screenings:  Health Maintenance  Topic Date Due   Medicare Annual Wellness Visit  01/28/2024   COVID-19 Vaccine (7 - Pfizer risk 2025-26 season) 06/15/2024   Breast Cancer Screening  08/18/2024   DTaP/Tdap/Td vaccine (4 - Td or Tdap) 01/23/2032   Pneumococcal Vaccine for age over 67  Completed   Flu Shot  Completed   Osteoporosis screening with Bone Density Scan  Completed   Zoster (Shingles) Vaccine  Completed   Meningitis B Vaccine  Aged Out   Hepatitis C Screening  Discontinued       01/28/2024   10:33 AM  Advanced Directives  Does Patient Have a Medical Advance Directive? Yes  Type of Estate Agent of [place];Living will  Does patient want to make changes to medical advance directive? No - Patient declined  Copy of Healthcare Power of Attorney in Chart? No - copy requested    Vision: Annual vision screenings are recommended for early detection of glaucoma, cataracts, and diabetic retinopathy. These exams can also reveal signs of chronic conditions such as diabetes and high blood pressure.  Dental: Annual dental screenings help detect early signs of oral cancer, gum  disease, and other conditions linked to overall health, including heart disease and diabetes.  Please see the attached documents for additional preventive care recommendations.

## 2024-01-28 NOTE — Progress Notes (Signed)
 I connected with  Frances Hoffman on 01/28/24 by a audio enabled telemedicine application and verified that I am speaking with the correct person using two identifiers.  Patient Location: Home  Provider Location: Home Office  Persons Participating in Visit: Patient.  I discussed the limitations of evaluation and management by telemedicine. The patient expressed understanding and agreed to proceed.  Vital Signs: Because this visit was a virtual/telehealth visit, some criteria may be missing or patient reported. Any vitals not documented were not able to be obtained and vitals that have been documented are patient reported.   Because this visit was a virtual/telehealth visit,  certain criteria was not obtained, such a blood pressure, CBG if applicable, and timed get up and go. Any medications not marked as taking were not mentioned during the medication reconciliation part of the visit. Any vitals not documented were not able to be obtained due to this being a telehealth visit or patient was unable to self-report a recent blood pressure reading due to a lack of equipment at home via telehealth. Vitals that have been documented are verbally provided by the patient.   This visit was performed by a medical professional under my direct supervision. I was immediately available for consultation/collaboration. I have reviewed and agree with the Annual Wellness Visit documentation.  Chief Complaint  Patient presents with   Medicare Wellness     Subjective:   Frances Hoffman is a 83 y.o. female who presents for a Medicare Annual Wellness Visit.  Allergies (verified) Ciprofloxacin , Citalopram  hydrobromide, Flagyl  [metronidazole ], Milk-related compounds, Oxycodone , and Trazodone  and nefazodone   History: Past Medical History:  Diagnosis Date   Adjustment disorder with mixed anxiety and depressed mood 12/12/2007   Qualifier: Diagnosis of  By: Copland MD, Spencer     B12 deficiency    Breast  cancer (HCC)    Cancer (HCC) 2000   breast had lumpectomy/radiation x36,mammo ,no chemo   Cataract of both eyes    Depression    Dysuria-frequency syndrome    takes AZO   Family history of heart disease 08/27/2013   Frozen shoulder    Gall stones    GERD 12/12/2007   Qualifier: Diagnosis of  By: Copland MD, Spencer     Hyperlipidemia    Hypothyroidism 12/12/2007   Qualifier: Diagnosis of  By: Watt MD, Spencer     Lactose intolerance    Memory difficulties 09/27/2017   Memory loss    Memory loss 09/22/2014   Osteopenia    BMD 2004, WNL 2008   Personal history of radiation therapy    Pneumonia    Recurrent UTI    Shingles    chronic body pain, left side of body   Shortness of breath dyspnea    when climbing stairs only   Vitamin D  deficiency    Wears glasses    Past Surgical History:  Procedure Laterality Date   BREAST LUMPECTOMY Left 2000   CHOLECYSTECTOMY N/A 02/21/2015   Procedure: LAPAROSCOPIC CHOLECYSTECTOMY WITH INTRAOPERATIVE CHOLANGIOGRAM;  Surgeon: Camellia Blush, MD;  Location: [facility] OR;  Service: General;  Laterality: N/A;   COLONOSCOPY     MULTIPLE TOOTH EXTRACTIONS     Family History  Problem Relation Age of Onset   Osteoporosis Mother    Hypertension Mother    Sleep apnea Mother    Obesity Mother    Parkinsonism Father    Breast cancer Sister 15   Coronary artery disease Sister    Ovarian cancer Sister  Heart disease Son    Heart attack Son 48   Heart disease Maternal Grandmother    Heart disease Maternal Grandfather    Heart attack Maternal Grandfather 98   Breast cancer Other    Social History   Occupational History   Not on file  Tobacco Use   Smoking status: Never   Smokeless tobacco: Never  Vaping Use   Vaping status: Never Used  Substance and Sexual Activity   Alcohol  use: No    Alcohol /week: 0.0 standard drinks of alcohol    Drug use: No   Sexual activity: Yes    Partners: Male    Birth control/protection: Post-menopausal    Tobacco Counseling Counseling given: Not Answered  SDOH Screenings   Food Insecurity: No Food Insecurity (01/28/2024)  Housing: Low Risk  (01/28/2024)  Transportation Needs: No Transportation Needs (01/28/2024)  Utilities: Not At Risk (01/28/2024)  Alcohol  Screen: Low Risk  (01/28/2023)  Depression (PHQ2-9): Low Risk  (01/28/2024)  Recent Concern: Depression (PHQ2-9) - Medium Risk (01/03/2024)  Financial Resource Strain: Low Risk  (01/28/2023)  Physical Activity: Insufficiently Active (01/28/2024)  Social Connections: Moderately Integrated (01/28/2024)  Stress: No Stress Concern Present (01/28/2024)  Tobacco Use: Low Risk  (01/28/2024)  Health Literacy: Adequate Health Literacy (01/28/2024)   See flowsheets for full screening details  Depression Screen PHQ 2 & 9 Depression Scale- Over the past 2 weeks, how often have you been bothered by any of the following problems? Little interest or pleasure in doing things: 0 Feeling down, depressed, or hopeless (PHQ Adolescent also includes...irritable): 1 PHQ-2 Total Score: 1 Trouble falling or staying asleep, or sleeping too much: 1 Feeling tired or having little energy: 0 Poor appetite or overeating (PHQ Adolescent also includes...weight loss): 0 Feeling bad about yourself - or that you are a failure or have let yourself or your family down: 1 Trouble concentrating on things, such as reading the newspaper or watching television (PHQ Adolescent also includes...like school work): 0 Moving or speaking so slowly that other people could have noticed. Or the opposite - being so fidgety or restless that you have been moving around a lot more than usual: 0 Thoughts that you would be better off dead, or of hurting yourself in some way: 0 PHQ-9 Total Score: 3 If you checked off any problems, how difficult have these problems made it for you to do your work, take care of things at home, or get along with other people?: Not difficult at  all  Depression Treatment Depression Interventions/Treatment : Counseling     Goals Addressed             This Visit's Progress    Patient Stated       She would like to take a trip       Visit info / Clinical Intake: Medicare Wellness Visit Type:: Subsequent Annual Wellness Visit Persons participating in visit:: patient Medicare Wellness Visit Mode:: Telephone If telephone:: video declined Because this visit was a virtual/telehealth visit:: pt reported vitals If Telephone or Video please confirm:: I connected with the patient using audio enabled telemedicine application and verified that I am speaking with the correct person using two identifiers; I discussed the limitations of evaluation and management by telemedicine; The patient expressed understanding and agreed to proceed Patient Location:: home Provider Location:: home office Information given by:: patient Interpreter Needed?: No Pre-visit prep was completed: yes AWV questionnaire completed by patient prior to visit?: no Living arrangements:: lives with spouse/significant other Patient's Overall Health Status Rating:  very good Typical amount of pain: none Does pain affect daily life?: no Are you currently prescribed opioids?: no  Dietary Habits and Nutritional Risks How many meals a day?: 3 Eats fruit and vegetables daily?: yes Most meals are obtained by: preparing own meals In the last 2 weeks, have you had any of the following?: none Diabetic:: no  Functional Status Activities of Daily Living (to include ambulation/medication): Independent Ambulation: Independent Medication Administration: Independent Home Management: Independent Manage your own finances?: yes Primary transportation is: driving Concerns about vision?: no *vision screening is required for WTM* Concerns about hearing?: no  Fall Screening Falls in the past year?: 1 Number of falls in past year: 0 Was there an injury with Fall?: 0 Fall  Risk Category Calculator: 1 Patient Fall Risk Level: Low Fall Risk  Fall Risk Patient at Risk for Falls Due to: History of fall(s) Fall risk Follow up: Falls evaluation completed  Home and Transportation Safety: All rugs have non-skid backing?: yes All stairs or steps have railings?: N/A, no stairs Grab bars in the bathtub or shower?: yes Have non-skid surface in bathtub or shower?: yes Good home lighting?: yes Regular seat belt use?: (!) no Hospital stays in the last year:: no  Cognitive Assessment Difficulty concentrating, remembering, or making decisions? : yes Will 6CIT or Mini Cog be Completed: no 6CIT or Mini Cog Declined: patient has a diagnosis of dementia or cognitive impairment What year is it?: 0 points What month is it?: 0 points Give patient an address phrase to remember (5 components): [id number] [place] what time is it?: 0 points Count backwards from 20 to 1: 0 points Say the months of the year in reverse: 4 points Repeat the address phrase from earlier: 6 points 6 CIT Score: 10 points  Advance Directives (For Healthcare) Does Patient Have a Medical Advance Directive?: Yes Does patient want to make changes to medical advance directive?: No - Patient declined Type of Advance Directive: Healthcare Power of [place]; Living will Copy of Healthcare Power of Attorney in Chart?: No - copy requested Copy of Living Will in Chart?: No - copy requested  Reviewed/Updated  Reviewed/Updated: Reviewed All (Medical, Surgical, Family, Medications, Allergies, Care Teams, Patient Goals)        Objective:    Today's Vitals   01/28/24 1030  Weight: 160 lb (72.6 kg)  Height: 5' 4 (1.626 m)   Body mass index is 27.46 kg/m.  Current Medications (verified) Outpatient Encounter Medications as of 01/28/2024  Medication Sig   acetaminophen  (TYLENOL ) 500 MG tablet Take 500 mg by mouth every 6 (six) hours as needed.   aspirin  EC 81 MG tablet Take 81 mg by  mouth daily. Swallow whole.   atorvastatin  (LIPITOR) 80 MG tablet Take 1 tablet (80 mg total) by mouth daily.   azelastine  (ASTELIN ) 0.1 % nasal spray Place 2 sprays into both nostrils 2 (two) times daily. Use in each nostril as directed   calcium  carbonate (OS-CAL - DOSED IN MG OF ELEMENTAL CALCIUM ) 1250 (500 CA) MG tablet Take 1 tablet by mouth 2 (two) times daily with a meal.   carboxymethylcellulose (REFRESH PLUS) 0.5 % SOLN Place 1 drop into both eyes 3 (three) times daily as needed (for dryness).   Cholecalciferol  (VITAMIN D3) 2000 UNITS capsule Take 2,000 Units by mouth daily.   COLESTID 1 g tablet Take 2 g by mouth 2 (two) times daily. For 30 days   escitalopram  (LEXAPRO ) 20 MG tablet Take 1 tablet (20  mg total) by mouth daily.   fludrocortisone  (FLORINEF ) 0.1 MG tablet Take 1 tablet by mouth once daily   gabapentin  (NEURONTIN ) 300 MG capsule Start one tablet at night since this can cause sleepiness. Can increase to one tablet three times daily.   levothyroxine  (SYNTHROID ) 88 MCG tablet Take 1 tablet by mouth once daily   loperamide (IMODIUM A-D) 2 MG capsule Take 2 mg by mouth daily as needed.   meclizine  (ANTIVERT ) 50 MG tablet Take 1 tablet (50 mg total) by mouth 3 (three) times daily as needed.   memantine  (NAMENDA ) 10 MG tablet Take 1 tablet by mouth twice daily   metFORMIN  (GLUCOPHAGE ) 500 MG tablet Take 1 tablet (500 mg total) by mouth 2 (two) times daily with a meal.   pantoprazole  (PROTONIX ) 20 MG tablet Take 1 tablet by mouth once daily   No facility-administered encounter medications on file as of 01/28/2024.   Hearing/Vision screen Hearing Screening - Comments:: No hearing issues  Vision Screening - Comments:: Patient wears reading glasses  Immunizations and Health Maintenance Health Maintenance  Topic Date Due   COVID-19 Vaccine (7 - Pfizer risk 2025-26 season) 06/15/2024   Mammogram  08/18/2024   Medicare Annual Wellness (AWV)  01/27/2025   DTaP/Tdap/Td (4 - Td or  Tdap) 01/23/2032   Pneumococcal Vaccine: 50+ Years  Completed   Influenza Vaccine  Completed   Bone Density Scan  Completed   Zoster Vaccines- Shingrix  Completed   Meningococcal B Vaccine  Aged Out   Hepatitis C Screening  Discontinued        Assessment/Plan:  This is a routine wellness examination for Myrikal.  Patient Care Team: Corwin Antu, FNP as PCP - General (Family Medicine) Argentina Clap, MD as PCP - Cardiology (Cardiology) [facility]  I have personally reviewed and noted the following in the patient's chart:   Medical and social history Use of alcohol , tobacco or illicit drugs  Current medications and supplements including opioid prescriptions. Functional ability and status Nutritional status Physical activity Advanced directives List of other physicians Hospitalizations, surgeries, and ER visits in previous 12 months Vitals Screenings to include cognitive, depression, and falls Referrals and appointments  No orders of the defined types were placed in this encounter.  In addition, I have reviewed and discussed with patient certain preventive protocols, quality metrics, and best practice recommendations. A written personalized care plan for preventive services as well as general preventive health recommendations were provided to patient.   Lyle MARLA Right, CMA   01/28/2024   Return in 1 year (on 01/27/2025).  After Visit Summary: (MyChart) Due to this being a telephonic visit, the after visit summary with patients personalized plan was offered to patient via MyChart   Nurse Notes: Nothing to report

## 2024-02-11 ENCOUNTER — Encounter: Admitting: Internal Medicine

## 2024-02-12 ENCOUNTER — Ambulatory Visit: Admitting: Internal Medicine

## 2024-02-13 ENCOUNTER — Ambulatory Visit

## 2024-02-13 ENCOUNTER — Ambulatory Visit: Attending: Internal Medicine | Admitting: Internal Medicine

## 2024-02-13 ENCOUNTER — Encounter: Payer: Self-pay | Admitting: Internal Medicine

## 2024-02-13 VITALS — BP 81/54 | HR 80 | Temp 96.7°F | Resp 16 | Ht 63.5 in | Wt 162.0 lb

## 2024-02-13 DIAGNOSIS — R7689 Other specified abnormal immunological findings in serum: Secondary | ICD-10-CM

## 2024-02-13 DIAGNOSIS — N1831 Chronic kidney disease, stage 3a: Secondary | ICD-10-CM | POA: Diagnosis not present

## 2024-02-13 DIAGNOSIS — K146 Glossodynia: Secondary | ICD-10-CM

## 2024-02-13 DIAGNOSIS — M79641 Pain in right hand: Secondary | ICD-10-CM

## 2024-02-13 DIAGNOSIS — M85851 Other specified disorders of bone density and structure, right thigh: Secondary | ICD-10-CM

## 2024-02-13 DIAGNOSIS — M79642 Pain in left hand: Secondary | ICD-10-CM | POA: Diagnosis not present

## 2024-02-13 NOTE — Assessment & Plan Note (Addendum)
 Chronic osteoarthritis with pain and limited range of motion. Positive rheumatoid factor, no significant rheumatoid arthritis changes evident on exam and no peripherla synovitis at present. Possible mainly OA. Small cyst on thumb joint. Pain managed with topical treatments and Tylenol . - Ordered blood test for rheumatoid arthritis activity. - Ordered x-ray of hands for osteoarthritis evaluation. - Consider hydroxychloroquine if rheumatoid arthritis activity is confirmed. - Continue pain management with topical treatments and Tylenol .  Orders:   Sedimentation rate   C3 and C4   Rheumatoid factor   Cyclic citrul peptide antibody, IgG   XR Hand 2 View Right   XR Hand 2 View Left

## 2024-02-13 NOTE — Patient Instructions (Signed)
 SABRA

## 2024-02-13 NOTE — Assessment & Plan Note (Addendum)
 Chronic dry mouth and burning tongue for over ten years. Previous treatments ineffective. Symptoms exacerbated by [employer].  Not currently severe enough that she wants to start new long-term medications for neuropathic pain or secretagogue. - Continue current management and monitor symptoms.

## 2024-02-13 NOTE — Progress Notes (Signed)
 "  Office Visit Note  Patient: Frances Hoffman             Date of Birth: 1940-12-27           MRN: [id number]             PCP: Corwin Antu, FNP Referring: Iva Marty Saltness, MD Visit Date: 02/13/2024 Occupation: Retired  Subjective:  New Patient (Initial Visit) (Arthritis in the hands and osteopenia )  Discussed the use of AI scribe software for clinical note transcription with the patient, who gave verbal consent to proceed.  History of Present Illness   HAILEIGH Hoffman is an 83 year old female with a positive rheumatoid factor who presents with hand and shoulder pain.  She experiences persistent hand and shoulder pain, described as aching and painful with movement, present daily and exacerbated by activities. 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 She recalls a past finger injury but denies other specific injuries like rotator cuff injuries or dislocations. No recent changes in symptoms are noted.  She reports pain in her shoulders and arms, particularly in the shoulders, and states it has been present for a significant duration. She has not sought recent medical attention for these symptoms. No swelling in her hands or legs is reported. For hand pain, she uses a topical treatment called [employer] and takes Tylenol  frequently, though she is not inclined to take pills. She has not received joint injections or other specific arthritis treatments. Her hand pain limits her ability to knit, an activity she previously enjoyed.  She reports a long-standing issue with dry mouth and a burning sensation in her mouth, persisting for years. Various treatments, including a prescription mouthwash and a solution mixed with water, have been ineffective. Drinking milk causes small holes in the roof of her mouth, a condition present since childhood, but she does not experience similar issues with other dairy products like ice cream.         Activities of Daily Living:  Patient reports morning stiffness for 2-3 minutes.   Patient Denies nocturnal pain.  Difficulty dressing/grooming: Denies Difficulty climbing stairs: Reports Difficulty getting out of chair: Denies Difficulty using hands for taps, buttons, cutlery, and/or writing: Reports  Review of Systems  Constitutional:  Positive for fatigue.  HENT:  Positive for mouth dryness. Negative for mouth sores.   Eyes:  Negative for dryness.  Respiratory:  Negative for shortness of breath.   Cardiovascular:  Negative for chest pain and palpitations.  Gastrointestinal:  Positive for constipation and diarrhea. Negative for blood in stool.  Endocrine: Negative for increased urination.  Genitourinary:  Negative for involuntary urination.  Musculoskeletal:  Positive for joint pain, gait problem, joint pain and morning stiffness. Negative for joint swelling, myalgias, muscle weakness, muscle tenderness and myalgias.  Skin:  Negative for color change, rash, hair loss and sensitivity to sunlight.  Allergic/Immunologic: Negative for susceptible to infections.  Neurological:  Positive for dizziness. Negative for headaches.  Hematological:  Negative for swollen glands.  Psychiatric/Behavioral:  Positive for depressed mood and sleep disturbance. The patient is nervous/anxious.     PMFS History:  Patient Active Problem List   Diagnosis Date Noted   Rheumatoid factor positive 02/13/2024   Bilateral hand pain 02/13/2024   Dry mouth 06/10/2023   Otalgia of right ear 06/10/2023   Abnormal sense of taste 06/10/2023   Excessive daytime sleepiness 03/26/2023   Burning mouth syndrome 01/21/2023   Decreased  GFR 01/21/2023   Dizziness 01/21/2023   Chronic renal impairment 01/01/2023   BMI 31.0-31.9,adult 12/20/2022   Eustachian tube dysfunction, right 07/26/2021   Seasonal allergic rhinitis due to pollen 07/06/2021   Irritable bowel syndrome with diarrhea 03/17/2019   B12  deficiency 02/04/2018   Xerostomia 01/23/2018   CKD (chronic kidney disease) stage 3, GFR 30-59 ml/min (HCC) 11/28/2017   Vascular dementia without behavioral disturbance (HCC) 09/27/2017   Prediabetes 09/27/2017   Cognitive impairment 07/02/2017   Cerebral vascular disease 07/02/2017   Chronic insomnia 07/02/2017   Primary osteoarthritis of first carpometacarpal joint of left hand 01/09/2017   History of breast cancer 11/14/2016   Obesity, Class I, BMI 30-34.9 08/19/2016   MDD (major depressive disorder), recurrent episode, moderate (HCC) 12/23/2015   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   Orthostatic hypotension 09/25/2015   Fecal incontinence 07/29/2015   Exercise intolerance 07/30/2014   Positional vertigo 08/18/2013   Fatigue 08/15/2009   Osteopenia 11/26/2008   Recurrent UTI 01/15/2008   Hypothyroidism 12/12/2007   Hyperlipidemia 12/12/2007   Chronic GERD 12/12/2007    Past Medical History:  Diagnosis Date   Adjustment disorder with mixed anxiety and depressed mood 12/12/2007   Qualifier: Diagnosis of  By: Copland MD, Spencer     B12 deficiency    Breast cancer (HCC)    Cancer (HCC) 2000   breast had lumpectomy/radiation x36,mammo ,no chemo   Cataract of both eyes    Depression    Dysuria-frequency syndrome    takes AZO   Family history of heart disease 08/27/2013   Frozen shoulder    Gall stones    GERD 12/12/2007   Qualifier: Diagnosis of  By: Copland MD, Spencer     Hyperlipidemia    Hypothyroidism 12/12/2007   Qualifier: Diagnosis of  By: Watt MD, Spencer     Lactose intolerance    Memory difficulties 09/27/2017   Memory loss    Memory loss 09/22/2014   Osteopenia    BMD 2004, WNL 2008   Personal history of radiation therapy    Pneumonia    Recurrent UTI    Shingles    chronic body pain, left side of body   Shortness of breath dyspnea    when climbing stairs only   Vitamin D  deficiency    Wears glasses     Family History  Problem Relation  Age of Onset   Osteoporosis Mother    Hypertension Mother    Sleep apnea Mother    Obesity Mother    Parkinsonism Father    Breast cancer Sister 16   Coronary artery disease Sister    Ovarian cancer Sister    Heart disease Maternal Grandmother    Heart disease Maternal Grandfather    Heart attack Maternal Grandfather 56   Heart disease Son    Heart attack Son 36   Healthy Son    Breast cancer Other    Past Surgical History:  Procedure Laterality Date   BREAST LUMPECTOMY Left 2000   CHOLECYSTECTOMY N/A 02/21/2015   Procedure: LAPAROSCOPIC CHOLECYSTECTOMY WITH INTRAOPERATIVE CHOLANGIOGRAM;  Surgeon: Camellia Blush, MD;  Location: [facility] OR;  Service: General;  Laterality: N/A;   COLONOSCOPY     MULTIPLE TOOTH EXTRACTIONS     Social History[1] Social History   Social History Narrative   Lives at home with her husband. GLENWOOD Clark   2 adult sons  - Osage and Camellia   2 Grandchildren - both other   Exercise: goes to [employer]  [employer]ne[employer] - at  least 2 times a week   Diet: anything that tastes good, less pepsi's with the tongue   Enjoys: square dance, eating out with friends, going to church - Sunday and Wednesday     Immunization History  Administered Date(s) Administered   Fluad Quad(high Dose 65+) 12/15/2019, 11/01/2021   INFLUENZA, HIGH DOSE SEASONAL PF 12/11/2017, 11/27/2018, 12/16/2023   Influenza Split 01/04/2011   Influenza Whole 01/03/2009, 12/06/2009   Influenza,inj,Quad PF,6+ Mos 12/02/2012, 11/30/2014, 11/30/2015   Influenza-Unspecified 12/04/2013, 12/09/2020   PFIZER Comirnaty(Gray Top)Covid-19 Tri-Sucrose Vaccine 11/01/2021   PFIZER(Purple Top)SARS-COV-2 Vaccination 03/24/2019, 04/14/2019, 12/15/2019, 12/09/2020   PNEUMOCOCCAL CONJUGATE-20 11/22/2021   PPD Test 12/31/2017   Pneumococcal Conjugate-13 04/28/2014   Pneumococcal Polysaccharide-23 11/26/2008   Respiratory Syncytial Virus Vaccine,Recomb Aduvanted(Arexvy) 11/01/2021   Td 03/05/2006   Tdap 11/06/2016, 01/22/2022    Unspecified SARS-COV-2 Vaccination 12/16/2023   Zoster Recombinant(Shingrix) 01/16/2019, 05/01/2019   Zoster, Live 04/05/2013     Objective: Vital Signs: BP (!) 81/54 (BP Location: Left Arm, Patient Position: Sitting, Cuff Size: Normal)   Pulse 80   Temp (!) 96.7 F (35.9 C)   Resp 16   Ht 5' 3.5 (1.613 m)   Wt 162 lb (73.5 kg)   BMI 28.25 kg/m    Physical Exam Eyes:     Conjunctiva/sclera: Conjunctivae normal.  Cardiovascular:     Rate and Rhythm: Normal rate and regular rhythm.  Pulmonary:     Effort: Pulmonary effort is normal.     Breath sounds: Normal breath sounds.  Lymphadenopathy:     Cervical: No cervical adenopathy.  Skin:    General: Skin is warm and dry.     Comments: Very dry skin on hands No digital pitting or nail changes  Neurological:     Mental Status: She is alert.  Psychiatric:        Mood and Affect: Mood normal.      Musculoskeletal Exam:  Left shoulder restricted range of motion, right full range of motion but with increased pain Wrists full ROM no tenderness or swelling Fingers right thumb partial distal phalanx amputation, third finger status post trigger release no paraspinal tenderness to palpation over upper and lower back Knees full ROM no tenderness or swelling, b/l crepitus Ankles full ROM no tenderness or swelling    Investigation: No additional findings.  Imaging: CT HEAD WO CONTRAST ( ) Result Date: 01/15/2024 EXAM: CT HEAD WITHOUT CONTRAST 01/15/2024 01:14:18 PM TECHNIQUE: CT of the head was performed without the administration of intravenous contrast. Automated exposure control, iterative reconstruction, and/or weight based adjustment of the mA/kV was utilized to reduce the radiation dose to as low as reasonably achievable. COMPARISON: MRI brain 02/22/2023. CLINICAL HISTORY: Head trauma, minor (Age >= 65y). FINDINGS: BRAIN AND VENTRICLES: No acute hemorrhage. No evidence of acute infarct. Redemonstrated remote infarct in  the inferior aspect of the left cerebellum. Moderate chronic microvascular ischemic change. No hydrocephalus. No extra-axial collection. No mass effect or midline shift. ORBITS: Bilateral lens replacement. SINUSES: No acute abnormality. SOFT TISSUES AND SKULL: Bilateral TMJ degenerative changes. No acute soft tissue abnormality. No skull fracture. IMPRESSION: 1. No acute intracranial abnormality. 2. Redemonstrated remote infarct in the inferior left cerebellum. 3. Moderate chronic microvascular ischemic change. 4. Bilateral temporomandibular joint degenerative changes. Electronically signed by: Donnice Mania MD 01/15/2024 02:20 PM EST RP Workstation: [id number]    Recent Labs: Lab Results  Component Value Date   WBC 7.3 01/15/2024   HGB 13.5 01/15/2024   PLT 237.0 01/15/2024   NA 143 01/15/2024  K 3.7 01/15/2024   CL 100 01/15/2024   CO2 34 (H) 01/15/2024   GLUCOSE 103 (H) 01/15/2024   BUN 12 01/15/2024   CREATININE 1.14 01/15/2024   BILITOT 0.6 03/26/2023   ALKPHOS 91 03/26/2023   AST 19 03/26/2023   ALT 14 03/26/2023   PROT 6.6 03/26/2023   ALBUMIN 4.0 03/26/2023   CALCIUM  9.5 01/15/2024   GFRAA 67 12/31/2017    Speciality Comments: No specialty comments available.  Procedures:  No procedures performed Allergies: Ciprofloxacin , Citalopram  hydrobromide, Flagyl  [metronidazole ], Milk-related compounds, Oxycodone , and Trazodone  and nefazodone   Assessment / Plan:     Visit Diagnoses:  Assessment & Plan Rheumatoid factor positive Bilateral hand pain Chronic osteoarthritis with pain and limited range of motion. Positive rheumatoid factor, no significant rheumatoid arthritis changes evident on exam and no peripherla synovitis at present. Possible mainly OA. Small cyst on thumb joint. Pain managed with topical treatments and Tylenol . - Ordered blood test for rheumatoid arthritis activity. - Ordered x-ray of hands for osteoarthritis evaluation. - Consider hydroxychloroquine if  rheumatoid arthritis activity is confirmed. - Continue pain management with topical treatments and Tylenol .  Orders:   Sedimentation rate   C3 and C4   Rheumatoid factor   Cyclic citrul peptide antibody, IgG   XR Hand 2 View Right   XR Hand 2 View Left  Burning mouth syndrome Chronic dry mouth and burning tongue for over ten years. Previous treatments ineffective. Symptoms exacerbated by Pepsi.  Not currently severe enough that she wants to start new long-term medications for neuropathic pain or secretagogue. - Continue current management and monitor symptoms.     Stage 3a chronic kidney disease (HCC) Limiting long term NSAID use for arthritis symptom management.    Osteopenia of neck of right femur  Bone density indicates osteopenia with a T-score between -1 and -2.5. No fracture history. 20% fracture risk in ten years, potentially reduced by a third with medication. Treatment may not be beneficial due to age and lack of fractures. - Provided information on bone health, diet, vitamin D , and exercise. - Encouraged activity to prevent falls and fractures.      Follow-Up Instructions: Return in about 1 year (around 02/12/2025) for New pt +RF obs f/u 37yr.   Lonni LELON Ester, MD  Note - This record has been created using [employer].  Chart creation errors have been sought, but may not always  have been located. Such creation errors do not reflect on  the standard of medical care.      [1]  Social History Tobacco Use   Smoking status: Former    Types: Cigarettes    Passive exposure: Past   Smokeless tobacco: Never   Tobacco comments:    Tried it in college  Vaping Use   Vaping status: Never Used  Substance Use Topics   Alcohol  use: No    Alcohol /week: 0.0 standard drinks of alcohol    Drug use: No   "

## 2024-02-13 NOTE — Assessment & Plan Note (Addendum)
 Limiting long term NSAID use for arthritis symptom management.

## 2024-02-13 NOTE — Assessment & Plan Note (Addendum)
" °  Bone density indicates osteopenia with a T-score between -1 and -2.5. No fracture history. 20% fracture risk in ten years, potentially reduced by a third with medication. Treatment may not be beneficial due to age and lack of fractures. - Provided information on bone health, diet, vitamin D , and exercise. - Encouraged activity to prevent falls and fractures.    "

## 2024-02-17 ENCOUNTER — Telehealth: Payer: Self-pay | Admitting: *Deleted

## 2024-02-17 LAB — C3 AND C4
C3 Complement: 135 mg/dL
C4 Complement: 27 mg/dL

## 2024-02-17 LAB — CYCLIC CITRUL PEPTIDE ANTIBODY, IGG: Cyclic Citrullin Peptide Ab: <16 U

## 2024-02-17 LAB — RHEUMATOID FACTOR: Rheumatoid fact SerPl-aCnc: 25 [IU]/mL — ABNORMAL HIGH (ref <14)

## 2024-02-17 LAB — SEDIMENTATION RATE: Sed Rate: 14 mm/h (ref 0–30)

## 2024-02-17 NOTE — Telephone Encounter (Signed)
 Patient's husband Vaughan advised CCP antibody is still in process. Her rheumatoid factor is positive slightly higher than before. Her hand xrays show calcium  deposition in multiple joints which can indicate CPPD (pseudogout) as a cause of joint pain or swelling. There is also some regular wear and tear especially of thumbs. Blood tests do not indicate active inflammation at this time. So Dr. Jeannetta does not think she needs to start plaquenil right now unless she experiences a flare up or worsening symptoms. Otherwise we could monitor and consider f/u in 6-12 months for her abnormal labs.

## 2024-02-17 NOTE — Telephone Encounter (Signed)
 CCP antibody is still in process. Her rheumatoid factor is positive slightly higher than before. Her hand xrays show calcium  deposition in multiple joints which can indicate CPPD (pseudogout) as a cause of joint pain or swelling. There is also some regular wear and tear especially of thumbs. Blood tests do not indicate active inflammation at this time. So I do not think she needs to start plaquenil right now unless she experiences a flare up or worsening symptoms. Otherwise we could monitor and consider f/u in 6-12 months for her abnormal labs.

## 2024-02-17 NOTE — Telephone Encounter (Signed)
 Patient's husband contacted the office inquiring about the information on PLQ in the after visit summary. He was uncertain if the plan was to start the patient on this medication. He would also like the patient's lab results.

## 2024-02-19 DIAGNOSIS — R42 Dizziness and giddiness: Secondary | ICD-10-CM

## 2024-02-24 ENCOUNTER — Ambulatory Visit: Payer: Self-pay

## 2024-02-26 ENCOUNTER — Other Ambulatory Visit: Payer: Self-pay | Admitting: *Deleted

## 2024-02-26 MED ORDER — METOPROLOL SUCCINATE ER 25 MG PO TB24: 25.0000 mg | ORAL_TABLET | Freq: Every day | ORAL | 3 refills | Status: AC

## 2024-03-23 ENCOUNTER — Telehealth: Payer: Self-pay | Admitting: Family

## 2024-03-23 ENCOUNTER — Other Ambulatory Visit: Payer: Self-pay | Admitting: Family

## 2024-03-23 DIAGNOSIS — K219 Gastro-esophageal reflux disease without esophagitis: Secondary | ICD-10-CM

## 2024-03-23 DIAGNOSIS — K146 Glossodynia: Secondary | ICD-10-CM

## 2024-03-23 NOTE — Telephone Encounter (Unsigned)
 Copied from CRM [id number]. Topic: Clinical - Medication Question >> Mar 23, 2024  9:50 AM Mercedes MATSU wrote: Reason for CRM: Patients husband Khilee Hendricksen called on behalf of the patient stating that she was prescribed two medications and they are unsure as to what they are for. Patient called in for a refill of the medication, but also needed a detailed explanation on what they are needed for. Patients husband Mr. Kavanagh is requesting a call back at [phone].

## 2024-03-23 NOTE — Telephone Encounter (Unsigned)
 Copied from CRM [id number]. Topic: Clinical - Medication Refill >> Mar 23, 2024  9:47 AM [name] wrote: Medication: gabapentin  (NEURONTIN ) 300 MG capsule pantoprazole  (PROTONIX ) 20 MG tablet    Has the patient contacted their pharmacy? Yes (Agent: If no, request that the patient contact the pharmacy for the refill. If patient does not wish to contact the pharmacy document the reason why and proceed with request.) (Agent: If yes, when and what did the pharmacy advise?)  This is the patient's preferred pharmacy:  [facility] [address] [place], [place] - [address] [place] [address] Phone: [phone] Fax: [phone]  Is this the correct pharmacy for this prescription? Yes If no, delete pharmacy and type the correct one.   Has the prescription been filled recently? Yes  Is the patient out of the medication? Yes  Has the patient been seen for an appointment in the last year OR does the patient have an upcoming appointment? Yes  Can we respond through MyChart? Yes  Agent: Please be advised that Rx refills may take up to 3 business days. We ask that you follow-up with your pharmacy.

## 2024-03-24 MED ORDER — PANTOPRAZOLE SODIUM 20 MG PO TBEC: 20.0000 mg | DELAYED_RELEASE_TABLET | Freq: Every day | ORAL | 5 refills | Status: AC

## 2024-03-24 MED ORDER — GABAPENTIN 300 MG PO CAPS: ORAL_CAPSULE | ORAL | 0 refills | Status: AC

## 2024-03-24 NOTE — Telephone Encounter (Signed)
 There is already a phone call open about this. Pt is needing refills on gabapentin  and pantoprazole .

## 2024-04-01 ENCOUNTER — Other Ambulatory Visit: Payer: Self-pay | Admitting: Family

## 2024-04-01 DIAGNOSIS — E039 Hypothyroidism, unspecified: Secondary | ICD-10-CM
